# Patient Record
Sex: Female | Born: 1955 | Race: White | Hispanic: No | Marital: Married | Smoking: Former smoker
Health system: Southern US, Community
[De-identification: ages and names within clinical notes are randomized; demographics above are authoritative.]

## PROBLEM LIST (undated history)

## (undated) DIAGNOSIS — I341 Nonrheumatic mitral (valve) prolapse: Secondary | ICD-10-CM

## (undated) DIAGNOSIS — E039 Hypothyroidism, unspecified: Secondary | ICD-10-CM

## (undated) DIAGNOSIS — Z992 Dependence on renal dialysis: Secondary | ICD-10-CM

## (undated) DIAGNOSIS — M797 Fibromyalgia: Secondary | ICD-10-CM

## (undated) DIAGNOSIS — E11319 Type 2 diabetes mellitus with unspecified diabetic retinopathy without macular edema: Secondary | ICD-10-CM

## (undated) DIAGNOSIS — R609 Edema, unspecified: Secondary | ICD-10-CM

## (undated) DIAGNOSIS — M810 Age-related osteoporosis without current pathological fracture: Secondary | ICD-10-CM

## (undated) DIAGNOSIS — D649 Anemia, unspecified: Secondary | ICD-10-CM

## (undated) DIAGNOSIS — N189 Chronic kidney disease, unspecified: Secondary | ICD-10-CM

## (undated) DIAGNOSIS — R519 Headache, unspecified: Secondary | ICD-10-CM

## (undated) DIAGNOSIS — T8859XA Other complications of anesthesia, initial encounter: Secondary | ICD-10-CM

## (undated) DIAGNOSIS — I509 Heart failure, unspecified: Secondary | ICD-10-CM

## (undated) DIAGNOSIS — B6000 Babesiosis, unspecified: Secondary | ICD-10-CM

## (undated) DIAGNOSIS — I219 Acute myocardial infarction, unspecified: Secondary | ICD-10-CM

## (undated) DIAGNOSIS — R51 Headache: Secondary | ICD-10-CM

## (undated) DIAGNOSIS — K219 Gastro-esophageal reflux disease without esophagitis: Secondary | ICD-10-CM

## (undated) DIAGNOSIS — E119 Type 2 diabetes mellitus without complications: Secondary | ICD-10-CM

## (undated) DIAGNOSIS — F32A Depression, unspecified: Secondary | ICD-10-CM

## (undated) DIAGNOSIS — M1990 Unspecified osteoarthritis, unspecified site: Secondary | ICD-10-CM

## (undated) DIAGNOSIS — D631 Anemia in chronic kidney disease: Secondary | ICD-10-CM

## (undated) DIAGNOSIS — N186 End stage renal disease: Secondary | ICD-10-CM

## (undated) DIAGNOSIS — G629 Polyneuropathy, unspecified: Secondary | ICD-10-CM

## (undated) DIAGNOSIS — G4730 Sleep apnea, unspecified: Secondary | ICD-10-CM

## (undated) DIAGNOSIS — Z8489 Family history of other specified conditions: Secondary | ICD-10-CM

## (undated) DIAGNOSIS — A6920 Lyme disease, unspecified: Secondary | ICD-10-CM

## (undated) DIAGNOSIS — I639 Cerebral infarction, unspecified: Secondary | ICD-10-CM

## (undated) DIAGNOSIS — K589 Irritable bowel syndrome without diarrhea: Secondary | ICD-10-CM

## (undated) DIAGNOSIS — R112 Nausea with vomiting, unspecified: Secondary | ICD-10-CM

## (undated) DIAGNOSIS — I2510 Atherosclerotic heart disease of native coronary artery without angina pectoris: Secondary | ICD-10-CM

## (undated) DIAGNOSIS — I739 Peripheral vascular disease, unspecified: Secondary | ICD-10-CM

## (undated) DIAGNOSIS — J349 Unspecified disorder of nose and nasal sinuses: Secondary | ICD-10-CM

## (undated) DIAGNOSIS — F329 Major depressive disorder, single episode, unspecified: Secondary | ICD-10-CM

## (undated) DIAGNOSIS — K3184 Gastroparesis: Secondary | ICD-10-CM

## (undated) DIAGNOSIS — Z9889 Other specified postprocedural states: Secondary | ICD-10-CM

## (undated) HISTORY — PX: CORONARY ARTERY BYPASS GRAFT: SHX141

## (undated) HISTORY — PX: CARDIAC CATHETERIZATION: SHX172

## (undated) HISTORY — PX: NASAL SEPTUM SURGERY: SHX37

## (undated) HISTORY — PX: CATARACT EXTRACTION W/ INTRAOCULAR LENS IMPLANT: SHX1309

## (undated) HISTORY — PX: TRIGGER FINGER RELEASE: SHX641

## (undated) HISTORY — PX: COLONOSCOPY W/ BIOPSIES AND POLYPECTOMY: SHX1376

## (undated) HISTORY — PX: ABDOMINAL HYSTERECTOMY: SHX81

## (undated) HISTORY — PX: ECTOPIC PREGNANCY SURGERY: SHX613

## (undated) HISTORY — PX: OTHER SURGICAL HISTORY: SHX169

## (undated) HISTORY — PX: APPENDECTOMY: SHX54

## (undated) HISTORY — PX: CARPAL TUNNEL RELEASE: SHX101

## (undated) HISTORY — PX: BREAST SURGERY: SHX581

---
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
## 2013-04-08 DIAGNOSIS — N183 Chronic kidney disease, stage 3 unspecified: Secondary | ICD-10-CM | POA: Insufficient documentation

## 2013-04-08 DIAGNOSIS — E1029 Type 1 diabetes mellitus with other diabetic kidney complication: Secondary | ICD-10-CM | POA: Insufficient documentation

## 2013-04-08 DIAGNOSIS — E1010 Type 1 diabetes mellitus with ketoacidosis without coma: Secondary | ICD-10-CM | POA: Insufficient documentation

## 2013-04-08 DIAGNOSIS — D638 Anemia in other chronic diseases classified elsewhere: Secondary | ICD-10-CM | POA: Insufficient documentation

## 2013-04-08 DIAGNOSIS — E1022 Type 1 diabetes mellitus with diabetic chronic kidney disease: Secondary | ICD-10-CM | POA: Insufficient documentation

## 2013-04-08 DIAGNOSIS — D649 Anemia, unspecified: Secondary | ICD-10-CM | POA: Insufficient documentation

## 2013-04-09 DIAGNOSIS — E1143 Type 2 diabetes mellitus with diabetic autonomic (poly)neuropathy: Secondary | ICD-10-CM | POA: Insufficient documentation

## 2013-04-09 DIAGNOSIS — IMO0002 Reserved for concepts with insufficient information to code with codable children: Secondary | ICD-10-CM | POA: Insufficient documentation

## 2013-04-09 DIAGNOSIS — Z8639 Personal history of other endocrine, nutritional and metabolic disease: Secondary | ICD-10-CM | POA: Insufficient documentation

## 2013-04-09 DIAGNOSIS — R197 Diarrhea, unspecified: Secondary | ICD-10-CM | POA: Insufficient documentation

## 2013-04-09 DIAGNOSIS — K580 Irritable bowel syndrome with diarrhea: Secondary | ICD-10-CM | POA: Insufficient documentation

## 2013-07-14 DIAGNOSIS — I6350 Cerebral infarction due to unspecified occlusion or stenosis of unspecified cerebral artery: Secondary | ICD-10-CM | POA: Insufficient documentation

## 2013-08-02 DIAGNOSIS — E034 Atrophy of thyroid (acquired): Secondary | ICD-10-CM | POA: Insufficient documentation

## 2013-08-02 DIAGNOSIS — E039 Hypothyroidism, unspecified: Secondary | ICD-10-CM | POA: Insufficient documentation

## 2013-08-26 DIAGNOSIS — E1069 Type 1 diabetes mellitus with other specified complication: Secondary | ICD-10-CM | POA: Insufficient documentation

## 2013-08-26 DIAGNOSIS — E10649 Type 1 diabetes mellitus with hypoglycemia without coma: Secondary | ICD-10-CM | POA: Insufficient documentation

## 2013-08-26 DIAGNOSIS — E113593 Type 2 diabetes mellitus with proliferative diabetic retinopathy without macular edema, bilateral: Secondary | ICD-10-CM | POA: Insufficient documentation

## 2013-08-26 DIAGNOSIS — E1065 Type 1 diabetes mellitus with hyperglycemia: Secondary | ICD-10-CM | POA: Insufficient documentation

## 2013-08-26 DIAGNOSIS — E1336 Other specified diabetes mellitus with diabetic cataract: Secondary | ICD-10-CM | POA: Insufficient documentation

## 2013-08-26 DIAGNOSIS — E870 Hyperosmolality and hypernatremia: Secondary | ICD-10-CM | POA: Insufficient documentation

## 2013-08-26 DIAGNOSIS — E1339 Other specified diabetes mellitus with other diabetic ophthalmic complication: Secondary | ICD-10-CM | POA: Insufficient documentation

## 2013-09-18 DIAGNOSIS — I5032 Chronic diastolic (congestive) heart failure: Secondary | ICD-10-CM | POA: Insufficient documentation

## 2013-11-14 DIAGNOSIS — M810 Age-related osteoporosis without current pathological fracture: Secondary | ICD-10-CM | POA: Insufficient documentation

## 2013-12-16 DIAGNOSIS — N183 Chronic kidney disease, stage 3 unspecified: Secondary | ICD-10-CM | POA: Insufficient documentation

## 2013-12-16 DIAGNOSIS — D631 Anemia in chronic kidney disease: Secondary | ICD-10-CM | POA: Insufficient documentation

## 2014-01-07 DIAGNOSIS — M5134 Other intervertebral disc degeneration, thoracic region: Secondary | ICD-10-CM | POA: Insufficient documentation

## 2014-03-03 DIAGNOSIS — H4312 Vitreous hemorrhage, left eye: Secondary | ICD-10-CM | POA: Insufficient documentation

## 2014-03-11 DIAGNOSIS — M4722 Other spondylosis with radiculopathy, cervical region: Secondary | ICD-10-CM | POA: Insufficient documentation

## 2014-03-11 DIAGNOSIS — M5030 Other cervical disc degeneration, unspecified cervical region: Secondary | ICD-10-CM | POA: Insufficient documentation

## 2014-03-11 DIAGNOSIS — M4312 Spondylolisthesis, cervical region: Secondary | ICD-10-CM | POA: Insufficient documentation

## 2014-11-04 ENCOUNTER — Other Ambulatory Visit: Payer: Self-pay | Admitting: Orthopedic Surgery

## 2014-11-04 ENCOUNTER — Encounter (HOSPITAL_COMMUNITY): Payer: Self-pay | Admitting: *Deleted

## 2014-11-04 NOTE — Progress Notes (Signed)
Pt currently denies SOB but stated that she experiences chest pain " periodically."  Pt stated that she last had chest pain " a week ago yesterday." Pt is under the care of Dr. Erline Levine, cardiology, of [place] with Dr. Jillyn Hidden, anesthesia,  regarding pt significant cardiac history and he advised that the pt needs cardiac clearance. Dr. Linna Caprice, anesthesia, advised to have pt report in the morning, get pt records and have [place], [place], anesthesia, to review records. Request made for cardiac records from [facility]. Pt advised to stop otc vitamins, herbal medications and NSAID's. Pt verbalized understanding of all pre-op instructions. Pt stated that she would not be able to arrive before noon because her husband will not get off until 3:00A.M.

## 2014-11-05 ENCOUNTER — Ambulatory Visit (HOSPITAL_COMMUNITY): Payer: Medicare Other | Admitting: Vascular Surgery

## 2014-11-05 ENCOUNTER — Encounter (HOSPITAL_COMMUNITY): Payer: Self-pay | Admitting: *Deleted

## 2014-11-05 ENCOUNTER — Ambulatory Visit (HOSPITAL_COMMUNITY)
Admission: RE | Admit: 2014-11-05 | Discharge: 2014-11-06 | Disposition: A | Discharge status: Home / Self Care | Payer: Medicare Other | Source: Ambulatory Visit | Attending: Orthopedic Surgery | Admitting: Orthopedic Surgery

## 2014-11-05 ENCOUNTER — Encounter (HOSPITAL_COMMUNITY)
Admission: RE | Disposition: A | Discharge status: Home / Self Care | Payer: Self-pay | Source: Ambulatory Visit | Attending: Orthopedic Surgery

## 2014-11-05 DIAGNOSIS — X58XXXA Exposure to other specified factors, initial encounter: Secondary | ICD-10-CM | POA: Insufficient documentation

## 2014-11-05 DIAGNOSIS — M1990 Unspecified osteoarthritis, unspecified site: Secondary | ICD-10-CM | POA: Diagnosis not present

## 2014-11-05 DIAGNOSIS — S52692A Other fracture of lower end of left ulna, initial encounter for closed fracture: Secondary | ICD-10-CM | POA: Insufficient documentation

## 2014-11-05 DIAGNOSIS — M797 Fibromyalgia: Secondary | ICD-10-CM | POA: Insufficient documentation

## 2014-11-05 DIAGNOSIS — Y998 Other external cause status: Secondary | ICD-10-CM | POA: Diagnosis not present

## 2014-11-05 DIAGNOSIS — I341 Nonrheumatic mitral (valve) prolapse: Secondary | ICD-10-CM | POA: Diagnosis not present

## 2014-11-05 DIAGNOSIS — N183 Chronic kidney disease, stage 3 (moderate): Secondary | ICD-10-CM | POA: Diagnosis not present

## 2014-11-05 DIAGNOSIS — K589 Irritable bowel syndrome without diarrhea: Secondary | ICD-10-CM | POA: Insufficient documentation

## 2014-11-05 DIAGNOSIS — E1122 Type 2 diabetes mellitus with diabetic chronic kidney disease: Secondary | ICD-10-CM | POA: Insufficient documentation

## 2014-11-05 DIAGNOSIS — Z885 Allergy status to narcotic agent status: Secondary | ICD-10-CM | POA: Diagnosis not present

## 2014-11-05 DIAGNOSIS — Z87891 Personal history of nicotine dependence: Secondary | ICD-10-CM | POA: Diagnosis not present

## 2014-11-05 DIAGNOSIS — Z8673 Personal history of transient ischemic attack (TIA), and cerebral infarction without residual deficits: Secondary | ICD-10-CM | POA: Insufficient documentation

## 2014-11-05 DIAGNOSIS — Z794 Long term (current) use of insulin: Secondary | ICD-10-CM | POA: Diagnosis not present

## 2014-11-05 DIAGNOSIS — M810 Age-related osteoporosis without current pathological fracture: Secondary | ICD-10-CM | POA: Diagnosis not present

## 2014-11-05 DIAGNOSIS — Z9641 Presence of insulin pump (external) (internal): Secondary | ICD-10-CM | POA: Diagnosis not present

## 2014-11-05 DIAGNOSIS — F329 Major depressive disorder, single episode, unspecified: Secondary | ICD-10-CM | POA: Diagnosis not present

## 2014-11-05 DIAGNOSIS — S52592A Other fractures of lower end of left radius, initial encounter for closed fracture: Secondary | ICD-10-CM | POA: Diagnosis not present

## 2014-11-05 DIAGNOSIS — I252 Old myocardial infarction: Secondary | ICD-10-CM | POA: Insufficient documentation

## 2014-11-05 DIAGNOSIS — Z880 Allergy status to penicillin: Secondary | ICD-10-CM | POA: Diagnosis not present

## 2014-11-05 DIAGNOSIS — S52502A Unspecified fracture of the lower end of left radius, initial encounter for closed fracture: Secondary | ICD-10-CM | POA: Diagnosis present

## 2014-11-05 DIAGNOSIS — I25118 Atherosclerotic heart disease of native coronary artery with other forms of angina pectoris: Secondary | ICD-10-CM

## 2014-11-05 DIAGNOSIS — R079 Chest pain, unspecified: Secondary | ICD-10-CM | POA: Insufficient documentation

## 2014-11-05 DIAGNOSIS — Y9389 Activity, other specified: Secondary | ICD-10-CM | POA: Diagnosis not present

## 2014-11-05 DIAGNOSIS — Z01810 Encounter for preprocedural cardiovascular examination: Secondary | ICD-10-CM | POA: Diagnosis not present

## 2014-11-05 DIAGNOSIS — Z882 Allergy status to sulfonamides status: Secondary | ICD-10-CM | POA: Insufficient documentation

## 2014-11-05 DIAGNOSIS — E039 Hypothyroidism, unspecified: Secondary | ICD-10-CM | POA: Insufficient documentation

## 2014-11-05 DIAGNOSIS — E1143 Type 2 diabetes mellitus with diabetic autonomic (poly)neuropathy: Secondary | ICD-10-CM | POA: Diagnosis not present

## 2014-11-05 DIAGNOSIS — I2510 Atherosclerotic heart disease of native coronary artery without angina pectoris: Secondary | ICD-10-CM | POA: Diagnosis not present

## 2014-11-05 DIAGNOSIS — E109 Type 1 diabetes mellitus without complications: Secondary | ICD-10-CM

## 2014-11-05 DIAGNOSIS — E1142 Type 2 diabetes mellitus with diabetic polyneuropathy: Secondary | ICD-10-CM | POA: Insufficient documentation

## 2014-11-05 DIAGNOSIS — I739 Peripheral vascular disease, unspecified: Secondary | ICD-10-CM | POA: Insufficient documentation

## 2014-11-05 DIAGNOSIS — K3184 Gastroparesis: Secondary | ICD-10-CM | POA: Diagnosis not present

## 2014-11-05 DIAGNOSIS — K219 Gastro-esophageal reflux disease without esophagitis: Secondary | ICD-10-CM | POA: Insufficient documentation

## 2014-11-05 DIAGNOSIS — Y9289 Other specified places as the place of occurrence of the external cause: Secondary | ICD-10-CM | POA: Insufficient documentation

## 2014-11-05 DIAGNOSIS — E11319 Type 2 diabetes mellitus with unspecified diabetic retinopathy without macular edema: Secondary | ICD-10-CM | POA: Diagnosis not present

## 2014-11-05 DIAGNOSIS — N189 Chronic kidney disease, unspecified: Secondary | ICD-10-CM | POA: Diagnosis not present

## 2014-11-05 DIAGNOSIS — Z951 Presence of aortocoronary bypass graft: Secondary | ICD-10-CM | POA: Diagnosis not present

## 2014-11-05 DIAGNOSIS — Z955 Presence of coronary angioplasty implant and graft: Secondary | ICD-10-CM

## 2014-11-05 DIAGNOSIS — Z888 Allergy status to other drugs, medicaments and biological substances status: Secondary | ICD-10-CM | POA: Insufficient documentation

## 2014-11-05 HISTORY — DX: Nonrheumatic mitral (valve) prolapse: I34.1

## 2014-11-05 HISTORY — DX: Headache: R51

## 2014-11-05 HISTORY — DX: Age-related osteoporosis without current pathological fracture: M81.0

## 2014-11-05 HISTORY — DX: Babesiosis, unspecified: B60.00

## 2014-11-05 HISTORY — DX: Type 2 diabetes mellitus with unspecified diabetic retinopathy without macular edema: E11.319

## 2014-11-05 HISTORY — DX: Fibromyalgia: M79.7

## 2014-11-05 HISTORY — DX: Family history of other specified conditions: Z84.89

## 2014-11-05 HISTORY — DX: Acute myocardial infarction, unspecified: I21.9

## 2014-11-05 HISTORY — DX: Gastro-esophageal reflux disease without esophagitis: K21.9

## 2014-11-05 HISTORY — DX: Unspecified disorder of nose and nasal sinuses: J34.9

## 2014-11-05 HISTORY — DX: Chronic kidney disease, unspecified: N18.9

## 2014-11-05 HISTORY — DX: Atherosclerotic heart disease of native coronary artery without angina pectoris: I25.10

## 2014-11-05 HISTORY — DX: Irritable bowel syndrome, unspecified: K58.9

## 2014-11-05 HISTORY — DX: Polyneuropathy, unspecified: G62.9

## 2014-11-05 HISTORY — DX: Gastroparesis: K31.84

## 2014-11-05 HISTORY — DX: Depression, unspecified: F32.A

## 2014-11-05 HISTORY — DX: Type 2 diabetes mellitus without complications: E11.9

## 2014-11-05 HISTORY — DX: Anemia, unspecified: D64.9

## 2014-11-05 HISTORY — DX: Hypothyroidism, unspecified: E03.9

## 2014-11-05 HISTORY — DX: Peripheral vascular disease, unspecified: I73.9

## 2014-11-05 HISTORY — DX: Edema, unspecified: R60.9

## 2014-11-05 HISTORY — DX: Lyme disease, unspecified: A69.20

## 2014-11-05 HISTORY — DX: Headache, unspecified: R51.9

## 2014-11-05 HISTORY — PX: ORIF WRIST FRACTURE: SHX2133

## 2014-11-05 HISTORY — DX: Unspecified osteoarthritis, unspecified site: M19.90

## 2014-11-05 HISTORY — DX: Major depressive disorder, single episode, unspecified: F32.9

## 2014-11-05 HISTORY — DX: Cerebral infarction, unspecified: I63.9

## 2014-11-05 HISTORY — DX: Babesiosis: B60.0

## 2014-11-05 LAB — BASIC METABOLIC PANEL
Anion gap: 8 (ref 5–15)
BUN: 18 mg/dL (ref 6–20)
CO2: 28 mmol/L (ref 22–32)
Calcium: 9.2 mg/dL (ref 8.9–10.3)
Chloride: 98 mmol/L — ABNORMAL LOW (ref 101–111)
Creatinine, Ser: 1.17 mg/dL — ABNORMAL HIGH (ref 0.44–1.00)
GFR calc Af Amer: 57 mL/min — ABNORMAL LOW (ref >60)
GFR calc non Af Amer: 50 mL/min — ABNORMAL LOW (ref >60)
Glucose, Bld: 190 mg/dL — ABNORMAL HIGH (ref 65–99)
Potassium: 3.5 mmol/L (ref 3.5–5.1)
Sodium: 134 mmol/L — ABNORMAL LOW (ref 135–145)

## 2014-11-05 LAB — CBC
HCT: 30.2 % — ABNORMAL LOW (ref 36.0–46.0)
Hemoglobin: 9.9 g/dL — ABNORMAL LOW (ref 12.0–15.0)
MCH: 30.1 pg (ref 26.0–34.0)
MCHC: 32.8 g/dL (ref 30.0–36.0)
MCV: 91.8 fL (ref 78.0–100.0)
Platelets: 399 10*3/uL (ref 150–400)
RBC: 3.29 MIL/uL — ABNORMAL LOW (ref 3.87–5.11)
RDW: 12.9 % (ref 11.5–15.5)
WBC: 6.7 10*3/uL (ref 4.0–10.5)

## 2014-11-05 LAB — TYPE AND SCREEN
ABO/RH(D): O NEG
Antibody Screen: NEGATIVE

## 2014-11-05 LAB — ABO/RH: ABO/RH(D): O NEG

## 2014-11-05 LAB — GLUCOSE, CAPILLARY
Glucose-Capillary: 186 mg/dL — ABNORMAL HIGH (ref 65–99)
Glucose-Capillary: 101 mg/dL — ABNORMAL HIGH (ref 65–99)
Glucose-Capillary: 103 mg/dL — ABNORMAL HIGH (ref 65–99)
Glucose-Capillary: 55 mg/dL — ABNORMAL LOW (ref 65–99)
Glucose-Capillary: 118 mg/dL — ABNORMAL HIGH (ref 65–99)

## 2014-11-05 SURGERY — OPEN REDUCTION INTERNAL FIXATION (ORIF) WRIST FRACTURE
Anesthesia: Monitor Anesthesia Care | Site: Wrist | Laterality: Left

## 2014-11-05 MED ORDER — LACTATED RINGERS IV SOLN
INTRAVENOUS | Status: DC
  Administered 2014-11-05: 23:00:00 via INTRAVENOUS

## 2014-11-05 MED ORDER — VANCOMYCIN HCL IN DEXTROSE 1-5 GM/200ML-% IV SOLN: 1000.0000 mg | INTRAVENOUS | Status: DC

## 2014-11-05 MED ORDER — MORPHINE SULFATE (PF) 2 MG/ML IV SOLN
1.0000 mg | INTRAVENOUS | Status: DC | PRN
  Administered 2014-11-06: 1 mg via INTRAVENOUS
  Filled 2014-11-05: qty 1

## 2014-11-05 MED ORDER — VITAMIN A 25000 UNITS PO CAPS: 50000.0000 [IU] | ORAL_CAPSULE | Freq: Every day | ORAL | Status: DC

## 2014-11-05 MED ORDER — POTASSIUM CHLORIDE CRYS ER 10 MEQ PO TBCR
10.0000 meq | EXTENDED_RELEASE_TABLET | Freq: Two times a day (BID) | ORAL | Status: DC
  Filled 2014-11-05 (×3): qty 1

## 2014-11-05 MED ORDER — 0.9 % SODIUM CHLORIDE (POUR BTL) OPTIME
TOPICAL | Status: DC | PRN
  Administered 2014-11-05: 1000 mL

## 2014-11-05 MED ORDER — ACYCLOVIR 400 MG PO TABS
400.0000 mg | ORAL_TABLET | Freq: Two times a day (BID) | ORAL | Status: DC
  Administered 2014-11-05 – 2014-11-06 (×2): 400 mg via ORAL
  Filled 2014-11-05 (×3): qty 1

## 2014-11-05 MED ORDER — METHOCARBAMOL 500 MG PO TABS: 500.0000 mg | ORAL_TABLET | Freq: Four times a day (QID) | ORAL | Status: DC

## 2014-11-05 MED ORDER — FENTANYL CITRATE (PF) 250 MCG/5ML IJ SOLN
INTRAMUSCULAR | Status: AC
  Filled 2014-11-05: qty 5

## 2014-11-05 MED ORDER — VITAMIN C 500 MG PO TABS
1000.0000 mg | ORAL_TABLET | Freq: Every day | ORAL | Status: DC
  Administered 2014-11-06: 1000 mg via ORAL
  Filled 2014-11-05: qty 2

## 2014-11-05 MED ORDER — PROPOFOL 500 MG/50ML IV EMUL
INTRAVENOUS | Status: DC | PRN
  Administered 2014-11-05: 150 ug/kg/min via INTRAVENOUS

## 2014-11-05 MED ORDER — ONDANSETRON HCL 4 MG/2ML IJ SOLN
4.0000 mg | Freq: Four times a day (QID) | INTRAMUSCULAR | Status: DC | PRN
  Administered 2014-11-06 (×2): 4 mg via INTRAVENOUS
  Filled 2014-11-05 (×2): qty 2

## 2014-11-05 MED ORDER — ITRACONAZOLE 100 MG PO CAPS
100.0000 mg | ORAL_CAPSULE | Freq: Every day | ORAL | Status: DC
  Filled 2014-11-05 (×2): qty 1

## 2014-11-05 MED ORDER — CHLORHEXIDINE GLUCONATE 4 % EX LIQD: 60.0000 mL | Freq: Once | CUTANEOUS | Status: DC

## 2014-11-05 MED ORDER — SPIRULINA 500 MG PO TABS: 500.0000 mg | ORAL_TABLET | Freq: Every day | ORAL | Status: DC

## 2014-11-05 MED ORDER — LACTATED RINGERS IV SOLN
INTRAVENOUS | Status: DC | PRN
  Administered 2014-11-05: 19:00:00 via INTRAVENOUS

## 2014-11-05 MED ORDER — CHLORELLA 500 MG PO CAPS: 75.0000 mg | ORAL_CAPSULE | Freq: Every day | ORAL | Status: DC

## 2014-11-05 MED ORDER — LEVOTHYROXINE SODIUM 25 MCG PO TABS
25.0000 ug | ORAL_TABLET | Freq: Every day | ORAL | Status: DC
  Administered 2014-11-06: 25 ug via ORAL
  Filled 2014-11-05 (×2): qty 1

## 2014-11-05 MED ORDER — PANTOPRAZOLE SODIUM 40 MG PO TBEC
40.0000 mg | DELAYED_RELEASE_TABLET | Freq: Every day | ORAL | Status: DC
  Administered 2014-11-06: 40 mg via ORAL
  Filled 2014-11-05 (×2): qty 1

## 2014-11-05 MED ORDER — MEPIVACAINE HCL 1.5 % IJ SOLN
INTRAMUSCULAR | Status: DC | PRN
  Administered 2014-11-05: 10 mL via PERINEURAL

## 2014-11-05 MED ORDER — ADULT MULTIVITAMIN W/MINERALS CH
1.0000 | ORAL_TABLET | Freq: Every day | ORAL | Status: DC
  Administered 2014-11-06: 1 via ORAL
  Filled 2014-11-05: qty 1

## 2014-11-05 MED ORDER — MIDAZOLAM HCL 2 MG/2ML IJ SOLN
INTRAMUSCULAR | Status: AC
  Filled 2014-11-05: qty 4

## 2014-11-05 MED ORDER — SODIUM CHLORIDE 0.9 % IV SOLN
INTRAVENOUS | Status: DC
  Administered 2014-11-05: 15:00:00 via INTRAVENOUS

## 2014-11-05 MED ORDER — VITAMIN C 500 MG PO TABS: 500.0000 mg | ORAL_TABLET | Freq: Every day | ORAL | Status: DC

## 2014-11-05 MED ORDER — MIDAZOLAM HCL 5 MG/5ML IJ SOLN
INTRAMUSCULAR | Status: DC | PRN
  Administered 2014-11-05 (×2): 1 mg via INTRAVENOUS

## 2014-11-05 MED ORDER — ONDANSETRON HCL 4 MG PO TABS: 4.0000 mg | ORAL_TABLET | Freq: Three times a day (TID) | ORAL | Status: DC | PRN

## 2014-11-05 MED ORDER — TORSEMIDE 20 MG PO TABS
20.0000 mg | ORAL_TABLET | Freq: Every day | ORAL | Status: DC
  Administered 2014-11-06: 20 mg via ORAL
  Filled 2014-11-05: qty 1

## 2014-11-05 MED ORDER — OXYCODONE-ACETAMINOPHEN 5-325 MG PO TABS: 1.0000 | ORAL_TABLET | ORAL | Status: DC | PRN

## 2014-11-05 MED ORDER — NITROGLYCERIN 0.4 MG/SPRAY TL SOLN: 1.0000 | Status: DC | PRN

## 2014-11-05 MED ORDER — VANCOMYCIN HCL IN DEXTROSE 750-5 MG/150ML-% IV SOLN
750.0000 mg | INTRAVENOUS | Status: DC
  Administered 2014-11-06: 750 mg via INTRAVENOUS
  Filled 2014-11-05: qty 150

## 2014-11-05 MED ORDER — METHOCARBAMOL 1000 MG/10ML IJ SOLN
500.0000 mg | Freq: Four times a day (QID) | INTRAVENOUS | Status: DC | PRN
  Filled 2014-11-05: qty 5

## 2014-11-05 MED ORDER — ZOLPIDEM TARTRATE 5 MG PO TABS: 5.0000 mg | ORAL_TABLET | Freq: Every evening | ORAL | Status: DC | PRN

## 2014-11-05 MED ORDER — FENTANYL CITRATE (PF) 100 MCG/2ML IJ SOLN: 25.0000 ug | INTRAMUSCULAR | Status: DC | PRN

## 2014-11-05 MED ORDER — MIDAZOLAM HCL 2 MG/2ML IJ SOLN
INTRAMUSCULAR | Status: AC
  Administered 2014-11-05: 1 mg
  Filled 2014-11-05: qty 2

## 2014-11-05 MED ORDER — FENTANYL CITRATE (PF) 100 MCG/2ML IJ SOLN
INTRAMUSCULAR | Status: AC
  Administered 2014-11-05: 100 ug
  Filled 2014-11-05: qty 2

## 2014-11-05 MED ORDER — RESVERATROL 50 MG PO CAPS: 50.0000 mg | ORAL_CAPSULE | Freq: Every day | ORAL | Status: DC

## 2014-11-05 MED ORDER — NITROGLYCERIN 0.4 MG SL SUBL: 0.4000 mg | SUBLINGUAL_TABLET | SUBLINGUAL | Status: DC | PRN

## 2014-11-05 MED ORDER — LORATADINE 10 MG PO TABS
10.0000 mg | ORAL_TABLET | Freq: Every day | ORAL | Status: DC
Start: 2014-11-05 — End: 2014-11-06
  Administered 2014-11-06: 10 mg via ORAL
  Filled 2014-11-05 (×2): qty 1

## 2014-11-05 MED ORDER — DOCUSATE SODIUM 100 MG PO CAPS
100.0000 mg | ORAL_CAPSULE | Freq: Two times a day (BID) | ORAL | Status: DC
  Administered 2014-11-06: 100 mg via ORAL
  Filled 2014-11-05: qty 1

## 2014-11-05 MED ORDER — HYDROCODONE-ACETAMINOPHEN 5-325 MG PO TABS: 1.0000 | ORAL_TABLET | ORAL | Status: DC | PRN

## 2014-11-05 MED ORDER — METHOCARBAMOL 500 MG PO TABS
500.0000 mg | ORAL_TABLET | Freq: Four times a day (QID) | ORAL | Status: DC | PRN
  Administered 2014-11-06 (×2): 500 mg via ORAL
  Filled 2014-11-05 (×2): qty 1

## 2014-11-05 MED ORDER — DEXTROSE 50 % IV SOLN
INTRAVENOUS | Status: AC
  Administered 2014-11-05: 25 mL
  Filled 2014-11-05: qty 50

## 2014-11-05 MED ORDER — INSULIN PUMP
SUBCUTANEOUS | Status: DC
  Administered 2014-11-05: 23:00:00 via SUBCUTANEOUS
  Filled 2014-11-05: qty 1

## 2014-11-05 MED ORDER — CARVEDILOL 3.125 MG PO TABS
3.1250 mg | ORAL_TABLET | Freq: Two times a day (BID) | ORAL | Status: DC
  Administered 2014-11-05 – 2014-11-06 (×2): 3.125 mg via ORAL
  Filled 2014-11-05 (×3): qty 1

## 2014-11-05 MED ORDER — OXYCODONE-ACETAMINOPHEN 5-325 MG PO TABS
1.0000 | ORAL_TABLET | ORAL | Status: DC | PRN
  Administered 2014-11-05: 2 via ORAL
  Administered 2014-11-06: 1 via ORAL
  Administered 2014-11-06: 2 via ORAL
  Filled 2014-11-05 (×3): qty 2

## 2014-11-05 MED ORDER — ISOSORBIDE MONONITRATE ER 60 MG PO TB24
120.0000 mg | ORAL_TABLET | Freq: Every day | ORAL | Status: DC
  Administered 2014-11-05: 120 mg via ORAL
  Filled 2014-11-05 (×2): qty 2

## 2014-11-05 MED ORDER — DIPHENHYDRAMINE HCL 25 MG PO CAPS: 25.0000 mg | ORAL_CAPSULE | Freq: Four times a day (QID) | ORAL | Status: DC | PRN

## 2014-11-05 MED ORDER — MELATONIN 500 MCG PO TBDP: 500.0000 ug | ORAL_TABLET | Freq: Every evening | ORAL | Status: DC | PRN

## 2014-11-05 MED ORDER — ONDANSETRON HCL 4 MG PO TABS: 4.0000 mg | ORAL_TABLET | Freq: Four times a day (QID) | ORAL | Status: DC | PRN

## 2014-11-05 MED ORDER — DOCUSATE SODIUM 100 MG PO CAPS: 100.0000 mg | ORAL_CAPSULE | Freq: Two times a day (BID) | ORAL | Status: DC

## 2014-11-05 MED ORDER — EPHEDRINE SULFATE 50 MG/ML IJ SOLN
INTRAMUSCULAR | Status: DC | PRN
  Administered 2014-11-05: 10 mg via INTRAVENOUS

## 2014-11-05 MED ORDER — VANCOMYCIN HCL IN DEXTROSE 1-5 GM/200ML-% IV SOLN
INTRAVENOUS | Status: AC
  Administered 2014-11-05: 1000 mg via INTRAVENOUS
  Filled 2014-11-05: qty 200

## 2014-11-05 MED ORDER — ROPIVACAINE HCL 5 MG/ML IJ SOLN
INTRAMUSCULAR | Status: DC | PRN
  Administered 2014-11-05: 30 mL via PERINEURAL

## 2014-11-05 MED ORDER — LINOLEIC ACID CONJUGATED 1000 MG PO CAPS: 1000.0000 mg | ORAL_CAPSULE | Freq: Every day | ORAL | Status: DC

## 2014-11-05 SURGICAL SUPPLY — 59 items
BANDAGE ELASTIC 3 VELCRO ST LF (GAUZE/BANDAGES/DRESSINGS) ×2 IMPLANT
BANDAGE ELASTIC 4 VELCRO ST LF (GAUZE/BANDAGES/DRESSINGS) ×2 IMPLANT
BIT DRILL 2.2 SS TIBIAL (BIT) ×2 IMPLANT
BLADE SURG ROTATE 9660 (MISCELLANEOUS) IMPLANT
BNDG ESMARK 4X9 LF (GAUZE/BANDAGES/DRESSINGS) ×2 IMPLANT
BNDG GAUZE ELAST 4 BULKY (GAUZE/BANDAGES/DRESSINGS) ×2 IMPLANT
CORDS BIPOLAR (ELECTRODE) ×2 IMPLANT
COVER SURGICAL LIGHT HANDLE (MISCELLANEOUS) ×2 IMPLANT
CUFF TOURNIQUET SINGLE 18IN (TOURNIQUET CUFF) ×2 IMPLANT
CUFF TOURNIQUET SINGLE 24IN (TOURNIQUET CUFF) IMPLANT
DRAIN TLS ROUND 10FR (DRAIN) IMPLANT
DRAPE OEC MINIVIEW 54X84 (DRAPES) ×2 IMPLANT
DRAPE SURG 17X11 SM STRL (DRAPES) ×2 IMPLANT
DRSG ADAPTIC 3X8 NADH LF (GAUZE/BANDAGES/DRESSINGS) ×2 IMPLANT
ELECT REM PT RETURN 9FT ADLT (ELECTROSURGICAL)
ELECTRODE REM PT RTRN 9FT ADLT (ELECTROSURGICAL) IMPLANT
GAUZE SPONGE 4X4 12PLY STRL (GAUZE/BANDAGES/DRESSINGS) ×2 IMPLANT
GLOVE BIOGEL PI IND STRL 8.5 (GLOVE) ×1 IMPLANT
GLOVE BIOGEL PI INDICATOR 8.5 (GLOVE) ×1
GLOVE SURG ORTHO 8.0 STRL STRW (GLOVE) ×2 IMPLANT
GOWN STRL REUS W/ TWL LRG LVL3 (GOWN DISPOSABLE) ×3 IMPLANT
GOWN STRL REUS W/ TWL XL LVL3 (GOWN DISPOSABLE) ×1 IMPLANT
GOWN STRL REUS W/TWL LRG LVL3 (GOWN DISPOSABLE) ×3
GOWN STRL REUS W/TWL XL LVL3 (GOWN DISPOSABLE) ×1
KIT BASIN OR (CUSTOM PROCEDURE TRAY) ×2 IMPLANT
KIT ROOM TURNOVER OR (KITS) ×2 IMPLANT
MANIFOLD NEPTUNE II (INSTRUMENTS) ×2 IMPLANT
NEEDLE HYPO 25X1 1.5 SAFETY (NEEDLE) ×2 IMPLANT
NS IRRIG 1000ML POUR BTL (IV SOLUTION) ×2 IMPLANT
PACK ORTHO EXTREMITY (CUSTOM PROCEDURE TRAY) ×2 IMPLANT
PAD ARMBOARD 7.5X6 YLW CONV (MISCELLANEOUS) ×4 IMPLANT
PAD CAST 4YDX4 CTTN HI CHSV (CAST SUPPLIES) ×1 IMPLANT
PADDING CAST ABS 3INX4YD NS (CAST SUPPLIES) ×1
PADDING CAST ABS 4INX4YD NS (CAST SUPPLIES) ×1
PADDING CAST ABS COTTON 3X4 (CAST SUPPLIES) ×1 IMPLANT
PADDING CAST ABS COTTON 4X4 ST (CAST SUPPLIES) ×1 IMPLANT
PADDING CAST COTTON 4X4 STRL (CAST SUPPLIES) ×1
PEG LOCKING SMOOTH 2.2X15 (Peg) ×2 IMPLANT
PEG LOCKING SMOOTH 2.2X16 (Screw) ×6 IMPLANT
PEG LOCKING SMOOTH 2.2X18 (Peg) ×6 IMPLANT
PEG LOCKING SMOOTH 2.2X20 (Screw) ×4 IMPLANT
PLATE NARROW DVR LEFT (Plate) ×2 IMPLANT
SCREW LOCK 12X2.7X 3 LD (Screw) ×2 IMPLANT
SCREW LOCK 14X2.7X 3 LD TPR (Screw) ×1 IMPLANT
SCREW LOCKING 2.7X12MM (Screw) ×2 IMPLANT
SCREW LOCKING 2.7X13MM (Screw) ×4 IMPLANT
SCREW LOCKING 2.7X14 (Screw) ×1 IMPLANT
SOAP 2 % CHG 4 OZ (WOUND CARE) ×2 IMPLANT
SPLINT FIBERGLASS 3X12 (CAST SUPPLIES) ×2 IMPLANT
SPONGE GAUZE 4X4 12PLY STER LF (GAUZE/BANDAGES/DRESSINGS) ×2 IMPLANT
SUT PROLENE 4 0 PS 2 18 (SUTURE) IMPLANT
SUT VIC AB 2-0 FS1 27 (SUTURE) IMPLANT
SUT VICRYL 4-0 PS2 18IN ABS (SUTURE) IMPLANT
SYR CONTROL 10ML LL (SYRINGE) IMPLANT
SYSTEM CHEST DRAIN TLS 7FR (DRAIN) IMPLANT
TOWEL OR 17X24 6PK STRL BLUE (TOWEL DISPOSABLE) ×2 IMPLANT
TOWEL OR 17X26 10 PK STRL BLUE (TOWEL DISPOSABLE) ×2 IMPLANT
TUBE CONNECTING 12X1/4 (SUCTIONS) ×2 IMPLANT
WATER STERILE IRR 1000ML POUR (IV SOLUTION) ×2 IMPLANT

## 2014-11-05 NOTE — Consult Note (Signed)
Cardiologist: Dr. Steward Drone) Reason for Consult: Preop clearance Referring Physician: [name] is an 59 y.o. female.  HPI:   Patient 59 year old female with history of coronary artery disease with MI in 1999, mitral valve prolapse, coronary artery bypass grafting, DVT 1980, diabetes mellitus, chronic kidney disease stage III, Lyme disease, hypothyroidism, GERD, fibromyalgia, stroke  Progressive patient for preoperative clearance. She broke her left arm after getting tangled up with her dog. Patient reports she had an MI in 1999 she underwent coronary artery bypass surgery with 2 grafts at [facility] (LIMA graft to the LAD and SVG to the RCA. According to the patient, she ended up having 2 stents to the LIMA graft and the LAD in 2000. She's had EECP treatments and 2006 and 2007 in [place] Her last heart catheterization was August 2015 at [facility] which revealed two-vessel coronary disease with a patent LIMA to the LAD and SVG to the RCA. Her Imdur was decreased to 120 mg twice daily from twice a day and Coreg was increased to 3.125 twice a day from daily. Her last echocardiogram was August 2015 and she had normal ejection fraction normal LV size and wall thickness. There are no LV wall motion abnormalities, no AI or MR and trace TR. She reports last time she had angina was a week ago. She was not doing anything at the time. She does go up and down stairs in her home and denies any angina with this exertion. His chronically low hemoglobin.  Today it is 9.9 hemoglobin.  EKG shows sinus rhythm with possible left atrial enlargement.  Medications: Medication Sig  acyclovir (ZOVIRAX) 400 MG tablet Take 400 mg by mouth 2 (two) times daily.  Alpha-Lipoic Acid 600 MG CAPS Take 600 mg by mouth daily.   arginine 500 MG tablet Take 500 mg by mouth daily.  aspirin EC 325 MG tablet Take 325 mg by mouth at bedtime.  carvedilol (COREG) 3.125 MG tablet Take 3.125 mg by  mouth 2 (two) times daily with a meal.  cetirizine (ZYRTEC) 10 MG tablet Take 10 mg by mouth at bedtime.  Cholecalciferol (VITAMIN D3) 5000 UNITS TABS Take 5,000 Units by mouth daily.   Cyanocobalamin (VITAMIN B-12) 2500 MCG SUBL Place 2,500 mcg under the tongue daily.  Esomeprazole Magnesium (NEXIUM PO) Take 22.3 mg by mouth 2 (two) times daily.   HYDROmorphone (DILAUDID) 2 MG tablet Take 2 mg by mouth every 6 (six) hours as needed (pain).   Insulin Human (INSULIN PUMP) SOLN Inject into the skin continuous. Novolog insulin  isosorbide mononitrate (IMDUR) 120 MG 24 hr tablet Take 120 mg by mouth at bedtime.  levothyroxine (SYNTHROID, LEVOTHROID) 25 MCG tablet Take 25 mcg by mouth daily before breakfast.  Linoleic Acid Conjugated 1000 MG CAPS Take 1,000 mg by mouth daily.   MAGNESIUM CITRATE PO Take 150 mg by mouth at bedtime.  Melatonin 500 MCG TBDP Take 500 mcg by mouth at bedtime as needed (sleep).   Multiple Vitamins-Iron (CHLORELLA PO) Take 75 mg by mouth daily.  NALTREXONE HCL PO Take 3 mg by mouth at bedtime. Compounded at [place], [place]  nitroGLYCERIN (NITROLINGUAL) 0.4 MG/SPRAY spray Place 1 spray under the tongue every 5 (five) minutes x 3 doses as needed for chest pain.  OIL OF OREGANO PO Take 1 capsule by mouth at bedtime.  OVER THE COUNTER MEDICATION Take 2.9 g by mouth at bedtime. SeroVital HGH  OVER THE COUNTER MEDICATION Take 2 g by  mouth daily. L-glutamine powder  potassium chloride (K-DUR,KLOR-CON) 10 MEQ tablet Take 10 mEq by mouth 2 (two) times daily.  PRESCRIPTION MEDICATION Take 15 mcg by mouth daily. Liothyronine compounded at [employer]  Resveratrol 50 MG CAPS Take 50 mg by mouth daily.   Spirulina 500 MG TABS Take 500 mg by mouth daily.  torsemide ([name]) 20 MG tablet Take 20 mg by mouth See admin instructions. Take 1 tablet (20 mg) by mouth every morning, may take an extra 1/2 tablet (10 mg) as needed for swelling  (usually in hot weather)  vitamin A 25000 UNIT capsule Take 50,000 Units by mouth daily.  VITAMIN B1-B12 IM Inject 5 mg into the muscle every 3 (three) days. Last injection approximately 10/3 or 10/4 (25 mg/ml 0.2 ml syringe)  itraconazole (SPORANOX) 100 MG capsule Take 100 mg by mouth daily.     Past Medical History  Diagnosis Date  . Diabetes mellitus without complication (HCC)     Type 1  . Idiopathic edema   . IBS (irritable bowel syndrome)   . Diabetic retinopathy ([place])   . Anemia   . Chronic kidney disease     stage 3  . Lyme disease   . Babesiasis     secondary due to lyme disease  . Hypothyroidism   . Peripheral neuropathy ([place])   . Mitral valve prolapse   . Osteoporosis   . GERD (gastroesophageal reflux disease)   . Gastroparesis   . Fibromyalgia   . 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 Coronary artery disease   . Peripheral vascular disease ([place])   . Depression   . Headache     migraines  . Arthritis   . Myocardial infarction ([place])     1 major in 1999 and 2 minor " small vessel disease."  . 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 Family history of adverse reaction to anesthesia     mother: " while she was under she stopped breathing."    Past Surgical History  Procedure Laterality Date  . Coronary artery bypass graft    . Cardiac catheterization    . Abdominal hysterectomy    . Coronary artery stents      at LAD and LIMA  . Cataract extraction w/ intraocular lens implant      right eye  . Appendectomy    . Colonoscopy w/ biopsies and polypectomy    . Breast surgery      B/L biopsy and lumpectomy   . Ectopic pregnancy surgery      Family History  Problem Relation Age of Onset  . Breast cancer Mother   . Heart disease Father   . Hypertension Father   . Breast cancer Sister     Social History:  reports that she has quit smoking. Her smoking use included Cigarettes. She  has never used smokeless tobacco. She reports that she drinks alcohol. She reports that she does not use illicit drugs.  Allergies:  Allergies  Allergen Reactions  . Morphine And Related Shortness Of Breath  . Penicillins Hives    Has patient had a PCN reaction causing immediate rash, facial/tongue/throat swelling, SOB or lightheadedness with hypotension: Yes Has patient had a PCN reaction causing severe rash involving mucus membranes or skin necrosis: No Has patient had a PCN reaction that required hospitalization No Has patient had a PCN  reaction occurring within the last 10 years: No If all of the above answers are "NO", then may proceed with Cephalosporin use.  . Amitriptyline Other (See Comments)    Severe headache/ out of body feeling  . Codeine Other (See Comments)    Severe headaches/ out of body feeling  . Darvon [Propoxyphene] Other (See Comments)    Severe headaches  . Mold Extract [Trichophyton] Other (See Comments)    sinusitis  . Other Swelling    Cat hair and scratches cause swelling  . Statins Other (See Comments)    Muscle pains  . Sulfa Antibiotics Other (See Comments)    Mouth ulcers  . Sulfites Other (See Comments)    Mouth ulcers  . Ultracet [Tramadol-Acetaminophen] Other (See Comments)    Small vessel heart attack     Results for orders placed or performed during the hospital encounter of 11/05/14 (from the past 48 hour(s))  Type and screen [place]     Status: None   Collection Time: 11/05/14  2:53 PM  Result Value Ref Range   ABO/RH(D) O NEG    Antibody Screen NEG    Sample Expiration 11/08/2014   CBC     Status: Abnormal   Collection Time: 11/05/14  2:58 PM  Result Value Ref Range   WBC 6.7 4.0 - 10.5 K/uL   RBC 3.29 (L) 3.87 - 5.11 MIL/uL   Hemoglobin 9.9 (L) 12.0 - 15.0 g/dL   HCT 30.2 (L) 36.0 - 46.0 %   MCV 91.8 78.0 - 100.0 fL   MCH 30.1 26.0 - 34.0 pg   MCHC 32.8 30.0 - 36.0 g/dL   RDW 12.9 11.5 - 15.5 %   Platelets  399 150 - 400 K/uL  Basic metabolic panel     Status: Abnormal   Collection Time: 11/05/14  2:58 PM  Result Value Ref Range   Sodium 134 (L) 135 - 145 mmol/L   Potassium 3.5 3.5 - 5.1 mmol/L   Chloride 98 (L) 101 - 111 mmol/L   CO2 28 22 - 32 mmol/L   Glucose, Bld 190 (H) 65 - 99 mg/dL   BUN 18 6 - 20 mg/dL   Creatinine, Ser 1.17 (H) 0.44 - 1.00 mg/dL   Calcium 9.2 8.9 - 10.3 mg/dL   GFR calc non Af Amer 50 (L) >60 mL/min   GFR calc Af Amer 57 (L) >60 mL/min    Comment: (NOTE) The eGFR has been calculated using the CKD EPI equation. This calculation has not been validated in all clinical situations. eGFR's persistently <60 mL/min signify possible Chronic Kidney Disease.    Anion gap 8 5 - 15  Glucose, capillary     Status: Abnormal   Collection Time: 11/05/14  3:04 PM  Result Value Ref Range   Glucose-Capillary 186 (H) 65 - 99 mg/dL    No results found.  Review of Systems  Constitutional: Positive for chills (chronic). Negative for diaphoresis.  HENT: Negative for congestion.   Respiratory: Negative for cough, shortness of breath and wheezing.   Cardiovascular: Positive for chest pain (last episode a week ago resolved spontaneously.). Negative for palpitations, orthopnea, claudication, leg swelling and PND.  Gastrointestinal: Negative for nausea, vomiting, abdominal pain, blood in stool and melena.  Genitourinary: Negative for hematuria.  Musculoskeletal: Positive for myalgias.  Neurological: Negative for dizziness.  All other systems reviewed and are negative.  Blood pressure 165/63, pulse 52, temperature 98.5 F (36.9 C), temperature source Oral, resp. rate 16, height  4' 11.75" (1.518 m), weight 105 lb (47.628 kg), SpO2 100 %. Physical Exam  Nursing note and vitals reviewed. Constitutional: She is oriented to person, place, and time. She appears well-developed and well-nourished. No distress.  HENT:  Head: Normocephalic and atraumatic.  Eyes: EOM are normal. Pupils  are equal, round, and reactive to light. No scleral icterus.  Neck: Normal range of motion. Neck supple. JVD present.  Cardiovascular: Normal rate, regular rhythm, S1 normal and S2 normal.   No murmur heard. Pulses:      Radial pulses are 2+ on the right side. Left radial pulse not accessible.       Dorsalis pedis pulses are 2+ on the right side, and 2+ on the left side.  Respiratory: Effort normal and breath sounds normal. No respiratory distress. She has no wheezes. She has no rales.  GI: Soft. Bowel sounds are normal. She exhibits no distension. There is no tenderness.  Musculoskeletal: She exhibits no edema (no lower extremity edema).  Lymphadenopathy:    She has no cervical adenopathy.  Neurological: She is alert and oriented to person, place, and time. She exhibits normal muscle tone.  Skin: Skin is warm and dry.  Psychiatric: She has a normal mood and affect.    Assessment/Plan:  PreOp Clearance  59 year old female with history of coronary artery disease and MI in 1999, mitral valve prolapse, coronary artery bypass grafting, DVT 1980, diabetes mellitus, chronic kidney disease stage III, Lyme disease, hypothyroidism, GERD, fibromyalgia, stroke.  She underwent coronary artery bypass surgery with 2 grafts at [facility] (LIMA graft to the LAD and SVG to the RCA). According to the patient, in 2000, she had two stents to the LIMA graft and the LAD. She's had EECP treatments in 2006 and 2007 in [place].   Her last heart catheterization was August 2015 at [facility] and it revealed two-vessel coronary disease with a patent LIMA to the LAD and SVG to the RCA. Her Imdur was decreased to 120 mg twice daily from twice a day and Coreg was increased to 3.125 twice a day from daily. Her last echocardiogram was August 2015 and she had normal ejection fraction normal LV size and wall thickness. There are no LV wall motion abnormalities, no AI or MR and trace TR.   Episodes of angina  were thought to be related to small vessel disease.    It appears her CAD has been stable over the last several years.  No progression on heart cath one year ago.   Cleared for surgery with a moderate risk.  MD opinion to follow.      Brittney Pace, [place] 11/05/2014, 4:24 PM

## 2014-11-05 NOTE — Progress Notes (Signed)
This nurse called Dr. Angus Palms office per Brittney Pace, [place] request, spoke with [facility] whom reported that she would let him know regarding consult to Cardiology. Cardiology MD in to see pt at 1625.

## 2014-11-05 NOTE — Progress Notes (Signed)
Orthopedic Tech Progress Note Patient Details:  Brittney Pace 1955-04-27 [id number]  Casting Type of Cast: Long arm cast Cast Location: LUE Cast Material: Fiberglass Cast Intervention: Removal     Braulio Bosch 11/05/2014, 4:56 PM

## 2014-11-05 NOTE — Progress Notes (Signed)
Notified diabetic coordinator Barnett Applebaum about frequency of blood glucose checks. Barnett Applebaum states that it is okay to check patient blood glucose per unit policy, [id number] while patient is awake.

## 2014-11-05 NOTE — Anesthesia Procedure Notes (Addendum)
Anesthesia Regional Block:  Supraclavicular block  Pre-Anesthetic Checklist: ,, timeout performed, Correct Patient, Correct Site, Correct Laterality, Correct Procedure, Correct Position, site marked, Risks and benefits discussed, Surgical consent,  Pre-op evaluation,  At surgeon's request  Laterality: Upper and Left  Prep: chloraprep       Needles:  Injection technique: Single-shot  Needle Type: Echogenic Needle          Additional Needles:  Procedures: ultrasound guided (picture in chart) Supraclavicular block Narrative:  Injection made incrementally with aspirations every 5 mL.  Performed by: Personally   Additional Notes: H+P and labs reviewed, risks and benefits discussed with patient, procedure tolerated well without complications   Procedure Name: MAC Date/Time: 11/05/2014 7:02 PM Performed by: Manuela Schwartz B Pre-anesthesia Checklist: Patient identified, Emergency Drugs available, Suction available, Patient being monitored and Timeout performed Patient Re-evaluated:Patient Re-evaluated prior to inductionOxygen Delivery Method: Simple face mask

## 2014-11-05 NOTE — Anesthesia Preprocedure Evaluation (Addendum)
Anesthesia Evaluation  Patient identified by MRN, date of birth, ID band Patient awake    Reviewed: Allergy & Precautions, H&P , NPO status , Patient's Chart, lab work & pertinent test results, reviewed documented beta blocker date and time   Airway Mallampati: II  TM Distance: >3 FB Neck ROM: Full    Dental no notable dental hx. (+) Teeth Intact, Dental Advisory Given   Pulmonary neg pulmonary ROS, former smoker,    Pulmonary exam normal breath sounds clear to auscultation       Cardiovascular hypertension, On Medications and On Home Beta Blockers + CAD, + Past MI, + Cardiac Stents, + CABG and + Peripheral Vascular Disease   Rhythm:Regular Rate:Normal     Neuro/Psych  Headaches, Depression CVA, No Residual Symptoms    GI/Hepatic Neg liver ROS, GERD  Medicated and Controlled,  Endo/Other  diabetes, Type 1, Insulin DependentHypothyroidism   Renal/GU Renal InsufficiencyRenal disease  negative genitourinary   Musculoskeletal  (+) Arthritis , Osteoarthritis,  Fibromyalgia -, narcotic dependent  Abdominal   Peds  Hematology negative hematology ROS (+) anemia ,   Anesthesia Other Findings   Reproductive/Obstetrics negative OB ROS                           Anesthesia Physical Anesthesia Plan  ASA: III  Anesthesia Plan: MAC and Regional   Post-op Pain Management:    Induction: Intravenous  Airway Management Planned: Simple Face Mask  Additional Equipment:   Intra-op Plan:   Post-operative Plan:   Informed Consent: I have reviewed the patients History and Physical, chart, labs and discussed the procedure including the risks, benefits and alternatives for the proposed anesthesia with the patient or authorized representative who has indicated his/her understanding and acceptance.   Dental advisory given  Plan Discussed with: CRNA  Anesthesia Plan Comments:         Anesthesia  Quick Evaluation

## 2014-11-05 NOTE — Transfer of Care (Signed)
Immediate Anesthesia Transfer of Care Note  Patient: Brittney Pace  Procedure(s) Performed: Procedure(s): OPEN REDUCTION INTERNAL FIXATION (ORIF) LEFT WRIST FRACTURE AND REPAIR AS INDICATED (Left)  Patient Location: PACU  Anesthesia Type:MAC and Regional  Level of Consciousness: awake, alert  and oriented  Airway & Oxygen Therapy: Patient Spontanous Breathing  Post-op Assessment: Report given to RN and Post -op Vital signs reviewed and stable  Post vital signs: Reviewed and stable  Last Vitals:  Filed Vitals:   11/05/14 2000  BP:   Pulse:   Temp: 36.6 C  Resp:     Complications: No apparent anesthesia complications

## 2014-11-05 NOTE — Progress Notes (Addendum)
Anesthesia Note: SAME DAY WORK-UP FOR TODAY.  Patient is a 59 year old female scheduled for ORIF left wrist fracture and repair as indicated today (late add-on). Anesthesia is posted as MAC.  History includes former smoker, MVP, MI/CAD s/p CABG X 2 (LIMA-LAD, SVG to distal RCA; in [place]), DM1 with retinopathy, peripheral neuropathy and gastroparesis, CKD stage 3, PVD, CVA X 2, IBS, anemia, Lyme disease, hypothyroidism, migraines, depression, hysterectomy, appendectomy. 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 She completed 26 treatments of Enhanced External Counterpulsation (EECP) for chronic chest secondary to small vessel disease on 11/19/13 [facility]). Still reports periodic chest pain, last one week ago.  PCP is Dr. Jennette Dubin with [facility], last visit 01/28/14. Cardiologist is Dr. Neva Seat with [facility], last visit 11/07/13. Endocrinologist is Dr. Smith Mince with [facility], last visit 06/27/14. Nephrologist is Dr. Rogers Blocker with [facility], last visit 08/27/14. Neurologist is Dr. Hollice Gong El Husseini with [facility], last visit 05/12/14. ** MANY RECORDS FROM [facility] AND [facility] CAN BE VIEWED IN CARE EVERYWHERE**  Current meds listed include acyclovir, ASA, Dilaudid, Imdur, levothyroxine, insulin pump, naltrexone, Nitro, KCL, Nexium, torsemide, Sporanox (hasn't started yet). She is on multiple vitamins and supplements which are marked as on hold. Based on morning medications that patient said she took, her phone PAT RN instructed to take acyclovir, Coreg, Synthroid, and Nexium.   09/18/13 Cardiac cath [facility]): LEFT VENTRICULOGRAPHY: LV ejection fraction = 65% CORONARY ANGIOGRAPHY: Proximal LAD Native 100% Ramus Native 50%  Proximal RCA Native 50%  Mid RCA Native 70%  Distal LAD Native 40% In-stent distal to anastomosis of LIMA.  Ramus Present: Yes Coronary Dominance: Right BYPASS GRAFT ANGIOGRAPHY: Left Internal Mammary 0 Proximal LAD Artery  Saphenous Vein 0 Distal RCA   CONCLUSIONS: CORONARY STATUS: Obstructive 2 vessel  LV FUNCTION: Ejection Fraction: 65% OTHER: LCGP for CP. Occluded ostial LAD. LIMA-LAD widely patent with stent in LAD. LCX/OM without obstructive disease. RCA with obstructive disease but patent, patent SVG -DRCA. LVEF normal. No gradient. EBL <10. No specimens. Nothing to warrant PCI.   05/01/13 Echo [facility]): SUMMARY The left ventricular size is normal. There is normal left ventricular wall thickness. Left ventricular systolic function is normal. LV ejection fraction = 60-65%. Left ventricular filling pattern is impaired. The left ventricular wall motion is normal. The right ventricle is normal in size and function. There is no pericardial effusion. There is no comparison study available.  11/04/13 EKG ([facility]; Care Everywhere): INTERPRETATION ONLY: SR, non-specific ST/T changes. She will need a new EKG on arrival.  11/20/13 Carotid U/S [facility]): This is an abnormal carotid ultrasound exam due to the disease described below (see full report in Care Everywhere), however there is no significant stenotic flow demonstrated bilaterally. No critical diameter stenosis or unusually complex plaque are seen on this exam. Insonation at multiple depths was attempted of the [facility]. No important focal intracranial hemodynamic abnormalities were seen. PI was at upper normal in many segments. There was no previous study available for direct comparison.  Labs on 10/27/14 (Care Everywhere) showed H/H 8.7/26.1. PLT 294. Chem profile on 11/04/13 showed a Cr 1.1. She will need new labs and would consider going a head and doing a T&S since she had a recent HGB in the 8 range.  PAT RN Sherlynn Stalls, reviewed patient's history with anesthesiologist Dr. Veatrice Kells and Dr. Linna Caprice last evening. Records were not available at that time. At that time there was consideration of need for preoperative  cardiology evaluation on arrival today, but  would determine after review of records and evaluation of patient. Since then I was able to request records thru [place], and records from [facility] and [facility] can be viewed in [place]. At this time, it is unknown which anesthesiologist will be assigned to the room.  I've asked nursing staff to let me know when patient arrives so I can make sure the assigned anesthesiologist is up to date and determine further recommendations.   George Hugh [facility] Short Stay Center/Anesthesiology Phone [phone] 11/05/2014 10:38 AM   Addendum: Patient now in [place], Room 31. She reports that in general she feels her intermittent chest pain symptoms are stable. She is more likely to get them when she is anemic. Chest pain is left sided, axillary region and into her back (which is how it's always been, even at the time of her MI). There is no associated diaphoresis, nausea, or SOB. She does not get exertional symptoms, but does not exert herself that much due to her anemia, fibromyalgia, etc. When her chest pain occurs it lasts a couple of minutes and is typically relieved with one Nitro spray.  The last time she had to take Nitro X 3 was when she was admitted for cardiac cath in 08/2013 and grafts were patent. She says she is overdue to see her cardiologist because she has been visiting her mother in [place] who is dying of cancer.   11/05/14 EKG: SR with artifact in limb leads. Possible LAE. Low QRS voltage. Non-specific ST/T wave abnormality.  Preoperative labs noted. H/H up to 10/02/28.2 (patient received Aranesp last week). Cr 1.17. Glucose 98. T&S available if needed.   Discussed with anesthesiologist Dr. Oletta Lamas who also spoke with patient. Recommend pre-operative cardiology evaluation. I notified [facility] card master Trish. RN Shelton Silvas in [place] will notify Dr. Caralyn Guile.  If patient is cleared, case will likely not be able to start until ~ 6 PM at earliest.  George Hugh [facility] Short  Stay Center/Anesthesiology Phone [phone] 11/05/2014 4:12 PM

## 2014-11-05 NOTE — Progress Notes (Signed)
Spoke with Barnett Applebaum, diabetic coordinator, regarding pt insulin pump and pt request to " control insulin pump during surgery if possible." Ebony Hail, [place], anesthesia made aware. Barnett Applebaum advised that pt blood sugar be checked every half hour; noted on pt chart.

## 2014-11-05 NOTE — Discharge Instructions (Signed)
KEEP BANDAGE CLEAN AND DRY °CALL OFFICE FOR F/U APPT [phone] in 14 days °DR Tiffinie Caillier CELL [phone] °KEEP HAND ELEVATED ABOVE HEART °OK TO APPLY ICE TO OPERATIVE AREA °CONTACT OFFICE IF ANY WORSENING PAIN OR CONCERNS. °

## 2014-11-05 NOTE — Progress Notes (Signed)
ANTIBIOTIC CONSULT NOTE - INITIAL  Pharmacy Consult for Vancomycin Indication: surgical prophylaxis  Allergies  Allergen Reactions  . Morphine And Related Shortness Of Breath  . Penicillins Hives    Has patient had a PCN reaction causing immediate rash, facial/tongue/throat swelling, SOB or lightheadedness with hypotension: Yes Has patient had a PCN reaction causing severe rash involving mucus membranes or skin necrosis: No Has patient had a PCN reaction that required hospitalization No Has patient had a PCN reaction occurring within the last 10 years: No If all of the above answers are "NO", then may proceed with Cephalosporin use.  . Amitriptyline Other (See Comments)    Severe headache/ out of body feeling  . Codeine Other (See Comments)    Severe headaches/ out of body feeling  . Darvon [Propoxyphene] Other (See Comments)    Severe headaches  . Mold Extract [Trichophyton] Other (See Comments)    sinusitis  . Other Swelling    Cat hair and scratches cause swelling  . Statins Other (See Comments)    Muscle pains  . Sulfa Antibiotics Other (See Comments)    Mouth ulcers  . Sulfites Other (See Comments)    Mouth ulcers  . Ultracet [Tramadol-Acetaminophen] Other (See Comments)    Small vessel heart attack    Patient Measurements: Height: 4' 11.75" (151.8 cm) Weight: 105 lb (47.628 kg) IBW/kg (Calculated) : 44.93 Adjusted Body Weight:   Vital Signs: Temp: 97.6 F (36.4 C) (10/12 2058) Temp Source: Oral (10/12 1502) BP: 174/65 mmHg (10/12 2058) Pulse Rate: 68 (10/12 2058) Intake/Output from previous day:   Intake/Output from this shift: Total I/O In: 700 [I.V.:700] Out: 2 [Blood:2]  Labs:  Recent Labs  11/05/14 1458  WBC 6.7  HGB 9.9*  PLT 399  CREATININE 1.17*   Estimated Creatinine Clearance: 36.2 mL/min (by C-G formula based on Cr of 1.17). No results for input(s): VANCOTROUGH, VANCOPEAK, VANCORANDOM, GENTTROUGH, GENTPEAK, GENTRANDOM, TOBRATROUGH,  TOBRAPEAK, TOBRARND, AMIKACINPEAK, AMIKACINTROU, AMIKACIN in the last 72 hours.   Microbiology: No results found for this or any previous visit (from the past 720 hour(s)).  Medical History: Past Medical History  Diagnosis Date  . Diabetes mellitus without complication (HCC)     Type 1  . Idiopathic edema   . IBS (irritable bowel syndrome)   . Diabetic retinopathy ([place])   . Anemia   . Chronic kidney disease     stage 3  . Lyme disease   . Babesiasis     secondary due to lyme disease  . Hypothyroidism   . Peripheral neuropathy ([place])   . Mitral valve prolapse   . Osteoporosis   . GERD (gastroesophageal reflux disease)   . Gastroparesis   . Fibromyalgia   . 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 Coronary artery disease   . Peripheral vascular disease ([place])   . Depression   . Headache     migraines  . Arthritis   . Myocardial infarction ([place])     1 major in 1999 and 2 minor " small vessel disease."  . 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 Family history of adverse reaction to anesthesia     mother: " while she was under she stopped breathing."    Medications:  Prescriptions prior to admission  Medication Sig Dispense Refill Last Dose  . acyclovir (ZOVIRAX) 400 MG tablet Take 400 mg by  mouth 2 (two) times daily.   11/04/2014 at Unknown time  . Alpha-Lipoic Acid 600 MG CAPS Take 600 mg by mouth daily.    Past Week at Unknown time  . arginine 500 MG tablet Take 500 mg by mouth daily.   Past Week at Unknown time  . aspirin EC 325 MG tablet Take 325 mg by mouth at bedtime.   Past Week at Unknown time  . carvedilol (COREG) 3.125 MG tablet Take 3.125 mg by mouth 2 (two) times daily with a meal.   11/05/2014 at 0900  . cetirizine (ZYRTEC) 10 MG tablet Take 10 mg by mouth at bedtime.   Past Week at Unknown time  . Cholecalciferol (VITAMIN D3) 5000 UNITS TABS Take 5,000 Units by mouth daily.    Past Week at  Unknown time  . Cyanocobalamin (VITAMIN B-12) 2500 MCG SUBL Place 2,500 mcg under the tongue daily.   Past Month at Unknown time  . Esomeprazole Magnesium (NEXIUM PO) Take 22.3 mg by mouth 2 (two) times daily.    11/05/2014 at 0900  . HYDROmorphone (DILAUDID) 2 MG tablet Take 2 mg by mouth every 6 (six) hours as needed (pain).   0 11/04/2014 at Unknown time  . Insulin Human (INSULIN PUMP) SOLN Inject into the skin continuous. Novolog insulin   11/05/2014 at 0900  . isosorbide mononitrate (IMDUR) 120 MG 24 hr tablet Take 120 mg by mouth at bedtime.   11/04/2014 at Unknown time  . levothyroxine (SYNTHROID, LEVOTHROID) 25 MCG tablet Take 25 mcg by mouth daily before breakfast.   11/05/2014 at 0900  . Linoleic Acid Conjugated 1000 MG CAPS Take 1,000 mg by mouth daily.    11/04/2014 at Unknown time  . MAGNESIUM CITRATE PO Take 150 mg by mouth at bedtime.   Past Week at Unknown time  . Melatonin 500 MCG TBDP Take 500 mcg by mouth at bedtime as needed (sleep).    Past Week at Unknown time  . Multiple Vitamins-Iron (CHLORELLA PO) Take 75 mg by mouth daily.   Past Week at Unknown time  . NALTREXONE HCL PO Take 3 mg by mouth at bedtime. Compounded at [employer], [place], [place]   Past Week at Unknown time  . nitroGLYCERIN (NITROLINGUAL) 0.4 MG/SPRAY spray Place 1 spray under the tongue every 5 (five) minutes x 3 doses as needed for chest pain.     . OIL OF OREGANO PO Take 1 capsule by mouth at bedtime.   Past Week at Unknown time  . OVER THE COUNTER MEDICATION Take 2.9 g by mouth at bedtime. SeroVital HGH   Past Week at Unknown time  . OVER THE COUNTER MEDICATION Take 2 g by mouth daily. L-glutamine powder   11/04/2014 at Unknown time  . potassium chloride (K-DUR,KLOR-CON) 10 MEQ tablet Take 10 mEq by mouth 2 (two) times daily.   11/04/2014 at Unknown time  . PRESCRIPTION MEDICATION Take 15 mcg by mouth daily. Liothyronine compounded at [employer]   11/05/2014 at 0900  .  Resveratrol 50 MG CAPS Take 50 mg by mouth daily.    Past Week at Unknown time  . Spirulina 500 MG TABS Take 500 mg by mouth daily.   Past Week at Unknown time  . torsemide ([name]) 20 MG tablet Take 20 mg by mouth See admin instructions. Take 1 tablet (20 mg) by mouth every morning, may take an extra 1/2 tablet (10 mg) as needed for swelling (usually in hot weather)   11/04/2014 at Unknown  time  . vitamin A 25000 UNIT capsule Take 50,000 Units by mouth daily.   11/04/2014 at Unknown time  . VITAMIN B1-B12 IM Inject 5 mg into the muscle every 3 (three) days. Last injection approximately 10/3 or 10/4 (25 mg/ml 0.2 ml syringe)   Past Week at Unknown time  . itraconazole (SPORANOX) 100 MG capsule Take 100 mg by mouth daily.      Scheduled:  . acyclovir  400 mg Oral BID  . carvedilol  3.125 mg Oral BID WC  . docusate sodium  100 mg Oral BID  . isosorbide mononitrate  120 mg Oral QHS  . itraconazole  100 mg Oral Q lunch  . [START ON 11/06/2014] levothyroxine  25 mcg Oral QAC breakfast  . loratadine  10 mg Oral Daily  . multivitamin with minerals  1 tablet Oral Daily  . [START ON 11/06/2014] pantoprazole  40 mg Oral Daily  . potassium chloride  10 mEq Oral BID  . torsemide  20 mg Oral Daily  . vitamin C  1,000 mg Oral Daily   Infusions:  . sodium chloride Stopped (11/05/14 1913)  . insulin pump    . lactated ringers     Assessment: 59yo female here with L distal radius fracture underwent surgery. Pharmacy is consulted to dose vancomycin for surgical prophylaxis following repair. Pt is afebrile, WBC wnl, sCr 1.2.  Goal of Therapy:  Vancomycin trough level 15-20 mcg/ml  Plan:  Vancomycin 750mg  IV q24h Measure antibiotic drug levels at steady state Follow up culture results, renal function, LOT and clinical course  Brittney Pace 11/05/2014,9:02 PM

## 2014-11-05 NOTE — Progress Notes (Signed)
Spoke with Dr. Jillyn Hidden, anesthesiologist, regarding pt NPO status,Type 1 diabetic and pt being scheduled for surgery around 4:30 P.M.-5:00 P.M. ( per OR); MD advised that pt should be NPO at least 8 hours prior to procedure.

## 2014-11-05 NOTE — Brief Op Note (Signed)
11/05/2014  7:28 AM  PATIENT:  Brittney Pace  59 y.o. female  PRE-OPERATIVE DIAGNOSIS:  left wrist intra articular distal radius fracture  POST-OPERATIVE DIAGNOSIS:  * No post-op diagnosis entered *  PROCEDURE:  Procedure(s): OPEN REDUCTION INTERNAL FIXATION (ORIF) LEFT WRIST FRACTURE AND REPAIR AS INDICATED (Left)  SURGEON:  Surgeon(s) and Role:    * Iran Planas, MD - Primary  PHYSICIAN ASSISTANT:   ASSISTANTS: none   ANESTHESIA:   regional  EBL:     BLOOD ADMINISTERED:none  DRAINS: none   LOCAL MEDICATIONS USED:  NONE  SPECIMEN:  No Specimen  DISPOSITION OF SPECIMEN:  N/A  COUNTS:  YES  TOURNIQUET:  * No tourniquets in log *  DICTATION: [id number]  PLAN OF CARE: Admit for overnight observation  PATIENT DISPOSITION:  PACU - hemodynamically stable.   Delay start of Pharmacological VTE agent (>24hrs) due to surgical blood loss or risk of bleeding: not applicable

## 2014-11-05 NOTE — H&P (Signed)
Brittney Pace is an 59 y.o. female.   Chief Complaint: LEFT DISTAL RADIUS FRACTURE HPI: PT FELL AND INJURED LEFT DISTAL RADIUS PT SEEN IN OFFICE PT HERE FOR SURGERY FOR DISPLACED LEFT DISTAL RADIUS FRACTURE NO PRIOR SURGERY TO LEFT WRIST  Past Medical History  Diagnosis Date  . Family history of adverse reaction to anesthesia     " while she was under she stopped breathing."  . Diabetes mellitus without complication (HCC)     Type 1  . Idiopathic edema   . IBS (irritable bowel syndrome)   . Diabetic retinopathy ([place])   . Anemia   . Chronic kidney disease     stage 3  . 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 Lyme disease   . Babesiasis     secondary due to lyme disease  . Hypothyroidism   . Peripheral neuropathy ([place])   . Mitral valve prolapse   . Osteoporosis   . GERD (gastroesophageal reflux disease)   . Gastroparesis   . Fibromyalgia   . 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 Coronary artery disease   . Peripheral vascular disease ([place])   . Depression   . Headache     migraines  . Arthritis   . Myocardial infarction ([place])     1 major in 1999 and 2 minor " small vessel disease."    Past Surgical History  Procedure Laterality Date  . Coronary artery bypass graft    . Cardiac catheterization    . Abdominal hysterectomy    . Coronary artery stents      at [facility] and [facility]  . Cataract extraction w/ intraocular lens implant      right eye  . Appendectomy    . Colonoscopy w/ biopsies and polypectomy    . Breast surgery      B/L biopsy and lumpectomy   . Ectopic pregnancy surgery      Family History  Problem Relation Age of Onset  . Breast cancer Mother   . Heart disease Father   . Hypertension Father   . Breast cancer Sister    Social History:  reports that she has quit smoking. Her smoking use included Cigarettes. She has never used smokeless tobacco. She reports that she drinks alcohol. She reports that she does  not use illicit drugs.  Allergies:  Allergies  Allergen Reactions  . Morphine And Related Shortness Of Breath  . Penicillins Hives    Has patient had a PCN reaction causing immediate rash, facial/tongue/throat swelling, SOB or lightheadedness with hypotension: Yes Has patient had a PCN reaction causing severe rash involving mucus membranes or skin necrosis: No Has patient had a PCN reaction that required hospitalization No Has patient had a PCN reaction occurring within the last 10 years: No If all of the above answers are "NO", then may proceed with Cephalosporin use.  . Amitriptyline Other (See Comments)    Severe headache/ out of body feeling  . Codeine Other (See Comments)    Severe headaches/ out of body feeling  . Darvon [Propoxyphene] Other (See Comments)    Severe headaches  . Mold Extract [Trichophyton] Other (See Comments)    sinusitis  . Other Swelling    Cat hair and scratches cause swelling  . Statins Other (See Comments)    Muscle pains  . Sulfa Antibiotics Other (See Comments)    Mouth ulcers  .  Sulfites Other (See Comments)    Mouth ulcers  . Ultracet [Tramadol-Acetaminophen] Other (See Comments)    Small vessel heart attack    No prescriptions prior to admission    No results found for this or any previous visit (from the past 48 hour(s)). No results found.  ROS NO RECENT ILLNESSES OR HOSPITALIZATIONS  There were no vitals taken for this visit. Physical Exam  General Appearance:  Alert, cooperative, no distress, appears stated age  Head:  Normocephalic, without obvious abnormality, atraumatic  Eyes:  Pupils equal, conjunctiva/corneas clear,         Throat: Lips, mucosa, and tongue normal; teeth and gums normal  Neck: No visible masses     Lungs:   respirations unlabored  Chest Wall:  No tenderness or deformity  Heart:  Regular rate and rhythm,  Abdomen:   Soft, non-tender,         Extremities: LONG ARM CAST IN PLACE FINGERS WARM WELL  PERFUSED ABLE TO EXTEND THUMB AND FINGERS GOOD DIGITAL MOTION  Pulses: 2+ and symmetric  Skin: Skin color, texture, turgor normal, no rashes or lesions     Neurologic: Normal    Assessment/Plan:  LEFT WRIST DISPLACED DISTAL RADIUS AND ULNA FRACTURE  LEFT WRIST OPEN REDUCTION AND INTERNAL FIXATION RADIUS AND ULNA, REPAIR AS INDICATED  R/B/A DISCUSSED WITH PT IN OFFICE.  PT VOICED UNDERSTANDING OF PLAN CONSENT SIGNED DAY OF SURGERY PT SEEN AND EXAMINED PRIOR TO OPERATIVE PROCEDURE/DAY OF SURGERY SITE MARKED. QUESTIONS ANSWERED WILL BE ADMITTED OBSERVATION FOLLOWING SURGERY  WE ARE PLANNING SURGERY FOR YOUR UPPER EXTREMITY. THE RISKS AND BENEFITS OF SURGERY INCLUDE BUT NOT LIMITED TO BLEEDING INFECTION, DAMAGE TO NEARBY NERVES ARTERIES TENDONS, FAILURE OF SURGERY TO ACCOMPLISH ITS INTENDED GOALS, PERSISTENT SYMPTOMS AND NEED FOR FURTHER SURGICAL INTERVENTION. WITH THIS IN MIND WE WILL PROCEED. I HAVE DISCUSSED WITH THE PATIENT THE PRE AND POSTOPERATIVE REGIMEN AND THE DOS AND DON'TS. PT VOICED UNDERSTANDING AND INFORMED CONSENT SIGNED.  Linna Hoff 11/05/2014, 1840

## 2014-11-05 NOTE — Progress Notes (Signed)
Dr. Caralyn Guile called regarding cardiology consult and ordered to have cast removed now. This nurse called Gayland Curry, at bedside at present to remove cast.

## 2014-11-05 NOTE — Progress Notes (Signed)
PHARMACIST - PHYSICIAN ORDER COMMUNICATION  CONCERNING: P&T Medication Policy on Herbal Medications  DESCRIPTION:  This patient's order for:  Spiruluna, vitamin A 50,000 units, chlorella, melatonin, conjugated linoleic acid, resveratrol  has been noted.  This product(s) is classified as an "herbal" or natural product. Due to a lack of definitive safety studies or FDA approval, nonstandard manufacturing practices, plus the potential risk of unknown drug-drug interactions while on inpatient medications, the Pharmacy and Therapeutics Committee does not permit the use of "herbal" or natural products of this type within [facility].   ACTION TAKEN: The pharmacy department is unable to verify this order at this time and your patient has been informed of this safety policy. Please reevaluate patient's clinical condition at discharge and address if the herbal or natural product(s) should be resumed at that time.  Eudelia Bunch, Pharm.D. [id number] 11/05/2014 8:57 PM

## 2014-11-05 NOTE — Progress Notes (Deleted)
Patient blood glucose pump monitor blood glucose continuously, will continue to monitor patient blood glucose Q30 minutes per diabetic coordinator, through patient insulin pump.

## 2014-11-06 ENCOUNTER — Encounter (HOSPITAL_COMMUNITY): Payer: Self-pay | Admitting: General Practice

## 2014-11-06 DIAGNOSIS — S52592A Other fractures of lower end of left radius, initial encounter for closed fracture: Secondary | ICD-10-CM | POA: Diagnosis not present

## 2014-11-06 LAB — GLUCOSE, CAPILLARY
Glucose-Capillary: 141 mg/dL — ABNORMAL HIGH (ref 65–99)
Glucose-Capillary: 185 mg/dL — ABNORMAL HIGH (ref 65–99)
Glucose-Capillary: 214 mg/dL — ABNORMAL HIGH (ref 65–99)

## 2014-11-06 MED ORDER — HYDROMORPHONE HCL 2 MG PO TABS
2.0000 mg | ORAL_TABLET | ORAL | Status: DC | PRN
  Administered 2014-11-06 (×2): 2 mg via ORAL
  Filled 2014-11-06 (×2): qty 1

## 2014-11-06 MED ORDER — INSULIN PUMP
Freq: Three times a day (TID) | SUBCUTANEOUS | Status: DC
  Administered 2014-11-06: 0.95 via SUBCUTANEOUS
  Administered 2014-11-06: 0.43 via SUBCUTANEOUS
  Filled 2014-11-06: qty 1

## 2014-11-06 MED ORDER — HYDROMORPHONE HCL 1 MG/ML IJ SOLN
1.0000 mg | INTRAMUSCULAR | Status: DC | PRN
  Administered 2014-11-06 (×3): 1 mg via INTRAVENOUS
  Filled 2014-11-06 (×3): qty 1

## 2014-11-06 MED ORDER — PROMETHAZINE HCL 25 MG/ML IJ SOLN
12.5000 mg | Freq: Four times a day (QID) | INTRAMUSCULAR | Status: DC | PRN
  Administered 2014-11-06: 12.5 mg via INTRAVENOUS
  Filled 2014-11-06: qty 1

## 2014-11-06 NOTE — Progress Notes (Signed)
Brittney Pace discharged home per MD order. Discharge instructions reviewed and discussed with patient. All questions and concerns answered. Copy of instructions and scripts given to patient. IV removed.  Patient escorted to car by staff in a wheelchair. No distress noted upon discharge.   [name] 11/06/2014 3:53 PM

## 2014-11-06 NOTE — Op Note (Signed)
[name], [name]            ACCOUNT NO.:  [id number]  MEDICAL RECORD NO.:  [id number]  LOCATION:  5N02C                        FACILITY:  [facility]  PHYSICIAN:  Linna Hoff IV, M.D.DATE OF BIRTH:  [birth year]  DATE OF PROCEDURE:  11/05/2014 DATE OF DISCHARGE:                              OPERATIVE REPORT   PREOPERATIVE DIAGNOSES: 1. Left wrist intra-articular distal radius fracture of 2 or more     fragments. 2. Left distal ulna fracture.  POSTOPERATIVE DIAGNOSES: 1. Left wrist intra-articular distal radius fracture of 2 or more     fragments. 2. Left distal ulna fracture.  ATTENDING PHYSICIAN:  Linna Hoff, M.D., who scrubbed and present for the entire procedure.  ASSISTANT SURGEON:  None.  ANESTHESIA:  Supraclavicular block with IV sedation.  SURGICAL PROCEDURE: 1. Open treatment of left wrist intra-articular distal radius     fracture, 2 or more fragments. 2. Left wrist closed treatment of distal ulna fracture without     internal fixation. 3. Radiographs 3 views, left wrist. 4. Left wrist brachioradialis tendon release and tendon tenotomy.  SURGICAL IMPLANTS:  DVR Crosslock and neural plate with 5 screws proximally and 6 distal locking pegs.  SURGICAL INDICATIONS:  Brittney Pace is a right-hand dominant female who sustained a closed injury to her left distal radius.  The patient with known osteoporosis.  It is recommended that she undergo the above procedure.  Risks, benefits, and alternatives were discussed in detail with the patient and signed informed consent was obtained.  Risks include, but not limited to bleeding, infection, damage to nearby nerves, arteries, tendons, loss of motion of wrist and digits, nonunion, malunion, hardware failure, need for further surgical intervention.  DESCRIPTION OF PROCEDURE:  The patient was properly identified in the preoperative holding area and mark with a permanent marker made on the left wrist to indicate the  correct operative site.  The patient was then brought back to the operating room, placed supine on the anesthesia room table.  General anesthesia was administered.  The patient tolerated this well.  A well-padded tourniquet was placed on the left brachium, sealed with 1000 drape.  The supraclavicular block had been performed. Sedation was administered.  Left upper extremity was then prepped and draped in normal sterile fashion.  Time-out was called, correct side was identified, and procedure then begun.  Attention then turned to the left wrist.  The limb was then elevated and tourniquet insufflated.  A longitudinal incision was made directly over the FCR sheath.  Dissection was carried down through skin and subcutaneous tissue.  The FCR sheath was then opened proximally and distally.  The FPL was then swept out of the way and an L-shaped pronator quadratus flap was then carried out and the fracture site was then exposed.  This had intra-articular fracture line extending into the joint 2 or more fragments fracture pattern.  The open reduction was then performed.  Following this, the volar plate was then applied.  It was held distally with a K-wire and its position confirmed using mini C-arm.  After plate position was then confirmed, oblong screw hole was used proximally.  This was then fixed proximally and then the distal fixation was then  carried out with distal locking pegs moving from an ulnar to radial direction.  Following this, proximal fixation was then carried out with a combination of locking and nonlocking screws in order to reduce the radial column and make sure the length of brachioradialis had been tenotomized and released off the radial styloid.  Final radiographs were then obtained.  Once the radius was fixed, stress examination did not show any instability of the distal radioulnar joint.  The distal nail bed was fractured, which appeared to align nicely in both planes, did  not need further internal fixation. The wound was thoroughly irrigated.  Pronator quadratus was then closed with 2-0 Vicryl suture.  The subcutaneous tissues were then closed with 4-0 Vicryl.  Skin was then closed with Vicryl Rapide.  Adaptic dressing and sterile compressive bandage was then applied.  The patient then placed in a well-padded sugar-tong splint, extubated, and taken to the recovery room in good condition.  INTRAOPERATIVE RADIOGRAPHS:  AP and lateral views of the wrist did show the volar plate fixation in place.  There was good alignment of both planes.  POSTPROCEDURAL PLAN:  The patient will be admitted overnight for IV antibiotics and pain control, discharged in the morning, seen back in the office in approximately 2 weeks for wound check, suture removal, application of short-arm cast until the 4-week mark, and then begin a therapy regimen at the 4-week mark.  X-rays at each visit.     Brittney Pace, M.D.     FWO/MEDQ  D:  11/05/2014  T:  11/06/2014  Job:  [id number]

## 2014-11-06 NOTE — Progress Notes (Signed)
PT Cancellation Note  Patient Details Name: Brittney Pace MRN: [id number] DOB: 1955/09/09   Cancelled Treatment:    Reason Eval/Treat Not Completed: Medical issues which prohibited therapy (pt reporting nausea and dizziness at rest in bed. Will attempt tomorrow. )   Philomena Doheny 11/06/2014, 12:42 PM [phone]

## 2014-11-06 NOTE — Progress Notes (Signed)
Inpatient Diabetes Program Recommendations  AACE/ADA: New Consensus Statement on Inpatient Glycemic Control (2015)  Target Ranges:  Prepandial:   less than 140 mg/dL      Peak postprandial:   less than 180 mg/dL (1-2 hours)      Critically ill patients:  140 - 180 mg/dL    Results for SELETHA, ZIMMERMANN (MRN [id number]) as of 11/06/2014 11:48  Ref. Range 11/06/2014 03:50 11/06/2014 06:52 11/06/2014 11:34  Glucose-Capillary Latest Ref Range: 65-99 mg/dL 141 (H) 185 (H) 214 (H)    Admit: Radial Fracture  History: DM Type 1 (for 50 years per patient)  Home DM Meds: Insulin Pump  Current Insulin Orders: Insulin Pump    -Met with patient this AM.  Patient A&O X4 and able to independently manage her insulin pump.  Patient stated she has enough insulin in her pump to last her until she is discharged.  -Reviewed insulin pump settings with patient on her pump.  They are as follows:  Basal Rates 12AM- 0.35 units/hr 3AM- 0.325 units/hr 9AM- 0.5 units/hr 4PM- 0.5 units/hr  Total Basal Insulin per 24 hour period= 10.5 units  Carbohydrate Coverage 1 unit for every 20 grams carbohydrates  Correction Factor 1 unit for every 100 mg/dl above Target CBG  Target CBG= 120 mg/dl    --Will follow patient during hospitalization--  Wyn Quaker RN, MSN, CDE Diabetes Coordinator Inpatient Glycemic Control Team Team Pager: [phone] (8a-5p)

## 2014-11-06 NOTE — Progress Notes (Signed)
Called Dr Lequita Asal office pt wanted increase in pain med and something else for nausea

## 2014-11-06 NOTE — Progress Notes (Signed)
Orthopedic Tech Progress Note Patient Details:  Brittney Pace 12-04-1955 [id number] [place] sling applied to LUE Ortho Devices Type of Ortho Device: Arm sling Ortho Device/Splint Location: LUE Ortho Device/Splint Interventions: Application   Brittney Pace 11/06/2014, 7:21 AM

## 2014-11-06 NOTE — Evaluation (Signed)
Occupational Therapy Evaluation Patient Details Name: Brittney Pace MRN: [id number] DOB: 1955-12-31 Today's Date: 11/06/2014    History of Present Illness OPEN REDUCTION INTERNAL FIXATION (ORIF) LEFT WRIST FRACTURE AND REPAIR AS INDICATED   Clinical Impression   Pt reports that she was independent with ADLs prior to accident ~1 week ago. Since her accident pt reports that she required assist from her husband with getting in and out of the shower and bathing, otherwise she reports she was independent with ADLs. Pt with significant nausea with emesis and pain this morning (RN notified). Pt plan to d/c home with intermittent supervision from her husband. Pt would benefit from skilled OT in order to maximize independence with ADLs and functional mobility prior to d/c home.     Follow Up Recommendations  Other (comment) (TBD )    Equipment Recommendations  Other (comment) (TBD )    Recommendations for Other Services PT consult     Precautions / Restrictions Precautions Precautions: Fall Required Braces or Orthoses: Sling Restrictions Weight Bearing Restrictions: Yes LUE Weight Bearing: Non weight bearing      Mobility Bed Mobility Overal bed mobility: Needs Assistance Bed Mobility: Supine to Sit;Sit to Supine     Supine to sit: Supervision;HOB elevated Sit to supine: Supervision;HOB elevated      Transfers                 General transfer comment: Transfer level not determined at this time due to nausea and emesis     Balance                                            ADL Overall ADL's : Needs assistance/impaired                                       General ADL Comments: Due to nausea with emesis pt unable to participate in ADLs at this time. Pt reports that she has been dealing with this injury for a week now and has needed help getting in/out of the tub and bathing. Pt reports she was able to dress herself wearing  loose fitting pants and slide on shoes. Pt reports her husband has been assisting as needed. Educated pt on compensatory strategies for UB ADLs, edema managemet techniques, finger and shoulder ROM exercises; pt verbalized understanding.      Vision     Perception     Praxis      Pertinent Vitals/Pain Pain Assessment: 0-10 Pain Score: 10-Worst pain ever Pain Location: L UE  Pain Descriptors / Indicators: Aching;Grimacing;Sharp Pain Intervention(s): Limited activity within patient's tolerance;Monitored during session;Repositioned;Patient requesting pain meds-RN notified;Ice applied     Hand Dominance Right   Extremity/Trunk Assessment Upper Extremity Assessment Upper Extremity Assessment: LUE deficits/detail LUE Deficits / Details: shoulder ROM/MMT WFL LUE: Unable to fully assess due to pain;Unable to fully assess due to immobilization   Lower Extremity Assessment Lower Extremity Assessment: Defer to PT evaluation       Communication Communication Communication: No difficulties   Cognition Arousal/Alertness: Awake/alert Behavior During Therapy: WFL for tasks assessed/performed Overall Cognitive Status: Within Functional Limits for tasks assessed                     General Comments  Exercises       Shoulder Instructions      Home Living Family/patient expects to be discharged to:: Private residence Living Arrangements: Spouse/significant other Available Help at Discharge: Family;Available PRN/intermittently Type of Home: House       Home Layout: Multi-level;Bed/bath upstairs (bedroom/bathroom on 3rd level)     Bathroom Shower/Tub: Teacher, early years/pre: Standard     Home Equipment: None          Prior Functioning/Environment Level of Independence: Independent             OT Diagnosis: Acute pain   OT Problem List: Decreased activity tolerance;Decreased knowledge of use of DME or AE;Decreased knowledge of  precautions;Pain   OT Treatment/Interventions: Self-care/ADL training;DME and/or AE instruction;Patient/family education    OT Goals(Current goals can be found in the care plan section) Acute Rehab OT Goals Patient Stated Goal: decrease pain and nausea OT Goal Formulation: With patient Time For Goal Achievement: 11/20/14 Potential to Achieve Goals: Good ADL Goals Pt Will Transfer to Toilet: with modified independence;ambulating Pt Will Perform Toileting - Clothing Manipulation and hygiene: with modified independence;sit to/from stand  OT Frequency: Min 2X/week   Barriers to D/C:            Co-evaluation              End of Session Nurse Communication: Other (comment) (Pt with significant nausea and emesis)  Activity Tolerance: Patient limited by pain;Other (comment) (Limited by nausea and emesis ) Patient left: in bed;with call bell/phone within reach   Time: 0836-0859 OT Time Calculation (min): 23 min Charges:  OT General Charges $OT Visit: 1 Procedure OT Evaluation $Initial OT Evaluation Tier I: 1 Procedure OT Treatments $Self Care/Home Management : 8-22 mins G-Codes: OT G-codes **NOT FOR INPATIENT CLASS** Functional Assessment Tool Used: Clinical judgement Functional Limitation: Self care Self Care Current Status [id number]): At least 20 percent but less than 40 percent impaired, limited or restricted Self Care Goal Status [id number]): At least 1 percent but less than 20 percent impaired, limited or restricted   Binnie Kand M.S., OTR/L Pager: [phone]  11/06/2014, 9:17 AM

## 2014-11-06 NOTE — Discharge Summary (Signed)
Physician Discharge Summary  Patient ID: Brittney Pace MRN: [id number] DOB/AGE: 60-Aug-1957 60 y.o.  Admit date: 11/05/2014 Discharge date: 11/06/2014  Admission Diagnoses: left wrist intra articular distal radius fracture Past Medical History  Diagnosis Date  . Diabetes mellitus without complication (HCC)     Type 1  . Idiopathic edema   . IBS (irritable bowel syndrome)   . Diabetic retinopathy ([place])   . Anemia   . Chronic kidney disease     stage 3  . Lyme disease   . Babesiasis     secondary due to lyme disease  . Hypothyroidism   . Peripheral neuropathy ([place])   . Mitral valve prolapse   . Osteoporosis   . GERD (gastroesophageal reflux disease)   . Gastroparesis   . Fibromyalgia   . 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 Coronary artery disease   . Peripheral vascular disease ([place])   . Depression   . Headache     migraines  . Arthritis   . Myocardial infarction ([place])     1 major in 1999 and 2 minor " small vessel disease."  . 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 Family history of adverse reaction to anesthesia     mother: " while she was under she stopped breathing."    Discharge Diagnoses:  Active Problems:   Distal radius fracture, left   Surgeries: Procedure(s): OPEN REDUCTION INTERNAL FIXATION (ORIF) LEFT WRIST FRACTURE AND REPAIR AS INDICATED on 11/05/2014    Consultants: Treatment Team:  Rounding [facility], MD  Discharged Condition: Improved  Hospital Course: Aravis Sergeant is an 59 y.o. female who was admitted 11/05/2014 with a chief complaint of No chief complaint on file. , and found to have a diagnosis of left wrist intra articular distal radius fracture.  They were brought to the operating room on 11/05/2014 and underwent Procedure(s): OPEN REDUCTION INTERNAL FIXATION (ORIF) LEFT WRIST FRACTURE AND REPAIR AS INDICATED.    They were given perioperative  antibiotics: Anti-infectives    Start     Dose/Rate Route Frequency Ordered Stop   11/06/14 1000  vancomycin (VANCOCIN) IVPB 750 mg/150 ml premix     750 mg 150 mL/hr over 60 Minutes Intravenous Every 24 hours 11/05/14 2107     11/06/14 0600  vancomycin (VANCOCIN) IVPB 1000 mg/200 mL premix  Status:  Discontinued     1,000 mg 200 mL/hr over 60 Minutes Intravenous On call to O.R. 11/05/14 1431 11/05/14 2042   11/05/14 2030  itraconazole (SPORANOX) capsule 100 mg     100 mg Oral Daily with lunch 11/05/14 1846     11/05/14 2000  acyclovir (ZOVIRAX) tablet 400 mg     400 mg Oral 2 times daily 11/05/14 1846     11/05/14 1441  vancomycin (VANCOCIN) 1 GM/200ML IVPB    Comments:  Ronnald Ramp, [place]   : cabinet override      11/05/14 1441 11/05/14 1945    .  They were given sequential compression devices, early ambulation, and Other (comment) for DVT prophylaxis.  Recent vital signs: Patient Vitals for the past 24 hrs:  BP Temp Temp src Pulse Resp SpO2 Height Weight  11/06/14 0822 (!) 108/45 mmHg - - - - - - -  11/06/14 0526 (!) 108/45 mmHg 98.2 F (36.8 C) - (!) 123 16 100 % - -  11/06/14 0320 (!) 133/53 mmHg 97.5  F (36.4 C) - 67 16 100 % - -  11/05/14 2121 (!) 143/83 mmHg 98.7 F (37.1 C) - (!) 122 16 100 % - -  11/05/14 2058 (!) 174/65 mmHg 97.6 F (36.4 C) - 68 16 100 % - -  11/05/14 2030 (!) 166/65 mmHg 97.2 F (36.2 C) - 63 12 98 % - -  11/05/14 2015 (!) 165/66 mmHg - - 65 11 99 % - -  11/05/14 2010 - - - 66 11 97 % - -  11/05/14 2000 (!) 151/64 mmHg 97.9 F (36.6 C) - 67 13 100 % - -  11/05/14 1845 (!) 139/57 mmHg - - (!) 56 14 100 % - -  11/05/14 1840 (!) 138/54 mmHg - - (!) 57 (!) 24 98 % - -  11/05/14 1835 (!) 130/53 mmHg - - (!) 58 13 98 % - -  11/05/14 1830 (!) 144/54 mmHg - - 60 15 100 % - -  11/05/14 1502 (!) 165/63 mmHg 98.5 F (36.9 C) Oral (!) 52 16 100 % 4' 11.75" (1.518 m) 47.628 kg (105 lb)  .  Recent laboratory studies: No results found.  Discharge Medications:      Medication List    TAKE these medications        acyclovir 400 MG tablet  Commonly known as:  ZOVIRAX  Take 400 mg by mouth 2 (two) times daily.     Alpha-Lipoic Acid 600 MG Caps  Take 600 mg by mouth daily.     arginine 500 MG tablet  Take 500 mg by mouth daily.     aspirin EC 325 MG tablet  Take 325 mg by mouth at bedtime.     carvedilol 3.125 MG tablet  Commonly known as:  COREG  Take 3.125 mg by mouth 2 (two) times daily with a meal.     cetirizine 10 MG tablet  Commonly known as:  ZYRTEC  Take 10 mg by mouth at bedtime.     CHLORELLA PO  Take 75 mg by mouth daily.     docusate sodium 100 MG capsule  Commonly known as:  COLACE  Take 1 capsule (100 mg total) by mouth 2 (two) times daily.     HYDROmorphone 2 MG tablet  Commonly known as:  DILAUDID  Take 2 mg by mouth every 6 (six) hours as needed (pain).     insulin pump Soln  Inject into the skin continuous. Novolog insulin     isosorbide mononitrate 120 MG 24 hr tablet  Commonly known as:  IMDUR  Take 120 mg by mouth at bedtime.     itraconazole 100 MG capsule  Commonly known as:  SPORANOX  Take 100 mg by mouth daily.     levothyroxine 25 MCG tablet  Commonly known as:  SYNTHROID, LEVOTHROID  Take 25 mcg by mouth daily before breakfast.     Linoleic Acid Conjugated 1000 MG Caps  Take 1,000 mg by mouth daily.     MAGNESIUM CITRATE PO  Take 150 mg by mouth at bedtime.     Melatonin 500 MCG Tbdp  Take 500 mcg by mouth at bedtime as needed (sleep).     methocarbamol 500 MG tablet  Commonly known as:  ROBAXIN  Take 1 tablet (500 mg total) by mouth 4 (four) times daily.     NALTREXONE HCL PO  Take 3 mg by mouth at bedtime. Compounded at [employer], [place], [place]     NEXIUM PO  Take 22.3  mg by mouth 2 (two) times daily.     nitroGLYCERIN 0.4 MG/SPRAY spray  Commonly known as:  NITROLINGUAL  Place 1 spray under the tongue every 5 (five) minutes x 3 doses as needed for chest  pain.     OIL OF OREGANO PO  Take 1 capsule by mouth at bedtime.     ondansetron 4 MG tablet  Commonly known as:  ZOFRAN  Take 1 tablet (4 mg total) by mouth every 8 (eight) hours as needed for nausea or vomiting.     OVER THE COUNTER MEDICATION  Take 2.9 g by mouth at bedtime. SeroVital HGH     OVER THE COUNTER MEDICATION  Take 2 g by mouth daily. L-glutamine powder     oxyCODONE-acetaminophen 5-325 MG tablet  Commonly known as:  ROXICET  Take 1 tablet by mouth every 4 (four) hours as needed for severe pain.     potassium chloride 10 MEQ tablet  Commonly known as:  K-DUR,KLOR-CON  Take 10 mEq by mouth 2 (two) times daily.     PRESCRIPTION MEDICATION  Take 15 mcg by mouth daily. Liothyronine compounded at [facility]     Resveratrol 50 MG Caps  Take 50 mg by mouth daily.     Spirulina 500 MG Tabs  Take 500 mg by mouth daily.     torsemide 20 MG tablet  Commonly known as:  [name]  Take 20 mg by mouth See admin instructions. Take 1 tablet (20 mg) by mouth every morning, may take an extra 1/2 tablet (10 mg) as needed for swelling (usually in hot weather)     vitamin A 25000 UNIT capsule  Take 50,000 Units by mouth daily.     Vitamin B-12 2500 MCG Subl  Place 2,500 mcg under the tongue daily.     VITAMIN B1-B12 IM  Inject 5 mg into the muscle every 3 (three) days. Last injection approximately 10/3 or 10/4 (25 mg/ml 0.2 ml syringe)     vitamin C 500 MG tablet  Commonly known as:  ASCORBIC ACID  Take 1 tablet (500 mg total) by mouth daily.     Vitamin D3 5000 UNITS Tabs  Take 5,000 Units by mouth daily.        Diagnostic Studies: No results found.  They benefited maximally from their hospital stay and there were no complications.     Disposition: Final discharge disposition not confirmed      Follow-up Information    Follow up with Linna Hoff, MD. Schedule an appointment as soon as possible for a visit in 2 weeks.   Specialty:   Orthopedic Surgery   Contact information:   [address] [place] [address] [phone]        Signed: Linna Hoff 11/06/2014, 2:24 PM

## 2014-11-06 NOTE — Anesthesia Postprocedure Evaluation (Signed)
  Anesthesia Post-op Note  Patient: Brittney Pace  Procedure(s) Performed: Procedure(s): OPEN REDUCTION INTERNAL FIXATION (ORIF) LEFT WRIST FRACTURE AND REPAIR AS INDICATED (Left)  Patient Location: PACU  Anesthesia Type:MAC and Regional  Level of Consciousness: awake  Airway and Oxygen Therapy: Patient Spontanous Breathing  Post-op Pain: none  Post-op Assessment: Post-op Vital signs reviewed, Patient's Cardiovascular Status Stable, Respiratory Function Stable, Patent Airway, No signs of Nausea or vomiting and Pain level controlled              Post-op Vital Signs: Reviewed and stable  Last Vitals:  Filed Vitals:   11/06/14 1456  BP: 126/68  Pulse: 64  Temp: 36.7 C  Resp: 16    Complications: No apparent anesthesia complications

## 2014-11-12 ENCOUNTER — Encounter (HOSPITAL_COMMUNITY): Payer: Self-pay | Admitting: Orthopedic Surgery

## 2014-11-18 ENCOUNTER — Telehealth: Payer: Self-pay | Admitting: Cardiology

## 2014-11-18 NOTE — Telephone Encounter (Signed)
Received records from [facility] - Dr Marzetta Board- for appointment on 11/19/14 with Dr Stanford Breed. Jule Ser).  Records given to Evette Cristal RN for Dr Jacalyn Lefevre schedule on 11/19/14. lp

## 2014-11-19 ENCOUNTER — Encounter: Payer: Self-pay | Admitting: Cardiology

## 2014-11-19 ENCOUNTER — Ambulatory Visit (INDEPENDENT_AMBULATORY_CARE_PROVIDER_SITE_OTHER): Payer: Medicare Other | Admitting: Cardiology

## 2014-11-19 VITALS — BP 122/78 | HR 62 | Ht 59.5 in | Wt 104.0 lb

## 2014-11-19 DIAGNOSIS — E109 Type 1 diabetes mellitus without complications: Secondary | ICD-10-CM | POA: Insufficient documentation

## 2014-11-19 DIAGNOSIS — I2583 Coronary atherosclerosis due to lipid rich plaque: Secondary | ICD-10-CM

## 2014-11-19 DIAGNOSIS — I2510 Atherosclerotic heart disease of native coronary artery without angina pectoris: Secondary | ICD-10-CM

## 2014-11-19 NOTE — Assessment & Plan Note (Signed)
Management per orthopedics.

## 2014-11-19 NOTE — Assessment & Plan Note (Signed)
Management per primary care. 

## 2014-11-19 NOTE — Patient Instructions (Signed)
Medication Instructions:   NO CHANGE  Labwork:  Your physician recommends that you return for lab work in: PRIOR TO EATING  Follow-Up:  Your physician wants you to follow-up in: [place] will receive a reminder letter in the mail two months in advance. If you don't receive a letter, please call our office to schedule the follow-up appointment.   If you need a refill on your cardiac medications before your next appointment, please call your pharmacy.

## 2014-11-19 NOTE — Progress Notes (Signed)
HPI: 59 yo female for fu of CAD. 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 She's had EECP treatments in [place]. Her last heart catheterization was August 2015 at [facility] which revealed two-vessel coronary disease with a patent LIMA to the LAD and SVG to the RCA. No obstructive disease in left circumflex; EF 65. Last echocardiogram was August 2015 and she had normal LV function. Recently had repair of distal radius fracture and seen preop by Dr Ellyn Hack. Since last seen, Patient denies dyspnea, chest pain, palpitations or syncope.  Current Outpatient Prescriptions  Medication Sig Dispense Refill  . acyclovir (ZOVIRAX) 400 MG tablet Take 400 mg by mouth 2 (two) times daily.    . Alpha-Lipoic Acid 600 MG CAPS Take 600 mg by mouth daily.     Marland Kitchen arginine 500 MG tablet Take 500 mg by mouth daily.    Marland Kitchen aspirin EC 325 MG tablet Take 325 mg by mouth at bedtime.    . carvedilol (COREG) 3.125 MG tablet Take 3.125 mg by mouth 2 (two) times daily with a meal.    . cetirizine (ZYRTEC) 10 MG tablet Take 10 mg by mouth at bedtime.    . Cholecalciferol (VITAMIN D3) 5000 UNITS TABS Take 5,000 Units by mouth daily.     . Cyanocobalamin (VITAMIN B-12) 2500 MCG SUBL Place 2,500 mcg under the tongue daily.    Marland Kitchen docusate sodium (COLACE) 100 MG capsule Take 1 capsule (100 mg total) by mouth 2 (two) times daily. 30 capsule 0  . Esomeprazole Magnesium (NEXIUM PO) Take 22.3 mg by mouth 2 (two) times daily.     Marland Kitchen HYDROmorphone (DILAUDID) 2 MG tablet Take 2 mg by mouth every 6 (six) hours as needed (pain).   0  . Insulin Human (INSULIN PUMP) SOLN Inject into the skin continuous. Novolog insulin    . isosorbide mononitrate (IMDUR) 120 MG 24 hr tablet Take 120 mg by mouth at bedtime.    Marland Kitchen levothyroxine (SYNTHROID, LEVOTHROID) 25 MCG tablet Take 25 mcg by mouth daily before  breakfast.    . Linoleic Acid Conjugated 1000 MG CAPS Take 1,000 mg by mouth daily.     . Melatonin 500 MCG TBDP Take 500 mcg by mouth at bedtime as needed (sleep).     . methocarbamol (ROBAXIN) 500 MG tablet Take 1 tablet (500 mg total) by mouth 4 (four) times daily. 30 tablet 0  . NALTREXONE HCL PO Take 3 mg by mouth at bedtime. Compounded at [facility], [place], [place]    . nitroGLYCERIN (NITROLINGUAL) 0.4 MG/SPRAY spray Place 1 spray under the tongue every 5 (five) minutes x 3 doses as needed for chest pain.    . OIL OF OREGANO PO Take 1 capsule by mouth at bedtime.    . ondansetron (ZOFRAN) 4 MG tablet Take 1 tablet (4 mg total) by mouth every 8 (eight) hours as needed for nausea or vomiting. 20 tablet 0  . OVER THE COUNTER MEDICATION Take 2.9 g by mouth at bedtime. SeroVital HGH    . OVER THE COUNTER MEDICATION Take 2 g by mouth daily. L-glutamine powder    . potassium chloride (K-DUR,KLOR-CON) 10 MEQ tablet Take 10 mEq by mouth 2 (two) times daily.    Marland Kitchen PRESCRIPTION MEDICATION Take 15 mcg by mouth daily. Liothyronine compounded at [facility]    .  promethazine (PHENERGAN) 25 MG tablet Take 25 mg by mouth every 6 (six) hours as needed for nausea or vomiting.    . torsemide ([name]) 20 MG tablet Take 20 mg by mouth See admin instructions. Take 1 tablet (20 mg) by mouth every morning, may take an extra 1/2 tablet (10 mg) as needed for swelling (usually in hot weather)    . itraconazole (SPORANOX) 100 MG capsule Take 100 mg by mouth daily.     No current facility-administered medications for this visit.     Past Medical History  Diagnosis Date  . Diabetes mellitus without complication (HCC)     Type 1  . Idiopathic edema   . IBS (irritable bowel syndrome)   . Diabetic retinopathy ([place])   . Anemia   . Chronic kidney disease     stage 3  . Lyme disease   . Babesiasis     secondary due to lyme disease  . Hypothyroidism   . Peripheral neuropathy  ([place])   . Mitral valve prolapse   . Osteoporosis   . GERD (gastroesophageal reflux disease)   . Gastroparesis   . Fibromyalgia   . 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 Coronary artery disease   . Peripheral vascular disease ([place])   . Depression   . Headache     migraines  . Arthritis   . Myocardial infarction ([place])     1 major in 1999 and 2 minor " small vessel disease."  . 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 Family history of adverse reaction to anesthesia     mother: " while she was under she stopped breathing."    Past Surgical History  Procedure Laterality Date  . Coronary artery bypass graft    . Cardiac catheterization    . Abdominal hysterectomy    . Coronary artery stents      at LAD and LIMA  . Cataract extraction w/ intraocular lens implant      right eye  . Appendectomy    . Colonoscopy w/ biopsies and polypectomy    . Breast surgery      B/L biopsy and lumpectomy   . Ectopic pregnancy surgery    . Orif wrist fracture Left 11/05/2014  . Orif wrist fracture Left 11/05/2014    Procedure: OPEN REDUCTION INTERNAL FIXATION (ORIF) LEFT WRIST FRACTURE AND REPAIR AS INDICATED;  Surgeon: Iran Planas, MD;  Location: [facility];  Service: Orthopedics;  Laterality: Left;    Social History   Social History  . Marital Status: Married    Spouse Name: N/A  . Number of Children: N/A  . Years of Education: N/A   Occupational History  . Not on file.   Social History Main Topics  . Smoking status: Former Smoker    Types: Cigarettes  . Smokeless tobacco: Never Used     Comment:  " Quit smoking cigarettes in 20's "  . Alcohol Use: Yes     Comment: occasional beer or wine  . Drug Use: No  . Sexual Activity: Not on file   Other Topics Concern  . Not on file   Social History Narrative    ROS: Some residual pain in wrist but no fevers or chills, productive cough, hemoptysis, dysphasia,  odynophagia, melena, hematochezia, dysuria, hematuria, rash, seizure activity, orthopnea, PND, pedal edema, claudication. Remaining systems are negative.  Physical  Exam: Well-developed well-nourished in no acute distress.  Skin is warm and dry.  HEENT is normal.  Neck is supple.  Chest is clear to auscultation with normal expansion.  Cardiovascular exam is regular rate and rhythm.  Abdominal exam nontender or distended. No masses palpated. Extremities show no edema. Upper extremity in cast neuro grossly intact  ECG 11/05/14-Sinus rhythm, nonspecific ST changes.    ECG today shows sinus with nonspecific ST changes

## 2014-11-19 NOTE — Assessment & Plan Note (Addendum)
Patient is doing well with no chest pain. Last catheterization showed patent grafts. She apparently has had problems with recurrent chest pain since her bypass surgery. She was treated with EECP previously. Plan to continue aspirin. She apparently has been intolerant to all statins and Zetia. I will check lipids. If LDL significantly elevated will consider repatha. Continue low-dose beta blocker and nitrates. Note approximately 25 minutes spent reviewing outside records prior to patient's visit.

## 2015-01-02 ENCOUNTER — Other Ambulatory Visit: Payer: Self-pay | Admitting: Cardiology

## 2015-01-02 MED ORDER — NITROGLYCERIN 0.4 MG/SPRAY TL SOLN: 1.0000 | Status: DC | PRN

## 2015-01-02 NOTE — Telephone Encounter (Signed)
Rx send into pt pharmacy

## 2015-01-02 NOTE — Telephone Encounter (Signed)
°*  STAT* If patient is at the pharmacy, call can be transferred to refill team.   1. Which medications need to be refilled? (please list name of each medication and dose if known) Nitro Spray   2. Which pharmacy/location (including street and city if local pharmacy) is medication to be sent to?[facility] on [address]  [phone]  3. Do they need a 30 day or 90 day supply? PRN

## 2015-02-05 ENCOUNTER — Other Ambulatory Visit: Payer: Self-pay | Admitting: Cardiology

## 2015-02-05 MED ORDER — CARVEDILOL 3.125 MG PO TABS: 3.1250 mg | ORAL_TABLET | Freq: Two times a day (BID) | ORAL | Status: DC

## 2015-03-25 ENCOUNTER — Other Ambulatory Visit: Payer: Self-pay | Admitting: *Deleted

## 2015-03-25 MED ORDER — TORSEMIDE 20 MG PO TABS: 20.0000 mg | ORAL_TABLET | ORAL | Status: DC

## 2015-03-25 MED ORDER — POTASSIUM CHLORIDE CRYS ER 10 MEQ PO TBCR: 10.0000 meq | EXTENDED_RELEASE_TABLET | Freq: Two times a day (BID) | ORAL | Status: DC

## 2015-03-26 ENCOUNTER — Other Ambulatory Visit: Payer: Self-pay | Admitting: *Deleted

## 2015-03-26 MED ORDER — TORSEMIDE 20 MG PO TABS: 20.0000 mg | ORAL_TABLET | ORAL | Status: DC

## 2015-03-27 ENCOUNTER — Other Ambulatory Visit: Payer: Self-pay | Admitting: *Deleted

## 2015-03-27 MED ORDER — TORSEMIDE 20 MG PO TABS: 20.0000 mg | ORAL_TABLET | ORAL | Status: DC

## 2015-03-27 MED ORDER — TORSEMIDE 20 MG PO TABS: ORAL_TABLET | ORAL | Status: DC

## 2015-05-05 ENCOUNTER — Telehealth: Payer: Self-pay | Admitting: Cardiology

## 2015-05-05 NOTE — Telephone Encounter (Signed)
1. Type of surgery: right radius: right distal radius ORIF/repair 2. Date of surgery: 05/07/2015 3. Surgeon: Dr. Iran Planas 4. Medications that need to be held & how long: none specified - patient takes aspirin 5. Fax and/or Phone: (p) [phone]  (f) [phone]  Attn: Santiago Bur (surgical scheduling)  Message routed to Dr. Stanford Breed

## 2015-05-05 NOTE — Telephone Encounter (Signed)
Ok for surgery Brittney Pace  

## 2015-05-06 ENCOUNTER — Ambulatory Visit (HOSPITAL_COMMUNITY): Payer: Medicare Other | Admitting: Anesthesiology

## 2015-05-06 ENCOUNTER — Observation Stay (HOSPITAL_COMMUNITY)
Admission: RE | Admit: 2015-05-06 | Discharge: 2015-05-07 | Disposition: A | Discharge status: Home / Self Care | Payer: Medicare Other | Source: Ambulatory Visit | Attending: Orthopedic Surgery | Admitting: Orthopedic Surgery

## 2015-05-06 ENCOUNTER — Encounter (HOSPITAL_COMMUNITY)
Admission: RE | Disposition: A | Discharge status: Home / Self Care | Payer: Self-pay | Source: Ambulatory Visit | Attending: Orthopedic Surgery

## 2015-05-06 ENCOUNTER — Encounter (HOSPITAL_COMMUNITY): Payer: Self-pay | Admitting: *Deleted

## 2015-05-06 DIAGNOSIS — Z8673 Personal history of transient ischemic attack (TIA), and cerebral infarction without residual deficits: Secondary | ICD-10-CM | POA: Diagnosis not present

## 2015-05-06 DIAGNOSIS — Z955 Presence of coronary angioplasty implant and graft: Secondary | ICD-10-CM | POA: Insufficient documentation

## 2015-05-06 DIAGNOSIS — Z880 Allergy status to penicillin: Secondary | ICD-10-CM | POA: Insufficient documentation

## 2015-05-06 DIAGNOSIS — E11319 Type 2 diabetes mellitus with unspecified diabetic retinopathy without macular edema: Secondary | ICD-10-CM | POA: Insufficient documentation

## 2015-05-06 DIAGNOSIS — N183 Chronic kidney disease, stage 3 (moderate): Secondary | ICD-10-CM | POA: Insufficient documentation

## 2015-05-06 DIAGNOSIS — S52501A Unspecified fracture of the lower end of right radius, initial encounter for closed fracture: Principal | ICD-10-CM | POA: Insufficient documentation

## 2015-05-06 DIAGNOSIS — X58XXXA Exposure to other specified factors, initial encounter: Secondary | ICD-10-CM | POA: Insufficient documentation

## 2015-05-06 DIAGNOSIS — Z419 Encounter for procedure for purposes other than remedying health state, unspecified: Secondary | ICD-10-CM

## 2015-05-06 DIAGNOSIS — Z87891 Personal history of nicotine dependence: Secondary | ICD-10-CM | POA: Insufficient documentation

## 2015-05-06 DIAGNOSIS — I252 Old myocardial infarction: Secondary | ICD-10-CM | POA: Insufficient documentation

## 2015-05-06 DIAGNOSIS — E1122 Type 2 diabetes mellitus with diabetic chronic kidney disease: Secondary | ICD-10-CM | POA: Diagnosis not present

## 2015-05-06 DIAGNOSIS — I2510 Atherosclerotic heart disease of native coronary artery without angina pectoris: Secondary | ICD-10-CM | POA: Insufficient documentation

## 2015-05-06 DIAGNOSIS — E039 Hypothyroidism, unspecified: Secondary | ICD-10-CM | POA: Diagnosis not present

## 2015-05-06 DIAGNOSIS — Z7982 Long term (current) use of aspirin: Secondary | ICD-10-CM | POA: Diagnosis not present

## 2015-05-06 HISTORY — PX: OPEN REDUCTION INTERNAL FIXATION (ORIF) DISTAL RADIAL FRACTURE: SHX5989

## 2015-05-06 LAB — COMPREHENSIVE METABOLIC PANEL
ALT: 17 U/L (ref 14–54)
AST: 28 U/L (ref 15–41)
Albumin: 3.8 g/dL (ref 3.5–5.0)
Alkaline Phosphatase: 73 U/L (ref 38–126)
Anion gap: 14 (ref 5–15)
BUN: 23 mg/dL — ABNORMAL HIGH (ref 6–20)
CO2: 23 mmol/L (ref 22–32)
Calcium: 9.7 mg/dL (ref 8.9–10.3)
Chloride: 103 mmol/L (ref 101–111)
Creatinine, Ser: 1.22 mg/dL — ABNORMAL HIGH (ref 0.44–1.00)
GFR calc Af Amer: 55 mL/min — ABNORMAL LOW (ref >60)
GFR calc non Af Amer: 47 mL/min — ABNORMAL LOW (ref >60)
Glucose, Bld: 84 mg/dL (ref 65–99)
Potassium: 3.8 mmol/L (ref 3.5–5.1)
Sodium: 140 mmol/L (ref 135–145)
Total Bilirubin: 0.7 mg/dL (ref 0.3–1.2)
Total Protein: 7 g/dL (ref 6.5–8.1)

## 2015-05-06 LAB — CBC
HCT: 33.3 % — ABNORMAL LOW (ref 36.0–46.0)
Hemoglobin: 11 g/dL — ABNORMAL LOW (ref 12.0–15.0)
MCH: 28.9 pg (ref 26.0–34.0)
MCHC: 33 g/dL (ref 30.0–36.0)
MCV: 87.4 fL (ref 78.0–100.0)
Platelets: 392 10*3/uL (ref 150–400)
RBC: 3.81 MIL/uL — ABNORMAL LOW (ref 3.87–5.11)
RDW: 12.8 % (ref 11.5–15.5)
WBC: 7.3 10*3/uL (ref 4.0–10.5)

## 2015-05-06 LAB — GLUCOSE, CAPILLARY
Glucose-Capillary: 131 mg/dL — ABNORMAL HIGH (ref 65–99)
Glucose-Capillary: 68 mg/dL (ref 65–99)
Glucose-Capillary: 126 mg/dL — ABNORMAL HIGH (ref 65–99)
Glucose-Capillary: 194 mg/dL — ABNORMAL HIGH (ref 65–99)

## 2015-05-06 SURGERY — OPEN REDUCTION INTERNAL FIXATION (ORIF) DISTAL RADIUS FRACTURE
Anesthesia: Choice | Site: Arm Lower | Laterality: Right

## 2015-05-06 MED ORDER — OXYCODONE HCL 5 MG PO TABS
5.0000 mg | ORAL_TABLET | ORAL | Status: DC | PRN
  Administered 2015-05-06 – 2015-05-07 (×4): 5 mg via ORAL
  Filled 2015-05-06 (×4): qty 1

## 2015-05-06 MED ORDER — PROPOFOL 10 MG/ML IV BOLUS
INTRAVENOUS | Status: AC
  Filled 2015-05-06: qty 20

## 2015-05-06 MED ORDER — MIDAZOLAM HCL 2 MG/2ML IJ SOLN
INTRAMUSCULAR | Status: AC
  Administered 2015-05-06: 1 mg
  Filled 2015-05-06: qty 2

## 2015-05-06 MED ORDER — DIPHENHYDRAMINE HCL 25 MG PO CAPS
25.0000 mg | ORAL_CAPSULE | Freq: Four times a day (QID) | ORAL | Status: DC | PRN
  Administered 2015-05-07: 50 mg via ORAL
  Filled 2015-05-06: qty 2

## 2015-05-06 MED ORDER — KCL IN DEXTROSE-NACL 20-5-0.45 MEQ/L-%-% IV SOLN
INTRAVENOUS | Status: DC
  Administered 2015-05-06: 22:00:00 via INTRAVENOUS
  Filled 2015-05-06: qty 1000

## 2015-05-06 MED ORDER — ADULT MULTIVITAMIN W/MINERALS CH
1.0000 | ORAL_TABLET | Freq: Every day | ORAL | Status: DC
  Administered 2015-05-06 – 2015-05-07 (×2): 1 via ORAL
  Filled 2015-05-06 (×2): qty 1

## 2015-05-06 MED ORDER — DOCUSATE SODIUM 100 MG PO CAPS
100.0000 mg | ORAL_CAPSULE | Freq: Two times a day (BID) | ORAL | Status: DC
  Administered 2015-05-06 – 2015-05-07 (×2): 100 mg via ORAL
  Filled 2015-05-06 (×2): qty 1

## 2015-05-06 MED ORDER — ONDANSETRON HCL 4 MG/2ML IJ SOLN
INTRAMUSCULAR | Status: AC
  Filled 2015-05-06: qty 2

## 2015-05-06 MED ORDER — THYROID 30 MG PO TABS
15.0000 mg | ORAL_TABLET | Freq: Every day | ORAL | Status: DC
  Administered 2015-05-07: 15 mg via ORAL
  Filled 2015-05-06: qty 1

## 2015-05-06 MED ORDER — MIDAZOLAM HCL 2 MG/2ML IJ SOLN
INTRAMUSCULAR | Status: AC
  Filled 2015-05-06: qty 2

## 2015-05-06 MED ORDER — PANTOPRAZOLE SODIUM 40 MG PO TBEC
40.0000 mg | DELAYED_RELEASE_TABLET | Freq: Every day | ORAL | Status: DC
  Administered 2015-05-07 (×2): 40 mg via ORAL
  Filled 2015-05-06 (×2): qty 1

## 2015-05-06 MED ORDER — VANCOMYCIN HCL IN DEXTROSE 1-5 GM/200ML-% IV SOLN
INTRAVENOUS | Status: AC
  Administered 2015-05-06: 1000 mg via INTRAVENOUS
  Filled 2015-05-06: qty 200

## 2015-05-06 MED ORDER — ONDANSETRON HCL 4 MG PO TABS
4.0000 mg | ORAL_TABLET | Freq: Four times a day (QID) | ORAL | Status: DC | PRN
  Administered 2015-05-07: 4 mg via ORAL
  Filled 2015-05-06: qty 1

## 2015-05-06 MED ORDER — LIDOCAINE HCL (CARDIAC) 20 MG/ML IV SOLN
INTRAVENOUS | Status: AC
  Filled 2015-05-06: qty 5

## 2015-05-06 MED ORDER — MIDAZOLAM HCL 5 MG/5ML IJ SOLN
INTRAMUSCULAR | Status: DC | PRN
  Administered 2015-05-06 (×2): 1 mg via INTRAVENOUS

## 2015-05-06 MED ORDER — ALPRAZOLAM 0.5 MG PO TABS: 0.5000 mg | ORAL_TABLET | Freq: Four times a day (QID) | ORAL | Status: DC | PRN

## 2015-05-06 MED ORDER — LORATADINE 10 MG PO TABS
10.0000 mg | ORAL_TABLET | Freq: Every day | ORAL | Status: DC
  Administered 2015-05-07: 10 mg via ORAL
  Filled 2015-05-06: qty 1

## 2015-05-06 MED ORDER — METHOCARBAMOL 500 MG PO TABS
500.0000 mg | ORAL_TABLET | Freq: Four times a day (QID) | ORAL | Status: DC | PRN
  Administered 2015-05-07: 500 mg via ORAL
  Filled 2015-05-06 (×2): qty 1

## 2015-05-06 MED ORDER — ACYCLOVIR 400 MG PO TABS
400.0000 mg | ORAL_TABLET | Freq: Two times a day (BID) | ORAL | Status: DC
  Administered 2015-05-06 – 2015-05-07 (×2): 400 mg via ORAL
  Filled 2015-05-06 (×2): qty 1

## 2015-05-06 MED ORDER — DEXTROSE 50 % IV SOLN
INTRAVENOUS | Status: AC
  Administered 2015-05-06: 25 mL via INTRAVENOUS
  Filled 2015-05-06: qty 50

## 2015-05-06 MED ORDER — ALPHA-LIPOIC ACID 600 MG PO CAPS: 600.0000 mg | ORAL_CAPSULE | Freq: Two times a day (BID) | ORAL | Status: DC

## 2015-05-06 MED ORDER — HYDROMORPHONE HCL 1 MG/ML IJ SOLN
0.5000 mg | INTRAMUSCULAR | Status: DC | PRN
  Administered 2015-05-07 (×2): 1 mg via INTRAVENOUS
  Filled 2015-05-06 (×3): qty 1

## 2015-05-06 MED ORDER — VITAMIN D3 125 MCG (5000 UT) PO TABS: 5000.0000 [IU] | ORAL_TABLET | Freq: Every day | ORAL | Status: DC

## 2015-05-06 MED ORDER — FENTANYL CITRATE (PF) 100 MCG/2ML IJ SOLN
INTRAMUSCULAR | Status: AC
  Administered 2015-05-06: 50 ug
  Filled 2015-05-06: qty 2

## 2015-05-06 MED ORDER — VANCOMYCIN HCL IN DEXTROSE 1-5 GM/200ML-% IV SOLN: 1000.0000 mg | INTRAVENOUS | Status: DC

## 2015-05-06 MED ORDER — LEVOTHYROXINE SODIUM 25 MCG PO TABS
25.0000 ug | ORAL_TABLET | Freq: Every day | ORAL | Status: DC
  Administered 2015-05-07: 25 ug via ORAL
  Filled 2015-05-06: qty 1

## 2015-05-06 MED ORDER — ARGININE 500 MG PO TABS: 500.0000 mg | ORAL_TABLET | Freq: Every day | ORAL | Status: DC

## 2015-05-06 MED ORDER — NITROGLYCERIN 0.4 MG SL SUBL: 0.4000 mg | SUBLINGUAL_TABLET | SUBLINGUAL | Status: DC | PRN

## 2015-05-06 MED ORDER — POTASSIUM CHLORIDE CRYS ER 10 MEQ PO TBCR
10.0000 meq | EXTENDED_RELEASE_TABLET | Freq: Two times a day (BID) | ORAL | Status: DC
  Administered 2015-05-06 – 2015-05-07 (×2): 10 meq via ORAL
  Filled 2015-05-06 (×2): qty 1

## 2015-05-06 MED ORDER — METHOCARBAMOL 1000 MG/10ML IJ SOLN
500.0000 mg | Freq: Four times a day (QID) | INTRAVENOUS | Status: DC | PRN
  Filled 2015-05-06: qty 5

## 2015-05-06 MED ORDER — THYROID 16.25 MG PO TABS
1.0000 | ORAL_TABLET | Freq: Every day | ORAL | Status: DC
  Filled 2015-05-06: qty 1

## 2015-05-06 MED ORDER — LACTATED RINGERS IV SOLN
INTRAVENOUS | Status: DC
  Administered 2015-05-06 (×2): via INTRAVENOUS

## 2015-05-06 MED ORDER — PROPOFOL 500 MG/50ML IV EMUL
INTRAVENOUS | Status: DC | PRN
  Administered 2015-05-06: 75 ug/kg/min via INTRAVENOUS

## 2015-05-06 MED ORDER — DOCUSATE SODIUM 100 MG PO CAPS: 100.0000 mg | ORAL_CAPSULE | Freq: Two times a day (BID) | ORAL | Status: DC

## 2015-05-06 MED ORDER — INSULIN ASPART 100 UNIT/ML ~~LOC~~ SOLN: 0.0000 [IU] | Freq: Every day | SUBCUTANEOUS | Status: DC

## 2015-05-06 MED ORDER — NITROGLYCERIN 0.4 MG/SPRAY TL SOLN: 1.0000 | Status: DC | PRN

## 2015-05-06 MED ORDER — CYANOCOBALAMIN 1000 MCG/ML IJ KIT: 1000.0000 ug | PACK | INTRAMUSCULAR | Status: DC

## 2015-05-06 MED ORDER — VITAMIN C 500 MG PO TABS
1000.0000 mg | ORAL_TABLET | Freq: Every day | ORAL | Status: DC
  Administered 2015-05-06 – 2015-05-07 (×2): 1000 mg via ORAL
  Filled 2015-05-06 (×2): qty 2

## 2015-05-06 MED ORDER — CARVEDILOL 3.125 MG PO TABS
3.1250 mg | ORAL_TABLET | Freq: Two times a day (BID) | ORAL | Status: DC
  Administered 2015-05-07: 3.125 mg via ORAL
  Filled 2015-05-06: qty 1

## 2015-05-06 MED ORDER — FENTANYL CITRATE (PF) 100 MCG/2ML IJ SOLN
INTRAMUSCULAR | Status: DC | PRN
  Administered 2015-05-06: 50 ug via INTRAVENOUS

## 2015-05-06 MED ORDER — MELATONIN 500 MCG PO TBDP: 500.0000 ug | ORAL_TABLET | Freq: Every evening | ORAL | Status: DC | PRN

## 2015-05-06 MED ORDER — HYDROMORPHONE HCL 2 MG PO TABS: 2.0000 mg | ORAL_TABLET | ORAL | Status: DC | PRN

## 2015-05-06 MED ORDER — LINOLEIC ACID CONJUGATED 1000 MG PO CAPS: 1000.0000 mg | ORAL_CAPSULE | Freq: Every day | ORAL | Status: DC

## 2015-05-06 MED ORDER — CHLORHEXIDINE GLUCONATE 4 % EX LIQD: 60.0000 mL | Freq: Once | CUTANEOUS | Status: DC

## 2015-05-06 MED ORDER — ONDANSETRON HCL 4 MG/2ML IJ SOLN
4.0000 mg | Freq: Four times a day (QID) | INTRAMUSCULAR | Status: DC | PRN
  Administered 2015-05-07: 4 mg via INTRAVENOUS
  Filled 2015-05-06: qty 2

## 2015-05-06 MED ORDER — METHOCARBAMOL 500 MG PO TABS
500.0000 mg | ORAL_TABLET | Freq: Four times a day (QID) | ORAL | Status: DC
  Administered 2015-05-06 – 2015-05-07 (×2): 500 mg via ORAL
  Filled 2015-05-06 (×2): qty 1

## 2015-05-06 MED ORDER — ONDANSETRON HCL 4 MG PO TABS: 4.0000 mg | ORAL_TABLET | Freq: Three times a day (TID) | ORAL | Status: DC | PRN

## 2015-05-06 MED ORDER — INSULIN PUMP
SUBCUTANEOUS | Status: DC
Start: 2015-05-06 — End: 2015-05-07
  Filled 2015-05-06: qty 1

## 2015-05-06 MED ORDER — FENTANYL CITRATE (PF) 250 MCG/5ML IJ SOLN
INTRAMUSCULAR | Status: AC
  Filled 2015-05-06: qty 5

## 2015-05-06 MED ORDER — ISOSORBIDE MONONITRATE ER 60 MG PO TB24
120.0000 mg | ORAL_TABLET | Freq: Every day | ORAL | Status: DC
  Administered 2015-05-06: 120 mg via ORAL
  Filled 2015-05-06: qty 2

## 2015-05-06 MED ORDER — 0.9 % SODIUM CHLORIDE (POUR BTL) OPTIME
TOPICAL | Status: DC | PRN
  Administered 2015-05-06: 1000 mL

## 2015-05-06 MED ORDER — DEXTROSE 50 % IV SOLN
25.0000 mL | Freq: Once | INTRAVENOUS | Status: AC
Start: 2015-05-06 — End: 2015-05-06
  Administered 2015-05-06: 25 mL via INTRAVENOUS
  Filled 2015-05-06: qty 50

## 2015-05-06 MED ORDER — BUPIVACAINE HCL (PF) 0.25 % IJ SOLN
INTRAMUSCULAR | Status: AC
  Filled 2015-05-06: qty 30

## 2015-05-06 MED ORDER — ASPIRIN EC 325 MG PO TBEC
325.0000 mg | DELAYED_RELEASE_TABLET | Freq: Every day | ORAL | Status: DC
  Administered 2015-05-06: 325 mg via ORAL
  Filled 2015-05-06: qty 1

## 2015-05-06 MED ORDER — VITAMIN D 1000 UNITS PO TABS
5000.0000 [IU] | ORAL_TABLET | Freq: Every day | ORAL | Status: DC
  Administered 2015-05-07: 5000 [IU] via ORAL
  Filled 2015-05-06: qty 5

## 2015-05-06 MED ORDER — CYANOCOBALAMIN 1000 MCG/ML IJ SOLN: 1000.0000 ug | INTRAMUSCULAR | Status: DC

## 2015-05-06 MED ORDER — VITAMIN C 500 MG PO TABS: 500.0000 mg | ORAL_TABLET | Freq: Every day | ORAL | Status: DC

## 2015-05-06 MED ORDER — CLINDAMYCIN PHOSPHATE 600 MG/50ML IV SOLN
600.0000 mg | Freq: Three times a day (TID) | INTRAVENOUS | Status: DC
Start: 2015-05-06 — End: 2015-05-07
  Administered 2015-05-06 – 2015-05-07 (×2): 600 mg via INTRAVENOUS
  Filled 2015-05-06 (×4): qty 50

## 2015-05-06 SURGICAL SUPPLY — 63 items
BANDAGE ACE 4X5 VEL STRL LF (GAUZE/BANDAGES/DRESSINGS) ×2 IMPLANT
BANDAGE ELASTIC 3 VELCRO ST LF (GAUZE/BANDAGES/DRESSINGS) ×2 IMPLANT
BANDAGE ELASTIC 4 VELCRO ST LF (GAUZE/BANDAGES/DRESSINGS) ×2 IMPLANT
BIT DRILL 2.2 SS TIBIAL (BIT) ×2 IMPLANT
BLADE SURG ROTATE 9660 (MISCELLANEOUS) IMPLANT
BNDG ELASTIC 2X5.8 VLCR STR LF (GAUZE/BANDAGES/DRESSINGS) ×2 IMPLANT
BNDG ESMARK 4X9 LF (GAUZE/BANDAGES/DRESSINGS) ×2 IMPLANT
BNDG GAUZE ELAST 4 BULKY (GAUZE/BANDAGES/DRESSINGS) ×2 IMPLANT
CANISTER SUCTION 2500CC (MISCELLANEOUS) ×2 IMPLANT
CORDS BIPOLAR (ELECTRODE) ×2 IMPLANT
COVER SURGICAL LIGHT HANDLE (MISCELLANEOUS) ×2 IMPLANT
CUFF TOURNIQUET SINGLE 18IN (TOURNIQUET CUFF) ×2 IMPLANT
CUFF TOURNIQUET SINGLE 24IN (TOURNIQUET CUFF) IMPLANT
DECANTER SPIKE VIAL GLASS SM (MISCELLANEOUS) ×2 IMPLANT
DRAPE OEC MINIVIEW 54X84 (DRAPES) ×2 IMPLANT
DRAPE SURG 17X11 SM STRL (DRAPES) ×2 IMPLANT
DRSG ADAPTIC 3X8 NADH LF (GAUZE/BANDAGES/DRESSINGS) ×2 IMPLANT
GAUZE SPONGE 4X4 12PLY STRL (GAUZE/BANDAGES/DRESSINGS) ×2 IMPLANT
GAUZE SPONGE 4X4 16PLY XRAY LF (GAUZE/BANDAGES/DRESSINGS) ×2 IMPLANT
GLOVE BIOGEL PI IND STRL 8.5 (GLOVE) ×1 IMPLANT
GLOVE BIOGEL PI INDICATOR 8.5 (GLOVE) ×1
GLOVE SURG ORTHO 8.0 STRL STRW (GLOVE) ×2 IMPLANT
GOWN STRL REUS W/ TWL LRG LVL3 (GOWN DISPOSABLE) ×2 IMPLANT
GOWN STRL REUS W/ TWL XL LVL3 (GOWN DISPOSABLE) ×1 IMPLANT
GOWN STRL REUS W/TWL LRG LVL3 (GOWN DISPOSABLE) ×2
GOWN STRL REUS W/TWL XL LVL3 (GOWN DISPOSABLE) ×1
K-WIRE 1.6 (WIRE) ×1
K-WIRE FX5X1.6XNS BN SS (WIRE) ×1
KIT BASIN OR (CUSTOM PROCEDURE TRAY) ×2 IMPLANT
KIT ROOM TURNOVER OR (KITS) ×2 IMPLANT
KWIRE FX5X1.6XNS BN SS (WIRE) ×1 IMPLANT
NEEDLE HYPO 25X1 1.5 SAFETY (NEEDLE) ×2 IMPLANT
NS IRRIG 1000ML POUR BTL (IV SOLUTION) ×2 IMPLANT
PACK ORTHO EXTREMITY (CUSTOM PROCEDURE TRAY) ×2 IMPLANT
PAD ARMBOARD 7.5X6 YLW CONV (MISCELLANEOUS) ×4 IMPLANT
PAD CAST 3X4 CTTN HI CHSV (CAST SUPPLIES) ×1 IMPLANT
PAD CAST 4YDX4 CTTN HI CHSV (CAST SUPPLIES) ×1 IMPLANT
PADDING CAST COTTON 3X4 STRL (CAST SUPPLIES) ×1
PADDING CAST COTTON 4X4 STRL (CAST SUPPLIES) ×1
PEG LOCKING SMOOTH 2.2X16 (Screw) ×2 IMPLANT
PEG LOCKING SMOOTH 2.2X18 (Peg) ×6 IMPLANT
PEG LOCKING SMOOTH 2.2X20 (Screw) ×2 IMPLANT
PLATE NARROW DVR RIGHT (Plate) ×2 IMPLANT
SCREW LOCK 12X2.7X 3 LD (Screw) ×3 IMPLANT
SCREW LOCK 14X2.7X 3 LD TPR (Screw) ×2 IMPLANT
SCREW LOCKING 2.7X12MM (Screw) ×3 IMPLANT
SCREW LOCKING 2.7X14 (Screw) ×2 IMPLANT
SLING ARM FOAM STRAP MED (SOFTGOODS) ×2 IMPLANT
SOAP 2 % CHG 4 OZ (WOUND CARE) ×2 IMPLANT
SPLINT FIBERGLASS 3X35 (CAST SUPPLIES) ×2 IMPLANT
SPONGE GAUZE 4X4 12PLY STER LF (GAUZE/BANDAGES/DRESSINGS) ×2 IMPLANT
SPONGE LAP 4X18 X RAY DECT (DISPOSABLE) ×2 IMPLANT
SUT VIC AB 2-0 CT1 27 (SUTURE) ×1
SUT VIC AB 2-0 CT1 TAPERPNT 27 (SUTURE) ×1 IMPLANT
SUT VIC AB 2-0 FS1 27 (SUTURE) IMPLANT
SUT VICRYL 4-0 PS2 18IN ABS (SUTURE) ×2 IMPLANT
SUT VICRYL RAPIDE 4/0 PS 2 (SUTURE) ×2 IMPLANT
SYR CONTROL 10ML LL (SYRINGE) IMPLANT
TOWEL OR 17X24 6PK STRL BLUE (TOWEL DISPOSABLE) ×2 IMPLANT
TOWEL OR 17X26 10 PK STRL BLUE (TOWEL DISPOSABLE) ×2 IMPLANT
TUBE CONNECTING 12X1/4 (SUCTIONS) ×2 IMPLANT
WATER STERILE IRR 1000ML POUR (IV SOLUTION) ×2 IMPLANT
YANKAUER SUCT BULB TIP NO VENT (SUCTIONS) IMPLANT

## 2015-05-06 NOTE — H&P (Signed)
Brittney Pace is an 60 y.o. female.   Chief Complaint: RIGHT DISTAL RADIUS INJURY AFTER FALL HPI: PT SEEN/EVALUATED IN OFFICE PT SUSTAINED CLOSED RIGHT RADIUS FRACTURE PT WITH LEFT DISTAL RADIUS ORIF PREVIOUSLY   Past Medical History  Diagnosis Date  . Diabetes mellitus without complication (HCC)     Type 1  . Idiopathic edema   . IBS (irritable bowel syndrome)   . Diabetic retinopathy (HCC)   . Anemia   . Chronic kidney disease     stage 3  . Lyme disease   . Babesiasis     secondary due to lyme disease  . Hypothyroidism   . Peripheral neuropathy (HCC)   . Mitral valve prolapse   . Osteoporosis   . GERD (gastroesophageal reflux disease)   . Gastroparesis   . Fibromyalgia   . 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 Coronary artery disease   . Peripheral vascular disease (HCC)   . Depression   . Headache     migraines  . Arthritis   . Myocardial infarction (HCC)     1 major in 1999 and 2 minor " small vessel disease."  . 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 Family history of adverse reaction to anesthesia     mother: " while she was under she stopped breathing."    Past Surgical History  Procedure Laterality Date  . Coronary artery bypass graft    . Cardiac catheterization    . Abdominal hysterectomy    . Coronary artery stents      at [facility] and [facility]  . Cataract extraction w/ intraocular lens implant      right eye  . Appendectomy    . Colonoscopy w/ biopsies and polypectomy    . Breast surgery      B/L biopsy and lumpectomy   . Ectopic pregnancy surgery    . Orif wrist fracture Left 11/05/2014  . Orif wrist fracture Left 11/05/2014    Procedure: OPEN REDUCTION INTERNAL FIXATION (ORIF) LEFT WRIST FRACTURE AND REPAIR AS INDICATED;  Surgeon: Bradly Bienenstock, MD;  Location: [facility] OR;  Service: Orthopedics;  Laterality: Left;    Family History  Problem Relation Age of Onset  . Breast cancer  Mother   . Heart disease Father   . Hypertension Father   . Breast cancer Sister    Social History:  reports that she has quit smoking. Her smoking use included Cigarettes. She has never used smokeless tobacco. She reports that she drinks alcohol. She reports that she does not use illicit drugs.  Allergies:  Allergies  Allergen Reactions  . Dilaudid [Hydromorphone] Other (See Comments)    Because of continuous glucose monitor  . Morphine Anaphylaxis and Shortness Of Breath  . Morphine And Related Shortness Of Breath  . Penicillins Hives    Has patient had a PCN reaction causing immediate rash, facial/tongue/throat swelling, SOB or lightheadedness with hypotension: Yes Has patient had a PCN reaction causing severe rash involving mucus membranes or skin necrosis: No Has patient had a PCN reaction that required hospitalization No Has patient had a PCN reaction occurring within the last 10 years: No If all of the above answers are "NO", then may proceed with Cephalosporin use.  . Tramadol Anaphylaxis  . Amitriptyline Other (See Comments)    Severe headache/ out of body feeling  . Codeine Other (See Comments)  Severe headaches/ out of body feeling  . Darvon [Propoxyphene] Other (See Comments)    Severe headaches / out of body feeling  . Other Swelling and Other (See Comments)    Cats itching Mold sinuses Cats itching Mold sinuses Cat hair and scratches cause swelling  . Statins Other (See Comments)    Muscle pains  . Sulfamethoxazole Other (See Comments)  . Acetaminophen Other (See Comments)    Alters insulin pump readings  . Advil [Ibuprofen] Other (See Comments)    Messes up CGM reading on glucose monitor  . Gluten Meal Diarrhea and Nausea And Vomiting    Cramping  . Mold Extract [Trichophyton] Other (See Comments)    sinusitis  . Sulfa Antibiotics Other (See Comments)    Mouth ulcers  . Sulfites Other (See Comments)    Mouth ulcers  . Ultracet [Tramadol-Acetaminophen]  Other (See Comments)    Small vessel heart attack  . Wheat Bran Diarrhea and Nausea And Vomiting    Cramping    Medications Prior to Admission  Medication Sig Dispense Refill  . acyclovir (ZOVIRAX) 400 MG tablet Take 400 mg by mouth 2 (two) times daily.    . Alpha-Lipoic Acid 600 MG CAPS Take 600 mg by mouth 2 (two) times daily.     Marland Kitchen arginine 500 MG tablet Take 500 mg by mouth daily.    Marland Kitchen aspirin EC 325 MG tablet Take 325 mg by mouth at bedtime.    . carvedilol (COREG) 3.125 MG tablet Take 1 tablet (3.125 mg total) by mouth 2 (two) times daily with a meal. 180 tablet 1  . cetirizine (ZYRTEC) 10 MG tablet Take 10 mg by mouth at bedtime.    . Cholecalciferol (VITAMIN D3) 5000 UNITS TABS Take 5,000 Units by mouth daily.     . Cyanocobalamin (VITAMIN DEFICIENCY SYSTEM-B12) 1000 MCG/ML KIT Inject 1,000 mcg as directed every 3 (three) days.    Marland Kitchen docusate sodium (COLACE) 100 MG capsule Take 1 capsule (100 mg total) by mouth 2 (two) times daily. 30 capsule 0  . Esomeprazole Magnesium (NEXIUM PO) Take 20 mg by mouth every morning.     . Insulin Human (INSULIN PUMP) SOLN Inject into the skin continuous. Novolog insulin    . isosorbide mononitrate (IMDUR) 120 MG 24 hr tablet Take 120 mg by mouth at bedtime.    Marland Kitchen levothyroxine (SYNTHROID, LEVOTHROID) 25 MCG tablet Take 25 mcg by mouth daily before breakfast.    . Linoleic Acid Conjugated 1000 MG CAPS Take 1,000 mg by mouth daily.     . Melatonin 500 MCG TBDP Take 500 mcg by mouth at bedtime as needed (sleep).     . methocarbamol (ROBAXIN) 500 MG tablet Take 1 tablet (500 mg total) by mouth 4 (four) times daily. 30 tablet 0  . nitroGLYCERIN (NITROLINGUAL) 0.4 MG/SPRAY spray Place 1 spray under the tongue every 5 (five) minutes x 3 doses as needed for chest pain. 12 g 1  . NOVOLOG 100 UNIT/ML injection Inject 0-25 Units into the skin daily. In insulin pump  6  . OIL OF OREGANO PO Take 1 capsule by mouth 2 (two) times daily.     . ondansetron (ZOFRAN) 4  MG tablet Take 1 tablet (4 mg total) by mouth every 8 (eight) hours as needed for nausea or vomiting. 20 tablet 0  . OVER THE COUNTER MEDICATION Take 2.9 g by mouth at bedtime. SeroVital HGH    . OVER THE COUNTER MEDICATION Take 750 mg by mouth at  bedtime. GABA    . oxyCODONE (OXY IR/ROXICODONE) 5 MG immediate release tablet Take 5 mg by mouth every 4 (four) hours as needed for severe pain.     . potassium chloride (K-DUR,KLOR-CON) 10 MEQ tablet Take 1 tablet (10 mEq total) by mouth 2 (two) times daily. 60 tablet 1  . Thyroid 16.25 MG TABS Take 1 tablet by mouth daily before breakfast.    . torsemide ([name]) 20 MG tablet Take 1 tablet by mouth every morning, may take an extra 1/2 tablet  as needed 135 tablet 1  . OVER THE COUNTER MEDICATION Take 2 g by mouth daily. L-glutamine powder      Results for orders placed or performed during the hospital encounter of 05/06/15 (from the past 48 hour(s))  CBC     Status: Abnormal   Collection Time: 05/06/15  4:10 PM  Result Value Ref Range   WBC 7.3 4.0 - 10.5 K/uL   RBC 3.81 (L) 3.87 - 5.11 MIL/uL   Hemoglobin 11.0 (L) 12.0 - 15.0 g/dL   HCT 66.7 (L) 04.4 - 24.8 %   MCV 87.4 78.0 - 100.0 fL   MCH 28.9 26.0 - 34.0 pg   MCHC 33.0 30.0 - 36.0 g/dL   RDW 31.2 66.8 - 58.2 %   Platelets 392 150 - 400 K/uL  Comprehensive metabolic panel     Status: Abnormal   Collection Time: 05/06/15  4:10 PM  Result Value Ref Range   Sodium 140 135 - 145 mmol/L   Potassium 3.8 3.5 - 5.1 mmol/L   Chloride 103 101 - 111 mmol/L   CO2 23 22 - 32 mmol/L   Glucose, Bld 84 65 - 99 mg/dL   BUN 23 (H) 6 - 20 mg/dL   Creatinine, Ser 6.85 (H) 0.44 - 1.00 mg/dL   Calcium 9.7 8.9 - 88.7 mg/dL   Total Protein 7.0 6.5 - 8.1 g/dL   Albumin 3.8 3.5 - 5.0 g/dL   AST 28 15 - 41 U/L   ALT 17 14 - 54 U/L   Alkaline Phosphatase 73 38 - 126 U/L   Total Bilirubin 0.7 0.3 - 1.2 mg/dL   GFR calc non Af Amer 47 (L) >60 mL/min   GFR calc Af Amer 55 (L) >60 mL/min    Comment:  (NOTE) The eGFR has been calculated using the CKD EPI equation. This calculation has not been validated in all clinical situations. eGFR's persistently <60 mL/min signify possible Chronic Kidney Disease.    Anion gap 14 5 - 15  Glucose, capillary     Status: None   Collection Time: 05/06/15  4:36 PM  Result Value Ref Range   Glucose-Capillary 68 65 - 99 mg/dL   No results found.  ROS NO RECENT ILLNESSES OR HOSPITALIZATIONS  Blood pressure 145/64, pulse 68, temperature 97.7 F (36.5 C), temperature source Oral, resp. rate 18, height 4' 11.5" (1.511 m), weight 47.628 kg (105 lb), SpO2 100 %. Physical Exam  General Appearance:  Alert, cooperative, no distress, appears stated age  Head:  Normocephalic, without obvious abnormality, atraumatic  Eyes:  Pupils equal, conjunctiva/corneas clear,         Throat: Lips, mucosa, and tongue normal; teeth and gums normal  Neck: No visible masses     Lungs:   respirations unlabored  Chest Wall:  No tenderness or deformity  Heart:  Regular rate and rhythm,  Abdomen:   Soft, non-tender,         Extremities: RIGHT UE: SUGARTONG  SPLINT FINGERS WARM WELL PERFUSED ABLE TO WIGGLE FINGERS  Pulses: 2+ and symmetric  Skin: Skin color, texture, turgor normal, no rashes or lesions     Neurologic: Normal    Assessment/Plan RIGHT DISTAL RADIUS FRACTURE, DISPLACED INTRA-ARTICULAR  RIGHT DISTAL RADIUS OPEN REDUCTION AND INTERNAL FIXATION  R/B/A DISCUSSED WITH PT IN OFFICE.  PT VOICED UNDERSTANDING OF PLAN CONSENT SIGNED DAY OF SURGERY PT SEEN AND EXAMINED PRIOR TO OPERATIVE PROCEDURE/DAY OF SURGERY SITE MARKED. QUESTIONS ANSWERED WILL REMAIN OVERNIGHT FOLLOWING SURGERY   WE ARE PLANNING SURGERY FOR YOUR UPPER EXTREMITY. THE RISKS AND BENEFITS OF SURGERY INCLUDE BUT NOT LIMITED TO BLEEDING INFECTION, DAMAGE TO NEARBY NERVES ARTERIES TENDONS, FAILURE OF SURGERY TO ACCOMPLISH ITS INTENDED GOALS, PERSISTENT SYMPTOMS AND NEED FOR FURTHER SURGICAL  INTERVENTION. WITH THIS IN MIND WE WILL PROCEED. I HAVE DISCUSSED WITH THE PATIENT THE PRE AND POSTOPERATIVE REGIMEN AND THE DOS AND DON'TS. PT VOICED UNDERSTANDING AND INFORMED CONSENT SIGNED.  Linna Hoff 05/06/2015, 5:56 PM

## 2015-05-06 NOTE — Progress Notes (Signed)
Rhonda, Diabetes Coordinator called and informed that pt reports possibility of staying overnight and wears an insulin pump. [phone]

## 2015-05-06 NOTE — Progress Notes (Signed)
Pharmacy Antibiotic Note  Brittney Pace is a 60 y.o. female admitted on 05/06/2015 with post-op ortho hand surgery.  Pharmacy has been consulted for clindamycin dosing. Pt here for ORIF of R distal radius fracture.   Plan: Clindamycin 600mg  IV q8h  Monitor clinical course and LOT  Height: 4' 11.5" (151.1 cm) Weight: 105 lb (47.628 kg) IBW/kg (Calculated) : 44.35  Temp (24hrs), Avg:97.9 F (36.6 C), Min:97.7 F (36.5 C), Max:98 F (36.7 C)   Recent Labs Lab 05/06/15 1610  WBC 7.3  CREATININE 1.22*    Estimated Creatinine Clearance: 34.4 mL/min (by C-G formula based on Cr of 1.22).    Allergies  Allergen Reactions  . Dilaudid [Hydromorphone] Other (See Comments)    Because of continuous glucose monitor  . Morphine Anaphylaxis and Shortness Of Breath  . Morphine And Related Shortness Of Breath  . Penicillins Hives    Has patient had a PCN reaction causing immediate rash, facial/tongue/throat swelling, SOB or lightheadedness with hypotension: Yes Has patient had a PCN reaction causing severe rash involving mucus membranes or skin necrosis: No Has patient had a PCN reaction that required hospitalization No Has patient had a PCN reaction occurring within the last 10 years: No If all of the above answers are "NO", then may proceed with Cephalosporin use.  . Tramadol Anaphylaxis  . Amitriptyline Other (See Comments)    Severe headache/ out of body feeling  . Codeine Other (See Comments)    Severe headaches/ out of body feeling  . Darvon [Propoxyphene] Other (See Comments)    Severe headaches / out of body feeling  . Other Swelling and Other (See Comments)    Cats itching Mold sinuses Cats itching Mold sinuses Cat hair and scratches cause swelling  . Statins Other (See Comments)    Muscle pains  . Sulfamethoxazole Other (See Comments)  . Acetaminophen Other (See Comments)    Alters insulin pump readings  . Advil [Ibuprofen] Other (See Comments)    Messes up CGM  reading on glucose monitor  . Gluten Meal Diarrhea and Nausea And Vomiting    Cramping  . Mold Extract [Trichophyton] Other (See Comments)    sinusitis  . Sulfa Antibiotics Other (See Comments)    Mouth ulcers  . Sulfites Other (See Comments)    Mouth ulcers  . Ultracet [Tramadol-Acetaminophen] Other (See Comments)    Small vessel heart attack  . Wheat Bran Diarrhea and Nausea And Vomiting    Cramping    Antimicrobials this admission: Clindamycin 4/12 >>    >>   Dose adjustments this admission: n/a  Microbiology results:  BCx:   UCx:    Sputum:    MRSA PCR:    Andrey Cota. Diona Foley, PharmD, [facility] [facility] Clinical Pharmacist Pager [phone] 05/06/2015 8:46 PM

## 2015-05-06 NOTE — Brief Op Note (Signed)
05/06/2015  6:04 PM  PATIENT:  Brittney Pace  60 y.o. female  PRE-OPERATIVE DIAGNOSIS:  right distal radius fracture  POST-OPERATIVE DIAGNOSIS:  * No post-op diagnosis entered *  PROCEDURE:  Procedure(s): OPEN REDUCTION INTERNAL FIXATION (ORIF) RIGHT DISTAL RADIAL FRACTURE AND REPAIRS AS NEEDED (Right)  SURGEON:  Surgeon(s) and Role:    * Iran Planas, MD - Primary  PHYSICIAN ASSISTANT:   ASSISTANTS: none   ANESTHESIA:   general  EBL:     BLOOD ADMINISTERED:none  DRAINS: none   LOCAL MEDICATIONS USED:  NONE  SPECIMEN:  No Specimen  DISPOSITION OF SPECIMEN:  N/A  COUNTS:  YES  TOURNIQUET:    DICTATION: .Other Dictation: Dictation Number [id number]  PLAN OF CARE: Admit for overnight observation  PATIENT DISPOSITION:  PACU - hemodynamically stable.   Delay start of Pharmacological VTE agent (>24hrs) due to surgical blood loss or risk of bleeding: not applicable

## 2015-05-06 NOTE — Progress Notes (Signed)
Insulin pump disconnected by pt at 1600, 1605 CBG-84, 1635 CBG-68. Dr. Marcie Bal called with 1/2 amp IV D50 given at this time, will monitor.

## 2015-05-06 NOTE — Telephone Encounter (Signed)
Surgical clearance faxed and also routed to Dr. Caralyn Guile via [facility]

## 2015-05-06 NOTE — Progress Notes (Signed)
PHARMACIST - PHYSICIAN ORDER COMMUNICATION  CONCERNING: P&T Medication Policy on Herbal Medications  DESCRIPTION:  This patient's order for:  Alpha-Lipoic Acid, Arginine, Linoleic Acid  has been noted.  This product(s) is classified as an "herbal" or natural product. Due to a lack of definitive safety studies or FDA approval, nonstandard manufacturing practices, plus the potential risk of unknown drug-drug interactions while on inpatient medications, the Pharmacy and Therapeutics Committee does not permit the use of "herbal" or natural products of this type within [facility].   ACTION TAKEN: The pharmacy department is unable to verify this order at this time and your patient has been informed of this safety policy. Please reevaluate patient's clinical condition at discharge and address if the herbal or natural product(s) should be resumed at that time.   Boleslaus Holloway S. Alford Highland, PharmD, [place] Clinical Staff Pharmacist Pager [phone]

## 2015-05-06 NOTE — Anesthesia Preprocedure Evaluation (Addendum)
Anesthesia Evaluation  Patient identified by MRN, date of birth, ID band Patient awake    Reviewed: Allergy & Precautions, NPO status , Patient's Chart, lab work & pertinent test results  Airway Mallampati: II  TM Distance: >3 FB Neck ROM: Full    Dental  (+) Teeth Intact, Dental Advisory Given   Pulmonary former smoker,    breath sounds clear to auscultation       Cardiovascular  Rhythm:Regular Rate:Normal     Neuro/Psych    GI/Hepatic   Endo/Other  diabetes  Renal/GU      Musculoskeletal   Abdominal   Peds  Hematology   Anesthesia Other Findings   Reproductive/Obstetrics                            Anesthesia Physical Anesthesia Plan  ASA: III  Anesthesia Plan: MAC and Regional   Post-op Pain Management:  Regional for Post-op pain   Induction:   Airway Management Planned: Nasal Cannula  Additional Equipment:   Intra-op Plan:   Post-operative Plan:   Informed Consent: I have reviewed the patients History and Physical, chart, labs and discussed the procedure including the risks, benefits and alternatives for the proposed anesthesia with the patient or authorized representative who has indicated his/her understanding and acceptance.   Dental advisory given  Plan Discussed with: Anesthesiologist and CRNA  Anesthesia Plan Comments:         Anesthesia Quick Evaluation

## 2015-05-06 NOTE — Progress Notes (Signed)
Sarah, CRNA at bedside preparing to take pt back to OR.

## 2015-05-06 NOTE — Discharge Instructions (Signed)
KEEP BANDAGE CLEAN AND DRY CALL OFFICE FOR F/U APPT [phone] IN 14 DAYS KEEP HAND ELEVATED ABOVE HEART OK TO APPLY ICE TO OPERATIVE AREA CONTACT OFFICE IF ANY WORSENING PAIN OR CONCERNS. 

## 2015-05-06 NOTE — Transfer of Care (Signed)
Immediate Anesthesia Transfer of Care Note  Patient: Brittney Pace  Procedure(s) Performed: Procedure(s): OPEN REDUCTION INTERNAL FIXATION (ORIF) RIGHT DISTAL RADIAL FRACTURE AND REPAIRS AS NEEDED (Right)  Patient Location: PACU  Anesthesia Type:MAC combined with regional for post-op pain  Level of Consciousness: awake, alert  and oriented  Airway & Oxygen Therapy: Patient connected to nasal cannula oxygen  Post-op Assessment: Report given to RN and Post -op Vital signs reviewed and stable  Post vital signs: Reviewed and stable  Last Vitals:  Filed Vitals:   05/06/15 1755 05/06/15 1927  BP: 147/54 125/72  Pulse: 65 73  Temp:  36.7 C  Resp: 16 12    Complications: No apparent anesthesia complications

## 2015-05-07 ENCOUNTER — Encounter (HOSPITAL_COMMUNITY): Payer: Self-pay | Admitting: Orthopedic Surgery

## 2015-05-07 DIAGNOSIS — S52501A Unspecified fracture of the lower end of right radius, initial encounter for closed fracture: Secondary | ICD-10-CM | POA: Diagnosis not present

## 2015-05-07 LAB — GLUCOSE, CAPILLARY
Glucose-Capillary: 84 mg/dL (ref 65–99)
Glucose-Capillary: 341 mg/dL — ABNORMAL HIGH (ref 65–99)

## 2015-05-07 NOTE — Progress Notes (Signed)
Pharmacy Antibiotic Note  Brittney Pace is a 60 y.o. female admitted on 05/06/2015 with post-op ortho hand surgery.  Pharmacy has been consulted for clindamycin dosing.  Pt here for ORIF of R distal radius fracture.   Plan: Clindamycin 600mg  IV q8h  Pharmacy will sign off as dosage adjustment is unnecessary.  Thank you for the consult!  Height: 4' 11.5" (151.1 cm) Weight: 105 lb (47.628 kg) IBW/kg (Calculated) : 44.35  Temp (24hrs), Avg:98.3 F (36.8 C), Min:97.7 F (36.5 C), Max:99.8 F (37.7 C)   Recent Labs Lab 05/06/15 1610  WBC 7.3  CREATININE 1.22*    Estimated Creatinine Clearance: 34.4 mL/min (by C-G formula based on Cr of 1.22).    Allergies  Allergen Reactions  . Dilaudid [Hydromorphone] Other (See Comments)    Because of continuous glucose monitor  . Morphine Anaphylaxis and Shortness Of Breath  . Morphine And Related Shortness Of Breath  . Penicillins Hives    Has patient had a PCN reaction causing immediate rash, facial/tongue/throat swelling, SOB or lightheadedness with hypotension: Yes Has patient had a PCN reaction causing severe rash involving mucus membranes or skin necrosis: No Has patient had a PCN reaction that required hospitalization No Has patient had a PCN reaction occurring within the last 10 years: No If all of the above answers are "NO", then may proceed with Cephalosporin use.  . Tramadol Anaphylaxis  . Amitriptyline Other (See Comments)    Severe headache/ out of body feeling  . Codeine Other (See Comments)    Severe headaches/ out of body feeling  . Darvon [Propoxyphene] Other (See Comments)    Severe headaches / out of body feeling  . Other Swelling and Other (See Comments)    Cats itching Mold sinuses Cats itching Mold sinuses Cat hair and scratches cause swelling  . Statins Other (See Comments)    Muscle pains  . Sulfamethoxazole Other (See Comments)  . Acetaminophen Other (See Comments)    Alters insulin pump readings  .  Advil [Ibuprofen] Other (See Comments)    Messes up CGM reading on glucose monitor  . Gluten Meal Diarrhea and Nausea And Vomiting    Cramping  . Mold Extract [Trichophyton] Other (See Comments)    sinusitis  . Sulfa Antibiotics Other (See Comments)    Mouth ulcers  . Sulfites Other (See Comments)    Mouth ulcers  . Ultracet [Tramadol-Acetaminophen] Other (See Comments)    Small vessel heart attack  . Wheat Bran Diarrhea and Nausea And Vomiting    Cramping     Olan Kurek D. Mina Marble, PharmD, BCPS Pager:  [phone] 05/07/2015, 11:10 AM

## 2015-05-07 NOTE — Discharge Summary (Signed)
Physician Discharge Summary  Patient ID: Brittney Pace MRN: [id number] DOB/AGE: 1955-03-11 60 y.o.  Admit date: 05/06/2015 Discharge date: 05/07/2015  Admission Diagnoses: right distal radius fracture Past Medical History  Diagnosis Date  . Diabetes mellitus without complication (HCC)     Type 1  . Idiopathic edema   . IBS (irritable bowel syndrome)   . Diabetic retinopathy ([place])   . Anemia   . Chronic kidney disease     stage 3  . Lyme disease   . Babesiasis     secondary due to lyme disease  . Hypothyroidism   . Peripheral neuropathy ([place])   . Mitral valve prolapse   . Osteoporosis   . GERD (gastroesophageal reflux disease)   . Gastroparesis   . Fibromyalgia   . 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 Coronary artery disease   . Peripheral vascular disease ([place])   . Depression   . Headache     migraines  . Arthritis   . Myocardial infarction ([place])     1 major in 1999 and 2 minor " small vessel disease."  . 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 Family history of adverse reaction to anesthesia     mother: " while she was under she stopped breathing."    Discharge Diagnoses:  Active Problems:   Surgery, elective   Surgeries: Procedure(s): OPEN REDUCTION INTERNAL FIXATION (ORIF) RIGHT DISTAL RADIAL FRACTURE AND REPAIRS AS NEEDED on 05/06/2015    Consultants:    Discharged Condition: Improved  Hospital Course: Brittney Pace is an 60 y.o. female who was admitted 05/06/2015 with a chief complaint of No chief complaint on file. , and found to have a diagnosis of right distal radius fracture.  They were brought to the operating room on 05/06/2015 and underwent Procedure(s): OPEN REDUCTION INTERNAL FIXATION (ORIF) RIGHT DISTAL RADIAL FRACTURE AND REPAIRS AS NEEDED.    They were given perioperative antibiotics: Anti-infectives    Start     Dose/Rate Route Frequency Ordered Stop   05/06/15  2200  acyclovir (ZOVIRAX) tablet 400 mg     400 mg Oral 2 times daily 05/06/15 1818     05/06/15 2130  clindamycin (CLEOCIN) IVPB 600 mg     600 mg 100 mL/hr over 30 Minutes Intravenous Every 8 hours 05/06/15 2052     05/06/15 1615  vancomycin (VANCOCIN) IVPB 1000 mg/200 mL premix  Status:  Discontinued     1,000 mg 200 mL/hr over 60 Minutes Intravenous To ShortStay Surgical 05/06/15 1552 05/06/15 2010   05/06/15 1555  vancomycin (VANCOCIN) 1 GM/200ML IVPB    Comments:  Brittney Pace   : cabinet override      05/06/15 1555 05/06/15 1904    .  They were given sequential compression devices, early ambulation, and Other (comment) for DVT prophylaxis.  Recent vital signs: Patient Vitals for the past 24 hrs:  BP Temp Temp src Pulse Resp SpO2 Height Weight  05/07/15 1237 (!) 142/69 mmHg 97.6 F (36.4 C) Oral - - 98 % - -  05/07/15 0300 (!) 135/52 mmHg 99.8 F (37.7 C) Oral 83 18 99 % - -  05/07/15 0100 132/60 mmHg 98 F (36.7 C) Oral 67 - 98 % - -  05/06/15 2100 132/61 mmHg 98.1 F (36.7 C) Oral 66 16 97 % - -  05/06/15 2003 132/65 mmHg 98 F (36.7 C)  Oral 66 13 100 % - -  05/06/15 2000 (!) 112/59 mmHg - - 73 15 100 % - -  05/06/15 1945 121/60 mmHg - - 66 11 100 % - -  05/06/15 1927 125/72 mmHg 98 F (36.7 C) - 73 12 100 % - -  05/06/15 1755 (!) 147/54 mmHg - - 65 16 100 % - -  05/06/15 1616 (!) 145/64 mmHg 97.7 F (36.5 C) Oral 68 18 100 % 4' 11.5" (1.511 m) 47.628 kg (105 lb)  .  Recent laboratory studies: No results found.  Discharge Medications:     Medication List    TAKE these medications        acyclovir 400 MG tablet  Commonly known as:  ZOVIRAX  Take 400 mg by mouth 2 (two) times daily.     Alpha-Lipoic Acid 600 MG Caps  Take 600 mg by mouth 2 (two) times daily.     arginine 500 MG tablet  Take 500 mg by mouth daily.     aspirin EC 325 MG tablet  Take 325 mg by mouth at bedtime.     carvedilol 3.125 MG tablet  Commonly known as:  COREG  Take 1 tablet  (3.125 mg total) by mouth 2 (two) times daily with a meal.     cetirizine 10 MG tablet  Commonly known as:  ZYRTEC  Take 10 mg by mouth at bedtime.     docusate sodium 100 MG capsule  Commonly known as:  COLACE  Take 1 capsule (100 mg total) by mouth 2 (two) times daily.     docusate sodium 100 MG capsule  Commonly known as:  COLACE  Take 1 capsule (100 mg total) by mouth 2 (two) times daily.     HYDROmorphone 2 MG tablet  Commonly known as:  DILAUDID  Take 1 tablet (2 mg total) by mouth every 4 (four) hours as needed for moderate pain or severe pain.     insulin pump Soln  Inject into the skin continuous. Novolog insulin     isosorbide mononitrate 120 MG 24 hr tablet  Commonly known as:  IMDUR  Take 120 mg by mouth at bedtime.     levothyroxine 25 MCG tablet  Commonly known as:  SYNTHROID, LEVOTHROID  Take 25 mcg by mouth daily before breakfast.     Linoleic Acid Conjugated 1000 MG Caps  Take 1,000 mg by mouth daily.     Melatonin 500 MCG Tbdp  Take 500 mcg by mouth at bedtime as needed (sleep).     methocarbamol 500 MG tablet  Commonly known as:  ROBAXIN  Take 1 tablet (500 mg total) by mouth 4 (four) times daily.     NEXIUM PO  Take 20 mg by mouth every morning.     nitroGLYCERIN 0.4 MG/SPRAY spray  Commonly known as:  NITROLINGUAL  Place 1 spray under the tongue every 5 (five) minutes x 3 doses as needed for chest pain.     NOVOLOG 100 UNIT/ML injection  Generic drug:  insulin aspart  Inject 0-25 Units into the skin daily. In insulin pump     OIL OF OREGANO PO  Take 1 capsule by mouth 2 (two) times daily.     ondansetron 4 MG tablet  Commonly known as:  ZOFRAN  Take 1 tablet (4 mg total) by mouth every 8 (eight) hours as needed for nausea or vomiting.     OVER THE COUNTER MEDICATION  Take 2.9 g by mouth at bedtime. SeroVital  HGH     OVER THE COUNTER MEDICATION  Take 2 g by mouth daily. L-glutamine powder     OVER THE COUNTER MEDICATION  Take 750 mg  by mouth at bedtime. GABA     oxyCODONE 5 MG immediate release tablet  Commonly known as:  Oxy IR/ROXICODONE  Take 5 mg by mouth every 4 (four) hours as needed for severe pain.     potassium chloride 10 MEQ tablet  Commonly known as:  K-DUR,KLOR-CON  Take 1 tablet (10 mEq total) by mouth 2 (two) times daily.     Thyroid 16.25 MG Tabs  Take 1 tablet by mouth daily before breakfast.     vitamin C 500 MG tablet  Commonly known as:  ASCORBIC ACID  Take 1 tablet (500 mg total) by mouth daily.     Vitamin D3 5000 units Tabs  Take 5,000 Units by mouth daily.     VITAMIN DEFICIENCY SYSTEM-B12 1000 MCG/ML Kit  Generic drug:  Cyanocobalamin  Inject 1,000 mcg as directed every 3 (three) days.        Diagnostic Studies: No results found.  They benefited maximally from their hospital stay and there were no complications.     Disposition: 01-Home or Self Care      Follow-up Information    Follow up with Linna Hoff, MD In 2 weeks.   Specialty:  Orthopedic Surgery   Contact information:   [address] [place] [address] [phone]        Signed: Linna Hoff 05/07/2015, 1:08 PM

## 2015-05-07 NOTE — Progress Notes (Signed)
Pt is very anxious to be discharged this morning. There was no orders for PT/OT to see patient. Patient and husband stated that they did not need any PT/OT. This RN called MD office X2 to make sure that he did not want pt to have PT/OT ordered and that the med reconciliation was not completed. I informed the pt and family that I was unable to print off discharge packet due to med reconciliation not complet. Pt and family was insistent that they wanted to leave now. There is a discharge order dated for 05/07/15 and prescriptions were given to patient. Pt to be discharged home with husband today.

## 2015-05-07 NOTE — Op Note (Signed)
NAMEYAQUELIN, REICKS            ACCOUNT NO.:  [id number]  MEDICAL RECORD NO.:  [id number]  LOCATION:  5N30C                        FACILITY:  [facility]  PHYSICIAN:  Linna Hoff IV, M.D.DATE OF BIRTH:  Aug 29, 1955  DATE OF PROCEDURE:  05/06/2015 DATE OF DISCHARGE:                              OPERATIVE REPORT   PREOPERATIVE DIAGNOSIS:  Right wrist intra-articular distal radius fracture, two more fragments.  POSTOPERATIVE DIAGNOSIS:  Right wrist intra-articular distal radius fracture, two more fragments.  ATTENDING SURGEON:  Linna Hoff, M.D., who scrubbed and present for the entire procedure.  ASSISTANT SURGEON:  None.  ANESTHESIA:  Supraclavicular block with IV sedation.  PROCEDURE: 1. Open treatment of right wrist intra-articular distal radius     fracture, 2 more fragments. 2. Radiographs 3 views, right wrist. 3. Brachioradialis tendon release and tendon tenotomy.  SURGICAL IMPLANTS:  DVR Crosslock and narrow plate with 5 screws proximally and 6 distal locking pegs.  SURGICAL INDICATIONS:  Ms. Neighbours is a right-hand-dominant female, who sustained a closed injury to her right distal radius.  The patient has known osteoporosis.  It was recommended that she undergo the above procedure.  Risks, benefits, and alternatives were discussed in detail with the patient.  Signed informed consent was obtained.  Risks include, but not limited to bleeding; infection; damage to nearby nerves, arteries, or tendons; loss motion of wrist and digits; incomplete relief of symptoms; nonunion; malunion; hardware failure; and the need for further surgical intervention.  DESCRIPTION OF PROCEDURE:  The patient was properly identified in the preoperative holding area, marked with a permanent marker made on the right wrist to indicate correct operative site.  The patient was brought back to the operating room, placed supine on the anesthesia room table, where the IV sedation was  administered.  The patient had undergone the block well.  A well-padded tourniquet was placed on the right brachium and sealed with 1000 drape.  The right upper extremity was then prepped and draped in normal sterile fashion.  Time-out was called, correct site was identified, and procedure then begun.  Attention then turned to the right wrist.  The limb was then elevated and tourniquet insufflated.  A longitudinal incision was made directly over the FCR sheath.  Dissection was carried down through the skin and subcutaneous tissue.  The FCR sheath was then opened proximally and distally.  The FPL was then swept out of the way, and the sweep of the pronator quadratus flap was then carried out.  The fracture site was then exposed.  The patient had an intra-articular fracture extending into the joint with 2 more fragments.  An open reduction was then performed.  The brachioradialis then carefully released off the radial styloid in order to reduce the radial column.  Tendon tenotomy was then carried out. Following this, the volar plate was then applied.  It was held distally with a K-wire and position confirmed using mini C-arm.  After plate position was confirmed, the oblong screw hole was then used proximally. It was then fixed distally moving from an ulnar to radial direction with the distal locking pegs.  Final proximal fixation was then carried out.  Final radiographs were then obtained.  The  wound was then thoroughly irrigated.  Stress examination did not show any segmented distal radioulnar joint or widening of the SL interval.  The pronator quadratus was then closed with 2-0 Vicryl.  Subcutaneous tissues closed with 4-0 Vicryl.  Skin closed with 4-0 Vicryl Rapide.  Betadose dressing, sterile compressive bandage then applied.  The patient was placed in a well- padded sugar-tong splint and taken to recovery room in good condition.  Intraoperative radiographs, AP and lateral views of  the wrist and oblique views did show volar plate fixation plate with good alignment on both planes.  POSTPROCEDURE PLAN:  The patient will be admitted overnight for IV antibiotics, pain control, discharged in the morning, seen back in the office in approximately 2 weeks for wound check, suture removal, application of short-arm cast at a 4-week mark and begin therapy regimen at 4-week mark.  Radiographs at each visit.     Melrose Nakayama, M.D.     FWO/MEDQ  D:  05/06/2015  T:  05/06/2015  Job:  [id number]

## 2015-05-07 NOTE — Progress Notes (Signed)
Inpatient Diabetes Program Recommendations  AACE/ADA: New Consensus Statement on Inpatient Glycemic Control (2015)  Target Ranges:  Prepandial:   less than 140 mg/dL      Peak postprandial:   less than 180 mg/dL (1-2 hours)      Critically ill patients:  140 - 180 mg/dL   Review of Glycemic Control  Diabetes history: DM 1 Outpatient Diabetes medications: Novolog Insulin Pump Current orders for Inpatient glycemic control: Insulin Pump  Inpatient Diabetes Program Recommendations:   RN: Please chart on insulin pump flow sheet Qshift. Make sure insulin pump contract is signed and placed at bedside. Chart insulin boluses with each meal in the MAR. Coincide glucose checks with our meter and their meter.  Thanks,  Tama Headings RN, MSN, [facility] Inpatient Diabetes Coordinator Team Pager [phone] (8a-5p)

## 2015-05-07 NOTE — Care Management Obs Status (Signed)
[place] NOTIFICATION   Patient Details  Name: Brittney Pace MRN: [id number] Date of Birth: Jul 10, 1955   Medicare Observation Status Notification Given:  Yes    Nila Nephew, RN 05/07/2015, 9:54 AM

## 2015-05-09 NOTE — Anesthesia Procedure Notes (Signed)
Anesthesia Regional Block:  Supraclavicular block  Pre-Anesthetic Checklist: ,, timeout performed, Correct Patient, Correct Site, Correct Laterality, Correct Procedure, Correct Position, site marked, Risks and benefits discussed,  Surgical consent,  Pre-op evaluation,  At surgeon's request and post-op pain management  Laterality: Right  Prep: chloraprep       Needles:  Injection technique: Single-shot  Needle Type: Echogenic Stimulator Needle     Needle Length: 9cm 9 cm Needle Gauge: 21 and 21 G    Additional Needles:  Procedures: ultrasound guided (picture in chart) Supraclavicular block Narrative:  Start time: 05/06/2015 5:45 PM End time: 05/06/2015 5:50 PM Injection made incrementally with aspirations every 5 mL.  Performed by: Personally   Additional Notes: 20 cc 0.5% Bupivacaine with 1:200 epi injected easily

## 2015-05-09 NOTE — Anesthesia Postprocedure Evaluation (Signed)
Anesthesia Post Note  Patient: Chastity Kadar  Procedure(s) Performed: Procedure(s) (LRB): OPEN REDUCTION INTERNAL FIXATION (ORIF) RIGHT DISTAL RADIAL FRACTURE AND REPAIRS AS NEEDED (Right)  Patient location during evaluation: PACU Anesthesia Type: MAC and Regional Level of consciousness: awake, awake and alert and oriented Pain management: pain level controlled Vital Signs Assessment: post-procedure vital signs reviewed and stable Respiratory status: spontaneous breathing, nonlabored ventilation, respiratory function stable and patient connected to nasal cannula oxygen Cardiovascular status: blood pressure returned to baseline Anesthetic complications: no    Last Vitals:  Filed Vitals:   05/07/15 0300 05/07/15 1237  BP: 135/52 142/69  Pulse: 83   Temp: 37.7 C 36.4 C  Resp: 18     Last Pain:  Filed Vitals:   05/07/15 1238  PainSc: 9                  Nikolai Wilczak COKER

## 2015-05-21 DIAGNOSIS — H43393 Other vitreous opacities, bilateral: Secondary | ICD-10-CM | POA: Insufficient documentation

## 2015-05-21 DIAGNOSIS — H524 Presbyopia: Secondary | ICD-10-CM | POA: Insufficient documentation

## 2015-05-21 DIAGNOSIS — H52203 Unspecified astigmatism, bilateral: Secondary | ICD-10-CM | POA: Insufficient documentation

## 2015-05-21 DIAGNOSIS — H5213 Myopia, bilateral: Secondary | ICD-10-CM | POA: Insufficient documentation

## 2015-05-21 DIAGNOSIS — H25812 Combined forms of age-related cataract, left eye: Secondary | ICD-10-CM | POA: Insufficient documentation

## 2015-05-21 DIAGNOSIS — H18529 Epithelial (juvenile) corneal dystrophy, unspecified eye: Secondary | ICD-10-CM | POA: Insufficient documentation

## 2015-05-21 DIAGNOSIS — Z961 Presence of intraocular lens: Secondary | ICD-10-CM | POA: Insufficient documentation

## 2015-06-16 DIAGNOSIS — S92061A Displaced intraarticular fracture of right calcaneus, initial encounter for closed fracture: Secondary | ICD-10-CM | POA: Insufficient documentation

## 2015-06-29 ENCOUNTER — Telehealth: Payer: Self-pay | Admitting: Cardiology

## 2015-06-29 DIAGNOSIS — E785 Hyperlipidemia, unspecified: Secondary | ICD-10-CM

## 2015-06-29 DIAGNOSIS — Z13220 Encounter for screening for lipoid disorders: Secondary | ICD-10-CM

## 2015-06-29 NOTE — Telephone Encounter (Signed)
New message    The pt needs the lipid panel order put in for the pt the [facility] location, the pt was told to go there tomorrow but no order has been sent in.

## 2015-06-29 NOTE — Telephone Encounter (Signed)
Returned call and reordered expired labs to [facility] at patient request. Pt voiced acknowledgment and thanks. Aware to fast for test.

## 2015-07-01 LAB — LIPID PANEL
Cholesterol: 172 mg/dL (ref 125–200)
HDL: 73 mg/dL (ref >=46)
LDL Cholesterol: 82 mg/dL (ref <130)
Total CHOL/HDL Ratio: 2.4 Ratio (ref <=5.0)
Triglycerides: 84 mg/dL (ref <150)
VLDL: 17 mg/dL (ref <30)

## 2015-07-13 ENCOUNTER — Encounter: Payer: Self-pay | Admitting: Cardiology

## 2015-07-13 NOTE — Telephone Encounter (Signed)
New Message  Pt called for the results of her recent labs. Request a call back to discuss.

## 2015-07-13 NOTE — Telephone Encounter (Signed)
This encounter was created in error - please disregard.

## 2015-08-06 NOTE — Progress Notes (Signed)
HPI: FU CAD. Patient previously followed at [place]; H/O CABG (LIMA to LAD; SVG to RCA); According to the patient, she had 2 stents to the LIMA graft and the LAD in 2000. She's had EECP treatments in [place]. Her last heart catheterization was August 2015 at [facility] which revealed two-vessel coronary disease with a patent LIMA to the LAD and SVG to the RCA. No obstructive disease in left circumflex; EF 65. Last echocardiogram was August 2015 and she had normal LV function. Since last seen, Patient denies dyspnea, chest pain, palpitations or syncope.  Current Outpatient Prescriptions  Medication Sig Dispense Refill  . acyclovir (ZOVIRAX) 400 MG tablet Take 400 mg by mouth 2 (two) times daily.    . Alpha-Lipoic Acid 600 MG CAPS Take 600 mg by mouth 2 (two) times daily.     Marland Kitchen arginine 500 MG tablet Take 500 mg by mouth daily.    Marland Kitchen aspirin EC 325 MG tablet Take 325 mg by mouth at bedtime.    . carvedilol (COREG) 3.125 MG tablet Take 1 tablet (3.125 mg total) by mouth 2 (two) times daily with a meal. 180 tablet 1  . cetirizine (ZYRTEC) 10 MG tablet Take 10 mg by mouth at bedtime.    . Cholecalciferol (VITAMIN D3) 5000 UNITS TABS Take 5,000 Units by mouth daily.     . Cyanocobalamin (VITAMIN DEFICIENCY SYSTEM-B12) 1000 MCG/ML KIT Inject 1,000 mcg as directed every 3 (three) days.    . Esomeprazole Magnesium (NEXIUM PO) Take 20 mg by mouth every morning.     . Insulin Human (INSULIN PUMP) SOLN Inject into the skin continuous. Novolog insulin    . isosorbide mononitrate (IMDUR) 120 MG 24 hr tablet Take 120 mg by mouth at bedtime.    Marland Kitchen levothyroxine (SYNTHROID, LEVOTHROID) 25 MCG tablet Take 25 mcg by mouth daily before breakfast.    . Linoleic Acid Conjugated 1000 MG CAPS Take 1,000 mg by mouth daily.     . Melatonin 500 MCG TBDP Take 500 mcg by mouth at bedtime as needed (sleep).     . nitroGLYCERIN (NITROLINGUAL) 0.4 MG/SPRAY spray Place 1 spray under the tongue  every 5 (five) minutes x 3 doses as needed for chest pain. 12 g 1  . NON FORMULARY Chloroella    . NOVOLOG 100 UNIT/ML injection Inject 0-25 Units into the skin daily. In insulin pump  6  . ondansetron (ZOFRAN) 4 MG tablet Take 1 tablet (4 mg total) by mouth every 8 (eight) hours as needed for nausea or vomiting. 20 tablet 0  . OVER THE COUNTER MEDICATION Take 2.9 g by mouth at bedtime. SeroVital HGH    . OVER THE COUNTER MEDICATION Take 750 mg by mouth at bedtime. GABA    . potassium chloride (K-DUR,KLOR-CON) 10 MEQ tablet Take 1 tablet (10 mEq total) by mouth 2 (two) times daily. 60 tablet 1  . vitamin C (ASCORBIC ACID) 500 MG tablet Take 1 tablet (500 mg total) by mouth daily. 50 tablet 0   No current facility-administered medications for this visit.     Past Medical History  Diagnosis Date  . Diabetes mellitus without complication (HCC)     Type 1  . Idiopathic edema   . IBS (irritable bowel syndrome)   . Diabetic retinopathy ([place])   . Anemia   . Chronic kidney disease     stage 3  . Lyme disease   . Babesiasis     secondary  due to lyme disease  . Hypothyroidism   . Peripheral neuropathy ([place])   . Mitral valve prolapse   . Osteoporosis   . GERD (gastroesophageal reflux disease)   . Gastroparesis   . Fibromyalgia   . 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 Coronary artery disease   . Peripheral vascular disease ([place])   . Depression   . Headache     migraines  . Arthritis   . Myocardial infarction ([place])     1 major in 1999 and 2 minor " small vessel disease."  . 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 Family history of adverse reaction to anesthesia     mother: " while she was under she stopped breathing."    Past Surgical History  Procedure Laterality Date  . Coronary artery bypass graft    . Cardiac catheterization    . Abdominal hysterectomy    . Coronary artery stents      at LAD and  LIMA  . Cataract extraction w/ intraocular lens implant      right eye  . Appendectomy    . Colonoscopy w/ biopsies and polypectomy    . Breast surgery      B/L biopsy and lumpectomy   . Ectopic pregnancy surgery    . Orif wrist fracture Left 11/05/2014  . Orif wrist fracture Left 11/05/2014    Procedure: OPEN REDUCTION INTERNAL FIXATION (ORIF) LEFT WRIST FRACTURE AND REPAIR AS INDICATED;  Surgeon: Iran Planas, MD;  Location: [place];  Service: Orthopedics;  Laterality: Left;  . Open reduction internal fixation (orif) distal radial fracture Right 05/06/2015    Procedure: OPEN REDUCTION INTERNAL FIXATION (ORIF) RIGHT DISTAL RADIAL FRACTURE AND REPAIRS AS NEEDED;  Surgeon: Iran Planas, MD;  Location: [facility];  Service: Orthopedics;  Laterality: Right;    Social History   Social History  . Marital Status: Married    Spouse Name: N/A  . Number of Children: N/A  . Years of Education: N/A   Occupational History  . Not on file.   Social History Main Topics  . Smoking status: Former Smoker    Types: Cigarettes  . Smokeless tobacco: Never Used     Comment:  " Quit smoking cigarettes in 20's "  . Alcohol Use: Yes     Comment: occasional beer or wine  . Drug Use: No  . Sexual Activity: Not on file   Other Topics Concern  . Not on file   Social History Narrative    Family History  Problem Relation Age of Onset  . Breast cancer Mother   . Heart disease Father   . Hypertension Father   . Breast cancer Sister     ROS: Residual ankle pain from recent fracture but no fevers or chills, productive cough, hemoptysis, dysphasia, odynophagia, melena, hematochezia, dysuria, hematuria, rash, seizure activity, orthopnea, PND, pedal edema, claudication. Remaining systems are negative.  Physical Exam: Well-developed well-nourished in no acute distress.  Skin is warm and dry.  HEENT is normal.  Neck is supple.  Chest is clear to auscultation with normal expansion.  Cardiovascular exam is  regular rate and rhythm.  Abdominal exam nontender or distended. No masses palpated. Extremities show no edema. neuro grossly intact  ECG Normal sinus, low voltage.   A/P  1 Coronary artery disease-continue aspirin. Intolerant to statins.  2 hyperlipidemia-intolerant to statins and  Zetia. Recent LDL 84. Continue diet.  3 diabetes mellitus-management per primary care.  Brian Crenshaw, MD     

## 2015-08-12 ENCOUNTER — Encounter: Payer: Self-pay | Admitting: Cardiology

## 2015-08-12 ENCOUNTER — Ambulatory Visit (INDEPENDENT_AMBULATORY_CARE_PROVIDER_SITE_OTHER): Payer: BLUE CROSS/BLUE SHIELD | Admitting: Cardiology

## 2015-08-12 VITALS — BP 122/69 | HR 65 | Ht 59.5 in | Wt 102.4 lb

## 2015-08-12 DIAGNOSIS — E785 Hyperlipidemia, unspecified: Secondary | ICD-10-CM | POA: Diagnosis not present

## 2015-08-12 DIAGNOSIS — I2510 Atherosclerotic heart disease of native coronary artery without angina pectoris: Secondary | ICD-10-CM

## 2015-08-12 NOTE — Patient Instructions (Signed)
Your physician wants you to follow-up in: [place] will receive a reminder letter in the mail two months in advance. If you don't receive a letter, please call our office to schedule the follow-up appointment.   If you need a refill on your cardiac medications before your next appointment, please call your pharmacy.   DR Clovis Fred[facility]on [facility]

## 2015-08-18 NOTE — Addendum Note (Signed)
Addended by: Cristopher Estimable on: 08/18/2015 04:19 PM   Modules accepted: Orders

## 2015-08-19 ENCOUNTER — Telehealth: Payer: Self-pay | Admitting: Cardiology

## 2015-08-19 NOTE — Telephone Encounter (Signed)
Would avoid if possible Brittney Pace

## 2015-08-19 NOTE — Telephone Encounter (Signed)
New message     Pt would like to know if she can take a new PBA med (nudexta). Please call.

## 2015-08-19 NOTE — Telephone Encounter (Signed)
Message routed to MD to advise

## 2015-08-20 NOTE — Telephone Encounter (Signed)
Spoke with pt, Aware of dr crenshaw's recommendations.  °

## 2015-09-04 DIAGNOSIS — R140 Abdominal distension (gaseous): Secondary | ICD-10-CM | POA: Insufficient documentation

## 2015-09-25 DIAGNOSIS — E785 Hyperlipidemia, unspecified: Secondary | ICD-10-CM | POA: Insufficient documentation

## 2015-10-24 ENCOUNTER — Other Ambulatory Visit: Payer: Self-pay | Admitting: Cardiology

## 2015-10-26 NOTE — Telephone Encounter (Signed)
Rx request sent to pharmacy.  

## 2015-11-19 DIAGNOSIS — K6389 Other specified diseases of intestine: Secondary | ICD-10-CM | POA: Insufficient documentation

## 2015-11-24 ENCOUNTER — Encounter: Payer: Self-pay | Admitting: Emergency Medicine

## 2015-11-24 ENCOUNTER — Emergency Department (INDEPENDENT_AMBULATORY_CARE_PROVIDER_SITE_OTHER)
Admission: EM | Admit: 2015-11-24 | Discharge: 2015-11-24 | Disposition: A | Discharge status: Home / Self Care | Payer: BLUE CROSS/BLUE SHIELD | Source: Home / Self Care | Attending: Family Medicine | Admitting: Family Medicine

## 2015-11-24 DIAGNOSIS — S0502XA Injury of conjunctiva and corneal abrasion without foreign body, left eye, initial encounter: Secondary | ICD-10-CM | POA: Diagnosis not present

## 2015-11-24 MED ORDER — POLYMYXIN B-TRIMETHOPRIM 10000-0.1 UNIT/ML-% OP SOLN: 1.0000 [drp] | OPHTHALMIC | 0 refills | Status: DC

## 2015-11-24 NOTE — ED Provider Notes (Signed)
CSN: [id number]     Arrival date & time 11/24/15  1320 History   First MD Initiated Contact with Patient 11/24/15 1341     Chief Complaint  Patient presents with  . Eye Pain   (Consider location/radiation/quality/duration/timing/severity/associated sxs/prior Treatment) HPI Brittney Pace is a 60 y.o. female presenting to UC with c/o sudden onset burning and itching sensation in her Left eye that started earlier this morning, gradually worsening. Her eye is also watery.  She believes she may have either gotten a cat hair or one of her own hairs in her eyes but she is not certain. She has had mild nasal congestion but no cough or sore throat. Denies change in her vision. No known injury. No contact with others with eye symptoms. Hx of bacterial conjunctivitis in the past but notes she had a lot of thick drainage and eye redness at that time.  She has not tried anything for her current symptoms.    Past Medical History:  Diagnosis Date  . Anemia   . Arthritis   . 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 Chronic kidney disease    stage 3  . Coronary artery disease   . Depression   . Diabetes mellitus without complication (HCC)    Type 1  . Diabetic retinopathy ([place])   . Family history of adverse reaction to anesthesia    mother: " while she was under she stopped breathing."  . Fibromyalgia   . Gastroparesis   . GERD (gastroesophageal reflux disease)   . Headache    migraines  . Hypothyroidism   . IBS (irritable bowel syndrome)   . Idiopathic edema   . Lyme disease   . Mitral valve prolapse   . Myocardial infarction    1 major in 1999 and 2 minor " small vessel disease."  . Osteoporosis   . Peripheral neuropathy ([place])   . Peripheral vascular disease ([place])   . 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  ABDOMINAL HYSTERECTOMY    . APPENDECTOMY    . BREAST SURGERY     B/L biopsy and lumpectomy   . CARDIAC CATHETERIZATION    . CATARACT EXTRACTION W/ INTRAOCULAR LENS IMPLANT     right eye  . COLONOSCOPY W/ BIOPSIES AND POLYPECTOMY    . CORONARY ARTERY BYPASS GRAFT    . coronary artery stents     at [facility] and [facility]  . ECTOPIC PREGNANCY SURGERY    . OPEN REDUCTION INTERNAL FIXATION (ORIF) DISTAL RADIAL FRACTURE Right 05/06/2015   Procedure: OPEN REDUCTION INTERNAL FIXATION (ORIF) RIGHT DISTAL RADIAL FRACTURE AND REPAIRS AS NEEDED;  Surgeon: Iran Planas, MD;  Location: [facility];  Service: Orthopedics;  Laterality: Right;  . ORIF WRIST FRACTURE Left 11/05/2014  . ORIF WRIST FRACTURE Left 11/05/2014   Procedure: OPEN REDUCTION INTERNAL FIXATION (ORIF) LEFT WRIST FRACTURE AND REPAIR AS INDICATED;  Surgeon: Iran Planas, MD;  Location: [facility];  Service: Orthopedics;  Laterality: Left;   Family History  Problem Relation Age of Onset  . Breast cancer Mother   . Heart disease Father   . Hypertension Father   . Breast cancer Sister    Social History  Substance Use Topics  . Smoking status: Former Smoker    Types: Cigarettes  .  Smokeless tobacco: Never Used     Comment:  " Quit smoking cigarettes in 20's "  . Alcohol use Yes     Comment: occasional beer or wine   OB History    No data available     Review of Systems  Constitutional: Negative for chills and fever.  HENT: Positive for congestion (minimal). Negative for rhinorrhea and sinus pressure.   Eyes: Positive for pain and discharge (watery). Negative for photophobia, redness, itching and visual disturbance.  Neurological: Negative for dizziness, light-headedness and headaches.    Allergies  Dilaudid [hydromorphone]; Morphine; Morphine and related; Penicillins; Tramadol; Amitriptyline; Codeine; Darvon [propoxyphene]; Other; Statins; Sulfamethoxazole; Acetaminophen; Advil [ibuprofen]; Gluten meal; Mold extract [trichophyton]; Sulfa  antibiotics; Sulfites; Ultracet [tramadol-acetaminophen]; and Wheat bran  Home Medications   Prior to Admission medications   Medication Sig Start Date End Date Taking? Authorizing Provider  carvedilol (COREG) 3.125 MG tablet TAKE ONE TABLET BY MOUTH TWICE DAILY WITH MEALS 10/26/15  Yes Lelon Perla, MD  cetirizine (ZYRTEC) 10 MG tablet Take 10 mg by mouth at bedtime.   Yes Historical Provider, MD  Cholecalciferol (VITAMIN D3) 5000 UNITS TABS Take 5,000 Units by mouth daily.    Yes Historical Provider, MD  clopidogrel (PLAVIX) 75 MG tablet Take 75 mg by mouth daily.   Yes Historical Provider, MD  Cyanocobalamin (VITAMIN DEFICIENCY SYSTEM-B12) 1000 MCG/ML KIT Inject 1,000 mcg as directed every 3 (three) days.   Yes Historical Provider, MD  Esomeprazole Magnesium (NEXIUM PO) Take 20 mg by mouth every morning.    Yes Historical Provider, MD  Insulin Human (INSULIN PUMP) SOLN Inject into the skin continuous. Novolog insulin   Yes Historical Provider, MD  isosorbide mononitrate (IMDUR) 120 MG 24 hr tablet Take 120 mg by mouth at bedtime.   Yes Historical Provider, MD  levothyroxine (SYNTHROID, LEVOTHROID) 25 MCG tablet Take 25 mcg by mouth daily before breakfast.   Yes Historical Provider, MD  [place] 500 MG CAPS Take by mouth.   Yes Historical Provider, MD  Multiple Vitamins-Iron (CHLORELLA PO) Take by mouth.   Yes Historical Provider, MD  NOVOLOG 100 UNIT/ML injection Inject 0-25 Units into the skin daily. In insulin pump 03/30/15  Yes Historical Provider, MD  [place] as directed. TMJ   Yes Historical Provider, MD  potassium chloride (K-DUR,KLOR-CON) 10 MEQ tablet Take 1 tablet (10 mEq total) by mouth 2 (two) times daily. 03/25/15  Yes Lelon Perla, MD  Resveratrol 50 MG CAPS Take by mouth.   Yes Historical Provider, MD  Spirulina 500 MG TABS Take by mouth.   Yes Historical Provider, MD  acyclovir (ZOVIRAX) 400 MG tablet Take 400 mg by mouth 2 (two) times daily.    Historical  Provider, MD  Alpha-Lipoic Acid 600 MG CAPS Take 600 mg by mouth 2 (two) times daily.     Historical Provider, MD  arginine 500 MG tablet Take 500 mg by mouth daily.    Historical Provider, MD  aspirin EC 325 MG tablet Take 325 mg by mouth at bedtime.    Historical Provider, MD  hydroxychloroquine (PLAQUENIL) 200 MG tablet Take by mouth daily.    Historical Provider, MD  Linoleic Acid Conjugated 1000 MG CAPS Take 1,000 mg by mouth daily.     Historical Provider, MD  Melatonin 500 MCG TBDP Take 500 mcg by mouth at bedtime as needed (sleep).     Historical Provider, MD  nitroGLYCERIN (NITROLINGUAL) 0.4 MG/SPRAY spray Place 1 spray under the tongue every  5 (five) minutes x 3 doses as needed for chest pain. 01/02/15   Lelon Perla, MD  NON FORMULARY Chloroella    Historical Provider, MD  ondansetron (ZOFRAN) 4 MG tablet Take 1 tablet (4 mg total) by mouth every 8 (eight) hours as needed for nausea or vomiting. 11/05/14   Iran Planas, MD  OVER THE COUNTER MEDICATION Take 2.9 g by mouth at bedtime. SeroVital [place]    Historical Provider, MD  OVER THE COUNTER MEDICATION Take 750 mg by mouth at bedtime. GABA    Historical Provider, MD  OVER THE COUNTER MEDICATION as directed. ZeoBind    Historical Provider, MD  OVER THE COUNTER MEDICATION as directed. trifortify gel    Historical Provider, MD  OVER THE COUNTER MEDICATION as directed. Magnesium gluconate    Historical Provider, MD  OVER THE COUNTER MEDICATION as directed. Omega EPA/DHA    Historical Provider, MD  OVER THE COUNTER MEDICATION as directed. Grape fruit seed extract    Historical Provider, MD  [place] as directed. Bone builder    Historical Provider, MD  OVER THE COUNTER MEDICATION as directed. Pathogen Nosode    Historical Provider, MD  torsemide ([name]) 20 MG tablet TAKE ONE TABLET BY MOUTH ONCE DAILY IN THE MORNING.  MAY  TAKE  AN  EXTRA  ONE-HALF  TABLET  AS  NEEDED 10/26/15   Lelon Perla, MD   trimethoprim-polymyxin b (POLYTRIM) ophthalmic solution Place 1 drop into the right eye every 4 (four) hours. For 5 days 11/24/15   Noland Fordyce, PA-C  vitamin C (ASCORBIC ACID) 500 MG tablet Take 1 tablet (500 mg total) by mouth daily. 05/06/15   Iran Planas, MD  VITAMIN K PO Take by mouth as directed.    Historical Provider, MD   Meds Ordered and Administered this Visit  Medications - No data to display  BP 138/78 (BP Location: Left Arm)   Pulse 62   Temp 97.8 F (36.6 C) (Oral)   Ht '4\' 11"'$  (1.499 m)   Wt 102 lb (46.3 kg)   SpO2 99%   BMI 20.60 kg/m  No data found.   Physical Exam  Constitutional: She is oriented to person, place, and time. She appears well-developed and well-nourished. No distress.  HENT:  Head: Normocephalic and atraumatic.  Right Ear: Tympanic membrane normal.  Left Ear: Tympanic membrane normal.  Nose: Nose normal.  Mouth/Throat: Uvula is midline, oropharynx is clear and moist and mucous membranes are normal.  Eyes: Conjunctivae, EOM and lids are normal. Pupils are equal, round, and reactive to light. Lids are everted and swept, no foreign bodies found. Right eye exhibits no chemosis, no discharge and no hordeolum. No foreign body present in the right eye. Left eye exhibits no chemosis, no discharge and no hordeolum. No foreign body present in the left eye. Left conjunctiva is not injected. Left conjunctiva has no hemorrhage.  Left eye: no erythema or discharge. Non-tender. Scant amount of fluorescein uptake to lateral aspect of corneal c/w corneal abrasion. No foreign bodies noted on exam.  Neck: Normal range of motion.  Cardiovascular: Normal rate.   Pulmonary/Chest: Effort normal.  Musculoskeletal: Normal range of motion.  Neurological: She is alert and oriented to person, place, and time.  Skin: Skin is warm and dry. She is not diaphoretic.  Psychiatric: She has a normal mood and affect. Her behavior is normal.  Nursing note and vitals  reviewed.   Urgent Care Course   Clinical Course  Procedures (including critical care time)  Labs Review Labs Reviewed - No data to display  Imaging Review No results found.   MDM   1. Abrasion of left cornea, initial encounter    Exam c/w corneal abrasion w/o evidence of ulceration or periorbital cellulitis.  Rx: polytrim Home care instructions provided. Encouraged f/u with eye doctor if not improving in 2-3 days, sooner if worsening.     Noland Fordyce, PA-C 11/24/15 [phone]

## 2015-11-24 NOTE — ED Triage Notes (Signed)
Left eye pain woke up this morning with burning and watery eye

## 2015-12-05 ENCOUNTER — Emergency Department (INDEPENDENT_AMBULATORY_CARE_PROVIDER_SITE_OTHER): Payer: BLUE CROSS/BLUE SHIELD

## 2015-12-05 ENCOUNTER — Encounter: Payer: Self-pay | Admitting: Emergency Medicine

## 2015-12-05 ENCOUNTER — Emergency Department (INDEPENDENT_AMBULATORY_CARE_PROVIDER_SITE_OTHER)
Admission: EM | Admit: 2015-12-05 | Discharge: 2015-12-05 | Disposition: A | Discharge status: Home / Self Care | Payer: BLUE CROSS/BLUE SHIELD | Source: Home / Self Care | Attending: Family Medicine | Admitting: Family Medicine

## 2015-12-05 DIAGNOSIS — S53401A Unspecified sprain of right elbow, initial encounter: Secondary | ICD-10-CM | POA: Diagnosis not present

## 2015-12-05 DIAGNOSIS — M25821 Other specified joint disorders, right elbow: Secondary | ICD-10-CM | POA: Diagnosis not present

## 2015-12-05 DIAGNOSIS — M7711 Lateral epicondylitis, right elbow: Secondary | ICD-10-CM

## 2015-12-05 NOTE — ED Triage Notes (Signed)
Pt c/o right arm pain, no injury, putting on her bra today and felt a pop.

## 2015-12-05 NOTE — ED Provider Notes (Signed)
[facility]    CSN: [id number] Arrival date & time: 12/05/15  1437     History   Chief Complaint Chief Complaint  Patient presents with  . Arm Pain    HPI Brittney Pace is a 60 y.o. female.   Patient states that she has had a sore right elbow for about two weeks.  Today while putting on her bra she felt a sudden popping sensation in her right elbow, now with persistent pain.   The history is provided by the patient.  Arm Pain  This is a new problem. Episode onset: 2 weeks ago. The problem occurs constantly. The problem has been rapidly worsening. Associated symptoms comments: none. Exacerbated by: flexing right elbow. Nothing relieves the symptoms. She has tried nothing for the symptoms.    Past Medical History:  Diagnosis Date  . Anemia   . Arthritis   . 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 Chronic kidney disease    stage 3  . Coronary artery disease   . Depression   . Diabetes mellitus without complication (HCC)    Type 1  . Diabetic retinopathy ([place])   . Family history of adverse reaction to anesthesia    mother: " while she was under she stopped breathing."  . Fibromyalgia   . Gastroparesis   . GERD (gastroesophageal reflux disease)   . Headache    migraines  . Hypothyroidism   . IBS (irritable bowel syndrome)   . Idiopathic edema   . Lyme disease   . Mitral valve prolapse   . Myocardial infarction    1 major in 1999 and 2 minor " small vessel disease."  . Osteoporosis   . Peripheral neuropathy ([place])   . Peripheral vascular disease ([place])   . 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 Surgery, elective 05/06/2015  . CAD (coronary artery disease) 11/19/2014  . Diabetes mellitus ([place]) 11/19/2014  . Distal radius fracture, left 11/05/2014    Past Surgical History:   Procedure Laterality Date  . ABDOMINAL HYSTERECTOMY    . APPENDECTOMY    . BREAST SURGERY     B/L biopsy and lumpectomy   . CARDIAC CATHETERIZATION    . CATARACT EXTRACTION W/ INTRAOCULAR LENS IMPLANT     right eye  . COLONOSCOPY W/ BIOPSIES AND POLYPECTOMY    . CORONARY ARTERY BYPASS GRAFT    . coronary artery stents     at [facility] and [facility]  . ECTOPIC PREGNANCY SURGERY    . OPEN REDUCTION INTERNAL FIXATION (ORIF) DISTAL RADIAL FRACTURE Right 05/06/2015   Procedure: OPEN REDUCTION INTERNAL FIXATION (ORIF) RIGHT DISTAL RADIAL FRACTURE AND REPAIRS AS NEEDED;  Surgeon: Iran Planas, MD;  Location: [facility];  Service: Orthopedics;  Laterality: Right;  . ORIF WRIST FRACTURE Left 11/05/2014  . ORIF WRIST FRACTURE Left 11/05/2014   Procedure: OPEN REDUCTION INTERNAL FIXATION (ORIF) LEFT WRIST FRACTURE AND REPAIR AS INDICATED;  Surgeon: Iran Planas, MD;  Location: [facility];  Service: Orthopedics;  Laterality: Left;    OB History    No data available       Home Medications    Prior to Admission medications   Medication Sig Start Date End Date Taking? Authorizing Provider  acyclovir (ZOVIRAX) 400 MG tablet  Take 400 mg by mouth 2 (two) times daily.    Historical Provider, MD  Alpha-Lipoic Acid 600 MG CAPS Take 600 mg by mouth 2 (two) times daily.     Historical Provider, MD  arginine 500 MG tablet Take 500 mg by mouth daily.    Historical Provider, MD  aspirin EC 325 MG tablet Take 325 mg by mouth at bedtime.    Historical Provider, MD  carvedilol (COREG) 3.125 MG tablet TAKE ONE TABLET BY MOUTH TWICE DAILY WITH MEALS 10/26/15   Lelon Perla, MD  cetirizine (ZYRTEC) 10 MG tablet Take 10 mg by mouth at bedtime.    Historical Provider, MD  Cholecalciferol (VITAMIN D3) 5000 UNITS TABS Take 5,000 Units by mouth daily.     Historical Provider, MD  clopidogrel (PLAVIX) 75 MG tablet Take 75 mg by mouth daily.    Historical Provider, MD  Cyanocobalamin (VITAMIN DEFICIENCY SYSTEM-B12) 1000 MCG/ML KIT  Inject 1,000 mcg as directed every 3 (three) days.    Historical Provider, MD  Esomeprazole Magnesium (NEXIUM PO) Take 20 mg by mouth every morning.     Historical Provider, MD  hydroxychloroquine (PLAQUENIL) 200 MG tablet Take by mouth daily.    Historical Provider, MD  Insulin Human (INSULIN PUMP) SOLN Inject into the skin continuous. Novolog insulin    Historical Provider, MD  isosorbide mononitrate (IMDUR) 120 MG 24 hr tablet Take 120 mg by mouth at bedtime.    Historical Provider, MD  levothyroxine (SYNTHROID, LEVOTHROID) 25 MCG tablet Take 25 mcg by mouth daily before breakfast.    Historical Provider, MD  Linoleic Acid Conjugated 1000 MG CAPS Take 1,000 mg by mouth daily.     Historical Provider, MD  Mac[place] 500 MG CAPS Take by mouth.    Historical Provider, MD  Melatonin 500 MCG TBDP Take 500 mcg by mouth at bedtime as needed (sleep).     Historical Provider, MD  Multiple Vitamins-Iron (CHLORELLA PO) Take by mouth.    Historical Provider, MD  nitroGLYCERIN (NITROLINGUAL) 0.4 MG/SPRAY spray Place 1 spray under the tongue every 5 (five) minutes x 3 doses as needed for chest pain. 01/02/15   Lelon Perla, MD  NON FORMULARY Chloroella    Historical Provider, MD  NOVOLOG 100 UNIT/ML injection Inject 0-25 Units into the skin daily. In insulin pump 03/30/15   Historical Provider, MD  ondansetron (ZOFRAN) 4 MG tablet Take 1 tablet (4 mg total) by mouth every 8 (eight) hours as needed for nausea or vomiting. 11/05/14   Iran Planas, MD  OVER THE COUNTER MEDICATION Take 2.9 g by mouth at bedtime. SeroVital [place]    Historical Provider, MD  OVER THE COUNTER MEDICATION Take 750 mg by mouth at bedtime. GABA    Historical Provider, MD  OVER THE COUNTER MEDICATION as directed. ZeoBind    Historical Provider, MD  OVER THE COUNTER MEDICATION as directed. trifortify gel    Historical Provider, MD  OVER THE COUNTER MEDICATION as directed. Magnesium gluconate    Historical Provider, MD  OVER THE COUNTER  MEDICATION as directed. Omega EPA/DHA    Historical Provider, MD  OVER THE COUNTER MEDICATION as directed. Grape fruit seed extract    Historical Provider, MD  [place] as directed. Bone builder    Historical Provider, MD  OVER THE COUNTER MEDICATION as directed. Pathogen Nosode    Historical Provider, MD  OVER THE COUNTER MEDICATION as directed. TMJ    Historical Provider, MD  potassium chloride (K-DUR,KLOR-CON) 10  MEQ tablet Take 1 tablet (10 mEq total) by mouth 2 (two) times daily. 03/25/15   Lelon Perla, MD  Resveratrol 50 MG CAPS Take by mouth.    Historical Provider, MD  Spirulina 500 MG TABS Take by mouth.    Historical Provider, MD  torsemide ([name]) 20 MG tablet TAKE ONE TABLET BY MOUTH ONCE DAILY IN THE MORNING.  MAY  TAKE  AN  EXTRA  ONE-HALF  TABLET  AS  NEEDED 10/26/15   Lelon Perla, MD  trimethoprim-polymyxin b (POLYTRIM) ophthalmic solution Place 1 drop into the right eye every 4 (four) hours. For 5 days 11/24/15   Noland Fordyce, PA-C  vitamin C (ASCORBIC ACID) 500 MG tablet Take 1 tablet (500 mg total) by mouth daily. 05/06/15   Iran Planas, MD  VITAMIN K PO Take by mouth as directed.    Historical Provider, MD    Family History Family History  Problem Relation Age of Onset  . Breast cancer Mother   . Heart disease Father   . Hypertension Father   . Breast cancer Sister     Social History Social History  Substance Use Topics  . Smoking status: Former Smoker    Types: Cigarettes  . Smokeless tobacco: Never Used     Comment:  " Quit smoking cigarettes in 20's "  . Alcohol use Yes     Comment: occasional beer or wine     Allergies   Dilaudid [hydromorphone]; Morphine; Morphine and related; Penicillins; Tramadol; Amitriptyline; Codeine; Darvon [propoxyphene]; Other; Statins; Sulfamethoxazole; Acetaminophen; Advil [ibuprofen]; Gluten meal; Mold extract [trichophyton]; Sulfa antibiotics; Sulfites; Ultracet [tramadol-acetaminophen]; and Wheat  bran   Review of Systems Review of Systems  All other systems reviewed and are negative.    Physical Exam Triage Vital Signs ED Triage Vitals  Enc Vitals Group     BP 12/05/15 1518 145/77     Pulse Rate 12/05/15 1518 64     Resp --      Temp 12/05/15 1518 97.9 F (36.6 C)     Temp Source 12/05/15 1518 Oral     SpO2 12/05/15 1518 99 %     Weight 12/05/15 1518 104 lb 8 oz (47.4 kg)     Height 12/05/15 1518 4' 11.75" (1.518 m)     Head Circumference --      Peak Flow --      Pain Score 12/05/15 1520 7     Pain Loc --      Pain Edu? --      Excl. in [place]? --    No data found.   Updated Vital Signs BP 145/77 (BP Location: Left Arm)   Pulse 64   Temp 97.9 F (36.6 C) (Oral)   Ht 4' 11.75" (1.518 m)   Wt 104 lb 8 oz (47.4 kg)   SpO2 99%   BMI 20.58 kg/m   Visual Acuity Right Eye Distance:   Left Eye Distance:   Bilateral Distance:    Right Eye Near:   Left Eye Near:    Bilateral Near:     Physical Exam  Constitutional: She appears well-developed and well-nourished. No distress.  HENT:  Head: Atraumatic.  Eyes: EOM are normal. Pupils are equal, round, and reactive to light.  Neck: Normal range of motion.  Cardiovascular: Normal heart sounds.   Pulmonary/Chest: Breath sounds normal.  Musculoskeletal: Normal range of motion.       Right elbow: She exhibits swelling. Tenderness found. Lateral epicondyle tenderness noted.  Arms: There is distinct tenderness over the right lateral epicondyle.  Palpation there during resisted dorsiflexion and supination of the wrist recreates her pain.   No swelling or warmth.  Skin: Skin is warm and dry. No rash noted. There is erythema.  Nursing note and vitals reviewed.    UC Treatments / Results  Labs (all labs ordered are listed, but only abnormal results are displayed) Labs Reviewed - No data to display  EKG  EKG Interpretation None       Radiology Dg Elbow Complete Right  Result Date:  12/05/2015 CLINICAL DATA:  Pt states she felt a pop in her right elbow while getting dressed today. C/o posterior pain. Pain increased when trying to pick something up or when writing. Hx of osteoporosis. EXAM: RIGHT ELBOW - COMPLETE 3+ VIEW COMPARISON:  None. FINDINGS: No evidence of fracture of the ulna or humerus. The radial head is normal. No joint effusion. Vascular calcifications noted. IMPRESSION: No acute osseous abnormality. Vascular calcifications. Electronically Signed   By: Suzy Bouchard M.D.   On: 12/05/2015 16:07    Procedures Procedures (including critical care time)  Medications Ordered in UC Medications - No data to display   Initial Impression / Assessment and Plan / UC Course  I have reviewed the triage vital signs and the nursing notes.  Pertinent labs & imaging results that were available during my care of the patient were reviewed by me and considered in my medical decision making (see chart for details).  Clinical Course   Ace wrap applied. Apply ice pack for 20 minutes, 3 to 4 times daily  Continue until pain decreases.  Wear ace wrap until pain/swelling decrease.  Begin range of motion and stretching exercises as tolerated. Followup with Dr. Aundria Mems or Dr. Lynne Leader ([facility]) if not improving about two weeks.      Final Clinical Impressions(s) / UC Diagnoses   Final diagnoses:  Sprain of right elbow, initial encounter  Right lateral epicondylitis    New Prescriptions New Prescriptions   No medications on file     Kandra Nicolas, MD 12/08/15 2029

## 2015-12-05 NOTE — Discharge Instructions (Signed)
Apply ice pack for 20 minutes, 3 to 4 times daily  Continue until pain decreases.  Wear ace wrap until pain/swelling decrease.  Begin range of motion and stretching exercises as tolerated.

## 2015-12-16 ENCOUNTER — Ambulatory Visit (INDEPENDENT_AMBULATORY_CARE_PROVIDER_SITE_OTHER): Payer: BLUE CROSS/BLUE SHIELD | Admitting: Osteopathic Medicine

## 2015-12-16 ENCOUNTER — Ambulatory Visit (INDEPENDENT_AMBULATORY_CARE_PROVIDER_SITE_OTHER): Payer: BLUE CROSS/BLUE SHIELD | Admitting: Family Medicine

## 2015-12-16 ENCOUNTER — Encounter: Payer: Self-pay | Admitting: Osteopathic Medicine

## 2015-12-16 VITALS — BP 129/68 | HR 76 | Ht 59.0 in | Wt 100.0 lb

## 2015-12-16 DIAGNOSIS — M7711 Lateral epicondylitis, right elbow: Secondary | ICD-10-CM | POA: Insufficient documentation

## 2015-12-16 DIAGNOSIS — I2510 Atherosclerotic heart disease of native coronary artery without angina pectoris: Secondary | ICD-10-CM | POA: Diagnosis not present

## 2015-12-16 DIAGNOSIS — I2583 Coronary atherosclerosis due to lipid rich plaque: Secondary | ICD-10-CM

## 2015-12-16 DIAGNOSIS — E1059 Type 1 diabetes mellitus with other circulatory complications: Secondary | ICD-10-CM

## 2015-12-16 DIAGNOSIS — B6000 Babesiosis, unspecified: Secondary | ICD-10-CM | POA: Insufficient documentation

## 2015-12-16 DIAGNOSIS — I6350 Cerebral infarction due to unspecified occlusion or stenosis of unspecified cerebral artery: Secondary | ICD-10-CM | POA: Diagnosis not present

## 2015-12-16 DIAGNOSIS — Z8673 Personal history of transient ischemic attack (TIA), and cerebral infarction without residual deficits: Secondary | ICD-10-CM | POA: Diagnosis not present

## 2015-12-16 DIAGNOSIS — A6920 Lyme disease, unspecified: Secondary | ICD-10-CM | POA: Insufficient documentation

## 2015-12-16 DIAGNOSIS — K580 Irritable bowel syndrome with diarrhea: Secondary | ICD-10-CM

## 2015-12-16 DIAGNOSIS — M810 Age-related osteoporosis without current pathological fracture: Secondary | ICD-10-CM

## 2015-12-16 DIAGNOSIS — I5032 Chronic diastolic (congestive) heart failure: Secondary | ICD-10-CM

## 2015-12-16 DIAGNOSIS — B009 Herpesviral infection, unspecified: Secondary | ICD-10-CM

## 2015-12-16 MED ORDER — ACYCLOVIR 400 MG PO TABS: 400.0000 mg | ORAL_TABLET | Freq: Two times a day (BID) | ORAL | 3 refills | Status: DC

## 2015-12-16 MED ORDER — DICLOFENAC SODIUM 1 % TD GEL: 2.0000 g | Freq: Four times a day (QID) | TRANSDERMAL | 11 refills | Status: DC

## 2015-12-16 MED ORDER — POTASSIUM CHLORIDE CRYS ER 10 MEQ PO TBCR: 10.0000 meq | EXTENDED_RELEASE_TABLET | Freq: Two times a day (BID) | ORAL | 3 refills | Status: DC

## 2015-12-16 MED ORDER — ISOSORBIDE MONONITRATE ER 120 MG PO TB24: 120.0000 mg | ORAL_TABLET | Freq: Every day | ORAL | 3 refills | Status: DC

## 2015-12-16 MED ORDER — TORSEMIDE 20 MG PO TABS: ORAL_TABLET | ORAL | 3 refills | Status: DC

## 2015-12-16 NOTE — Progress Notes (Signed)
   Subjective:    I'm seeing this patient as a consultation for:  Dr Assunta Found,  CC: Brittney Reeve, DO   CC: Right elbow pain  HPI: Patient has a several week history of right elbow soreness resulting in a pop when lifting an object about 2 weeks ago. She was seen in urgent care where x-ray of her right elbow was unremarkable. She notes continued lateral elbow pain worse with wrist extension activities. No radiating pain weakness or numbness. She has tried ice which has helped a bit.  Past medical history, Surgical history, Family history not pertinant except as noted below, Social history, Allergies, and medications have been entered into the medical record, reviewed, and no changes needed.   Review of Systems: No headache, visual changes, nausea, vomiting, diarrhea, constipation, dizziness, abdominal pain, skin rash, fevers, chills, night sweats, weight loss, swollen lymph nodes, body aches, joint swelling, muscle aches, chest pain, shortness of breath, mood changes, visual or auditory hallucinations.   Objective:    Vitals:   12/16/15 1010  BP: 129/68  Pulse: 70   General: Well Developed, well nourished, and in no acute distress.  Neuro/Psych: Alert and oriented x3, extra-ocular muscles intact, able to move all 4 extremities, sensation grossly intact. Skin: Warm and dry, no rashes noted.  Respiratory: Not using accessory muscles, speaking in full sentences, trachea midline.  Cardiovascular: Pulses palpable, no extremity edema. Abdomen: Does not appear distended. MSK: Right elbow slight bump at the lateral epicondyle. Tender to palpation at the lateral epicondyle. Normal elbow motion. Pain with resisted wrist extension. Pulses capillary refill and sensation intact distally.  Limited musculoskeletal ultrasound of the right elbow shows slight tiny avulsion at the lateral epicondyle. No large common extensor tendon tears. Consistent with lateral epicondylitis. Otherwise  normal-appearing  Right elbow x-ray dated 12/05/2015 reviewed: Study Result   CLINICAL DATA:  Pt states she felt a pop in her right elbow while getting dressed today. C/o posterior pain. Pain increased when trying to pick something up or when writing. Hx of osteoporosis.  EXAM: RIGHT ELBOW - COMPLETE 3+ VIEW  COMPARISON:  None.  FINDINGS: No evidence of fracture of the ulna or humerus. The radial head is normal. No joint effusion. Vascular calcifications noted.  IMPRESSION: No acute osseous abnormality.  Vascular calcifications.   Electronically Signed   By: Suzy Bouchard M.D.   On: 12/05/2015 16:07      No results found for this or any previous visit (from the past 24 hour(s)). No results found.  Impression and Recommendations:    Assessment and Plan: 60 y.o. female with Lateral epicondylitis. Treat with home exercises consisting of eccentric wrist extension activities. Additionally his diclofenac gel. Recheck in about one month. Return sooner if needed.   Discussed warning signs or symptoms. Please see discharge instructions. Patient expresses understanding.

## 2015-12-16 NOTE — Progress Notes (Signed)
HPI: Brittney Pace is a 60 y.o. female  who presents to [facility] today, 12/16/15,  for chief complaint of:  Chief Complaint  Patient presents with  . Establish Care    Very pleasant new patient here to establish care. Multiple medical problems and under care of multiple specialists, most of which are complications from long-standing type 1 diabetes. See below for review of medical history and current medications/treatment by system.  CARDIOVASCULAR: following with Dr Stanford Breed MI: 1999, stent placement  History of diastolic congestive heart failure. Status post CABG. Few episodes pericarditis  Medication management includes: ASA Carvedilol Plavix - not taking this anymore - removed from list Isosorbide Nitro prn Torsemide Potassium  RESPIRATORY Seasonal Allergies - Cetirizine Had asthma and pericarditis after bypass surgery   NEUROLOGICAL: following previously Dr. 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 On high-dose aspirin.  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 Patient has been on multiple different types of osteoporosis treatments and requests referral to a specialist Hx Tendonitis  - release pulleys on fingers, some multiple. Hx DeQuervain's both sides, Frozen shoulders  HEENT Seasonal allergies    HEME: following with Dr. 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 Right lateral epicondylitis 12/16/2015  . 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 Lyme disease 12/16/2015  . Hyperlipidemia 09/25/2015  . Myopia of both eyes with astigmatism and presbyopia 05/21/2015  . Vitreous syneresis of both eyes 05/21/2015  . Combined form of age-related cataract, left eye 05/21/2015  . Surgery, elective 05/06/2015  . CAD (coronary artery disease) 11/19/2014  . Diabetes mellitus type I ([place]) 11/19/2014  . DDD (degenerative disc disease), cervical 03/11/2014  . Vitreous hemorrhage of left eye ([place]) 03/03/2014  . DDD (degenerative disc disease), thoracic 01/07/2014  . Anemia due to stage 3 chronic kidney disease 12/16/2013  . Osteoporosis 11/14/2013  . Chronic diastolic heart failure ([place]) 09/18/2013  . Cataract due to secondary diabetes ([place]) 08/26/2013  . 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 associated with type 2 diabetes mellitus ([place]) 08/26/2013  . Hypothyroidism due to acquired atrophy of thyroid 08/02/2013  . Cerebral artery occlusion with cerebral infarction ([place]) 07/14/2013  . History of diabetic gastroparesis 04/09/2013  . Irritable bowel syndrome with diarrhea 04/09/2013  . Anemia of chronic disease 04/08/2013  . 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 ([place]) 67/89/3810   Past Surgical History:  Procedure Laterality Date  . ABDOMINAL HYSTERECTOMY    . APPENDECTOMY    . BREAST SURGERY     B/L biopsy and lumpectomy   . CARDIAC CATHETERIZATION    . CATARACT EXTRACTION W/ INTRAOCULAR LENS IMPLANT     right eye  .  COLONOSCOPY W/ BIOPSIES AND POLYPECTOMY    . CORONARY ARTERY BYPASS GRAFT    . coronary artery stents     at [place] and [place]  . ECTOPIC  PREGNANCY SURGERY    . OPEN REDUCTION INTERNAL FIXATION (ORIF) DISTAL RADIAL FRACTURE Right 05/06/2015   Procedure: OPEN REDUCTION INTERNAL FIXATION (ORIF) RIGHT DISTAL RADIAL FRACTURE AND REPAIRS AS NEEDED;  Surgeon: Iran Planas, MD;  Location: [facility];  Service: Orthopedics;  Laterality: Right;  . ORIF WRIST FRACTURE Left 11/05/2014  . ORIF WRIST FRACTURE Left 11/05/2014   Procedure: OPEN REDUCTION INTERNAL FIXATION (ORIF) LEFT WRIST FRACTURE AND REPAIR AS INDICATED;  Surgeon: Iran Planas, MD;  Location: [facility];  Service: Orthopedics;  Laterality: Left;   Social History  Substance Use Topics  . Smoking status: Former Smoker    Types: Cigarettes  . Smokeless tobacco: Never Used     Comment:  " Quit smoking cigarettes in 20's "  . Alcohol use Yes     Comment: occasional beer or wine   Family History  Problem Relation Age of Onset  . Breast cancer Mother   . Heart disease Father   . Hypertension Father   . Breast cancer Sister      Current medication list and allergy/intolerance information reviewed:   Current Outpatient Prescriptions  Medication Sig Dispense Refill  . acyclovir (ZOVIRAX) 400 MG tablet Take 1 tablet (400 mg total) by mouth 2 (two) times daily. 180 tablet 3  . Alpha-Lipoic Acid 600 MG CAPS Take 600 mg by mouth 2 (two) times daily.     Marland Kitchen arginine 500 MG tablet Take 500 mg by mouth daily.    Marland Kitchen aspirin EC 325 MG tablet Take 325 mg by mouth at bedtime.    . carvedilol (COREG) 3.125 MG tablet TAKE ONE TABLET BY MOUTH TWICE DAILY WITH MEALS 180 tablet 3  . cetirizine (ZYRTEC) 10 MG tablet Take 10 mg by mouth at bedtime.    . Cholecalciferol (VITAMIN D3) 5000 UNITS TABS Take 1,000 Units by mouth daily.     . Cyanocobalamin (VITAMIN DEFICIENCY SYSTEM-B12) 1000 MCG/ML KIT Inject 1,000 mcg as directed every 3 (three) days.    . diclofenac sodium (VOLTAREN) 1 % GEL Apply 2 g topically 4 (four) times daily. To affected joint. 100 g 11  . Esomeprazole Magnesium (NEXIUM PO) Take 20  mg by mouth every morning.     . hydroxychloroquine (PLAQUENIL) 200 MG tablet Take by mouth daily.    . Insulin Human (INSULIN PUMP) SOLN Inject into the skin continuous. Novolog insulin    . isosorbide mononitrate (IMDUR) 120 MG 24 hr tablet Take 1 tablet (120 mg total) by mouth at bedtime. 90 tablet 3  . levothyroxine (SYNTHROID, LEVOTHROID) 25 MCG tablet Take 25 mcg by mouth daily before breakfast.    . Linoleic Acid Conjugated 1000 MG CAPS Take 1,000 mg by mouth daily.     . Maca 500 MG CAPS Take by mouth.    . Melatonin 500 MCG TBDP Take 500 mcg by mouth at bedtime as needed (sleep).     . Methylsulfonylmethane (MSM) 1000 MG TABS Take by mouth. 1-4 times a day    . Multiple Vitamins-Iron (CHLORELLA PO) Take by mouth.    . nitroGLYCERIN (NITROLINGUAL) 0.4 MG/SPRAY spray Place 1 spray under the tongue every 5 (five) minutes x 3 doses as needed for chest pain. 12 g 1  . NON FORMULARY Chloroella    . NOVOLOG 100 UNIT/ML injection Inject 0-25 Units into  the skin daily. In insulin pump  6  . OVER THE COUNTER MEDICATION Take 2.9 g by mouth at bedtime. SeroVital HGH    . OVER THE COUNTER MEDICATION Take 750 mg by mouth at bedtime. GABA    . OVER THE COUNTER MEDICATION as directed. ZeoBind    . OVER THE COUNTER MEDICATION as directed. trifortify gel    . OVER THE COUNTER MEDICATION as directed. Magnesium gluconate    . OVER THE COUNTER MEDICATION as directed. Omega EPA/DHA    . OVER THE COUNTER MEDICATION as directed. Grape fruit seed extract    . OVER THE COUNTER MEDICATION as directed. Bone builder    . OVER THE COUNTER MEDICATION as directed. Pathogen Nosode    . OVER THE COUNTER MEDICATION as directed. TMJ    . potassium chloride (K-DUR,KLOR-CON) 10 MEQ tablet Take 1 tablet (10 mEq total) by mouth 2 (two) times daily. 180 tablet 3  . Resveratrol 50 MG CAPS Take by mouth.    . Spirulina 500 MG TABS Take by mouth.    . torsemide ([name]) 20 MG tablet TAKE ONE TABLET BY MOUTH ONCE DAILY IN  THE MORNING.  MAY  TAKE  AN  EXTRA  ONE-HALF  TABLET  AS  NEEDED 135 tablet 3  . vitamin C (ASCORBIC ACID) 500 MG tablet Take 1 tablet (500 mg total) by mouth daily. 50 tablet 0  . VITAMIN K PO Take by mouth as directed.    . clopidogrel (PLAVIX) 75 MG tablet Take 75 mg by mouth daily.    . ondansetron (ZOFRAN) 4 MG tablet Take 1 tablet (4 mg total) by mouth every 8 (eight) hours as needed for nausea or vomiting. (Patient not taking: Reported on 12/16/2015) 20 tablet 0   No current facility-administered medications for this visit.    Allergies  Allergen Reactions  . Dilaudid [Hydromorphone] Other (See Comments)    Because of continuous glucose monitor  . Morphine Anaphylaxis and Shortness Of Breath  . Morphine And Related Shortness Of Breath  . Penicillins Hives    Has patient had a PCN reaction causing immediate rash, facial/tongue/throat swelling, SOB or lightheadedness with hypotension: Yes Has patient had a PCN reaction causing severe rash involving mucus membranes or skin necrosis: No Has patient had a PCN reaction that required hospitalization No Has patient had a PCN reaction occurring within the last 10 years: No If all of the above answers are "NO", then may proceed with Cephalosporin use.  . Tramadol Anaphylaxis  . Amitriptyline Other (See Comments)    Severe headache/ out of body feeling  . Codeine Other (See Comments)    Severe headaches/ out of body feeling  . Darvon [Propoxyphene] Other (See Comments)    Severe headache Severe headache Severe headaches / out of body feeling  . Other Swelling and Other (See Comments)    Cats itching Mold sinuses Cats itching Mold sinuses Cat hair and scratches cause swelling  . Statins Other (See Comments)    Muscle pains  . Sulfamethoxazole Other (See Comments)  . Acetaminophen Other (See Comments)    Alters insulin pump readings Alters insulin pump readings  . Advil [Ibuprofen] Other (See Comments)    Messes up CGM reading on  glucose monitor  . Gluten Meal Diarrhea and Nausea And Vomiting    Cramping Cramping  . Mold Extract [Trichophyton] Other (See Comments)    sinusitis  . Sulfa Antibiotics Other (See Comments)    Mouth ulcers  . Sulfites Other (See  Comments)    Mouth ulcers  . Ultracet [Tramadol-Acetaminophen] Other (See Comments)    Small vessel heart attack  . Wheat Bran Diarrhea and Nausea And Vomiting    Cramping Cramping      Review of Systems:  Constitutional:  No  fever, no chills, No recent illness, No unintentional weight changes. No significant fatigue.   HEENT: No  headache, no vision change, no hearing change, No sore throat, No  sinus pressure  Cardiac: No  chest pain, No  pressure, No palpitations, No  Orthopnea  Respiratory:  No  shortness of breath. No  Cough  Gastrointestinal: No  abdominal pain, No  nausea, No  vomiting,  No  blood in stool, No  diarrhea, No  constipation   Musculoskeletal: No new myalgia/arthralgia  Genitourinary: No  incontinence, No  abnormal genital bleeding, No abnormal genital discharge  Skin: No  Rash, No other wounds/concerning lesions  Hem/Onc: No  easy bruising/bleeding, No  abnormal lymph node  Endocrine: No cold intolerance,  No heat intolerance. No polyuria/polydipsia/polyphagia   Neurologic: No  weakness, No  dizziness, No  slurred speech/focal weakness/facial droop  Psychiatric: No  concerns with depression, No  concerns with anxiety, No sleep problems, No mood problems  Exam:  BP 129/68   Pulse 76   Ht 4' 11" (1.499 m)   Wt 100 lb (45.4 kg)   BMI 20.20 kg/m   Constitutional: VS see above. General Appearance: alert, well-developed, well-nourished, NAD  Eyes: Normal lids and conjunctive, non-icteric sclera  Ears, Nose, Mouth, Throat: MMM, Normal external inspection ears/nares/mouth/lips/gums. TM normal bilaterally. Pharynx/tonsils no erythema, no exudate. Nasal mucosa normal.   Neck: No masses, trachea midline. No thyroid  enlargement. No tenderness/mass appreciated. No lymphadenopathy  Respiratory: Normal respiratory effort. no wheeze, no rhonchi, no rales  Cardiovascular: S1/S2 normal, no murmur, no rub/gallop auscultated. RRR. No lower extremity edema. Pedal pulse II/IV bilaterally DP and PT. No carotid bruit or JVD. No abdominal aortic bruit.  Gastrointestinal: Nontender, no masses. No hepatomegaly, no splenomegaly. No hernia appreciated. Bowel sounds normal. Rectal exam deferred.   Musculoskeletal: Gait normal. No clubbing/cyanosis of digits.   Neurological: Normal balance/coordination. No tremor. No cranial nerve deficit on limited exam. Motor and sensation intact and symmetric. Cerebellar reflexes intact.   Skin: warm, dry, intact. No rash/ulcer. No concerning nevi or subq nodules on limited exam.    Psychiatric: Normal judgment/insight. Normal mood and affect. Oriented x3.      ASSESSMENT/PLAN: Multiple medications, multiple medical problems. Correlating care among specialists is going to be a challenge for this patient, but she is very motivated to care for herself, compliant with medication regimen, compliant with appointments. Refills and referrals ordered as below. Medication list updated, patient advised to be sure to review medication list at every visit and make sure to alert [place] of any changes, have specialists contact us if they change medication list.  History of lacunar cerebrovascular accident (CVA) - Plan: Ambulatory referral to Neurology  Coronary artery disease due to lipid rich plaque - Plan: isosorbide mononitrate (IMDUR) 120 MG 24 hr tablet, potassium chloride (K-DUR,KLOR-CON) 10 MEQ tablet  Cerebral artery occlusion with cerebral infarction ([place])  Chronic diastolic heart failure ([place]) - Plan: potassium chloride (K-DUR,KLOR-CON) 10 MEQ tablet, torsemide ([name]) 20 MG tablet  Irritable bowel syndrome with diarrhea  Type 1 diabetes mellitus with other circulatory complication  ([place]) - Plan: Ambulatory referral to Ophthalmology  HSV infection - Plan: acyclovir (ZOVIRAX) 400 MG tablet  Osteoporosis without current pathological fracture,  unspecified osteoporosis type - Plan: Ambulatory referral to Rheumatology      Visit summary with medication list and pertinent instructions was printed for patient to review. All questions at time of visit were answered - patient instructed to contact office with any additional concerns. ER/RTC precautions were reviewed with the patient. Follow-up plan: No Follow-up on file.  Note: Total time spent 45 minutes, greater than 50% of the visit was spent face-to-face counseling and coordinating care for the following: The primary encounter diagnosis was History of lacunar cerebrovascular accident (CVA). Diagnoses of Coronary artery disease due to lipid rich plaque, Cerebral artery occlusion with cerebral infarction [facility]), Chronic diastolic heart failure ([place]), Irritable bowel syndrome with diarrhea, Type 1 diabetes mellitus with other circulatory complication ([place]), HSV infection, and Osteoporosis without current pathological fracture, unspecified osteoporosis type were also pertinent to this visit.Marland Kitchen

## 2015-12-16 NOTE — Patient Instructions (Signed)
Thank you for coming in today. Continue the exercises we discussed.  Return for recheck in 1 month.  Use the voltaren gel as needed.    Tennis Elbow Tennis elbow (lateral epicondylitis) is inflammation of the outer tendons of your forearm close to your elbow. Your tendons attach your muscles to your bones. The outer tendons of your forearm are used to extend your wrist, and they attach on the outside part of your elbow. Tennis elbow is often found in people who play tennis, but anyone may get the condition from repeatedly extending the wrist or turning the forearm. What are the causes? This condition is caused by repeatedly extending your wrist and using your hands. It can result from sports or work that requires repetitive forearm movements. Tennis elbow may also be caused by an injury. What increases the risk? You have a higher risk of developing tennis elbow if you play tennis or another racquet sport. You also have a higher risk if you frequently use your hands for work. This condition is also more likely to develop in:  Musicians.  Carpenters, painters, and plumbers.  Cooks.  Cashiers.  People who work in [employer].  Architect workers.  Butchers.  People who use computers. What are the signs or symptoms? Symptoms of this condition include:  Pain and tenderness in your forearm and the outer part of your elbow. You may only feel the pain when you use your arm, or you may feel it even when you are not using your arm.  A burning feeling that runs from your elbow through your arm.  Weak grip in your hands. How is this diagnosed? This condition may be diagnosed by medical history and physical exam. You may also have other tests, including:  X-rays.  MRI. How is this treated? Your health care provider will recommend lifestyle adjustments, such as resting and icing your arm. Treatment may also include:  Medicines for inflammation. This may include shots of cortisone if  your pain continues.  Physical therapy. This may include massage or exercises.  An elbow brace. Surgery may eventually be recommended if your pain does not go away with treatment. Follow these instructions at home: Activity  Rest your elbow and wrist as directed by your health care provider. Try to avoid any activities that caused the problem until your health care provider says that you can do them again.  If a physical therapist teaches you exercises, do all of them as directed.  If you lift an object, lift it with your palm facing upward. This lowers the stress on your elbow. Lifestyle  If your tennis elbow is caused by sports, check your equipment and make sure that:  You are using it correctly.  It is the best fit for you.  If your tennis elbow is caused by work, take breaks frequently, if you are able. Talk with your manager about how to best perform tasks in a way that is safe.  If your tennis elbow is caused by computer use, talk with your manager about any changes that can be made to your work environment. General instructions  If directed, apply ice to the painful area:  Put ice in a plastic bag.  Place a towel between your skin and the bag.  Leave the ice on for 20 minutes, 2-3 times per day.  Take medicines only as directed by your health care provider.  If you were given a brace, wear it as directed by your health care provider.  Keep  all follow-up visits as directed by your health care provider. This is important. Contact a health care provider if:  Your pain does not get better with treatment.  Your pain gets worse.  You have numbness or weakness in your forearm, hand, or fingers. This information is not intended to replace advice given to you by your health care provider. Make sure you discuss any questions you have with your health care provider. Document Released: 01/10/2005 Document Revised: 09/10/2015 Document Reviewed: 01/06/2014 Elsevier  Interactive Patient Education  2017 [employer].

## 2016-01-20 ENCOUNTER — Encounter: Payer: Self-pay | Admitting: Neurology

## 2016-01-20 ENCOUNTER — Ambulatory Visit (INDEPENDENT_AMBULATORY_CARE_PROVIDER_SITE_OTHER): Payer: BLUE CROSS/BLUE SHIELD | Admitting: Neurology

## 2016-01-20 VITALS — BP 115/69 | HR 63 | Resp 14 | Ht 59.0 in | Wt 103.0 lb

## 2016-01-20 DIAGNOSIS — F329 Major depressive disorder, single episode, unspecified: Secondary | ICD-10-CM

## 2016-01-20 DIAGNOSIS — D631 Anemia in chronic kidney disease: Secondary | ICD-10-CM

## 2016-01-20 DIAGNOSIS — N183 Chronic kidney disease, stage 3 unspecified: Secondary | ICD-10-CM

## 2016-01-20 DIAGNOSIS — I6381 Other cerebral infarction due to occlusion or stenosis of small artery: Secondary | ICD-10-CM | POA: Insufficient documentation

## 2016-01-20 DIAGNOSIS — R413 Other amnesia: Secondary | ICD-10-CM | POA: Insufficient documentation

## 2016-01-20 DIAGNOSIS — I2510 Atherosclerotic heart disease of native coronary artery without angina pectoris: Secondary | ICD-10-CM | POA: Diagnosis not present

## 2016-01-20 DIAGNOSIS — I2583 Coronary atherosclerosis due to lipid rich plaque: Secondary | ICD-10-CM

## 2016-01-20 DIAGNOSIS — I25810 Atherosclerosis of coronary artery bypass graft(s) without angina pectoris: Secondary | ICD-10-CM | POA: Insufficient documentation

## 2016-01-20 DIAGNOSIS — F32A Depression, unspecified: Secondary | ICD-10-CM | POA: Insufficient documentation

## 2016-01-20 DIAGNOSIS — R5383 Other fatigue: Secondary | ICD-10-CM | POA: Diagnosis not present

## 2016-01-20 DIAGNOSIS — I639 Cerebral infarction, unspecified: Secondary | ICD-10-CM | POA: Diagnosis not present

## 2016-01-20 NOTE — Progress Notes (Signed)
[facility]  PATIENT: Brittney Pace DOB: May 07, 1955  REFERRING DOCTOR OR PCP:  Emeterio Reeve, DO SOURCE: patient, notes from PCP office  _________________________________   HISTORICAL  CHIEF COMPLAINT:  Chief Complaint  Patient presents with  . New Patient (Initial Visit)    Rm 12. Patient has a history of 2 strokes.     HISTORY OF PRESENT ILLNESS:  I had the pleasure seeing you patient, Brittney Pace, at [facility] for neurologic consultation regarding her history of stroke.  She has a history of stroke x 2.   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 She had neck pain and she states she was told she ruptured 2 discs in her neck   She reports that she had severe headaches an balance was off.   These symptoms lasted several months and she went out of disability (actually ws ofr DM polyneuropathy).   In 2010, she was carrying groceries and she lost vision followed by going down to the floor and losing consciousness x several seconds.   She was in [place] at the time.   She had an MRI in 2010 and she states it showed a lacunar stroke in the right thalamus and a brainstem stroke.    Interestingly, a more recent MRI from 2015 at [facility] did not current on either lesion.   Currently, she reports symptoms including lightheadedness, dizziness, migraines, reduced memory.  She sleeps well at night and always feels sleepy. She snores some but does not wake up gasping for air.  She  had a PSG in 2000 which showed no OSA.  She feels depressed but did not like antidepressants and just takes SAM-E now.     She has IDDM, CAD (h/o MI and CABG), anemia, chronic renal insufficiency, Lyme disease by her report.   She states the Lyme disease was untreated and undiagnosed x 5 years and the specialist she sees in [place] says she will have it for years.     He has placed her on Plaquenil and  B12  I reviewed the MRI reports from 11/12/2013 (images not available). The MRI of the brain shows "No evidence of acute abnormality.No significant white matter disease. No evidence of acute ischemia.No mass effect, hemorrhage, or hydrocephalus.No abnormal enhancement to suggest neoplasm, abscess, or mass lesion.Grossly normal flow voids in the major intracranial arteries and dural sinuses."    MR angiogram shows narrowing of the left MCA (anterior superior division at its origin) and moderate narrowing of the left P2 P3 junction and subtle narrowing distally.   MR angiogram of the neck did not show significant stenosis.    Carotid arteries 11/12/2013 did not show significant stenosis by Doppler/US.    Cervical spine x-ray 03/11/2014 showed chronic bony fragment adjacent to the endplates of C5, trace retrolisthesis of C3 upon C4, grade 1 anterolisthesis of C4 upon C5 and moderate multilevel degenerative changes worse at C4-C5 and C5-C6.   MRI of the thoracic spine 11/24/2013 showed anterior height loss (less than 20%) of T6, T7-T8 and T9 with edema in the above these levels which was felt to favor Modic degenerative change more than change from fracture (still in differential)   partially, actual images were not available.     REVIEW OF SYSTEMS: Constitutional: No fevers, chills, sweats, or change in appetite.  Notes fatigue  Eyes: No visual changes, double vision, eye pain Ear, nose and throat: No hearing  loss, ear pain, nasal congestion, sore throat Cardiovascular: h/o MI, has CAD s/p CABG Respiratory: No shortness of breath at rest or with exertion.   No wheezes GastrointestinaI: No nausea, vomiting, diarrhea, abdominal pain, fecal incontinence Genitourinary: No dysuria, urinary retention or frequency.  No nocturia. Musculoskeletal: Notes neck pain and thoracic and lower back pain Integumentary: No rash, pruritus, skin lesions Neurological: as above Psychiatric: Notes mild depression at  this time.  No anxiety Endocrine: No palpitations, diaphoresis, change in appetite, change in weigh or increased thirst Hematologic/Lymphatic: No anemia, purpura, petechiae. Allergic/Immunologic: No itchy/runny eyes, nasal congestion, recent allergic reactions, rashes  ALLERGIES: Allergies  Allergen Reactions  . Dilaudid [Hydromorphone] Other (See Comments)    Because of continuous glucose monitor  . Morphine Anaphylaxis and Shortness Of Breath  . Morphine And Related Shortness Of Breath  . Penicillins Hives    Has patient had a PCN reaction causing immediate rash, facial/tongue/throat swelling, SOB or lightheadedness with hypotension: Yes Has patient had a PCN reaction causing severe rash involving mucus membranes or skin necrosis: No Has patient had a PCN reaction that required hospitalization No Has patient had a PCN reaction occurring within the last 10 years: No If all of the above answers are "NO", then may proceed with Cephalosporin use.  . Tramadol Anaphylaxis  . Amitriptyline Other (See Comments)    Severe headache/ out of body feeling  . Codeine Other (See Comments)    Severe headaches/ out of body feeling  . Darvon [Propoxyphene] Other (See Comments)    Severe headache Severe headache Severe headaches / out of body feeling  . Other Swelling and Other (See Comments)    Cats itching Mold sinuses Cats itching Mold sinuses Cat hair and scratches cause swelling  . Statins Other (See Comments)    Muscle pains  . Sulfamethoxazole Other (See Comments)  . Acetaminophen Other (See Comments)    Alters insulin pump readings Alters insulin pump readings  . Advil [Ibuprofen] Other (See Comments)    Messes up CGM reading on glucose monitor  . Gluten Meal Diarrhea and Nausea And Vomiting    Cramping Cramping  . Mold Extract [Trichophyton] Other (See Comments)    sinusitis  . Sulfa Antibiotics Other (See Comments)    Mouth ulcers  . Sulfites Other (See Comments)    Mouth  ulcers  . Ultracet [Tramadol-Acetaminophen] Other (See Comments)    Small vessel heart attack  . Wheat Bran Diarrhea and Nausea And Vomiting    Cramping Cramping    HOME MEDICATIONS:  Current Outpatient Prescriptions:  .  acyclovir (ZOVIRAX) 400 MG tablet, Take 1 tablet (400 mg total) by mouth 2 (two) times daily., Disp: 180 tablet, Rfl: 3 .  Alpha-Lipoic Acid 600 MG CAPS, Take 600 mg by mouth 2 (two) times daily. , Disp: , Rfl:  .  arginine 500 MG tablet, Take 500 mg by mouth daily., Disp: , Rfl:  .  aspirin EC 325 MG tablet, Take 325 mg by mouth at bedtime., Disp: , Rfl:  .  carvedilol (COREG) 3.125 MG tablet, TAKE ONE TABLET BY MOUTH TWICE DAILY WITH MEALS, Disp: 180 tablet, Rfl: 3 .  cetirizine (ZYRTEC) 10 MG tablet, Take 10 mg by mouth at bedtime., Disp: , Rfl:  .  Cholecalciferol (VITAMIN D3) 5000 UNITS TABS, Take 1,000 Units by mouth daily. , Disp: , Rfl:  .  Cyanocobalamin (VITAMIN DEFICIENCY SYSTEM-B12) 1000 MCG/ML KIT, Inject 1,000 mcg as directed every 3 (three) days., Disp: , Rfl:  .  diclofenac  sodium (VOLTAREN) 1 % GEL, Apply 2 g topically 4 (four) times daily. To affected joint., Disp: 100 g, Rfl: 11 .  Esomeprazole Magnesium (NEXIUM PO), Take 20 mg by mouth every morning. , Disp: , Rfl:  .  hydroxychloroquine (PLAQUENIL) 200 MG tablet, Take by mouth daily., Disp: , Rfl:  .  Insulin Human (INSULIN PUMP) SOLN, Inject into the skin continuous. Novolog insulin, Disp: , Rfl:  .  isosorbide mononitrate (IMDUR) 120 MG 24 hr tablet, Take 1 tablet (120 mg total) by mouth at bedtime., Disp: 90 tablet, Rfl: 3 .  levothyroxine (SYNTHROID, LEVOTHROID) 25 MCG tablet, Take 25 mcg by mouth daily before breakfast., Disp: , Rfl:  .  Linoleic Acid Conjugated 1000 MG CAPS, Take 1,000 mg by mouth daily. , Disp: , Rfl:  .  Melatonin 500 MCG TBDP, Take 500 mcg by mouth at bedtime as needed (sleep). , Disp: , Rfl:  .  Methylsulfonylmethane (MSM) 1000 MG TABS, Take by mouth. 1-4 times a day, Disp:  , Rfl:  .  nitroGLYCERIN (NITROLINGUAL) 0.4 MG/SPRAY spray, Place 1 spray under the tongue every 5 (five) minutes x 3 doses as needed for chest pain., Disp: 12 g, Rfl: 1 .  NON FORMULARY, Chloroella, Disp: , Rfl:  .  NOVOLOG 100 UNIT/ML injection, Inject 0-25 Units into the skin daily. In insulin pump, Disp: , Rfl: 6 .  OVER THE COUNTER MEDICATION, Take 750 mg by mouth at bedtime. GABA, Disp: , Rfl:  .  OVER THE COUNTER MEDICATION, as directed. Magnesium gluconate, Disp: , Rfl:  .  OVER THE COUNTER MEDICATION, as directed. Omega EPA/DHA, Disp: , Rfl:  .  OVER THE COUNTER MEDICATION, as directed. Grape fruit seed extract, Disp: , Rfl:  .  potassium chloride (K-DUR,KLOR-CON) 10 MEQ tablet, Take 1 tablet (10 mEq total) by mouth 2 (two) times daily., Disp: 180 tablet, Rfl: 3 .  torsemide ([name]) 20 MG tablet, TAKE ONE TABLET BY MOUTH ONCE DAILY IN THE MORNING.  MAY  TAKE  AN  EXTRA  ONE-HALF  TABLET  AS  NEEDED, Disp: 135 tablet, Rfl: 3 .  vitamin C (ASCORBIC ACID) 500 MG tablet, Take 1 tablet (500 mg total) by mouth daily., Disp: 50 tablet, Rfl: 0 .  VITAMIN K PO, Take by mouth as directed., Disp: , Rfl:   PAST MEDICAL HISTORY: Past Medical History:  Diagnosis Date  . Anemia   . Arthritis   . 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 Chronic kidney disease    stage 3  . Coronary artery disease   . Depression   . Diabetes mellitus without complication (HCC)    Type 1  . Diabetic retinopathy ([place])   . Family history of adverse reaction to anesthesia    mother: " while she was under she stopped breathing."  . Fibromyalgia   . Gastroparesis   . GERD (gastroesophageal reflux disease)   . Headache    migraines  . Hypothyroidism   . IBS (irritable bowel syndrome)   . Idiopathic edema   . Lyme disease   . Mitral valve prolapse   . Myocardial infarction    1 major in 1999 and 2 minor " small vessel disease."  . Osteoporosis   . Peripheral neuropathy ([place])   . Peripheral vascular  disease ([place])   . 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 Stroke [facility])    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" " the second  stroke was a lacunar     PAST SURGICAL HISTORY: Past Surgical History:  Procedure Laterality Date  . ABDOMINAL HYSTERECTOMY    . APPENDECTOMY    . BREAST SURGERY     B/L biopsy and lumpectomy   . CARDIAC CATHETERIZATION    . CARPAL TUNNEL RELEASE    . CATARACT EXTRACTION W/ INTRAOCULAR LENS IMPLANT     right eye  . COLONOSCOPY W/ BIOPSIES AND POLYPECTOMY    . CORONARY ARTERY BYPASS GRAFT    . coronary artery stents     at [place] and [place]  . ECTOPIC PREGNANCY SURGERY    . NASAL SEPTUM SURGERY    . OPEN REDUCTION INTERNAL FIXATION (ORIF) DISTAL RADIAL FRACTURE Right 05/06/2015   Procedure: OPEN REDUCTION INTERNAL FIXATION (ORIF) RIGHT DISTAL RADIAL FRACTURE AND REPAIRS AS NEEDED;  Surgeon: Iran Planas, MD;  Location: [place];  Service: Orthopedics;  Laterality: Right;  . ORIF WRIST FRACTURE Left 11/05/2014  . ORIF WRIST FRACTURE Left 11/05/2014   Procedure: OPEN REDUCTION INTERNAL FIXATION (ORIF) LEFT WRIST FRACTURE AND REPAIR AS INDICATED;  Surgeon: Iran Planas, MD;  Location: [place];  Service: Orthopedics;  Laterality: Left;  . TRIGGER FINGER RELEASE      FAMILY HISTORY: Family History  Problem Relation Age of Onset  . Breast cancer Mother   . Heart disease Father   . Hypertension Father   . Breast cancer Sister     SOCIAL HISTORY:  Social History   Social History  . Marital status: Married    Spouse name: N/A  . Number of children: 0  . Years of education: college   Occupational History  . Retired    Social History Main Topics  . Smoking status: Former Smoker    Types: Cigarettes  . Smokeless tobacco: Never Used     Comment:  " Quit smoking cigarettes in 20's "  . Alcohol use Yes     Comment: occasional beer or wine  . Drug use: No  . Sexual activity: Not on file   Other Topics Concern  . Not on file   Social  History Narrative   Drinks 2-4 caffeien drinks a day      PHYSICAL EXAM  Vitals:   01/20/16 1046  BP: 115/69  Pulse: 63  Resp: 14  Weight: 103 lb (46.7 kg)  Height: _0  (1.499 m)    Body mass index is 20.8 kg/m.   General: The patient is well-developed and well-nourished and in no acute distress  Eyes:  Funduscopic exam shows normal optic discs and retinal vessels.  Neck: The neck is supple, no carotid bruits are noted.  The neck is nontender.  Cardiovascular: The heart has a regular rate and rhythm with a normal S1 and S2. There were no murmurs, gallops or rubs. Lungs are clear to auscultation.  Skin: Extremities are without significant edema.  Musculoskeletal:  Back is nontender  Neurologic Exam  Mental status: The patient is alert and oriented x 3 at the time of the examination. The patient has apparent normal recent and remote memory (3/3), with a mildly reduced attention span and concentration ability ([id number]; WORLD-DLROW).   Speech is normal.  Cranial nerves: Extraocular movements are full. Pupils are equal, round, and reactive to light and accomodation.  Visual fields are full.  Facial symmetry is present. There is good facial sensation to soft touch bilaterally.Facial strength is normal.  Trapezius and sternocleidomastoid strength is normal. No dysarthria is noted.  The tongue is midline, and  the patient has symmetric elevation of the soft palate. No obvious hearing deficits are noted.  Motor:  Muscle bulk is normal.   Tone is normal. Strength is  5 / 5 in all 4 extremities except 4+/5 toe extension..   Sensory: Sensory testing is intact to pinprick, soft touch and vibration sensation in arms but reduced sensation in her toes  Coordination: Cerebellar testing reveals good finger-nose-finger and heel-to-shin bilaterally.  Gait and station: Station is normal.   Gait is normal. Tandem gait is normal. Romberg is negative.   Reflexes: Deep tendon reflexes  are symmetric and normal bilaterally.   Plantar responses are flexor.    DIAGNOSTIC DATA (LABS, IMAGING, TESTING) - I reviewed patient records, labs, notes, testing and imaging myself where available.  Lab Results  Component Value Date   WBC 7.3 05/06/2015   HGB 11.0 (L) 05/06/2015   HCT 33.3 (L) 05/06/2015   MCV 87.4 05/06/2015   PLT 392 05/06/2015      Component Value Date/Time   NA 140 05/06/2015 1610   K 3.8 05/06/2015 1610   CL 103 05/06/2015 1610   CO2 23 05/06/2015 1610   GLUCOSE 84 05/06/2015 1610   BUN 23 (H) 05/06/2015 1610   CREATININE 1.22 (H) 05/06/2015 1610   CALCIUM 9.7 05/06/2015 1610   PROT 7.0 05/06/2015 1610   ALBUMIN 3.8 05/06/2015 1610   AST 28 05/06/2015 1610   ALT 17 05/06/2015 1610   ALKPHOS 73 05/06/2015 1610   BILITOT 0.7 05/06/2015 1610   GFRNONAA 47 (L) 05/06/2015 1610   GFRAA 55 (L) 05/06/2015 1610   Lab Results  Component Value Date   CHOL 172 06/29/2015   HDL 73 06/29/2015   LDLCALC 82 06/29/2015   TRIG 84 06/29/2015   CHOLHDL 2.4 06/29/2015       ASSESSMENT AND PLAN  Lacunar stroke ([place]) - Plan: MR BRAIN WO CONTRAST  Memory loss - Plan: MR BRAIN WO CONTRAST  Other fatigue  Depression, unspecified depression type  Coronary artery disease due to lipid rich plaque  Anemia due to stage 3 chronic kidney disease   In summary, Mrs. Profit is a 59 year old woman who was told that she had evidence of 2 strokes on MRI around 2010. However, an MRI of the brain performed at [facility] 2015 did not note any strokes though she does have some heel atherosclerotic stenosis in the left MCA and left PCA.    Since then, she is noting more neurologic symptoms including difficulty with memory.   I will check an MRI of the brain without contrast and try to get the MRI performed at [place] for comparison.    If she is having more vascular changes, consider changing from aspirin to Plavix or adding Plavix to the aspirin (I wwould need to  discuss with her cardiologist)  She also has a lot of fatigue and believes that this is due to chronic Lyme disease. She reports that Western blot testing was repeatedly negative but a doctor she sees in [place] who 'specializes in chronic-Lyme' did other tests that reportedly showed that she had chronic Lyme disease. She also states he told her that her immune system is "almost gone" but I note that she had a normal SPEP at [facility] or [place] and that she had a normal absolute differential.  He prescribed multiple treatments including chloroquine, frequent IVIG infusions and multiple homeopathic medications. I discussed with her that I do not think that those therapies are appropriate for her.  Even though she does not think that she has received any benefit these treatments (some not covered by her insurance), for the time being, she wants to continue on those therapies.  She will return to see me in several months or call sooner if she has new or worsening neurologic symptoms.  Mukesh Kornegay A. Felecia Shelling, MD, PhD 34/48/3015, 99:68 AM Certified in Neurology, Clinical Neurophysiology, Sleep Medicine, Pain Medicine and Neuroimaging  [facility] [address] [place], [place] [address] [phone]

## 2016-02-08 ENCOUNTER — Ambulatory Visit
Admission: RE | Admit: 2016-02-08 | Discharge: 2016-02-08 | Disposition: A | Discharge status: Home / Self Care | Payer: BLUE CROSS/BLUE SHIELD | Source: Ambulatory Visit | Attending: Neurology | Admitting: Neurology

## 2016-02-08 DIAGNOSIS — R413 Other amnesia: Secondary | ICD-10-CM | POA: Diagnosis not present

## 2016-02-08 DIAGNOSIS — I639 Cerebral infarction, unspecified: Secondary | ICD-10-CM

## 2016-02-08 DIAGNOSIS — I6381 Other cerebral infarction due to occlusion or stenosis of small artery: Secondary | ICD-10-CM

## 2016-02-15 ENCOUNTER — Telehealth: Payer: Self-pay | Admitting: Neurology

## 2016-02-15 ENCOUNTER — Telehealth: Payer: Self-pay | Admitting: *Deleted

## 2016-02-15 NOTE — Telephone Encounter (Signed)
-----   Message from Britt Bottom, MD sent at 02/08/2016  4:33 PM EST ----- Please let her know that the MRI of the brain showed a couple of small strokes from the past usually she has a lot of of atrophy, more than expected for age.   She had an MRI 2 years ago at [facility] and I would like to see if I can get that for comparison. We will discuss further at her next visit.mi

## 2016-02-15 NOTE — Telephone Encounter (Signed)
Patient is returning your call regarding MRI results. She said it is alright to leave a VM on her phone.

## 2016-02-15 NOTE — Telephone Encounter (Signed)
See result note/fim 

## 2016-02-15 NOTE — Telephone Encounter (Signed)
I have spoken with Brittney Pace this afternoon and per RAS, reviewed MRI results as below.  Release for CD of MRI 2 yrs. ago at [facility] has been faxed--waiting on MRI to arrive, then will call pt. back again once RAS has compared the 2 MRI's.  She verbalized understanding of same/fim

## 2016-02-23 ENCOUNTER — Ambulatory Visit (INDEPENDENT_AMBULATORY_CARE_PROVIDER_SITE_OTHER): Payer: BLUE CROSS/BLUE SHIELD | Admitting: Osteopathic Medicine

## 2016-02-23 VITALS — BP 146/69 | HR 65 | Temp 97.9°F | Wt 103.0 lb

## 2016-02-23 DIAGNOSIS — I639 Cerebral infarction, unspecified: Secondary | ICD-10-CM

## 2016-02-23 DIAGNOSIS — F339 Major depressive disorder, recurrent, unspecified: Secondary | ICD-10-CM

## 2016-02-23 DIAGNOSIS — R5382 Chronic fatigue, unspecified: Secondary | ICD-10-CM | POA: Diagnosis not present

## 2016-02-23 DIAGNOSIS — F338 Other recurrent depressive disorders: Secondary | ICD-10-CM

## 2016-02-23 MED ORDER — FLUOXETINE HCL 10 MG PO CAPS: 10.0000 mg | ORAL_CAPSULE | Freq: Every day | ORAL | 1 refills | Status: DC

## 2016-02-23 NOTE — Progress Notes (Signed)
HPI: Brittney Pace is a 61 y.o. female who presents to [facility] 02/23/16 for chief complaint of:  Chief Complaint  Patient presents with  . Fatigue    Fatigue . Context/Quality: Concerned about the flu - Felt sick back in November, over 2 months ago, and she still like she hasn't fully recovered. Still feels tired is her greatest complaint, no: fever cough abdominal pain nausea vomiting diarrhea shortness of breath chest pain . Location: generalized  . Assoc signs/symptoms: see ROS. States seasonal affective disorder may be a factor. No hypoglycemia . Duration: has been going on since before Thanksgiving.  . Modifying factors: has tried the following OTC/Rx medications: none  For SAD other than SAM-E. Has been on antidepressant in the past: Wellbutrin which didn't help, Zoloft.    Past medical, social and family history reviewed. Current medications and allergies reviewed.     Review of Systems:  Constitutional: No  fever/chills  HEENT: No  headache, No  sore throat, No  swollen glands  Cardiovascular: No chest pain  Respiratory:No  cough, No  shortness of breath  Gastrointestinal: No  nausea, No  vomiting,  No  diarrhea  Musculoskeletal:   No  myalgia/arthralgia  Skin/Integument:  No  rash   Exam:  BP (!) 146/69   Pulse 65   Temp 97.9 F (36.6 C) (Oral)   Wt 103 lb (46.7 kg)   SpO2 100%   BMI 20.80 kg/m   Constitutional: VSS, see above. General Appearance: alert, well-developed, well-nourished, NAD  Eyes: Normal lids and conjunctive, non-icteric sclera, EOMI  Ears, Nose, Mouth, Throat: Normal external inspection ears/nares/mouth/lips/gums, MMM  Neck: No masses, trachea midline. normal lymph nodes  Respiratory: Normal respiratory effort. No  wheeze/rhonchi/rales  Cardiovascular: S1/S2 normal, no murmur/rub/gallop auscultated. RRR.    ASSESSMENT/PLAN: Trial optimization of diet/exercise. SSRI for seasonal affective  disorder since fatigue is so significant. Labs reviewed, recent labs from hematology show mild anemia, normal iron levels. We'll recheck electrolytes and thyroid at this point. No signs or symptoms of infection. Trial low-dose SSRI with option to increase dose in 2 weeks. Return to clinic if anything worsens or changes  Chronic fatigue - Plan: CBC with Differential/Platelet, COMPLETE METABOLIC PANEL WITH GFR, TSH  Seasonal affective disorder ([place]) - Plan: FLUoxetine (PROZAC) 10 MG capsule    Visit summary was printed for the patient with medications and pertinent instructions for patient to review. ER/RTC precautions reviewed. All questions answered. Return if symptoms worsen or fail to improve.

## 2016-02-24 LAB — CBC WITH DIFFERENTIAL/PLATELET
Basophils Absolute: 63 cells/uL (ref 0–200)
Basophils Relative: 1 %
Eosinophils Absolute: 315 cells/uL (ref 15–500)
Eosinophils Relative: 5 %
HCT: 34 % — ABNORMAL LOW (ref 35.0–45.0)
Hemoglobin: 10.9 g/dL — ABNORMAL LOW (ref 11.7–15.5)
Lymphocytes Relative: 32 %
Lymphs Abs: 2016 cells/uL (ref 850–3900)
MCH: 29.6 pg (ref 27.0–33.0)
MCHC: 32.1 g/dL (ref 32.0–36.0)
MCV: 92.4 fL (ref 80.0–100.0)
MPV: 9.5 fL (ref 7.5–12.5)
Monocytes Absolute: 504 cells/uL (ref 200–950)
Monocytes Relative: 8 %
Neutro Abs: 3402 cells/uL (ref 1500–7800)
Neutrophils Relative %: 54 %
Platelets: 336 10*3/uL (ref 140–400)
RBC: 3.68 MIL/uL — ABNORMAL LOW (ref 3.80–5.10)
RDW: 13.5 % (ref 11.0–15.0)
WBC: 6.3 10*3/uL (ref 3.8–10.8)

## 2016-02-24 LAB — COMPLETE METABOLIC PANEL WITH GFR
ALT: 13 U/L (ref 6–29)
AST: 22 U/L (ref 10–35)
Albumin: 3.9 g/dL (ref 3.6–5.1)
Alkaline Phosphatase: 56 U/L (ref 33–130)
BUN: 43 mg/dL — ABNORMAL HIGH (ref 7–25)
CO2: 29 mmol/L (ref 20–31)
Calcium: 9.7 mg/dL (ref 8.6–10.4)
Chloride: 98 mmol/L (ref 98–110)
Creat: 1.44 mg/dL — ABNORMAL HIGH (ref 0.50–0.99)
GFR, Est African American: 45 mL/min — ABNORMAL LOW (ref >=60)
GFR, Est Non African American: 39 mL/min — ABNORMAL LOW (ref >=60)
Glucose, Bld: 161 mg/dL — ABNORMAL HIGH (ref 65–99)
Potassium: 4.6 mmol/L (ref 3.5–5.3)
Sodium: 136 mmol/L (ref 135–146)
Total Bilirubin: 0.5 mg/dL (ref 0.2–1.2)
Total Protein: 6.8 g/dL (ref 6.1–8.1)

## 2016-02-24 LAB — TSH: TSH: 1.13 mIU/L

## 2016-03-14 NOTE — Telephone Encounter (Signed)
Patient is calling back to discuss MRI.

## 2016-03-15 NOTE — Telephone Encounter (Signed)
I have spoken with Brittney Pace this morning.  Dr. Felecia Shelling requested CD of MRI brain done at [facility] in 2015, for comparison, but I do not see that we have received it.  She will call [facility] to request it be mailed to our office/fim

## 2016-03-15 NOTE — Telephone Encounter (Signed)
I havent received it yet

## 2016-03-17 NOTE — Telephone Encounter (Signed)
[facility] that RAS has not received cd of MRI that was requested in [facility] mail to RAS at [facility], address given/fim

## 2016-03-22 NOTE — Telephone Encounter (Signed)
MRI on CD received from [facility] this morning.  I have spoken with Vyla to let her know.  MRI in RAS box for review/fim

## 2016-04-04 ENCOUNTER — Other Ambulatory Visit: Payer: Self-pay | Admitting: Osteopathic Medicine

## 2016-04-04 DIAGNOSIS — F338 Other recurrent depressive disorders: Secondary | ICD-10-CM

## 2016-04-20 DIAGNOSIS — G629 Polyneuropathy, unspecified: Secondary | ICD-10-CM | POA: Insufficient documentation

## 2016-05-03 DIAGNOSIS — M810 Age-related osteoporosis without current pathological fracture: Secondary | ICD-10-CM | POA: Insufficient documentation

## 2016-05-19 ENCOUNTER — Ambulatory Visit (INDEPENDENT_AMBULATORY_CARE_PROVIDER_SITE_OTHER): Payer: BLUE CROSS/BLUE SHIELD | Admitting: Osteopathic Medicine

## 2016-05-19 VITALS — BP 131/68 | HR 62 | Ht 59.0 in | Wt 100.0 lb

## 2016-05-19 DIAGNOSIS — E1059 Type 1 diabetes mellitus with other circulatory complications: Secondary | ICD-10-CM

## 2016-05-19 DIAGNOSIS — Z8673 Personal history of transient ischemic attack (TIA), and cerebral infarction without residual deficits: Secondary | ICD-10-CM | POA: Diagnosis not present

## 2016-05-19 DIAGNOSIS — I639 Cerebral infarction, unspecified: Secondary | ICD-10-CM

## 2016-05-19 DIAGNOSIS — I5032 Chronic diastolic (congestive) heart failure: Secondary | ICD-10-CM | POA: Diagnosis not present

## 2016-05-19 DIAGNOSIS — N189 Chronic kidney disease, unspecified: Secondary | ICD-10-CM | POA: Diagnosis not present

## 2016-05-19 DIAGNOSIS — D631 Anemia in chronic kidney disease: Secondary | ICD-10-CM | POA: Diagnosis not present

## 2016-05-19 DIAGNOSIS — R5382 Chronic fatigue, unspecified: Secondary | ICD-10-CM

## 2016-05-19 NOTE — Progress Notes (Signed)
HPI: Brittney Pace is a 61 y.o. female  who presents to [facility] today, 05/19/16,  for chief complaint of:  Chief Complaint  Patient presents with  . Follow-up    FATIGUE    Fatigue no better. Tried fluoxetine for SAD but noticed no difference so stopped this medicine. Is following with several specialists: neurology (Hx CVA, recent MRI reviewed +cortical atrophy, memory concerns), endocrine (Hx DM1), "lyme specialist" in [place] (has some concerns for chronic Lyme, planning for testing cytokine levels and consideration of immunotherapy, planning for hyperbaric O2 treatment), HemOnc (significant anemia d/t CKD and undergoing aranesp treatment most recently yesterday, Hgb dropped substantially form last month), Rheum.   Depression is a concern but she is reluctant to take medications for this. Previous Prozac, Wellbutrin, Cymbalta were not helpful or caused side effects.   She would like to renew DNR order. No known AD/HCPOA otherwise. Pt states she is realistic about her chronic illnesses and she does not want heroics if she should experience a cardiac arrest.    Past medical history, surgical history, social history and family history reviewed.  Patient Active Problem List   Diagnosis Date Noted  . Lacunar stroke ([place]) 01/20/2016  . Memory loss 01/20/2016  . Other fatigue 01/20/2016  . CAD (coronary artery disease) of artery bypass graft 01/20/2016  . Depression 01/20/2016  . Right lateral epicondylitis 12/16/2015  . 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 Lyme disease 12/16/2015  . Hyperlipidemia 09/25/2015  . Myopia of both eyes with astigmatism and presbyopia 05/21/2015  . Vitreous syneresis of both eyes 05/21/2015  . Combined form of age-related cataract, left eye 05/21/2015  . Surgery, elective 05/06/2015  . CAD (coronary artery disease) 11/19/2014  . Diabetes mellitus type I ([place]) 11/19/2014  . DDD (degenerative disc disease), cervical 03/11/2014   . Vitreous hemorrhage of left eye ([place]) 03/03/2014  . DDD (degenerative disc disease), thoracic 01/07/2014  . Anemia due to stage 3 chronic kidney disease 12/16/2013  . Osteoporosis 11/14/2013  . Chronic diastolic heart failure ([place]) 09/18/2013  . Cataract due to secondary diabetes ([place]) 08/26/2013  . 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 Hypothyroidism due to acquired atrophy of thyroid 08/02/2013  . Cerebral artery occlusion with cerebral infarction ([place]) 07/14/2013  . History of diabetic gastroparesis 04/09/2013  . Irritable bowel syndrome with diarrhea 04/09/2013  . Anemia of chronic disease 04/08/2013  . 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   Current Outpatient Prescriptions on File Prior to Visit  Medication Sig Dispense Refill  . acyclovir (ZOVIRAX) 400 MG tablet Take 1 tablet (400 mg total) by mouth 2 (two) times daily. 180 tablet 3  . Alpha-Lipoic Acid 600 MG CAPS Take 600 mg by mouth 2 (two) times daily.     Marland Kitchen arginine 500 MG tablet Take 500 mg by mouth daily.    Marland Kitchen aspirin EC 325 MG tablet Take 325 mg by mouth at bedtime.    . carvedilol (COREG) 3.125 MG tablet TAKE ONE TABLET BY MOUTH TWICE DAILY WITH MEALS 180 tablet 3  . cetirizine (ZYRTEC) 10 MG tablet Take 10 mg by mouth at bedtime.    . Cholecalciferol (VITAMIN D3) 5000 UNITS TABS Take 1,000 Units by mouth daily.     . Cyanocobalamin (VITAMIN DEFICIENCY SYSTEM-B12) 1000 MCG/ML KIT Inject 1,000 mcg as directed every 3 (three) days.    Marland Kitchen  diclofenac sodium (VOLTAREN) 1 % GEL Apply 2 g topically 4 (four) times daily. To affected joint. 100 g 11  . Esomeprazole Magnesium (NEXIUM PO) Take 20 mg by mouth every morning.     Marland Kitchen FLUoxetine (PROZAC) 10 MG capsule TAKE ONE CAPSULE BY MOUTH DAILY FOR 2 WEEKS, OPTION TO INCREASE TO 2 CAPSULES  DAILY OR STAY ON 1 CAPSULE DAILY- CALL DR Mihran Lebarron 60 capsule 1  . hydroxychloroquine (PLAQUENIL) 200 MG tablet Take by mouth daily.    . Insulin Human (INSULIN PUMP) SOLN Inject into the skin continuous. Novolog insulin    . isosorbide mononitrate (IMDUR) 120 MG 24 hr tablet Take 1 tablet (120 mg total) by mouth at bedtime. 90 tablet 3  . levothyroxine (SYNTHROID, LEVOTHROID) 25 MCG tablet Take 25 mcg by mouth daily before breakfast.    . Linoleic Acid Conjugated 1000 MG CAPS Take 1,000 mg by mouth daily.     . Melatonin 500 MCG TBDP Take 500 mcg by mouth at bedtime as needed (sleep).     . Methylsulfonylmethane (MSM) 1000 MG TABS Take by mouth. 1-4 times a day    . nitroGLYCERIN (NITROLINGUAL) 0.4 MG/SPRAY spray Place 1 spray under the tongue every 5 (five) minutes x 3 doses as needed for chest pain. 12 g 1  . NON FORMULARY Chloroella    . NOVOLOG 100 UNIT/ML injection Inject 0-25 Units into the skin daily. In insulin pump  6  . OVER THE COUNTER MEDICATION Take 750 mg by mouth at bedtime. GABA    . OVER THE COUNTER MEDICATION as directed. Magnesium gluconate    . OVER THE COUNTER MEDICATION as directed. Omega EPA/DHA    . OVER THE COUNTER MEDICATION as directed. Grape fruit seed extract    . potassium chloride (K-DUR,KLOR-CON) 10 MEQ tablet Take 1 tablet (10 mEq total) by mouth 2 (two) times daily. 180 tablet 3  . torsemide ([name]) 20 MG tablet TAKE ONE TABLET BY MOUTH ONCE DAILY IN THE MORNING.  MAY  TAKE  AN  EXTRA  ONE-HALF  TABLET  AS  NEEDED 135 tablet 3  . vitamin C (ASCORBIC ACID) 500 MG tablet Take 1 tablet (500 mg total) by mouth daily. 50 tablet 0  . VITAMIN K PO Take by mouth as directed.     No current facility-administered medications on file prior to visit.        Review of Systems:  Constitutional: No recent illness, +chronic stable fatigue  HEENT: No  headache  Cardiac: No  chest pain, No  pressure, No palpitations  Respiratory:  No  shortness of breath. No   Cough  Gastrointestinal: No  abdominal pain  Musculoskeletal: No new myalgia/arthralgia  Skin: No  Rash  Neurologic: +generalized weakness, No  Dizziness  Psychiatric: +concerns with depression, No  concerns with anxiety  Exam:  BP 131/68   Pulse 62   Ht '4\' 11"'$  (1.499 m)   Wt 100 lb (45.4 kg)   BMI 20.20 kg/m   Constitutional: VS see above. General Appearance: alert, well-developed, well-nourished, NAD  Respiratory: Normal respiratory effort. no wheeze, no rhonchi, no rales  Cardiovascular: S1/S2 normal, no murmur, no rub/gallop auscultated. RRR.   Musculoskeletal: Gait normal. Symmetric and independent movement of all extremities  Neurological: Normal balance/coordination. No tremor.  Skin: warm, dry, intact.   Psychiatric: Normal judgment/insight. Normal mood and affect. Oriented x3. No SI/HI. No thought disorder     ASSESSMENT/PLAN: The primary encounter diagnosis was Chronic fatigue. Diagnoses of Type 1 diabetes  mellitus with other circulatory complication ([place]), History of lacunar cerebrovascular accident (CVA), Chronic diastolic heart failure ([place]), and Anemia in chronic kidney disease, unspecified CKD stage were also pertinent to this visit.   Unfortunately not much to offer at this point with regard to fatigue options. Likely multifactorial.   Advised we can consider alternative antidepressant or we can wait and see how Lyme specialist's plan might help. Pt would like to wait  Has disability forms for me to complete. Will work on these on admin day.   DNR order completed. MOST form provided for her to review if desired. Recommended completion of advanced directive.   Chronic conditions stable at this point, would recommend follow-up here q6 mos routine or sooner as needed     Follow-up plan: Return for [place] D[facility], sooner if needed / desire Rx for depression.  Visit summary with medication list and pertinent instructions was printed for  patient to review, alert [place] if any changes needed. All questions at time of visit were answered - patient instructed to contact office with any additional concerns. ER/RTC precautions were reviewed with the patient and understanding verbalized.   Note: Total time spent 40 minutes, greater than 50% of the visit was spent face-to-face counseling and coordinating care for the following: The primary encounter diagnosis was Chronic fatigue. Diagnoses of Type 1 diabetes mellitus with other circulatory complication ([place]), History of lacunar cerebrovascular accident (CVA), Chronic diastolic heart failure ([place]), and Anemia in chronic kidney disease, unspecified CKD stage were also pertinent to this visit.Marland Kitchen

## 2016-05-20 ENCOUNTER — Telehealth: Payer: Self-pay | Admitting: Osteopathic Medicine

## 2016-05-20 NOTE — Telephone Encounter (Signed)
Please call patient: I have completed my portion of the paperwork she brought to her visit the other day, it is up front for her to pick up at her convenience.

## 2016-05-24 ENCOUNTER — Ambulatory Visit (INDEPENDENT_AMBULATORY_CARE_PROVIDER_SITE_OTHER): Payer: BLUE CROSS/BLUE SHIELD | Admitting: Neurology

## 2016-05-24 ENCOUNTER — Other Ambulatory Visit: Payer: Self-pay | Admitting: Internal Medicine

## 2016-05-24 ENCOUNTER — Encounter: Payer: Self-pay | Admitting: Neurology

## 2016-05-24 VITALS — BP 120/67 | HR 60 | Resp 16 | Ht 59.0 in | Wt 99.5 lb

## 2016-05-24 DIAGNOSIS — G319 Degenerative disease of nervous system, unspecified: Secondary | ICD-10-CM | POA: Diagnosis not present

## 2016-05-24 DIAGNOSIS — I6381 Other cerebral infarction due to occlusion or stenosis of small artery: Secondary | ICD-10-CM

## 2016-05-24 DIAGNOSIS — R5383 Other fatigue: Secondary | ICD-10-CM

## 2016-05-24 DIAGNOSIS — I639 Cerebral infarction, unspecified: Secondary | ICD-10-CM | POA: Diagnosis not present

## 2016-05-24 DIAGNOSIS — R519 Other chronic pain: Secondary | ICD-10-CM | POA: Insufficient documentation

## 2016-05-24 DIAGNOSIS — R51 Headache: Secondary | ICD-10-CM | POA: Diagnosis not present

## 2016-05-24 DIAGNOSIS — G8929 Other chronic pain: Secondary | ICD-10-CM | POA: Insufficient documentation

## 2016-05-24 DIAGNOSIS — E1029 Type 1 diabetes mellitus with other diabetic kidney complication: Secondary | ICD-10-CM

## 2016-05-24 MED ORDER — ARMODAFINIL 250 MG PO TABS: ORAL_TABLET | ORAL | 5 refills | Status: DC

## 2016-05-24 NOTE — Telephone Encounter (Signed)
Left VM with information.  

## 2016-05-24 NOTE — Progress Notes (Signed)
[facility]  PATIENT: Brittney Pace DOB: February 06, 1955  REFERRING DOCTOR OR PCP:  Emeterio Reeve, DO SOURCE: patient, notes from PCP office  _________________________________   HISTORICAL  CHIEF COMPLAINT:  Chief Complaint  Patient presents with  . History of CVA    Denies new stroke sx.  Sts. fatigue, depression, are all worse, treated by her pcp.  Recently stopped Prozac as she did not feel well on it/fim    HISTORY OF PRESENT ILLNESS:  Brittney Pace is a 61 yo woman with a  history of stroke.    She notes chronic fatigue that is worsening.  She feels that the fatigue has been disabling and she is unable to do sustained tasks.  I personally reviewed the MRI of the brain performed recently and compared it with the MRI of the brain that was performed at [place] in 2015.   The MRI of the brain from 2015 was reportedly normal for age. However, by my review there is moderately severe generalized cortical atrophy, chronic microvascular ischemic changes and a lacunar infarction in the left pons. I compare those images side by side to a more recent MRI performed in January 2018. The extent of atrophy and chronic microvascular ischemic change is similar. However, there does appear to be a second lacunar infarction in the pons on the more recent study that was not clearly evident on the 2015 study.  She also reports a headache on the left that has been daily the past month.   The pain is present when she wakes up and sometimes intensifies further as the day goes on. The pain is located in the neck as well as the head.  Currently, she reports symptoms including lightheadedness, dizziness, migraines, reduced memory.  She sleeps well at night and always feels sleepy. She snores some but does not wake up gasping for air.  She  had a PSG in 2000 which showed no OSA.  She feels depressed but did not like antidepressants and just takes SAM-E now.     She has IDDM, CAD  (h/o MI and CABG 1999), anemia, chronic renal insufficiency, Lyme disease by her report.   She states the Lyme disease was untreated and undiagnosed x 5 years and the specialist she sees in [place] says she will have it for years.     He has placed her on Plaquenil and B12  History from initial visit:   She has a history of stroke x 2.   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 She had neck pain and she states she was told she ruptured 2 discs in her neck   She reports that she had severe headaches an balance was off.   These symptoms lasted several months and she went out of disability (actually ws ofr DM polyneuropathy).   In 2010, she was carrying groceries and she lost vision followed by going down to the floor and losing consciousness x several seconds.   She was in [place] at the time.  The MRI in 2010 reportedly showed a lacunar stroke in the right thalamus and a brainstem stroke.   [facility] however, was reportedly normal for age, though the MRA did show intracranial stenosis.  MR angiogram report from 2015 shows narrowing of the left MCA (anterior superior division at its origin) and moderate narrowing of the left P2 P3 junction and subtle  narrowing distally.   MR angiogram of the neck did not show significant stenosis.    Carotid arteries 11/12/2013 did not show significant stenosis by Doppler/US.    Cervical spine x-ray 03/11/2014 showed chronic bony fragment adjacent to the endplates of C5, trace retrolisthesis of C3 upon C4, grade 1 anterolisthesis of C4 upon C5 and moderate multilevel degenerative changes worse at C4-C5 and C5-C6.   MRI of the thoracic spine 11/24/2013 showed anterior height loss (less than 20%) of T6, T7-T8 and T9 with edema in the above these levels which was felt to favor Modic degenerative change more than change from fracture (still in differential)   partially, actual  images were not available.       REVIEW OF SYSTEMS: Constitutional: No fevers, chills, sweats, or change in appetite.  Notes fatigue  Eyes: No visual changes, double vision, eye pain Ear, nose and throat: No hearing loss, ear pain, nasal congestion, sore throat Cardiovascular: h/o MI, has CAD s/p CABG Respiratory: No shortness of breath at rest or with exertion.   No wheezes GastrointestinaI: No nausea, vomiting, diarrhea, abdominal pain, fecal incontinence Genitourinary: No dysuria, urinary retention or frequency.  No nocturia. Musculoskeletal: Notes neck pain and thoracic and lower back pain Integumentary: No rash, pruritus, skin lesions Neurological: as above Psychiatric: Notes mild depression at this time.  No anxiety Endocrine: No palpitations, diaphoresis, change in appetite, change in weigh or increased thirst Hematologic/Lymphatic: No anemia, purpura, petechiae. Allergic/Immunologic: No itchy/runny eyes, nasal congestion, recent allergic reactions, rashes  ALLERGIES: Allergies  Allergen Reactions  . Dilaudid [Hydromorphone] Other (See Comments)    Because of continuous glucose monitor  . Morphine Anaphylaxis and Shortness Of Breath  . Morphine And Related Shortness Of Breath  . Penicillins Hives    Has patient had a PCN reaction causing immediate rash, facial/tongue/throat swelling, SOB or lightheadedness with hypotension: Yes Has patient had a PCN reaction causing severe rash involving mucus membranes or skin necrosis: No Has patient had a PCN reaction that required hospitalization No Has patient had a PCN reaction occurring within the last 10 years: No If all of the above answers are "NO", then may proceed with Cephalosporin use.  . Tramadol Anaphylaxis  . Amitriptyline Other (See Comments)    Severe headache/ out of body feeling  . Codeine Other (See Comments)    Severe headaches/ out of body feeling  . Darvon [Propoxyphene] Other (See Comments)    Severe  headache Severe headache Severe headaches / out of body feeling  . Other Swelling and Other (See Comments)    Cats itching Mold sinuses Cats itching Mold sinuses Cat hair and scratches cause swelling  . Statins Other (See Comments)    Muscle pains  . Sulfamethoxazole Other (See Comments)  . Acetaminophen Other (See Comments)    Alters insulin pump readings Alters insulin pump readings  . Advil [Ibuprofen] Other (See Comments)    Messes up CGM reading on glucose monitor  . Gluten Meal Diarrhea and Nausea And Vomiting    Cramping Cramping  . Mold Extract [Trichophyton] Other (See Comments)    sinusitis  . Sulfa Antibiotics Other (See Comments)    Mouth ulcers  . Sulfites Other (See Comments)    Mouth ulcers  . Ultracet [Tramadol-Acetaminophen] Other (See Comments)    Small vessel heart attack  . Wheat Bran Diarrhea and Nausea And Vomiting    Cramping Cramping    HOME MEDICATIONS:  Current Outpatient Prescriptions:  .  acyclovir (ZOVIRAX) 400 MG tablet, Take  1 tablet (400 mg total) by mouth 2 (two) times daily., Disp: 180 tablet, Rfl: 3 .  Alpha-Lipoic Acid 600 MG CAPS, Take 600 mg by mouth 2 (two) times daily. , Disp: , Rfl:  .  arginine 500 MG tablet, Take 500 mg by mouth daily., Disp: , Rfl:  .  aspirin EC 325 MG tablet, Take 325 mg by mouth at bedtime., Disp: , Rfl:  .  carvedilol (COREG) 3.125 MG tablet, TAKE ONE TABLET BY MOUTH TWICE DAILY WITH MEALS, Disp: 180 tablet, Rfl: 3 .  cetirizine (ZYRTEC) 10 MG tablet, Take 10 mg by mouth at bedtime., Disp: , Rfl:  .  Cholecalciferol (VITAMIN D3) 5000 UNITS TABS, Take 1,000 Units by mouth daily. , Disp: , Rfl:  .  Cyanocobalamin (VITAMIN DEFICIENCY SYSTEM-B12) 1000 MCG/ML KIT, Inject 1,000 mcg as directed every 3 (three) days., Disp: , Rfl:  .  diclofenac sodium (VOLTAREN) 1 % GEL, Apply 2 g topically 4 (four) times daily. To affected joint., Disp: 100 g, Rfl: 11 .  Esomeprazole Magnesium (NEXIUM PO), Take 20 mg by mouth  every morning. , Disp: , Rfl:  .  hydroxychloroquine (PLAQUENIL) 200 MG tablet, Take by mouth daily., Disp: , Rfl:  .  Insulin Human (INSULIN PUMP) SOLN, Inject into the skin continuous. Novolog insulin, Disp: , Rfl:  .  isosorbide mononitrate (IMDUR) 120 MG 24 hr tablet, Take 1 tablet (120 mg total) by mouth at bedtime., Disp: 90 tablet, Rfl: 3 .  levothyroxine (SYNTHROID, LEVOTHROID) 25 MCG tablet, Take 25 mcg by mouth daily before breakfast., Disp: , Rfl:  .  Linoleic Acid Conjugated 1000 MG CAPS, Take 1,000 mg by mouth daily. , Disp: , Rfl:  .  Methylsulfonylmethane (MSM) 1000 MG TABS, Take by mouth. 1-4 times a day, Disp: , Rfl:  .  nitroGLYCERIN (NITROLINGUAL) 0.4 MG/SPRAY spray, Place 1 spray under the tongue every 5 (five) minutes x 3 doses as needed for chest pain., Disp: 12 g, Rfl: 1 .  NON FORMULARY, Chloroella, Disp: , Rfl:  .  OVER THE COUNTER MEDICATION, Take 750 mg by mouth at bedtime. GABA, Disp: , Rfl:  .  OVER THE COUNTER MEDICATION, as directed. Magnesium gluconate, Disp: , Rfl:  .  OVER THE COUNTER MEDICATION, as directed. Omega EPA/DHA, Disp: , Rfl:  .  OVER THE COUNTER MEDICATION, as directed. Grape fruit seed extract, Disp: , Rfl:  .  potassium chloride (K-DUR,KLOR-CON) 10 MEQ tablet, Take 1 tablet (10 mEq total) by mouth 2 (two) times daily., Disp: 180 tablet, Rfl: 3 .  torsemide ([name]) 20 MG tablet, TAKE ONE TABLET BY MOUTH ONCE DAILY IN THE MORNING.  MAY  TAKE  AN  EXTRA  ONE-HALF  TABLET  AS  NEEDED, Disp: 135 tablet, Rfl: 3 .  vitamin C (ASCORBIC ACID) 500 MG tablet, Take 1 tablet (500 mg total) by mouth daily., Disp: 50 tablet, Rfl: 0 .  VITAMIN K PO, Take by mouth as directed., Disp: , Rfl:  .  Armodafinil 250 MG tablet, Take 1/2 to 1 pill in the mornings, Disp: 30 tablet, Rfl: 5 .  FLUoxetine (PROZAC) 10 MG capsule, TAKE ONE CAPSULE BY MOUTH DAILY FOR 2 WEEKS, OPTION TO INCREASE TO 2 CAPSULES DAILY OR STAY ON 1 CAPSULE DAILY- CALL DR Sheppard Coil (Patient not taking:  Reported on 05/24/2016), Disp: 60 capsule, Rfl: 1 .  Melatonin 500 MCG TBDP, Take 500 mcg by mouth at bedtime as needed (sleep). , Disp: , Rfl:   PAST MEDICAL HISTORY: Past Medical  History:  Diagnosis Date  . Anemia   . Arthritis   . 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 Chronic kidney disease    stage 3  . Coronary artery disease   . Depression   . Diabetes mellitus without complication (HCC)    Type 1  . Diabetic retinopathy ([place])   . Family history of adverse reaction to anesthesia    mother: " while she was under she stopped breathing."  . Fibromyalgia   . Gastroparesis   . GERD (gastroesophageal reflux disease)   . Headache    migraines  . Hypothyroidism   . IBS (irritable bowel syndrome)   . Idiopathic edema   . Lyme disease   . Mitral valve prolapse   . Myocardial infarction ([place])    1 major in 1999 and 2 minor " small vessel disease."  . Osteoporosis   . Peripheral neuropathy   . Peripheral vascular disease ([place])   . 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 Stroke [facility])    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" " the second stroke was a lacunar     PAST SURGICAL HISTORY: Past Surgical History:  Procedure Laterality Date  . ABDOMINAL HYSTERECTOMY    . APPENDECTOMY    . BREAST SURGERY     B/L biopsy and lumpectomy   . CARDIAC CATHETERIZATION    . CARPAL TUNNEL RELEASE    . CATARACT EXTRACTION W/ INTRAOCULAR LENS IMPLANT     right eye  . COLONOSCOPY W/ BIOPSIES AND POLYPECTOMY    . CORONARY ARTERY BYPASS GRAFT    . coronary artery stents     at [facility] and [place]  . ECTOPIC PREGNANCY SURGERY    . NASAL SEPTUM SURGERY    . OPEN REDUCTION INTERNAL FIXATION (ORIF) DISTAL RADIAL FRACTURE Right 05/06/2015   Procedure: OPEN REDUCTION INTERNAL FIXATION (ORIF) RIGHT DISTAL RADIAL FRACTURE AND REPAIRS AS NEEDED;  Surgeon: Iran Planas, MD;  Location: [facility];  Service: Orthopedics;  Laterality: Right;  . ORIF WRIST FRACTURE Left 11/05/2014  . ORIF  WRIST FRACTURE Left 11/05/2014   Procedure: OPEN REDUCTION INTERNAL FIXATION (ORIF) LEFT WRIST FRACTURE AND REPAIR AS INDICATED;  Surgeon: Iran Planas, MD;  Location: [facility];  Service: Orthopedics;  Laterality: Left;  . TRIGGER FINGER RELEASE      FAMILY HISTORY: Family History  Problem Relation Age of Onset  . Breast cancer Mother   . Heart disease Father   . Hypertension Father   . Breast cancer Sister     SOCIAL HISTORY:  Social History   Social History  . Marital status: Married    Spouse name: N/A  . Number of children: 0  . Years of education: college   Occupational History  . Retired    Social History Main Topics  . Smoking status: Former Smoker    Types: Cigarettes  . Smokeless tobacco: Never Used     Comment:  " Quit smoking cigarettes in 20's "  . Alcohol use Yes     Comment: occasional beer or wine  . Drug use: No  . Sexual activity: Not on file   Other Topics Concern  . Not on file   Social History Narrative   Drinks 2-4 caffeien drinks a day      PHYSICAL EXAM  Vitals:   05/24/16 1427  BP: 120/67  Pulse: 60  Resp: 16  Weight: 99 lb 8 oz (45.1 kg)  Height:  $'4\' 11"'e$  (1.499 m)    Body mass index is 20.1 kg/m.   General: The patient is well-developed and well-nourished and in no acute distress  Eyes:  Funduscopic exam shows normal optic discs and retinal vessels.  Neck: The neck is supple, no carotid bruits are noted.  The neck is  Tender, worse at the left occiput and cervical paraspinals  Cardiovascular: The heart has a regular rate and rhythm with a normal S1 and S2. There were no murmurs, gallops or rubs. Lungs are clear to auscultation.  Skin: Extremities are without significant edema.  Musculoskeletal:  Back is nontender  Neurologic Exam  Mental status: The patient is alert and oriented x 3 at the time of the examination. The patient has apparent normal recent and remote memory  Speech is normal.  Cranial nerves: Extraocular  movements are full. Pupils are equal, round, and reactive to light and accomodation.  Visual fields are full.  Facial symmetry is present. There is good facial sensation to soft touch bilaterally.Facial strength is normal.  Trapezius and sternocleidomastoid strength is normal. No dysarthria is noted.  The tongue is midline, and the patient has symmetric elevation of the soft palate. No obvious hearing deficits are noted.  Motor:  Muscle bulk is normal.   Tone is normal. Strength is  5 / 5 in all 4 extremities except 4+/5 toe extension..   Sensory: Sensory testing is intact to pinprick, soft touch and vibration sensation in arms but reduced sensation in her toes  Coordination: Cerebellar testing reveals good finger-nose-finger and heel-to-shin bilaterally.  Gait and station: Station is normal.   Gait is normal. Tandem gait is normal for age. Romberg is negative.   Reflexes: Deep tendon reflexes are symmetric and normal bilaterally.   Plantar responses are flexor.    DIAGNOSTIC DATA (LABS, IMAGING, TESTING) - I reviewed patient records, labs, notes, testing and imaging myself where available.  Lab Results  Component Value Date   WBC 6.3 02/23/2016   HGB 10.9 (L) 02/23/2016   HCT 34.0 (L) 02/23/2016   MCV 92.4 02/23/2016   PLT 336 02/23/2016      Component Value Date/Time   NA 136 02/23/2016 1339   K 4.6 02/23/2016 1339   CL 98 02/23/2016 1339   CO2 29 02/23/2016 1339   GLUCOSE 161 (H) 02/23/2016 1339   BUN 43 (H) 02/23/2016 1339   CREATININE 1.44 (H) 02/23/2016 1339   CALCIUM 9.7 02/23/2016 1339   PROT 6.8 02/23/2016 1339   ALBUMIN 3.9 02/23/2016 1339   AST 22 02/23/2016 1339   ALT 13 02/23/2016 1339   ALKPHOS 56 02/23/2016 1339   BILITOT 0.5 02/23/2016 1339   GFRNONAA 39 (L) 02/23/2016 1339   GFRAA 45 (L) 02/23/2016 1339   Lab Results  Component Value Date   CHOL 172 06/29/2015   HDL 73 06/29/2015   LDLCALC 82 06/29/2015   TRIG 84 06/29/2015   CHOLHDL 2.4 06/29/2015         ASSESSMENT AND PLAN  Lacunar stroke (HCC) - Plan: Sedimentation rate, C-reactive protein  Brain atrophy  Other fatigue  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   I compared 2015 MRI side by side 2018 MRI today    She has two chronic pontine lacunar infarctions on current scan but the right sided one not seen on 2015 MRI.   Despite the report form 2015, both show  moderately severe cortical atrophy and moderate corpus callosal atrophy.    2.   Armodafinil 125 to 250 mg po qAM for fatigue and sleepiness 3.   Check ESR and CRP.   If elevated, consider temporal artery biopsy. 4.    She will return to see me in 4 months or sooner if there are new or worsening neurologic symptoms.  40 minutes face-to-face evaluation with greater than one time counseling and coordinating care about her abnormal MRI, headaches and fatigue.  Brittney Pace A. Felecia Shelling, MD, PhD 07/31/1163, 79:03 PM Certified in Neurology, Clinical Neurophysiology, Sleep Medicine, Pain Medicine and Neuroimaging  [facility] [address] [place], [place] [address] [phone]

## 2016-05-25 ENCOUNTER — Telehealth: Payer: Self-pay | Admitting: Neurology

## 2016-05-25 LAB — SEDIMENTATION RATE: Sed Rate: 6 mm/hr (ref 0–40)

## 2016-05-25 LAB — C-REACTIVE PROTEIN: CRP: 0.4 mg/L (ref 0.0–4.9)

## 2016-05-25 NOTE — Telephone Encounter (Signed)
Pt said on her appointment on yesterday Dr Felecia Shelling gave [facility] a [facility] [id number] [place] [place] - [address] [phone] (Phone) [phone] (Fax)   )  says a prior authorization is needed , please call

## 2016-05-25 NOTE — Telephone Encounter (Signed)
I have spoken with [facility].  Armodafinil PA apprpoved thru 05/25/17.  PA# [id number]

## 2016-05-26 ENCOUNTER — Telehealth: Payer: Self-pay | Admitting: *Deleted

## 2016-05-26 NOTE — Telephone Encounter (Signed)
-----   Message from Britt Bottom, MD sent at 05/25/2016  6:27 PM EDT ----- Please et her know that the sedimentation rate and the CRP were normal.

## 2016-05-26 NOTE — Telephone Encounter (Signed)
I spoke with Brittney Pace yesterday and made her aware of lab results/fim

## 2016-06-06 ENCOUNTER — Telehealth: Payer: Self-pay | Admitting: Osteopathic Medicine

## 2016-06-06 NOTE — Telephone Encounter (Signed)
Confirming patient wishes for the MOST form. We briefly discussed this at her most recent office visit but she did want some time to go over the paperwork in detail. Patient is very clear on the following: Section A: Do not attempt resuscitation,  Section B: Comfort measures only, no additional interventions.  Section C: Antibiotics: Determine use or limitation when infection occurs.  Section D: IV fluids if indicated and patient would not want a feeding tube placed.  Copy was made to scan to chart, copy was made for pt to give to family member for records. Pt advised keep the original pink for on hand with her DNR form.

## 2016-06-15 ENCOUNTER — Ambulatory Visit: Payer: BLUE CROSS/BLUE SHIELD | Admitting: Osteopathic Medicine

## 2016-07-12 ENCOUNTER — Telehealth: Payer: Self-pay

## 2016-07-12 NOTE — Telephone Encounter (Signed)
Patient called requested a referral for ENT, she stated that she has a history of bad ear infections since she was a kid. This is a patient request please advise if patient would need an appointment.Suanne Marker Cunningham,CMA

## 2016-07-13 NOTE — Telephone Encounter (Signed)
ENT will likely require an office visit with PCP prior to accepting referral - if she has an ENT practice in mind that she would like to see, she can always call them and ask but I typically recommend a visit with me first. If she has an active ear infection now that she is concerned about, she should definitely come see me.

## 2016-07-14 ENCOUNTER — Telehealth: Payer: Self-pay | Admitting: Osteopathic Medicine

## 2016-07-14 ENCOUNTER — Encounter: Payer: Self-pay | Admitting: Osteopathic Medicine

## 2016-07-14 ENCOUNTER — Ambulatory Visit (INDEPENDENT_AMBULATORY_CARE_PROVIDER_SITE_OTHER): Payer: BLUE CROSS/BLUE SHIELD | Admitting: Osteopathic Medicine

## 2016-07-14 VITALS — BP 134/68 | HR 81 | Temp 98.0°F | Wt 94.0 lb

## 2016-07-14 DIAGNOSIS — H938X3 Other specified disorders of ear, bilateral: Secondary | ICD-10-CM | POA: Diagnosis not present

## 2016-07-14 DIAGNOSIS — R5381 Other malaise: Secondary | ICD-10-CM | POA: Diagnosis not present

## 2016-07-14 DIAGNOSIS — I639 Cerebral infarction, unspecified: Secondary | ICD-10-CM

## 2016-07-14 MED ORDER — FLUTICASONE PROPIONATE 50 MCG/ACT NA SUSP: 2.0000 | Freq: Every day | NASAL | 11 refills | Status: DC

## 2016-07-14 NOTE — Progress Notes (Signed)
HPI: Brittney Pace is a 61 y.o. female  who presents to [facility] today, 07/14/16,  for chief complaint of:  Chief Complaint  Patient presents with  . Ear Pain    BILATERAL    Ear pressure on and off throughout her life has pretty much taken a backseat other chronic medical issues. Noticed pressure getting a lot worse particularly on the right side after treatment with home hyperbaric chamber. Pressure seems to be persisting. No vertigo, nausea, fever, chills, jaw pain. She is using the hyperbaric chamber at the recommendation of a Lyme disease specialist in [place] who is also administering low-dose immunotherapy. Previously on Zyrtec but was told to stop this being on the immunotherapy. Not currently on any nasal sprays. Taking Sudafed but this doesn't seem to be helping.   Patient's main concern at this point is whether and ear nose and throat doctor may be able to help her continue the hyperbaric treatments with placement of tubes in the tympanic membranes. She would also like to talk to his specialist about her history of multiple infections as a child which she thinks may cause her chronic ear pressure/popping, mother was a Manufacturing engineer and never took her to doctors, she thinks she may have suffered some damage from this.   Past medical history, surgical history, social history and family history reviewed.  Patient Active Problem List   Diagnosis Date Noted  . Brain atrophy 05/24/2016  . Chronic headache 05/24/2016  . Lacunar stroke ([place]) 01/20/2016  . Memory loss 01/20/2016  . Other fatigue 01/20/2016  . CAD (coronary artery disease) of artery bypass graft 01/20/2016  . Depression 01/20/2016  . Right lateral epicondylitis 12/16/2015  . 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 Lyme disease 12/16/2015  . Hyperlipidemia 09/25/2015  . Myopia of both eyes with astigmatism and presbyopia 05/21/2015  . Vitreous syneresis of both eyes 05/21/2015  .  Combined form of age-related cataract, left eye 05/21/2015  . Surgery, elective 05/06/2015  . CAD (coronary artery disease) 11/19/2014  . Diabetes mellitus type I ([place]) 11/19/2014  . DDD (degenerative disc disease), cervical 03/11/2014  . Vitreous hemorrhage of left eye ([place]) 03/03/2014  . DDD (degenerative disc disease), thoracic 01/07/2014  . Anemia due to stage 3 chronic kidney disease 12/16/2013  . Osteoporosis 11/14/2013  . Chronic diastolic heart failure ([place]) 09/18/2013  . Cataract due to secondary diabetes ([place]) 08/26/2013  . 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 Hypothyroidism due to acquired atrophy of thyroid 08/02/2013  . Cerebral artery occlusion with cerebral infarction ([place]) 07/14/2013  . History of diabetic gastroparesis 04/09/2013  . Irritable bowel syndrome with diarrhea 04/09/2013  . Anemia of chronic disease 04/08/2013  . 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   Current Outpatient Prescriptions on File Prior to Visit  Medication Sig Dispense Refill  . acyclovir (ZOVIRAX) 400 MG tablet Take 1 tablet (400 mg total) by mouth 2 (two) times daily. 180 tablet 3  . Alpha-Lipoic Acid 600 MG CAPS Take 600 mg by mouth 2 (two) times daily.     Marland Kitchen arginine 500 MG tablet Take 500 mg by mouth daily.    . Armodafinil 250 MG tablet Take 1/2 to 1 pill in the mornings 30 tablet 5  . aspirin EC 325 MG tablet Take 325 mg by mouth at bedtime.    . carvedilol (  COREG) 3.125 MG tablet TAKE ONE TABLET BY MOUTH TWICE DAILY WITH MEALS 180 tablet 3  . cetirizine (ZYRTEC) 10 MG tablet Take 10 mg by mouth at bedtime.    . Cholecalciferol (VITAMIN D3) 5000 UNITS TABS Take 1,000 Units by mouth daily.     . Cyanocobalamin (VITAMIN DEFICIENCY SYSTEM-B12) 1000 MCG/ML KIT Inject 1,000 mcg as directed every 3  (three) days.    . diclofenac sodium (VOLTAREN) 1 % GEL Apply 2 g topically 4 (four) times daily. To affected joint. 100 g 11  . Esomeprazole Magnesium (NEXIUM PO) Take 20 mg by mouth every morning.     Marland Kitchen FLUoxetine (PROZAC) 10 MG capsule TAKE ONE CAPSULE BY MOUTH DAILY FOR 2 WEEKS, OPTION TO INCREASE TO 2 CAPSULES DAILY OR STAY ON 1 CAPSULE DAILY- CALL DR Angelgabriel Willmore 60 capsule 1  . hydroxychloroquine (PLAQUENIL) 200 MG tablet Take by mouth daily.    . Insulin Human (INSULIN PUMP) SOLN Inject into the skin continuous. Novolog insulin    . isosorbide mononitrate (IMDUR) 120 MG 24 hr tablet Take 1 tablet (120 mg total) by mouth at bedtime. 90 tablet 3  . levothyroxine (SYNTHROID, LEVOTHROID) 25 MCG tablet Take 25 mcg by mouth daily before breakfast.    . Linoleic Acid Conjugated 1000 MG CAPS Take 1,000 mg by mouth daily.     . Melatonin 500 MCG TBDP Take 500 mcg by mouth at bedtime as needed (sleep).     . Methylsulfonylmethane (MSM) 1000 MG TABS Take by mouth. 1-4 times a day    . nitroGLYCERIN (NITROLINGUAL) 0.4 MG/SPRAY spray Place 1 spray under the tongue every 5 (five) minutes x 3 doses as needed for chest pain. 12 g 1  . NON FORMULARY Chloroella    . OVER THE COUNTER MEDICATION Take 750 mg by mouth at bedtime. GABA    . OVER THE COUNTER MEDICATION as directed. Magnesium gluconate    . OVER THE COUNTER MEDICATION as directed. Omega EPA/DHA    . OVER THE COUNTER MEDICATION as directed. Grape fruit seed extract    . potassium chloride (K-DUR,KLOR-CON) 10 MEQ tablet Take 1 tablet (10 mEq total) by mouth 2 (two) times daily. 180 tablet 3  . torsemide ([name]) 20 MG tablet TAKE ONE TABLET BY MOUTH ONCE DAILY IN THE MORNING.  MAY  TAKE  AN  EXTRA  ONE-HALF  TABLET  AS  NEEDED 135 tablet 3  . vitamin C (ASCORBIC ACID) 500 MG tablet Take 1 tablet (500 mg total) by mouth daily. 50 tablet 0  . VITAMIN K PO Take by mouth as directed.     No current facility-administered medications on file prior to  visit.    Allergies  Allergen Reactions  . Dilaudid [Hydromorphone] Other (See Comments)    Because of continuous glucose monitor  . Morphine Anaphylaxis and Shortness Of Breath  . Morphine And Related Shortness Of Breath  . Penicillins Hives    Has patient had a PCN reaction causing immediate rash, facial/tongue/throat swelling, SOB or lightheadedness with hypotension: Yes Has patient had a PCN reaction causing severe rash involving mucus membranes or skin necrosis: No Has patient had a PCN reaction that required hospitalization No Has patient had a PCN reaction occurring within the last 10 years: No If all of the above answers are "NO", then may proceed with Cephalosporin use.  . Tramadol Anaphylaxis  . Amitriptyline Other (See Comments)    Severe headache/ out of body feeling  . Codeine Other (See Comments)  Severe headaches/ out of body feeling  . Darvon [Propoxyphene] Other (See Comments)    Severe headache Severe headache Severe headaches / out of body feeling  . Other Swelling and Other (See Comments)    Cats itching Mold sinuses Cats itching Mold sinuses Cat hair and scratches cause swelling  . Statins Other (See Comments)    Muscle pains  . Sulfamethoxazole Other (See Comments)  . Acetaminophen Other (See Comments)    Alters insulin pump readings Alters insulin pump readings  . Advil [Ibuprofen] Other (See Comments)    Messes up CGM reading on glucose monitor  . Gluten Meal Diarrhea and Nausea And Vomiting    Cramping Cramping  . Mold Extract [Trichophyton] Other (See Comments)    sinusitis  . Sulfa Antibiotics Other (See Comments)    Mouth ulcers  . Sulfites Other (See Comments)    Mouth ulcers  . Ultracet [Tramadol-Acetaminophen] Other (See Comments)    Small vessel heart attack  . Wheat Bran Diarrhea and Nausea And Vomiting    Cramping Cramping      Review of Systems:  HEENT: No new headache, no vision change. Ear pressure complaints as per  history of present illness  Cardiac: No  chest pain, No  pressure, No palpitations  Respiratory:  No  shortness of breath. No  Cough  Gastrointestinal: No  abdominal pain  Musculoskeletal: No new myalgia/arthralgia  Skin: No  Rash  Hem/Onc: No  easy bruising/bleeding, No  abnormal lumps/bumps  Neurologic: No  weakness, No  Dizziness  Psychiatric: No  concerns with depression, No  concerns with anxiety  Exam:  BP 134/68   Pulse 81   Temp 98 F (36.7 C) (Oral)   Wt 94 lb (42.6 kg)   BMI 18.99 kg/m   Constitutional: VS see above. General Appearance: alert, well-developed, well-nourished, NAD  Eyes: Normal lids and conjunctive, non-icteric sclera  Ears, Nose, Mouth, Throat: MMM, Normal external inspection ears/nares/mouth/lips/gums. Normal tissue oropharynx. Tympanic membranes bilaterally clear without erythema, some mild clear effusion behind left tympanic membrane. No hemotympanum.  Neck: No masses, trachea midline.   Respiratory: Normal respiratory effort.   Musculoskeletal: Gait normal. Symmetric and independent movement of all extremities  Neurological: Normal balance/coordination. +tremor.  Skin: warm, dry, intact.   Psychiatric: Normal judgment/insight. Normal mood and affect. Oriented x3.      ASSESSMENT/PLAN:   Pressure sensation in both ears - Plan: fluticasone (FLONASE) 50 MCG/ACT nasal spray, Ambulatory referral to ENT, Hearing screening  Physical debility - Patient requesting paperwork be filled out for ride assistance due to vision problems, weakness/tremor, coordination issues   Advised that I'm not sure placement of tympanostomy tubes is the answer here, I am doubtful that a surgeon would be willing to pursue this on an elective basis so that she could continue hyperbaric treatments, or whether such a procedure would even make a difference in her case. I think trying to treat likely eustachian tube dysfunction with Flonase is reasonable, can add  saline washes and continue Sudafed. I'm not sure the reason that her Lyme disease specialist would have discontinued Zyrtec but would check with him before restarting this, I think it's probably reasonable to do so.   Multiple medical comorbidities, patient is understandably very hopeful that the hyperbaric oxygen will make a difference to her chronic medical conditions, she is particularly concerned about brain atrophy, circulatory complications from type 1 diabetes and congestive heart failure with coronary artery disease, chronic fatigue.  Patient Instructions  Plan:  Try nasal  spray plus or minus saline washes, Flonase. Would check with your Lyme disease specialist with regard to whether getting back on Zyrtec is advised.  We'll go ahead and place referral to ENT, they may have something else to add    Follow-up plan: Return if symptoms worsen or fail to improve.  Visit summary with medication list and pertinent instructions was printed for patient to review, alert [place] if any changes needed. All questions at time of visit were answered - patient instructed to contact office with any additional concerns. ER/RTC precautions were reviewed with the patient and understanding verbalized.   Note: Total time spent 25 minutes, greater than 50% of the visit was spent face-to-face counseling and coordinating care for the following: The primary encounter diagnosis was Pressure sensation in both ears. A diagnosis of Physical debility was also pertinent to this visit.Marland Kitchen

## 2016-07-14 NOTE — Telephone Encounter (Signed)
Dr. Sheppard Coil   I have patient scheduled with [facility] ENT and they are requiring a hearing test and need [place] to fax an order for the test. Could you please place the order and I will get it faxed over to them.   Thank you  cindy

## 2016-07-14 NOTE — Telephone Encounter (Signed)
It's on your desk. I think it's the correct order? If not, let me know!

## 2016-07-14 NOTE — Telephone Encounter (Signed)
LEFT DETAILED MESSAGE ON PATIENT VM WITH [employer]VISE AS NOTED BELOW. Rhonda Cunningham,CMA

## 2016-07-14 NOTE — Patient Instructions (Signed)
Plan:  Try nasal spray plus or minus saline washes, Flonase. Would check with your Lyme disease specialist with regard to whether getting back on Zyrtec is advised.  We'll go ahead and place referral to ENT, they may have something else to add

## 2016-07-15 ENCOUNTER — Telehealth: Payer: Self-pay | Admitting: Osteopathic Medicine

## 2016-07-15 NOTE — Telephone Encounter (Signed)
LVM telling pt that her form was ready for pick up.

## 2016-07-15 NOTE — Telephone Encounter (Signed)
Please call patient: forms are up front for her to pick up - sorry for the delay but things got very busy yesterday afternoon and I was unable to complete them until now. Thanks for patience!

## 2016-08-09 ENCOUNTER — Ambulatory Visit (INDEPENDENT_AMBULATORY_CARE_PROVIDER_SITE_OTHER): Payer: BLUE CROSS/BLUE SHIELD | Admitting: Osteopathic Medicine

## 2016-08-09 ENCOUNTER — Telehealth: Payer: Self-pay | Admitting: *Deleted

## 2016-08-09 VITALS — BP 133/66 | HR 62 | Wt 93.0 lb

## 2016-08-09 DIAGNOSIS — E1059 Type 1 diabetes mellitus with other circulatory complications: Secondary | ICD-10-CM

## 2016-08-09 DIAGNOSIS — Z8639 Personal history of other endocrine, nutritional and metabolic disease: Secondary | ICD-10-CM

## 2016-08-09 DIAGNOSIS — Z8673 Personal history of transient ischemic attack (TIA), and cerebral infarction without residual deficits: Secondary | ICD-10-CM | POA: Diagnosis not present

## 2016-08-09 DIAGNOSIS — K580 Irritable bowel syndrome with diarrhea: Secondary | ICD-10-CM | POA: Diagnosis not present

## 2016-08-09 DIAGNOSIS — H938X3 Other specified disorders of ear, bilateral: Secondary | ICD-10-CM | POA: Diagnosis not present

## 2016-08-09 DIAGNOSIS — I639 Cerebral infarction, unspecified: Secondary | ICD-10-CM

## 2016-08-09 MED ORDER — PROMETHAZINE HCL 25 MG PO TABS: 25.0000 mg | ORAL_TABLET | Freq: Four times a day (QID) | ORAL | 2 refills | Status: DC | PRN

## 2016-08-09 NOTE — Progress Notes (Signed)
HPI: Brittney Pace is a 61 y.o. female  who presents to [facility] today, 08/09/16,  for chief complaint of:  Chief Complaint  Patient presents with  . Nausea    History of diabetic gastroparesis & Irritable bowel syndrome with diarrhea following with GI. Severe nausea w/ some vomiting lately. Saw PA at GI office who started peppermint oil. Previously on Reglan but could not tolerate this medication   Doesn't want to go back to her neurologist - was following for lacunar stroke and brain atrophy, among other issues. She requests me to refill her modafinil from me when she needs this to tie her over until she can see someone else. Doesn't need refill yet.   Type 1 diabetes mellitus with other circulatory complication following with Endocrine. Sugars okay lately per patient.   Pressure sensation in both ears - hyperbaric O2 use at home off-label for tx chronic lyme disease syndrome. Following with ENT who are planning for tympanostomy Still having R ear pain. Following with TMJ specialist next week so held off on seeing ENT for the tubes. Lyme specialist has placed her on plaquenil and B12.   Goals for today: requests "atronger nausea medicine" and something other than Sudafed to take for the ear fullness.     Past medical history, surgical history, social history and family history reviewed.    Current medication list and allergy/intolerance information reviewed.   Current Outpatient Prescriptions on File Prior to Visit  Medication Sig Dispense Refill  . acyclovir (ZOVIRAX) 400 MG tablet Take 1 tablet (400 mg total) by mouth 2 (two) times daily. 180 tablet 3  . Alpha-Lipoic Acid 600 MG CAPS Take 600 mg by mouth 2 (two) times daily.     Marland Kitchen arginine 500 MG tablet Take 500 mg by mouth daily.    . Armodafinil 250 MG tablet Take 1/2 to 1 pill in the mornings 30 tablet 5  . aspirin EC 325 MG tablet Take 325 mg by mouth at bedtime.    . carvedilol (COREG)  3.125 MG tablet TAKE ONE TABLET BY MOUTH TWICE DAILY WITH MEALS 180 tablet 3  . cetirizine (ZYRTEC) 10 MG tablet Take 10 mg by mouth at bedtime.    . Cholecalciferol (VITAMIN D3) 5000 UNITS TABS Take 1,000 Units by mouth daily.     . Cyanocobalamin (VITAMIN DEFICIENCY SYSTEM-B12) 1000 MCG/ML KIT Inject 1,000 mcg as directed every 3 (three) days.    . diclofenac sodium (VOLTAREN) 1 % GEL Apply 2 g topically 4 (four) times daily. To affected joint. 100 g 11  . Esomeprazole Magnesium (NEXIUM PO) Take 20 mg by mouth every morning.     Marland Kitchen FLUoxetine (PROZAC) 10 MG capsule TAKE ONE CAPSULE BY MOUTH DAILY FOR 2 WEEKS, OPTION TO INCREASE TO 2 CAPSULES DAILY OR STAY ON 1 CAPSULE DAILY- CALL DR Starr Urias 60 capsule 1  . fluticasone (FLONASE) 50 MCG/ACT nasal spray Place 2 sprays into both nostrils daily. 16 g 11  . hydroxychloroquine (PLAQUENIL) 200 MG tablet Take by mouth daily.    . Insulin Human (INSULIN PUMP) SOLN Inject into the skin continuous. Novolog insulin    . isosorbide mononitrate (IMDUR) 120 MG 24 hr tablet Take 1 tablet (120 mg total) by mouth at bedtime. 90 tablet 3  . levothyroxine (SYNTHROID, LEVOTHROID) 25 MCG tablet Take 25 mcg by mouth daily before breakfast.    . Linoleic Acid Conjugated 1000 MG CAPS Take 1,000 mg by mouth daily.     Marland Kitchen  Melatonin 500 MCG TBDP Take 500 mcg by mouth at bedtime as needed (sleep).     . Methylsulfonylmethane (MSM) 1000 MG TABS Take by mouth. 1-4 times a day    . nitroGLYCERIN (NITROLINGUAL) 0.4 MG/SPRAY spray Place 1 spray under the tongue every 5 (five) minutes x 3 doses as needed for chest pain. 12 g 1  . NON FORMULARY Chloroella    . OVER THE COUNTER MEDICATION Take 750 mg by mouth at bedtime. GABA    . OVER THE COUNTER MEDICATION as directed. Magnesium gluconate    . OVER THE COUNTER MEDICATION as directed. Omega EPA/DHA    . OVER THE COUNTER MEDICATION as directed. Grape fruit seed extract    . potassium chloride (K-DUR,KLOR-CON) 10 MEQ tablet Take 1  tablet (10 mEq total) by mouth 2 (two) times daily. 180 tablet 3  . torsemide ([name]) 20 MG tablet TAKE ONE TABLET BY MOUTH ONCE DAILY IN THE MORNING.  MAY  TAKE  AN  EXTRA  ONE-HALF  TABLET  AS  NEEDED 135 tablet 3  . vitamin C (ASCORBIC ACID) 500 MG tablet Take 1 tablet (500 mg total) by mouth daily. 50 tablet 0  . VITAMIN K PO Take by mouth as directed.     No current facility-administered medications on file prior to visit.        Review of Systems:  Constitutional: No new recent illness  HEENT: No  headache, no vision change, +ear pressure as per HPI   Cardiac: No  chest pain, No  pressure, No palpitations   Respiratory:  No  shortness of breath. No  Cough  Gastrointestinal: No  abdominal pain, no change on bowel habits, +nausea/vomiting as per HPI   Musculoskeletal: No new myalgia/arthralgia  Skin: No  Rash  Neurologic: +chronic generalized weakness, No  Dizziness   Exam:  BP 133/66   Pulse 62   Wt 93 lb (42.2 kg)   SpO2 100%   BMI 18.78 kg/m   Constitutional: VS see above. General Appearance: alert, well-developed, well-nourished, NAD  Eyes: Normal lids and conjunctive, non-icteric sclera  Ears, Nose, Mouth, Throat: MMM, Normal external inspection ears/nares/mouth/lips/gums.  Neck: No masses, trachea midline.   Respiratory: Normal respiratory effort. no wheeze, no rhonchi, no rales  Cardiovascular: S1/S2 normal, no murmur, no rub/gallop auscultated. RRR.   Musculoskeletal: Gait normal. Symmetric and independent movement of all extremities  Neurological: Normal balance/coordination. No tremor.  Skin: warm, dry, intact.   Psychiatric: Normal judgment/insight. Normal mood and affect. Oriented x3.      ASSESSMENT/PLAN:   History of diabetic gastroparesis - consider reevaluation with GI, sent phenergan, declined reglan   Irritable bowel syndrome with diarrhea - stable, nausea is biggest issue at this point   Type 1 diabetes mellitus with other  circulatory complication (HCC) - following with endocrinology  Pressure sensation in both ears - Advised ass FLonase + Sudafed, discuss w/ ENT but nothing may make this better until she gets tubes or stops the hyperbaric O2   History of lacunar cerebrovascular accident (CVA) - requests referral to different neurologist  - Plan: Ambulatory referral to Neurology    Patient Instructions  Plan:  Follow-up ENT to discuss tubes in the ears  Can take Sudafed in addition to Flonase but I think tubes in the ears will be the answer  You should hear back about a new neruologist with a week     Follow-up plan: Return if symptoms worsen or fail to improve.  Visit summary with  medication list and pertinent instructions was printed for patient to review, alert [place] if any changes needed. All questions at time of visit were answered - patient instructed to contact office with any additional concerns. ER/RTC precautions were reviewed with the patient and understanding verbalized.

## 2016-08-09 NOTE — Patient Instructions (Addendum)
Plan:  Follow-up ENT to discuss tubes in the ears  Can take Sudafed in addition to Flonase but I think tubes in the ears will be the answer  You should hear back about a new neruologist with a week

## 2016-08-09 NOTE — Telephone Encounter (Signed)
Forms completed, copied, scanned, faxed, conformation received.Brittney Pace [place]

## 2016-08-22 ENCOUNTER — Telehealth: Payer: Self-pay | Admitting: Osteopathic Medicine

## 2016-08-22 DIAGNOSIS — R21 Rash and other nonspecific skin eruption: Secondary | ICD-10-CM

## 2016-08-22 NOTE — Telephone Encounter (Signed)
Called pt to adv that referral will be placed and she adv doesn't not want to go to [facility].

## 2016-08-22 NOTE — Telephone Encounter (Signed)
Patient called requesting to get a referral to a dermatologist for a bad rash on her leg that is spreading. 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 Pt states her insurance doesn't not require a referral she has great coverage. Told pt will send a message. Please Adv. Thanks

## 2016-08-22 NOTE — Telephone Encounter (Signed)
Okay to send referral to dermatologist for diagnosis unspecified rash.  If patient needs acute visit, she can come see me or urgent care next door.

## 2016-08-26 ENCOUNTER — Emergency Department (INDEPENDENT_AMBULATORY_CARE_PROVIDER_SITE_OTHER)
Admission: EM | Admit: 2016-08-26 | Discharge: 2016-08-26 | Disposition: A | Discharge status: Home / Self Care | Payer: BLUE CROSS/BLUE SHIELD | Source: Home / Self Care | Attending: Family Medicine | Admitting: Family Medicine

## 2016-08-26 ENCOUNTER — Encounter: Payer: Self-pay | Admitting: Emergency Medicine

## 2016-08-26 ENCOUNTER — Emergency Department (INDEPENDENT_AMBULATORY_CARE_PROVIDER_SITE_OTHER): Payer: BLUE CROSS/BLUE SHIELD

## 2016-08-26 DIAGNOSIS — R21 Rash and other nonspecific skin eruption: Secondary | ICD-10-CM

## 2016-08-26 MED ORDER — MUPIROCIN CALCIUM 2 % EX CREA: 1.0000 "application " | TOPICAL_CREAM | Freq: Three times a day (TID) | CUTANEOUS | 0 refills | Status: DC

## 2016-08-26 NOTE — ED Provider Notes (Signed)
[facility]    CSN: [id number] Arrival date & time: 08/26/16  5621     History   Chief Complaint Chief Complaint  Patient presents with  . Rash    HPI Brittney Pace is a 61 y.o. female.   Patient complains of a persistent pruritic rash on her left lower anterior leg for about 5 weeks.  The rash started with several erythematous macules, with appearance of more lesions later.  During the past 3 weeks she has had low grade fever/chills.  During the past week, she states that her bone (in leg) hurts.  She recalls no preceding sore throat, insect bites, plant contact, or other symptoms. She reports that she has an appointment with dermatologist in 3 days.   The history is provided by the patient and the spouse.  Rash  Location: left lower leg. Quality: draining, itchiness, painful, redness and weeping   Quality: not blistering, not bruising, not burning, not peeling, not scaling and not swelling   Pain details:    Quality:  Aching   Severity:  Moderate   Onset quality:  Gradual   Duration:  5 weeks   Timing:  Constant   Progression:  Worsening Severity:  Moderate Onset quality:  Gradual Duration:  5 weeks Timing:  Constant Progression:  Worsening Chronicity:  New Context: not animal contact, not chemical exposure, not exposure to similar rash, not food, not hot tub use, not insect bite/sting, not medications, not new detergent/soap, not nuts, not plant contact, not pollen and not sick contacts   Relieved by:  Nothing Worsened by:  Nothing Ineffective treatments:  Anti-fungal cream Associated symptoms: fever   Associated symptoms: no abdominal pain, no diarrhea, no fatigue, no headaches, no hoarse voice, no induration, no joint pain, no myalgias, no nausea, no periorbital edema, no shortness of breath, no sore throat, no throat swelling, no tongue swelling, no URI, not vomiting and not wheezing     Past Medical History:  Diagnosis Date  . Anemia   .  Arthritis   . 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 Chronic kidney disease    stage 3  . Coronary artery disease   . Depression   . Diabetes mellitus without complication (HCC)    Type 1  . Diabetic retinopathy ([place])   . Family history of adverse reaction to anesthesia    mother: " while she was under she stopped breathing."  . Fibromyalgia   . Gastroparesis   . GERD (gastroesophageal reflux disease)   . Headache    migraines  . Hypothyroidism   . IBS (irritable bowel syndrome)   . Idiopathic edema   . Lyme disease   . Mitral valve prolapse   . Myocardial infarction ([place])    1 major in 1999 and 2 minor " small vessel disease."  . Osteoporosis   . Peripheral neuropathy   . Peripheral vascular disease ([place])   . 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 Stroke [facility])    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" " the second stroke was a lacunar     Patient Active Problem List   Diagnosis Date Noted  . Brain atrophy 05/24/2016  . Chronic headache 05/24/2016  . Lacunar stroke ([place]) 01/20/2016  . Memory loss 01/20/2016  . Other fatigue 01/20/2016  . CAD (coronary artery disease) of artery bypass graft 01/20/2016  . Depression 01/20/2016  . Right lateral epicondylitis 12/16/2015  . Babesiosis  12/16/2015  . Lyme disease 12/16/2015  . Hyperlipidemia 09/25/2015  . Myopia of both eyes with astigmatism and presbyopia 05/21/2015  . Vitreous syneresis of both eyes 05/21/2015  . Combined form of age-related cataract, left eye 05/21/2015  . Surgery, elective 05/06/2015  . CAD (coronary artery disease) 11/19/2014  . Diabetes mellitus type I ([place]) 11/19/2014  . DDD (degenerative disc disease), cervical 03/11/2014  . Vitreous hemorrhage of left eye ([place]) 03/03/2014  . DDD (degenerative disc disease), thoracic 01/07/2014  . Anemia due to stage 3 chronic kidney disease 12/16/2013  . Osteoporosis 11/14/2013  . Chronic diastolic heart failure ([place]) 09/18/2013  .  Cataract due to secondary diabetes ([place]) 08/26/2013  . 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 associated with type 2 diabetes mellitus ([place]) 08/26/2013  . Hypothyroidism due to acquired atrophy of thyroid 08/02/2013  . Cerebral artery occlusion with cerebral infarction ([place]) 07/14/2013  . History of diabetic gastroparesis 04/09/2013  . Irritable bowel syndrome with diarrhea 04/09/2013  . Anemia of chronic disease 04/08/2013  . 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 ABDOMINAL HYSTERECTOMY    . APPENDECTOMY    . BREAST SURGERY     B/L biopsy and lumpectomy   . CARDIAC CATHETERIZATION    . CARPAL TUNNEL RELEASE    . CATARACT EXTRACTION W/ INTRAOCULAR LENS IMPLANT     right eye  . COLONOSCOPY W/ BIOPSIES AND POLYPECTOMY    . CORONARY ARTERY BYPASS GRAFT    . coronary artery stents     at [place] and [place]  . ECTOPIC PREGNANCY SURGERY    . NASAL SEPTUM SURGERY    . OPEN REDUCTION INTERNAL FIXATION (ORIF) DISTAL RADIAL FRACTURE Right 05/06/2015   Procedure: OPEN REDUCTION INTERNAL FIXATION (ORIF) RIGHT DISTAL RADIAL FRACTURE AND REPAIRS AS NEEDED;  Surgeon: Iran Planas, MD;  Location: [facility];  Service: Orthopedics;  Laterality: Right;  . ORIF WRIST FRACTURE Left 11/05/2014  . ORIF WRIST FRACTURE Left 11/05/2014   Procedure: OPEN REDUCTION INTERNAL FIXATION (ORIF) LEFT WRIST FRACTURE AND REPAIR AS INDICATED;  Surgeon: Iran Planas, MD;  Location: [facility];  Service: Orthopedics;  Laterality: Left;  . TRIGGER FINGER RELEASE      OB History    No data available       Home Medications    Prior to Admission medications   Medication Sig Start Date End Date Taking? Authorizing Provider  acyclovir (ZOVIRAX) 400 MG tablet Take 1 tablet (400 mg total) by mouth 2 (two) times daily. 12/16/15   Emeterio Reeve, DO  Alpha-Lipoic Acid 600 MG CAPS Take 600 mg by mouth 2 (two) times  daily.     [provider]  arginine 500 MG tablet Take 500 mg by mouth daily.    [provider]  Armodafinil 250 MG tablet Take 1/2 to 1 pill in the mornings 05/24/16   Sater, Nanine Means, MD  aspirin EC 325 MG tablet Take 325 mg by mouth at bedtime.    [provider]  carvedilol (COREG) 3.125 MG tablet TAKE ONE TABLET BY MOUTH TWICE DAILY WITH MEALS 10/26/15   Lelon Perla, MD  cetirizine (ZYRTEC) 10 MG tablet Take 10 mg by mouth at bedtime.    [provider]  Cholecalciferol (VITAMIN D3) 5000 UNITS TABS Take 1,000 Units by mouth daily.     [provider]  Cyanocobalamin (VITAMIN DEFICIENCY SYSTEM-B12) 1000 MCG/ML KIT Inject 1,000 mcg  as directed every 3 (three) days.    [provider]  diclofenac sodium (VOLTAREN) 1 % GEL Apply 2 g topically 4 (four) times daily. To affected joint. 12/16/15   Gregor Hams, MD  Esomeprazole Magnesium (NEXIUM PO) Take 20 mg by mouth every morning.     [provider]  fluticasone (FLONASE) 50 MCG/ACT nasal spray Place 2 sprays into both nostrils daily. 07/14/16   Emeterio Reeve, DO  Insulin Human (INSULIN PUMP) SOLN Inject into the skin continuous. Novolog insulin    [provider]  isosorbide mononitrate (IMDUR) 120 MG 24 hr tablet Take 1 tablet (120 mg total) by mouth at bedtime. 12/16/15   Emeterio Reeve, DO  levothyroxine (SYNTHROID, LEVOTHROID) 25 MCG tablet Take 25 mcg by mouth daily before breakfast.    [provider]  Linoleic Acid Conjugated 1000 MG CAPS Take 1,000 mg by mouth daily.     [provider]  Methylsulfonylmethane (MSM) 1000 MG TABS Take by mouth. 1-4 times a day    [provider]  mupirocin cream (BACTROBAN) 2 % Apply 1 application topically 3 (three) times daily. 08/26/16   Kandra Nicolas, MD  NATURE-THROID 32.5 MG tablet 32.5 mg. 08/04/16   [provider]  nitroGLYCERIN (NITROLINGUAL) 0.4 MG/SPRAY spray Place 1 spray  under the tongue every 5 (five) minutes x 3 doses as needed for chest pain. 01/02/15   Lelon Perla, MD  NON FORMULARY Chloroella    [provider]  OVER THE COUNTER MEDICATION Take 750 mg by mouth at bedtime. GABA    [provider]  OVER THE COUNTER MEDICATION as directed. Magnesium gluconate    [provider]  OVER THE COUNTER MEDICATION as directed. Omega EPA/DHA    [provider]  OVER THE COUNTER MEDICATION as directed. Grape fruit seed extract    [provider]  potassium chloride (K-DUR,KLOR-CON) 10 MEQ tablet Take 1 tablet (10 mEq total) by mouth 2 (two) times daily. 12/16/15   Emeterio Reeve, DO  promethazine (PHENERGAN) 25 MG tablet Take 1 tablet (25 mg total) by mouth every 6 (six) hours as needed for nausea or vomiting. 08/09/16   Emeterio Reeve, DO  torsemide ([name]) 20 MG tablet TAKE ONE TABLET BY MOUTH ONCE DAILY IN THE MORNING.  MAY  TAKE  AN  EXTRA  ONE-HALF  TABLET  AS  NEEDED 12/16/15   Emeterio Reeve, DO  vitamin C (ASCORBIC ACID) 500 MG tablet Take 1 tablet (500 mg total) by mouth daily. 05/06/15   Iran Planas, MD  VITAMIN K PO Take by mouth as directed.    [provider]    Family History Family History  Problem Relation Age of Onset  . Breast cancer Mother   . Heart disease Father   . Hypertension Father   . Breast cancer Sister     Social History Social History  Substance Use Topics  . Smoking status: Former Smoker    Types: Cigarettes  . Smokeless tobacco: Never Used     Comment:  " Quit smoking cigarettes in 20's "  . Alcohol use Yes     Comment: occasional beer or wine     Allergies   Dilaudid [hydromorphone]; Morphine; Morphine and related; Penicillins; Tramadol; Amitriptyline; Codeine; Darvon [propoxyphene]; Other; Statins; Sulfamethoxazole; Acetaminophen; Advil [ibuprofen]; Gluten meal; Mold extract [trichophyton]; Sulfa antibiotics; Sulfites; Ultracet  [tramadol-acetaminophen]; and Wheat bran   Review of Systems Review of Systems  Constitutional: Positive for fever. Negative for fatigue.  HENT: Negative for hoarse voice and sore throat.   Respiratory: Negative for shortness of breath and wheezing.   Gastrointestinal: Negative for abdominal pain, diarrhea, nausea and vomiting.  Musculoskeletal: Negative for arthralgias and myalgias.  Skin: Positive for rash.  Neurological: Negative for headaches.  All other systems reviewed and are negative.    Physical Exam Triage Vital Signs ED Triage Vitals  Enc Vitals Group     BP 08/26/16 0845 (!) 142/77     Pulse Rate 08/26/16 0845 64     Resp --      Temp 08/26/16 0845 (!) 97.5 F (36.4 C)     Temp Source 08/26/16 0845 Oral     SpO2 08/26/16 0845 99 %     Weight 08/26/16 0846 94 lb (42.6 kg)     Height 08/26/16 0846 4' 11" (1.499 m)     Head Circumference --      Peak Flow --      Pain Score 08/26/16 0846 5     Pain Loc --      Pain Edu? --      Excl. in [place]? --    No data found.   Updated Vital Signs BP (!) 142/77 (BP Location: Left Arm)   Pulse 64   Temp (!) 97.5 F (36.4 C) (Oral)   Ht 4' 11" (1.499 m)   Wt 94 lb (42.6 kg)   SpO2 99%   BMI 18.99 kg/m   Visual Acuity Right Eye Distance:   Left Eye Distance:   Bilateral Distance:    Right Eye Near:   Left Eye Near:    Bilateral Near:     Physical Exam  Constitutional: She appears well-developed and well-nourished. No distress.  HENT:  Head: Normocephalic.  Right Ear: External ear normal.  Left Ear: External ear normal.  Nose: Nose normal.  Mouth/Throat: Oropharynx is clear and moist.  Eyes: Pupils are equal, round, and reactive to light.  Neck: Neck supple.  Cardiovascular: Normal heart sounds.   Pulmonary/Chest: Breath sounds normal.  Abdominal: There is no tenderness.  Musculoskeletal: She exhibits no edema.       Left lower leg: She exhibits tenderness. She exhibits no edema.       Legs: Left lower  leg pre-tibial area as scattered erythematous macules about 57m to 166mdiameter, non-blanching, some with central excoriations.  Pre-tibial area is mildly tender to palpation.  Lymphadenopathy:    She has no cervical adenopathy.  Neurological: She is alert.  Skin: Skin is warm and dry.  Nursing note and vitals reviewed.    UC Treatments / Results  Labs (all labs ordered are listed, but only abnormal results are displayed) Labs Reviewed  WOUND CULTURE  POCT CBC W AUTO DIFF ([place]  WBC 5.9; LY 29.2; MO 7.6; GR 63.2; Hgb 11.6; Platelets 423    Review of previous records reveal normal C-reactive protein and Sed rate done on 05/24/16.  CBC done 02/23/16 showed WBC 6.3 and Hgb 10.9 EKG  EKG Interpretation None       Radiology Dg Tibia/fibula Left  Result Date: 08/26/2016 CLINICAL DATA:  Rash on left lower leg EXAM: LEFT TIBIA AND FIBULA - 2 VIEW COMPARISON:  None. FINDINGS: Diffuse vascular calcifications. No acute bony abnormality. Specifically, no fracture, subluxation, or dislocation. Soft tissues are intact. IMPRESSION: No acute bony abnormality. Electronically Signed   By: KeRolm Baptise.D.   On: 08/26/2016 09:52    Procedures Procedures (including critical care time)  Medications  Ordered in UC Medications - No data to display   Initial Impression / Assessment and Plan / UC Course  I have reviewed the triage vital signs and the nursing notes.  Pertinent labs & imaging results that were available during my care of the patient were reviewed by me and considered in my medical decision making (see chart for details).    Suspect that original condition 5 weeks ago was erythema nodosum; now appears to have secondary bacterial infection.  Wound culture pending. Patient has multiple antibiotic allergies and intolerances.  Will begin empiric mupirocin cream TID while culture pending. She has an appointment with dermatologist in 3 days.  Advised her to followup with her  dermatologist; may need biopsy.    Final Clinical Impressions(s) / UC Diagnoses   Final diagnoses:  Rash and nonspecific skin eruption    New Prescriptions New Prescriptions   MUPIROCIN CREAM (BACTROBAN) 2 %    Apply 1 application topically 3 (three) times daily.     Kandra Nicolas, MD 08/26/16 1137

## 2016-08-26 NOTE — ED Triage Notes (Signed)
Red, itchy, painful rash on left leg x 1 month.

## 2016-08-29 ENCOUNTER — Telehealth: Payer: Self-pay

## 2016-08-29 LAB — WOUND CULTURE
Gram Stain: NONE SEEN
Gram Stain: NONE SEEN
Organism ID, Bacteria: NO GROWTH

## 2016-08-29 LAB — POCT CBC W AUTO DIFF (K'VILLE URGENT CARE)

## 2016-08-29 NOTE — Telephone Encounter (Signed)
Called patient with lab results.  Is following up with dermatologist.

## 2016-09-01 NOTE — Addendum Note (Signed)
Addended by: Maryla Morrow on: 09/01/2016 02:35 PM   Modules accepted: Orders

## 2016-09-01 NOTE — Telephone Encounter (Signed)
Please Place referral

## 2016-09-28 ENCOUNTER — Ambulatory Visit: Payer: Medicare Other | Admitting: Neurology

## 2016-10-12 ENCOUNTER — Encounter: Payer: Self-pay | Admitting: Osteopathic Medicine

## 2016-10-12 ENCOUNTER — Ambulatory Visit (INDEPENDENT_AMBULATORY_CARE_PROVIDER_SITE_OTHER): Payer: BLUE CROSS/BLUE SHIELD

## 2016-10-12 ENCOUNTER — Ambulatory Visit (INDEPENDENT_AMBULATORY_CARE_PROVIDER_SITE_OTHER): Payer: BLUE CROSS/BLUE SHIELD | Admitting: Osteopathic Medicine

## 2016-10-12 VITALS — BP 152/78 | HR 64 | Ht 59.0 in | Wt 95.0 lb

## 2016-10-12 DIAGNOSIS — M4312 Spondylolisthesis, cervical region: Secondary | ICD-10-CM

## 2016-10-12 DIAGNOSIS — M5031 Other cervical disc degeneration,  high cervical region: Secondary | ICD-10-CM

## 2016-10-12 DIAGNOSIS — I639 Cerebral infarction, unspecified: Secondary | ICD-10-CM

## 2016-10-12 DIAGNOSIS — M542 Cervicalgia: Secondary | ICD-10-CM | POA: Diagnosis not present

## 2016-10-12 MED ORDER — CYCLOBENZAPRINE HCL 10 MG PO TABS: 5.0000 mg | ORAL_TABLET | Freq: Three times a day (TID) | ORAL | 1 refills | Status: DC | PRN

## 2016-10-12 MED ORDER — DICLOFENAC SODIUM 1 % TD GEL: 2.0000 g | Freq: Four times a day (QID) | TRANSDERMAL | 3 refills | Status: DC

## 2016-10-12 NOTE — Patient Instructions (Signed)
Plan:  Medications as below: muscle relaxer cyclobenzaprine and anti-inflammatory voltaren gel   TENS unit use if you can find it at home  Physical therapy if no better - call us tomorrow or the day after if no better

## 2016-10-12 NOTE — Progress Notes (Signed)
HPI: Brittney Pace is a 61 y.o. female  who presents to [facility] today, 10/12/16,  for chief complaint of:  Chief Complaint  Patient presents with  . Neck Pain     . Context: no injury, hx arthritis and disc disease in C spine . Location: R neck/shoulder (trap/rhomboid area) . Quality: sore, stiff . Severity/Duration: almost a month and getting worse . Timing: constant but worse with turning head especially to R . Assoc signs/symptoms: shooting R arm pain on occasion    Past medical history, surgical history, social history and family history reviewed.  Patient Active Problem List   Diagnosis Date Noted  . Brain atrophy 05/24/2016  . Chronic headache 05/24/2016  . Lacunar stroke ([place]) 01/20/2016  . Memory loss 01/20/2016  . Other fatigue 01/20/2016  . CAD (coronary artery disease) of artery bypass graft 01/20/2016  . Depression 01/20/2016  . Right lateral epicondylitis 12/16/2015  . 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 Lyme disease 12/16/2015  . Hyperlipidemia 09/25/2015  . Myopia of both eyes with astigmatism and presbyopia 05/21/2015  . Vitreous syneresis of both eyes 05/21/2015  . Combined form of age-related cataract, left eye 05/21/2015  . Surgery, elective 05/06/2015  . CAD (coronary artery disease) 11/19/2014  . Diabetes mellitus type I ([place]) 11/19/2014  . DDD (degenerative disc disease), cervical 03/11/2014  . Vitreous hemorrhage of left eye ([place]) 03/03/2014  . DDD (degenerative disc disease), thoracic 01/07/2014  . Anemia due to stage 3 chronic kidney disease 12/16/2013  . Osteoporosis 11/14/2013  . Chronic diastolic heart failure ([place]) 09/18/2013  . Cataract due to secondary diabetes ([place]) 08/26/2013  . 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 associated with type 2 diabetes mellitus ([place]) 08/26/2013  . Hypothyroidism due to acquired atrophy of thyroid 08/02/2013  . Cerebral artery occlusion with  cerebral infarction ([place]) 07/14/2013  . History of diabetic gastroparesis 04/09/2013  . Irritable bowel syndrome with diarrhea 04/09/2013  . Anemia of chronic disease 04/08/2013  . 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   Current Outpatient Prescriptions on File Prior to Visit  Medication Sig Dispense Refill  . acyclovir (ZOVIRAX) 400 MG tablet Take 1 tablet (400 mg total) by mouth 2 (two) times daily. 180 tablet 3  . Alpha-Lipoic Acid 600 MG CAPS Take 600 mg by mouth 2 (two) times daily.     Marland Kitchen arginine 500 MG tablet Take 500 mg by mouth daily.    . Armodafinil 250 MG tablet Take 1/2 to 1 pill in the mornings 30 tablet 5  . aspirin EC 325 MG tablet Take 325 mg by mouth at bedtime.    . carvedilol (COREG) 3.125 MG tablet TAKE ONE TABLET BY MOUTH TWICE DAILY WITH MEALS 180 tablet 3  . cetirizine (ZYRTEC) 10 MG tablet Take 10 mg by mouth at bedtime.    . Cholecalciferol (VITAMIN D3) 5000 UNITS TABS Take 1,000 Units by mouth daily.     . Cyanocobalamin (VITAMIN DEFICIENCY SYSTEM-B12) 1000 MCG/ML KIT Inject 1,000 mcg as directed every 3 (three) days.    . diclofenac sodium (VOLTAREN) 1 % GEL Apply 2 g topically 4 (four) times daily. To affected joint. 100 g 11  . Esomeprazole Magnesium (NEXIUM PO) Take 20 mg by mouth every morning.     . fluticasone (FLONASE) 50 MCG/ACT nasal spray Place 2 sprays into both nostrils daily. 16 g 11  . Insulin Human (INSULIN PUMP)  SOLN Inject into the skin continuous. Novolog insulin    . isosorbide mononitrate (IMDUR) 120 MG 24 hr tablet Take 1 tablet (120 mg total) by mouth at bedtime. 90 tablet 3  . levothyroxine (SYNTHROID, LEVOTHROID) 25 MCG tablet Take 25 mcg by mouth daily before breakfast.    . Linoleic Acid Conjugated 1000 MG CAPS Take 1,000 mg by mouth daily.     . Methylsulfonylmethane (MSM) 1000 MG TABS Take by mouth. 1-4 times a day    . mupirocin  cream (BACTROBAN) 2 % Apply 1 application topically 3 (three) times daily. 30 g 0  . NATURE-THROID 32.5 MG tablet 32.5 mg.    . nitroGLYCERIN (NITROLINGUAL) 0.4 MG/SPRAY spray Place 1 spray under the tongue every 5 (five) minutes x 3 doses as needed for chest pain. 12 g 1  . NON FORMULARY Chloroella    . OVER THE COUNTER MEDICATION Take 750 mg by mouth at bedtime. GABA    . OVER THE COUNTER MEDICATION as directed. Magnesium gluconate    . OVER THE COUNTER MEDICATION as directed. Omega EPA/DHA    . OVER THE COUNTER MEDICATION as directed. Grape fruit seed extract    . potassium chloride (K-DUR,KLOR-CON) 10 MEQ tablet Take 1 tablet (10 mEq total) by mouth 2 (two) times daily. 180 tablet 3  . promethazine (PHENERGAN) 25 MG tablet Take 1 tablet (25 mg total) by mouth every 6 (six) hours as needed for nausea or vomiting. 30 tablet 2  . torsemide ([name]) 20 MG tablet TAKE ONE TABLET BY MOUTH ONCE DAILY IN THE MORNING.  MAY  TAKE  AN  EXTRA  ONE-HALF  TABLET  AS  NEEDED 135 tablet 3  . vitamin C (ASCORBIC ACID) 500 MG tablet Take 1 tablet (500 mg total) by mouth daily. 50 tablet 0  . VITAMIN K PO Take by mouth as directed.     No current facility-administered medications on file prior to visit.    Allergies  Allergen Reactions  . Dilaudid [Hydromorphone] Other (See Comments)    Because of continuous glucose monitor  . Morphine Anaphylaxis and Shortness Of Breath  . Morphine And Related Shortness Of Breath  . Penicillins Hives    Has patient had a PCN reaction causing immediate rash, facial/tongue/throat swelling, SOB or lightheadedness with hypotension: Yes Has patient had a PCN reaction causing severe rash involving mucus membranes or skin necrosis: No Has patient had a PCN reaction that required hospitalization No Has patient had a PCN reaction occurring within the last 10 years: No If all of the above answers are "NO", then may proceed with Cephalosporin use.  . Tramadol Anaphylaxis  .  Amitriptyline Other (See Comments)    Severe headache/ out of body feeling  . Codeine Other (See Comments)    Severe headaches/ out of body feeling  . Darvon [Propoxyphene] Other (See Comments)    Severe headache Severe headache Severe headaches / out of body feeling  . Other Swelling and Other (See Comments)    Cats itching Mold sinuses Cats itching Mold sinuses Cat hair and scratches cause swelling  . Statins Other (See Comments)    Muscle pains  . Sulfamethoxazole Other (See Comments)  . Acetaminophen Other (See Comments)    Alters insulin pump readings Alters insulin pump readings  . Advil [Ibuprofen] Other (See Comments)    Messes up CGM reading on glucose monitor  . Gluten Meal Diarrhea and Nausea And Vomiting    Cramping Cramping  . Mold Extract [Trichophyton] Other (  See Comments)    sinusitis  . Sulfa Antibiotics Other (See Comments)    Mouth ulcers  . Sulfites Other (See Comments)    Mouth ulcers  . Ultracet [Tramadol-Acetaminophen] Other (See Comments)    Small vessel heart attack  . Wheat Bran Diarrhea and Nausea And Vomiting    Cramping Cramping      Review of Systems:  Constitutional: No recent illness  HEENT: No  headache, no vision change  Cardiac: No  chest pain, No  pressure, No palpitations  Respiratory:  No  shortness of breath.   Musculoskeletal: +new myalgia/arthralgia  Skin: No  Rash  Neurologic: No  weakness, No  Dizziness   Exam:  BP (!) 152/78   Pulse 64   Ht _0  (1.499 m)   Wt 95 lb (43.1 kg)   BMI 19.19 kg/m   Constitutional: VS see above. General Appearance: alert, well-developed, well-nourished, NAD  Neck: No masses, trachea midline.   Respiratory: Normal respiratory effort. no wheeze, no rhonchi, no rales  Cardiovascular: S1/S2 normal RRR.   Musculoskeletal: Gait normal. Symmetric and independent movement of all extremities. (+)TTP R paraspinal cervical and upper thoracic muscle, tenderness R trapezius, normal  ROM R shoulder, negative spurling's   Neurological: Normal balance/coordination. No tremor.  Skin: warm, dry, intact.   Psychiatric: Normal judgment/insight. Normal mood and affect. Oriented x3.   C-spine XR: No results found.   ASSESSMENT/PLAN: Pt doesn't want "heavy duty medicines" or NSAID's d/t stomach upset. Has done well on flexeril in the past.   Neck pain without injury - Plan: cyclobenzaprine (FLEXERIL) 10 MG tablet, diclofenac sodium (VOLTAREN) 1 % GEL, DG Cervical Spine 2 or 3 views    Patient Instructions  Plan:  Medications as below: muscle relaxer cyclobenzaprine and anti-inflammatory voltaren gel   TENS unit use if you can find it at home  Physical therapy if no better - call us tomorrow or the day after if no better     Follow-up plan: Return if symptoms worsen or fail to improve.  Visit summary with medication list and pertinent instructions was printed for patient to review, alert [place] if any changes needed. All questions at time of visit were answered - patient instructed to contact office with any additional concerns. ER/RTC precautions were reviewed with the patient and understanding verbalized.   Note: Total time spent 25 minutes, greater than 50% of the visit was spent face-to-face counseling and coordinating care for the following: The encounter diagnosis was Neck pain without injury.Marland Kitchen

## 2016-10-28 ENCOUNTER — Telehealth: Payer: Self-pay | Admitting: Osteopathic Medicine

## 2016-10-28 NOTE — Telephone Encounter (Signed)
Pt called 10/28/16 adv that she needs a new tens unit the one she has does not work. Thanks

## 2016-10-31 ENCOUNTER — Other Ambulatory Visit: Payer: Self-pay

## 2016-10-31 DIAGNOSIS — I2583 Coronary atherosclerosis due to lipid rich plaque: Secondary | ICD-10-CM

## 2016-10-31 DIAGNOSIS — I5032 Chronic diastolic (congestive) heart failure: Secondary | ICD-10-CM

## 2016-10-31 DIAGNOSIS — I2510 Atherosclerotic heart disease of native coronary artery without angina pectoris: Secondary | ICD-10-CM

## 2016-10-31 MED ORDER — CARVEDILOL 3.125 MG PO TABS: 3.1250 mg | ORAL_TABLET | Freq: Two times a day (BID) | ORAL | 3 refills | Status: DC

## 2016-10-31 MED ORDER — LEVOTHYROXINE SODIUM 25 MCG PO TABS: 25.0000 ug | ORAL_TABLET | Freq: Every day | ORAL | 3 refills | Status: DC

## 2016-10-31 MED ORDER — ISOSORBIDE MONONITRATE ER 120 MG PO TB24: 120.0000 mg | ORAL_TABLET | Freq: Every day | ORAL | 3 refills | Status: DC

## 2016-10-31 MED ORDER — TORSEMIDE 20 MG PO TABS: ORAL_TABLET | ORAL | 3 refills | Status: DC

## 2016-10-31 MED ORDER — POTASSIUM CHLORIDE CRYS ER 10 MEQ PO TBCR: 10.0000 meq | EXTENDED_RELEASE_TABLET | Freq: Two times a day (BID) | ORAL | 3 refills | Status: DC

## 2016-10-31 MED ORDER — CYANOCOBALAMIN 1000 MCG/ML IJ KIT: 1000.0000 ug | PACK | INTRAMUSCULAR | 3 refills | Status: DC

## 2016-10-31 MED ORDER — NATURE-THROID 32.5 MG PO TABS: 32.5000 mg | ORAL_TABLET | Freq: Every day | ORAL | 0 refills | Status: DC

## 2016-10-31 NOTE — Telephone Encounter (Signed)
Called patient back did not get an answer, left a message on her vm advising that message will be forwarded to Dr. Sheppard Coil and that Rx will be ready possibly on Thursday. Rhonda Cunningham,CMA

## 2016-10-31 NOTE — Telephone Encounter (Signed)
Patient request refill for mediations. Routine medications were sent to [facility] by request of the patient. Rhonda Cunningham,CMA

## 2016-11-04 ENCOUNTER — Other Ambulatory Visit: Payer: Self-pay | Admitting: Osteopathic Medicine

## 2016-11-04 MED ORDER — B-12 1000 MCG PO CAPS: 1000.0000 ug | ORAL_CAPSULE | Freq: Every day | ORAL | 3 refills | Status: DC

## 2016-11-04 NOTE — Progress Notes (Signed)
Fixed b12 orders

## 2016-12-09 ENCOUNTER — Other Ambulatory Visit: Payer: Self-pay

## 2016-12-09 MED ORDER — NATURE-THROID 32.5 MG PO TABS: 32.5000 mg | ORAL_TABLET | Freq: Every day | ORAL | 1 refills | Status: DC

## 2016-12-09 NOTE — Telephone Encounter (Signed)
Refill request for Brittney Pace thyroid # 90 [phone] refills sent to [facility]. Brittney Pace,CMA

## 2016-12-23 DIAGNOSIS — G9340 Encephalopathy, unspecified: Secondary | ICD-10-CM | POA: Insufficient documentation

## 2016-12-23 MED ORDER — GENERIC EXTERNAL MEDICATION
10.00 | Status: DC
Start: 2016-12-23 — End: 2016-12-23

## 2016-12-23 MED ORDER — GENERIC EXTERNAL MEDICATION
Status: DC
Start: ? — End: 2016-12-23

## 2016-12-23 MED ORDER — GENERIC EXTERNAL MEDICATION
750.00 | Status: DC
Start: 2016-12-24 — End: 2016-12-23

## 2016-12-23 MED ORDER — SODIUM CHLORIDE 0.9 % IV SOLN
INTRAVENOUS | Status: DC
Start: ? — End: 2016-12-23

## 2016-12-27 ENCOUNTER — Emergency Department (INDEPENDENT_AMBULATORY_CARE_PROVIDER_SITE_OTHER)
Admission: EM | Admit: 2016-12-27 | Discharge: 2016-12-27 | Disposition: A | Discharge status: Home / Self Care | Payer: BLUE CROSS/BLUE SHIELD | Source: Home / Self Care | Attending: Family Medicine | Admitting: Family Medicine

## 2016-12-27 ENCOUNTER — Other Ambulatory Visit: Payer: Self-pay

## 2016-12-27 ENCOUNTER — Emergency Department (INDEPENDENT_AMBULATORY_CARE_PROVIDER_SITE_OTHER): Payer: BLUE CROSS/BLUE SHIELD

## 2016-12-27 DIAGNOSIS — M5010 Cervical disc disorder with radiculopathy, unspecified cervical region: Secondary | ICD-10-CM

## 2016-12-27 DIAGNOSIS — M5412 Radiculopathy, cervical region: Secondary | ICD-10-CM | POA: Diagnosis not present

## 2016-12-27 MED ORDER — ISOSORBIDE MONONITRATE ER 30 MG PO TB24
120.00 | ORAL_TABLET | ORAL | Status: DC
Start: 2016-12-26 — End: 2016-12-27

## 2016-12-27 MED ORDER — LEVOTHYROXINE SODIUM 25 MCG PO TABS
ORAL_TABLET | ORAL | Status: DC
Start: 2016-12-26 — End: 2016-12-27

## 2016-12-27 MED ORDER — GENERIC EXTERNAL MEDICATION
10.00 | Status: DC
Start: 2016-12-25 — End: 2016-12-27

## 2016-12-27 MED ORDER — MODAFINIL 100 MG PO TABS
100.00 | ORAL_TABLET | ORAL | Status: DC
Start: 2016-12-26 — End: 2016-12-27

## 2016-12-27 MED ORDER — KETOROLAC TROMETHAMINE 10 MG PO TABS: 10.0000 mg | ORAL_TABLET | Freq: Three times a day (TID) | ORAL | 0 refills | Status: DC | PRN

## 2016-12-27 MED ORDER — LEVOTHYROXINE-LIOTHYRONINE 30 MG PO TABS
30.00 | ORAL_TABLET | ORAL | Status: DC
Start: 2016-12-26 — End: 2016-12-27

## 2016-12-27 MED ORDER — GENERIC EXTERNAL MEDICATION
Status: DC
Start: ? — End: 2016-12-27

## 2016-12-27 MED ORDER — PANTOPRAZOLE SODIUM 40 MG PO TBEC
40.00 | DELAYED_RELEASE_TABLET | ORAL | Status: DC
Start: 2016-12-26 — End: 2016-12-27

## 2016-12-27 MED ORDER — ASPIRIN EC 81 MG PO TBEC
81.00 | DELAYED_RELEASE_TABLET | ORAL | Status: DC
Start: 2016-12-26 — End: 2016-12-27

## 2016-12-27 MED ORDER — HYDRALAZINE HCL 20 MG/ML IJ SOLN
10.00 | INTRAMUSCULAR | Status: DC
Start: ? — End: 2016-12-27

## 2016-12-27 MED ORDER — KETOROLAC TROMETHAMINE 30 MG/ML IJ SOLN
30.0000 mg | Freq: Once | INTRAMUSCULAR | Status: AC
  Administered 2016-12-27: 30 mg via INTRAMUSCULAR

## 2016-12-27 MED ORDER — CARVEDILOL 3.125 MG PO TABS
3.13 | ORAL_TABLET | ORAL | Status: DC
Start: 2016-12-25 — End: 2016-12-27

## 2016-12-27 MED ORDER — ACETAMINOPHEN 650 MG RE SUPP
650.00 | RECTAL | Status: DC
Start: ? — End: 2016-12-27

## 2016-12-27 MED ORDER — ALBUTEROL SULFATE (2.5 MG/3ML) 0.083% IN NEBU
2.50 | INHALATION_SOLUTION | RESPIRATORY_TRACT | Status: DC
Start: ? — End: 2016-12-27

## 2016-12-27 MED ORDER — ONDANSETRON 4 MG PO TBDP
4.0000 mg | ORAL_TABLET | Freq: Once | ORAL | Status: AC
  Administered 2016-12-27: 4 mg via ORAL

## 2016-12-27 MED ORDER — SODIUM CHLORIDE 0.9 % IV SOLN
INTRAVENOUS | Status: DC
Start: ? — End: 2016-12-27

## 2016-12-27 MED ORDER — NITROGLYCERIN 0.4 MG SL SUBL
0.40 | SUBLINGUAL_TABLET | SUBLINGUAL | Status: DC
Start: ? — End: 2016-12-27

## 2016-12-27 MED ORDER — GENERIC EXTERNAL MEDICATION
2.00 | Status: DC
Start: 2016-12-25 — End: 2016-12-27

## 2016-12-27 MED ORDER — GENERIC EXTERNAL MEDICATION
750.00 | Status: DC
Start: 2016-12-25 — End: 2016-12-27

## 2016-12-27 NOTE — ED Triage Notes (Signed)
Started with a headache last Wednesday.  Went to [facility] ER, and was sent to [facility].  Had 2 LP to R/O menengitis, came home Sunday.  Woke up with a headache Monday morning.  Has a hx of neck issues, and is questioning if the headaches are coming from that.

## 2016-12-27 NOTE — ED Provider Notes (Signed)
[name]    CSN: [id number] Arrival date & time: 12/27/16  9470     History   Chief Complaint Chief Complaint  Patient presents with  . Migraine    HPI Brittney Pace is a 61 y.o. female.   Patient developed a severe headache four days ago.  She was admitted to [facility] where meningitis was ruled out with LP.  MR of head was negative.  She continues to have an occipital headache radiating to her right neck and shoulder area.  She has a history of chronic neck pain, and wonders if this may be the cause of her symptoms.      The history is provided by the patient.    Past Medical History:  Diagnosis Date  . Anemia   . Arthritis   . 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 Chronic kidney disease    stage 3  . Coronary artery disease   . Depression   . Diabetes mellitus without complication (HCC)    Type 1  . Diabetic retinopathy ([place])   . Family history of adverse reaction to anesthesia    mother: " while she was under she stopped breathing."  . Fibromyalgia   . Gastroparesis   . GERD (gastroesophageal reflux disease)   . Headache    migraines  . Hypothyroidism   . IBS (irritable bowel syndrome)   . Idiopathic edema   . Lyme disease   . Mitral valve prolapse   . Myocardial infarction ([place])    1 major in 1999 and 2 minor " small vessel disease."  . Osteoporosis   . Peripheral neuropathy   . Peripheral vascular disease ([place])   . 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 Stroke [facility])    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" " the second stroke was a lacunar     Patient Active Problem List   Diagnosis Date Noted  . Brain atrophy 05/24/2016  . Chronic headache 05/24/2016  . Lacunar stroke 01/20/2016  . Memory loss 01/20/2016  . Other fatigue 01/20/2016  . CAD (coronary artery disease) of artery bypass graft 01/20/2016  . Depression 01/20/2016  . Right lateral epicondylitis 12/16/2015  . Babesiosis 12/16/2015    . Lyme disease 12/16/2015  . Hyperlipidemia 09/25/2015  . Myopia of both eyes with astigmatism and presbyopia 05/21/2015  . Vitreous syneresis of both eyes 05/21/2015  . Combined form of age-related cataract, left eye 05/21/2015  . Surgery, elective 05/06/2015  . CAD (coronary artery disease) 11/19/2014  . Diabetes mellitus type I ([place]) 11/19/2014  . DDD (degenerative disc disease), cervical 03/11/2014  . Vitreous hemorrhage of left eye ([place]) 03/03/2014  . DDD (degenerative disc disease), thoracic 01/07/2014  . Anemia due to stage 3 chronic kidney disease ([place]) 12/16/2013  . Osteoporosis 11/14/2013  . Chronic diastolic heart failure ([place]) 09/18/2013  . Cataract due to secondary diabetes ([place]) 08/26/2013  . 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 associated with type 2 diabetes mellitus ([place]) 08/26/2013  . Hypothyroidism due to acquired atrophy of thyroid 08/02/2013  . Cerebral artery occlusion with cerebral infarction ([place]) 07/14/2013  . History of diabetic gastroparesis 04/09/2013  . Irritable bowel syndrome with diarrhea 04/09/2013  . Anemia of chronic disease 04/08/2013  . 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 ABDOMINAL HYSTERECTOMY    . APPENDECTOMY    .  BREAST SURGERY     B/L biopsy and lumpectomy   . CARDIAC CATHETERIZATION    . CARPAL TUNNEL RELEASE    . CATARACT EXTRACTION W/ INTRAOCULAR LENS IMPLANT     right eye  . COLONOSCOPY W/ BIOPSIES AND POLYPECTOMY    . CORONARY ARTERY BYPASS GRAFT    . coronary artery stents     at [place] and [place]  . ECTOPIC PREGNANCY SURGERY    . NASAL SEPTUM SURGERY    . OPEN REDUCTION INTERNAL FIXATION (ORIF) DISTAL RADIAL FRACTURE Right 05/06/2015   Procedure: OPEN REDUCTION INTERNAL FIXATION (ORIF) RIGHT DISTAL RADIAL FRACTURE AND REPAIRS AS NEEDED;  Surgeon: Iran Planas, MD;  Location: [facility];  Service: Orthopedics;   Laterality: Right;  . ORIF WRIST FRACTURE Left 11/05/2014  . ORIF WRIST FRACTURE Left 11/05/2014   Procedure: OPEN REDUCTION INTERNAL FIXATION (ORIF) LEFT WRIST FRACTURE AND REPAIR AS INDICATED;  Surgeon: Iran Planas, MD;  Location: [facility];  Service: Orthopedics;  Laterality: Left;  . TRIGGER FINGER RELEASE      OB History    No data available       Home Medications    Prior to Admission medications   Medication Sig Start Date End Date Taking? Authorizing Provider  acyclovir (ZOVIRAX) 400 MG tablet Take 1 tablet (400 mg total) by mouth 2 (two) times daily. 12/16/15   Emeterio Reeve, DO  Alpha-Lipoic Acid 600 MG CAPS Take 600 mg by mouth 2 (two) times daily.     [provider]  arginine 500 MG tablet Take 500 mg by mouth daily.    [provider]  Armodafinil 250 MG tablet Take 1/2 to 1 pill in the mornings 05/24/16   Sater, Nanine Means, MD  aspirin EC 325 MG tablet Take 325 mg by mouth at bedtime.    [provider]  carvedilol (COREG) 3.125 MG tablet Take 1 tablet (3.125 mg total) by mouth 2 (two) times daily with a meal. 10/31/16   Emeterio Reeve, DO  cetirizine (ZYRTEC) 10 MG tablet Take 10 mg by mouth at bedtime.    [provider]  Cholecalciferol (VITAMIN D3) 5000 UNITS TABS Take 1,000 Units by mouth daily.     [provider]  Cyanocobalamin (B-12) 1000 MCG CAPS Take 1,000 mcg by mouth daily. 11/04/16   Emeterio Reeve, DO  cyclobenzaprine (FLEXERIL) 10 MG tablet Take 0.5-1 tablets (5-10 mg total) by mouth 3 (three) times daily as needed for muscle spasms. Caution: can cause drowsiness 10/12/16   Emeterio Reeve, DO  diclofenac sodium (VOLTAREN) 1 % GEL Apply 2 g topically 4 (four) times daily. To affected joint. 10/12/16   Emeterio Reeve, DO  Esomeprazole Magnesium (NEXIUM PO) Take 20 mg by mouth every morning.     [provider]  fluticasone (FLONASE) 50 MCG/ACT nasal spray Place 2 sprays into both nostrils  daily. 07/14/16   Emeterio Reeve, DO  Insulin Human (INSULIN PUMP) SOLN Inject into the skin continuous. Novolog insulin    [provider]  isosorbide mononitrate (IMDUR) 120 MG 24 hr tablet Take 1 tablet (120 mg total) by mouth at bedtime. 10/31/16   Emeterio Reeve, DO  ketorolac (TORADOL) 10 MG tablet Take 1 tablet (10 mg total) by mouth every 8 (eight) hours as needed for severe pain. 12/27/16   Kandra Nicolas, MD  levothyroxine (SYNTHROID, LEVOTHROID) 25 MCG tablet Take 1 tablet (25 mcg total) by mouth daily before breakfast. 10/31/16   Emeterio Reeve, DO  Linoleic Acid Conjugated 1000  MG CAPS Take 1,000 mg by mouth daily.     [provider]  Methylsulfonylmethane (MSM) 1000 MG TABS Take by mouth. 1-4 times a day    [provider]  mupirocin cream (BACTROBAN) 2 % Apply 1 application topically 3 (three) times daily. 08/26/16   Kandra Nicolas, MD  NATURE-THROID 32.5 MG tablet Take 1 tablet (32.5 mg total) daily by mouth. 12/09/16   Emeterio Reeve, DO  nitroGLYCERIN (NITROLINGUAL) 0.4 MG/SPRAY spray Place 1 spray under the tongue every 5 (five) minutes x 3 doses as needed for chest pain. 01/02/15   Lelon Perla, MD  NON FORMULARY Chloroella    [provider]  OVER THE COUNTER MEDICATION Take 750 mg by mouth at bedtime. GABA    [provider]  OVER THE COUNTER MEDICATION as directed. Magnesium gluconate    [provider]  OVER THE COUNTER MEDICATION as directed. Omega EPA/DHA    [provider]  OVER THE COUNTER MEDICATION as directed. Grape fruit seed extract    [provider]  potassium chloride (K-DUR,KLOR-CON) 10 MEQ tablet Take 1 tablet (10 mEq total) by mouth 2 (two) times daily. 10/31/16   Emeterio Reeve, DO  promethazine (PHENERGAN) 25 MG tablet Take 1 tablet (25 mg total) by mouth every 6 (six) hours as needed for nausea or vomiting. 08/09/16   Emeterio Reeve, DO  torsemide ([name]) 20 MG  tablet TAKE ONE TABLET BY MOUTH ONCE DAILY IN THE MORNING.  MAY  TAKE  AN  EXTRA  ONE-HALF  TABLET  AS  NEEDED 10/31/16   Emeterio Reeve, DO  vitamin C (ASCORBIC ACID) 500 MG tablet Take 1 tablet (500 mg total) by mouth daily. 05/06/15   Iran Planas, MD  VITAMIN K PO Take by mouth as directed.    [provider]    Family History Family History  Problem Relation Age of Onset  . Breast cancer Mother   . Heart disease Father   . Hypertension Father   . Breast cancer Sister     Social History Social History   Tobacco Use  . Smoking status: Former Smoker    Types: Cigarettes  . Smokeless tobacco: Never Used  . Tobacco comment:  " Quit smoking cigarettes in 20's "  Substance Use Topics  . Alcohol use: Yes    Comment: occasional beer or wine  . Drug use: No     Allergies   Dilaudid [hydromorphone]; Morphine; Morphine and related; Penicillins; Tramadol; Amitriptyline; Codeine; Darvon [propoxyphene]; Other; Statins; Sulfamethoxazole; Acetaminophen; Advil [ibuprofen]; Gluten meal; Mold extract [trichophyton]; Sulfa antibiotics; Sulfites; Ultracet [tramadol-acetaminophen]; and Wheat bran   Review of Systems Review of Systems  Constitutional: Positive for activity change. Negative for appetite change, chills, diaphoresis, fatigue and fever.  HENT: Negative.   Eyes: Negative.   Respiratory: Negative.   Cardiovascular: Negative.   Gastrointestinal: Negative.   Genitourinary: Negative.   Musculoskeletal: Positive for back pain, neck pain and neck stiffness.  Skin: Negative.   Neurological: Negative for headaches.     Physical Exam Triage Vital Signs ED Triage Vitals  Enc Vitals Group     BP 12/27/16 0925 (!) 158/85     Pulse Rate 12/27/16 0925 71     Resp --      Temp 12/27/16 0925 98.3 F (36.8 C)     Temp Source 12/27/16 0925 Oral     SpO2 12/27/16 0925 99 %     Weight 12/27/16 0926 94 lb (42.6 kg)  Height 12/27/16 0926 4' 11.75" (1.518 m)     Head  Circumference --      Peak Flow --      Pain Score 12/27/16 0926 8     Pain Loc --      Pain Edu? --      Excl. in [place]? --    No data found.  Updated Vital Signs BP (!) 158/85 (BP Location: Right Arm)   Pulse 71   Temp 98.3 F (36.8 C) (Oral)   Ht 4' 11.75" (1.518 m)   Wt 94 lb (42.6 kg)   SpO2 99%   BMI 18.51 kg/m   Visual Acuity Right Eye Distance:   Left Eye Distance:   Bilateral Distance:    Right Eye Near:   Left Eye Near:    Bilateral Near:     Physical Exam  Constitutional: She appears well-developed and well-nourished. No distress.  HENT:  Head: Normocephalic.  Right Ear: External ear normal.  Left Ear: External ear normal.  Nose: Nose normal.  Mouth/Throat: Oropharynx is clear and moist.  Eyes: Conjunctivae are normal. Pupils are equal, round, and reactive to light.  Neck: Neck supple. Muscular tenderness present. Decreased range of motion present.    Tenderness right neck and trapezius area as noted on diagram.   Cardiovascular: Normal heart sounds.  Pulmonary/Chest: Breath sounds normal.  Abdominal: There is no tenderness.  Lymphadenopathy:    She has no cervical adenopathy.  Neurological: She is alert.  Skin: Skin is warm and dry. No rash noted.  Nursing note and vitals reviewed.    UC Treatments / Results  Labs (all labs ordered are listed, but only abnormal results are displayed) Labs Reviewed - No data to display  EKG  EKG Interpretation None       Radiology Dg Cervical Spine Complete  Result Date: 12/27/2016 CLINICAL DATA:  Recurrent headache with radiation down right neck. No known injury. EXAM: CERVICAL SPINE - COMPLETE 4+ VIEW COMPARISON:  10/12/2016. FINDINGS: Diffuse multilevel degenerative change. No acute bony abnormality identified. No evidence of fracture or dislocation. Diffuse osteopenia. Pulmonary apices are clear. Fractured sternal wires noted. Similar finding noted on prior exam. The carotid vascular disease. IMPRESSION:  Diffuse multilevel degenerative change. No acute abnormality identified. Electronically Signed   By: Marcello Moores  Register   On: 12/27/2016 10:19    Procedures Procedures (including critical care time)  Medications Ordered in UC Medications  ketorolac (TORADOL) 30 MG/ML injection 30 mg (30 mg Intramuscular Given 12/27/16 0957)  ondansetron (ZOFRAN-ODT) disintegrating tablet 4 mg (4 mg Oral Given 12/27/16 0954)     Initial Impression / Assessment and Plan / UC Course  I have reviewed the triage vital signs and the nursing notes.  Pertinent labs & imaging results that were available during my care of the patient were reviewed by me and considered in my medical decision making (see chart for details).    Administered Toradol 30mg  IM.  Administered Zofran ODT 4mg  PO  Dispensed soft cervical collar. Wear cervical collar. Apply ice pack for 20 to 30 minutes, 3 to 4 times daily   Followup with Dr. Georgina Snell for further management.    Final Clinical Impressions(s) / UC Diagnoses   Final diagnoses:  Cervical disc disorder with radiculopathy of cervical region    ED Discharge Orders        Ordered    ketorolac (TORADOL) 10 MG tablet  Every 8 hours PRN     12/27/16 1056  Kandra Nicolas, MD 12/28/16 2041

## 2016-12-27 NOTE — Discharge Instructions (Addendum)
Wear cervical collar. Apply ice pack for 20 to 30 minutes, 3 to 4 times daily

## 2016-12-28 ENCOUNTER — Telehealth: Payer: Self-pay | Admitting: Emergency Medicine

## 2016-12-29 NOTE — Telephone Encounter (Signed)
Feeling about the same.  Can not get medication until tomorrow.  Will follow up as needed

## 2016-12-30 ENCOUNTER — Encounter: Payer: Self-pay | Admitting: Family Medicine

## 2016-12-30 ENCOUNTER — Ambulatory Visit (INDEPENDENT_AMBULATORY_CARE_PROVIDER_SITE_OTHER): Payer: BLUE CROSS/BLUE SHIELD | Admitting: Family Medicine

## 2016-12-30 DIAGNOSIS — M542 Cervicalgia: Secondary | ICD-10-CM | POA: Diagnosis not present

## 2016-12-30 NOTE — Patient Instructions (Signed)
Thank you for coming in today. Recheck after MRI.  Attend PT.  Continue heating pad Use TENS unit and traction.   TENS UNIT: This is helpful for muscle pain and spasm.   Search and Purchase a TENS 7000 2nd edition at  www.tenspros.com or www.[employer].com It should be less than $30.     TENS unit instructions: Do not shower or bathe with the unit on Turn the unit off before removing electrodes or batteries If the electrodes lose stickiness add a drop of water to the electrodes after they are disconnected from the unit and place on plastic sheet. If you continued to have difficulty, call the TENS unit company to purchase more electrodes. Do not apply lotion on the skin area prior to use. Make sure the skin is clean and dry as this will help prolong the life of the electrodes. After use, always check skin for unusual red areas, rash or other skin difficulties. If there are any skin problems, does not apply electrodes to the same area. Never remove the electrodes from the unit by pulling the wires. Do not use the TENS unit or electrodes other than as directed. Do not change electrode placement without consultating your therapist or physician. Keep 2 fingers with between each electrode. Wear time ratio is 2:1, on to off times.    For example on for 30 minutes off for 15 minutes and then on for 30 minutes off for 15 minutes

## 2016-12-30 NOTE — Progress Notes (Signed)
Subjective:    I'm seeing this patient as a consultation for:  Brittney Reeve, DO   CC: Neck Pain  HPI: [name] has chronic neck pain.  She has had pain worsening for the last several month.  She is tried acupuncture which does not work well.  She is also tried some over-the-counter medicines which did not help much.  She was seen in urgent care on December 4 were x-ray show diffuse degenerative changes throughout the cervical spine.  She was given a soft collar which she says helps a bit.  She notes chronic neck pain worse with activity better with rest.  She has allergies or intolerances to multiple occasions.  Her medical history is significant for type 1 diabetes and chronic symptoms from Lyme disease.  Past medical history, Surgical history, Family history not pertinant except as noted below, Social history, Allergies, and medications have been entered into the medical record, reviewed, and no changes needed.   Review of Systems: No headache, visual changes, nausea, vomiting, diarrhea, constipation, dizziness, abdominal pain, skin rash, fevers, chills, night sweats, weight loss, swollen lymph nodes, body aches, joint swelling, muscle aches, chest pain, shortness of breath, mood changes, visual or auditory hallucinations.   Objective:    Vitals:   12/30/16 1047  BP: 118/72  Pulse: 73   General: Well Developed, well nourished, and in no acute distress.  Neuro/Psych: Alert and oriented x3, extra-ocular muscles intact, able to move all 4 extremities, sensation grossly intact. Skin: Warm and dry, no rashes noted.  Respiratory: Not using accessory muscles, speaking in full sentences, trachea midline.  Cardiovascular: Pulses palpable, no extremity edema. Abdomen: Does not appear distended. MSK: C-spine: Nontender to spinal midline.  Tender to palpation bilateral cervical paraspinal muscles. Decreased range of motion.  Upper extremity strength equal and normal throughout.  Reflexes  equal and normal throughout.  No results found for this or any previous visit (from the past 24 hour(s)). No results found.  Impression and Recommendations:    Assessment and Plan: 61 y.o. female with chronic neck pain.  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  Patient is failing conservative management and I think proceeding with MRI will be helpful to further dictate care.  In the interim I think physical therapy is also helpful.  Plan to order physical therapy and noncontrast cervical MRI..  Follow-up after MRI.   Orders Placed This Encounter  Procedures  . MR Cervical Spine Wo Contrast    Standing Status:   Future    Standing Expiration Date:   03/02/2018    Order Specific Question:   What is the patient's sedation requirement?    Answer:   No Sedation    Order Specific Question:   Does the patient have a pacemaker or implanted devices?    Answer:   No    Order Specific Question:   Preferred imaging location?    Answer:   Product/process development scientist (table limit-350lbs)    Order Specific Question:   Radiology Contrast Protocol - do NOT remove file path    Answer:   [email or web address]  . Ambulatory referral to Physical Therapy    Referral Priority:   Routine    Referral Type:   Physical Medicine    Referral Reason:   Specialty Services Required    Requested Specialty:   Physical Therapy   No orders of the defined types were placed in this encounter.   Discussed warning signs or symptoms. Please see discharge instructions.  Patient expresses understanding.

## 2017-01-04 ENCOUNTER — Encounter: Payer: Self-pay | Admitting: Family Medicine

## 2017-01-07 ENCOUNTER — Encounter: Payer: Self-pay | Admitting: Osteopathic Medicine

## 2017-01-09 ENCOUNTER — Telehealth: Payer: Self-pay | Admitting: Family Medicine

## 2017-01-09 NOTE — Telephone Encounter (Signed)
MRI C-spine: Auth number: [id number]

## 2017-01-10 NOTE — Telephone Encounter (Signed)
Routing this to PCP, I was about to tell her to just update [place] on all the reminders so that we could check them off and take them out.

## 2017-01-13 ENCOUNTER — Ambulatory Visit (INDEPENDENT_AMBULATORY_CARE_PROVIDER_SITE_OTHER): Payer: BLUE CROSS/BLUE SHIELD | Admitting: Physical Therapy

## 2017-01-13 DIAGNOSIS — M542 Cervicalgia: Secondary | ICD-10-CM | POA: Diagnosis not present

## 2017-01-13 DIAGNOSIS — R293 Abnormal posture: Secondary | ICD-10-CM | POA: Diagnosis not present

## 2017-01-13 DIAGNOSIS — M6281 Muscle weakness (generalized): Secondary | ICD-10-CM | POA: Diagnosis not present

## 2017-01-13 NOTE — Therapy (Signed)
[place], [place], [address] Phone: [phone]   Fax:  [phone]  Physical Therapy Evaluation  Patient Details  Name: Brittney Pace MRN: [id number] Date of Birth: 02/06/1955 Referring Provider: Dr Steva Colder   Encounter Date: 01/13/2017  PT End of Session - 01/13/17 1344    Visit Number  1    Number of Visits  6    Date for PT Re-Evaluation  02/24/17    PT Start Time  1344    PT Stop Time  1505    PT Time Calculation (min)  81 min    Activity Tolerance  Patient limited by pain       Past Medical History:  Diagnosis Date  . Anemia   . Arthritis   . 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 Chronic kidney disease    stage 3  . Coronary artery disease   . Depression   . Diabetes mellitus without complication (HCC)    Type 1  . Diabetic retinopathy ([place])   . Family history of adverse reaction to anesthesia    mother: " while she was under she stopped breathing."  . Fibromyalgia   . Gastroparesis   . GERD (gastroesophageal reflux disease)   . Headache    migraines  . Hypothyroidism   . IBS (irritable bowel syndrome)   . Idiopathic edema   . Lyme disease   . Mitral valve prolapse   . Myocardial infarction ([place])    1 major in 1999 and 2 minor " small vessel disease."  . Osteoporosis   . Peripheral neuropathy   . Peripheral vascular disease ([place])   . 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 Stroke [facility])    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" " the second stroke was a lacunar     Past Surgical History:  Procedure Laterality Date  . ABDOMINAL HYSTERECTOMY    . APPENDECTOMY    . BREAST SURGERY     B/L biopsy and lumpectomy   . CARDIAC CATHETERIZATION    . CARPAL TUNNEL RELEASE    . CATARACT EXTRACTION W/ INTRAOCULAR LENS IMPLANT     right eye  . COLONOSCOPY W/ BIOPSIES AND POLYPECTOMY    . CORONARY ARTERY BYPASS GRAFT    . coronary artery stents      at [facility]  . ECTOPIC PREGNANCY SURGERY    . NASAL SEPTUM SURGERY    . OPEN REDUCTION INTERNAL FIXATION (ORIF) DISTAL RADIAL FRACTURE Right 05/06/2015   Procedure: OPEN REDUCTION INTERNAL FIXATION (ORIF) RIGHT DISTAL RADIAL FRACTURE AND REPAIRS AS NEEDED;  Surgeon: Iran Planas, MD;  Location: [place];  Service: Orthopedics;  Laterality: Right;  . ORIF WRIST FRACTURE Left 11/05/2014  . ORIF WRIST FRACTURE Left 11/05/2014   Procedure: OPEN REDUCTION INTERNAL FIXATION (ORIF) LEFT WRIST FRACTURE AND REPAIR AS INDICATED;  Surgeon: Iran Planas, MD;  Location: [place];  Service: Orthopedics;  Laterality: Left;  . TRIGGER FINGER RELEASE      There were no vitals filed for this visit.   Subjective Assessment - 01/13/17 1345    Subjective  Pt reports she developed neck pain this past summer, insideous onset. She has a h/o CVA after tweaking her neck while working 1998 and has been disability due to ruptured disc. MD's at that time recommended neck surgery however she declined and used acupunture and massage to help it get better.  For this episode of injury she has been using minimal muscle relaxers.     Pertinent History  osteoporosis, h/o CVA, chronic lymes dz,  - hasn't been to exercise for 10 yrs as it flares up her lymes dz. stage III kidney dz, anemia. pt reports she has a "crack " in her thoracic spine at the bra level since 2009    Diagnostic tests  MRI - in the past, a new one has been ordered, x-rays show degeneration     Patient Stated Goals  not sure, has never had PT for her neck and doesn't know what we can do.     Currently in Pain?  Yes    Pain Score  6     Pain Location  Neck    Pain Orientation  Left;Right    Pain Descriptors / Indicators  Aching;Sharp    Pain Type  Chronic pain    Pain Radiating Towards  into Rt shoulder upper trap    Pain Onset  More than a month ago    Pain Frequency  Constant    Aggravating Factors   worse at night, interrupts sleep     Pain Relieving  Factors  heat at night, muscle relaxers         [facility] PT Assessment - 01/13/17 0001      Assessment   Medical Diagnosis  cervicalgia     Referring Provider  Dr Steva Colder    Onset Date/Surgical Date  08/13/16    Hand Dominance  Right    Next MD Visit  PRN    Prior Therapy  not PT      Precautions   Precautions  Other (comment) not in terms of neck and arms    Precaution Comments  limited with exercise due to lymes dz      Balance Screen   Has the patient fallen in the past 6 months  No      Prior Function   Level of Independence  Independent    Vocation  On disability    Leisure  sedentary, getting ready to move into a new house      Observation/Other Assessments   Focus on Therapeutic Outcomes (FOTO)   60% limited      Posture/Postural Control   Posture/Postural Control  Postural limitations    Postural Limitations  Rounded Shoulders;Increased thoracic kyphosis very small frame      ROM / Strength   AROM / PROM / Strength  AROM;Strength      AROM   AROM Assessment Site  Shoulder;Elbow;Cervical    Right/Left Shoulder  -- WNL, pain Rt shoulder with all motion    Cervical Flexion  WNL    Cervical Extension  23, pulling into her Rt shoulder     Cervical - Right Side Bend  32    Cervical - Left Side Bend  32 with Rt side pain    Cervical - Right Rotation  48    Cervical - Left Rotation  56      Strength   Strength Assessment Site  Shoulder;Elbow    Right/Left Shoulder  -- WNL except ER 4/5    Right/Left Elbow  -- bilat WNL      Palpation   Palpation comment  very tight in bilat upper traps/levators and pericervical muscles              Objective measurements completed on examination: See above findings.      [place] Adult PT Treatment/Exercise -  01/13/17 0001      Exercises   Exercises  Neck      Neck Exercises: Supine   Other Supine Exercise  MEEKS decompression series 10 reps each , 2-3 sec holds      Modalities   Modalities  Electrical  Stimulation;Moist Heat      Moist Heat Therapy   Number Minutes Moist Heat  15 Minutes    Moist Heat Location  Cervical thoracic      Electrical Stimulation   Electrical Stimulation Location  cervical and bilat upper shoulders    Electrical Stimulation Action  TENS 7000 unit, tried normal and modulated    Electrical Stimulation Parameters  to tolerance    Electrical Stimulation Goals  Tone;Pain             PT Education - 01/13/17 1437    Education provided  Yes    Education Details  HEP & TENs    Person(s) Educated  Patient    Methods  Explanation;Demonstration;Handout    Comprehension  Verbalized understanding;Returned demonstration          PT Long Term Goals - 01/13/17 1439      PT LONG TERM GOAL #1   Title  I with HEP ( 02/24/17)     Time  6    Period  Weeks    Status  New      PT LONG TERM GOAL #2   Title  report =/> 50% reduction in neck pain with daily activity ( 02/24/17)     Time  6    Period  Weeks    Status  New      PT LONG TERM GOAL #3   Title  improve FOTO =/< 44% limitations ( 02/24/17)     Time  6    Period  Weeks    Status  New      PT LONG TERM GOAL #4   Title  improve cervical rotation =/> 75 degrees bilat ( 02/24/17)     Time  6    Period  Weeks    Status  New      PT LONG TERM GOAL #5   Title  report improvement in sleep =/> 25% ( 02/24/17)     Time  6    Period  Weeks    Status  New             Plan - 01/13/17 1411    Clinical Impression Statement  61 yo female with complicated medical hx presents with flare up of neck pain this past summer of insideous onset.  She has used a TENs machine in the past with good results however is hesitant to try again  She has limited cervical motion, some weakness in her UE's . Brittney Pace also has postural changes that are frequently associated with osteoporosis and muscular tightness in her neck and upper shoulders. Her progress will be slow due to multiple other medical dagnosis that will limit her  ability to tolerate exercise and manual therapy.   Brittney Pace tried the [employer] today with heat and did not feel like it would be a beneficial modality for her.    History and Personal Factors relevant to plan of care:  osteoporosis, fibromyalgia, stage III kidney dz, DM type I, chronic lymes dz, h/o CVA, cardiac bypass graft     Clinical Presentation  Evolving    Clinical Decision Making  Moderate    PT Frequency  1x / week pt not driving  and will have difficulty coming more frequently.     PT Duration  6 weeks    PT Treatment/Interventions  Manual techniques;Moist Heat;Traction;Ultrasound;Electrical Stimulation;Cryotherapy;Passive range of motion;Taping;Patient/family education;Therapeutic exercise    PT Next Visit Plan  assess tolerance to HEP, slowly add in posterior body strengthening and RTC strengthening, attempt manual work to neck and upper shoulders, modalties PRN    Consulted and Agree with Plan of Care  Patient       Patient will benefit from skilled therapeutic intervention in order to improve the following deficits and impairments:  Pain, Improper body mechanics, Postural dysfunction, Increased muscle spasms, Decreased range of motion, Decreased strength, Impaired UE functional use  Visit Diagnosis: Cervicalgia - Plan: PT plan of care cert/re-cert  Abnormal posture - Plan: PT plan of care cert/re-cert  Muscle weakness (generalized) - Plan: PT plan of care cert/re-cert     Problem List Patient Active Problem List   Diagnosis Date Noted  . Brain atrophy 05/24/2016  . Chronic headache 05/24/2016  . Lacunar stroke 01/20/2016  . Memory loss 01/20/2016  . Other fatigue 01/20/2016  . CAD (coronary artery disease) of artery bypass graft 01/20/2016  . Depression 01/20/2016  . Right lateral epicondylitis 12/16/2015  . 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 Lyme disease 12/16/2015  . Hyperlipidemia 09/25/2015  . Myopia of both eyes with astigmatism and presbyopia 05/21/2015  . Vitreous  syneresis of both eyes 05/21/2015  . Combined form of age-related cataract, left eye 05/21/2015  . Surgery, elective 05/06/2015  . CAD (coronary artery disease) 11/19/2014  . Diabetes mellitus type I ([place]) 11/19/2014  . DDD (degenerative disc disease), cervical 03/11/2014  . Vitreous hemorrhage of left eye ([place]) 03/03/2014  . DDD (degenerative disc disease), thoracic 01/07/2014  . Anemia due to stage 3 chronic kidney disease ([place]) 12/16/2013  . Osteoporosis 11/14/2013  . Chronic diastolic heart failure ([place]) 09/18/2013  . Cataract due to secondary diabetes ([place]) 08/26/2013  . 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 Hypothyroidism due to acquired atrophy of thyroid 08/02/2013  . Cerebral artery occlusion with cerebral infarction ([place]) 07/14/2013  . History of diabetic gastroparesis 04/09/2013  . Irritable bowel syndrome with diarrhea 04/09/2013  . Anemia of chronic disease 04/08/2013  . 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

## 2017-01-13 NOTE — Patient Instructions (Addendum)
  Decompression Exercise: Basic   Lie on back on firm surface, knees bent, feet flat, arms turned up, out to sides, backs of hands down. Time _3-5__ minutes. Surface: floor  Shoulder Press   Press both shoulders down. Hold _2-3__ seconds. Repeat _10__ times. Once a day.  Surface: floor   Head Press With Chin Tuck   Tuck chin SLIGHTLY toward chest, keep mouth closed. Feel weight on back of head. Increase weight by pressing head down. Hold __2-3_ seconds. Relax. Repeat _10__ times. Surface: floor   Leg Lengthener: Full   Straighten one leg. Pull toes AND forefoot toward knee, extend heel. Lengthen leg by pulling pelvis away from ribs. Hold _2-3__ seconds. Relax. Repeat 10 times. Re-bend knee. Do other leg. Once a day. Surface: floor   Leg Press: Single   Straighten one leg down to floor. Bring toes AND forefoot toward knee, extend heel. Press leg down. DO NOT BEND KNEE. Hold _2-3__ seconds. Relax leg. Repeat exercise 10 times. Relax leg. Once a day.   [facility] [phone] Log Roll    Lying on back, bend left knee and place left arm across chest. Roll all in one movement to the right. Reverse to roll to the left. Always move as one unit.   TENS UNIT: This is helpful for muscle pain and spasm.   Search and Purchase a TENS 7000 2nd edition at www.tenspros.com. It should be less than $30.     TENS unit instructions: Do not shower or bathe with the unit on Turn the unit off before removing electrodes or batteries If the electrodes lose stickiness add a drop of water to the electrodes after they are disconnected from the unit and place on plastic sheet. If you continued to have difficulty, call the TENS unit company to purchase more electrodes. Do not apply lotion on the skin area prior to use. Make sure the skin is clean and dry as this will help prolong the life of the electrodes. After use, always check skin for unusual red areas, rash or other skin difficulties.  If there are any skin problems, does not apply electrodes to the same area. Never remove the electrodes from the unit by pulling the wires. Do not use the TENS unit or electrodes other than as directed. Do not change electrode placement without consultating your therapist or physician. Keep 2 fingers with between each electrode. Wear time ratio is 2:1, on to off times.    For example on for 30 minutes off for 15 minutes and then on for 30 minutes off for 15 minutes

## 2017-01-16 ENCOUNTER — Ambulatory Visit (INDEPENDENT_AMBULATORY_CARE_PROVIDER_SITE_OTHER): Payer: BLUE CROSS/BLUE SHIELD

## 2017-01-16 DIAGNOSIS — M5412 Radiculopathy, cervical region: Secondary | ICD-10-CM

## 2017-01-16 DIAGNOSIS — M542 Cervicalgia: Secondary | ICD-10-CM

## 2017-01-16 DIAGNOSIS — M4312 Spondylolisthesis, cervical region: Secondary | ICD-10-CM

## 2017-01-25 ENCOUNTER — Encounter: Payer: BLUE CROSS/BLUE SHIELD | Admitting: Rehabilitative and Restorative Service Providers"

## 2017-01-27 ENCOUNTER — Encounter: Payer: BLUE CROSS/BLUE SHIELD | Admitting: Rehabilitative and Restorative Service Providers"

## 2017-02-01 ENCOUNTER — Encounter: Payer: Self-pay | Admitting: Family Medicine

## 2017-02-01 ENCOUNTER — Ambulatory Visit (INDEPENDENT_AMBULATORY_CARE_PROVIDER_SITE_OTHER): Payer: BLUE CROSS/BLUE SHIELD | Admitting: Family Medicine

## 2017-02-01 ENCOUNTER — Ambulatory Visit (INDEPENDENT_AMBULATORY_CARE_PROVIDER_SITE_OTHER): Payer: BLUE CROSS/BLUE SHIELD | Admitting: Physical Therapy

## 2017-02-01 VITALS — BP 138/76 | HR 73 | Wt 98.0 lb

## 2017-02-01 DIAGNOSIS — M6281 Muscle weakness (generalized): Secondary | ICD-10-CM | POA: Diagnosis not present

## 2017-02-01 DIAGNOSIS — G8929 Other chronic pain: Secondary | ICD-10-CM | POA: Diagnosis not present

## 2017-02-01 DIAGNOSIS — M542 Cervicalgia: Secondary | ICD-10-CM | POA: Diagnosis not present

## 2017-02-01 DIAGNOSIS — R293 Abnormal posture: Secondary | ICD-10-CM

## 2017-02-01 NOTE — Therapy (Signed)
[place], [place], [address] Phone: [phone]   Fax:  [phone]  Physical Therapy Treatment  Patient Details  Name: Brittney Pace MRN: [id number] Date of Birth: May 19, 1955 Referring Provider: Dr. Lynne Leader   Encounter Date: 02/01/2017  PT End of Session - 02/01/17 1109    Visit Number  2    Number of Visits  6    Date for PT Re-Evaluation  02/24/17    PT Start Time  1108 pt arrived late    PT Stop Time  1206    PT Time Calculation (min)  58 min    Activity Tolerance  Patient tolerated treatment well;No increased pain    Behavior During Therapy  WFL for tasks assessed/performed       Past Medical History:  Diagnosis Date  . Anemia   . Arthritis   . 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 Chronic kidney disease    stage 3  . Coronary artery disease   . Depression   . Diabetes mellitus without complication (HCC)    Type 1  . Diabetic retinopathy ([place])   . Family history of adverse reaction to anesthesia    mother: " while she was under she stopped breathing."  . Fibromyalgia   . Gastroparesis   . GERD (gastroesophageal reflux disease)   . Headache    migraines  . Hypothyroidism   . IBS (irritable bowel syndrome)   . Idiopathic edema   . Lyme disease   . Mitral valve prolapse   . Myocardial infarction ([place])    1 major in 1999 and 2 minor " small vessel disease."  . Osteoporosis   . Peripheral neuropathy   . Peripheral vascular disease ([place])   . 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 Stroke [facility])    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" " the second stroke was a lacunar     Past Surgical History:  Procedure Laterality Date  . ABDOMINAL HYSTERECTOMY    . APPENDECTOMY    . BREAST SURGERY     B/L biopsy and lumpectomy   . CARDIAC CATHETERIZATION    . CARPAL TUNNEL RELEASE    . CATARACT EXTRACTION W/ INTRAOCULAR LENS IMPLANT     right eye  .  COLONOSCOPY W/ BIOPSIES AND POLYPECTOMY    . CORONARY ARTERY BYPASS GRAFT    . coronary artery stents     at [facility]  . ECTOPIC PREGNANCY SURGERY    . NASAL SEPTUM SURGERY    . OPEN REDUCTION INTERNAL FIXATION (ORIF) DISTAL RADIAL FRACTURE Right 05/06/2015   Procedure: OPEN REDUCTION INTERNAL FIXATION (ORIF) RIGHT DISTAL RADIAL FRACTURE AND REPAIRS AS NEEDED;  Surgeon: Iran Planas, MD;  Location: [place];  Service: Orthopedics;  Laterality: Right;  . ORIF WRIST FRACTURE Left 11/05/2014  . ORIF WRIST FRACTURE Left 11/05/2014   Procedure: OPEN REDUCTION INTERNAL FIXATION (ORIF) LEFT WRIST FRACTURE AND REPAIR AS INDICATED;  Surgeon: Iran Planas, MD;  Location: [place];  Service: Orthopedics;  Laterality: Left;  . TRIGGER FINGER RELEASE      There were no vitals filed for this visit.  Subjective Assessment - 02/01/17 1109    Subjective  Pt reports she is feeling a little better than last visit.  "I think it is out of sheer will that it is better".  She takes the flexoril at night.  She has not completed any  of the exercises since last visit.  She has been busy preparing to move.     Pertinent History  osteoporosis, h/o CVA, chronic lymes dz,  - hasn't been to exercise for 10 yrs as it flares up her lymes dz. stage III kidney dz, anemia. pt reports she has a "crack " in her thoracic spine at the bra level since 2009    Patient Stated Goals  not sure, has never had PT for her neck and doesn't know what we can do.     Currently in Pain?  Yes    Pain Score  7     Pain Location  Neck    Pain Orientation  Left;Right    Pain Descriptors / Indicators  Aching    Aggravating Factors   moving too fast.     Pain Relieving Factors  heat, muscle relaxers          [facility] PT Assessment - 02/01/17 0001      Assessment   Medical Diagnosis  cervicalgia     Referring Provider  Dr. Lynne Leader    Onset Date/Surgical Date  08/13/16    Hand Dominance  Right    Next MD Visit  PRN    Prior Therapy  not PT       AROM   Cervical - Right Rotation  56    Cervical - Left Rotation  65        [facility] Adult PT Treatment/Exercise - 02/01/17 0001      Neck Exercises: Seated   Other Seated Exercise  sit to/from supine via log roll reviewed with heavy cues when transitioning to supine x 3 reps       Neck Exercises: Supine   Other Supine Exercise  MEEKS - shoulder press x 5 sec, x 10 reps; chin tuck with head press x 5 sec x 10 reps; leg lengther x 5 sec x 10 reps each leg; leg lengthener with same side arm overhead x 5 reps each side.  MHP under back during exercise    Other Supine Exercise  overhead pull with yellow band x 10 reps; bilat shoulder ER with yellow band x 8 reps; sash with yellow band x 8 reps each arm;  snow angel with prolonged pause with arms at 90 deg x 3 reps      Modalities   Modalities  Electrical Stimulation;Moist Heat      Moist Heat Therapy   Number Minutes Moist Heat  15 Minutes    Moist Heat Location  Cervical thoracic      Electrical Stimulation   Electrical Stimulation Location  bilat upper trap/ bilat thoracic paraspinals (under bra strap)    Electrical Stimulation Action  IFC    Electrical Stimulation Parameters  to tolerance     Electrical Stimulation Goals  Pain             PT Education - 02/01/17 1204    Education provided  Yes    Education Details  HEP    Person(s) Educated  Patient    Methods  Explanation;Demonstration;Handout;Verbal cues    Comprehension  Verbalized understanding;Returned demonstration          PT Long Term Goals - 02/01/17 1128      PT LONG TERM GOAL #1   Title  I with HEP ( 02/24/17)     Time  6    Period  Weeks    Status  On-going      PT LONG TERM GOAL #  2   Title  report =/> 50% reduction in neck pain with daily activity ( 02/24/17)     Period  Weeks    Status  On-going      PT LONG TERM GOAL #3   Title  improve FOTO =/< 44% limitations ( 02/24/17)     Time  6    Period  Weeks    Status  On-going      PT LONG TERM  GOAL #4   Title  improve cervical rotation =/> 75 degrees bilat ( 02/24/17)     Time  6    Period  Weeks    Status  On-going improving      PT LONG TERM GOAL #5   Title  report improvement in sleep =/> 25% ( 02/24/17)     Time  6    Period  Weeks    Status  On-going unchanged since last visit.             Plan - 02/01/17 1142    Clinical Impression Statement  Pt demonstrated improved neck rotation ROM. She tolerated all exercises well, without increased pain.  time spent educating pt on rationale of M[place]ks exercises and postural strengthening, along with starting slow with exercises (ie:5 reps per set).Pt reported pain reduction to 3/10 at end of session.  Progressing gradually towards goals.     PT Frequency  1x / week    PT Duration  6 weeks    PT Treatment/Interventions  Manual techniques;Moist Heat;Traction;Ultrasound;Electrical Stimulation;Cryotherapy;Passive range of motion;Taping;Patient/family education;Therapeutic exercise    PT Next Visit Plan  assess tolerance to HEP, slowly add in posterior body strengthening and RTC strengthening, attempt manual work to neck and upper shoulders, modalties PRN    Consulted and Agree with Plan of Care  Patient       Patient will benefit from skilled therapeutic intervention in order to improve the following deficits and impairments:  Pain, Improper body mechanics, Postural dysfunction, Increased muscle spasms, Decreased range of motion, Decreased strength, Impaired UE functional use  Visit Diagnosis: Cervicalgia  Abnormal posture  Muscle weakness (generalized)     Problem List Patient Active Problem List   Diagnosis Date Noted  . Brain atrophy 05/24/2016  . Chronic headache 05/24/2016  . Lacunar stroke 01/20/2016  . Memory loss 01/20/2016  . Other fatigue 01/20/2016  . CAD (coronary artery disease) of artery bypass graft 01/20/2016  . Depression 01/20/2016  . Right lateral epicondylitis 12/16/2015  . 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  Lyme disease 12/16/2015  . Hyperlipidemia 09/25/2015  . Myopia of both eyes with astigmatism and presbyopia 05/21/2015  . Vitreous syneresis of both eyes 05/21/2015  . Combined form of age-related cataract, left eye 05/21/2015  . Surgery, elective 05/06/2015  . CAD (coronary artery disease) 11/19/2014  . Diabetes mellitus type I ([place]) 11/19/2014  . DDD (degenerative disc disease), cervical 03/11/2014  . Vitreous hemorrhage of left eye ([place]) 03/03/2014  . DDD (degenerative disc disease), thoracic 01/07/2014  . Anemia due to stage 3 chronic kidney disease ([place]) 12/16/2013  . Osteoporosis 11/14/2013  . Chronic diastolic heart failure ([place]) 09/18/2013  . Cataract due to secondary diabetes ([place]) 08/26/2013  . 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 associated with type 2 diabetes mellitus ([place]) 08/26/2013  . Hypothyroidism due to acquired atrophy of thyroid 08/02/2013  . Cerebral artery occlusion with cerebral infarction ([place]) 07/14/2013  . History of diabetic gastroparesis 04/09/2013  . Irritable bowel syndrome with diarrhea 04/09/2013  .  Anemia of chronic disease 04/08/2013  . 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

## 2017-02-01 NOTE — Patient Instructions (Signed)
Thank you for coming in today. Continue PT as needed.  Continue exercises.  We can do injection as needed but I would like to coordinate with your Endocrinologist ahead of time to prevent diabetes flair.

## 2017-02-01 NOTE — Patient Instructions (Signed)
Over Head Pull: Narrow Grip     [place] [phone]   On back, knees bent, feet flat, band across thighs, elbows straight but relaxed. Pull hands apart (start). Keeping elbows straight, bring arms up and over head, hands toward floor. Keep pull steady on band. Hold momentarily. Return slowly, keeping pull steady, back to start. Repeat _5-10__ times. Band color __yellow____   Sash   On back, knees bent, feet flat, left hand on left hip, right hand above left. Pull right arm DIAGONALLY (hip to shoulder) across chest. Bring right arm along head toward floor. Hold momentarily. Slowly return to starting position. Repeat _5-10_ times. Do with left arm. Band color __yellow____   Shoulder Rotation: Double Arm   On back, knees bent, feet flat, elbows tucked at sides, bent 90, hands palms up. Pull hands apart and down toward floor, keeping elbows near sides. Hold momentarily. Slowly return to starting position. Repeat _5-10__ times. Band color ___yellow___    [facility] [place], [place] [address]  [phone] (office) [phone] (fax)

## 2017-02-01 NOTE — Progress Notes (Signed)
Brittney Pace is a 62 y.o. female who presents to [facility] today for follow-up chronic neck pain.  Brittney Pace was seen about a month ago for chronic neck pain with some radicular symptoms.  She had an MRI in the interim for potential injection planning and was referred to physical therapy.  Fortunately she had significant improvement in symptoms with physical therapy.  She continues to experience some neck pain but overall is dramatically improved.  She denies any significant radicular symptoms.   Past Medical History:  Diagnosis Date  . Anemia   . Arthritis   . 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 Chronic kidney disease    stage 3  . Coronary artery disease   . Depression   . Diabetes mellitus without complication (HCC)    Type 1  . Diabetic retinopathy ([place])   . Family history of adverse reaction to anesthesia    mother: " while she was under she stopped breathing."  . Fibromyalgia   . Gastroparesis   . GERD (gastroesophageal reflux disease)   . Headache    migraines  . Hypothyroidism   . IBS (irritable bowel syndrome)   . Idiopathic edema   . Lyme disease   . Mitral valve prolapse   . Myocardial infarction ([place])    1 major in 1999 and 2 minor " small vessel disease."  . Osteoporosis   . Peripheral neuropathy   . Peripheral vascular disease ([place])   . 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 ABDOMINAL HYSTERECTOMY    . APPENDECTOMY    . BREAST SURGERY     B/L biopsy and lumpectomy   . CARDIAC CATHETERIZATION    . CARPAL TUNNEL RELEASE    . CATARACT EXTRACTION W/ INTRAOCULAR LENS IMPLANT     right eye  . COLONOSCOPY W/ BIOPSIES AND POLYPECTOMY    . CORONARY ARTERY BYPASS GRAFT    . coronary artery stents     at [facility]  . ECTOPIC PREGNANCY  SURGERY    . NASAL SEPTUM SURGERY    . OPEN REDUCTION INTERNAL FIXATION (ORIF) DISTAL RADIAL FRACTURE Right 05/06/2015   Procedure: OPEN REDUCTION INTERNAL FIXATION (ORIF) RIGHT DISTAL RADIAL FRACTURE AND REPAIRS AS NEEDED;  Surgeon: Iran Planas, MD;  Location: [place];  Service: Orthopedics;  Laterality: Right;  . ORIF WRIST FRACTURE Left 11/05/2014  . ORIF WRIST FRACTURE Left 11/05/2014   Procedure: OPEN REDUCTION INTERNAL FIXATION (ORIF) LEFT WRIST FRACTURE AND REPAIR AS INDICATED;  Surgeon: Iran Planas, MD;  Location: [place];  Service: Orthopedics;  Laterality: Left;  . TRIGGER FINGER RELEASE     Social History   Tobacco Use  . Smoking status: Former Smoker    Types: Cigarettes  . Smokeless tobacco: Never Used  . Tobacco comment:  " Quit smoking cigarettes in 20's "  Substance Use Topics  . Alcohol use: Yes    Comment: occasional beer or wine     ROS:  As above   Medications: Current Outpatient Medications  Medication Sig Dispense Refill  . acyclovir (ZOVIRAX) 400 MG tablet Take 1 tablet (400 mg total) by mouth 2 (two) times daily. 180 tablet 3  . Alpha-Lipoic Acid 600 MG CAPS Take 600 mg by mouth 2 (  two) times daily.     Marland Kitchen arginine 500 MG tablet Take 500 mg by mouth daily.    . Armodafinil 250 MG tablet Take 1/2 to 1 pill in the mornings 30 tablet 5  . aspirin EC 325 MG tablet Take 325 mg by mouth at bedtime.    . carvedilol (COREG) 3.125 MG tablet Take 1 tablet (3.125 mg total) by mouth 2 (two) times daily with a meal. 180 tablet 3  . cetirizine (ZYRTEC) 10 MG tablet Take 10 mg by mouth at bedtime.    . Cholecalciferol (VITAMIN D3) 5000 UNITS TABS Take 1,000 Units by mouth daily.     . Cyanocobalamin (B-12) 1000 MCG CAPS Take 1,000 mcg by mouth daily. 90 capsule 3  . cyclobenzaprine (FLEXERIL) 10 MG tablet Take 0.5-1 tablets (5-10 mg total) by mouth 3 (three) times daily as needed for muscle spasms. Caution: can cause drowsiness 60 tablet 1  . diclofenac sodium (VOLTAREN) 1  % GEL Apply 2 g topically 4 (four) times daily. To affected joint. 100 g 3  . Esomeprazole Magnesium (NEXIUM PO) Take 20 mg by mouth every morning.     . fluticasone (FLONASE) 50 MCG/ACT nasal spray Place 2 sprays into both nostrils daily. 16 g 11  . Insulin Human (INSULIN PUMP) SOLN Inject into the skin continuous. Novolog insulin    . isosorbide mononitrate (IMDUR) 120 MG 24 hr tablet Take 1 tablet (120 mg total) by mouth at bedtime. 90 tablet 3  . ketorolac (TORADOL) 10 MG tablet Take 1 tablet (10 mg total) by mouth every 8 (eight) hours as needed for severe pain. 12 tablet 0  . levothyroxine (SYNTHROID, LEVOTHROID) 25 MCG tablet Take 1 tablet (25 mcg total) by mouth daily before breakfast. 90 tablet 3  . Linoleic Acid Conjugated 1000 MG CAPS Take 1,000 mg by mouth daily.     . Methylsulfonylmethane (MSM) 1000 MG TABS Take by mouth. 1-4 times a day    . mupirocin cream (BACTROBAN) 2 % Apply 1 application topically 3 (three) times daily. 30 g 0  . NATURE-THROID 32.5 MG tablet Take 1 tablet (32.5 mg total) daily by mouth. 90 tablet 1  . nitroGLYCERIN (NITROLINGUAL) 0.4 MG/SPRAY spray Place 1 spray under the tongue every 5 (five) minutes x 3 doses as needed for chest pain. 12 g 1  . NON FORMULARY Chloroella    . OVER THE COUNTER MEDICATION Take 750 mg by mouth at bedtime. GABA    . OVER THE COUNTER MEDICATION as directed. Magnesium gluconate    . OVER THE COUNTER MEDICATION as directed. Omega EPA/DHA    . OVER THE COUNTER MEDICATION as directed. Grape fruit seed extract    . potassium chloride (K-DUR,KLOR-CON) 10 MEQ tablet Take 1 tablet (10 mEq total) by mouth 2 (two) times daily. 180 tablet 3  . promethazine (PHENERGAN) 25 MG tablet Take 1 tablet (25 mg total) by mouth every 6 (six) hours as needed for nausea or vomiting. 30 tablet 2  . torsemide ([name]) 20 MG tablet TAKE ONE TABLET BY MOUTH ONCE DAILY IN THE MORNING.  MAY  TAKE  AN  EXTRA  ONE-HALF  TABLET  AS  NEEDED 135 tablet 3  . vitamin  C (ASCORBIC ACID) 500 MG tablet Take 1 tablet (500 mg total) by mouth daily. 50 tablet 0  . VITAMIN K PO Take by mouth as directed.     No current facility-administered medications for this visit.    Allergies  Allergen Reactions  .  Dilaudid [Hydromorphone] Other (See Comments)    Because of continuous glucose monitor  . Morphine Anaphylaxis and Shortness Of Breath  . Morphine And Related Shortness Of Breath  . Penicillins Hives    Has patient had a PCN reaction causing immediate rash, facial/tongue/throat swelling, SOB or lightheadedness with hypotension: Yes Has patient had a PCN reaction causing severe rash involving mucus membranes or skin necrosis: No Has patient had a PCN reaction that required hospitalization No Has patient had a PCN reaction occurring within the last 10 years: No If all of the above answers are "NO", then may proceed with Cephalosporin use.  . Tramadol Anaphylaxis  . Amitriptyline Other (See Comments)    Severe headache/ out of body feeling  . Codeine Other (See Comments)    Severe headaches/ out of body feeling  . Darvon [Propoxyphene] Other (See Comments)    Severe headache Severe headache Severe headaches / out of body feeling  . Other Swelling and Other (See Comments)    Cats itching Mold sinuses Cats itching Mold sinuses Cat hair and scratches cause swelling  . Statins Other (See Comments)    Muscle pains  . Sulfamethoxazole Other (See Comments)  . Acetaminophen Other (See Comments)    Alters insulin pump readings Alters insulin pump readings  . Advil [Ibuprofen] Other (See Comments)    Messes up CGM reading on glucose monitor  . Gluten Meal Diarrhea and Nausea And Vomiting    Cramping Cramping  . Mold Extract [Trichophyton] Other (See Comments)    sinusitis  . Sulfa Antibiotics Other (See Comments)    Mouth ulcers  . Sulfites Other (See Comments)    Mouth ulcers  . Ultracet [Tramadol-Acetaminophen] Other (See Comments)    Small vessel  heart attack  . Wheat Bran Diarrhea and Nausea And Vomiting    Cramping Cramping     Exam:  BP 138/76   Pulse 73   Wt 98 lb (44.5 kg)   BMI 19.30 kg/m  General: Well Developed, well nourished, and in no acute distress.  Neuro/Psych: Alert and oriented x3, extra-ocular muscles intact, able to move all 4 extremities, sensation grossly intact. Skin: Warm and dry, no rashes noted.  Respiratory: Not using accessory muscles, speaking in full sentences, trachea midline.  Cardiovascular: Pulses palpable, no extremity edema. Abdomen: Does not appear distended. MSK: C-spine decreased motion however negative Spurling's test.  CLINICAL DATA:  Chronic neck pain since July, no injury. Pain radiates to shoulders. Headaches.  EXAM: MRI CERVICAL SPINE WITHOUT CONTRAST  TECHNIQUE: Multiplanar, multisequence MR imaging of the cervical spine was performed. No intravenous contrast was administered.  COMPARISON:  Cervical spine radiographs December 27, 2016  FINDINGS: Multiple sequences are mildly or moderately motion degraded examination.  ALIGNMENT: Maintained cervical lordosis. Grade 1 C4-5 anterolisthesis.  VERTEBRAE/DISCS: Vertebral bodies are intact. Severe C3-4, C5-6 and C6-7 disc height loss, moderate at C4-5. Moderate C4-5 and C5-6 acute discogenic endplate changes and general C5 and C6 bone marrow edema. Mild more chronic discogenic endplate changes Z3-2. Bright STIR signal RIGHT greater than LEFT midcervical facets associated with arthropathy.  CORD:Cervical spinal cord is normal in signal though, motion limits sensitivity for subtle parenchymal signal abnormality. No syrinx.  POSTERIOR FOSSA, VERTEBRAL ARTERIES, PARASPINAL TISSUES: No MR findings of ligamentous injury. Vertebral artery flow voids present. Mild bright interstitial STIR signal RIGHT paraspinal muscles compatible with low-grade strain.  DISC LEVELS:  C2-3: No disc bulge, canal stenosis nor neural  foraminal narrowing. Mild bilateral facet arthropathy.  C3-4: Small broad-based  disc bulge, uncovertebral hypertrophy and moderate facet arthropathy, facets are likely fused. No canal stenosis or neural foraminal narrowing.  C4-5: Anterolisthesis. Small broad-based disc bulge, uncovertebral hypertrophy. Mild RIGHT, severe LEFT facet arthropathy and edema. Mild canal stenosis. Mild LEFT neural foraminal narrowing.  C5-6: Small broad-based disc bulge, uncovertebral hypertrophy. Severe RIGHT and at least moderate LEFT facet arthropathy. Mild canal stenosis. Severe RIGHT neural foraminal narrowing.  C6-7: Small broad-based disc bulge, uncovertebral hypertrophy. Mild canal stenosis. Minimal neural foraminal narrowing.  C7-T1: No disc bulge, canal stenosis nor neural foraminal narrowing.  IMPRESSION: 1. Motion degraded examination. 2. Grade 1 C4-5 anterolisthesis. Associated severe C4-5 and C5-6 facet arthropathy and acute inflammatory changes. 3. Mild canal stenosis C4-5 and C5-6. 4. Neural foraminal narrowing C4-5 through C6-7: Severe on the RIGHT at C5-6.   Electronically Signed   By: Elon Alas M.D.   On: 01/16/2017 21:06   No results found for this or any previous visit (from the past 48 hour(s)). No results found.    Assessment and Plan: 62 y.o. female with resolving neck pain.  Patient continues to have significant degenerative changes in her cervical spine.  Plan to continue home exercise program and physical therapy as needed.  Will consider epidural or facet injections as needed.    No orders of the defined types were placed in this encounter.  No orders of the defined types were placed in this encounter.   Discussed warning signs or symptoms. Please see discharge instructions. Patient expresses understanding.  I spent 15 minutes with this patient, greater than 50% was face-to-face time counseling regarding reviewing MRI findings discussing  treatment options and plan.

## 2017-02-10 ENCOUNTER — Telehealth: Payer: Self-pay | Admitting: Physical Therapy

## 2017-02-10 ENCOUNTER — Ambulatory Visit (INDEPENDENT_AMBULATORY_CARE_PROVIDER_SITE_OTHER): Payer: BLUE CROSS/BLUE SHIELD | Admitting: Physical Therapy

## 2017-02-10 DIAGNOSIS — R293 Abnormal posture: Secondary | ICD-10-CM

## 2017-02-10 DIAGNOSIS — M6281 Muscle weakness (generalized): Secondary | ICD-10-CM

## 2017-02-10 DIAGNOSIS — M542 Cervicalgia: Secondary | ICD-10-CM | POA: Diagnosis not present

## 2017-02-10 NOTE — Telephone Encounter (Signed)
Patient was scheduled for 10:15, message left as she missed appointment.     Pt showed up as this note was being written.  15 minutes late.

## 2017-02-10 NOTE — Therapy (Signed)
[facility] [place] [place], [place], [address] Phone: [phone]   Fax:  [phone]  Physical Therapy Treatment  Patient Details  Name: Brittney Pace MRN: [id number] Date of Birth: July 16, 1955 Referring Provider: Dr Georgina Snell   Encounter Date: 02/10/2017  PT End of Session - 02/10/17 1059    Visit Number  3    Number of Visits  6    Date for PT Re-Evaluation  02/24/17    PT Start Time  1031 pt in late    PT Stop Time  1124    PT Time Calculation (min)  53 min    Activity Tolerance  Patient tolerated treatment well       Past Medical History:  Diagnosis Date  . Anemia   . Arthritis   . 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 Chronic kidney disease    stage 3  . Coronary artery disease   . Depression   . Diabetes mellitus without complication (HCC)    Type 1  . Diabetic retinopathy ([place])   . Family history of adverse reaction to anesthesia    mother: " while she was under she stopped breathing."  . Fibromyalgia   . Gastroparesis   . GERD (gastroesophageal reflux disease)   . Headache    migraines  . Hypothyroidism   . IBS (irritable bowel syndrome)   . Idiopathic edema   . Lyme disease   . Mitral valve prolapse   . Myocardial infarction ([place])    1 major in 1999 and 2 minor " small vessel disease."  . Osteoporosis   . Peripheral neuropathy   . Peripheral vascular disease ([place])   . 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 Stroke [facility])    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" " the second stroke was a lacunar     Past Surgical History:  Procedure Laterality Date  . ABDOMINAL HYSTERECTOMY    . APPENDECTOMY    . BREAST SURGERY     B/L biopsy and lumpectomy   . CARDIAC CATHETERIZATION    . CARPAL TUNNEL RELEASE    . CATARACT EXTRACTION W/ INTRAOCULAR LENS IMPLANT     right eye  . COLONOSCOPY W/ BIOPSIES AND POLYPECTOMY    . CORONARY ARTERY BYPASS GRAFT    . coronary  artery stents     at [facility] and [facility]  . ECTOPIC PREGNANCY SURGERY    . NASAL SEPTUM SURGERY    . OPEN REDUCTION INTERNAL FIXATION (ORIF) DISTAL RADIAL FRACTURE Right 05/06/2015   Procedure: OPEN REDUCTION INTERNAL FIXATION (ORIF) RIGHT DISTAL RADIAL FRACTURE AND REPAIRS AS NEEDED;  Surgeon: Iran Planas, MD;  Location: [place];  Service: Orthopedics;  Laterality: Right;  . ORIF WRIST FRACTURE Left 11/05/2014  . ORIF WRIST FRACTURE Left 11/05/2014   Procedure: OPEN REDUCTION INTERNAL FIXATION (ORIF) LEFT WRIST FRACTURE AND REPAIR AS INDICATED;  Surgeon: Iran Planas, MD;  Location: [place];  Service: Orthopedics;  Laterality: Left;  . TRIGGER FINGER RELEASE      There were no vitals filed for this visit.  Subjective Assessment - 02/10/17 1032    Subjective  Pt reports she hasn't been able to do anything except sleep as she is still recovering from her hospital stay in Dec. She hasn't performed any of her HEP . She saw the doctor and he told her she doesn't have to come back anymore.     Pertinent History  osteoporosis, h/o CVA, chronic lymes dz,  - hasn't been to exercise for 10 yrs as it flares up her lymes dz. stage III kidney dz, anemia. pt reports she has a "crack " in her thoracic spine at the bra level since 2009    Patient Stated Goals  not sure, has never had PT for her neck and doesn't know what we can do.     Currently in Pain?  Yes    Pain Score  4     Pain Location  Neck    Pain Orientation  Right;Left    Pain Descriptors / Indicators  Aching    Pain Type  Chronic pain    Pain Onset  More than a month ago         [facility] PT Assessment - 02/10/17 0001      Assessment   Medical Diagnosis  cervicalgia     Referring Provider  Dr Georgina Snell    Onset Date/Surgical Date  08/13/16      Observation/Other Assessments   Focus on Therapeutic Outcomes (FOTO)   56% limited      AROM   AROM Assessment Site  Cervical    Cervical Flexion  WNL    Cervical Extension  34, with pulling in Rt  shoulder    Cervical - Right Side Bend  34    Cervical - Left Side Bend  38    Cervical - Right Rotation  55    Cervical - Left Rotation  54      Strength   Right/Left Shoulder  -- WNL, except ER Lt 4+/5, Rt 5-/5                  [facility] Adult PT Treatment/Exercise - 02/10/17 0001      Self-Care   Self-Care  Other Self-Care Comments    Other Self-Care Comments   trigger point release to cervical and thoracic paraspinals.       Neck Exercises: Seated   Other Seated Exercise  pulleys for shoulder flex and scaption      Neck Exercises: Supine   Other Supine Exercise  10 reps, yellow band, overhead pull, horizontal abduction, SASH and ER reviewed and gave VC for form.  Rt shoulder SASH had some discomfort - VC for form      Modalities   Modalities  Electrical Stimulation;Moist Heat      Moist Heat Therapy   Number Minutes Moist Heat  20 Minutes    Moist Heat Location  Cervical thoracic      Electrical Stimulation   Electrical Stimulation Location  bilat upper trap/ bilat thoracic paraspinals (under bra strap)    Electrical Stimulation Action  IFC    Electrical Stimulation Parameters  to tolerance    Electrical Stimulation Goals  Pain                  PT Long Term Goals - 02/10/17 1041      PT LONG TERM GOAL #1   Title  I with HEP ( 02/24/17)     Status  Achieved      PT LONG TERM GOAL #2   Title  report =/> 50% reduction in neck pain with daily activity ( 02/24/17)     Status  Achieved 50-80% improved per pt      PT LONG TERM GOAL #3   Title  improve FOTO =/< 44% limitations ( 02/24/17)     Status  Not Met scored 56% limited  PT LONG TERM GOAL #4   Title  improve cervical rotation =/> 75 degrees bilat ( 02/24/17)     Status  Not Met      PT LONG TERM GOAL #5   Title  report improvement in sleep =/> 25% ( 02/24/17)     Status  Not Met no improvement per patient            Plan - 02/10/17 1059    Clinical Impression Statement  Pt reports she  has a lot of improvement in overall pain reduction.  Her FOTO score does not reflect her reported improvement.  Her cervical ROM has improved since initial session.  She is pleased with her progress and her MD said if she is good she can be finished with PT . Goals partially met.     PT Next Visit Plan  discharge to HEP, heat and TENS    Consulted and Agree with Plan of Care  Patient       Patient will benefit from skilled therapeutic intervention in order to improve the following deficits and impairments:     Visit Diagnosis: Muscle weakness (generalized)  Abnormal posture  Cervicalgia     Problem List Patient Active Problem List   Diagnosis Date Noted  . Brain atrophy 05/24/2016  . Chronic headache 05/24/2016  . Lacunar stroke 01/20/2016  . Memory loss 01/20/2016  . Other fatigue 01/20/2016  . CAD (coronary artery disease) of artery bypass graft 01/20/2016  . Depression 01/20/2016  . Right lateral epicondylitis 12/16/2015  . 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 Lyme disease 12/16/2015  . Hyperlipidemia 09/25/2015  . Myopia of both eyes with astigmatism and presbyopia 05/21/2015  . Vitreous syneresis of both eyes 05/21/2015  . Combined form of age-related cataract, left eye 05/21/2015  . Surgery, elective 05/06/2015  . CAD (coronary artery disease) 11/19/2014  . Diabetes mellitus type I ([place]) 11/19/2014  . DDD (degenerative disc disease), cervical 03/11/2014  . Vitreous hemorrhage of left eye ([place]) 03/03/2014  . DDD (degenerative disc disease), thoracic 01/07/2014  . Anemia due to stage 3 chronic kidney disease ([place]) 12/16/2013  . Osteoporosis 11/14/2013  . Chronic diastolic heart failure ([place]) 09/18/2013  . Cataract due to secondary diabetes ([place]) 08/26/2013  . 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 Hypothyroidism due to acquired atrophy of thyroid 08/02/2013  . Cerebral artery occlusion  with cerebral infarction ([place]) 07/14/2013  . History of diabetic gastroparesis 04/09/2013  . Irritable bowel syndrome with diarrhea 04/09/2013  . Anemia of chronic disease 04/08/2013  . 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    Education / Equipment: HEP, home TENs Plan: Patient agrees to discharge.  Patient goals were partially met. Patient is being discharged due to being pleased with the current functional level.  ?????     Jeral Pinch, PT 02/10/17 12:26 PM

## 2017-02-14 ENCOUNTER — Ambulatory Visit (INDEPENDENT_AMBULATORY_CARE_PROVIDER_SITE_OTHER): Payer: BLUE CROSS/BLUE SHIELD | Admitting: Osteopathic Medicine

## 2017-02-14 ENCOUNTER — Encounter: Payer: Self-pay | Admitting: Osteopathic Medicine

## 2017-02-14 VITALS — BP 177/87 | HR 60 | Temp 97.6°F | Wt 95.1 lb

## 2017-02-14 DIAGNOSIS — R300 Dysuria: Secondary | ICD-10-CM

## 2017-02-14 DIAGNOSIS — Z87448 Personal history of other diseases of urinary system: Secondary | ICD-10-CM

## 2017-02-14 DIAGNOSIS — I152 Hypertension secondary to endocrine disorders: Secondary | ICD-10-CM | POA: Insufficient documentation

## 2017-02-14 DIAGNOSIS — N183 Chronic kidney disease, stage 3 unspecified: Secondary | ICD-10-CM

## 2017-02-14 DIAGNOSIS — E1022 Type 1 diabetes mellitus with diabetic chronic kidney disease: Secondary | ICD-10-CM

## 2017-02-14 DIAGNOSIS — R350 Frequency of micturition: Secondary | ICD-10-CM | POA: Diagnosis not present

## 2017-02-14 DIAGNOSIS — E1159 Type 2 diabetes mellitus with other circulatory complications: Secondary | ICD-10-CM | POA: Diagnosis not present

## 2017-02-14 DIAGNOSIS — I10 Essential (primary) hypertension: Secondary | ICD-10-CM

## 2017-02-14 LAB — POCT URINALYSIS DIPSTICK
Bilirubin, UA: NEGATIVE
Glucose, UA: 100
Ketones, UA: NEGATIVE
Nitrite, UA: NEGATIVE
Protein, UA: >=300
Spec Grav, UA: 1.015 (ref 1.010–1.025)
Urobilinogen, UA: 0.2 E.U./dL
pH, UA: 6 (ref 5.0–8.0)

## 2017-02-14 NOTE — Patient Instructions (Signed)
Plan:  Await urine culture   Call Dr Dorise Bullion office to confirm name of urologist she recommended   If can't get anywhere with Dr Dorise Bullion recommendation, will refer you to whatever urologist can see you soonest  Consider medications for urine urgency

## 2017-02-14 NOTE — Progress Notes (Signed)
HPI: Brittney Pace is a 62 y.o. female who  has a past medical history of Anemia, Arthritis, Babesiasis, Chronic kidney disease, Coronary artery disease, Depression, Diabetes mellitus without complication ([place]), Diabetic retinopathy ([place]), Family history of adverse reaction to anesthesia, Fibromyalgia, Gastroparesis, GERD (gastroesophageal reflux disease), Headache, Hypothyroidism, IBS (irritable bowel syndrome), Idiopathic edema, Lyme disease, Mitral valve prolapse, Myocardial infarction ([place]), Osteoporosis, Peripheral neuropathy, Peripheral vascular disease ([place]), Sinus disorder, and Stroke ([place]).  she presents to [facility] today, 02/14/17,  for chief complaint of: urinary concern    Catheterized in the hospital, 11/2016, ever since hasn't been urinating right. Occasional dried blood at urethra with wiping. Urgency few times per day and 2-3 times per night often resulting in incontinence.   Her nephrologist apparently had a urologist that they're going to refer her to, I don't see any appointment on the schedule but this may not be visible through other systems computer EHR  BP: stopped diuretics d/t concern for urinary frequency. No chest pain, pressure, shortness of breath.     Past medical, surgical, social and family history reviewed: No change needed    Current medication list and allergy/intolerance information reviewed:    Current Outpatient Medications  Medication Sig Dispense Refill  . acyclovir (ZOVIRAX) 400 MG tablet Take 1 tablet (400 mg total) by mouth 2 (two) times daily. 180 tablet 3  . Alpha-Lipoic Acid 600 MG CAPS Take 600 mg by mouth 2 (two) times daily.     Marland Kitchen arginine 500 MG tablet Take 500 mg by mouth daily.    . Armodafinil 250 MG tablet Take 1/2 to 1 pill in the mornings 30 tablet 5  . aspirin EC 325 MG tablet Take 325 mg by mouth at bedtime.    . carvedilol (COREG) 3.125 MG tablet Take 1 tablet (3.125 mg total) by mouth 2  (two) times daily with a meal. 180 tablet 3  . cetirizine (ZYRTEC) 10 MG tablet Take 10 mg by mouth at bedtime.    . Cholecalciferol (VITAMIN D3) 5000 UNITS TABS Take 1,000 Units by mouth daily.     . Cyanocobalamin (B-12) 1000 MCG CAPS Take 1,000 mcg by mouth daily. 90 capsule 3  . cyclobenzaprine (FLEXERIL) 10 MG tablet Take 0.5-1 tablets (5-10 mg total) by mouth 3 (three) times daily as needed for muscle spasms. Caution: can cause drowsiness 60 tablet 1  . diclofenac sodium (VOLTAREN) 1 % GEL Apply 2 g topically 4 (four) times daily. To affected joint. 100 g 3  . Esomeprazole Magnesium (NEXIUM PO) Take 20 mg by mouth every morning.     . fluticasone (FLONASE) 50 MCG/ACT nasal spray Place 2 sprays into both nostrils daily. 16 g 11  . Insulin Human (INSULIN PUMP) SOLN Inject into the skin continuous. Novolog insulin    . isosorbide mononitrate (IMDUR) 120 MG 24 hr tablet Take 1 tablet (120 mg total) by mouth at bedtime. 90 tablet 3  . ketorolac (TORADOL) 10 MG tablet Take 1 tablet (10 mg total) by mouth every 8 (eight) hours as needed for severe pain. 12 tablet 0  . levothyroxine (SYNTHROID, LEVOTHROID) 25 MCG tablet Take 1 tablet (25 mcg total) by mouth daily before breakfast. 90 tablet 3  . Linoleic Acid Conjugated 1000 MG CAPS Take 1,000 mg by mouth daily.     . Methylsulfonylmethane (MSM) 1000 MG TABS Take by mouth. 1-4 times a day    . mupirocin cream (BACTROBAN) 2 % Apply 1 application topically 3 (three) times  daily. 30 g 0  . NATURE-THROID 32.5 MG tablet Take 1 tablet (32.5 mg total) daily by mouth. 90 tablet 1  . nitroGLYCERIN (NITROLINGUAL) 0.4 MG/SPRAY spray Place 1 spray under the tongue every 5 (five) minutes x 3 doses as needed for chest pain. 12 g 1  . NON FORMULARY Chloroella    . OVER THE COUNTER MEDICATION Take 750 mg by mouth at bedtime. GABA    . OVER THE COUNTER MEDICATION as directed. Magnesium gluconate    . OVER THE COUNTER MEDICATION as directed. Omega EPA/DHA    . OVER  THE COUNTER MEDICATION as directed. Grape fruit seed extract    . potassium chloride (K-DUR,KLOR-CON) 10 MEQ tablet Take 1 tablet (10 mEq total) by mouth 2 (two) times daily. 180 tablet 3  . promethazine (PHENERGAN) 25 MG tablet Take 1 tablet (25 mg total) by mouth every 6 (six) hours as needed for nausea or vomiting. 30 tablet 2  . torsemide ([name]) 20 MG tablet TAKE ONE TABLET BY MOUTH ONCE DAILY IN THE MORNING.  MAY  TAKE  AN  EXTRA  ONE-HALF  TABLET  AS  NEEDED 135 tablet 3  . vitamin C (ASCORBIC ACID) 500 MG tablet Take 1 tablet (500 mg total) by mouth daily. 50 tablet 0  . VITAMIN K PO Take by mouth as directed.     No current facility-administered medications for this visit.     Allergies  Allergen Reactions  . Dilaudid [Hydromorphone] Other (See Comments)    Because of continuous glucose monitor  . Morphine Anaphylaxis and Shortness Of Breath  . Morphine And Related Shortness Of Breath  . Penicillins Hives    Has patient had a PCN reaction causing immediate rash, facial/tongue/throat swelling, SOB or lightheadedness with hypotension: Yes Has patient had a PCN reaction causing severe rash involving mucus membranes or skin necrosis: No Has patient had a PCN reaction that required hospitalization No Has patient had a PCN reaction occurring within the last 10 years: No If all of the above answers are "NO", then may proceed with Cephalosporin use.  . Tramadol Anaphylaxis  . Amitriptyline Other (See Comments)    Severe headache/ out of body feeling  . Codeine Other (See Comments)    Severe headaches/ out of body feeling  . Darvon [Propoxyphene] Other (See Comments)    Severe headache Severe headache Severe headaches / out of body feeling  . Other Swelling and Other (See Comments)    Cats itching Mold sinuses Cats itching Mold sinuses Cat hair and scratches cause swelling  . Statins Other (See Comments)    Muscle pains  . Sulfamethoxazole Other (See Comments)  .  Acetaminophen Other (See Comments)    Alters insulin pump readings Alters insulin pump readings  . Advil [Ibuprofen] Other (See Comments)    Messes up CGM reading on glucose monitor  . Mold Extract [Trichophyton] Other (See Comments)    sinusitis  . Sulfa Antibiotics Other (See Comments)    Mouth ulcers  . Sulfites Other (See Comments)    Mouth ulcers  . Ultracet [Tramadol-Acetaminophen] Other (See Comments)    Small vessel heart attack      Review of Systems:  Constitutional:  No  fever, no chills  Cardiac: No  chest pain, No  pressure, No palpitations, No  Orthopnea  Respiratory:  No  shortness of breath. No  Cough  Gastrointestinal: No  abdominal pain, No  nausea, No  vomiting  Musculoskeletal: No new myalgia/arthralgia  Genitourinary: +urge without  stress incontinence, +abnormal genital bleeding, No abnormal genital discharge  Skin: No  Rash   Exam:  BP (!) 161/87   Pulse 66   Temp 97.6 F (36.4 C) (Oral)   Wt 95 lb 1.9 oz (43.1 kg)   BMI 18.73 kg/m   Constitutional: VS see above. General Appearance: alert, well-developed, well-nourished, NAD  Eyes: Normal lids and conjunctive, non-icteric sclera  Neck: No masses, trachea midline  Respiratory: Normal respiratory effort. no wheeze, no rhonchi, no rales  Cardiovascular: S1/S2 normal, no murmur, no rub/gallop auscultated. RRR.   Musculoskeletal: Gait normal.   Neurological: Normal balance/coordination   Psychiatric: Normal judgment/insight. Normal mood and affect. Oriented x3.    Results for orders placed or performed in visit on 02/14/17 (from the past 72 hour(s))  POCT Urinalysis Dipstick     Status: Abnormal   Collection Time: 02/14/17  4:11 PM  Result Value Ref Range   Color, UA yellow    Clarity, UA clear    Glucose, UA 100 mg    Bilirubin, UA negative    Ketones, UA negative    Spec Grav, UA 1.015 1.010 - 1.025   Blood, UA trace-intact    pH, UA 6.0 5.0 - 8.0   Protein, UA >=300 mg     Urobilinogen, UA 0.2 0.2 or 1.0 E.U./dL   Nitrite, UA negative    Leukocytes, UA Small (1+) (A) Negative   Appearance     Odor      No results found.   ASSESSMENT/PLAN: History of urethral stricture, sounds like this may have been punctured when she was catheterized during the hospitalization and she has ever since had some incontinence issues and urgency problems. We'll go ahead and await culture to rule out infection, discussed possibility of treating for urge incontinence the patient would rather not add a medication at this time. Blood pressure on the high side, she would rather wait to adjust any medications, we'll follow closely with nurse visit blood pressure in 2 weeks  Urinary frequency - Plan: POCT Urinalysis Dipstick, Urine Culture, Urinalysis, microscopic only  Dysuria - Plan: POCT Urinalysis Dipstick, Urine Culture, Urinalysis, microscopic only  History of urethral stricture - Plan: POCT Urinalysis Dipstick, Urine Culture, Urinalysis, microscopic only  CKD stage 3 due to type 1 diabetes mellitus ([place])  Hypertension associated with diabetes [facility])    Patient Instructions  Plan:  Await urine culture   Call Dr Dorise Bullion office to confirm name of urologist she recommended   If can't get anywhere with Dr Dorise Bullion recommendation, will refer you to whatever urologist can see you soonest  Consider medications for urine urgency     Visit summary with medication list and pertinent instructions was printed for patient to review. All questions at time of visit were answered - patient instructed to contact office with any additional concerns. ER/RTC precautions were reviewed with the patient.   Follow-up plan: Return if symptoms worsen or fail to improve.  Note: Total time spent 25 minutes, greater than 50% of the visit was spent face-to-face counseling and coordinating care for the following: The primary encounter diagnosis was Urinary frequency. Diagnoses of Dysuria,  History of urethral stricture, CKD stage 3 due to type 1 diabetes mellitus ([place]), and Hypertension associated with diabetes ([place]) were also pertinent to this visit.Marland Kitchen  Please note: voice recognition software was used to produce this document, and typos may escape review. Please contact Dr. Sheppard Coil for any needed clarifications.

## 2017-02-15 DIAGNOSIS — A419 Sepsis, unspecified organism: Secondary | ICD-10-CM | POA: Insufficient documentation

## 2017-02-15 DIAGNOSIS — N390 Urinary tract infection, site not specified: Secondary | ICD-10-CM | POA: Insufficient documentation

## 2017-02-15 DIAGNOSIS — G9341 Metabolic encephalopathy: Secondary | ICD-10-CM | POA: Insufficient documentation

## 2017-02-15 LAB — URINALYSIS, MICROSCOPIC ONLY
Hyaline Cast: NONE SEEN /LPF
RBC / HPF: NONE SEEN /HPF (ref 0–2)
Squamous Epithelial / LPF: NONE SEEN /HPF (ref <=5)
WBC, UA: >=60 /HPF — AB (ref 0–5)

## 2017-02-16 ENCOUNTER — Other Ambulatory Visit: Payer: Self-pay | Admitting: Osteopathic Medicine

## 2017-02-16 LAB — URINE CULTURE
MICRO NUMBER:: 90092385
SPECIMEN QUALITY:: ADEQUATE

## 2017-02-16 MED ORDER — CIPROFLOXACIN HCL 500 MG PO TABS: 500.0000 mg | ORAL_TABLET | Freq: Two times a day (BID) | ORAL | 0 refills | Status: DC

## 2017-02-16 NOTE — Progress Notes (Signed)
UTI

## 2017-02-20 DIAGNOSIS — E878 Other disorders of electrolyte and fluid balance, not elsewhere classified: Secondary | ICD-10-CM | POA: Insufficient documentation

## 2017-02-28 ENCOUNTER — Telehealth: Payer: Self-pay

## 2017-02-28 NOTE — Telephone Encounter (Signed)
Left message advising ok for strength training.

## 2017-02-28 NOTE — Telephone Encounter (Signed)
As per Dr. Sheppard Coil, verbal order given for strength training.

## 2017-02-28 NOTE — Telephone Encounter (Signed)
Brittney Pace was admitted into the hospital for altered mental status. The hospital referred home assessment. Brittney Pace is calling for verbal order for strength training. Please advise.

## 2017-03-01 ENCOUNTER — Ambulatory Visit: Payer: BLUE CROSS/BLUE SHIELD | Admitting: Osteopathic Medicine

## 2017-03-01 DIAGNOSIS — Z0189 Encounter for other specified special examinations: Secondary | ICD-10-CM

## 2017-03-03 NOTE — Telephone Encounter (Signed)
Brittney Pace who is suppose to do the strength training with Brittney Pace called and stated they had to postpone due to the patient feeling overwhelmed. He will reach out to patient again on Mon. Feb 11th to reschedule the evaluation. Thanks

## 2017-03-06 NOTE — Telephone Encounter (Signed)
FYI. See note below.  

## 2017-03-09 ENCOUNTER — Telehealth: Payer: Self-pay | Admitting: Osteopathic Medicine

## 2017-03-09 NOTE — Telephone Encounter (Signed)
Gani from the home health agency called and wanted to update you on Brittney Pace's evaluation. He said at this time she does not need any OT services and that she is physically able to take care of herself. He did state that he noticed she had some psychological problems. Thanks

## 2017-04-07 ENCOUNTER — Other Ambulatory Visit: Payer: Self-pay | Admitting: Neurology

## 2017-04-10 ENCOUNTER — Other Ambulatory Visit: Payer: Self-pay | Admitting: Neurology

## 2017-04-10 MED ORDER — ARMODAFINIL 250 MG PO TABS: ORAL_TABLET | ORAL | 1 refills | Status: DC

## 2017-04-10 NOTE — Telephone Encounter (Signed)
Patient was last seen 05/2016 and does not have any follow up appointment scheduled. Ok to continue to refill?

## 2017-04-10 NOTE — Telephone Encounter (Signed)
I will send in a one month refill.     She needs to make a f/u with me.    For controlled substances like armodafinil we need to see at least twice a year

## 2017-06-02 ENCOUNTER — Telehealth: Payer: Self-pay

## 2017-06-02 NOTE — Telephone Encounter (Signed)
Sounds good

## 2017-06-02 NOTE — Telephone Encounter (Signed)
Pt left vm msg stating she needs a RF for armodafinil rx. As per pt, rx was prescribed by Dr. Felecia Shelling (Neurologist), but was unsatisfied with care provided and is no longer a pt. Per pt, she had a discussion with Dr. Sheppard Coil to have med RF completed by her 6 mths ago. However there's no notes documented supporting this discussion. I've called pt, she has been advised to make an appt next wk to discuss in details re: med RF with Dr. Sheppard Coil. Pt has agreed with recommendation & was transferred to scheduling desk to make an appt.

## 2017-06-08 ENCOUNTER — Ambulatory Visit: Payer: BLUE CROSS/BLUE SHIELD | Admitting: Osteopathic Medicine

## 2017-06-08 ENCOUNTER — Encounter: Payer: Self-pay | Admitting: Osteopathic Medicine

## 2017-06-08 ENCOUNTER — Ambulatory Visit (INDEPENDENT_AMBULATORY_CARE_PROVIDER_SITE_OTHER): Payer: BLUE CROSS/BLUE SHIELD | Admitting: Osteopathic Medicine

## 2017-06-08 VITALS — BP 148/68 | HR 66 | Temp 97.6°F | Wt 96.8 lb

## 2017-06-08 DIAGNOSIS — R5382 Chronic fatigue, unspecified: Secondary | ICD-10-CM

## 2017-06-08 DIAGNOSIS — M542 Cervicalgia: Secondary | ICD-10-CM | POA: Diagnosis not present

## 2017-06-08 MED ORDER — ARMODAFINIL 250 MG PO TABS: ORAL_TABLET | ORAL | 1 refills | Status: DC

## 2017-06-08 MED ORDER — CYCLOBENZAPRINE HCL 10 MG PO TABS: 5.0000 mg | ORAL_TABLET | Freq: Three times a day (TID) | ORAL | 1 refills | Status: DC | PRN

## 2017-06-08 NOTE — Patient Instructions (Signed)
Neurology:[place], Simeon Craft, MD  Phone: [phone]

## 2017-06-08 NOTE — Progress Notes (Signed)
HPI: Brittney Pace is a 62 y.o. female who  has a past medical history of Anemia, Arthritis, Babesiasis, Chronic kidney disease, Coronary artery disease, Depression, Diabetes mellitus without complication ([place]), Diabetic retinopathy ([place]), Family history of adverse reaction to anesthesia, Fibromyalgia, Gastroparesis, GERD (gastroesophageal reflux disease), Headache, Hypothyroidism, IBS (irritable bowel syndrome), Idiopathic edema, Lyme disease, Mitral valve prolapse, Myocardial infarction ([place]), Osteoporosis, Peripheral neuropathy, Peripheral vascular disease ([place]), Sinus disorder, and Stroke ([place]).  she presents to [facility] today, 06/08/17,  for chief complaint of:  Med Refill  Patient previously receiving armodafinil prescription from neurology.History of severe fatigue amd daytime somnolence. Multiple medical comorbidities contributing to chronic fatigue. She asks if I am okay to refill this medication until she can get in to discuss further with her new neurologist,she was not particularly happy with previous prescriber's bedside manner.  Otherwise doing well, following with neurology/cardio after CVA earlier this year.i briefly reviewed those notes, see care everywhere. She appears to be stable. Recently saw nurse practitioner or 53 assistant at the neurology office, she was pleased with this person but would like to meet the physician prior to her follow-up in September.  Patient is accompanied by husband who assists with history-taking.   Past medical history, surgical history, and family history reviewed.  Current medication list and allergy/intolerance information reviewed.   (See remainder of HPI, ROS, Phys Exam below)    ASSESSMENT/PLAN: The primary encounter diagnosis was Chronic fatigue. A diagnosis of Neck pain without injury was also pertinent to this visit.    Meds ordered this encounter  Medications  . Armodafinil 250 MG  tablet    Sig: Take 1/2 to 1 pill in the mornings    Dispense:  45 tablet    Refill:  1  . cyclobenzaprine (FLEXERIL) 10 MG tablet    Sig: Take 0.5-1 tablets (5-10 mg total) by mouth 3 (three) times daily as needed for muscle spasms. Caution: can cause drowsiness    Dispense:  60 tablet    Refill:  1    Patient Instructions  Neurology:[place], Simeon Craft, MD  Phone: [phone]         Advised can call the neurologist and see if they may be willing to meet with her sooner, in the meantime if anything else comes up let me know.I'm okay to continue the medication until follow-up with neurologyand see if they have any other recommendations.    Follow-up plan: Return for recheck as needed .        ############################################ ############################################ ############################################ ############################################    Outpatient Encounter Medications as of 06/08/2017  Medication Sig Note  . acyclovir (ZOVIRAX) 400 MG tablet Take 1 tablet (400 mg total) by mouth 2 (two) times daily.   . Alpha-Lipoic Acid 600 MG CAPS Take 600 mg by mouth 2 (two) times daily.    . Armodafinil 250 MG tablet Take 1/2 to 1 pill in the mornings   . aspirin EC 325 MG tablet Take 81 mg by mouth daily.  05/05/2015: On hold  . carvedilol (COREG) 3.125 MG tablet Take 1 tablet (3.125 mg total) by mouth 2 (two) times daily with a meal.   . cetirizine (ZYRTEC) 10 MG tablet Take 10 mg by mouth at bedtime.   . Cholecalciferol (VITAMIN D3) 5000 UNITS TABS Take 1,000 Units by mouth daily.    . cyclobenzaprine (FLEXERIL) 10 MG tablet Take 0.5-1 tablets (5-10 mg total) by mouth 3 (three) times daily as needed for muscle spasms. Caution:  can cause drowsiness   . Esomeprazole Magnesium (NEXIUM PO) Take 20 mg by mouth every morning.    . fluticasone (FLONASE) 50 MCG/ACT nasal spray Place 2 sprays into both nostrils daily.   . Insulin Human (INSULIN  PUMP) SOLN Inject into the skin continuous. Novolog insulin   . levothyroxine (SYNTHROID, LEVOTHROID) 25 MCG tablet Take 1 tablet (25 mcg total) by mouth daily before breakfast.   . NATURE-THROID 32.5 MG tablet Take 1 tablet (32.5 mg total) daily by mouth.   . nitroGLYCERIN (NITROLINGUAL) 0.4 MG/SPRAY spray Place 1 spray under the tongue every 5 (five) minutes x 3 doses as needed for chest pain.   . NON FORMULARY Chloroella   . OVER THE COUNTER MEDICATION Take 750 mg by mouth at bedtime. GABA   . OVER THE COUNTER MEDICATION as directed. Magnesium gluconate   . OVER THE COUNTER MEDICATION as directed. Omega EPA/DHA   . OVER THE COUNTER MEDICATION as directed. Grape fruit seed extract   . potassium chloride (K-DUR,KLOR-CON) 10 MEQ tablet Take 1 tablet (10 mEq total) by mouth 2 (two) times daily.   Marland Kitchen torsemide ([name]) 20 MG tablet TAKE ONE TABLET BY MOUTH ONCE DAILY IN THE MORNING.  MAY  TAKE  AN  EXTRA  ONE-HALF  TABLET  AS  NEEDED   . [DISCONTINUED] Armodafinil 250 MG tablet Take 1/2 to 1 pill in the mornings   . [DISCONTINUED] cyclobenzaprine (FLEXERIL) 10 MG tablet Take 0.5-1 tablets (5-10 mg total) by mouth 3 (three) times daily as needed for muscle spasms. Caution: can cause drowsiness   . arginine 500 MG tablet Take 500 mg by mouth daily.   . Linoleic Acid Conjugated 1000 MG CAPS Take 1,000 mg by mouth daily.    . Methylsulfonylmethane (MSM) 1000 MG TABS Take by mouth. 1-4 times a day   . VITAMIN K PO Take by mouth as directed.   . [DISCONTINUED] ciprofloxacin (CIPRO) 500 MG tablet Take 1 tablet (500 mg total) by mouth 2 (two) times daily. For 10 days (Patient not taking: Reported on 06/08/2017)   . [DISCONTINUED] Cyanocobalamin (B-12) 1000 MCG CAPS Take 1,000 mcg by mouth daily. (Patient not taking: Reported on 06/08/2017)   . [DISCONTINUED] diclofenac sodium (VOLTAREN) 1 % GEL Apply 2 g topically 4 (four) times daily. To affected joint. (Patient not taking: Reported on 06/08/2017)   .  [DISCONTINUED] isosorbide mononitrate (IMDUR) 120 MG 24 hr tablet Take 1 tablet (120 mg total) by mouth at bedtime. (Patient not taking: Reported on 06/08/2017)   . [DISCONTINUED] ketorolac (TORADOL) 10 MG tablet Take 1 tablet (10 mg total) by mouth every 8 (eight) hours as needed for severe pain. (Patient not taking: Reported on 06/08/2017)   . [DISCONTINUED] mupirocin cream (BACTROBAN) 2 % Apply 1 application topically 3 (three) times daily. (Patient not taking: Reported on 06/08/2017)   . [DISCONTINUED] promethazine (PHENERGAN) 25 MG tablet Take 1 tablet (25 mg total) by mouth every 6 (six) hours as needed for nausea or vomiting. (Patient not taking: Reported on 06/08/2017)   . [DISCONTINUED] vitamin C (ASCORBIC ACID) 500 MG tablet Take 1 tablet (500 mg total) by mouth daily. (Patient not taking: Reported on 06/08/2017)    No facility-administered encounter medications on file as of 06/08/2017.    Allergies  Allergen Reactions  . Dilaudid [Hydromorphone] Other (See Comments)    Because of continuous glucose monitor  . Morphine Anaphylaxis and Shortness Of Breath  . Morphine And Related Shortness Of Breath  . Penicillins Hives  Has patient had a PCN reaction causing immediate rash, facial/tongue/throat swelling, SOB or lightheadedness with hypotension: Yes Has patient had a PCN reaction causing severe rash involving mucus membranes or skin necrosis: No Has patient had a PCN reaction that required hospitalization No Has patient had a PCN reaction occurring within the last 10 years: No If all of the above answers are "NO", then may proceed with Cephalosporin use.  . Tramadol Anaphylaxis  . Acetaminophen Other (See Comments)    Alters insulin pump readings Alters insulin pump readings Alters insulin pump readings Alters insulin pump readings  . Amitriptyline Other (See Comments)    Severe headache/ out of body feeling Severe headache  . Codeine Other (See Comments)    Severe headaches/ out  of body feeling Severe headache  . Darvon [Propoxyphene] Other (See Comments)    Severe headache Severe headache Severe headaches / out of body feeling  . Other Swelling and Other (See Comments)    Cats itching Mold sinuses Cats itching Mold sinuses Cat hair and scratches cause swelling  . Statins Other (See Comments)    Muscle pains  . Sulfamethoxazole Other (See Comments)  . Advil [Ibuprofen] Other (See Comments)    Messes up CGM reading on glucose monitor  . Mold Extract [Trichophyton] Other (See Comments)    sinusitis  . Sulfa Antibiotics Other (See Comments)    Mouth ulcers  . Sulfites Other (See Comments)    Mouth ulcers  . Ultracet [Tramadol-Acetaminophen] Other (See Comments)    Small vessel heart attack      Review of Systems:  Constitutional: No recent illness  HEENT: No headache, no vision change  Cardiac: No  chest pain, No  pressure, No palpitations  Respiratory:  No  shortness of breath. No  Cough  Gastrointestinal: No  abdominal pain, no change on bowel habits  Musculoskeletal: No new myalgia/arthralgia  Skin: No  Rash  Neurologic: +generalized nonfocal weakness, No  Dizziness   Exam:  BP (!) 148/68 (BP Location: Left Arm, Patient Position: Sitting, Cuff Size: Small)   Pulse 66   Temp 97.6 F (36.4 C) (Oral)   Wt 96 lb 12.8 oz (43.9 kg)   BMI 19.06 kg/m   Constitutional: VS see above. General Appearance: alert, well-developed, well-nourished, NAD  Eyes: Normal lids and conjunctive, non-icteric sclera  Ears, Nose, Mouth, Throat: MMM, Normal external inspection ears/nares/mouth/lips/gums.  Neck: No masses, trachea midline.   Respiratory: Normal respiratory effort.    Musculoskeletal: Gait normal. Symmetric and independent movement of all extremities  Neurological: Normal balance/coordination. No tremor.  Skin: warm, dry, intact.   Psychiatric: Normal judgment/insight. Normal mood and affect. Oriented x3.   Visit summary with  medication list and pertinent instructions was printed for patient to review, advised to alert [place] if any changes needed. All questions at time of visit were answered - patient instructed to contact office with any additional concerns. ER/RTC precautions were reviewed with the patient and understanding verbalized.   Follow-up plan: Return for recheck as needed .  Note: Total time spent 25 minutes, greater than 50% of the visit was spent face-to-face counseling and coordinating care for the following: The primary encounter diagnosis was Chronic fatigue. A diagnosis of Neck pain without injury was also pertinent to this visit.Marland Kitchen  Please note: voice recognition software was used to produce this document, and typos may escape review. Please contact Dr. Sheppard Coil for any needed clarifications.

## 2017-06-09 ENCOUNTER — Encounter: Payer: Self-pay | Admitting: Osteopathic Medicine

## 2017-06-23 ENCOUNTER — Telehealth: Payer: Self-pay | Admitting: Cardiology

## 2017-06-23 NOTE — Telephone Encounter (Signed)
Returned call to pt she states that she has been real sick in the hosp lately she went to [facility].she states that she is retaining fluid in her ankles and wants direction for water pill. She states that she is taking Torsemide 20mg  daily and she used to just take an extra 1/2 tab when she was swelling and it would go away. She has an appt to re-establish w/Dr Lawrence 07-17-17 She states that she will weigh herself daily and then take the extra 1/2 Torsemide now and readdress daily over the weekend/ informed pt to be careful and only take the extra for no more than 3 days.. She will keep a log of her weight over the weekend and call back if this does not resolve. Informed pt that if SOB worsens of any additional sx arise she should go directly to the ER for assessment. CB with any questions or concerns.

## 2017-06-23 NOTE — Telephone Encounter (Signed)
New message    Pt c/o swelling: STAT is pt has developed SOB within 24 hours  1) How much weight have you gained and in what time span? Unknown  2) If swelling, where is the swelling located? ANKLES   3) Are you currently taking a fluid pill? YES  4) Are you currently SOB? NO, sob on exertion  5) Do you have a log of your daily weights (if so, list)? 93 lbs, 96 lbs,   6) Have you gained 3 pounds in a day or 5 pounds in a week? Unknown  7) Have you traveled recently? NO

## 2017-07-16 NOTE — Progress Notes (Signed)
Cardiology Office Note   Date:  07/17/2017   ID:  Brittney Pace, DOB 1955-05-08, MRN [id number]  PCP:  Emeterio Reeve, DO  Cardiologist:  [facility]  Chief Complaint  Patient presents with  . Follow-up  . Chest Pain  . Shortness of Breath  . Foot Swelling     History of Present Illness: Brittney Pace is a 62 y.o. female who presents for ongoing assessment and management of coronary artery disease, with history of CABG (LIMA to LAD, SVG to RCA, stents to the LIMA graft and LAD in 2000.  Has been followed by [facility] with most recent catheterization in August 2015 revealing two-vessel coronary artery disease with patent LIMA to LAD, and SVG to RCA.  The patient was last seen in the office by Dr. Stanford Breed on 08/06/2015.  She was noted to be intolerant to statins.  Other history includes thyroid disease, hypertension.   She has been followed by [facility] but give her office a call on 06/23/2017 with complaints of lower extremity edema.  Apparently she has been placed on torsemide 20 mg daily and has been taking extra half tablets when edema persisted.  She wished to be reestablished with our practice.  She is being followed by numerous doctors at numerous practices,  from [place]  to [facility], a neurologist, an endocrinologist, and a Lyme disease infectious disease specialist.  She comes with multiple somatic complaints.  The patient has not been seen consistently by her cardiologist despite multiple admissions.  She would like to be evaluated again from a cardiology perspective with baseline information and recommendations.  She is in pain all the time, has shortness of breath, dyspnea on exertion, significant fatigue, frequent headaches, chronic musculoskeletal pain.  She believes the etiology of all of this is from [place].   She also states, that she has been seen by neurologist who has ordered a cardiac monitor, this was completed a month ago and she is yet to receive  report on its findings.  This was placed to rule out atrial fibrillation reportedly from the patient.  Past Medical History:  Diagnosis Date  . Anemia   . Arthritis   . 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 Chronic kidney disease    stage 3  . Coronary artery disease   . Depression   . Diabetes mellitus without complication (HCC)    Type 1  . Diabetic retinopathy ([place])   . Family history of adverse reaction to anesthesia    mother: " while she was under she stopped breathing."  . Fibromyalgia   . Gastroparesis   . GERD (gastroesophageal reflux disease)   . Headache    migraines  . Hypothyroidism   . IBS (irritable bowel syndrome)   . Idiopathic edema   . Lyme disease   . Mitral valve prolapse   . Myocardial infarction ([place])    1 major in 1999 and 2 minor " small vessel disease."  . Osteoporosis   . Peripheral neuropathy   . Peripheral vascular disease ([place])   . 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 ABDOMINAL HYSTERECTOMY    . APPENDECTOMY    . BREAST SURGERY     B/L biopsy and lumpectomy   . CARDIAC CATHETERIZATION    .  CARPAL TUNNEL RELEASE    . CATARACT EXTRACTION W/ INTRAOCULAR LENS IMPLANT     right eye  . COLONOSCOPY W/ BIOPSIES AND POLYPECTOMY    . CORONARY ARTERY BYPASS GRAFT    . coronary artery stents     at LAD and LIMA  . ECTOPIC PREGNANCY SURGERY    . NASAL SEPTUM SURGERY    . OPEN REDUCTION INTERNAL FIXATION (ORIF) DISTAL RADIAL FRACTURE Right 05/06/2015   Procedure: OPEN REDUCTION INTERNAL FIXATION (ORIF) RIGHT DISTAL RADIAL FRACTURE AND REPAIRS AS NEEDED;  Surgeon: Iran Planas, MD;  Location: [place];  Service: Orthopedics;  Laterality: Right;  . ORIF WRIST FRACTURE Left 11/05/2014  . ORIF WRIST FRACTURE Left 11/05/2014   Procedure: OPEN REDUCTION INTERNAL FIXATION (ORIF) LEFT  WRIST FRACTURE AND REPAIR AS INDICATED;  Surgeon: Iran Planas, MD;  Location: [place];  Service: Orthopedics;  Laterality: Left;  . TRIGGER FINGER RELEASE       Current Outpatient Medications  Medication Sig Dispense Refill  . acyclovir (ZOVIRAX) 400 MG tablet Take 1 tablet (400 mg total) by mouth 2 (two) times daily. 180 tablet 3  . Alpha-Lipoic Acid 600 MG CAPS Take 600 mg by mouth 2 (two) times daily.     Marland Kitchen arginine 500 MG tablet Take 500 mg by mouth daily.    . Armodafinil 250 MG tablet Take 1/2 to 1 pill in the mornings 45 tablet 1  . aspirin EC 81 MG EC tablet Take 1 tablet (81 mg total) by mouth daily.    . carvedilol (COREG) 3.125 MG tablet Take 1 tablet (3.125 mg total) by mouth 2 (two) times daily with a meal. 180 tablet 3  . cetirizine (ZYRTEC) 10 MG tablet Take 10 mg by mouth at bedtime.    . Cholecalciferol (VITAMIN D3) 5000 UNITS TABS Take 1,000 Units by mouth daily.     . cyclobenzaprine (FLEXERIL) 10 MG tablet Take 0.5-1 tablets (5-10 mg total) by mouth 3 (three) times daily as needed for muscle spasms. Caution: can cause drowsiness 60 tablet 1  . Esomeprazole Magnesium (NEXIUM PO) Take 20 mg by mouth every morning.     . fluticasone (FLONASE) 50 MCG/ACT nasal spray Place 2 sprays into both nostrils daily. 16 g 11  . Insulin Human (INSULIN PUMP) SOLN Inject into the skin continuous. Novolog insulin    . levothyroxine (SYNTHROID, LEVOTHROID) 25 MCG tablet Take 1 tablet (25 mcg total) by mouth daily before breakfast. 90 tablet 3  . Linoleic Acid Conjugated 1000 MG CAPS Take 1,000 mg by mouth daily.     . Methylsulfonylmethane (MSM) 1000 MG TABS Take by mouth. 1-4 times a day    . nitroGLYCERIN (NITROLINGUAL) 0.4 MG/SPRAY spray Place 1 spray under the tongue every 5 (five) minutes x 3 doses as needed for chest pain. 12 g 1  . NON FORMULARY Chloroella    . OVER THE COUNTER MEDICATION Take 750 mg by mouth at bedtime. GABA    . OVER THE COUNTER MEDICATION as directed. Magnesium  L-Treonate    . OVER THE COUNTER MEDICATION as directed. Omega EPA/DHA    . OVER THE COUNTER MEDICATION as directed. Grape fruit seed extract    . potassium chloride (K-DUR,KLOR-CON) 10 MEQ tablet Take 1 tablet (10 mEq total) by mouth 2 (two) times daily. 180 tablet 3  . torsemide ([name]) 20 MG tablet TAKE ONE TABLET BY MOUTH ONCE DAILY IN THE MORNING.  MAY  TAKE  AN  EXTRA  ONE-HALF  TABLET  AS  NEEDED 135 tablet 3   No current facility-administered medications for this visit.     Allergies:   Dilaudid [hydromorphone]; Morphine; Morphine and related; Penicillins; Tramadol; Tramadol-acetaminophen; Acetaminophen; Amitriptyline; Codeine; Gluten meal; Ibuprofen; Other; Propoxyphene; Statins; Sulfamethoxazole; Wheat bran; Shellfish allergy; Sulfa antibiotics; Sulfites; and Trichophyton    Social History:  The patient  reports that she has quit smoking. Her smoking use included cigarettes. She has never used smokeless tobacco. She reports that she drinks alcohol. She reports that she does not use drugs.   Family History:  The patient's family history includes Breast cancer in her mother and sister; Heart disease in her father; Hypertension in her father.    ROS: All other systems are reviewed and negative. Unless otherwise mentioned in H&P    PHYSICAL EXAM: VS:  BP (!) 152/75   Pulse 61   Ht 4' 11.5" (1.511 m)   Wt 96 lb 12.8 oz (43.9 kg)   BMI 19.22 kg/m  , BMI Body mass index is 19.22 kg/m. GEN: Well nourished, well developed, in no acute distress thin, pale HEENT: normal  Neck: no JVD, carotid bruits, or masses Cardiac: RRR; no murmurs, rubs, or gallops,no edema  Respiratory:  clear to auscultation bilaterally, normal work of breathing GI: soft, nontender, nondistended, + BS MS: no deformity or atrophy  Skin: warm and dry, no rash Neuro:  Strength and sensation are intact Psych: euthymic mood, full affect   EKG: Normal sinus rhythm with first-degree AV block.  Heart rate of 61  bpm.  Recent Labs: No results found for requested labs within last 8760 hours.    Lipid Panel    Component Value Date/Time   CHOL 172 06/29/2015 1217   TRIG 84 06/29/2015 1217   HDL 73 06/29/2015 1217   CHOLHDL 2.4 06/29/2015 1217   VLDL 17 06/29/2015 1217   LDLCALC 82 06/29/2015 1217      Wt Readings from Last 3 Encounters:  07/17/17 96 lb 12.8 oz (43.9 kg)  06/08/17 96 lb 12.8 oz (43.9 kg)  02/14/17 95 lb 1.9 oz (43.1 kg)      Other studies Reviewed: [place], [place]. [id number] Transthoracic Echocardiogram Report  Name: YVONDA, FOUTY PITTS Study Date: 02/17/2017 Height: 44 in MRN: [id number] Patient Location: R421 Weight: 99 lb DOB: 1955-02-27 Gender: Female BSA: 1.2 m2 Age: 65 yrsEthnicity: Caucasian BP: 146/62 mmHg Reason For Study: Altered mental status, unspecified altered mental status  type HR: 64  Ordering Physician: [id number] [place], [place] Performed By: Levy Sjogren Fellow: Blenda Bridegroom Referring Physician: SELF, A REFERRAL - HISTORY Stroke, CAD, HTN, HLD, Thyroid disease,. -  PROCEDURE Study Quality: Technically adequate. Injection of agitated saline contrast  performed to evaluate for possible shunt. - SUMMARY The left ventricular size is normal. There is normal left ventricular wall thickness.  Left ventricular systolic function is normal. LV ejection fraction = 60-65%.  Left ventricular filling pattern is normal. No segmental wall motion abnormalities seen in the left ventricle The right ventricle is normal in size and function. There is no significant valvular stenosis or regurgitation There was insufficient TR detected to calculate RV systolic pressure. Injection of agitated saline showed no right-to-left shunt. There is no pericardial effusion. Probably no significant change in comparison with the prior study noted - FINDINGS:  LEFT VENTRICLE The left ventricular size is normal. There is normal  left ventricular wall  thickness. Left ventricular systolic function is normal. LV ejection fraction  = 60-65%. Left ventricular filling pattern is normal. No segmental  wall  motion abnormalities seen in the left ventricle. -  RIGHT VENTRICLE The right ventricle is normal in size and function.  LEFT ATRIUM The left atrial size is normal.  RIGHT ATRIUM  Right atrial size is normal. Injection of agitated saline showed no  right-to-left shunt. - AORTIC VALVE There is aortic valve sclerosis. The aortic valve is trileaflet. The aortic  valve opens well. There is no aortic stenosis. There is trace aortic  regurgitation. - MITRAL VALVE There is trivial mitral valve thickening. There is trace mitral regurgitation. - TRICUSPID VALVE There is trivial tricuspid valve thickening. There is trace tricuspid  regurgitation. There was insufficient TR detected to calculate RV systolic  pressure. - PULMONIC VALVE The pulmonic valve is not well visualized. Trace pulmonic valvular  regurgitation. - ARTERIES The aortic root is normal size. - VENOUS Pulmonary veins were not well visualized during exam. IVC size was normal. - EFFUSION There is no pericardial effusion. - ASSESSMENT AND PLAN:  1.  Coronary artery disease: History of CABG with LIMA to LAD, SVG to RCA, stents to the LIMA graft and LAD in 2000.  She has been followed by [facility] with most recent catheterization in 2015.  She wishes to be reestablished with our practice for baseline cardiology recommendations and testing as she has not been seen by a cardiologist consistently since seeing Dr. Stanford Breed back in 2017.  Due to multiple somatic complaints, I am uncertain whether her symptoms are directly related to cardiac etiology.  My plan is to order an echocardiogram for comparison to prior echo in January 2019 for changes in LV function.  She will also have a Lexiscan Myoview in the setting of known CAD with  bypass grafting to evaluate for progression of CAD.  I will repeat a BMET, a TSH.  2.  Hypothyroidism: TSH is being ordered due to profound fatigue.  She states she has not had this drawn recently.  Last values were drawn in 2018.  She has not been seen regularly by endocrinologist concerning thyroid disease.  3.  Type 2 diabetes: She states she has not been taking her blood sugar regularly.  When she does take it it is elevated greater than 200 or higher.  I will check hemoglobin A1c.  4.  History of Babesiasis: Multiple complaints associated with this.  She is being followed by several physicians for sequela symptoms.    Current medicines are reviewed at length with the patient today.    Labs/ tests ordered today include: Echocardiogram, Lexiscan Myoview, TSH, hemoglobin A1c, BMET.  Phill Myron. [place] Pugh, ANP, AACC   07/17/2017 12:18 PM    [facility] [address], [place],  [address] Phone: [phone]; Fax: [phone]

## 2017-07-17 ENCOUNTER — Encounter: Payer: Self-pay | Admitting: Cardiology

## 2017-07-17 ENCOUNTER — Encounter: Payer: Self-pay | Admitting: Adult Health

## 2017-07-17 ENCOUNTER — Ambulatory Visit: Payer: BLUE CROSS/BLUE SHIELD | Admitting: Adult Health

## 2017-07-17 VITALS — BP 152/75 | HR 61 | Ht 59.5 in | Wt 96.8 lb

## 2017-07-17 DIAGNOSIS — I519 Heart disease, unspecified: Secondary | ICD-10-CM | POA: Diagnosis not present

## 2017-07-17 DIAGNOSIS — R5383 Other fatigue: Secondary | ICD-10-CM

## 2017-07-17 DIAGNOSIS — R079 Chest pain, unspecified: Secondary | ICD-10-CM | POA: Diagnosis not present

## 2017-07-17 DIAGNOSIS — I2510 Atherosclerotic heart disease of native coronary artery without angina pectoris: Secondary | ICD-10-CM | POA: Diagnosis not present

## 2017-07-17 DIAGNOSIS — R0602 Shortness of breath: Secondary | ICD-10-CM

## 2017-07-17 DIAGNOSIS — N183 Chronic kidney disease, stage 3 unspecified: Secondary | ICD-10-CM

## 2017-07-17 DIAGNOSIS — E1022 Type 1 diabetes mellitus with diabetic chronic kidney disease: Secondary | ICD-10-CM

## 2017-07-17 DIAGNOSIS — Z79899 Other long term (current) drug therapy: Secondary | ICD-10-CM

## 2017-07-17 DIAGNOSIS — E1059 Type 1 diabetes mellitus with other circulatory complications: Secondary | ICD-10-CM

## 2017-07-17 MED ORDER — ASPIRIN 81 MG PO TBEC: 81.0000 mg | DELAYED_RELEASE_TABLET | Freq: Every day | ORAL | Status: DC

## 2017-07-17 NOTE — Patient Instructions (Signed)
Medication Instructions:  DECREASE ASPIRIN 81MG  DAILY  If you need a refill on your cardiac medications before your next appointment, please call your pharmacy.  Labwork: A1C,TSH, AND  BMET TODAY HERE IN OUR OFFICE AT [facility]  Take the provided lab slips with you to the lab for your blood draw.   Testing/Procedures: Echocardiogram - Your physician has requested that you have an echocardiogram. Echocardiography is a painless test that uses sound waves to create images of your heart. It provides your doctor with information about the size and shape of your heart and how well your heart's chambers and valves are working. This procedure takes approximately one hour. There are no restrictions for this procedure. This will be performed at our [facility] location - [address], [address].  Your physician has requested that you have a lexiscan myoview. A cardiac stress test is a cardiological test that measures the heart's ability to respond to external stress in a controlled clinical environment. The stress response is induced byintravenous pharmacological stimulation. For further information please visit [facility]. Please follow instructions below. How to prepare for your Myocardial Perfusion Test:   Do not eat or drink 3 hours prior to your test, except you may have water.  Do not consume products containing caffeine (regular or decaffeinated) 12 hours prior to your test. (ex: coffee, chocolate, sodas, tea).  Do wear comfortable clothes (no dresses or overalls) and walking shoes, tennis shoes preferred (No heels or open toe shoes are allowed).  Do NOT wear cologne, perfume, aftershave, or lotions (deodorant is allowed).  If these instructions are not followed, your test will have to be rescheduled.  If you have questions or concerns about your appointment, you can call the Nuclear Lab at [phone].  Special Instructions: MAKE SURE TO HAVE THEM FAX THE MONITOR RESULTS TO  [phone]  Follow-Up: Your physician wants you to follow-up in: AFTER WITH DR CRENSHAW -OR- KATHRYN LAWRENCE ([place]), DNP,AACC IF PRIMARY CARDIOLOGIST IS UNAVAILABLE.    Thank you for choosing [facility] at [facility]!!

## 2017-07-18 LAB — BASIC METABOLIC PANEL
BUN/Creatinine Ratio: 21 (ref 12–28)
BUN: 23 mg/dL (ref 8–27)
CO2: 25 mmol/L (ref 20–29)
Calcium: 10.2 mg/dL (ref 8.7–10.3)
Chloride: 99 mmol/L (ref 96–106)
Creatinine, Ser: 1.11 mg/dL — ABNORMAL HIGH (ref 0.57–1.00)
GFR calc Af Amer: 61 mL/min/{1.73_m2} (ref >59)
GFR calc non Af Amer: 53 mL/min/{1.73_m2} — ABNORMAL LOW (ref >59)
Glucose: 137 mg/dL — ABNORMAL HIGH (ref 65–99)
Potassium: 4.7 mmol/L (ref 3.5–5.2)
Sodium: 138 mmol/L (ref 134–144)

## 2017-07-18 LAB — HEMOGLOBIN A1C
Est. average glucose Bld gHb Est-mCnc: 183 mg/dL
Hgb A1c MFr Bld: 8 % — ABNORMAL HIGH (ref 4.8–5.6)

## 2017-07-18 LAB — TSH: TSH: 3.03 u[IU]/mL (ref 0.450–4.500)

## 2017-07-28 ENCOUNTER — Encounter (HOSPITAL_COMMUNITY): Payer: BLUE CROSS/BLUE SHIELD

## 2017-07-28 ENCOUNTER — Other Ambulatory Visit (HOSPITAL_COMMUNITY): Payer: BLUE CROSS/BLUE SHIELD

## 2017-07-28 ENCOUNTER — Telehealth (HOSPITAL_COMMUNITY): Payer: Self-pay | Admitting: Adult Health

## 2017-07-28 NOTE — Telephone Encounter (Signed)
07/28/17 Called pt and lmsg for her to CB to r/s echo and myoview..RG 07/28/17 Patient called back and stated that she wanted to wait until she see's Dr. Stanford Breed.Marland KitchenRG

## 2017-08-08 ENCOUNTER — Ambulatory Visit: Payer: BLUE CROSS/BLUE SHIELD | Admitting: Adult Health

## 2017-08-22 ENCOUNTER — Emergency Department (INDEPENDENT_AMBULATORY_CARE_PROVIDER_SITE_OTHER): Payer: BLUE CROSS/BLUE SHIELD

## 2017-08-22 ENCOUNTER — Other Ambulatory Visit: Payer: Self-pay

## 2017-08-22 ENCOUNTER — Emergency Department
Admission: EM | Admit: 2017-08-22 | Discharge: 2017-08-22 | Disposition: A | Discharge status: Home / Self Care | Payer: BLUE CROSS/BLUE SHIELD | Source: Home / Self Care

## 2017-08-22 DIAGNOSIS — I739 Peripheral vascular disease, unspecified: Secondary | ICD-10-CM

## 2017-08-22 DIAGNOSIS — M79672 Pain in left foot: Secondary | ICD-10-CM

## 2017-08-22 NOTE — Discharge Instructions (Addendum)
See your Physician for recheck if pain persist ?
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
## 2017-08-23 NOTE — ED Provider Notes (Signed)
[facility]    CSN: [id number] Arrival date & time: 08/22/17  1216     History   Chief Complaint Chief Complaint  Patient presents with  . Toe Injury    HPI Brittney Pace is a 62 y.o. female.   Pt complains of stepping on a stone and causing her foot to hurt.  Pt reports he looked at the area and pulled a long white hair out of the area.    The history is provided by the patient. No language interpreter was used.    Past Medical History:  Diagnosis Date  . Anemia   . Arthritis   . 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 Chronic kidney disease    stage 3  . Coronary artery disease   . Depression   . Diabetes mellitus without complication (HCC)    Type 1  . Diabetic retinopathy ([place])   . Family history of adverse reaction to anesthesia    mother: " while she was under she stopped breathing."  . Fibromyalgia   . Gastroparesis   . GERD (gastroesophageal reflux disease)   . Headache    migraines  . Hypothyroidism   . IBS (irritable bowel syndrome)   . Idiopathic edema   . Lyme disease   . Mitral valve prolapse   . Myocardial infarction ([place])    1 major in 1999 and 2 minor " small vessel disease."  . Osteoporosis   . Peripheral neuropathy   . Peripheral vascular disease ([place])   . 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 Stroke [facility])    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" " the second stroke was a lacunar     Patient Active Problem List   Diagnosis Date Noted  . Hypertension associated with diabetes ([place]) 02/14/2017  . Brain atrophy 05/24/2016  . Chronic headache 05/24/2016  . Lacunar stroke ([place]) 01/20/2016  . Memory loss 01/20/2016  . Other fatigue 01/20/2016  . CAD (coronary artery disease) of artery bypass graft 01/20/2016  . Depression 01/20/2016  . Right lateral epicondylitis 12/16/2015  . 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 Lyme disease 12/16/2015  . Hyperlipidemia 09/25/2015  . Myopia of both eyes with astigmatism  and presbyopia 05/21/2015  . Vitreous syneresis of both eyes 05/21/2015  . Combined form of age-related cataract, left eye 05/21/2015  . Surgery, elective 05/06/2015  . CAD (coronary artery disease) 11/19/2014  . Diabetes mellitus type I ([place]) 11/19/2014  . DDD (degenerative disc disease), cervical 03/11/2014  . Vitreous hemorrhage of left eye ([place]) 03/03/2014  . DDD (degenerative disc disease), thoracic 01/07/2014  . Anemia due to stage 3 chronic kidney disease ([place]) 12/16/2013  . Osteoporosis 11/14/2013  . Chronic diastolic heart failure ([place]) 09/18/2013  . Cataract due to secondary diabetes ([place]) 08/26/2013  . 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 associated with type 2 diabetes mellitus ([place]) 08/26/2013  . Secondary diabetes mellitus with ophthalmic complication ([place]) 96/78/9381  . Hypothyroidism due to acquired atrophy of thyroid 08/02/2013  . Cerebral artery occlusion with cerebral infarction ([place]) 07/14/2013  . History of diabetic gastroparesis 04/09/2013  . Irritable bowel syndrome with diarrhea 04/09/2013  . Anemia of chronic disease 04/08/2013  . 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 CKD stage 3 due to type 1 diabetes mellitus ([place]) 04/08/2013  . Anemia 04/08/2013    Past Surgical History:  Procedure Laterality Date  . ABDOMINAL HYSTERECTOMY    .  APPENDECTOMY    . BREAST SURGERY     B/L biopsy and lumpectomy   . CARDIAC CATHETERIZATION    . CARPAL TUNNEL RELEASE    . CATARACT EXTRACTION W/ INTRAOCULAR LENS IMPLANT     right eye  . COLONOSCOPY W/ BIOPSIES AND POLYPECTOMY    . CORONARY ARTERY BYPASS GRAFT    . coronary artery stents     at LAD and LIMA  . ECTOPIC PREGNANCY SURGERY    . NASAL SEPTUM SURGERY    . OPEN REDUCTION INTERNAL FIXATION (ORIF) DISTAL RADIAL FRACTURE Right 05/06/2015   Procedure: OPEN REDUCTION INTERNAL FIXATION (ORIF) RIGHT DISTAL RADIAL FRACTURE AND REPAIRS AS NEEDED;  Surgeon:  Iran Planas, MD;  Location: [place];  Service: Orthopedics;  Laterality: Right;  . ORIF WRIST FRACTURE Left 11/05/2014  . ORIF WRIST FRACTURE Left 11/05/2014   Procedure: OPEN REDUCTION INTERNAL FIXATION (ORIF) LEFT WRIST FRACTURE AND REPAIR AS INDICATED;  Surgeon: Iran Planas, MD;  Location: [facility];  Service: Orthopedics;  Laterality: Left;  . TRIGGER FINGER RELEASE      OB History   None      Home Medications    Prior to Admission medications   Medication Sig Start Date End Date Taking? Authorizing Provider  acyclovir (ZOVIRAX) 400 MG tablet Take 1 tablet (400 mg total) by mouth 2 (two) times daily. 12/16/15   Emeterio Reeve, DO  Alpha-Lipoic Acid 600 MG CAPS Take 600 mg by mouth 2 (two) times daily.     [provider]  arginine 500 MG tablet Take 500 mg by mouth daily.    [provider]  Armodafinil 250 MG tablet Take 1/2 to 1 pill in the mornings 06/08/17   Emeterio Reeve, DO  aspirin EC 81 MG EC tablet Take 1 tablet (81 mg total) by mouth daily. 07/17/17   Lendon Colonel, NP  carvedilol (COREG) 3.125 MG tablet Take 1 tablet (3.125 mg total) by mouth 2 (two) times daily with a meal. 10/31/16   Emeterio Reeve, DO  cetirizine (ZYRTEC) 10 MG tablet Take 10 mg by mouth at bedtime.    [provider]  Cholecalciferol (VITAMIN D3) 5000 UNITS TABS Take 1,000 Units by mouth daily.     [provider]  cyclobenzaprine (FLEXERIL) 10 MG tablet Take 0.5-1 tablets (5-10 mg total) by mouth 3 (three) times daily as needed for muscle spasms. Caution: can cause drowsiness 06/08/17   Emeterio Reeve, DO  Esomeprazole Magnesium (NEXIUM PO) Take 20 mg by mouth every morning.     [provider]  fluticasone (FLONASE) 50 MCG/ACT nasal spray Place 2 sprays into both nostrils daily. 07/14/16   Emeterio Reeve, DO  Insulin Human (INSULIN PUMP) SOLN Inject into the skin continuous. Novolog insulin    [provider]  levothyroxine  (SYNTHROID, LEVOTHROID) 25 MCG tablet Take 1 tablet (25 mcg total) by mouth daily before breakfast. 10/31/16   Emeterio Reeve, DO  Linoleic Acid Conjugated 1000 MG CAPS Take 1,000 mg by mouth daily.     [provider]  Methylsulfonylmethane (MSM) 1000 MG TABS Take by mouth. 1-4 times a day    [provider]  nitroGLYCERIN (NITROLINGUAL) 0.4 MG/SPRAY spray Place 1 spray under the tongue every 5 (five) minutes x 3 doses as needed for chest pain. 01/02/15   Lelon Perla, MD  NON FORMULARY Chloroella    [provider]  OVER THE COUNTER MEDICATION Take 750 mg by mouth at bedtime. GABA    [provider]  OVER THE COUNTER MEDICATION as directed. Magnesium L-Treonate    [provider]  OVER THE COUNTER MEDICATION as directed. Omega EPA/DHA    [provider]  OVER THE COUNTER MEDICATION as directed. Grape fruit seed extract    [provider]  potassium chloride (K-DUR,KLOR-CON) 10 MEQ tablet Take 1 tablet (10 mEq total) by mouth 2 (two) times daily. 10/31/16   Emeterio Reeve, DO  torsemide ([name]) 20 MG tablet TAKE ONE TABLET BY MOUTH ONCE DAILY IN THE MORNING.  MAY  TAKE  AN  EXTRA  ONE-HALF  TABLET  AS  NEEDED 10/31/16   Emeterio Reeve, DO    Family History Family History  Problem Relation Age of Onset  . Breast cancer Mother   . Heart disease Father   . Hypertension Father   . Breast cancer Sister     Social History Social History   Tobacco Use  . Smoking status: Former Smoker    Types: Cigarettes  . Smokeless tobacco: Never Used  . Tobacco comment:  " Quit smoking cigarettes in 20's "  Substance Use Topics  . Alcohol use: Yes    Comment: occasional beer or wine  . Drug use: No     Allergies   Dilaudid [hydromorphone]; Morphine; Morphine and related; Penicillins; Tramadol; Tramadol-acetaminophen; Acetaminophen; Amitriptyline; Codeine; Gluten meal; Ibuprofen; Other; Propoxyphene; Statins;  Sulfamethoxazole; Wheat bran; Shellfish allergy; Sulfa antibiotics; Sulfites; and Trichophyton   Review of Systems Review of Systems  All other systems reviewed and are negative.    Physical Exam Triage Vital Signs ED Triage Vitals [08/22/17 1326]  Enc Vitals Group     BP (!) 153/72     Pulse Rate 65     Resp      Temp (!) 97.3 F (36.3 C)     Temp Source Oral     SpO2 100 %     Weight 96 lb (43.5 kg)     Height 4\' 11"  (1.499 m)     Head Circumference      Peak Flow      Pain Score 0     Pain Loc      Pain Edu?      Excl. in [place]?    No data found.  Updated Vital Signs BP (!) 153/72 (BP Location: Right Arm)   Pulse 65   Temp (!) 97.3 F (36.3 C) (Oral)   Ht 4\' 11"  (1.499 m)   Wt 96 lb (43.5 kg)   SpO2 100%   BMI 19.39 kg/m   Visual Acuity Right Eye Distance:   Left Eye Distance:   Bilateral Distance:    Right Eye Near:   Left Eye Near:    Bilateral Near:     Physical Exam  Constitutional: She appears well-developed and well-nourished.  Musculoskeletal: She exhibits tenderness.  Left foot looks normal, diffusely tender, no redness no swelling   Neurological: She is alert.  Skin: Skin is warm.  Psychiatric: She has a normal mood and affect.  Nursing note and vitals reviewed.    UC Treatments / Results  Labs (all labs ordered are listed, but only abnormal results are displayed) Labs Reviewed - No data to display  EKG None  Radiology Dg Foot Complete Left  Result Date: 08/22/2017 CLINICAL DATA:  Injury. EXAM: LEFT FOOT - COMPLETE 3+ VIEW COMPARISON:  08/26/2016. FINDINGS: No acute bony or joint abnormality identified. No evidence of fracture dislocation. Peripheral vascular calcification. IMPRESSION: 1.  No acute bony abnormality identified.  2.  Peripheral vascular disease. Electronically Signed   By: Marcello Moores  Register   On: 08/22/2017 14:19    Procedures Procedures (including critical care time)  Medications Ordered in UC Medications - No data  to display  Initial Impression / Assessment and Plan / UC Course  I have reviewed the triage vital signs and the nursing notes.  Pertinent labs & imaging results that were available during my care of the patient were reviewed by me and considered in my medical decision making (see chart for details).     MDM  Xray is normal, Pt counseled on contusions.  Final Clinical Impressions(s) / UC Diagnoses   Final diagnoses:  Foot pain, left     Discharge Instructions     See your Physician for recheck if pain persist    ED Prescriptions    None     Controlled Substance Prescriptions [place] Controlled Substance Registry consulted? Not Applicable   Fransico Meadow, [place] 08/23/17 1807

## 2017-09-15 ENCOUNTER — Telehealth: Payer: Self-pay

## 2017-09-15 DIAGNOSIS — Z8619 Personal history of other infectious and parasitic diseases: Secondary | ICD-10-CM

## 2017-09-15 NOTE — Telephone Encounter (Signed)
Pt called requesting a referral for an Infectious Disease Specialist. Pt prefers [facility] located in [place]. Thanks.

## 2017-09-16 NOTE — Telephone Encounter (Signed)
Orders in 

## 2017-09-18 NOTE — Telephone Encounter (Signed)
Unable to contact pt, phone line is currently down. Will attempt later on today.

## 2017-09-19 NOTE — Telephone Encounter (Signed)
Pt has been updated.  

## 2017-10-10 NOTE — Progress Notes (Deleted)
HPI: FU CAD. Prior MI 1999; H/O CABG (LIMA to LAD; SVG to RCA); According to the patient, she had 2 stents to the LIMA graft and the LAD in 2000. She's had EECP treatments in [place]. Her last heart catheterization was August 2015 at [facility] which revealed two-vessel coronary disease with a patent LIMA to the LAD and SVG to the RCA. No obstructive disease in left circumflex; EF 65. Echocardiogram January 2019 showed normal LV function.  Patient seen in June 2019 with multiple complaints.  Echo and nuclear study ordered but not performed.  Since last seen,   Current Outpatient Medications  Medication Sig Dispense Refill  . acyclovir (ZOVIRAX) 400 MG tablet Take 1 tablet (400 mg total) by mouth 2 (two) times daily. 180 tablet 3  . Alpha-Lipoic Acid 600 MG CAPS Take 600 mg by mouth 2 (two) times daily.     Marland Kitchen arginine 500 MG tablet Take 500 mg by mouth daily.    . Armodafinil 250 MG tablet Take 1/2 to 1 pill in the mornings 45 tablet 1  . aspirin EC 81 MG EC tablet Take 1 tablet (81 mg total) by mouth daily.    . carvedilol (COREG) 3.125 MG tablet Take 1 tablet (3.125 mg total) by mouth 2 (two) times daily with a meal. 180 tablet 3  . cetirizine (ZYRTEC) 10 MG tablet Take 10 mg by mouth at bedtime.    . Cholecalciferol (VITAMIN D3) 5000 UNITS TABS Take 1,000 Units by mouth daily.     . cyclobenzaprine (FLEXERIL) 10 MG tablet Take 0.5-1 tablets (5-10 mg total) by mouth 3 (three) times daily as needed for muscle spasms. Caution: can cause drowsiness 60 tablet 1  . Esomeprazole Magnesium (NEXIUM PO) Take 20 mg by mouth every morning.     . fluticasone (FLONASE) 50 MCG/ACT nasal spray Place 2 sprays into both nostrils daily. 16 g 11  . Insulin Human (INSULIN PUMP) SOLN Inject into the skin continuous. Novolog insulin    . levothyroxine (SYNTHROID, LEVOTHROID) 25 MCG tablet Take 1 tablet (25 mcg total) by mouth daily before breakfast. 90 tablet 3  . Linoleic Acid Conjugated 1000 MG  CAPS Take 1,000 mg by mouth daily.     . Methylsulfonylmethane (MSM) 1000 MG TABS Take by mouth. 1-4 times a day    . nitroGLYCERIN (NITROLINGUAL) 0.4 MG/SPRAY spray Place 1 spray under the tongue every 5 (five) minutes x 3 doses as needed for chest pain. 12 g 1  . NON FORMULARY Chloroella    . OVER THE COUNTER MEDICATION Take 750 mg by mouth at bedtime. GABA    . OVER THE COUNTER MEDICATION as directed. Magnesium L-Treonate    . OVER THE COUNTER MEDICATION as directed. Omega EPA/DHA    . OVER THE COUNTER MEDICATION as directed. Grape fruit seed extract    . potassium chloride (K-DUR,KLOR-CON) 10 MEQ tablet Take 1 tablet (10 mEq total) by mouth 2 (two) times daily. 180 tablet 3  . torsemide ([name]) 20 MG tablet TAKE ONE TABLET BY MOUTH ONCE DAILY IN THE MORNING.  MAY  TAKE  AN  EXTRA  ONE-HALF  TABLET  AS  NEEDED 135 tablet 3   No current facility-administered medications for this visit.      Past Medical History:  Diagnosis Date  . Anemia   . Arthritis   . 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 Chronic kidney disease    stage 3  .  Coronary artery disease   . Depression   . Diabetes mellitus without complication (HCC)    Type 1  . Diabetic retinopathy ([place])   . Family history of adverse reaction to anesthesia    mother: " while she was under she stopped breathing."  . Fibromyalgia   . Gastroparesis   . GERD (gastroesophageal reflux disease)   . Headache    migraines  . Hypothyroidism   . IBS (irritable bowel syndrome)   . Idiopathic edema   . Lyme disease   . Mitral valve prolapse   . Myocardial infarction ([place])    1 major in 1999 and 2 minor " small vessel disease."  . Osteoporosis   . Peripheral neuropathy   . Peripheral vascular disease ([place])   . 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 Stroke [facility])    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" " the second stroke was a lacunar     Past Surgical History:  Procedure Laterality Date  .  ABDOMINAL HYSTERECTOMY    . APPENDECTOMY    . BREAST SURGERY     B/L biopsy and lumpectomy   . CARDIAC CATHETERIZATION    . CARPAL TUNNEL RELEASE    . CATARACT EXTRACTION W/ INTRAOCULAR LENS IMPLANT     right eye  . COLONOSCOPY W/ BIOPSIES AND POLYPECTOMY    . CORONARY ARTERY BYPASS GRAFT    . coronary artery stents     at LAD and LIMA  . ECTOPIC PREGNANCY SURGERY    . NASAL SEPTUM SURGERY    . OPEN REDUCTION INTERNAL FIXATION (ORIF) DISTAL RADIAL FRACTURE Right 05/06/2015   Procedure: OPEN REDUCTION INTERNAL FIXATION (ORIF) RIGHT DISTAL RADIAL FRACTURE AND REPAIRS AS NEEDED;  Surgeon: Iran Planas, MD;  Location: [facility];  Service: Orthopedics;  Laterality: Right;  . ORIF WRIST FRACTURE Left 11/05/2014  . ORIF WRIST FRACTURE Left 11/05/2014   Procedure: OPEN REDUCTION INTERNAL FIXATION (ORIF) LEFT WRIST FRACTURE AND REPAIR AS INDICATED;  Surgeon: Iran Planas, MD;  Location: [facility];  Service: Orthopedics;  Laterality: Left;  . TRIGGER FINGER RELEASE      Social History   Socioeconomic History  . Marital status: Married    Spouse name: Not on file  . Number of children: 0  . Years of education: college  . Highest education level: Not on file  Occupational History  . Occupation: Retired  Scientific laboratory technician  . Financial resource strain: Not on file  . Food insecurity:    Worry: Not on file    Inability: Not on file  . Transportation needs:    Medical: Not on file    Non-medical: Not on file  Tobacco Use  . Smoking status: Former Smoker    Types: Cigarettes  . Smokeless tobacco: Never Used  . Tobacco comment:  " Quit smoking cigarettes in 20's "  Substance and Sexual Activity  . Alcohol use: Yes    Comment: occasional beer or wine  . Drug use: No  . Sexual activity: Not on file  Lifestyle  . Physical activity:    Days per week: Not on file    Minutes per session: Not on file  . Stress: Not on file  Relationships  . Social connections:    Talks on phone: Not on file     Gets together: Not on file    Attends religious service: Not on file    Active member of club or organization: Not on file  Attends meetings of clubs or organizations: Not on file    Relationship status: Not on file  . Intimate partner violence:    Fear of current or ex partner: Not on file    Emotionally abused: Not on file    Physically abused: Not on file    Forced sexual activity: Not on file  Other Topics Concern  . Not on file  Social History Narrative   Drinks 2-4 caffeien drinks a day     Family History  Problem Relation Age of Onset  . Breast cancer Mother   . Heart disease Father   . Hypertension Father   . Breast cancer Sister     ROS: no fevers or chills, productive cough, hemoptysis, dysphasia, odynophagia, melena, hematochezia, dysuria, hematuria, rash, seizure activity, orthopnea, PND, pedal edema, claudication. Remaining systems are negative.  Physical Exam: Well-developed well-nourished in no acute distress.  Skin is warm and dry.  HEENT is normal.  Neck is supple.  Chest is clear to auscultation with normal expansion.  Cardiovascular exam is regular rate and rhythm.  Abdominal exam nontender or distended. No masses palpated. Extremities show no edema. neuro grossly intact  ECG- personally reviewed  A/P  1  Brittney Ruths, MD

## 2017-10-16 ENCOUNTER — Other Ambulatory Visit: Payer: Self-pay | Admitting: Osteopathic Medicine

## 2017-10-16 DIAGNOSIS — I5032 Chronic diastolic (congestive) heart failure: Secondary | ICD-10-CM

## 2017-10-18 ENCOUNTER — Ambulatory Visit: Payer: BLUE CROSS/BLUE SHIELD | Admitting: Cardiology

## 2017-11-13 ENCOUNTER — Other Ambulatory Visit: Payer: Self-pay | Admitting: Osteopathic Medicine

## 2017-11-14 NOTE — Telephone Encounter (Signed)
[facility] requesting med refill for armodafinil.

## 2017-11-14 NOTE — Telephone Encounter (Signed)
Pt has been updated. No other inquiries during call.  

## 2017-11-16 ENCOUNTER — Other Ambulatory Visit: Payer: Self-pay

## 2017-11-16 ENCOUNTER — Telehealth: Payer: Self-pay | Admitting: Cardiology

## 2017-11-16 DIAGNOSIS — I2583 Coronary atherosclerosis due to lipid rich plaque: Principal | ICD-10-CM

## 2017-11-16 DIAGNOSIS — I2510 Atherosclerotic heart disease of native coronary artery without angina pectoris: Secondary | ICD-10-CM

## 2017-11-16 DIAGNOSIS — I5032 Chronic diastolic (congestive) heart failure: Secondary | ICD-10-CM

## 2017-11-16 MED ORDER — POTASSIUM CHLORIDE CRYS ER 10 MEQ PO TBCR: 10.0000 meq | EXTENDED_RELEASE_TABLET | Freq: Two times a day (BID) | ORAL | 0 refills | Status: DC

## 2017-11-16 NOTE — Telephone Encounter (Signed)
New Message:     Pt said she is supposed to have an Echo. I told her we would schedule is as soon as the order is put in.

## 2018-01-05 ENCOUNTER — Telehealth: Payer: Self-pay

## 2018-01-05 NOTE — Telephone Encounter (Signed)
New message    The patient called and left a message on echo & nuclear voice mail  she has not heard from anyone regarding rescheduling her echo appt.    In the past section on, June 27th   patient had cancel her echo / nuclear appt   patient is going to wait   The patient is asking for a callback from the nurse.

## 2018-01-14 ENCOUNTER — Other Ambulatory Visit: Payer: Self-pay | Admitting: Osteopathic Medicine

## 2018-01-14 DIAGNOSIS — I5032 Chronic diastolic (congestive) heart failure: Secondary | ICD-10-CM

## 2018-01-15 ENCOUNTER — Telehealth: Payer: Self-pay | Admitting: Cardiology

## 2018-01-15 NOTE — Telephone Encounter (Signed)
Spoke with pt who states she was returning a phone call. Informed pt that it doesn't indicate we have tried to call her but it may have been a scheduler confirming her ECHO appointment. Verbalized understanding,

## 2018-01-15 NOTE — Telephone Encounter (Signed)
New message     Pt returning a phone call and not sure what/who the call was regarding . Please follow up

## 2018-01-19 ENCOUNTER — Other Ambulatory Visit (HOSPITAL_COMMUNITY): Payer: BLUE CROSS/BLUE SHIELD

## 2018-01-25 ENCOUNTER — Ambulatory Visit (HOSPITAL_COMMUNITY): Payer: 59 | Attending: Cardiovascular Disease

## 2018-01-25 ENCOUNTER — Other Ambulatory Visit: Payer: Self-pay

## 2018-01-25 DIAGNOSIS — R079 Chest pain, unspecified: Secondary | ICD-10-CM | POA: Insufficient documentation

## 2018-01-25 DIAGNOSIS — I519 Heart disease, unspecified: Secondary | ICD-10-CM | POA: Insufficient documentation

## 2018-01-25 DIAGNOSIS — R0602 Shortness of breath: Secondary | ICD-10-CM

## 2018-01-25 DIAGNOSIS — R5383 Other fatigue: Secondary | ICD-10-CM | POA: Diagnosis not present

## 2018-01-26 ENCOUNTER — Other Ambulatory Visit (HOSPITAL_COMMUNITY): Payer: BLUE CROSS/BLUE SHIELD

## 2018-02-04 ENCOUNTER — Other Ambulatory Visit: Payer: Self-pay | Admitting: Osteopathic Medicine

## 2018-02-04 DIAGNOSIS — I2510 Atherosclerotic heart disease of native coronary artery without angina pectoris: Secondary | ICD-10-CM

## 2018-02-04 DIAGNOSIS — I5032 Chronic diastolic (congestive) heart failure: Secondary | ICD-10-CM

## 2018-02-04 DIAGNOSIS — I2583 Coronary atherosclerosis due to lipid rich plaque: Principal | ICD-10-CM

## 2018-02-09 ENCOUNTER — Telehealth: Payer: Self-pay

## 2018-02-09 MED ORDER — ARMODAFINIL 250 MG PO TABS: ORAL_TABLET | ORAL | 1 refills | Status: DC

## 2018-02-09 NOTE — Telephone Encounter (Signed)
Will address at next visit - doesn't look like neuro discussed this Rx

## 2018-02-09 NOTE — Progress Notes (Signed)
HPI: FU CAD. Patient previously followed at [place]; H/O CABG (LIMA to LAD; SVG to RCA); According to the patient, she had 2 stents to the LIMA graft and the LAD in 2000. She's had EECP treatments in [place]. Her last heart catheterization was August 2015 at [facility] which revealed two-vessel coronary disease with a patent LIMA to the LAD and SVG to the RCA. No obstructive disease in left circumflex; EF 65.  Nuclear study ordered June 2019 but not performed.  Last echocardiogram January 2020 showed normal LV function, systolic bowing of mitral valve without frank prolapse.  Also with history of stroke.  Since last seen, she does have dyspnea on exertion which is chronic by her husband's report.  No orthopnea, PND or pedal edema.  She does not have chest pain.  She complains of fatigue.  Current Outpatient Medications  Medication Sig Dispense Refill  . acyclovir (ZOVIRAX) 400 MG tablet Take 1 tablet (400 mg total) by mouth 2 (two) times daily. 180 tablet 3  . Alpha-Lipoic Acid 600 MG CAPS Take 600 mg by mouth 2 (two) times daily.     Marland Kitchen arginine 500 MG tablet Take 500 mg by mouth daily.    . Armodafinil 250 MG tablet TAKE 1/2 TO 1 (ONE-HALF TO ONE) TABLET BY MOUTH ONCE DAILY IN THE MORNING 45 tablet 1  . aspirin EC 81 MG EC tablet Take 1 tablet (81 mg total) by mouth daily.    . carvedilol (COREG) 3.125 MG tablet TAKE 1 TABLET TWICE DAILY  WITH MEALS. 180 tablet 0  . cetirizine (ZYRTEC) 10 MG tablet Take 10 mg by mouth at bedtime.    . Cholecalciferol (VITAMIN D3) 5000 UNITS TABS Take 1,000 Units by mouth daily.     . cyclobenzaprine (FLEXERIL) 10 MG tablet Take 0.5-1 tablets (5-10 mg total) by mouth 3 (three) times daily as needed for muscle spasms. Caution: can cause drowsiness 60 tablet 1  . Esomeprazole Magnesium (NEXIUM PO) Take 20 mg by mouth every morning.     . fluticasone (FLONASE) 50 MCG/ACT nasal spray Place 2 sprays into both nostrils daily. 16 g 11    . Insulin Human (INSULIN PUMP) SOLN Inject into the skin continuous. Novolog insulin    . Linoleic Acid Conjugated 1000 MG CAPS Take 1,000 mg by mouth daily.     . Methylsulfonylmethane (MSM) 1000 MG TABS Take by mouth. 1-4 times a day    . nitroGLYCERIN (NITROLINGUAL) 0.4 MG/SPRAY spray Place 1 spray under the tongue every 5 (five) minutes x 3 doses as needed for chest pain. 12 g 1  . NON FORMULARY Chloroella    . OVER THE COUNTER MEDICATION Take 750 mg by mouth at bedtime. GABA    . OVER THE COUNTER MEDICATION as directed. Magnesium L-Treonate    . OVER THE COUNTER MEDICATION as directed. Omega EPA/DHA    . OVER THE COUNTER MEDICATION as directed. Grape fruit seed extract    . potassium chloride (KLOR-CON M10) 10 MEQ tablet Take 1 tablet (10 mEq total) by mouth 2 (two) times daily. No more refills until appt is scheduled 180 tablet 0  . SYNTHROID 25 MCG tablet TAKE 1 TABLET DAILY BEFORE BREAKFAST. 90 tablet 0  . torsemide ([name]) 20 MG tablet TAKE 1 TABLET ONCE DAILY INTHE MORNING. MAY TAKE AN   EXTRA 1/2 TABLET AS        NEEDED 135 tablet 0   No current facility-administered medications for  this visit.      Past Medical History:  Diagnosis Date  . Anemia   . Arthritis   . 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 Chronic kidney disease    stage 3  . Coronary artery disease   . Depression   . Diabetes mellitus without complication (HCC)    Type 1  . Diabetic retinopathy ([place])   . Family history of adverse reaction to anesthesia    mother: " while she was under she stopped breathing."  . Fibromyalgia   . Gastroparesis   . GERD (gastroesophageal reflux disease)   . Headache    migraines  . Hypothyroidism   . IBS (irritable bowel syndrome)   . Idiopathic edema   . Lyme disease   . Mitral valve prolapse   . Myocardial infarction ([place])    1 major in 1999 and 2 minor " small vessel disease."  . Osteoporosis   . Peripheral neuropathy   . Peripheral vascular disease  ([place])   . 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 Stroke [facility])    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" " the second stroke was a lacunar     Past Surgical History:  Procedure Laterality Date  . ABDOMINAL HYSTERECTOMY    . APPENDECTOMY    . BREAST SURGERY     B/L biopsy and lumpectomy   . CARDIAC CATHETERIZATION    . CARPAL TUNNEL RELEASE    . CATARACT EXTRACTION W/ INTRAOCULAR LENS IMPLANT     right eye  . COLONOSCOPY W/ BIOPSIES AND POLYPECTOMY    . CORONARY ARTERY BYPASS GRAFT    . coronary artery stents     at LAD and LIMA  . ECTOPIC PREGNANCY SURGERY    . NASAL SEPTUM SURGERY    . OPEN REDUCTION INTERNAL FIXATION (ORIF) DISTAL RADIAL FRACTURE Right 05/06/2015   Procedure: OPEN REDUCTION INTERNAL FIXATION (ORIF) RIGHT DISTAL RADIAL FRACTURE AND REPAIRS AS NEEDED;  Surgeon: Iran Planas, MD;  Location: [place];  Service: Orthopedics;  Laterality: Right;  . ORIF WRIST FRACTURE Left 11/05/2014  . ORIF WRIST FRACTURE Left 11/05/2014   Procedure: OPEN REDUCTION INTERNAL FIXATION (ORIF) LEFT WRIST FRACTURE AND REPAIR AS INDICATED;  Surgeon: Iran Planas, MD;  Location: [place];  Service: Orthopedics;  Laterality: Left;  . TRIGGER FINGER RELEASE      Social History   Socioeconomic History  . Marital status: Married    Spouse name: Not on file  . Number of children: 0  . Years of education: college  . Highest education level: Not on file  Occupational History  . Occupation: Retired  Scientific laboratory technician  . Financial resource strain: Not on file  . Food insecurity:    Worry: Not on file    Inability: Not on file  . Transportation needs:    Medical: Not on file    Non-medical: Not on file  Tobacco Use  . Smoking status: Former Smoker    Types: Cigarettes  . Smokeless tobacco: Never Used  . Tobacco comment:  " Quit smoking cigarettes in 20's "  Substance and Sexual Activity  . Alcohol use: Yes    Comment: occasional beer or wine  . Drug use: No  . Sexual  activity: Not on file  Lifestyle  . Physical activity:    Days per week: Not on file    Minutes per session: Not on file  . Stress: Not on file  Relationships  . Social connections:    Talks on phone: Not on file    Gets together: Not on file    Attends religious service: Not on file    Active member of club or organization: Not on file    Attends meetings of clubs or organizations: Not on file    Relationship status: Not on file  . Intimate partner violence:    Fear of current or ex partner: Not on file    Emotionally abused: Not on file    Physically abused: Not on file    Forced sexual activity: Not on file  Other Topics Concern  . Not on file  Social History Narrative   Drinks 2-4 caffeien drinks a day     Family History  Problem Relation Age of Onset  . Breast cancer Mother   . Heart disease Father   . Hypertension Father   . Breast cancer Sister     ROS: Fatigue, muscle weaknesses, cough but no fevers or chills, hemoptysis, dysphasia, odynophagia, melena, hematochezia, dysuria, hematuria, rash, seizure activity, orthopnea, PND, pedal edema, claudication. Remaining systems are negative.  Physical Exam: Well-developed well-nourished in no acute distress.  Skin is warm and dry.  HEENT is normal.  Neck is supple.  Chest is clear to auscultation with normal expansion.  Cardiovascular exam is regular rate and rhythm.  Abdominal exam nontender or distended. No masses palpated. Extremities show no edema. neuro grossly intact  ECG-sinus rhythm at a rate of 67, right axis deviation, nonspecific ST changes.  Personally reviewed  A/P  1 coronary artery disease-continue aspirin.  Patient is intolerant to statins.  Patient would like to follow-up with her cardiologist in [facility] for EECP which apparently benefited her in the past.  2 hyperlipidemia-patient is intolerant to statins and Zetia.  Check lipids.  If LDL elevated will consider Repatha.  3 history of diastolic  congestive heart failure-continue present dose of diuretic.  She is euvolemic on examination.  Check potassium and renal function.  4 diabetes mellitus-Per primary care.  Kirk Ruths, MD

## 2018-02-09 NOTE — Telephone Encounter (Signed)
[facility] requesting med refill for armodafinil. As per OV on 06/08/17 - requested provider to refill this medication, until she can get a neurologist. Pls advise if refill appropriate.

## 2018-02-12 NOTE — Telephone Encounter (Signed)
Received fax that [employer] was approved from 02/12/2018 through 02/12/2019. Pharmacy aware and form sent to scan.

## 2018-02-12 NOTE — Telephone Encounter (Signed)
Received fax from Covermymeds that Armodafinal requires a PA. Information has been sent to the insurance company. Awaiting determination.   Left message for patient to call and set up a follow up with PCP to discuss the medication per previous note.

## 2018-02-13 ENCOUNTER — Encounter: Payer: Self-pay | Admitting: Osteopathic Medicine

## 2018-02-13 ENCOUNTER — Ambulatory Visit (INDEPENDENT_AMBULATORY_CARE_PROVIDER_SITE_OTHER): Payer: 59 | Admitting: Osteopathic Medicine

## 2018-02-13 VITALS — BP 142/60 | HR 70 | Temp 98.2°F | Wt 99.3 lb

## 2018-02-13 DIAGNOSIS — Z8619 Personal history of other infectious and parasitic diseases: Secondary | ICD-10-CM | POA: Diagnosis not present

## 2018-02-13 DIAGNOSIS — F338 Other recurrent depressive disorders: Secondary | ICD-10-CM

## 2018-02-13 DIAGNOSIS — R5382 Chronic fatigue, unspecified: Secondary | ICD-10-CM

## 2018-02-13 DIAGNOSIS — R5381 Other malaise: Secondary | ICD-10-CM | POA: Diagnosis not present

## 2018-02-13 MED ORDER — ARMODAFINIL 250 MG PO TABS: 125.0000 mg | ORAL_TABLET | Freq: Every day | ORAL | 1 refills | Status: DC

## 2018-02-13 NOTE — Progress Notes (Signed)
HPI: Brittney Pace is a 63 y.o. female who  has a past medical history of Anemia, Arthritis, Babesiasis, Chronic kidney disease, Coronary artery disease, Depression, Diabetes mellitus without complication ([place]), Diabetic retinopathy ([place]), Family history of adverse reaction to anesthesia, Fibromyalgia, Gastroparesis, GERD (gastroesophageal reflux disease), Headache, Hypothyroidism, IBS (irritable bowel syndrome), Idiopathic edema, Lyme disease, Mitral valve prolapse, Myocardial infarction ([place]), Osteoporosis, Peripheral neuropathy, Peripheral vascular disease ([place]), Sinus disorder, and Stroke ([place]).  she presents to [facility] today, 02/13/18,  for chief complaint of:  Med Refill  Patient previously receiving armodafinil prescription from neurology.History of severe fatigue amd daytime somnolence. Multiple medical comorbidities contributing to chronic fatigue. She had asked 05/2017 if I was okay to refill this medication until she could get in to discuss further with her new neurologist,she was not particularly happy with previous prescriber's bedside manner. She has new neurologist but this medication was not discussed. We have been refilling it here but has been >6 mos since she was seen.   Patient relatively stable since last appointment.  She is still following with multiple specialists.  Apparently recently seen by infectious disease who said that there was not really any evidence to support a diagnosis of Lyme or Babesia.  She was a bit displeased with this information.  She is currently going to [facility].  Fatigue is still a daily issue for her, worse over the past couple of months with seasonal affective disorder.      Past medical history, surgical history, and family history reviewed.  Current medication list and allergy/intolerance information reviewed.   (See remainder of HPI, ROS, Phys Exam below)    ASSESSMENT/PLAN: The  primary encounter diagnosis was Chronic fatigue. Diagnoses of History of Lyme disease, Physical debility, and Seasonal affective disorder ([place]) were also pertinent to this visit.    Meds ordered this encounter  Medications  . Armodafinil 250 MG tablet    Sig: Take 0.5 tablets (125 mg total) by mouth daily.    Dispense:  45 tablet    Refill:  1    There are no Patient Instructions on file for this visit.     Follow-up plan: Return in about 6 months (around 08/14/2018) for [place], maintain controlled substance Rx, see me sooner if needed! .        ############################################ ############################################ ############################################ ############################################    Outpatient Encounter Medications as of 02/13/2018  Medication Sig  . acyclovir (ZOVIRAX) 400 MG tablet Take 1 tablet (400 mg total) by mouth 2 (two) times daily.  . Alpha-Lipoic Acid 600 MG CAPS Take 600 mg by mouth 2 (two) times daily.   Marland Kitchen arginine 500 MG tablet Take 500 mg by mouth daily.  . Armodafinil 250 MG tablet Take 0.5 tablets (125 mg total) by mouth daily.  . Aspirin-Calcium Carbonate 81-777 MG TABS Take by mouth.  . carvedilol (COREG) 3.125 MG tablet TAKE 1 TABLET TWICE DAILY  WITH MEALS.  . cetirizine (ZYRTEC) 10 MG tablet Take 10 mg by mouth at bedtime.  . Cholecalciferol (VITAMIN D3) 5000 UNITS TABS Take 1,000 Units by mouth daily.   . cyclobenzaprine (FLEXERIL) 10 MG tablet Take 0.5-1 tablets (5-10 mg total) by mouth 3 (three) times daily as needed for muscle spasms. Caution: can cause drowsiness  . ELDERBERRY PO Take by mouth.  . Esomeprazole Magnesium (NEXIUM PO) Take 20 mg by mouth every morning.   . fluocinonide (LIDEX) 0.05 % external solution APPLY SOLUTION TOPICALLY ONCE DAILY AS NEEDED TO  SCALP  . folic acid (FOLVITE) 1 MG tablet Take by mouth.  . Insulin Human (INSULIN PUMP) SOLN Inject into the skin continuous. Novolog  insulin  . Linoleic Acid Conjugated 1000 MG CAPS Take 1,000 mg by mouth daily.   Marland Kitchen MELATONIN PO Take by mouth.  . Methylsulfonylmethane (MSM) 1000 MG TABS Take by mouth. 1-4 times a day  . NATURE-THROID 65 MG tablet   . nitroGLYCERIN (NITROLINGUAL) 0.4 MG/SPRAY spray Place 1 spray under the tongue every 5 (five) minutes x 3 doses as needed for chest pain.  . NON FORMULARY Chloroella  . OVER THE COUNTER MEDICATION Take 750 mg by mouth at bedtime. GABA  . OVER THE COUNTER MEDICATION as directed. Magnesium L-Treonate  . OVER THE COUNTER MEDICATION as directed. Omega EPA/DHA  . OVER THE COUNTER MEDICATION as directed. Grape fruit seed extract  . potassium chloride (KLOR-CON M10) 10 MEQ tablet Take 1 tablet (10 mEq total) by mouth 2 (two) times daily. No more refills until appt is scheduled  . SYNTHROID 25 MCG tablet TAKE 1 TABLET DAILY BEFORE BREAKFAST.  Marland Kitchen torsemide ([name]) 20 MG tablet TAKE 1 TABLET ONCE DAILY INTHE MORNING. MAY TAKE AN   EXTRA 1/2 TABLET AS        NEEDED  . [DISCONTINUED] Armodafinil 250 MG tablet TAKE 1/2 TO 1 (ONE-HALF TO ONE) TABLET BY MOUTH ONCE DAILY IN THE MORNING  . [DISCONTINUED] aspirin EC 81 MG EC tablet Take 1 tablet (81 mg total) by mouth daily. (Patient not taking: Reported on 02/14/2018)  . [DISCONTINUED] fluticasone (FLONASE) 50 MCG/ACT nasal spray Place 2 sprays into both nostrils daily. (Patient not taking: Reported on 02/14/2018)  . [DISCONTINUED] gabapentin (NEURONTIN) 300 MG capsule Take one pill QHS x 1 week.  Increase to 2 pills QHS x 1 week.  Increase to 3 pills QHSuntil seen  . [DISCONTINUED] metroNIDAZOLE (FLAGYL) 250 MG tablet TAKE 1 TABLET BY MOUTH THREE TIMES DAILY FOR 14 DAYS  . [DISCONTINUED] thyroid (ARMOUR) 32.5 MG tablet Take by mouth.   No facility-administered encounter medications on file as of 02/13/2018.    Allergies  Allergen Reactions  . Dilaudid [Hydromorphone] Other (See Comments)    Because of continuous glucose monitor  . Morphine  Anaphylaxis and Shortness Of Breath    Other reaction(s): Respiratory Distress (ALLERGY/intolerance)  . Morphine And Related Shortness Of Breath  . Penicillins Hives    Has patient had a PCN reaction causing immediate rash, facial/tongue/throat swelling, SOB or lightheadedness with hypotension: Yes Has patient had a PCN reaction causing severe rash involving mucus membranes or skin necrosis: No Has patient had a PCN reaction that required hospitalization No Has patient had a PCN reaction occurring within the last 10 years: No If all of the above answers are "NO", then may proceed with Cephalosporin use. Has patient had a PCN reaction causing immediate rash, facial/tongue/throat swelling, SOB or lightheadedness with hypotension: Yes Has patient had a PCN reaction causing severe rash involving mucus membranes or skin necrosis: No Has patient had a PCN reaction that required hospitalization No Has patient had a PCN reaction occurring within the last 10 years: No If all of the above answers are "NO", then may proceed with Cephalosporin use.   . Tramadol Anaphylaxis  . Tramadol-Acetaminophen Other (See Comments)    Small vessel heart attack Other reaction(s): Cardiovascular Arrest (ALLERGY/intolerance), Other (See Comments)   . Acetaminophen Other (See Comments)    Alters insulin pump readings   . Amitriptyline Other (See Comments)  Severe headache/ out of body feeling  . Codeine Other (See Comments)    Severe headaches/ out of body feeling   . Gluten Meal Diarrhea and Nausea And Vomiting    Cramping   . Ibuprofen Other (See Comments)    Messes up CGM reading on glucose monitor Other reaction(s): Other, Other (See Comments)   . Other Swelling and Other (See Comments)    Cats itching Mold sinuses Cat hair and scratches cause swelling    . Propoxyphene Other (See Comments)    Severe headaches / out of body feeling   . Statins Other (See Comments)    Muscle pains Other  reaction(s): Other  . Sulfamethoxazole Other (See Comments)    Mouth ulcers   . Wheat Bran Diarrhea and Nausea And Vomiting    Cramping   . Shellfish Allergy Diarrhea  . Sulfa Antibiotics Other (See Comments)    Mouth ulcers  . Sulfites Other (See Comments)    Mouth ulcers  . Trichophyton Itching and Other (See Comments)    sinusitis       Review of Systems:  Constitutional: No recent illness  HEENT: No headache, no vision change  Cardiac: No  chest pain, No  pressure, No palpitations  Respiratory:  No  shortness of breath. No  Cough  Gastrointestinal: No  abdominal pain, no change on bowel habits  Musculoskeletal: No new myalgia/arthralgia  Skin: No  Rash  Neurologic: +generalized nonfocal weakness, No  Dizziness   Exam:  BP (!) 142/60 (BP Location: Left Arm, Patient Position: Sitting, Cuff Size: Normal)   Pulse 70   Temp 98.2 F (36.8 C) (Oral)   Wt 99 lb 4.8 oz (45 kg)   BMI 20.06 kg/m   Constitutional: VS see above. General Appearance: alert, well-developed, well-nourished, NAD  Eyes: Normal lids and conjunctive, non-icteric sclera  Ears, Nose, Mouth, Throat: MMM, Normal external inspection ears/nares/mouth/lips/gums.  Neck: No masses, trachea midline.   Respiratory: Normal respiratory effort.    Musculoskeletal: Gait normal. Symmetric and independent movement of all extremities  Neurological: Normal balance/coordination. No tremor.  Skin: warm, dry, intact.   Psychiatric: Normal judgment/insight. Normal mood and affect. Oriented x3.   Visit summary with medication list and pertinent instructions was printed for patient to review, advised to alert [place] if any changes needed. All questions at time of visit were answered - patient instructed to contact office with any additional concerns. ER/RTC precautions were reviewed with the patient and understanding verbalized.   Follow-up plan: Return in about 6 months (around 08/14/2018) for [place],  maintain controlled substance Rx, see me sooner if needed! .  Note: Total time spent 25 minutes, greater than 50% of the visit was spent face-to-face counseling and coordinating care for the following: The primary encounter diagnosis was Chronic fatigue. Diagnoses of History of Lyme disease, Physical debility, and Seasonal affective disorder ([employer]) were also pertinent to this visit.Marland Kitchen  Please note: voice recognition software was used to produce this document, and typos may escape review. Please contact Dr. Sheppard Coil for any needed clarifications.

## 2018-02-14 ENCOUNTER — Encounter: Payer: Self-pay | Admitting: Cardiology

## 2018-02-14 ENCOUNTER — Ambulatory Visit (INDEPENDENT_AMBULATORY_CARE_PROVIDER_SITE_OTHER): Payer: 59 | Admitting: Cardiology

## 2018-02-14 ENCOUNTER — Encounter: Payer: Self-pay | Admitting: Osteopathic Medicine

## 2018-02-14 VITALS — BP 126/64 | HR 67 | Ht 59.0 in | Wt 98.8 lb

## 2018-02-14 DIAGNOSIS — E7800 Pure hypercholesterolemia, unspecified: Secondary | ICD-10-CM

## 2018-02-14 DIAGNOSIS — I5032 Chronic diastolic (congestive) heart failure: Secondary | ICD-10-CM | POA: Diagnosis not present

## 2018-02-14 DIAGNOSIS — I2510 Atherosclerotic heart disease of native coronary artery without angina pectoris: Secondary | ICD-10-CM | POA: Diagnosis not present

## 2018-02-14 NOTE — Patient Instructions (Signed)
Medication Instructions:   NO CHANGE  Labwork:  Your physician recommends that you return for lab work PRIOR TO EATING  Follow-Up:  Your physician recommends that you schedule a follow-up appointment in: [place]

## 2018-02-23 DIAGNOSIS — F338 Other recurrent depressive disorders: Secondary | ICD-10-CM | POA: Insufficient documentation

## 2018-02-23 DIAGNOSIS — N289 Disorder of kidney and ureter, unspecified: Secondary | ICD-10-CM | POA: Insufficient documentation

## 2018-02-23 DIAGNOSIS — J45909 Unspecified asthma, uncomplicated: Secondary | ICD-10-CM | POA: Insufficient documentation

## 2018-02-23 DIAGNOSIS — M1990 Unspecified osteoarthritis, unspecified site: Secondary | ICD-10-CM | POA: Insufficient documentation

## 2018-02-23 DIAGNOSIS — G5600 Carpal tunnel syndrome, unspecified upper limb: Secondary | ICD-10-CM | POA: Insufficient documentation

## 2018-02-23 DIAGNOSIS — K219 Gastro-esophageal reflux disease without esophagitis: Secondary | ICD-10-CM | POA: Insufficient documentation

## 2018-03-16 ENCOUNTER — Other Ambulatory Visit: Payer: Self-pay

## 2018-03-16 MED ORDER — CARVEDILOL 3.125 MG PO TABS: 3.1250 mg | ORAL_TABLET | Freq: Two times a day (BID) | ORAL | 0 refills | Status: DC

## 2018-03-21 ENCOUNTER — Encounter: Payer: Self-pay | Admitting: *Deleted

## 2018-04-25 ENCOUNTER — Other Ambulatory Visit: Payer: Self-pay

## 2018-04-25 MED ORDER — CARVEDILOL 3.125 MG PO TABS: 3.1250 mg | ORAL_TABLET | Freq: Two times a day (BID) | ORAL | 0 refills | Status: DC

## 2018-04-27 ENCOUNTER — Other Ambulatory Visit: Payer: Self-pay | Admitting: Adult Health

## 2018-04-27 MED ORDER — CARVEDILOL 3.125 MG PO TABS: 3.1250 mg | ORAL_TABLET | Freq: Two times a day (BID) | ORAL | 0 refills | Status: DC

## 2018-07-17 ENCOUNTER — Ambulatory Visit (INDEPENDENT_AMBULATORY_CARE_PROVIDER_SITE_OTHER): Payer: 59 | Admitting: Osteopathic Medicine

## 2018-07-17 ENCOUNTER — Encounter: Payer: Self-pay | Admitting: Osteopathic Medicine

## 2018-07-17 VITALS — BP 108/64 | HR 62 | Temp 97.4°F | Wt 95.0 lb

## 2018-07-17 DIAGNOSIS — E860 Dehydration: Secondary | ICD-10-CM

## 2018-07-17 DIAGNOSIS — E1022 Type 1 diabetes mellitus with diabetic chronic kidney disease: Secondary | ICD-10-CM

## 2018-07-17 DIAGNOSIS — I2510 Atherosclerotic heart disease of native coronary artery without angina pectoris: Secondary | ICD-10-CM

## 2018-07-17 DIAGNOSIS — M797 Fibromyalgia: Secondary | ICD-10-CM

## 2018-07-17 DIAGNOSIS — Z8673 Personal history of transient ischemic attack (TIA), and cerebral infarction without residual deficits: Secondary | ICD-10-CM

## 2018-07-17 DIAGNOSIS — Z789 Other specified health status: Secondary | ICD-10-CM

## 2018-07-17 DIAGNOSIS — R5382 Chronic fatigue, unspecified: Secondary | ICD-10-CM | POA: Diagnosis not present

## 2018-07-17 DIAGNOSIS — N183 Chronic kidney disease, stage 3 unspecified: Secondary | ICD-10-CM

## 2018-07-17 DIAGNOSIS — M791 Myalgia, unspecified site: Secondary | ICD-10-CM | POA: Diagnosis not present

## 2018-07-17 DIAGNOSIS — I2583 Coronary atherosclerosis due to lipid rich plaque: Secondary | ICD-10-CM

## 2018-07-17 DIAGNOSIS — R202 Paresthesia of skin: Secondary | ICD-10-CM

## 2018-07-17 DIAGNOSIS — R197 Diarrhea, unspecified: Secondary | ICD-10-CM

## 2018-07-17 NOTE — Progress Notes (Signed)
Virtual Visit via Video (App used: Doximity) Note  I connected with      Brittney Pace on 07/17/18 at  by a telemedicine application and verified that I am speaking with the correct person using two identifiers.  Patient is at home I am in office    I discussed the limitations of evaluation and management by telemedicine and the availability of in person appointments. The patient expressed understanding and agreed to proceed.  History of Present Illness: Brittney Pace is a 63 y.o. female who would like to discuss multiple issues - see below    Increased stress from going through home remodel.   Endocrine: hasnt' been as careful with her blood sugars.   HemOnc: Concern about chronic anemia, getting shots for this.   MSK: Walking up hill from getting her mail, concern for muscle aches in her legs.   GI: diarrhea, nausea.   Neuro: TIA symptoms x2 over the weekend, once on Saturday and once on Sunday, felt like L arm had a cramp, felt like there was a tourniquet around it, legs felt weak, felt dizzy, unable to walk, felt more on the L side. She checked and BP was low 90/40 something.   CV: BP low, she has stopped her torsemide. Still on Coreg.   Renal: called her nephrologist about the BP and was advised to go to ER.       Observations/Objective: BP 108/64 (Patient Position: Sitting, Cuff Size: Normal)   Pulse 62   Temp (!) 97.4 F (36.3 C) (Oral)   Wt 95 lb (43.1 kg)   BMI 19.19 kg/m  BP Readings from Last 3 Encounters:  07/17/18 108/64  02/14/18 126/64  02/13/18 (!) 142/60   Exam: Normal Speech.    Lab and Radiology Results No results found for this or any previous visit (from the past 72 hour(s)). No results found.     Assessment and Plan: 63 y.o. female with The primary encounter diagnosis was Chronic fatigue. Diagnoses of CKD stage 3 due to type 1 diabetes mellitus ([place]), Myalgia, Statin intolerance, Diarrhea, unspecified type, History of  transient ischemic attack (TIA), Dehydration, Fibromyalgia, and Paresthesia were also pertinent to this visit.  Nonspecific symptoms, I'm not convinced that the pains she was experiencing this weekend are consistent w/ TIA butweakness history is concerning if truly focal symptoms, pt counseled on stroke/TIA symptoms and advised go to hospital if ny of this happens.   Would agree that home BP monitor needs to be verified and pt educated multiple possible reasons for hypotension which I cannot thoroughly evaluate over the phone. Will get labs at least. Pt very reluctant to come in to office.    PDMP not reviewed this encounter. Orders Placed This Encounter  Procedures  . CBC with Differential/Platelet  . COMPLETE METABOLIC PANEL WITH GFR  . TSH  . Magnesium  . Urinalysis, Routine w reflex microscopic  . Vitamin B12  . Folate RBC  . VITAMIN D 25 Hydroxy (Vit-D Deficiency, Fractures)     Follow Up Instructions: No follow-ups on file.    I discussed the assessment and treatment plan with the patient. The patient was provided an opportunity to ask questions and all were answered. The patient agreed with the plan and demonstrated an understanding of the instructions.   The patient was advised to call back or seek an in-person evaluation if any new concerns, if symptoms worsen or if the condition fails to improve as anticipated.  25 minutes of non-face-to-face time  was provided during this encounter.                      Historical information moved to improve visibility of documentation.  Past Medical History:  Diagnosis Date  . Anemia   . Arthritis   . 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 Chronic kidney disease    stage 3  . Coronary artery disease   . Depression   . Diabetes mellitus without complication (HCC)    Type 1  . Diabetic retinopathy ([place])   . Family history of adverse reaction to anesthesia    mother: " while she was under she stopped  breathing."  . Fibromyalgia   . Gastroparesis   . GERD (gastroesophageal reflux disease)   . Headache    migraines  . Hypothyroidism   . IBS (irritable bowel syndrome)   . Idiopathic edema   . Lyme disease   . Mitral valve prolapse   . Myocardial infarction ([place])    1 major in 1999 and 2 minor " small vessel disease."  . Osteoporosis   . Peripheral neuropathy   . Peripheral vascular disease ([place])   . 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 ABDOMINAL HYSTERECTOMY    . APPENDECTOMY    . BREAST SURGERY     B/L biopsy and lumpectomy   . CARDIAC CATHETERIZATION    . CARPAL TUNNEL RELEASE    . CATARACT EXTRACTION W/ INTRAOCULAR LENS IMPLANT     right eye  . COLONOSCOPY W/ BIOPSIES AND POLYPECTOMY    . CORONARY ARTERY BYPASS GRAFT    . coronary artery stents     at LAD and LIMA  . ECTOPIC PREGNANCY SURGERY    . NASAL SEPTUM SURGERY    . OPEN REDUCTION INTERNAL FIXATION (ORIF) DISTAL RADIAL FRACTURE Right 05/06/2015   Procedure: OPEN REDUCTION INTERNAL FIXATION (ORIF) RIGHT DISTAL RADIAL FRACTURE AND REPAIRS AS NEEDED;  Surgeon: Iran Planas, MD;  Location: [place];  Service: Orthopedics;  Laterality: Right;  . ORIF WRIST FRACTURE Left 11/05/2014  . ORIF WRIST FRACTURE Left 11/05/2014   Procedure: OPEN REDUCTION INTERNAL FIXATION (ORIF) LEFT WRIST FRACTURE AND REPAIR AS INDICATED;  Surgeon: Iran Planas, MD;  Location: [place];  Service: Orthopedics;  Laterality: Left;  . TRIGGER FINGER RELEASE     Social History   Tobacco Use  . Smoking status: Former Smoker    Types: Cigarettes  . Smokeless tobacco: Never Used  . Tobacco comment:  " Quit smoking cigarettes in 20's "  Substance Use Topics  . Alcohol use: Yes    Comment: occasional beer or wine   family history includes Breast cancer in her mother and sister; Heart  disease in her father; Hypertension in her father.  Medications: Current Outpatient Medications  Medication Sig Dispense Refill  . acyclovir (ZOVIRAX) 400 MG tablet Take 1 tablet (400 mg total) by mouth 2 (two) times daily. 180 tablet 3  . Alpha-Lipoic Acid 600 MG CAPS Take 600 mg by mouth 2 (two) times daily.     Marland Kitchen arginine 500 MG tablet Take 500 mg by mouth daily.    . Armodafinil 250 MG tablet Take 0.5 tablets (125 mg total) by mouth daily. 45 tablet 1  . aspirin 325  MG EC tablet Take 325 mg by mouth daily.    . Aspirin-Calcium Carbonate 81-777 MG TABS Take by mouth.    . carvedilol (COREG) 3.125 MG tablet Take 1 tablet (3.125 mg total) by mouth 2 (two) times daily with a meal. 60 tablet 0  . cetirizine (ZYRTEC) 10 MG tablet Take 10 mg by mouth at bedtime.    . Cholecalciferol (VITAMIN D3) 5000 UNITS TABS Take 1,000 Units by mouth daily.     . cyclobenzaprine (FLEXERIL) 10 MG tablet Take 0.5-1 tablets (5-10 mg total) by mouth 3 (three) times daily as needed for muscle spasms. Caution: can cause drowsiness 60 tablet 1  . ELDERBERRY PO Take by mouth.    . Esomeprazole Magnesium (NEXIUM PO) Take 20 mg by mouth every morning.     . fluocinonide (LIDEX) 0.05 % external solution APPLY SOLUTION TOPICALLY ONCE DAILY AS NEEDED TO SCALP    . folic acid (FOLVITE) 1 MG tablet Take by mouth.    . Insulin Human (INSULIN PUMP) SOLN Inject into the skin continuous. Novolog insulin    . Linoleic Acid Conjugated 1000 MG CAPS Take 1,000 mg by mouth daily.     Marland Kitchen MELATONIN PO Take by mouth.    . Methylsulfonylmethane (MSM) 1000 MG TABS Take by mouth. 1-4 times a day    . NATURE-THROID 65 MG tablet     . nitroGLYCERIN (NITROLINGUAL) 0.4 MG/SPRAY spray Place 1 spray under the tongue every 5 (five) minutes x 3 doses as needed for chest pain. 12 g 1  . NON FORMULARY Chloroella    . OVER THE COUNTER MEDICATION Take 750 mg by mouth at bedtime. GABA    . OVER THE COUNTER MEDICATION as directed. Magnesium  L-Treonate    . OVER THE COUNTER MEDICATION as directed. Omega EPA/DHA    . OVER THE COUNTER MEDICATION as directed. Grape fruit seed extract    . potassium chloride (KLOR-CON M10) 10 MEQ tablet Take 1 tablet (10 mEq total) by mouth 2 (two) times daily. No more refills until appt is scheduled 180 tablet 0  . SYNTHROID 25 MCG tablet TAKE 1 TABLET DAILY BEFORE BREAKFAST. 90 tablet 0  . torsemide ([name]) 20 MG tablet TAKE 1 TABLET ONCE DAILY INTHE MORNING. MAY TAKE AN   EXTRA 1/2 TABLET AS        NEEDED 135 tablet 0   No current facility-administered medications for this visit.    Allergies  Allergen Reactions  . Dilaudid [Hydromorphone] Other (See Comments)    Because of continuous glucose monitor  . Morphine Anaphylaxis and Shortness Of Breath    Other reaction(s): Respiratory Distress (ALLERGY/intolerance)  . Morphine And Related Shortness Of Breath  . Penicillins Hives    Has patient had a PCN reaction causing immediate rash, facial/tongue/throat swelling, SOB or lightheadedness with hypotension: Yes Has patient had a PCN reaction causing severe rash involving mucus membranes or skin necrosis: No Has patient had a PCN reaction that required hospitalization No Has patient had a PCN reaction occurring within the last 10 years: No If all of the above answers are "NO", then may proceed with Cephalosporin use.  . Tramadol Anaphylaxis  . Tramadol-Acetaminophen Other (See Comments)    Small vessel heart attack Other reaction(s): Cardiovascular Arrest (ALLERGY/intolerance), Other (See Comments)   . Acetaminophen Other (See Comments)    Alters insulin pump readings   . Amitriptyline Other (See Comments)    Severe headache/ out of body feeling  . Codeine Other (See Comments)  Severe headaches/ out of body feeling   . Gluten Meal Diarrhea and Nausea And Vomiting    Cramping   . Ibuprofen Other (See Comments)    Messes up CGM reading on glucose monitor Other reaction(s): Other, Other  (See Comments)   . Other Swelling and Other (See Comments)    Cats itching Mold sinuses Cat hair and scratches cause swelling    . Propoxyphene Other (See Comments)    Severe headaches / out of body feeling   . Statins Other (See Comments)    Muscle pains Other reaction(s): Other  . Sulfamethoxazole Other (See Comments)    Mouth ulcers   . Wheat Bran Diarrhea and Nausea And Vomiting    Cramping   . Ciprofloxacin   . Levofloxacin   . Pravastatin Sodium   . Rosuvastatin Calcium   . Shellfish Allergy Diarrhea  . Simvastatin   . Sulfa Antibiotics Other (See Comments)    Mouth ulcers  . Sulfites Other (See Comments)    Mouth ulcers  . Trichophyton Itching and Other (See Comments)    sinusitis     PDMP not reviewed this encounter. Orders Placed This Encounter  Procedures  . CBC with Differential/Platelet  . COMPLETE METABOLIC PANEL WITH GFR  . TSH  . Magnesium  . Urinalysis, Routine w reflex microscopic  . Vitamin B12  . Folate RBC  . VITAMIN D 25 Hydroxy (Vit-D Deficiency, Fractures)   No orders of the defined types were placed in this encounter.

## 2018-07-18 ENCOUNTER — Encounter: Payer: Self-pay | Admitting: Osteopathic Medicine

## 2018-07-19 ENCOUNTER — Telehealth: Payer: Self-pay

## 2018-07-19 NOTE — Telephone Encounter (Signed)
At pt's request - lab order faxed to [facility] [phone]) located in [place] on [address]. As per pt, "her brain is not working". She's having a hard time gathering her thoughts. She is pretty sure she had 2 mini strokes this weekend because she is having residual effects. She has contacted her Neurologist and kidney specialist for add'l recommendations.

## 2018-07-19 NOTE — Telephone Encounter (Signed)
Noted, we discussed these concerns at her visit

## 2018-07-21 LAB — LIPID PANEL
Cholesterol: 204 mg/dL — ABNORMAL HIGH (ref <200)
HDL: 62 mg/dL (ref >=50)
LDL Cholesterol (Calc): 116 mg/dL (calc) — ABNORMAL HIGH
Non-HDL Cholesterol (Calc): 142 mg/dL (calc) — ABNORMAL HIGH (ref <130)
Total CHOL/HDL Ratio: 3.3 (calc) (ref <5.0)
Triglycerides: 138 mg/dL (ref <150)

## 2018-07-21 LAB — HEPATIC FUNCTION PANEL
AG Ratio: 1.8 (calc) (ref 1.0–2.5)
ALT: 14 U/L (ref 6–29)
AST: 21 U/L (ref 10–35)
Albumin: 4.2 g/dL (ref 3.6–5.1)
Alkaline phosphatase (APISO): 84 U/L (ref 37–153)
Bilirubin, Direct: 0.1 mg/dL (ref 0.0–0.2)
Globulin: 2.4 g/dL (calc) (ref 1.9–3.7)
Indirect Bilirubin: 0.3 mg/dL (calc) (ref 0.2–1.2)
Total Bilirubin: 0.4 mg/dL (ref 0.2–1.2)
Total Protein: 6.6 g/dL (ref 6.1–8.1)

## 2018-07-23 ENCOUNTER — Other Ambulatory Visit: Payer: Self-pay | Admitting: *Deleted

## 2018-07-23 ENCOUNTER — Encounter: Payer: Self-pay | Admitting: Osteopathic Medicine

## 2018-07-23 DIAGNOSIS — E7800 Pure hypercholesterolemia, unspecified: Secondary | ICD-10-CM

## 2018-07-23 LAB — CBC WITH DIFFERENTIAL/PLATELET
Absolute Monocytes: 586 cells/uL (ref 200–950)
Basophils Absolute: 122 cells/uL (ref 0–200)
Basophils Relative: 2 %
Eosinophils Absolute: 329 cells/uL (ref 15–500)
Eosinophils Relative: 5.4 %
HCT: 38.7 % (ref 35.0–45.0)
Hemoglobin: 12.3 g/dL (ref 11.7–15.5)
Lymphs Abs: 2153 cells/uL (ref 850–3900)
MCH: 28.2 pg (ref 27.0–33.0)
MCHC: 31.8 g/dL — ABNORMAL LOW (ref 32.0–36.0)
MCV: 88.8 fL (ref 80.0–100.0)
MPV: 9.4 fL (ref 7.5–12.5)
Monocytes Relative: 9.6 %
Neutro Abs: 2910 cells/uL (ref 1500–7800)
Neutrophils Relative %: 47.7 %
Platelets: 448 10*3/uL — ABNORMAL HIGH (ref 140–400)
RBC: 4.36 10*6/uL (ref 3.80–5.10)
RDW: 14.2 % (ref 11.0–15.0)
Total Lymphocyte: 35.3 %
WBC: 6.1 10*3/uL (ref 3.8–10.8)

## 2018-07-23 LAB — URINALYSIS, ROUTINE W REFLEX MICROSCOPIC
Bacteria, UA: NONE SEEN /HPF
Bilirubin Urine: NEGATIVE
Glucose, UA: NEGATIVE
Hgb urine dipstick: NEGATIVE
Hyaline Cast: NONE SEEN /LPF
Ketones, ur: NEGATIVE
Leukocytes,Ua: NEGATIVE
Nitrite: NEGATIVE
RBC / HPF: NONE SEEN /HPF (ref 0–2)
Specific Gravity, Urine: 1.01 (ref 1.001–1.03)
Squamous Epithelial / LPF: NONE SEEN /HPF (ref <=5)
WBC, UA: NONE SEEN /HPF (ref 0–5)
pH: 5.5 (ref 5.0–8.0)

## 2018-07-23 LAB — TSH: TSH: 2.28 mIU/L (ref 0.40–4.50)

## 2018-07-23 LAB — COMPLETE METABOLIC PANEL WITH GFR
AG Ratio: 1.8 (calc) (ref 1.0–2.5)
ALT: 14 U/L (ref 6–29)
AST: 21 U/L (ref 10–35)
Albumin: 4.2 g/dL (ref 3.6–5.1)
Alkaline phosphatase (APISO): 85 U/L (ref 37–153)
BUN: 21 mg/dL (ref 7–25)
CO2: 28 mmol/L (ref 20–32)
Calcium: 9.9 mg/dL (ref 8.6–10.4)
Chloride: 99 mmol/L (ref 98–110)
Creat: 0.99 mg/dL (ref 0.50–0.99)
GFR, Est African American: 70 mL/min/{1.73_m2} (ref >=60)
GFR, Est Non African American: 60 mL/min/{1.73_m2} (ref >=60)
Globulin: 2.4 g/dL (calc) (ref 1.9–3.7)
Glucose, Bld: 93 mg/dL (ref 65–99)
Potassium: 4.9 mmol/L (ref 3.5–5.3)
Sodium: 136 mmol/L (ref 135–146)
Total Bilirubin: 0.4 mg/dL (ref 0.2–1.2)
Total Protein: 6.6 g/dL (ref 6.1–8.1)

## 2018-07-23 LAB — MAGNESIUM: Magnesium: 1.8 mg/dL (ref 1.5–2.5)

## 2018-07-23 LAB — VITAMIN D 25 HYDROXY (VIT D DEFICIENCY, FRACTURES): Vit D, 25-Hydroxy: 59 ng/mL (ref 30–100)

## 2018-07-23 LAB — VITAMIN B12: Vitamin B-12: 588 pg/mL (ref 200–1100)

## 2018-07-23 LAB — FOLATE RBC: RBC Folate: >1000 ng/mL RBC (ref >280)

## 2018-07-23 NOTE — Progress Notes (Signed)
amb ref to  

## 2018-07-24 ENCOUNTER — Encounter: Payer: Self-pay | Admitting: Osteopathic Medicine

## 2018-07-30 ENCOUNTER — Encounter: Payer: Self-pay | Admitting: Osteopathic Medicine

## 2018-07-31 NOTE — Telephone Encounter (Signed)
FYI if this patient has more questions she should schedule a 40-minute virtual visit to address her concerns. Thanks!

## 2018-09-01 ENCOUNTER — Other Ambulatory Visit: Payer: Self-pay | Admitting: Osteopathic Medicine

## 2018-09-13 ENCOUNTER — Telehealth: Payer: Self-pay | Admitting: Cardiology

## 2018-09-13 NOTE — Telephone Encounter (Signed)
Patient is having a lot of swelling and would like to be seen sooner than October, please advise

## 2018-09-13 NOTE — Telephone Encounter (Signed)
Spoke with pt, for the last 3-4 weeks she has had increased swelling in her feet by the end of the day. She had a virtual visit with her nephrologist and they stopped her torsemide. She has noticed this swelling since then. Her weight is up 3 to 5 lbs, she denies SOB or orthopnea. She reports lightheadedness related to lower bp readings but last night her bp was 163/69. Patient advised to restart the torsemide, she will take 1 and 1/2 tablets now, then 1 tablet daily. She does have a call into the nephrologist but has not heard back. Virtual follow up visit made.

## 2018-09-14 ENCOUNTER — Other Ambulatory Visit: Payer: Self-pay

## 2018-09-14 ENCOUNTER — Other Ambulatory Visit: Payer: Self-pay | Admitting: Osteopathic Medicine

## 2018-09-14 DIAGNOSIS — I2510 Atherosclerotic heart disease of native coronary artery without angina pectoris: Secondary | ICD-10-CM

## 2018-09-14 DIAGNOSIS — I2583 Coronary atherosclerosis due to lipid rich plaque: Secondary | ICD-10-CM

## 2018-09-14 DIAGNOSIS — I5032 Chronic diastolic (congestive) heart failure: Secondary | ICD-10-CM

## 2018-09-14 MED ORDER — ARMODAFINIL 250 MG PO TABS: 125.0000 mg | ORAL_TABLET | Freq: Every day | ORAL | 1 refills | Status: DC

## 2018-09-14 MED ORDER — POTASSIUM CHLORIDE CRYS ER 10 MEQ PO TBCR: 10.0000 meq | EXTENDED_RELEASE_TABLET | Freq: Two times a day (BID) | ORAL | 1 refills | Status: DC

## 2018-09-14 MED ORDER — NATURE-THROID 65 MG PO TABS: 65.0000 mg | ORAL_TABLET | Freq: Every day | ORAL | 1 refills | Status: DC

## 2018-09-14 NOTE — Telephone Encounter (Signed)
Venia called for a refill on Nature-Throid and Armodafinil. They need to go to different pharmacies. They have been pended to the correct pharmacy.

## 2018-09-14 NOTE — Telephone Encounter (Signed)
This refill cannot be delegated  Last ov was 07/17/2018  Requested Prescriptions  Pending Prescriptions Disp Refills  . Armodafinil 250 MG tablet [Pharmacy Med Name: Armodafinil 250 MG Oral Tablet] 30 tablet 0    Sig: TAKE 1/2 TO 1 (ONE-HALF TO ONE) TABLET BY MOUTH ONCE DAILY IN THE MORNING     Not Delegated - Psychiatry:  Stimulants/ADHD Failed - 09/14/2018 10:19 AM      Failed - This refill cannot be delegated      Failed - Urine Drug Screen completed in last 360 days.      Failed - Valid encounter within last 3 months    Recent Outpatient Visits          1 month ago Chronic fatigue   [facility], [place], DO   7 months ago Chronic fatigue   [facility], Lanelle Bal, DO   1 year ago Chronic fatigue   [facility], Lanelle Bal, DO   1 year ago Urinary frequency   [place] [facility], Lanelle Bal, DO   1 year ago Chronic neck pain    [facility], Rebekah [place], MD

## 2018-09-19 ENCOUNTER — Telehealth: Payer: Medicare Other | Admitting: Internal Medicine

## 2018-09-25 ENCOUNTER — Telehealth: Payer: Self-pay

## 2018-09-25 DIAGNOSIS — E039 Hypothyroidism, unspecified: Secondary | ICD-10-CM

## 2018-09-25 NOTE — Telephone Encounter (Addendum)
Ronalee Belts from [facility] left a vm msg. Per pharmacy - armodafinil has been recalled - every strength/mfg. Requesting to switch to NatureThroid. Pls advise, thanks.

## 2018-09-26 NOTE — Progress Notes (Signed)
Virtual Visit via Video Note changed to phone visit due to technical difficulties   This visit type was conducted due to national recommendations for restrictions regarding the COVID-19 Pandemic (e.g. social distancing) in an effort to limit this patient's exposure and mitigate transmission in our community.  Due to her co-morbid illnesses, this patient is at least at moderate risk for complications without adequate follow up.  This format is felt to be most appropriate for this patient at this time.  All issues noted in this document were discussed and addressed.  A limited physical exam was performed with this format.  Please refer to the patient's chart for her consent to telehealth for [facility].   Date:  09/27/2018   ID:  Brittney Pace, DOB Apr 24, 1955, MRN [id number]  Patient Location:Home Provider Location: Home  PCP:  Emeterio Reeve, DO  Cardiologist:  Dr Stanford Breed  Evaluation Performed:  Follow-Up Visit  Chief Complaint:  FU CAD  History of Present Illness:    FU CAD. Patient previously followed at [place]; H/O CABG (LIMA to LAD; SVG to RCA); According to the patient, she had 2 stents to the LIMA graft and the LAD in 2000. She's had EECP treatments in [place]. Her last heart catheterization was August 2015 at [facility] which revealed two-vessel coronary disease with a patent LIMA to the LAD and SVG to the RCA. No obstructive disease in left circumflex; EF 65.  Nuclear study ordered June 2019 but not performed.  Last echocardiogram January 2020 showed normal LV function, systolic bowing of mitral valve without frank prolapse.  Also with history of stroke.  Since last seen, she has dyspnea with moderate activities; no orthopnea; minimal pedal edema; rare CP resolved with SLNTG. No syncope.  The patient does not have symptoms concerning for COVID-19   infection (fever, chills, cough, or new shortness of breath).    Past Medical History:   Diagnosis Date  . Anemia   . Arthritis   . 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 Chronic kidney disease    stage 3  . Coronary artery disease   . Depression   . Diabetes mellitus without complication (HCC)    Type 1  . Diabetic retinopathy ([place])   . Family history of adverse reaction to anesthesia    mother: " while she was under she stopped breathing."  . Fibromyalgia   . Gastroparesis   . GERD (gastroesophageal reflux disease)   . Headache    migraines  . Hypothyroidism   . IBS (irritable bowel syndrome)   . Idiopathic edema   . Lyme disease   . Mitral valve prolapse   . Myocardial infarction ([place])    1 major in 1999 and 2 minor " small vessel disease."  . Osteoporosis   . Peripheral neuropathy   . Peripheral vascular disease ([place])   . 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 ABDOMINAL HYSTERECTOMY    . APPENDECTOMY    . BREAST SURGERY     B/L biopsy and lumpectomy   . CARDIAC CATHETERIZATION    . CARPAL TUNNEL RELEASE    . CATARACT EXTRACTION W/ INTRAOCULAR LENS IMPLANT     right eye  . COLONOSCOPY W/ BIOPSIES AND POLYPECTOMY    .  CORONARY ARTERY BYPASS GRAFT    . coronary artery stents     at LAD and LIMA  . ECTOPIC PREGNANCY SURGERY    . NASAL SEPTUM SURGERY    . OPEN REDUCTION INTERNAL FIXATION (ORIF) DISTAL RADIAL FRACTURE Right 05/06/2015   Procedure: OPEN REDUCTION INTERNAL FIXATION (ORIF) RIGHT DISTAL RADIAL FRACTURE AND REPAIRS AS NEEDED;  Surgeon: Iran Planas, MD;  Location: [place];  Service: Orthopedics;  Laterality: Right;  . ORIF WRIST FRACTURE Left 11/05/2014  . ORIF WRIST FRACTURE Left 11/05/2014   Procedure: OPEN REDUCTION INTERNAL FIXATION (ORIF) LEFT WRIST FRACTURE AND REPAIR AS INDICATED;  Surgeon: Iran Planas, MD;  Location: [place];  Service: Orthopedics;  Laterality: Left;   . TRIGGER FINGER RELEASE       Current Meds  Medication Sig  . arginine 500 MG tablet Take 500 mg by mouth as directed.   . Armodafinil 250 MG tablet Take 0.5 tablets (125 mg total) by mouth daily.  Marland Kitchen aspirin 325 MG EC tablet Take 325 mg by mouth daily.  . carvedilol (COREG) 3.125 MG tablet TAKE 1 TABLET TWICE DAILY  WITH MEALS.  Marland Kitchen Cholecalciferol (VITAMIN D3) 5000 UNITS TABS Take 1,000 Units by mouth daily.   . cyclobenzaprine (FLEXERIL) 10 MG tablet Take 0.5-1 tablets (5-10 mg total) by mouth 3 (three) times daily as needed for muscle spasms. Caution: can cause drowsiness  . ELDERBERRY PO Take by mouth.  . Esomeprazole Magnesium (NEXIUM PO) Take 20 mg by mouth every morning.   . fluocinonide (LIDEX) 0.05 % external solution APPLY SOLUTION TOPICALLY ONCE DAILY AS NEEDED TO SCALP  . folic acid (FOLVITE) 1 MG tablet Take by mouth.  . Insulin Human (INSULIN PUMP) SOLN Inject into the skin continuous. Novolog insulin  . Linoleic Acid Conjugated 1000 MG CAPS Take 1,000 mg by mouth daily.   Marland Kitchen MELATONIN PO Take by mouth.  Marland Kitchen NATURE-THROID 65 MG tablet Take 1 tablet (65 mg total) by mouth daily.  . nitroGLYCERIN (NITROLINGUAL) 0.4 MG/SPRAY spray Place 1 spray under the tongue every 5 (five) minutes x 3 doses as needed for chest pain.  . NON FORMULARY Chloroella  . OVER THE COUNTER MEDICATION Take 750 mg by mouth at bedtime. GABA  . OVER THE COUNTER MEDICATION as directed. Magnesium L-Treonate  . OVER THE COUNTER MEDICATION as directed. Omega EPA/DHA  . OVER THE COUNTER MEDICATION as directed. Grape fruit seed extract  . potassium chloride (KLOR-CON M10) 10 MEQ tablet Take 1 tablet (10 mEq total) by mouth 2 (two) times daily. No more refills until appt is scheduled  . torsemide ([name]) 20 MG tablet TAKE 1 TABLET ONCE DAILY INTHE MORNING. MAY TAKE AN   EXTRA 1/2 TABLET AS        NEEDED     Allergies:   Dilaudid [hydromorphone], Morphine, Morphine and related, Penicillins, Tramadol,  Tramadol-acetaminophen, Acetaminophen, Amitriptyline, Codeine, Gluten meal, Ibuprofen, Other, Propoxyphene, Statins, Sulfamethoxazole, Wheat bran, Ciprofloxacin, Levofloxacin, Pravastatin sodium, Rosuvastatin calcium, Shellfish allergy, Simvastatin, Sulfa antibiotics, Sulfites, and Trichophyton   Social History   Tobacco Use  . Smoking status: Former Smoker    Types: Cigarettes  . Smokeless tobacco: Never Used  . Tobacco comment:  " Quit smoking cigarettes in 20's "  Substance Use Topics  . Alcohol use: Yes    Comment: occasional beer or wine  . Drug use: No     Family Hx: The patient's family history includes Breast cancer in her mother and sister; Heart disease in her father; Hypertension in her  father.  ROS:   Please see the history of present illness.    No Fever, chills  or productive cough; problems with low sugar; fibomyalgia. All other systems reviewed and are negative  Recent Labs: 07/20/2018: ALT 14; BUN 21; Creat 0.99; Hemoglobin 12.3; Magnesium 1.8; Platelets 448; Potassium 4.9; Sodium 136; TSH 2.28   Recent Lipid Panel Lab Results  Component Value Date/Time   CHOL 204 (H) 07/20/2018 08:23 AM   TRIG 138 07/20/2018 08:23 AM   HDL 62 07/20/2018 08:23 AM   CHOLHDL 3.3 07/20/2018 08:23 AM   LDLCALC 116 (H) 07/20/2018 08:23 AM    Wt Readings from Last 3 Encounters:  09/27/18 94 lb (42.6 kg)  07/17/18 95 lb (43.1 kg)  02/14/18 98 lb 12.8 oz (44.8 kg)     Objective:    Vital Signs:  BP 130/81   Pulse 71   Ht 4' 11.75" (1.518 m)   Wt 94 lb (42.6 kg)   BMI 18.51 kg/m    VITAL SIGNS:  reviewed NAD Answers questions appropriately Normal affect Remainder of physical examination not performed (telehealth visit; coronavirus pandemic)  ASSESSMENT & PLAN:    1. Coronary artery disease-patient is not having chest pain at present.  Continue medical therapy with aspirin.  Intolerant to statins.  She is following up with her cardiologist in [place] for EECP. 2.  Hyperlipidemia-intolerant to statins and Zetia.  Refer to lipid clinic for [place]. 3. Diastolic congestive heart failure-chronic-continue present dose of diuretic.  Euvolemic.  Continue fluid restriction and low-sodium diet. 4. Diabetes mellitus-managed by primary care.  COVID-19 Education: The importance of social distancing was discussed today.  Time:   Today, I have spent 25 minutes with the patient with telehealth technology discussing the above problems.     Medication Adjustments/Labs and Tests Ordered: Current medicines are reviewed at length with the patient today.  Concerns regarding medicines are outlined above.   Tests Ordered: No orders of the defined types were placed in this encounter.   Medication Changes: No orders of the defined types were placed in this encounter.   Follow Up:  Virtual Visit or In Person in 6 month(s)  Signed, Kirk Ruths, MD  09/27/2018 10:04 AM    [place]

## 2018-09-26 NOTE — Telephone Encounter (Signed)
Not sure what they're talking about. Armodafinil is a stimulant for narcolepsy. Not sue what they mean about switching to thyroid medicine? Can we call to confirm that they just need an alternative to armodafinil?

## 2018-09-26 NOTE — Telephone Encounter (Signed)
The pharmacy states the NatureThyroid has been recalled due to low testing. Pharmacist recommends switching to Armour Thyroid 60.

## 2018-09-27 ENCOUNTER — Telehealth (INDEPENDENT_AMBULATORY_CARE_PROVIDER_SITE_OTHER): Payer: 59 | Admitting: Cardiology

## 2018-09-27 ENCOUNTER — Encounter: Payer: Self-pay | Admitting: Cardiology

## 2018-09-27 VITALS — BP 130/81 | HR 71 | Ht 59.75 in | Wt 94.0 lb

## 2018-09-27 DIAGNOSIS — E7800 Pure hypercholesterolemia, unspecified: Secondary | ICD-10-CM

## 2018-09-27 DIAGNOSIS — I2583 Coronary atherosclerosis due to lipid rich plaque: Secondary | ICD-10-CM

## 2018-09-27 DIAGNOSIS — I5032 Chronic diastolic (congestive) heart failure: Secondary | ICD-10-CM

## 2018-09-27 DIAGNOSIS — I2510 Atherosclerotic heart disease of native coronary artery without angina pectoris: Secondary | ICD-10-CM | POA: Diagnosis not present

## 2018-09-27 MED ORDER — THYROID 60 MG PO TABS: 60.0000 mg | ORAL_TABLET | Freq: Every day | ORAL | 0 refills | Status: DC

## 2018-09-27 NOTE — Telephone Encounter (Signed)
Patient advised.

## 2018-09-27 NOTE — Telephone Encounter (Signed)
OK sent Armour, thank pharmacist for the substitution suggestion.   Pt will need TSH labs done in the next 2-3 months, orders are in

## 2018-09-27 NOTE — Patient Instructions (Signed)
Medication Instructions:  NO CHANGE If you need a refill on your cardiac medications before your next appointment, please call your pharmacy.   Lab work: If you have labs (blood work) drawn today and your tests are completely normal, you will receive your results only by: Marland Kitchen MyChart Message (if you have MyChart) OR . A paper copy in the mail If you have any lab test that is abnormal or we need to change your treatment, we will call you to review the results.  Follow-Up: At [facility], you and your health needs are our priority.  As part of our continuing mission to provide you with exceptional heart care, we have created designated Provider Care Teams.  These Care Teams include your primary Cardiologist (physician) and Advanced Practice Providers (APPs -  Physician Assistants and Nurse Practitioners) who all work together to provide you with the care you need, when you need it. You will need a follow up appointment in 6 months.  Please call our office 2 months in advance to schedule this appointment.  You may see Kirk Ruths MD or one of the following Advanced Practice Providers on your designated Care Team:   Kerin Ransom, PA-C Roby Lofts, [place] . Sande Rives, PA-C  REFERRAL TO LIPID CLINIC

## 2018-10-03 ENCOUNTER — Ambulatory Visit (INDEPENDENT_AMBULATORY_CARE_PROVIDER_SITE_OTHER): Payer: 59

## 2018-10-03 ENCOUNTER — Other Ambulatory Visit: Payer: Self-pay

## 2018-10-03 ENCOUNTER — Ambulatory Visit (INDEPENDENT_AMBULATORY_CARE_PROVIDER_SITE_OTHER): Payer: 59 | Admitting: Osteopathic Medicine

## 2018-10-03 ENCOUNTER — Telehealth: Payer: Self-pay | Admitting: Hematology & Oncology

## 2018-10-03 ENCOUNTER — Encounter: Payer: Self-pay | Admitting: Osteopathic Medicine

## 2018-10-03 VITALS — BP 176/77 | HR 65 | Temp 98.1°F | Wt 95.3 lb

## 2018-10-03 DIAGNOSIS — M79672 Pain in left foot: Secondary | ICD-10-CM

## 2018-10-03 DIAGNOSIS — F329 Major depressive disorder, single episode, unspecified: Secondary | ICD-10-CM

## 2018-10-03 DIAGNOSIS — I2583 Coronary atherosclerosis due to lipid rich plaque: Secondary | ICD-10-CM

## 2018-10-03 DIAGNOSIS — I2510 Atherosclerotic heart disease of native coronary artery without angina pectoris: Secondary | ICD-10-CM

## 2018-10-03 DIAGNOSIS — D638 Anemia in other chronic diseases classified elsewhere: Secondary | ICD-10-CM | POA: Diagnosis not present

## 2018-10-03 DIAGNOSIS — F419 Anxiety disorder, unspecified: Secondary | ICD-10-CM

## 2018-10-03 DIAGNOSIS — F32A Depression, unspecified: Secondary | ICD-10-CM

## 2018-10-03 NOTE — Progress Notes (Signed)
HPI: Brittney Pace is a 62 y.o. female who  has a past medical history of Anemia, Arthritis, Babesiasis, Chronic kidney disease, Coronary artery disease, Depression, Diabetes mellitus without complication ([place]), Diabetic retinopathy ([place]), Family history of adverse reaction to anesthesia, Fibromyalgia, Gastroparesis, GERD (gastroesophageal reflux disease), Headache, Hypothyroidism, IBS (irritable bowel syndrome), Idiopathic edema, Lyme disease, Mitral valve prolapse, Myocardial infarction ([place]), Osteoporosis, Peripheral neuropathy, Peripheral vascular disease ([place]), Sinus disorder, and Stroke ([place]).  she presents to [facility] today, 10/03/18,  for chief complaint of:  Foot pain  Concern for plantar wart on bottom of L foot, pain w/ walking barefoot on hard surfaces.   Requests second opinion from hematologist. History of anemia chronic disease occasionally needing EPO. Recent visit Hem at [facility] left a bad taste.   Requests referral for phsych/beh health for counseling, possibly would like to discuss psychiatry in the future for medication management      At today's visit 10/03/18 ... PMH, PSH, FH reviewed and updated as needed.  Current medication list and allergy/intolerance hx reviewed and updated as needed. (See remainder of HPI, ROS, Phys Exam below)   No results found.  No results found for this or any previous visit (from the past 72 hour(s)).        ASSESSMENT/PLAN: The primary encounter diagnosis was Left foot pain. Diagnoses of Anxiety and depression and Anemia, chronic disease were also pertinent to this visit.   No plantar wart. Possible neuroma vs plantar fasciitis. Pt has orthotics. Consider Podiatry referral.   Referrals as below   Orders Placed This Encounter  Procedures  . DG Foot Complete Left  . Ambulatory referral to [facility]  . Ambulatory referral to Hematology        Follow-up plan: Return if  symptoms worsen or fail to improve.                                                 ################################################# ################################################# ################################################# #################################################    Current Meds  Medication Sig  . arginine 500 MG tablet Take 500 mg by mouth as directed.   . Armodafinil 250 MG tablet Take 0.5 tablets (125 mg total) by mouth daily.  Marland Kitchen aspirin 325 MG EC tablet Take 325 mg by mouth daily.  . carvedilol (COREG) 3.125 MG tablet TAKE 1 TABLET TWICE DAILY  WITH MEALS.  Marland Kitchen Cholecalciferol (VITAMIN D3) 5000 UNITS TABS Take 1,000 Units by mouth daily.   . cyclobenzaprine (FLEXERIL) 10 MG tablet Take 0.5-1 tablets (5-10 mg total) by mouth 3 (three) times daily as needed for muscle spasms. Caution: can cause drowsiness  . diphenhydrAMINE HCl (BENADRYL ALLERGY PO) Take by mouth.  . ELDERBERRY PO Take by mouth.  . Esomeprazole Magnesium (NEXIUM PO) Take 20 mg by mouth every morning.   . fluocinonide (LIDEX) 0.05 % external solution APPLY SOLUTION TOPICALLY ONCE DAILY AS NEEDED TO SCALP  . folic acid (FOLVITE) 1 MG tablet Take by mouth.  . Insulin Human (INSULIN PUMP) SOLN Inject into the skin continuous. Novolog insulin  . KRILL OIL PO Take by mouth.  . Linoleic Acid Conjugated 1000 MG CAPS Take 1,000 mg by mouth daily.   Marland Kitchen MELATONIN PO Take by mouth.  . nitroGLYCERIN (NITROLINGUAL) 0.4 MG/SPRAY spray Place 1 spray under the tongue every 5 (five) minutes x 3 doses as needed for chest pain.  Marland Kitchen  NON FORMULARY Chloroella  . OVER THE COUNTER MEDICATION Take 750 mg by mouth at bedtime. GABA  . OVER THE COUNTER MEDICATION as directed. Magnesium L-Treonate  . OVER THE COUNTER MEDICATION as directed. Grape fruit seed extract  . potassium chloride (KLOR-CON M10) 10 MEQ tablet Take 1 tablet (10 mEq total) by mouth 2 (two) times daily. No more  refills until appt is scheduled  . thyroid (ARMOUR THYROID) 60 MG tablet Take 1 tablet (60 mg total) by mouth daily before breakfast.  . torsemide ([name]) 20 MG tablet TAKE 1 TABLET ONCE DAILY INTHE MORNING. MAY TAKE AN   EXTRA 1/2 TABLET AS        NEEDED    Allergies  Allergen Reactions  . Dilaudid [Hydromorphone] Other (See Comments)    Because of continuous glucose monitor  . Morphine Anaphylaxis and Shortness Of Breath    Other reaction(s): Respiratory Distress (ALLERGY/intolerance)  . Morphine And Related Shortness Of Breath  . Penicillins Hives    Has patient had a PCN reaction causing immediate rash, facial/tongue/throat swelling, SOB or lightheadedness with hypotension: Yes Has patient had a PCN reaction causing severe rash involving mucus membranes or skin necrosis: No Has patient had a PCN reaction that required hospitalization No Has patient had a PCN reaction occurring within the last 10 years: No If all of the above answers are "NO", then may proceed with Cephalosporin use.  . Tramadol Anaphylaxis  . Tramadol-Acetaminophen Other (See Comments)    Small vessel heart attack Other reaction(s): Cardiovascular Arrest (ALLERGY/intolerance), Other (See Comments)   . Acetaminophen Other (See Comments)    Alters insulin pump readings   . Amitriptyline Other (See Comments)    Severe headache/ out of body feeling  . Codeine Other (See Comments)    Severe headaches/ out of body feeling   . Gluten Meal Diarrhea and Nausea And Vomiting    Cramping   . Ibuprofen Other (See Comments)    Messes up CGM reading on glucose monitor Other reaction(s): Other, Other (See Comments)   . Other Swelling and Other (See Comments)    Cats itching Mold sinuses Cat hair and scratches cause swelling    . Propoxyphene Other (See Comments)    Severe headaches / out of body feeling   . Shellfish Allergy Diarrhea and Nausea And Vomiting  . Statins Other (See Comments)    Muscle pains Other  reaction(s): Other  . Sulfamethoxazole Other (See Comments)    Mouth ulcers   . Wheat Bran Diarrhea and Nausea And Vomiting    Cramping   . Ciprofloxacin   . Levofloxacin   . Pravastatin Sodium   . Rosuvastatin Calcium   . Simvastatin   . Sulfa Antibiotics Other (See Comments)    Mouth ulcers  . Sulfites Other (See Comments)    Mouth ulcers  . Trichophyton Itching and Other (See Comments)    sinusitis        Review of Systems:  Constitutional: No recent illness, no fever/chills  HEENT: No  headache, no vision change  Cardiac: No  chest pain, No  pressure, No palpitations  Respiratory:  No  shortness of breath. No  Cough  Gastrointestinal: No  abdominal pain  Musculoskeletal: +new myalgia/arthralgia  Skin: No  Rash   Exam:  BP (!) 176/77 (BP Location: Left Arm, Patient Position: Sitting, Cuff Size: Small)   Pulse 65   Temp 98.1 F (36.7 C) (Oral)   Wt 95 lb 4.8 oz (43.2 kg)   BMI 18.77  kg/m   Constitutional: VS see above. General Appearance: alert, well-developed, well-nourished, NAD  Eyes: Normal lids and conjunctive, non-icteric sclera  Neck: No masses, trachea midline.   Respiratory: Normal respiratory effort.  Musculoskeletal: Gait normal. Symmetric and independent movement of all extremities.   Neurological: Normal balance/coordination. No tremor.  Skin: warm, dry, intact.   Psychiatric: Normal judgment/insight. Normal mood and affect. Oriented x3.       Visit summary with medication list and pertinent instructions was printed for patient to review, patient was advised to alert [place] if any updates are needed. All questions at time of visit were answered - patient instructed to contact office with any additional concerns. ER/RTC precautions were reviewed with the patient and understanding verbalized.   Note: Total time spent 25 minutes, greater than 50% of the visit was spent face-to-face counseling and coordinating care for the following: The  primary encounter diagnosis was Left foot pain. Diagnoses of Anxiety and depression and Anemia, chronic disease were also pertinent to this visit.Marland Kitchen  Please note: voice recognition software was used to produce this document, and typos may escape review. Please contact Dr. Sheppard Coil for any needed clarifications.    Follow up plan: Return if symptoms worsen or fail to improve.

## 2018-10-03 NOTE — Telephone Encounter (Signed)
Spoke with patient to confirm new patient appt 9/28 at 130 pm

## 2018-10-04 ENCOUNTER — Encounter: Payer: Self-pay | Admitting: Osteopathic Medicine

## 2018-10-04 DIAGNOSIS — M79671 Pain in right foot: Secondary | ICD-10-CM

## 2018-10-04 DIAGNOSIS — L600 Ingrowing nail: Secondary | ICD-10-CM

## 2018-10-04 DIAGNOSIS — M79672 Pain in left foot: Secondary | ICD-10-CM

## 2018-10-05 MED ORDER — CICLOPIROX 8 % EX SOLN: Freq: Every day | CUTANEOUS | 0 refills | Status: DC

## 2018-10-09 ENCOUNTER — Other Ambulatory Visit: Payer: Self-pay

## 2018-10-09 ENCOUNTER — Ambulatory Visit (INDEPENDENT_AMBULATORY_CARE_PROVIDER_SITE_OTHER): Payer: 59 | Admitting: Pharmacist Clinician (PhC)/ Clinical Pharmacy Specialist

## 2018-10-09 DIAGNOSIS — E7800 Pure hypercholesterolemia, unspecified: Secondary | ICD-10-CM | POA: Diagnosis not present

## 2018-10-09 DIAGNOSIS — I2510 Atherosclerotic heart disease of native coronary artery without angina pectoris: Secondary | ICD-10-CM

## 2018-10-09 DIAGNOSIS — I2583 Coronary atherosclerosis due to lipid rich plaque: Secondary | ICD-10-CM

## 2018-10-09 NOTE — Patient Instructions (Signed)
We will start the paperwork to get Repatha covered by your insurance company.  This usually takes about 3-5 days.  Once we get the approval we can send the prescription to your pharmacy.   You will need to activate the copay card before taking it to your pharmacy  Evolocumab injection What is this medicine? EVOLOCUMAB (e voe LOK ue mab) is known as a PCSK9 inhibitor. It is used to lower the level of cholesterol in the blood. It may be used alone or in combination with other cholesterol-lowering drugs. This drug may also be used to reduce the risk of heart attack, stroke, and certain types of heart surgery in patients with heart disease. This medicine may be used for other purposes; ask your health care provider or pharmacist if you have questions. COMMON BRAND NAME(S): Repatha What should I tell my health care provider before I take this medicine? They need to know if you have any of these conditions:  an unusual or allergic reaction to evolocumab, other medicines, foods, dyes, or preservatives  pregnant or trying to get pregnant  breast-feeding How should I use this medicine? This medicine is for injection under the skin. You will be taught how to prepare and give this medicine. Use exactly as directed. Take your medicine at regular intervals. Do not take your medicine more often than directed. It is important that you put your used needles and syringes in a special sharps container. Do not put them in a trash can. If you do not have a sharps container, call your pharmacist or health care provider to get one. Talk to your pediatrician regarding the use of this medicine in children. While this drug may be prescribed for children as young as 13 years for selected conditions, precautions do apply. Overdosage: If you think you have taken too much of this medicine contact a poison control center or emergency room at once. NOTE: This medicine is only for you. Do not share this medicine with  others. What if I miss a dose? If you miss a dose, take it as soon as you can if there are more than 7 days until the next scheduled dose, or skip the missed dose and take the next dose according to your original schedule. Do not take double or extra doses. What may interact with this medicine? Interactions are not expected. This list may not describe all possible interactions. Give your health care provider a list of all the medicines, herbs, non-prescription drugs, or dietary supplements you use. Also tell them if you smoke, drink alcohol, or use illegal drugs. Some items may interact with your medicine. What should I watch for while using this medicine? You may need blood work while you are taking this medicine. What side effects may I notice from receiving this medicine? Side effects that you should report to your doctor or health care professional as soon as possible:  allergic reactions like skin rash, itching or hives, swelling of the face, lips, or tongue  signs and symptoms of high blood sugar such as dizziness; dry mouth; dry skin; fruity breath; nausea; stomach pain; increased hunger or thirst; increased urination  signs and symptoms of infection like fever or chills; cough; sore throat; pain or trouble passing urine Side effects that usually do not require medical attention (report to your doctor or health care professional if they continue or are bothersome):  diarrhea  nausea  muscle pain  pain, redness, or irritation at site where injected This list may not  describe all possible side effects. Call your doctor for medical advice about side effects. You may report side effects to FDA at 1-800-FDA-1088. Where should I keep my medicine? Keep out of the reach of children. You will be instructed on how to store this medicine. Throw away any unused medicine after the expiration date on the label. NOTE: This sheet is a summary. It may not cover all possible information. If you  have questions about this medicine, talk to your doctor, pharmacist, or health care provider.  2020 Elsevier/Gold Standard (2016-08-22 13:31:00)

## 2018-10-09 NOTE — Progress Notes (Signed)
10/10/2018 Brittney Pace 1955-12-13 [id number]   HPI:  Brittney Pace is a 63 y.o. female patient of Dr Stanford Breed, who presents today for a lipid clinic evaluation.  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    She has tried multiple stain drugs in the past however all caused her to have myalgias within a short time.    Current Medications: none  Cholesterol Goals: LDL < 70   Intolerant/previously tried:  Lipitor,crestor, zocor, pravastatin  Family history: father with hypertension, multiple MI, CABG x 4, died at 25; mother had breast cancer, hypertension died at 39, one sister with breast cancer  Diet: some eating out (take home), lots of salads, eats organic, grass fed meats  Exercise:  No regular exercise - autoimmune issues, states she was told not to exercise  Labs:  06/2018: TC 204, TG 138, HDL 62, LDL 116   Current Outpatient Medications  Medication Sig Dispense Refill  . arginine 500 MG tablet Take 500 mg by mouth as directed.     . Armodafinil 250 MG tablet Take 0.5 tablets (125 mg total) by mouth daily. 45 tablet 1  . aspirin 325 MG EC tablet Take 325 mg by mouth daily.    . carvedilol (COREG) 3.125 MG tablet TAKE 1 TABLET TWICE DAILY  WITH MEALS. 180 tablet 0  . Cholecalciferol (VITAMIN D3) 5000 UNITS TABS Take 1,000 Units by mouth daily.     . ciclopirox (PENLAC) 8 % solution Apply topically at bedtime. Apply over nail and surrounding skin. Apply daily over previous coat. After seven (7) days, may remove with alcohol and continue cycle. 6.6 mL 0  . cyclobenzaprine (FLEXERIL) 10 MG tablet Take 0.5-1 tablets (5-10 mg total) by mouth 3 (three) times daily as needed for muscle spasms. Caution: can cause drowsiness 60 tablet 1  . diphenhydrAMINE HCl (BENADRYL ALLERGY PO) Take  by mouth.    . ELDERBERRY PO Take by mouth.    . Esomeprazole Magnesium (NEXIUM PO) Take 20 mg by mouth every morning.     . fluocinonide (LIDEX) 0.05 % external solution APPLY SOLUTION TOPICALLY ONCE DAILY AS NEEDED TO SCALP    . folic acid (FOLVITE) 1 MG tablet Take by mouth.    . Insulin Human (INSULIN PUMP) SOLN Inject into the skin continuous. Novolog insulin    . KRILL OIL PO Take by mouth.    . Linoleic Acid Conjugated 1000 MG CAPS Take 1,000 mg by mouth daily.     Marland Kitchen MELATONIN PO Take by mouth.    . nitroGLYCERIN (NITROLINGUAL) 0.4 MG/SPRAY spray Place 1 spray under the tongue every 5 (five) minutes x 3 doses as needed for chest pain. 12 g 1  . NON FORMULARY Chloroella    . OVER THE COUNTER MEDICATION Take 750 mg by mouth at bedtime. GABA    . OVER THE COUNTER MEDICATION as directed. Magnesium L-Treonate    . OVER THE COUNTER MEDICATION as directed. Omega EPA/DHA    . OVER THE COUNTER MEDICATION as directed. Grape fruit seed extract    . potassium chloride (KLOR-CON M10) 10 MEQ tablet Take 1 tablet (10 mEq total) by mouth 2 (two) times daily. No more refills until appt is scheduled 180 tablet 1  . thyroid (ARMOUR THYROID) 60 MG tablet Take 1 tablet (60  mg total) by mouth daily before breakfast. 90 tablet 0  . torsemide ([name]) 20 MG tablet TAKE 1 TABLET ONCE DAILY INTHE MORNING. MAY TAKE AN   EXTRA 1/2 TABLET AS        NEEDED 135 tablet 0   No current facility-administered medications for this visit.     Allergies  Allergen Reactions  . Dilaudid [Hydromorphone] Other (See Comments)    Because of continuous glucose monitor  . Morphine Anaphylaxis and Shortness Of Breath    Other reaction(s): Respiratory Distress (ALLERGY/intolerance)  . Morphine And Related Shortness Of Breath  . Penicillins Hives    Has patient had a PCN reaction causing immediate rash, facial/tongue/throat swelling, SOB or lightheadedness with hypotension: Yes Has patient had a PCN reaction causing severe  rash involving mucus membranes or skin necrosis: No Has patient had a PCN reaction that required hospitalization No Has patient had a PCN reaction occurring within the last 10 years: No If all of the above answers are "NO", then may proceed with Cephalosporin use.  . Tramadol Anaphylaxis  . Tramadol-Acetaminophen Other (See Comments)    Small vessel heart attack Other reaction(s): Cardiovascular Arrest (ALLERGY/intolerance), Other (See Comments)   . Acetaminophen Other (See Comments)    Alters insulin pump readings   . Amitriptyline Other (See Comments)    Severe headache/ out of body feeling  . Codeine Other (See Comments)    Severe headaches/ out of body feeling   . Gluten Meal Diarrhea and Nausea And Vomiting    Cramping   . Ibuprofen Other (See Comments)    Messes up CGM reading on glucose monitor Other reaction(s): Other, Other (See Comments)   . Other Swelling and Other (See Comments)    Cats itching Mold sinuses Cat hair and scratches cause swelling    . Propoxyphene Other (See Comments)    Severe headaches / out of body feeling   . Shellfish Allergy Diarrhea and Nausea And Vomiting  . Statins Other (See Comments)    Muscle pains Other reaction(s): Other  . Sulfamethoxazole Other (See Comments)    Mouth ulcers   . Wheat Bran Diarrhea and Nausea And Vomiting    Cramping   . Ciprofloxacin   . Levofloxacin   . Pravastatin Sodium   . Rosuvastatin Calcium   . Simvastatin   . Sulfa Antibiotics Other (See Comments)    Mouth ulcers  . Sulfites Other (See Comments)    Mouth ulcers  . Trichophyton Itching and Other (See Comments)    sinusitis     Past Medical History:  Diagnosis Date  . Anemia   . Arthritis   . 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 Chronic kidney disease    stage 3  . Coronary artery disease   . Depression   . Diabetes mellitus without complication (H[facility])    Type 1  . Diabetic retinopathy ([place])   . Family history of adverse  reaction to anesthesia    mother: " while she was under she stopped breathing."  . Fibromyalgia   . Gastroparesis   . GERD (gastroesophageal reflux disease)   . Headache    migraines  . Hypothyroidism   . IBS (irritable bowel syndrome)   . Idiopathic edema   . Lyme disease   . Mitral valve prolapse   . Myocardial infarction ([place])    1 major in 1999 and 2 minor " small vessel disease."  . Osteoporosis   . Peripheral neuropathy   .  Peripheral vascular disease ([place])   . 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   Hyperlipidemia Patient with ASCVD and hyperlipidemia, currently not to LDL goa.  She was unable to tolerate multiple statin drugs due to myalgias.  Reviewed options of PCSK-9 inhibitors and bempedoic acid.  Discussed mechanism of action, side effects and expected LDL lowering.  Patient willing to try Repatha 948 mg Sureclick.  Will start paperwork to get PA from her insurance company and gave her $5 copay card from [employer].  Once started, will repeat lipid labs after 5-6 doses.     Tommy Medal PharmD CPP [facility]

## 2018-10-10 ENCOUNTER — Encounter: Payer: Self-pay | Admitting: Pharmacist Clinician (PhC)/ Clinical Pharmacy Specialist

## 2018-10-10 NOTE — Assessment & Plan Note (Signed)
Patient with ASCVD and hyperlipidemia, currently not to LDL goa.  She was unable to tolerate multiple statin drugs due to myalgias.  Reviewed options of PCSK-9 inhibitors and bempedoic acid.  Discussed mechanism of action, side effects and expected LDL lowering.  Patient willing to try Repatha 735 mg Sureclick.  Will start paperwork to get PA from her insurance company and gave her $5 copay card from [employer].  Once started, will repeat lipid labs after 5-6 doses.

## 2018-10-22 ENCOUNTER — Ambulatory Visit (INDEPENDENT_AMBULATORY_CARE_PROVIDER_SITE_OTHER): Payer: 59 | Admitting: Podiatry

## 2018-10-22 ENCOUNTER — Inpatient Hospital Stay: Payer: 59

## 2018-10-22 ENCOUNTER — Other Ambulatory Visit: Payer: Self-pay | Admitting: Podiatry

## 2018-10-22 ENCOUNTER — Encounter: Payer: Self-pay | Admitting: Podiatry

## 2018-10-22 ENCOUNTER — Encounter: Payer: Self-pay | Admitting: Hematology & Oncology

## 2018-10-22 ENCOUNTER — Telehealth: Payer: Self-pay | Admitting: *Deleted

## 2018-10-22 ENCOUNTER — Other Ambulatory Visit: Payer: Self-pay

## 2018-10-22 ENCOUNTER — Ambulatory Visit: Payer: Medicare Other

## 2018-10-22 ENCOUNTER — Ambulatory Visit (INDEPENDENT_AMBULATORY_CARE_PROVIDER_SITE_OTHER): Payer: 59

## 2018-10-22 ENCOUNTER — Inpatient Hospital Stay: Payer: 59 | Attending: Hematology & Oncology | Admitting: Hematology & Oncology

## 2018-10-22 VITALS — BP 151/69 | HR 81 | Temp 97.5°F | Resp 18 | Ht 59.0 in | Wt 95.0 lb

## 2018-10-22 DIAGNOSIS — M722 Plantar fascial fibromatosis: Secondary | ICD-10-CM

## 2018-10-22 DIAGNOSIS — N183 Chronic kidney disease, stage 3 unspecified: Secondary | ICD-10-CM

## 2018-10-22 DIAGNOSIS — Z8673 Personal history of transient ischemic attack (TIA), and cerebral infarction without residual deficits: Secondary | ICD-10-CM | POA: Insufficient documentation

## 2018-10-22 DIAGNOSIS — E1151 Type 2 diabetes mellitus with diabetic peripheral angiopathy without gangrene: Secondary | ICD-10-CM | POA: Insufficient documentation

## 2018-10-22 DIAGNOSIS — I2583 Coronary atherosclerosis due to lipid rich plaque: Secondary | ICD-10-CM

## 2018-10-22 DIAGNOSIS — E1022 Type 1 diabetes mellitus with diabetic chronic kidney disease: Secondary | ICD-10-CM

## 2018-10-22 DIAGNOSIS — N189 Chronic kidney disease, unspecified: Secondary | ICD-10-CM | POA: Diagnosis not present

## 2018-10-22 DIAGNOSIS — Z79899 Other long term (current) drug therapy: Secondary | ICD-10-CM | POA: Diagnosis not present

## 2018-10-22 DIAGNOSIS — D638 Anemia in other chronic diseases classified elsewhere: Secondary | ICD-10-CM

## 2018-10-22 DIAGNOSIS — M79672 Pain in left foot: Secondary | ICD-10-CM

## 2018-10-22 DIAGNOSIS — L600 Ingrowing nail: Secondary | ICD-10-CM

## 2018-10-22 DIAGNOSIS — H938X3 Other specified disorders of ear, bilateral: Secondary | ICD-10-CM | POA: Insufficient documentation

## 2018-10-22 DIAGNOSIS — I2510 Atherosclerotic heart disease of native coronary artery without angina pectoris: Secondary | ICD-10-CM

## 2018-10-22 DIAGNOSIS — D631 Anemia in chronic kidney disease: Secondary | ICD-10-CM

## 2018-10-22 DIAGNOSIS — F17210 Nicotine dependence, cigarettes, uncomplicated: Secondary | ICD-10-CM | POA: Insufficient documentation

## 2018-10-22 DIAGNOSIS — H9311 Tinnitus, right ear: Secondary | ICD-10-CM | POA: Insufficient documentation

## 2018-10-22 DIAGNOSIS — R42 Dizziness and giddiness: Secondary | ICD-10-CM | POA: Insufficient documentation

## 2018-10-22 HISTORY — DX: Anemia in chronic kidney disease: D63.1

## 2018-10-22 LAB — CMP (CANCER CENTER ONLY)
ALT: 18 U/L (ref 0–44)
AST: 26 U/L (ref 15–41)
Albumin: 4 g/dL (ref 3.5–5.0)
Alkaline Phosphatase: 81 U/L (ref 38–126)
Anion gap: 7 (ref 5–15)
BUN: 25 mg/dL — ABNORMAL HIGH (ref 8–23)
CO2: 29 mmol/L (ref 22–32)
Calcium: 9.8 mg/dL (ref 8.9–10.3)
Chloride: 103 mmol/L (ref 98–111)
Creatinine: 1.1 mg/dL — ABNORMAL HIGH (ref 0.44–1.00)
GFR, Est AFR Am: >60 mL/min (ref >60)
GFR, Estimated: 53 mL/min — ABNORMAL LOW (ref >60)
Glucose, Bld: 171 mg/dL — ABNORMAL HIGH (ref 70–99)
Potassium: 4.4 mmol/L (ref 3.5–5.1)
Sodium: 139 mmol/L (ref 135–145)
Total Bilirubin: 0.5 mg/dL (ref 0.3–1.2)
Total Protein: 6.7 g/dL (ref 6.5–8.1)

## 2018-10-22 LAB — CBC WITH DIFFERENTIAL (CANCER CENTER ONLY)
Abs Immature Granulocytes: 0.06 10*3/uL (ref 0.00–0.07)
Basophils Absolute: 0.1 10*3/uL (ref 0.0–0.1)
Basophils Relative: 1 %
Eosinophils Absolute: 0.3 10*3/uL (ref 0.0–0.5)
Eosinophils Relative: 4 %
HCT: 31.2 % — ABNORMAL LOW (ref 36.0–46.0)
Hemoglobin: 10.1 g/dL — ABNORMAL LOW (ref 12.0–15.0)
Immature Granulocytes: 1 %
Lymphocytes Relative: 25 %
Lymphs Abs: 2 10*3/uL (ref 0.7–4.0)
MCH: 27.7 pg (ref 26.0–34.0)
MCHC: 32.4 g/dL (ref 30.0–36.0)
MCV: 85.5 fL (ref 80.0–100.0)
Monocytes Absolute: 0.7 10*3/uL (ref 0.1–1.0)
Monocytes Relative: 9 %
Neutro Abs: 4.7 10*3/uL (ref 1.7–7.7)
Neutrophils Relative %: 60 %
Platelet Count: 337 10*3/uL (ref 150–400)
RBC: 3.65 MIL/uL — ABNORMAL LOW (ref 3.87–5.11)
RDW: 14.6 % (ref 11.5–15.5)
WBC Count: 7.8 10*3/uL (ref 4.0–10.5)
nRBC: 0 % (ref 0.0–0.2)

## 2018-10-22 LAB — RETICULOCYTES
Immature Retic Fract: 5.8 % (ref 2.3–15.9)
RBC.: 3.54 MIL/uL — ABNORMAL LOW (ref 3.87–5.11)
Retic Count, Absolute: 32.6 10*3/uL (ref 19.0–186.0)
Retic Ct Pct: 0.9 % (ref 0.4–3.1)

## 2018-10-22 LAB — SAVE SMEAR(SSMR), FOR PROVIDER SLIDE REVIEW

## 2018-10-22 LAB — VITAMIN B12: Vitamin B-12: 450 pg/mL (ref 180–914)

## 2018-10-22 MED ORDER — EPOETIN ALFA-EPBX 10000 UNIT/ML IJ SOLN
10000.0000 [IU] | Freq: Once | INTRAMUSCULAR | Status: AC
  Administered 2018-10-22: 10000 [IU] via SUBCUTANEOUS
  Filled 2018-10-22: qty 1

## 2018-10-22 NOTE — Progress Notes (Signed)
   Subjective:    Patient ID: Brittney Pace, female    DOB: [birth year], 63 y.o.   MRN: [id number]  HPI    Review of Systems  Constitutional: Positive for fatigue.  All other systems reviewed and are negative.      Objective:   Physical Exam        Assessment & Plan:

## 2018-10-22 NOTE — Patient Instructions (Addendum)
Soak Instructions    THE DAY AFTER THE PROCEDURE  Place 1/4 cup of epsom salts in a quart of warm tap water.  Submerge your foot or feet with outer bandage intact for the initial soak; this will allow the bandage to become moist and wet for easy lift off.  Once you remove your bandage, continue to soak in the solution for 20 minutes.  This soak should be done twice a day.  Next, remove your foot or feet from solution, blot dry the affected area and cover.  You may use a band aid large enough to cover the area or use gauze and tape.  Apply other medications to the area as directed by the doctor such as polysporin neosporin.  IF YOUR SKIN BECOMES IRRITATED WHILE USING THESE INSTRUCTIONS, IT IS OKAY TO SWITCH TO  WHITE VINEGAR AND WATER. Or you may use antibacterial soap and water to keep the toe clean  Monitor for any signs/symptoms of infection. Call the office immediately if any occur or go directly to the emergency room. Call with any questions/concerns.    [facility] Instructions-Post Nail Surgery  You have had your ingrown toenail and root treated with a chemical.  This chemical causes a burn that will drain and ooze like a blister.  This can drain for 6-8 weeks or longer.  It is important to keep this area clean, covered, and follow the soaking instructions dispensed at the time of your surgery.  This area will eventually dry and form a scab.  Once the scab forms you no longer need to soak or apply a dressing.  If at any time you experience an increase in pain, redness, swelling, or drainage, you should contact the office as soon as possible.  Plantar Fasciitis (Heel Spur Syndrome) with Rehab The plantar fascia is a fibrous, ligament-like, soft-tissue structure that spans the bottom of the foot. Plantar fasciitis is a condition that causes pain in the foot due to inflammation of the tissue. SYMPTOMS   Pain and tenderness on the underneath side of the foot.  Pain that worsens with  standing or walking. CAUSES  Plantar fasciitis is caused by irritation and injury to the plantar fascia on the underneath side of the foot. Common mechanisms of injury include:  Direct trauma to bottom of the foot.  Damage to a small nerve that runs under the foot where the main fascia attaches to the heel bone.  Stress placed on the plantar fascia due to bone spurs. RISK INCREASES WITH:   Activities that place stress on the plantar fascia (running, jumping, pivoting, or cutting).  Poor strength and flexibility.  Improperly fitted shoes.  Tight calf muscles.  Flat feet.  Failure to warm-up properly before activity.  Obesity. PREVENTION  Warm up and stretch properly before activity.  Allow for adequate recovery between workouts.  Maintain physical fitness:  Strength, flexibility, and endurance.  Cardiovascular fitness.  Maintain a health body weight.  Avoid stress on the plantar fascia.  Wear properly fitted shoes, including arch supports for individuals who have flat feet.  PROGNOSIS  If treated properly, then the symptoms of plantar fasciitis usually resolve without surgery. However, occasionally surgery is necessary.  RELATED COMPLICATIONS   Recurrent symptoms that may result in a chronic condition.  Problems of the lower back that are caused by compensating for the injury, such as limping.  Pain or weakness of the foot during push-off following surgery.  Chronic inflammation, scarring, and partial or complete fascia tear,  occurring more often from repeated injections.  TREATMENT  Treatment initially involves the use of ice and medication to help reduce pain and inflammation. The use of strengthening and stretching exercises may help reduce pain with activity, especially stretches of the Achilles tendon. These exercises may be performed at home or with a therapist. Your caregiver may recommend that you use heel cups of arch supports to help reduce stress on  the plantar fascia. Occasionally, corticosteroid injections are given to reduce inflammation. If symptoms persist for greater than 6 months despite non-surgical (conservative), then surgery may be recommended.   MEDICATION   If pain medication is necessary, then nonsteroidal anti-inflammatory medications, such as aspirin and ibuprofen, or other minor pain relievers, such as acetaminophen, are often recommended.  Do not take pain medication within 7 days before surgery.  Prescription pain relievers may be given if deemed necessary by your caregiver. Use only as directed and only as much as you need.  Corticosteroid injections may be given by your caregiver. These injections should be reserved for the most serious cases, because they may only be given a certain number of times.  HEAT AND COLD  Cold treatment (icing) relieves pain and reduces inflammation. Cold treatment should be applied for 10 to 15 minutes every 2 to 3 hours for inflammation and pain and immediately after any activity that aggravates your symptoms. Use ice packs or massage the area with a piece of ice (ice massage).  Heat treatment may be used prior to performing the stretching and strengthening activities prescribed by your caregiver, physical therapist, or athletic trainer. Use a heat pack or soak the injury in warm water.  SEEK IMMEDIATE MEDICAL CARE IF:  Treatment seems to offer no benefit, or the condition worsens.  Any medications produce adverse side effects.  EXERCISES- RANGE OF MOTION (ROM) AND STRETCHING EXERCISES - Plantar Fasciitis (Heel Spur Syndrome) These exercises may help you when beginning to rehabilitate your injury. Your symptoms may resolve with or without further involvement from your physician, physical therapist or athletic trainer. While completing these exercises, remember:   Restoring tissue flexibility helps normal motion to return to the joints. This allows healthier, less painful movement and  activity.  An effective stretch should be held for at least 30 seconds.  A stretch should never be painful. You should only feel a gentle lengthening or release in the stretched tissue.  RANGE OF MOTION - Toe Extension, Flexion  Sit with your right / left leg crossed over your opposite knee.  Grasp your toes and gently pull them back toward the top of your foot. You should feel a stretch on the bottom of your toes and/or foot.  Hold this stretch for 10 seconds.  Now, gently pull your toes toward the bottom of your foot. You should feel a stretch on the top of your toes and or foot.  Hold this stretch for 10 seconds. Repeat  times. Complete this stretch 3 times per day.   RANGE OF MOTION - Ankle Dorsiflexion, Active Assisted  Remove shoes and sit on a chair that is preferably not on a carpeted surface.  Place right / left foot under knee. Extend your opposite leg for support.  Keeping your heel down, slide your right / left foot back toward the chair until you feel a stretch at your ankle or calf. If you do not feel a stretch, slide your bottom forward to the edge of the chair, while still keeping your heel down.  Hold this stretch  for 10 seconds. Repeat 3 times. Complete this stretch 2 times per day.   STRETCH  Gastroc, Standing  Place hands on wall.  Extend right / left leg, keeping the front knee somewhat bent.  Slightly point your toes inward on your back foot.  Keeping your right / left heel on the floor and your knee straight, shift your weight toward the wall, not allowing your back to arch.  You should feel a gentle stretch in the right / left calf. Hold this position for 10 seconds. Repeat 3 times. Complete this stretch 2 times per day.  STRETCH  Soleus, Standing  Place hands on wall.  Extend right / left leg, keeping the other knee somewhat bent.  Slightly point your toes inward on your back foot.  Keep your right / left heel on the floor, bend your back  knee, and slightly shift your weight over the back leg so that you feel a gentle stretch deep in your back calf.  Hold this position for 10 seconds. Repeat 3 times. Complete this stretch 2 times per day.  STRETCH  Gastrocsoleus, Standing  Note: This exercise can place a lot of stress on your foot and ankle. Please complete this exercise only if specifically instructed by your caregiver.   Place the ball of your right / left foot on a step, keeping your other foot firmly on the same step.  Hold on to the wall or a rail for balance.  Slowly lift your other foot, allowing your body weight to press your heel down over the edge of the step.  You should feel a stretch in your right / left calf.  Hold this position for 10 seconds.  Repeat this exercise with a slight bend in your right / left knee. Repeat 3 times. Complete this stretch 2 times per day.   STRENGTHENING EXERCISES - Plantar Fasciitis (Heel Spur Syndrome)  These exercises may help you when beginning to rehabilitate your injury. They may resolve your symptoms with or without further involvement from your physician, physical therapist or athletic trainer. While completing these exercises, remember:   Muscles can gain both the endurance and the strength needed for everyday activities through controlled exercises.  Complete these exercises as instructed by your physician, physical therapist or athletic trainer. Progress the resistance and repetitions only as guided.  STRENGTH - Towel Curls  Sit in a chair positioned on a non-carpeted surface.  Place your foot on a towel, keeping your heel on the floor.  Pull the towel toward your heel by only curling your toes. Keep your heel on the floor. Repeat 3 times. Complete this exercise 2 times per day.  STRENGTH - Ankle Inversion  Secure one end of a rubber exercise band/tubing to a fixed object (table, pole). Loop the other end around your foot just before your toes.  Place your  fists between your knees. This will focus your strengthening at your ankle.  Slowly, pull your big toe up and in, making sure the band/tubing is positioned to resist the entire motion.  Hold this position for 10 seconds.  Have your muscles resist the band/tubing as it slowly pulls your foot back to the starting position. Repeat 3 times. Complete this exercises 2 times per day.  Document Released: 01/10/2005 Document Revised: 04/04/2011 Document Reviewed: 04/24/2008 [facility] Patient Information 2014 [place], [place].

## 2018-10-22 NOTE — Progress Notes (Signed)
Referral MD  Reason for Referral: Anemia of erythropoietin deficiency; insulin-dependent diabetes; history of TIAs  Chief Complaint  Patient presents with  . New Patient (Initial Visit)  : I really need to be on my Aranesp.  HPI: [name] is a very nice 63 year old white female.  She is originally from [place].  She has lived in several places.  She and her husband finally moved up to the [place] area a few years ago.  She has multiple, multiple medical problems.  She has insulin-dependent diabetes.  She has coronary artery disease.  She has had open heart surgery.  She has chronic kidney disease.  She has babesiosis/Lyme disease.  She is seen multiple hematologist.  She initially was followed at [facility].  She then was seen at [facility]..  She was getting Aranesp.  I am not sure exactly what happened at [facility] but she said that they just were not helping her.  She was kindly referred to the [facility] to see how we might be able to help.  She does feel tired.  She has not had Aranesp performed a few months.  She says that she has had past TIAs.  I am not sure exactly what this means.  The only MRI that I can find on her was back in January 2018.  This showed chronic microvascular changes and small chronic lacunar infarcts.  She had severe cortical atrophy.  She does not smoke.  He says she smoked many years ago but stopped.  As far she knows, there is no history of blood problems in her family.  There is no history of cancer.  She has had multiple surgeries.  She says that she cannot have mammograms because of her fibromyalgia and the pain that mammograms cause.  She says that her last colonoscopy was 2 or 3 years ago.  She has no obvious neurological issues as far as I can tell.  She has had no fever.  There is been no weight loss.  She is a tiny woman.  She is not a vegetarian.  Overall, I would have to say that her performance status is ECOG 1.   Past  Medical History:  Diagnosis Date  . Anemia   . Arthritis   . 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 Chronic kidney disease    stage 3  . Coronary artery disease   . Depression   . Diabetes mellitus without complication (HCC)    Type 1  . Diabetic retinopathy ([place])   . 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 Family history of adverse reaction to anesthesia    mother: " while she was under she stopped breathing."  . Fibromyalgia   . Gastroparesis   . GERD (gastroesophageal reflux disease)   . Headache    migraines  . Hypothyroidism   . IBS (irritable bowel syndrome)   . Idiopathic edema   . Lyme disease   . Mitral valve prolapse   . Myocardial infarction ([facility])    1 major in 1999 and 2 minor " small vessel disease."  . Osteoporosis   . Peripheral neuropathy   . Peripheral vascular disease ([place])   . 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 ABDOMINAL HYSTERECTOMY    .  APPENDECTOMY    . BREAST SURGERY     B/L biopsy and lumpectomy   . CARDIAC CATHETERIZATION    . CARPAL TUNNEL RELEASE    . CATARACT EXTRACTION W/ INTRAOCULAR LENS IMPLANT     right eye  . COLONOSCOPY W/ BIOPSIES AND POLYPECTOMY    . CORONARY ARTERY BYPASS GRAFT    . coronary artery stents     at [facility] and [place]  . ECTOPIC PREGNANCY SURGERY    . NASAL SEPTUM SURGERY    . OPEN REDUCTION INTERNAL FIXATION (ORIF) DISTAL RADIAL FRACTURE Right 05/06/2015   Procedure: OPEN REDUCTION INTERNAL FIXATION (ORIF) RIGHT DISTAL RADIAL FRACTURE AND REPAIRS AS NEEDED;  Surgeon: Iran Planas, MD;  Location: [facility];  Service: Orthopedics;  Laterality: Right;  . ORIF WRIST FRACTURE Left 11/05/2014  . ORIF WRIST FRACTURE Left 11/05/2014   Procedure: OPEN REDUCTION INTERNAL FIXATION (ORIF) LEFT WRIST FRACTURE AND REPAIR AS INDICATED;  Surgeon: Iran Planas, MD;  Location: [facility];  Service: Orthopedics;  Laterality: Left;  . TRIGGER FINGER RELEASE    :   Current Outpatient Medications:  .  arginine 500 MG tablet, Take 500 mg by mouth as directed. , Disp: , Rfl:  .  Armodafinil 250 MG tablet, Take 0.5 tablets (125 mg total) by mouth daily., Disp: 45 tablet, Rfl: 1 .  aspirin 325 MG EC tablet, Take 325 mg by mouth daily., Disp: , Rfl:  .  carvedilol (COREG) 3.125 MG tablet, TAKE 1 TABLET TWICE DAILY  WITH MEALS., Disp: 180 tablet, Rfl: 0 .  Cholecalciferol (VITAMIN D3) 5000 UNITS TABS, Take 1,000 Units by mouth daily. , Disp: , Rfl:  .  ciclopirox (PENLAC) 8 % solution, Apply topically at bedtime. Apply over nail and surrounding skin. Apply daily over previous coat. After seven (7) days, may remove with alcohol and continue cycle., Disp: 6.6 mL, Rfl: 0 .  cyclobenzaprine (FLEXERIL) 10 MG tablet, Take 0.5-1 tablets (5-10 mg total) by mouth 3 (three) times daily as needed for muscle spasms. Caution: can cause drowsiness, Disp: 60 tablet, Rfl: 1 .  diphenhydrAMINE HCl (BENADRYL ALLERGY PO), Take by mouth., Disp: , Rfl:  .  ELDERBERRY PO, Take by mouth., Disp: , Rfl:  .  Esomeprazole Magnesium (NEXIUM PO), Take 20 mg by mouth every morning. , Disp: , Rfl:  .  fluocinonide (LIDEX) 0.05 % external solution, APPLY SOLUTION TOPICALLY ONCE DAILY AS NEEDED TO SCALP, Disp: , Rfl:  .  folic acid (FOLVITE) 1 MG tablet, Take by mouth., Disp: , Rfl:  .  Insulin Human (INSULIN PUMP) SOLN, Inject into the skin continuous. Novolog insulin, Disp: , Rfl:  .  KRILL OIL PO, Take by mouth., Disp: , Rfl:  .  Linoleic Acid Conjugated 1000 MG CAPS, Take 1,000 mg by mouth daily. , Disp: , Rfl:  .  MELATONIN PO, Take by mouth., Disp: , Rfl:  .  nitroGLYCERIN (NITROLINGUAL) 0.4 MG/SPRAY spray, Place 1 spray under the tongue every 5 (five) minutes x 3 doses as needed for chest pain., Disp: 12 g, Rfl: 1 .  NON FORMULARY, Chloroella, Disp: , Rfl:  .  OVER THE COUNTER  MEDICATION, Take 750 mg by mouth at bedtime. GABA, Disp: , Rfl:  .  OVER THE COUNTER MEDICATION, as directed. Magnesium L-Treonate, Disp: , Rfl:  .  OVER THE COUNTER MEDICATION, as directed. Omega EPA/DHA, Disp: , Rfl:  .  potassium chloride (KLOR-CON M10) 10 MEQ tablet, Take 1 tablet (10 mEq total) by mouth 2 (two) times  daily. No more refills until appt is scheduled, Disp: 180 tablet, Rfl: 1 .  thyroid (ARMOUR THYROID) 60 MG tablet, Take 1 tablet (60 mg total) by mouth daily before breakfast., Disp: 90 tablet, Rfl: 0 .  torsemide ([name]) 20 MG tablet, TAKE 1 TABLET ONCE DAILY INTHE MORNING. MAY TAKE AN   EXTRA 1/2 TABLET AS        NEEDED, Disp: 135 tablet, Rfl: 0:  :  Allergies  Allergen Reactions  . Dilaudid [Hydromorphone] Other (See Comments)    Because of continuous glucose monitor  . Morphine Anaphylaxis and Shortness Of Breath    Other reaction(s): Respiratory Distress (ALLERGY/intolerance)  . Morphine And Related Shortness Of Breath  . Penicillins Hives    Has patient had a PCN reaction causing immediate rash, facial/tongue/throat swelling, SOB or lightheadedness with hypotension: Yes Has patient had a PCN reaction causing severe rash involving mucus membranes or skin necrosis: No Has patient had a PCN reaction that required hospitalization No Has patient had a PCN reaction occurring within the last 10 years: No If all of the above answers are "NO", then may proceed with Cephalosporin use.  . Tramadol Anaphylaxis  . Tramadol-Acetaminophen Other (See Comments)    Small vessel heart attack Other reaction(s): Cardiovascular Arrest (ALLERGY/intolerance), Other (See Comments)   . Acetaminophen Other (See Comments)    Alters insulin pump readings   . Amitriptyline Other (See Comments)    Severe headache/ out of body feeling  . Codeine Other (See Comments)    Severe headaches/ out of body feeling   . Gluten Meal Diarrhea and Nausea And Vomiting    Cramping   . Ibuprofen  Other (See Comments)    Messes up CGM reading on glucose monitor Other reaction(s): Other, Other (See Comments)   . Other Swelling and Other (See Comments)    Cats itching Mold sinuses Cat hair and scratches cause swelling Nuts - Pt stated, "I think I am allergic to [facility] Nuts"   . Propoxyphene Other (See Comments)    Severe headaches / out of body feeling   . Shellfish Allergy Diarrhea and Nausea And Vomiting  . Statins Other (See Comments)    Muscle pains Other reaction(s): Other  . Sulfamethoxazole Other (See Comments)    Mouth ulcers   . Wheat Bran Diarrhea and Nausea And Vomiting    Cramping   . Ciprofloxacin   . Levofloxacin   . Pravastatin Sodium   . Rosuvastatin Calcium   . Simvastatin   . Sulfa Antibiotics Other (See Comments)    Mouth ulcers  . Sulfites Other (See Comments)    Mouth ulcers  . Trichophyton Itching and Other (See Comments)    sinusitis   :  Family History  Problem Relation Age of Onset  . Breast cancer Mother   . Heart disease Father   . Hypertension Father   . Breast cancer Sister   :  Social History   Socioeconomic History  . Marital status: Married    Spouse name: Not on file  . Number of children: 0  . Years of education: college  . Highest education level: Not on file  Occupational History  . Occupation: Retired  Scientific laboratory technician  . Financial resource strain: Not on file  . Food insecurity    Worry: Not on file    Inability: Not on file  . Transportation needs    Medical: Not on file    Non-medical: Not on file  Tobacco Use  . Smoking status:  Former Smoker    Types: Cigarettes  . Smokeless tobacco: Never Used  . Tobacco comment:  " Quit smoking cigarettes in 20's "  Substance and Sexual Activity  . Alcohol use: Yes    Comment: occasional beer or wine  . Drug use: No  . Sexual activity: Not on file  Lifestyle  . Physical activity    Days per week: Not on file    Minutes per session: Not on file  . Stress: Not on file   Relationships  . Social Herbalist on phone: Not on file    Gets together: Not on file    Attends religious service: Not on file    Active member of club or organization: Not on file    Attends meetings of clubs or organizations: Not on file    Relationship status: Not on file  . Intimate partner violence    Fear of current or ex partner: Not on file    Emotionally abused: Not on file    Physically abused: Not on file    Forced sexual activity: Not on file  Other Topics Concern  . Not on file  Social History Narrative   Drinks 2-4 caffeien drinks a day   :  Review of Systems  Constitutional: Positive for malaise/fatigue.  HENT: Positive for sinus pain.   Eyes: Negative.   Respiratory: Negative.   Cardiovascular: Negative.   Gastrointestinal: Negative.   Genitourinary: Negative.   Musculoskeletal: Positive for joint pain and myalgias.  Skin: Negative.   Neurological: Negative.   Endo/Heme/Allergies: Negative.   Psychiatric/Behavioral: Negative.      Exam:   Petite white female in no obvious distress.  Vital signs show a temperature of 97.5.  Pulse 81.  Blood pressure 151/69.  Weight is 95 pounds.  Head neck exam shows a normocephalic atraumatic skull.  She has no scleral icterus.  She has no adenopathy in the neck.  She has no palpable thyroid.  Lungs are clear bilaterally.  Cardiac exam regular rate and rhythm with no murmurs, rubs or bruits.  Abdomen is soft.  She has good bowel sounds.  There is no fluid wave.  There is no palpable liver or spleen tip.  Axillary exam shows no bilateral axillary adenopathy.  Back exam shows no tenderness over the spine, ribs or hips.  Extremities shows no clubbing, cyanosis or edema.  Neurological exam shows no focal neurological deficits.  Skin exam shows no rashes, ecchymoses or petechia.   @IPVITALS @   Recent Labs    10/22/18 1332  WBC 7.8  HGB 10.1*  HCT 31.2*  PLT 337   Recent Labs    10/22/18 1332  NA 139  K 4.4   CL 103  CO2 29  GLUCOSE 171*  BUN 25*  CREATININE 1.10*  CALCIUM 9.8    Blood smear review: Normochromic and normocytic population of red blood cells.  I see no nucleated red blood cells.  She has no schistocytes or spherocytes.  I see no rouleaux formation.  White blood cells appear normal in morphology and maturation.  There are no hypersegmented polys.  She has no atypical lymphocytes.  Platelets are adequate number and size.  Platelets are well granulated.  Pathology: None    Assessment and Plan: Brittney Pace is a very nice 63 year old white female.  She has multiple, multiple medical problems.  She has anemia which is unsure multifactorial.  She has a history of renal insufficiency.  She has diabetes that  is longstanding.  I am sure that she has a low erythropoietin level.  It would not surprise me if there is some iron deficiency.  I am not sure exactly what this past TIA issue is.  She is not had a MRI of the brain for a while.  I cannot find anything that is neurologic with respect to her exam.  We will Pace ahead and try to help her quality of life with Aranesp.  I think that we can use Retacrit.  I told her that she has to understand for well that the use of ESA could certainly increase her risk of a neurological event.  It is well-known that blood pressure elevation from ESA could have a negative impact on the cerebrovascular circulation.  She understands this very well.  She just wants to feel better.  She is convinced that using ESA will make her feel better.  We will try the lowest dose of Retacrit.  I would like to see her back in about 3 weeks.  I spent about 45 minutes with Brittney Pace today.  She is very nice.  She is very interesting to talk to.  Again she has a ton of medical problems.  It is hard to say how much of all this will have an impact on her care.

## 2018-10-22 NOTE — Telephone Encounter (Signed)
Pt states she was seen in the office today and received a injection in her heel, that she thinks was a cortisone, and she is diabetic.

## 2018-10-22 NOTE — Progress Notes (Signed)
Okay to give today per Otilio Carpen, Financial Advocate.

## 2018-10-22 NOTE — Telephone Encounter (Signed)
I informed pt that she did receive a cortisone and Dr. Paulla Dolly stated it may temporarily increase her blood sugar but probably not and but to monitor her glucose. Pt states she will.

## 2018-10-22 NOTE — Progress Notes (Signed)
Subjective:   Patient ID: Brittney Pace, female   DOB: 63 y.o.   MRN: [id number]   HPI Patient presents with discomfort in the plantar aspect of the left heel stating it is been hurting her for at least a month an ingrown toenail deformity left hallux lateral border.  States she is have long-term diabetes but is under good control and she does keep her A1c around 7.  Patient does not smoke likes to be active   Review of Systems  All other systems reviewed and are negative.       Objective:  Physical Exam Vitals signs and nursing note reviewed.  Constitutional:      Appearance: She is well-developed.  Pulmonary:     Effort: Pulmonary effort is normal.  Musculoskeletal: Normal range of motion.  Skin:    General: Skin is warm.  Neurological:     Mental Status: She is alert.     Neurovascular status was found to be intact with muscle strength adequate.  Range of motion was found to be adequate with no indications of advanced neuropathic issues with patient found to have incurvated lateral border left hallux that is painful when pressed and inflammation pain of the plantar heel left at the insertional point of the tendon into the calcaneus.  Patient is found to have good digital perfusion well oriented x3     Assessment:  Ingrown toenail deformity left hallux lateral border with pain with no indications of infection but appears to be chronic nail disease and acute plantar fasciitis left     Plan:  H&P reviewed both conditions and for the nail I did go ahead and sterile prep and carefully anesthetized 60 mg like Marcaine mixture and after patient signed consent form understanding risk I remove the lateral border exposed matrix applied phenol 3 applications 30 seconds followed by alcohol lavage and sterile dressing.  For the heel I did sterile prep and injected the plantar fascia 3 mg Kenalog 5 mg Xylocaine and applied fascial brace to lift up the arch.  Patient will be seen back 2  weeks or earlier if needed and was encouraged to call us with any questions concerns which may arise  X-ray was negative for signs of spur formation heel no indication to stress fracture

## 2018-10-23 ENCOUNTER — Telehealth: Payer: Self-pay | Admitting: *Deleted

## 2018-10-23 LAB — HEMOGLOBINOPATHY EVALUATION
Hgb A2 Quant: 1.9 % (ref 1.8–3.2)
Hgb A: 98.1 % (ref 96.4–98.8)
Hgb C: 0 %
Hgb F Quant: 0 % (ref 0.0–2.0)
Hgb S Quant: 0 %
Hgb Variant: 0 %

## 2018-10-23 LAB — ERYTHROPOIETIN: Erythropoietin: 5.1 m[IU]/mL (ref 2.6–18.5)

## 2018-10-23 LAB — IRON AND TIBC
Iron: 81 ug/dL (ref 41–142)
Saturation Ratios: 32 % (ref 21–57)
TIBC: 254 ug/dL (ref 236–444)
UIBC: 173 ug/dL (ref 120–384)

## 2018-10-23 LAB — FERRITIN: Ferritin: 116 ng/mL (ref 11–307)

## 2018-10-23 NOTE — Telephone Encounter (Signed)
-----   Message from [name], MD sent at 10/23/2018 10:47 AM EDT ----- Call -  The iron level is ok!!  Brittney Pace

## 2018-10-23 NOTE — Telephone Encounter (Signed)
Notified pt of results. Pt verbalized understanding.

## 2018-10-24 ENCOUNTER — Ambulatory Visit: Payer: Medicare Other | Admitting: Family Medicine

## 2018-10-25 ENCOUNTER — Other Ambulatory Visit: Payer: Self-pay

## 2018-10-25 ENCOUNTER — Ambulatory Visit (INDEPENDENT_AMBULATORY_CARE_PROVIDER_SITE_OTHER): Payer: 59

## 2018-10-25 ENCOUNTER — Ambulatory Visit (INDEPENDENT_AMBULATORY_CARE_PROVIDER_SITE_OTHER): Payer: 59 | Admitting: Family Medicine

## 2018-10-25 ENCOUNTER — Encounter: Payer: Self-pay | Admitting: Family Medicine

## 2018-10-25 VITALS — BP 157/66 | HR 74 | Wt 96.0 lb

## 2018-10-25 DIAGNOSIS — M25532 Pain in left wrist: Secondary | ICD-10-CM

## 2018-10-25 DIAGNOSIS — I2583 Coronary atherosclerosis due to lipid rich plaque: Secondary | ICD-10-CM

## 2018-10-25 DIAGNOSIS — I2510 Atherosclerotic heart disease of native coronary artery without angina pectoris: Secondary | ICD-10-CM

## 2018-10-25 MED ORDER — DICLOFENAC SODIUM 1 % TD GEL: 2.0000 g | Freq: Four times a day (QID) | TRANSDERMAL | 11 refills | Status: DC

## 2018-10-25 NOTE — Patient Instructions (Signed)
Thank you for coming in today. Take it easy with the wrist.  I will send xray results when radiology has them [facility] to use wrist brace as needed.  Recheck as needed.    Contusion A contusion is a deep bruise. This is a result of an injury that causes bleeding under the skin. Symptoms of bruising include pain, swelling, and discolored skin. The skin may turn blue, purple, or yellow. Follow these instructions at home: Managing pain, stiffness, and swelling You may use RICE. This stands for:  Resting.  Icing.  Compression, or putting pressure.  Elevating, or raising the injured area. To follow this method, do these actions:  Rest the injured area.  If told, put ice on the injured area. ? Put ice in a plastic bag. ? Place a towel between your skin and the bag. ? Leave the ice on for 20 minutes, 2-3 times per day.  If told, put light pressure (compression) on the injured area using an elastic bandage. Make sure the bandage is not too tight. If the area tingles or becomes numb, remove it and put it back on as told by your doctor.  If possible, raise (elevate) the injured area above the level of your heart while you are sitting or lying down.  General instructions  Take over-the-counter and prescription medicines only as told by your doctor.  Keep all follow-up visits as told by your doctor. This is important. Contact a doctor if:  Your symptoms do not get better after several days of treatment.  Your symptoms get worse.  You have trouble moving the injured area. Get help right away if:  You have very bad pain.  You have a loss of feeling (numbness) in a hand or foot.  Your hand or foot turns pale or cold. Summary  A contusion is a deep bruise. This is a result of an injury that causes bleeding under the skin.  Symptoms of bruising include pain, swelling, and discolored skin. The skin may turn blue, purple, or yellow.  This condition is treated with rest, ice,  compression, and elevation. This is also called RICE. You may be given over-the-counter medicines for pain.  Contact a doctor if you do not feel better, or you feel worse. Get help right away if you have very bad pain, have lost feeling in a hand or foot, or the area turns pale or cold. This information is not intended to replace advice given to you by your health care provider. Make sure you discuss any questions you have with your health care provider. Document Released: 06/29/2007 Document Revised: 09/01/2017 Document Reviewed: 09/01/2017 Elsevier Patient Education  2020 [employer].

## 2018-10-25 NOTE — Progress Notes (Signed)
Subjective:    CC: left wrist pain  HPI: Patient states that on Monday while she was receiving injections at the podiatrist, her fibromyalgia was causing her pain and she began to hit her left wrist against the arm of chair while her hand was in a closed fist. Afterwards her wrist started to swell and she had significant pain. The pain is located on the ulnar side of the wrist and is associated with numbness in the 5th digit.  She states the pain is similar to when she fractured her wrist years ago but is less severe and more tolerable. The pain and swelling has improved since Monday. She has not tried any medications, heat, or ice for the pain.    Past medical history, Surgical history, Family history not pertinant except as noted below, Social history, Allergies, and medications have been entered into the medical record, reviewed, and no changes needed.   Review of Systems: as above  Objective:    Vitals:   10/25/18 1600  BP: (!) 157/66  Pulse: 74   General: Well Developed, well nourished, and in no acute distress.  Neuro/Psych: Alert and oriented x3, extra-ocular muscles intact, able to move all 4 extremities, sensation grossly intact. Skin: Warm and dry, no rashes noted.  Respiratory: Not using accessory muscles, speaking in full sentences, trachea midline.  Cardiovascular: Pulses palpable, no extremity edema. Abdomen: Does not appear distended. MSK:  Left wrist: No obvious deformity or swelling.  Mildly tender to palpation on the ulnar side of the wrist. Intact strength of interosseus muscles of fingers. Normal sensation to light touch in the 5th digit.   Lab and Radiology Results X-ray images left wrist obtained today personally and independently reviewed. No fractures visible.  Degenerative changes present in the wrist. Await formal radiology review  Impression and Recommendations:    Assessment and Plan: 63 y.o. female with left wrist pain.  Fracture not visible  on x-ray per my interpretation however radiology overread is still pending.  Plan for wrist brace and diclofenac gel and relative rest.  If not improving recheck for him clinic in near future.    PDMP not reviewed this encounter. Orders Placed This Encounter  Procedures  . DG Wrist Complete Left    Standing Status:   Future    Number of Occurrences:   1    Standing Expiration Date:   12/25/2019    Order Specific Question:   Reason for Exam (SYMPTOM  OR DIAGNOSIS REQUIRED)    Answer:   ulnar wrist pain. Add carpal tunnel view    Order Specific Question:   Preferred imaging location?    Answer:   [facility]    Order Specific Question:   Radiology Contrast Protocol - do NOT remove file path    Answer:   [email or web address]   Meds ordered this encounter  Medications  . diclofenac sodium (VOLTAREN) 1 % GEL    Sig: Apply 2 g topically 4 (four) times daily. To affected joint.    Dispense:  100 g    Refill:  11    Discussed warning signs or symptoms. Please see discharge instructions. Patient expresses understanding.  I personally was present and performed or re-performed the history, physical exam and medical decision-making activities of this service and have verified that the service and findings are accurately documented in the student's note. ___________________________________________ Lynne Leader M.D., ABFM., CAQSM. Primary Care and Sports Medicine Adjunct Instructor of [employer] [employer]

## 2018-10-30 ENCOUNTER — Encounter: Payer: Self-pay | Admitting: Osteopathic Medicine

## 2018-10-30 DIAGNOSIS — J349 Unspecified disorder of nose and nasal sinuses: Secondary | ICD-10-CM

## 2018-11-05 ENCOUNTER — Encounter: Payer: Self-pay | Admitting: Podiatry

## 2018-11-05 ENCOUNTER — Ambulatory Visit (INDEPENDENT_AMBULATORY_CARE_PROVIDER_SITE_OTHER): Payer: 59 | Admitting: Podiatry

## 2018-11-05 ENCOUNTER — Other Ambulatory Visit: Payer: Self-pay

## 2018-11-05 DIAGNOSIS — I2510 Atherosclerotic heart disease of native coronary artery without angina pectoris: Secondary | ICD-10-CM

## 2018-11-05 DIAGNOSIS — L600 Ingrowing nail: Secondary | ICD-10-CM

## 2018-11-05 DIAGNOSIS — I2583 Coronary atherosclerosis due to lipid rich plaque: Secondary | ICD-10-CM

## 2018-11-05 DIAGNOSIS — M722 Plantar fascial fibromatosis: Secondary | ICD-10-CM | POA: Diagnosis not present

## 2018-11-07 NOTE — Progress Notes (Signed)
Subjective:   Patient ID: Brittney Pace, female   DOB: 63 y.o.   MRN: [id number]   HPI Patient states she is quite a bit improved with pain that has receded and states the ingrown is healing well   ROS      Objective:  Physical Exam  Neurovascular status intact muscle strength found to be adequate with pain in the foot which has improved by about 70% with discomfort only upon deep palpation ingrown toenail that is healed well     Assessment:  Doing well post fasciitis symptoms ingrown toenail removal     Plan:  H&P reviewed condition and discussed continued treatment options consisting of stretching exercises anti-inflammatories physical therapy and modifications of shoes.  Patient will be seen back as needed

## 2018-11-12 ENCOUNTER — Other Ambulatory Visit: Payer: 59

## 2018-11-12 ENCOUNTER — Ambulatory Visit: Payer: 59

## 2018-11-12 ENCOUNTER — Ambulatory Visit: Payer: 59 | Admitting: Family

## 2018-11-12 ENCOUNTER — Telehealth: Payer: Self-pay

## 2018-11-12 NOTE — Telephone Encounter (Signed)
Needs urgent care or ER visit to evaluate is septic especially if fever

## 2018-11-12 NOTE — Telephone Encounter (Signed)
Patient states she woke up in the middle of the night with severe UTI SX. Patient experiencing extreme burning with urination and extreme urgency. Patient having blood in urine and states that she is running low grade fever. Pain in lower back, bilaterally.  Has appt with urologist on 11/20/18, wants to know if her appt should be changed to urgent or if she can have urine sample collected at lab downstairs in the interim.

## 2018-11-13 ENCOUNTER — Encounter: Payer: Self-pay | Admitting: Osteopathic Medicine

## 2018-11-13 NOTE — Telephone Encounter (Signed)
Patient advised of recommendations.  

## 2018-11-14 ENCOUNTER — Inpatient Hospital Stay: Payer: 59 | Attending: Hematology & Oncology

## 2018-11-14 ENCOUNTER — Inpatient Hospital Stay: Payer: 59

## 2018-11-14 ENCOUNTER — Inpatient Hospital Stay: Payer: 59 | Admitting: Family

## 2018-11-14 ENCOUNTER — Encounter: Payer: Self-pay | Admitting: Family

## 2018-11-14 ENCOUNTER — Inpatient Hospital Stay (HOSPITAL_BASED_OUTPATIENT_CLINIC_OR_DEPARTMENT_OTHER): Payer: 59 | Admitting: Family

## 2018-11-14 ENCOUNTER — Emergency Department (INDEPENDENT_AMBULATORY_CARE_PROVIDER_SITE_OTHER)
Admission: EM | Admit: 2018-11-14 | Discharge: 2018-11-14 | Disposition: A | Discharge status: Home / Self Care | Payer: 59 | Source: Home / Self Care

## 2018-11-14 ENCOUNTER — Encounter: Payer: Self-pay | Admitting: Emergency Medicine

## 2018-11-14 ENCOUNTER — Other Ambulatory Visit: Payer: Self-pay

## 2018-11-14 VITALS — BP 189/75 | HR 67 | Temp 97.7°F | Resp 18 | Wt 96.5 lb

## 2018-11-14 DIAGNOSIS — D631 Anemia in chronic kidney disease: Secondary | ICD-10-CM

## 2018-11-14 DIAGNOSIS — I2583 Coronary atherosclerosis due to lipid rich plaque: Secondary | ICD-10-CM

## 2018-11-14 DIAGNOSIS — D638 Anemia in other chronic diseases classified elsewhere: Secondary | ICD-10-CM

## 2018-11-14 DIAGNOSIS — N183 Chronic kidney disease, stage 3 unspecified: Secondary | ICD-10-CM

## 2018-11-14 DIAGNOSIS — D508 Other iron deficiency anemias: Secondary | ICD-10-CM | POA: Diagnosis not present

## 2018-11-14 DIAGNOSIS — N3001 Acute cystitis with hematuria: Secondary | ICD-10-CM | POA: Diagnosis not present

## 2018-11-14 DIAGNOSIS — N1831 Chronic kidney disease, stage 3a: Secondary | ICD-10-CM

## 2018-11-14 DIAGNOSIS — I2510 Atherosclerotic heart disease of native coronary artery without angina pectoris: Secondary | ICD-10-CM

## 2018-11-14 DIAGNOSIS — N189 Chronic kidney disease, unspecified: Secondary | ICD-10-CM | POA: Insufficient documentation

## 2018-11-14 DIAGNOSIS — Z794 Long term (current) use of insulin: Secondary | ICD-10-CM | POA: Insufficient documentation

## 2018-11-14 DIAGNOSIS — E1022 Type 1 diabetes mellitus with diabetic chronic kidney disease: Secondary | ICD-10-CM

## 2018-11-14 DIAGNOSIS — E119 Type 2 diabetes mellitus without complications: Secondary | ICD-10-CM | POA: Diagnosis not present

## 2018-11-14 LAB — POCT URINALYSIS DIP (MANUAL ENTRY)
Bilirubin, UA: NEGATIVE
Glucose, UA: NEGATIVE mg/dL
Ketones, POC UA: NEGATIVE mg/dL
Nitrite, UA: NEGATIVE
Protein Ur, POC: >=300 mg/dL — AB
Spec Grav, UA: 1.02 (ref 1.010–1.025)
Urobilinogen, UA: 0.2 E.U./dL
pH, UA: 6.5 (ref 5.0–8.0)

## 2018-11-14 LAB — RETICULOCYTES
Immature Retic Fract: 2.5 % (ref 2.3–15.9)
RBC.: 3.57 MIL/uL — ABNORMAL LOW (ref 3.87–5.11)
Retic Count, Absolute: 38.6 10*3/uL (ref 19.0–186.0)
Retic Ct Pct: 1.1 % (ref 0.4–3.1)

## 2018-11-14 LAB — CBC WITH DIFFERENTIAL (CANCER CENTER ONLY)
Abs Immature Granulocytes: 0.07 10*3/uL (ref 0.00–0.07)
Basophils Absolute: 0.1 10*3/uL (ref 0.0–0.1)
Basophils Relative: 1 %
Eosinophils Absolute: 0.2 10*3/uL (ref 0.0–0.5)
Eosinophils Relative: 2 %
HCT: 32.5 % — ABNORMAL LOW (ref 36.0–46.0)
Hemoglobin: 10.3 g/dL — ABNORMAL LOW (ref 12.0–15.0)
Immature Granulocytes: 1 %
Lymphocytes Relative: 18 %
Lymphs Abs: 1.6 10*3/uL (ref 0.7–4.0)
MCH: 28.9 pg (ref 26.0–34.0)
MCHC: 31.7 g/dL (ref 30.0–36.0)
MCV: 91 fL (ref 80.0–100.0)
Monocytes Absolute: 0.6 10*3/uL (ref 0.1–1.0)
Monocytes Relative: 6 %
Neutro Abs: 6.7 10*3/uL (ref 1.7–7.7)
Neutrophils Relative %: 72 %
Platelet Count: 319 10*3/uL (ref 150–400)
RBC: 3.57 MIL/uL — ABNORMAL LOW (ref 3.87–5.11)
RDW: 15.1 % (ref 11.5–15.5)
WBC Count: 9.2 10*3/uL (ref 4.0–10.5)
nRBC: 0 % (ref 0.0–0.2)

## 2018-11-14 LAB — CMP (CANCER CENTER ONLY)
ALT: 15 U/L (ref 0–44)
AST: 22 U/L (ref 15–41)
Albumin: 4.1 g/dL (ref 3.5–5.0)
Alkaline Phosphatase: 78 U/L (ref 38–126)
Anion gap: 9 (ref 5–15)
BUN: 15 mg/dL (ref 8–23)
CO2: 25 mmol/L (ref 22–32)
Calcium: 9.4 mg/dL (ref 8.9–10.3)
Chloride: 103 mmol/L (ref 98–111)
Creatinine: 1.14 mg/dL — ABNORMAL HIGH (ref 0.44–1.00)
GFR, Est AFR Am: 59 mL/min — ABNORMAL LOW (ref >60)
GFR, Estimated: 51 mL/min — ABNORMAL LOW (ref >60)
Glucose, Bld: 233 mg/dL — ABNORMAL HIGH (ref 70–99)
Potassium: 4 mmol/L (ref 3.5–5.1)
Sodium: 137 mmol/L (ref 135–145)
Total Bilirubin: 0.4 mg/dL (ref 0.3–1.2)
Total Protein: 6.5 g/dL (ref 6.5–8.1)

## 2018-11-14 MED ORDER — CEPHALEXIN 500 MG PO CAPS: 500.0000 mg | ORAL_CAPSULE | Freq: Two times a day (BID) | ORAL | 0 refills | Status: DC

## 2018-11-14 MED ORDER — EPOETIN ALFA-EPBX 10000 UNIT/ML IJ SOLN
10000.0000 [IU] | Freq: Once | INTRAMUSCULAR | Status: AC
  Administered 2018-11-14: 10000 [IU] via SUBCUTANEOUS
  Filled 2018-11-14: qty 1

## 2018-11-14 NOTE — ED Provider Notes (Signed)
[facility]    CSN: [id number] Arrival date & time: 11/14/18  0941      History   Chief Complaint Chief Complaint  Patient presents with  . Dysuria  . Urinary Frequency  . Urinary Incontinence    HPI Brittney Pace is a 63 y.o. female.   HPI Brittney Pace is a 63 y.o. female presenting to UC with c/o 4.5 days of worsening urinary urgency, frequency and pain.  She has a hx of pyelonephritis and is concerned she has another UTI. She took a home Azo test, which was Positive. Pt believes symptoms started due to being on a long car ride this past weekend. She has a new patient appointment net week with urology but cannot wait until then due to current symptoms. Denies fever, chills, n/v/d. Hx of DM1 and CKD stage 3.  Past Medical History:  Diagnosis Date  . Anemia   . Arthritis   . 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 Chronic kidney disease    stage 3  . Coronary artery disease   . Depression   . Diabetes mellitus without complication (HCC)    Type 1  . Diabetic retinopathy ([place])   . 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 Family history of adverse reaction to anesthesia    mother: " while she was under she stopped breathing."  . Fibromyalgia   . Gastroparesis   . GERD (gastroesophageal reflux disease)   . Headache    migraines  . Hypothyroidism   . IBS (irritable bowel syndrome)   . Idiopathic edema   . Lyme disease   . Mitral valve prolapse   . Myocardial infarction ([place])    1 major in 1999 and 2 minor " small vessel disease."  . Osteoporosis   . Peripheral neuropathy   . Peripheral vascular disease ([place])   . 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 Stroke [facility])    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" " the second stroke was a lacunar     Patient Active Problem List   Diagnosis Date Noted  . Dizziness 10/22/2018  . Ear fullness, bilateral 10/22/2018  . Tinnitus, right 10/22/2018  . Erythropoietin  deficiency anemia 10/22/2018  . Arthritis 02/23/2018  . Asthma 02/23/2018  . Carpal tunnel syndrome 02/23/2018  . Gastroesophageal reflux disease 02/23/2018  . Kidney disease 02/23/2018  . Seasonal affective disorder ([place]) 02/23/2018  . Electrolyte abnormality 02/20/2017  . Sepsis secondary to UTI ([place]) 02/15/2017  . Sepsis with metabolic encephalopathy ([place]) 02/15/2017  . Hypertension associated with diabetes ([place]) 02/14/2017  . Encephalopathy acute 12/23/2016  . Brain atrophy ([place]) 05/24/2016  . Chronic headache 05/24/2016  . Postmenopausal osteoporosis 05/03/2016  . Neuropathy 04/20/2016  . Lacunar stroke ([place]) 01/20/2016  . Memory loss 01/20/2016  . Other fatigue 01/20/2016  . CAD (coronary artery disease) of artery bypass graft 01/20/2016  . Depression 01/20/2016  . Right lateral epicondylitis 12/16/2015  . 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 Lyme disease 12/16/2015  . Small intestinal bacterial overgrowth 11/19/2015  . Hyperlipidemia 09/25/2015  . Abdominal bloating 09/04/2015  . Closed displaced intra-articular fracture of right calcaneus 06/16/2015  . Myopia of both eyes with astigmatism and presbyopia 05/21/2015  . Vitreous syneresis of both eyes 05/21/2015  . Combined form of age-related cataract, left eye 05/21/2015  . 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 Pseudophakia, right eye 05/21/2015  . Surgery, elective 05/06/2015  . CAD (coronary  artery disease) 11/19/2014  . Diabetes mellitus type I ([place]) 11/19/2014  . DDD (degenerative disc disease), cervical 03/11/2014  . Cervical spondylosis with radiculopathy 03/11/2014  . Spondylolisthesis of cervical region 03/11/2014  . Vitreous hemorrhage of left eye ([place]) 03/03/2014  . DDD (degenerative disc disease), thoracic 01/07/2014  . Anemia due to stage 3 chronic kidney disease 12/16/2013  . Osteoporosis 11/14/2013  . Chronic diastolic heart failure ([place]) 09/18/2013  . Cataract due to secondary diabetes  ([place]) 08/26/2013  . 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 associated with type 2 diabetes mellitus ([place]) 08/26/2013  . Secondary diabetes mellitus with ophthalmic complication ([place]) 18/84/1660  . Type 1 diabetes mellitus with hyperosmolar coma ([place]) 08/26/2013  . Hypothyroidism due to acquired atrophy of thyroid 08/02/2013  . Acquired hypothyroidism 08/02/2013  . Cerebral artery occlusion with cerebral infarction ([place]) 07/14/2013  . History of diabetic gastroparesis 04/09/2013  . Irritable bowel syndrome with diarrhea 04/09/2013  . Diarrhea 04/09/2013  . History of endocrine, metabolic or immunity disorder 04/09/2013  . Anemia of chronic disease 04/08/2013  . 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 ([place]) 63/01/6008  . CKD stage 3 due to type 1 diabetes mellitus ([place]) 04/08/2013  . Anemia 04/08/2013    Past Surgical History:  Procedure Laterality Date  . ABDOMINAL HYSTERECTOMY    . APPENDECTOMY    . BREAST SURGERY     B/L biopsy and lumpectomy   . CARDIAC CATHETERIZATION    . CARPAL TUNNEL RELEASE    . CATARACT EXTRACTION W/ INTRAOCULAR LENS IMPLANT     right eye  . COLONOSCOPY W/ BIOPSIES AND POLYPECTOMY    . CORONARY ARTERY BYPASS GRAFT    . coronary artery stents     at [facility] and [place]  . ECTOPIC PREGNANCY SURGERY    . NASAL SEPTUM SURGERY    . OPEN REDUCTION INTERNAL FIXATION (ORIF) DISTAL RADIAL FRACTURE Right 05/06/2015   Procedure: OPEN REDUCTION INTERNAL FIXATION (ORIF) RIGHT DISTAL RADIAL FRACTURE AND REPAIRS AS NEEDED;  Surgeon: Iran Planas, MD;  Location: [place];  Service: Orthopedics;  Laterality: Right;  . ORIF WRIST FRACTURE Left 11/05/2014  . ORIF WRIST FRACTURE Left 11/05/2014   Procedure: OPEN REDUCTION INTERNAL FIXATION (ORIF) LEFT WRIST FRACTURE AND REPAIR AS INDICATED;  Surgeon: Iran Planas, MD;  Location: [place];  Service: Orthopedics;  Laterality: Left;  . TRIGGER FINGER RELEASE      OB History   No obstetric history  on file.      Home Medications    Prior to Admission medications   Medication Sig Start Date End Date Taking? Authorizing Provider  arginine 500 MG tablet Take 500 mg by mouth as directed.     [provider]  Armodafinil 250 MG tablet Take 0.5 tablets (125 mg total) by mouth daily. 09/14/18   Emeterio Reeve, DO  aspirin 325 MG EC tablet Take 325 mg by mouth daily.    [provider]  carvedilol (COREG) 3.125 MG tablet TAKE 1 TABLET TWICE DAILY  WITH MEALS. 09/03/18   Emeterio Reeve, DO  cephALEXin (KEFLEX) 500 MG capsule Take 1 capsule (500 mg total) by mouth 2 (two) times daily. 11/14/18   Noe Gens, PA-C  Cholecalciferol (VITAMIN D3) 5000 UNITS TABS Take 1,000 Units by mouth daily.     [provider]  ciclopirox (PENLAC) 8 % solution Apply topically at bedtime. Apply over nail and surrounding skin. Apply daily over previous coat. After seven (7) days, may remove with alcohol  and continue cycle. 10/05/18   Emeterio Reeve, DO  cyclobenzaprine (FLEXERIL) 10 MG tablet Take 0.5-1 tablets (5-10 mg total) by mouth 3 (three) times daily as needed for muscle spasms. Caution: can cause drowsiness 06/08/17   Emeterio Reeve, DO  diclofenac sodium (VOLTAREN) 1 % GEL Apply 2 g topically 4 (four) times daily. To affected joint. 10/25/18   Gregor Hams, MD  diphenhydrAMINE HCl (BENADRYL ALLERGY PO) Take by mouth.    [provider]  ELDERBERRY PO Take by mouth.    [provider]  Esomeprazole Magnesium (NEXIUM PO) Take 20 mg by mouth every morning.     [provider]  fluocinonide (LIDEX) 0.05 % external solution APPLY SOLUTION TOPICALLY ONCE DAILY AS NEEDED TO SCALP 01/20/18   [provider]  folic acid (FOLVITE) 1 MG tablet Take by mouth.    [provider]  Insulin Human (INSULIN PUMP) SOLN Inject into the skin continuous. Novolog insulin    [provider]  KRILL OIL PO Take by mouth.    [provider]  Linoleic Acid Conjugated 1000 MG CAPS Take 1,000 mg by mouth daily.     [provider]  MELATONIN PO Take by mouth.    [provider]  nitroGLYCERIN (NITROLINGUAL) 0.4 MG/SPRAY spray Place 1 spray under the tongue every 5 (five) minutes x 3 doses as needed for chest pain. 01/02/15   Lelon Perla, MD  NON FORMULARY Chloroella    [provider]  OVER THE COUNTER MEDICATION Take 750 mg by mouth at bedtime. GABA    [provider]  OVER THE COUNTER MEDICATION as directed. Magnesium L-Treonate    [provider]  OVER THE COUNTER MEDICATION as directed. Omega EPA/DHA    [provider]  potassium chloride (KLOR-CON M10) 10 MEQ tablet Take 1 tablet (10 mEq total) by mouth 2 (two) times daily. No more refills until appt is scheduled 09/14/18   Emeterio Reeve, DO  thyroid [facility] THYROID) 60 MG tablet Take 1 tablet (60 mg total) by mouth daily before breakfast. 09/27/18   Emeterio Reeve, DO  torsemide ([name]) 20 MG tablet TAKE 1 TABLET ONCE DAILY INTHE MORNING. MAY TAKE AN   EXTRA 1/2 TABLET AS        NEEDED 01/15/18   Trixie Dredge, PA-C    Family History Family History  Problem Relation Age of Onset  . Breast cancer Mother   . Heart disease Father   . Hypertension Father   . Breast cancer Sister     Social History Social History   Tobacco Use  . Smoking status: Former Smoker    Types: Cigarettes  . Smokeless tobacco: Never Used  . Tobacco comment:  " Quit smoking cigarettes in 20's "  Substance Use Topics  . Alcohol use: Yes    Comment: occasional beer or wine  . Drug use: No     Allergies   Dilaudid [hydromorphone], Morphine, Morphine and related, Penicillins, Tramadol, Tramadol-acetaminophen, Acetaminophen, Amitriptyline, Codeine, Gluten meal, Ibuprofen, Other, Propoxyphene, Shellfish allergy, Statins, Sulfamethoxazole, Wheat bran, Ciprofloxacin, Levofloxacin, Pravastatin sodium,  Rosuvastatin calcium, Simvastatin, Sulfa antibiotics, Sulfites, and Trichophyton   Review of Systems Review of Systems  Constitutional: Negative for chills and fever.  Gastrointestinal: Negative for abdominal pain, diarrhea, nausea and vomiting.  Genitourinary: Positive for dysuria, frequency, hematuria and urgency. Negative for decreased urine volume, flank pain and pelvic pain.  Musculoskeletal: Negative for back pain and myalgias.  Neurological: Negative for dizziness, light-headedness and  headaches.     Physical Exam Triage Vital Signs ED Triage Vitals  Enc Vitals Group     BP 11/14/18 1029 (!) 191/92     Pulse Rate 11/14/18 1029 72     Resp 11/14/18 1029 18     Temp --      Temp src --      SpO2 11/14/18 1029 100 %     Weight 11/14/18 1010 92 lb (41.7 kg)     Height 11/14/18 1010 4' 11.75" (1.518 m)     Head Circumference --      Peak Flow --      Pain Score 11/14/18 1009 6     Pain Loc --      Pain Edu? --      Excl. in [place]? --    No data found.  Updated Vital Signs BP (!) 191/92 (BP Location: Right Arm)   Pulse 72   Temp (!) 97.4 F (36.3 C) (Oral)   Resp 18   Ht 4' 11.75" (1.518 m)   Wt 92 lb (41.7 kg)   SpO2 100%   BMI 18.12 kg/m   Visual Acuity Right Eye Distance:   Left Eye Distance:   Bilateral Distance:    Right Eye Near:   Left Eye Near:    Bilateral Near:     Physical Exam Vitals signs and nursing note reviewed.  Constitutional:      General: She is not in acute distress.    Appearance: Normal appearance. She is well-developed. She is not ill-appearing, toxic-appearing or diaphoretic.  HENT:     Head: Normocephalic and atraumatic.     Mouth/Throat:     Mouth: Mucous membranes are moist.  Neck:     Musculoskeletal: Normal range of motion.  Cardiovascular:     Rate and Rhythm: Normal rate and regular rhythm.  Pulmonary:     Effort: Pulmonary effort is normal. No respiratory distress.     Breath sounds: Normal breath sounds.   Abdominal:     General: There is no distension.     Palpations: Abdomen is soft.     Tenderness: There is no abdominal tenderness. There is no right CVA tenderness or left CVA tenderness.  Musculoskeletal: Normal range of motion.  Skin:    General: Skin is warm and dry.  Neurological:     Mental Status: She is alert and oriented to person, place, and time.  Psychiatric:        Behavior: Behavior normal.      UC Treatments / Results  Labs (all labs ordered are listed, but only abnormal results are displayed) Labs Reviewed  POCT URINALYSIS DIP (MANUAL ENTRY) - Abnormal; Notable for the following components:      Result Value   Clarity, UA turbid (*)    Blood, UA large (*)    Protein Ur, POC >=300 (*)    Leukocytes, UA Moderate (2+) (*)    All other components within normal limits  URINE CULTURE    EKG   Radiology No results found.  Procedures Procedures (including critical care time)  Medications Ordered in UC Medications - No data to display  Initial Impression / Assessment and Plan / UC Course  I have reviewed the triage vital signs and the nursing notes.  Pertinent labs & imaging results that were available during my care of the patient were reviewed by me and considered in my medical decision making (see chart for details).     UA c/w UTI  Culture sent No evidence of pyelonephritis at this time, however, given patient's extensive PMH, encouraged pt to seek immediate treatment of UTI symptoms even if out of town. Pt has PCN noted as an allergy. Pt states it was about 20 years ago she did have hives but is uncertain if she had trouble breathing and was not sure if her rash was from PCN or strawberries that day. Per medical records, pt was given IV Rocephin at [facility] when she was admitted on 02/15/2017 for urinary infection. No reactions then.    AVS provided.  Final Clinical Impressions(s) / UC Diagnoses   Final diagnoses:  Acute cystitis with hematuria      Discharge Instructions      Please take your antibiotic as prescribed. A urine culture has been sent to check the severity of your urinary infection and to determine if you are on the most appropriate antibiotic. The results should come back within 2-3 days and you will be notified if a medication change is needed.  Please stay well hydrated and follow up with your family doctor later this week or early next week if not improving.  Call 911 or go to the hospital if symptoms significantly worsening.      ED Prescriptions    Medication Sig Dispense Auth. Provider   cephALEXin (KEFLEX) 500 MG capsule Take 1 capsule (500 mg total) by mouth 2 (two) times daily. 14 capsule Noe Gens, [place]     PDMP not reviewed this encounter.   Noe Gens, PA-C 11/14/18 1144

## 2018-11-14 NOTE — ED Notes (Signed)
Patient's BG per her own device/pump is 88.

## 2018-11-14 NOTE — Progress Notes (Signed)
Hematology and Oncology Follow Up Visit  Brittney Pace [id number] 02-02-1955 63 y.o. 11/14/2018   Principle Diagnosis:  Anemia of erythropoietin deficiency - chronic kidney disease  Insulin-dependent diabetes History of TIAs  Current Therapy:   Reticrit 10,000 units SQ to maintain Hgb > 11    Interim History:  Brittney Pace is here today for follow-up. She is doing well but has had some fatigue.  Hgb is 10.3, 91.0 and platelets 319.  She has mild SOB at times with over exertion and will take a break to rest when needed.  No fever, chills, n/v, cough, rash, dizziness, chest pain, palpitations, abdominal pain or changes in bowel or bladder habits.  She has gastroparesis and takes psyllium to help keep her BM's regular.  She has had issues with UTI's and leakage. She will sometimes note blood in her urine. She states that she has a new patient appointment with a urologist next week for further work up.  She has not noticed any other blood loss. No bruising or petechiae.  No swelling or tenderness in her extremities at this time. She will sometimes have muscle "burning" and cramps and will drink more water.  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  No falls or syncope.  She has maintained a good appetite and her weight is stable.   ECOG Performance Status: 1 - Symptomatic but completely ambulatory  Medications:  Allergies as of 11/14/2018      Reactions   Dilaudid [hydromorphone] Other (See Comments)   Because of continuous glucose monitor   Morphine Anaphylaxis, Shortness Of Breath   Other reaction(s): Respiratory Distress (ALLERGY/intolerance)   Morphine And Related Shortness Of Breath   Penicillins Hives   Has patient had a PCN reaction causing immediate rash, facial/tongue/throat swelling, SOB or lightheadedness with hypotension: Yes Has patient had a PCN reaction causing severe rash involving mucus membranes or skin necrosis: No Has patient had a PCN reaction  that required hospitalization No Has patient had a PCN reaction occurring within the last 10 years: No If all of the above answers are "NO", then may proceed with Cephalosporin use.   Tramadol Anaphylaxis   Tramadol-acetaminophen Other (See Comments)   Small vessel heart attack Other reaction(s): Cardiovascular Arrest (ALLERGY/intolerance), Other (See Comments)   Acetaminophen Other (See Comments)   Alters insulin pump readings   Amitriptyline Other (See Comments)   Severe headache/ out of body feeling   Codeine Other (See Comments)   Severe headaches/ out of body feeling   Gluten Meal Diarrhea, Nausea And Vomiting   Cramping   Ibuprofen Other (See Comments)   Messes up CGM reading on glucose monitor Other reaction(s): Other, Other (See Comments)   Other Swelling, Other (See Comments)   Cats itching Mold sinuses Cat hair and scratches cause swelling Nuts - Pt stated, "I think I am allergic to [facility] Nuts"   Propoxyphene Other (See Comments)   Severe headaches / out of body feeling   Shellfish Allergy Diarrhea, Nausea And Vomiting   Statins Other (See Comments)   Muscle pains Other reaction(s): Other   Sulfamethoxazole Other (See Comments)   Mouth ulcers   Wheat Bran Diarrhea, Nausea And Vomiting   Cramping   Ciprofloxacin    Levofloxacin    Pravastatin Sodium    Rosuvastatin Calcium    Simvastatin    Sulfa Antibiotics Other (See Comments)   Mouth ulcers   Sulfites Other (See Comments)   Mouth ulcers   Trichophyton Itching, Other (See Comments)  sinusitis      Medication List       Accurate as of November 14, 2018 12:10 PM. If you have any questions, ask your nurse or doctor.        arginine 500 MG tablet Take 500 mg by mouth as directed.   Armodafinil 250 MG tablet Take 0.5 tablets (125 mg total) by mouth daily.   aspirin 325 MG EC tablet Take 325 mg by mouth daily.   BENADRYL ALLERGY PO Take by mouth.   carvedilol 3.125 MG tablet Commonly known as:  COREG TAKE 1 TABLET TWICE DAILY  WITH MEALS.   cephALEXin 500 MG capsule Commonly known as: KEFLEX Take 1 capsule (500 mg total) by mouth 2 (two) times daily.   ciclopirox 8 % solution Commonly known as: PENLAC Apply topically at bedtime. Apply over nail and surrounding skin. Apply daily over previous coat. After seven (7) days, may remove with alcohol and continue cycle.   cyclobenzaprine 10 MG tablet Commonly known as: FLEXERIL Take 0.5-1 tablets (5-10 mg total) by mouth 3 (three) times daily as needed for muscle spasms. Caution: can cause drowsiness   diclofenac sodium 1 % Gel Commonly known as: VOLTAREN Apply 2 g topically 4 (four) times daily. To affected joint.   ELDERBERRY PO Take by mouth.   fluocinonide 0.05 % external solution Commonly known as: LIDEX APPLY SOLUTION TOPICALLY ONCE DAILY AS NEEDED TO SCALP   folic acid 1 MG tablet Commonly known as: FOLVITE Take by mouth.   insulin pump Soln Inject into the skin continuous. Novolog insulin   KRILL OIL PO Take by mouth.   Linoleic Acid Conjugated 1000 MG Caps Take 1,000 mg by mouth daily.   MELATONIN PO Take by mouth.   NEXIUM PO Take 20 mg by mouth every morning.   nitroGLYCERIN 0.4 MG/SPRAY spray Commonly known as: NITROLINGUAL Place 1 spray under the tongue every 5 (five) minutes x 3 doses as needed for chest pain.   NON FORMULARY Chloroella   OVER THE COUNTER MEDICATION Take 750 mg by mouth at bedtime. GABA   OVER THE COUNTER MEDICATION as directed. Magnesium L-Treonate   OVER THE COUNTER MEDICATION as directed. Omega EPA/DHA   potassium chloride 10 MEQ tablet Commonly known as: Klor-Con M10 Take 1 tablet (10 mEq total) by mouth 2 (two) times daily. No more refills until appt is scheduled   thyroid 60 MG tablet Commonly known as: Armour Thyroid Take 1 tablet (60 mg total) by mouth daily before breakfast.   torsemide 20 MG tablet Commonly known as: [name] TAKE 1 TABLET ONCE DAILY  INTHE MORNING. MAY TAKE AN   EXTRA 1/2 TABLET AS        NEEDED   Vitamin D3 125 MCG (5000 UT) Tabs Take 1,000 Units by mouth daily.       Allergies:  Allergies  Allergen Reactions  . Dilaudid [Hydromorphone] Other (See Comments)    Because of continuous glucose monitor  . Morphine Anaphylaxis and Shortness Of Breath    Other reaction(s): Respiratory Distress (ALLERGY/intolerance)  . Morphine And Related Shortness Of Breath  . Penicillins Hives    Has patient had a PCN reaction causing immediate rash, facial/tongue/throat swelling, SOB or lightheadedness with hypotension: Yes Has patient had a PCN reaction causing severe rash involving mucus membranes or skin necrosis: No Has patient had a PCN reaction that required hospitalization No Has patient had a PCN reaction occurring within the last 10 years: No If all of the above answers are "  NO", then may proceed with Cephalosporin use.  . Tramadol Anaphylaxis  . Tramadol-Acetaminophen Other (See Comments)    Small vessel heart attack Other reaction(s): Cardiovascular Arrest (ALLERGY/intolerance), Other (See Comments)   . Acetaminophen Other (See Comments)    Alters insulin pump readings   . Amitriptyline Other (See Comments)    Severe headache/ out of body feeling  . Codeine Other (See Comments)    Severe headaches/ out of body feeling   . Gluten Meal Diarrhea and Nausea And Vomiting    Cramping   . Ibuprofen Other (See Comments)    Messes up CGM reading on glucose monitor Other reaction(s): Other, Other (See Comments)   . Other Swelling and Other (See Comments)    Cats itching Mold sinuses Cat hair and scratches cause swelling Nuts - Pt stated, "I think I am allergic to [facility] Nuts"   . Propoxyphene Other (See Comments)    Severe headaches / out of body feeling   . Shellfish Allergy Diarrhea and Nausea And Vomiting  . Statins Other (See Comments)    Muscle pains Other reaction(s): Other  . Sulfamethoxazole Other (See  Comments)    Mouth ulcers   . Wheat Bran Diarrhea and Nausea And Vomiting    Cramping   . Ciprofloxacin   . Levofloxacin   . Pravastatin Sodium   . Rosuvastatin Calcium   . Simvastatin   . Sulfa Antibiotics Other (See Comments)    Mouth ulcers  . Sulfites Other (See Comments)    Mouth ulcers  . Trichophyton Itching and Other (See Comments)    sinusitis     Past Medical History, Surgical history, Social history, and Family History were reviewed and updated.  Review of Systems: All other 10 point review of systems is negative.   Physical Exam:  weight is 96 lb 8 oz (43.8 kg). Her temporal temperature is 97.7 F (36.5 C). Her blood pressure is 189/75 (abnormal) and her pulse is 67. Her respiration is 18 and oxygen saturation is 100%.   Wt Readings from Last 3 Encounters:  11/14/18 96 lb 8 oz (43.8 kg)  11/14/18 92 lb (41.7 kg)  10/25/18 96 lb (43.5 kg)    Ocular: Sclerae unicteric, pupils equal, round and reactive to light Ear-nose-throat: Oropharynx clear, dentition fair Lymphatic: No cervical or supraclavicular adenopathy Lungs no rales or rhonchi, good excursion bilaterally Heart regular rate and rhythm, no murmur appreciated Abd soft, nontender, positive bowel sounds, no liver or spleen tip palpated on exam, no fluid wave  MSK no focal spinal tenderness, no joint edema Neuro: non-focal, well-oriented, appropriate affect Breasts: Deferred   Lab Results  Component Value Date   WBC 9.2 11/14/2018   HGB 10.3 (L) 11/14/2018   HCT 32.5 (L) 11/14/2018   MCV 91.0 11/14/2018   PLT 319 11/14/2018   Lab Results  Component Value Date   FERRITIN 116 10/22/2018   IRON 81 10/22/2018   TIBC 254 10/22/2018   UIBC 173 10/22/2018   IRONPCTSAT 32 10/22/2018   Lab Results  Component Value Date   RETICCTPCT 1.1 11/14/2018   RBC 3.57 (L) 11/14/2018   RBC 3.57 (L) 11/14/2018   No results found for: KPAFRELGTCHN, LAMBDASER, KAPLAMBRATIO No results found for: IGGSERUM, IGA,  IGMSERUM No results found for: Ronnald Ramp, A1GS, A2GS, Violet Baldy, MSPIKE, SPEI   Chemistry      Component Value Date/Time   NA 137 11/14/2018 1125   NA 138 07/17/2017 1128   K 4.0 11/14/2018 1125  CL 103 11/14/2018 1125   CO2 25 11/14/2018 1125   BUN 15 11/14/2018 1125   BUN 23 07/17/2017 1128   CREATININE 1.14 (H) 11/14/2018 1125   CREATININE 0.99 07/20/2018 0821      Component Value Date/Time   CALCIUM 9.4 11/14/2018 1125   ALKPHOS 78 11/14/2018 1125   AST 22 11/14/2018 1125   ALT 15 11/14/2018 1125   BILITOT 0.4 11/14/2018 1125       Impression and Plan: Ms. Demello is a very pleasant 63 yo female with multifactorial anemia.  Hgb is 10.3 so we will proceed with Reticrit.  We will plan to see her back in another 3 weeks.  She will contact our office with any questions or concerns. We can certainly see her sooner if needed.   Laverna Peace, NP 10/21/202012:10 PM

## 2018-11-14 NOTE — Patient Instructions (Signed)

## 2018-11-14 NOTE — ED Triage Notes (Signed)
Patient here for urinary discomfort; hx of UTIs; recent long car trip; has been hospitalized twice for pyelonephritis. Symptoms for past 4 days and does use depends. Has appt with urologist next week to establish care but could not be seen before that. No OTC; did use AZO test kit which was positive.

## 2018-11-14 NOTE — Discharge Instructions (Signed)
°  Please take your antibiotic as prescribed. A urine culture has been sent to check the severity of your urinary infection and to determine if you are on the most appropriate antibiotic. The results should come back within 2-3 days and you will be notified if a medication change is needed.  Please stay well hydrated and follow up with your family doctor later this week or early next week if not improving.  Call 911 or go to the hospital if symptoms significantly worsening.

## 2018-11-15 LAB — IRON AND TIBC
Iron: 93 ug/dL (ref 41–142)
Saturation Ratios: 36 % (ref 21–57)
TIBC: 258 ug/dL (ref 236–444)
UIBC: 165 ug/dL (ref 120–384)

## 2018-11-15 LAB — FERRITIN: Ferritin: 76 ng/mL (ref 11–307)

## 2018-11-16 ENCOUNTER — Telehealth (HOSPITAL_COMMUNITY): Payer: Self-pay | Admitting: Emergency Medicine

## 2018-11-16 LAB — URINE CULTURE
MICRO NUMBER:: 1014042
SPECIMEN QUALITY:: ADEQUATE

## 2018-11-16 NOTE — Telephone Encounter (Signed)
Urine culture was positive for Klebsiella pneumoniae and was given keflex  at urgent care visit. Pt contacted and made aware, educated on completing antibiotic and to follow up if symptoms are persistent. Verbalized understanding.

## 2018-11-20 DIAGNOSIS — N3281 Overactive bladder: Secondary | ICD-10-CM | POA: Insufficient documentation

## 2018-11-20 DIAGNOSIS — N390 Urinary tract infection, site not specified: Secondary | ICD-10-CM | POA: Insufficient documentation

## 2018-11-20 DIAGNOSIS — N398 Other specified disorders of urinary system: Secondary | ICD-10-CM | POA: Insufficient documentation

## 2018-11-20 DIAGNOSIS — N952 Postmenopausal atrophic vaginitis: Secondary | ICD-10-CM | POA: Insufficient documentation

## 2018-11-21 ENCOUNTER — Telehealth: Payer: Self-pay

## 2018-11-21 MED ORDER — PRALUENT 150 MG/ML ~~LOC~~ SOAJ: 150.0000 mg | SUBCUTANEOUS | 11 refills | Status: DC

## 2018-11-21 NOTE — Telephone Encounter (Signed)
Called and lmomed the pt regarding the praluent 150 being approved. Instructed tha pt to call us back w/any questions.

## 2018-11-22 ENCOUNTER — Ambulatory Visit (INDEPENDENT_AMBULATORY_CARE_PROVIDER_SITE_OTHER): Payer: 59 | Admitting: Psychology

## 2018-11-22 ENCOUNTER — Other Ambulatory Visit: Payer: Self-pay | Admitting: Osteopathic Medicine

## 2018-11-22 DIAGNOSIS — F39 Unspecified mood [affective] disorder: Secondary | ICD-10-CM | POA: Diagnosis not present

## 2018-11-22 NOTE — Telephone Encounter (Signed)
Requested medication (s) are due for refill today: yes  Requested medication (s) are on the active medication list: yes  Last refill:  05/16/2018  Future visit scheduled: no  Notes to clinic:  Refill cannot be delegated    Requested Prescriptions  Pending Prescriptions Disp Refills   Armodafinil 250 MG tablet [Pharmacy Med Name: Armodafinil 250 MG Oral Tablet] 30 tablet 0    Sig: TAKE 1/2 TO 1 (ONE-HALF TO ONE) TABLET BY MOUTH ONCE DAILY IN THE MORNING     Not Delegated - Psychiatry:  Stimulants/ADHD Failed - 11/22/2018 10:40 AM      Failed - This refill cannot be delegated      Failed - Urine Drug Screen completed in last 360 days.      Failed - Valid encounter within last 3 months    Recent Outpatient Visits          4 weeks ago Left wrist pain   [facility], Rebekah [place], MD   1 month ago Left foot pain   [facility], Lanelle Bal, DO   4 months ago Chronic fatigue   [facility], Lanelle Bal, DO   9 months ago Chronic fatigue   [facility], Lanelle Bal, DO   1 year ago Chronic fatigue   [facility], [place], DO

## 2018-11-23 NOTE — Telephone Encounter (Signed)
She is a bit early for refill, if early refill is requested again will have a discussion with the patient based on review of PDMP

## 2018-11-28 ENCOUNTER — Encounter: Payer: Self-pay | Admitting: Osteopathic Medicine

## 2018-11-28 ENCOUNTER — Other Ambulatory Visit: Payer: Self-pay | Admitting: Osteopathic Medicine

## 2018-11-30 NOTE — Progress Notes (Unsigned)
I have scheduled pt for a 40 minute Dox Appt with Dr.Alexander as Dr.Alexander requested. Spoke with patient and she is aware

## 2018-11-30 NOTE — Telephone Encounter (Signed)
-----   Message from Brittney Reeve, DO sent at 11/29/2018  9:37 AM EST ----- Can we schedule patient for 40-minute virtual visit, can be by phone or by Doximity?

## 2018-12-05 ENCOUNTER — Other Ambulatory Visit: Payer: Medicare Other

## 2018-12-05 ENCOUNTER — Other Ambulatory Visit: Payer: 59

## 2018-12-05 ENCOUNTER — Ambulatory Visit: Payer: 59 | Admitting: Hematology & Oncology

## 2018-12-05 ENCOUNTER — Ambulatory Visit: Payer: 59

## 2018-12-05 ENCOUNTER — Ambulatory Visit: Payer: 59 | Admitting: Family

## 2018-12-10 ENCOUNTER — Encounter: Payer: Self-pay | Admitting: Osteopathic Medicine

## 2018-12-10 ENCOUNTER — Other Ambulatory Visit: Payer: Self-pay

## 2018-12-10 ENCOUNTER — Ambulatory Visit (INDEPENDENT_AMBULATORY_CARE_PROVIDER_SITE_OTHER): Payer: 59 | Admitting: Osteopathic Medicine

## 2018-12-10 VITALS — BP 105/67 | HR 73 | Temp 97.5°F | Wt 96.0 lb

## 2018-12-10 DIAGNOSIS — I5032 Chronic diastolic (congestive) heart failure: Secondary | ICD-10-CM

## 2018-12-10 DIAGNOSIS — M797 Fibromyalgia: Secondary | ICD-10-CM

## 2018-12-10 DIAGNOSIS — I2583 Coronary atherosclerosis due to lipid rich plaque: Secondary | ICD-10-CM

## 2018-12-10 DIAGNOSIS — E878 Other disorders of electrolyte and fluid balance, not elsewhere classified: Secondary | ICD-10-CM | POA: Diagnosis not present

## 2018-12-10 DIAGNOSIS — R252 Cramp and spasm: Secondary | ICD-10-CM | POA: Diagnosis not present

## 2018-12-10 DIAGNOSIS — I878 Other specified disorders of veins: Secondary | ICD-10-CM

## 2018-12-10 DIAGNOSIS — I2510 Atherosclerotic heart disease of native coronary artery without angina pectoris: Secondary | ICD-10-CM

## 2018-12-10 DIAGNOSIS — Z66 Do not resuscitate: Secondary | ICD-10-CM | POA: Insufficient documentation

## 2018-12-10 MED ORDER — THYROID 60 MG PO TABS: 60.0000 mg | ORAL_TABLET | Freq: Every day | ORAL | 3 refills | Status: DC

## 2018-12-10 MED ORDER — CYCLOBENZAPRINE HCL 10 MG PO TABS: 5.0000 mg | ORAL_TABLET | Freq: Three times a day (TID) | ORAL | 1 refills | Status: DC | PRN

## 2018-12-10 MED ORDER — POTASSIUM CHLORIDE CRYS ER 10 MEQ PO TBCR: 10.0000 meq | EXTENDED_RELEASE_TABLET | Freq: Two times a day (BID) | ORAL | 3 refills | Status: DC

## 2018-12-10 NOTE — Progress Notes (Signed)
Virtual Visit via Video (App used: Doximity) Note  I connected with      Brittney Pace on 12/10/18 at 2:40 by a telemedicine application and verified that I am speaking with the correct person using two identifiers.  Patient is at home I am in office   I discussed the limitations of evaluation and management by telemedicine and the availability of in person appointments. The patient expressed understanding and agreed to proceed.  History of Present Illness: Brittney Pace is a 63 y.o. female who would like to discuss Fibromyalgia    Request for port placement, d/t hard stick.  Patient states that getting blood work done is excruciating for her.    Fibromyalgia: pain everywhere.  Seems to be getting a bit worse. Previous fibromyalgia treatments include... TCA: Amitriptyline No  , As I do not know  SSRI/SNRI: Duloxetine in the past, Prozac in the past, currently declines SSRI Muscle Relaxer: Cyclobenzaprine Yes  but intermittent Anticonvulsants: Gabapentin Yes , Pregabalin No  NSAID uses voltaren gel which helps some physical activity Very limited  Leg cramps Potassium helps, would like to get a refill on this medication.  Magnesium has really improved her energy levels     Observations/Objective: BP 105/67 (Patient Position: Sitting, Cuff Size: Normal)   Pulse 73   Temp (!) 97.5 F (36.4 C) (Oral)   Wt 96 lb (43.5 kg)   BMI 18.91 kg/m  BP Readings from Last 3 Encounters:  12/10/18 105/67  11/14/18 (!) 189/75  11/14/18 (!) 191/92   Exam: Normal Speech.  NAD  Lab and Radiology Results No results found for this or any previous visit (from the past 72 hour(s)). No results found.     Assessment and Plan: 63 y.o. female with The primary encounter diagnosis was Fibromyalgia. Diagnoses of Chronic diastolic heart failure ([place]), Leg cramps, Electrolyte abnormality, DNR (do not resuscitate), and Poor venous access were also pertinent to this  visit.  Clarified CODE STATUS as well, patient wishes to be DNR, I had signed DNR order for her in the past with the yellow official sheet, I do not see this scanned, will ask patient to bring this next time she is here.  Orders Placed This Encounter  Procedures  . Ambulatory referral to Vascular Surgery    Meds ordered this encounter  Medications  . cyclobenzaprine (FLEXERIL) 10 MG tablet    Sig: Take 0.5-1 tablets (5-10 mg total) by mouth 3 (three) times daily as needed for muscle spasms. Caution: can cause drowsiness    Dispense:  60 tablet    Refill:  1  . potassium chloride (KLOR-CON M10) 10 MEQ tablet    Sig: Take 1 tablet (10 mEq total) by mouth 2 (two) times daily. No more refills until appt is scheduled    Dispense:  180 tablet    Refill:  3  . thyroid (ARMOUR THYROID) 60 MG tablet    Sig: Take 1 tablet (60 mg total) by mouth daily before breakfast.    Dispense:  90 tablet    Refill:  3      Follow Up Instructions: Return if symptoms worsen or fail to improve.    I discussed the assessment and treatment plan with the patient. The patient was provided an opportunity to ask questions and all were answered. The patient agreed with the plan and demonstrated an understanding of the instructions.   The patient was advised to call back or seek an in-person evaluation if any new concerns, if  symptoms worsen or if the condition fails to improve as anticipated.  25 minutes of non-face-to-face time was provided during this encounter.      . . . . . . . . . . . . . Marland Kitchen                   Historical information moved to improve visibility of documentation.  Past Medical History:  Diagnosis Date  . Anemia   . Arthritis   . 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 Chronic kidney disease    stage 3  . Coronary artery disease   . Depression   . Diabetes mellitus without complication (HCC)    Type 1  . Diabetic retinopathy ([place])   .  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 Family history of adverse reaction to anesthesia    mother: " while she was under she stopped breathing."  . Fibromyalgia   . Gastroparesis   . GERD (gastroesophageal reflux disease)   . Headache    migraines  . Hypothyroidism   . IBS (irritable bowel syndrome)   . Idiopathic edema   . Lyme disease   . Mitral valve prolapse   . Myocardial infarction ([place])    1 major in 1999 and 2 minor " small vessel disease."  . Osteoporosis   . Peripheral neuropathy   . Peripheral vascular disease ([place])   . 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 ABDOMINAL HYSTERECTOMY    . APPENDECTOMY    . BREAST SURGERY     B/L biopsy and lumpectomy   . CARDIAC CATHETERIZATION    . CARPAL TUNNEL RELEASE    . CATARACT EXTRACTION W/ INTRAOCULAR LENS IMPLANT     right eye  . COLONOSCOPY W/ BIOPSIES AND POLYPECTOMY    . CORONARY ARTERY BYPASS GRAFT    . coronary artery stents     at [place] and [place]  . ECTOPIC PREGNANCY SURGERY    . NASAL SEPTUM SURGERY    . OPEN REDUCTION INTERNAL FIXATION (ORIF) DISTAL RADIAL FRACTURE Right 05/06/2015   Procedure: OPEN REDUCTION INTERNAL FIXATION (ORIF) RIGHT DISTAL RADIAL FRACTURE AND REPAIRS AS NEEDED;  Surgeon: Iran Planas, MD;  Location: [facility];  Service: Orthopedics;  Laterality: Right;  . ORIF WRIST FRACTURE Left 11/05/2014  . ORIF WRIST FRACTURE Left 11/05/2014   Procedure: OPEN REDUCTION INTERNAL FIXATION (ORIF) LEFT WRIST FRACTURE AND REPAIR AS INDICATED;  Surgeon: Iran Planas, MD;  Location: [facility];  Service: Orthopedics;  Laterality: Left;  . TRIGGER FINGER RELEASE     Social History   Tobacco Use  . Smoking status: Former Smoker    Types: Cigarettes  . Smokeless tobacco: Never Used  . Tobacco comment:  " Quit smoking cigarettes in 20's "  Substance  Use Topics  . Alcohol use: Yes    Comment: occasional beer or wine   family history includes Breast cancer in her mother and sister; Heart disease in her father; Hypertension in her father.  Medications: Current Outpatient Medications  Medication Sig Dispense Refill  . Alirocumab (PRALUENT) 150 MG/ML SOAJ Inject 150 mg into the skin every 14 (fourteen) days. 2 pen 11  . arginine 500 MG tablet Take 500 mg by mouth as directed.     Marland Kitchen  Armodafinil 250 MG tablet Take 0.5 tablets (125 mg total) by mouth daily. 45 tablet 1  . aspirin 325 MG EC tablet Take 325 mg by mouth daily.    . carvedilol (COREG) 3.125 MG tablet TAKE 1 TABLET TWICE DAILY  WITH MEALS. 180 tablet 0  . Cholecalciferol (VITAMIN D3) 5000 UNITS TABS Take 1,000 Units by mouth daily.     . ciclopirox (PENLAC) 8 % solution Apply topically at bedtime. Apply over nail and surrounding skin. Apply daily over previous coat. After seven (7) days, may remove with alcohol and continue cycle. 6.6 mL 0  . diclofenac sodium (VOLTAREN) 1 % GEL Apply 2 g topically 4 (four) times daily. To affected joint. 100 g 11  . diphenhydrAMINE HCl (BENADRYL ALLERGY PO) Take by mouth.    . ELDERBERRY PO Take by mouth.    . Esomeprazole Magnesium (NEXIUM PO) Take 20 mg by mouth every morning.     . fluocinonide (LIDEX) 0.05 % external solution APPLY SOLUTION TOPICALLY ONCE DAILY AS NEEDED TO SCALP    . folic acid (FOLVITE) 1 MG tablet Take by mouth.    . Insulin Human (INSULIN PUMP) SOLN Inject into the skin continuous. Novolog insulin    . KRILL OIL PO Take by mouth.    . Linoleic Acid Conjugated 1000 MG CAPS Take 1,000 mg by mouth daily.     Marland Kitchen MELATONIN PO Take by mouth.    . nitroGLYCERIN (NITROLINGUAL) 0.4 MG/SPRAY spray Place 1 spray under the tongue every 5 (five) minutes x 3 doses as needed for chest pain. 12 g 1  . NON FORMULARY Chloroella    . OVER THE COUNTER MEDICATION Take 750 mg by mouth at bedtime. GABA    . OVER THE COUNTER MEDICATION as  directed. Magnesium L-Treonate    . OVER THE COUNTER MEDICATION as directed. Omega EPA/DHA    . torsemide ([name]) 20 MG tablet TAKE 1 TABLET ONCE DAILY INTHE MORNING. MAY TAKE AN   EXTRA 1/2 TABLET AS        NEEDED 135 tablet 0  . cyclobenzaprine (FLEXERIL) 10 MG tablet Take 0.5-1 tablets (5-10 mg total) by mouth 3 (three) times daily as needed for muscle spasms. Caution: can cause drowsiness 60 tablet 1  . potassium chloride (KLOR-CON M10) 10 MEQ tablet Take 1 tablet (10 mEq total) by mouth 2 (two) times daily. No more refills until appt is scheduled 180 tablet 3  . thyroid (ARMOUR THYROID) 60 MG tablet Take 1 tablet (60 mg total) by mouth daily before breakfast. 90 tablet 3   No current facility-administered medications for this visit.    Allergies  Allergen Reactions  . Dilaudid [Hydromorphone] Other (See Comments)    Because of continuous glucose monitor  . Morphine Anaphylaxis and Shortness Of Breath    Other reaction(s): Respiratory Distress (ALLERGY/intolerance)  . Morphine And Related Shortness Of Breath  . Penicillins Hives    Has patient had a PCN reaction causing immediate rash, facial/tongue/throat swelling, SOB or lightheadedness with hypotension: Yes Has patient had a PCN reaction causing severe rash involving mucus membranes or skin necrosis: No Has patient had a PCN reaction that required hospitalization No Has patient had a PCN reaction occurring within the last 10 years: No If all of the above answers are "NO", then may proceed with Cephalosporin use.  . Tramadol Anaphylaxis  . Tramadol-Acetaminophen Other (See Comments)    Small vessel heart attack Other reaction(s): Cardiovascular Arrest (ALLERGY/intolerance), Other (See Comments)   . Acetaminophen  Other (See Comments)    Alters insulin pump readings   . Amitriptyline Other (See Comments)    Severe headache/ out of body feeling  . Codeine Other (See Comments)    Severe headaches/ out of body feeling   . Gluten  Meal Diarrhea and Nausea And Vomiting    Cramping   . Ibuprofen Other (See Comments)    Messes up CGM reading on glucose monitor Other reaction(s): Other, Other (See Comments)   . Losartan Cough  . Other Swelling and Other (See Comments)    Cats itching Mold sinuses Cat hair and scratches cause swelling Nuts - Pt stated, "I think I am allergic to [facility] Nuts"   . Penicillin G Rash    Hives, had to use Prednisone  . Propoxyphene Other (See Comments)    Severe headaches / out of body feeling   . Shellfish Allergy Diarrhea and Nausea And Vomiting  . Statins Other (See Comments)    Muscle pains Other reaction(s): Other  . Sulfamethoxazole Other (See Comments)    Mouth ulcers   . Wheat Bran Diarrhea and Nausea And Vomiting    Cramping   . Ciprofloxacin   . Levofloxacin   . Pravastatin Sodium   . Rosuvastatin Calcium   . Simvastatin   . Sulfa Antibiotics Other (See Comments)    Mouth ulcers  . Sulfites Other (See Comments)    Mouth ulcers  . Trichophyton Itching and Other (See Comments)    sinusitis

## 2018-12-13 ENCOUNTER — Inpatient Hospital Stay: Payer: 59

## 2018-12-13 ENCOUNTER — Other Ambulatory Visit: Payer: Self-pay

## 2018-12-13 ENCOUNTER — Inpatient Hospital Stay: Payer: 59 | Attending: Hematology & Oncology

## 2018-12-13 ENCOUNTER — Inpatient Hospital Stay (HOSPITAL_BASED_OUTPATIENT_CLINIC_OR_DEPARTMENT_OTHER): Payer: 59 | Admitting: Family

## 2018-12-13 VITALS — BP 157/71 | HR 70 | Temp 97.5°F | Resp 16 | Wt 97.0 lb

## 2018-12-13 DIAGNOSIS — D508 Other iron deficiency anemias: Secondary | ICD-10-CM

## 2018-12-13 DIAGNOSIS — D631 Anemia in chronic kidney disease: Secondary | ICD-10-CM

## 2018-12-13 DIAGNOSIS — N189 Chronic kidney disease, unspecified: Secondary | ICD-10-CM | POA: Diagnosis not present

## 2018-12-13 DIAGNOSIS — E1122 Type 2 diabetes mellitus with diabetic chronic kidney disease: Secondary | ICD-10-CM | POA: Insufficient documentation

## 2018-12-13 DIAGNOSIS — Z79899 Other long term (current) drug therapy: Secondary | ICD-10-CM | POA: Insufficient documentation

## 2018-12-13 DIAGNOSIS — N1831 Chronic kidney disease, stage 3a: Secondary | ICD-10-CM

## 2018-12-13 DIAGNOSIS — I2583 Coronary atherosclerosis due to lipid rich plaque: Secondary | ICD-10-CM

## 2018-12-13 DIAGNOSIS — N183 Chronic kidney disease, stage 3 unspecified: Secondary | ICD-10-CM

## 2018-12-13 DIAGNOSIS — I2510 Atherosclerotic heart disease of native coronary artery without angina pectoris: Secondary | ICD-10-CM

## 2018-12-13 DIAGNOSIS — D638 Anemia in other chronic diseases classified elsewhere: Secondary | ICD-10-CM

## 2018-12-13 DIAGNOSIS — E1022 Type 1 diabetes mellitus with diabetic chronic kidney disease: Secondary | ICD-10-CM

## 2018-12-13 LAB — CMP (CANCER CENTER ONLY)
ALT: 13 U/L (ref 0–44)
AST: 22 U/L (ref 15–41)
Albumin: 3.9 g/dL (ref 3.5–5.0)
Alkaline Phosphatase: 78 U/L (ref 38–126)
Anion gap: 7 (ref 5–15)
BUN: 29 mg/dL — ABNORMAL HIGH (ref 8–23)
CO2: 27 mmol/L (ref 22–32)
Calcium: 9.2 mg/dL (ref 8.9–10.3)
Chloride: 100 mmol/L (ref 98–111)
Creatinine: 1.24 mg/dL — ABNORMAL HIGH (ref 0.44–1.00)
GFR, Est AFR Am: 53 mL/min — ABNORMAL LOW (ref >60)
GFR, Estimated: 46 mL/min — ABNORMAL LOW (ref >60)
Glucose, Bld: 305 mg/dL — ABNORMAL HIGH (ref 70–99)
Potassium: 5 mmol/L (ref 3.5–5.1)
Sodium: 134 mmol/L — ABNORMAL LOW (ref 135–145)
Total Bilirubin: 0.4 mg/dL (ref 0.3–1.2)
Total Protein: 5.8 g/dL — ABNORMAL LOW (ref 6.5–8.1)

## 2018-12-13 LAB — CBC WITH DIFFERENTIAL (CANCER CENTER ONLY)
Abs Immature Granulocytes: 0.02 10*3/uL (ref 0.00–0.07)
Basophils Absolute: 0.1 10*3/uL (ref 0.0–0.1)
Basophils Relative: 1 %
Eosinophils Absolute: 0.3 10*3/uL (ref 0.0–0.5)
Eosinophils Relative: 4 %
HCT: 31.5 % — ABNORMAL LOW (ref 36.0–46.0)
Hemoglobin: 10.3 g/dL — ABNORMAL LOW (ref 12.0–15.0)
Immature Granulocytes: 0 %
Lymphocytes Relative: 27 %
Lymphs Abs: 1.7 10*3/uL (ref 0.7–4.0)
MCH: 29.3 pg (ref 26.0–34.0)
MCHC: 32.7 g/dL (ref 30.0–36.0)
MCV: 89.7 fL (ref 80.0–100.0)
Monocytes Absolute: 0.6 10*3/uL (ref 0.1–1.0)
Monocytes Relative: 9 %
Neutro Abs: 3.6 10*3/uL (ref 1.7–7.7)
Neutrophils Relative %: 59 %
Platelet Count: 329 10*3/uL (ref 150–400)
RBC: 3.51 MIL/uL — ABNORMAL LOW (ref 3.87–5.11)
RDW: 14.1 % (ref 11.5–15.5)
WBC Count: 6.1 10*3/uL (ref 4.0–10.5)
nRBC: 0 % (ref 0.0–0.2)

## 2018-12-13 LAB — RETICULOCYTES
Immature Retic Fract: 4.9 % (ref 2.3–15.9)
RBC.: 3.51 MIL/uL — ABNORMAL LOW (ref 3.87–5.11)
Retic Count, Absolute: 35.8 10*3/uL (ref 19.0–186.0)
Retic Ct Pct: 1 % (ref 0.4–3.1)

## 2018-12-13 MED ORDER — EPOETIN ALFA-EPBX 10000 UNIT/ML IJ SOLN
10000.0000 [IU] | Freq: Once | INTRAMUSCULAR | Status: AC
  Administered 2018-12-13: 10000 [IU] via SUBCUTANEOUS

## 2018-12-13 NOTE — Patient Instructions (Signed)

## 2018-12-13 NOTE — Progress Notes (Signed)
Hematology and Oncology Follow Up Visit  Brittney Pace [id number] May 08, 1955 63 y.o. 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 She states that she is feeling much better. She still has some mild fatigue at times.  No episodes of bleeding. No bruising or petechiae.  No fever, chills, n/v, cough, rash, dizziness, chest pain, palpitations, abdominal pain or changes in bowel or bladder habits.  She has occasional SOB with over exertion and will take a break to rest if needed.  No swelling, tenderness, numbness or tingling in her extremities.  No falls or syncope.  Her weight is stable. She is eating well and staying hydrated.   ECOG Performance Status: 1 - Symptomatic but completely ambulatory  Medications:  Allergies as of 12/13/2018      Reactions   Dilaudid [hydromorphone] Other (See Comments)   Because of continuous glucose monitor   Morphine Anaphylaxis, Shortness Of Breath   Other reaction(s): Respiratory Distress (ALLERGY/intolerance)   Morphine And Related Shortness Of Breath   Penicillins Hives   Has patient had a PCN reaction causing immediate rash, facial/tongue/throat swelling, SOB or lightheadedness with hypotension: Yes Has patient had a PCN reaction causing severe rash involving mucus membranes or skin necrosis: No Has patient had a PCN reaction that required hospitalization No Has patient had a PCN reaction occurring within the last 10 years: No If all of the above answers are "NO", then may proceed with Cephalosporin use.   Tramadol Anaphylaxis   Tramadol-acetaminophen Other (See Comments)   Small vessel heart attack Other reaction(s): Cardiovascular Arrest (ALLERGY/intolerance), Other (See Comments)   Acetaminophen Other (See  Comments)   Alters insulin pump readings   Amitriptyline Other (See Comments)   Severe headache/ out of body feeling   Codeine Other (See Comments)   Severe headaches/ out of body feeling   Gluten Meal Diarrhea, Nausea And Vomiting   Cramping   Ibuprofen Other (See Comments)   Messes up CGM reading on glucose monitor Other reaction(s): Other, Other (See Comments)   Losartan Cough   Other Swelling, Other (See Comments)   Cats itching Mold sinuses Cat hair and scratches cause swelling Nuts - Pt stated, "I think I am allergic to [facility] Nuts"   Penicillin G Rash   Hives, had to use Prednisone   Propoxyphene Other (See Comments)   Severe headaches / out of body feeling   Shellfish Allergy Diarrhea, Nausea And Vomiting   Statins Other (See Comments)   Muscle pains Other reaction(s): Other   Sulfamethoxazole Other (See Comments)   Mouth ulcers   Wheat Bran Diarrhea, Nausea And Vomiting   Cramping   Ciprofloxacin    Levofloxacin    Pravastatin Sodium    Rosuvastatin Calcium    Simvastatin    Sulfa Antibiotics Other (See Comments)   Mouth ulcers   Sulfites Other (See Comments)   Mouth ulcers   Trichophyton Itching, Other (See Comments)   sinusitis      Medication List       Accurate as of December 13, 2018  1:57 PM. If you have any questions, ask your nurse or doctor.        arginine 500 MG tablet Take 500 mg by mouth as directed.   Armodafinil 250 MG tablet Take 0.5 tablets (125 mg  total) by mouth daily.   aspirin 325 MG EC tablet Take 325 mg by mouth daily.   BENADRYL ALLERGY PO Take by mouth.   carvedilol 3.125 MG tablet Commonly known as: COREG TAKE 1 TABLET TWICE DAILY  WITH MEALS.   ciclopirox 8 % solution Commonly known as: PENLAC Apply topically at bedtime. Apply over nail and surrounding skin. Apply daily over previous coat. After seven (7) days, may remove with alcohol and continue cycle.   cyclobenzaprine 10 MG tablet Commonly known as: FLEXERIL  Take 0.5-1 tablets (5-10 mg total) by mouth 3 (three) times daily as needed for muscle spasms. Caution: can cause drowsiness   diclofenac sodium 1 % Gel Commonly known as: VOLTAREN Apply 2 g topically 4 (four) times daily. To affected joint.   ELDERBERRY PO Take by mouth.   fluocinonide 0.05 % external solution Commonly known as: LIDEX APPLY SOLUTION TOPICALLY ONCE DAILY AS NEEDED TO SCALP   folic acid 1 MG tablet Commonly known as: FOLVITE Take by mouth.   insulin pump Soln Inject into the skin continuous. Novolog insulin   KRILL OIL PO Take by mouth.   Linoleic Acid Conjugated 1000 MG Caps Take 1,000 mg by mouth daily.   MELATONIN PO Take by mouth.   NEXIUM PO Take 20 mg by mouth every morning.   nitroGLYCERIN 0.4 MG/SPRAY spray Commonly known as: NITROLINGUAL Place 1 spray under the tongue every 5 (five) minutes x 3 doses as needed for chest pain.   NON FORMULARY Chloroella   OVER THE COUNTER MEDICATION Take 750 mg by mouth at bedtime. GABA   OVER THE COUNTER MEDICATION as directed. Magnesium L-Treonate   OVER THE COUNTER MEDICATION as directed. Omega EPA/DHA   potassium chloride 10 MEQ tablet Commonly known as: Klor-Con M10 Take 1 tablet (10 mEq total) by mouth 2 (two) times daily. No more refills until appt is scheduled   Praluent 150 MG/ML Soaj Generic drug: Alirocumab Inject 150 mg into the skin every 14 (fourteen) days.   thyroid 60 MG tablet Commonly known as: Armour Thyroid Take 1 tablet (60 mg total) by mouth daily before breakfast.   torsemide 20 MG tablet Commonly known as: [name] TAKE 1 TABLET ONCE DAILY INTHE MORNING. MAY TAKE AN   EXTRA 1/2 TABLET AS        NEEDED   Vitamin D3 125 MCG (5000 UT) Tabs Take 1,000 Units by mouth daily.       Allergies:  Allergies  Allergen Reactions  . Dilaudid [Hydromorphone] Other (See Comments)    Because of continuous glucose monitor  . Morphine Anaphylaxis and Shortness Of Breath    Other  reaction(s): Respiratory Distress (ALLERGY/intolerance)  . Morphine And Related Shortness Of Breath  . Penicillins Hives    Has patient had a PCN reaction causing immediate rash, facial/tongue/throat swelling, SOB or lightheadedness with hypotension: Yes Has patient had a PCN reaction causing severe rash involving mucus membranes or skin necrosis: No Has patient had a PCN reaction that required hospitalization No Has patient had a PCN reaction occurring within the last 10 years: No If all of the above answers are "NO", then may proceed with Cephalosporin use.  . Tramadol Anaphylaxis  . Tramadol-Acetaminophen Other (See Comments)    Small vessel heart attack Other reaction(s): Cardiovascular Arrest (ALLERGY/intolerance), Other (See Comments)   . Acetaminophen Other (See Comments)    Alters insulin pump readings   . Amitriptyline Other (See Comments)    Severe headache/ out of body feeling  .  Codeine Other (See Comments)    Severe headaches/ out of body feeling   . Gluten Meal Diarrhea and Nausea And Vomiting    Cramping   . Ibuprofen Other (See Comments)    Messes up CGM reading on glucose monitor Other reaction(s): Other, Other (See Comments)   . Losartan Cough  . Other Swelling and Other (See Comments)    Cats itching Mold sinuses Cat hair and scratches cause swelling Nuts - Pt stated, "I think I am allergic to [facility] Nuts"   . Penicillin G Rash    Hives, had to use Prednisone  . Propoxyphene Other (See Comments)    Severe headaches / out of body feeling   . Shellfish Allergy Diarrhea and Nausea And Vomiting  . Statins Other (See Comments)    Muscle pains Other reaction(s): Other  . Sulfamethoxazole Other (See Comments)    Mouth ulcers   . Wheat Bran Diarrhea and Nausea And Vomiting    Cramping   . Ciprofloxacin   . Levofloxacin   . Pravastatin Sodium   . Rosuvastatin Calcium   . Simvastatin   . Sulfa Antibiotics Other (See Comments)    Mouth ulcers  . Sulfites  Other (See Comments)    Mouth ulcers  . Trichophyton Itching and Other (See Comments)    sinusitis     Past Medical History, Surgical history, Social history, and Family History were reviewed and updated.  Review of Systems: All other 10 point review of systems is negative.   Physical Exam:  vitals were not taken for this visit.   Wt Readings from Last 3 Encounters:  12/10/18 96 lb (43.5 kg)  11/14/18 96 lb 8 oz (43.8 kg)  11/14/18 92 lb (41.7 kg)    Ocular: Sclerae unicteric, pupils equal, round and reactive to light Ear-nose-throat: Oropharynx clear, dentition fair Lymphatic: No cervical or supraclavicular adenopathy Lungs no rales or rhonchi, good excursion bilaterally Heart regular rate and rhythm, no murmur appreciated Abd soft, nontender, positive bowel sounds, no liver or spleen tip palpated on exam, no fluid wave  MSK no focal spinal tenderness, no joint edema Neuro: non-focal, well-oriented, appropriate affect Breasts: Deferred   Lab Results  Component Value Date   WBC 6.1 12/13/2018   HGB 10.3 (L) 12/13/2018   HCT 31.5 (L) 12/13/2018   MCV 89.7 12/13/2018   PLT 329 12/13/2018   Lab Results  Component Value Date   FERRITIN 76 11/14/2018   IRON 93 11/14/2018   TIBC 258 11/14/2018   UIBC 165 11/14/2018   IRONPCTSAT 36 11/14/2018   Lab Results  Component Value Date   RETICCTPCT 1.0 12/13/2018   RBC 3.51 (L) 12/13/2018   RBC 3.51 (L) 12/13/2018   No results found for: KPAFRELGTCHN, LAMBDASER, KAPLAMBRATIO No results found for: IGGSERUM, IGA, IGMSERUM No results found for: Odetta Pink, SPEI   Chemistry      Component Value Date/Time   NA 137 11/14/2018 1125   NA 138 07/17/2017 1128   K 4.0 11/14/2018 1125   CL 103 11/14/2018 1125   CO2 25 11/14/2018 1125   BUN 15 11/14/2018 1125   BUN 23 07/17/2017 1128   CREATININE 1.14 (H) 11/14/2018 1125   CREATININE 0.99 07/20/2018 0821      Component  Value Date/Time   CALCIUM 9.4 11/14/2018 1125   ALKPHOS 78 11/14/2018 1125   AST 22 11/14/2018 1125   ALT 15 11/14/2018 1125   BILITOT 0.4 11/14/2018 1125  Impression and Plan: Brittney Pace is a very pleasant 63 yo female with multifactorial anemia.  She received Retacrit today for Hgb 10.3.  We will see what her iron studies show and bring her back in for infusion if needed.  We will plan to see her back in another month.  She will contact our office with any questions or concerns. We can certainly see her sooner if needed.   Laverna Peace, NP 11/19/20201:57 PM

## 2018-12-14 ENCOUNTER — Ambulatory Visit: Payer: 59 | Admitting: Family

## 2018-12-14 ENCOUNTER — Ambulatory Visit: Payer: 59

## 2018-12-14 ENCOUNTER — Other Ambulatory Visit: Payer: Medicare Other

## 2018-12-14 LAB — IRON AND TIBC
Iron: 80 ug/dL (ref 41–142)
Saturation Ratios: 29 % (ref 21–57)
TIBC: 279 ug/dL (ref 236–444)
UIBC: 199 ug/dL (ref 120–384)

## 2018-12-14 LAB — FERRITIN: Ferritin: 58 ng/mL (ref 11–307)

## 2018-12-19 ENCOUNTER — Telehealth: Payer: Self-pay | Admitting: Osteopathic Medicine

## 2018-12-19 DIAGNOSIS — I878 Other specified disorders of veins: Secondary | ICD-10-CM

## 2018-12-19 NOTE — Telephone Encounter (Signed)
Sent referral to [facility] in [facility]. - CF

## 2018-12-19 NOTE — Telephone Encounter (Signed)
Fixed it Jenny Reichmann, thanks!

## 2018-12-23 NOTE — Telephone Encounter (Signed)
Pt called team health with dysuria, urgency for last 2 days. She was treated for UTI at the end of October. She finished treatment and symptoms resolved. She is concerned infection is back. She declines going to UC because of increased risk for covid. She denies any fever, chills, nausea, vomiting, flank pain. She was sent macrobid to start and to follow up in clinic virtually tomorrow.

## 2018-12-24 ENCOUNTER — Ambulatory Visit (INDEPENDENT_AMBULATORY_CARE_PROVIDER_SITE_OTHER): Payer: 59 | Admitting: Psychology

## 2018-12-24 DIAGNOSIS — F331 Major depressive disorder, recurrent, moderate: Secondary | ICD-10-CM | POA: Diagnosis not present

## 2018-12-24 NOTE — Telephone Encounter (Signed)
Virtual appointment has been made. No further questions at this time.

## 2018-12-25 ENCOUNTER — Ambulatory Visit: Payer: Medicare Other | Admitting: Osteopathic Medicine

## 2018-12-25 ENCOUNTER — Ambulatory Visit (INDEPENDENT_AMBULATORY_CARE_PROVIDER_SITE_OTHER): Payer: 59 | Admitting: Osteopathic Medicine

## 2018-12-25 ENCOUNTER — Encounter: Payer: Self-pay | Admitting: Osteopathic Medicine

## 2018-12-25 VITALS — Wt 92.0 lb

## 2018-12-25 DIAGNOSIS — Z87891 Personal history of nicotine dependence: Secondary | ICD-10-CM

## 2018-12-25 DIAGNOSIS — E1059 Type 1 diabetes mellitus with other circulatory complications: Secondary | ICD-10-CM | POA: Diagnosis not present

## 2018-12-25 DIAGNOSIS — I2583 Coronary atherosclerosis due to lipid rich plaque: Secondary | ICD-10-CM | POA: Diagnosis not present

## 2018-12-25 DIAGNOSIS — I2510 Atherosclerotic heart disease of native coronary artery without angina pectoris: Secondary | ICD-10-CM | POA: Diagnosis not present

## 2018-12-25 DIAGNOSIS — N390 Urinary tract infection, site not specified: Secondary | ICD-10-CM | POA: Diagnosis not present

## 2018-12-25 DIAGNOSIS — R238 Other skin changes: Secondary | ICD-10-CM | POA: Diagnosis not present

## 2018-12-25 DIAGNOSIS — Z7982 Long term (current) use of aspirin: Secondary | ICD-10-CM

## 2018-12-25 MED ORDER — KETOCONAZOLE 2 % EX SHAM: 1.0000 "application " | MEDICATED_SHAMPOO | CUTANEOUS | 1 refills | Status: DC

## 2018-12-25 MED ORDER — CIPROFLOXACIN HCL 500 MG PO TABS: 500.0000 mg | ORAL_TABLET | Freq: Every day | ORAL | 0 refills | Status: AC

## 2018-12-25 MED ORDER — AMBULATORY NON FORMULARY MEDICATION: 99 refills | Status: DC

## 2018-12-25 NOTE — Progress Notes (Signed)
Virtual Visit via Video (App used: Doximity) Note  I connected with      Brittney Pace on 12/25/18 at 1:00 PM by a telemedicine application and verified that I am speaking with the correct person using two identifiers.  Patient is at home I am in office   I discussed the limitations of evaluation and management by telemedicine and the availability of in person appointments. The patient expressed understanding and agreed to proceed.  History of Present Illness: Brittney Pace is a 63 y.o. female who would like to discuss Urinary problem, scalp issue    11/14/18 UCx (+)Klebsiella w/ intermediate resistance to nitrofurantoin and resistant to ampicillin . Was Rx Keflex. Multiple allergies/intolerance - she states no history of reaction to Cipro but she had read that this was not a good medicine for patients with kidney problems.   Reports lots of high blood sugars over the weekend. Dysuria started 4 days ago, along with malodorous urine. She has taken some leftover macrobid.    Requests alternative shampoo for scalp flakes  Requests incontinence pads Rx in case insurance might pay for these    Observations/Objective: Wt 92 lb (41.7 kg)   BMI 18.12 kg/m  BP Readings from Last 3 Encounters:  12/13/18 (!) 157/71  12/10/18 105/67  11/14/18 (!) 189/75   Exam: Normal Speech.  NAD  Lab and Radiology Results No results found for this or any previous visit (from the past 72 hour(s)). No results found.     Assessment and Plan: 63 y.o. female with The primary encounter diagnosis was Urinary tract infection without hematuria, site unspecified. 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   Pt has already taken some macrobid, advised against this in the future to get accurate UCx, will go ahead and treat for complicated UTI Renally dosed cipro based on recent CrCl 30  PDMP not  reviewed this encounter. No orders of the defined types were placed in this encounter.  Meds ordered this encounter  Medications  . ketoconazole (NIZORAL) 2 % shampoo    Sig: Apply 1 application topically 2 (two) times a week.    Dispense:  240 mL    Refill:  1  . ciprofloxacin (CIPRO) 500 MG tablet    Sig: Take 1 tablet (500 mg total) by mouth daily for 10 days.    Dispense:  10 tablet    Refill:  0  . AMBULATORY NON FORMULARY MEDICATION    Sig: Incontinence pads per patient preference, #unlimited, refill x99 Fax to [phone]    Dispense:  1 Units    Refill:  prn      Follow Up Instructions: Return if symptoms worsen or fail to improve.    I discussed the assessment and treatment plan with the patient. The patient was provided an opportunity to ask questions and all were answered. The patient agreed with the plan and demonstrated an understanding of the instructions.   The patient was advised to call back or seek an in-person evaluation if any new concerns, if symptoms worsen or if the condition fails to improve as anticipated.  25 minutes of non-face-to-face time was provided during this encounter.      . . . . . . . . . . . . . Marland Kitchen                   Historical information moved to improve visibility of documentation.  Past Medical History:  Diagnosis Date  . Anemia   . Arthritis   . 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 Chronic kidney disease    stage 3  . Coronary artery disease   . Depression   . Diabetes mellitus without complication (HCC)    Type 1  . Diabetic retinopathy ([place])   . 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 Family history of adverse reaction to anesthesia    mother: " while she was under she stopped breathing."  . Fibromyalgia   . Gastroparesis   . GERD (gastroesophageal reflux disease)   . Headache    migraines  . Hypothyroidism   . IBS (irritable bowel syndrome)   . Idiopathic edema   .  Lyme disease   . Mitral valve prolapse   . Myocardial infarction ([place])    1 major in 1999 and 2 minor " small vessel disease."  . Osteoporosis   . Peripheral neuropathy   . Peripheral vascular disease ([place])   . 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 ABDOMINAL HYSTERECTOMY    . APPENDECTOMY    . BREAST SURGERY     B/L biopsy and lumpectomy   . CARDIAC CATHETERIZATION    . CARPAL TUNNEL RELEASE    . CATARACT EXTRACTION W/ INTRAOCULAR LENS IMPLANT     right eye  . COLONOSCOPY W/ BIOPSIES AND POLYPECTOMY    . CORONARY ARTERY BYPASS GRAFT    . coronary artery stents     at [place] and [place]  . ECTOPIC PREGNANCY SURGERY    . NASAL SEPTUM SURGERY    . OPEN REDUCTION INTERNAL FIXATION (ORIF) DISTAL RADIAL FRACTURE Right 05/06/2015   Procedure: OPEN REDUCTION INTERNAL FIXATION (ORIF) RIGHT DISTAL RADIAL FRACTURE AND REPAIRS AS NEEDED;  Surgeon: Iran Planas, MD;  Location: [facility];  Service: Orthopedics;  Laterality: Right;  . ORIF WRIST FRACTURE Left 11/05/2014  . ORIF WRIST FRACTURE Left 11/05/2014   Procedure: OPEN REDUCTION INTERNAL FIXATION (ORIF) LEFT WRIST FRACTURE AND REPAIR AS INDICATED;  Surgeon: Iran Planas, MD;  Location: [facility];  Service: Orthopedics;  Laterality: Left;  . TRIGGER FINGER RELEASE     Social History   Tobacco Use  . Smoking status: Former Smoker    Types: Cigarettes  . Smokeless tobacco: Never Used  . Tobacco comment:  " Quit smoking cigarettes in 20's "  Substance Use Topics  . Alcohol use: Yes    Comment: occasional beer or wine   family history includes Breast cancer in her mother and sister; Heart disease in her father; Hypertension in her father.  Medications: Current Outpatient Medications  Medication Sig Dispense Refill  . Alirocumab (PRALUENT) 150 MG/ML SOAJ Inject 150 mg into the skin  every 14 (fourteen) days. 2 pen 11  . arginine 500 MG tablet Take 500 mg by mouth as directed.     . Armodafinil 250 MG tablet Take 0.5 tablets (125 mg total) by mouth daily. 45 tablet 1  . aspirin 325 MG EC tablet Take 325 mg by mouth daily.    . carvedilol (COREG) 3.125 MG tablet TAKE 1 TABLET TWICE DAILY  WITH MEALS. 180 tablet 0  . Cholecalciferol (VITAMIN D3) 5000 UNITS TABS Take 1,000 Units by mouth daily.     Marland Kitchen  ciclopirox (PENLAC) 8 % solution Apply topically at bedtime. Apply over nail and surrounding skin. Apply daily over previous coat. After seven (7) days, may remove with alcohol and continue cycle. 6.6 mL 0  . cyclobenzaprine (FLEXERIL) 10 MG tablet Take 0.5-1 tablets (5-10 mg total) by mouth 3 (three) times daily as needed for muscle spasms. Caution: can cause drowsiness 60 tablet 1  . diclofenac sodium (VOLTAREN) 1 % GEL Apply 2 g topically 4 (four) times daily. To affected joint. 100 g 11  . diphenhydrAMINE HCl (BENADRYL ALLERGY PO) Take by mouth.    . ELDERBERRY PO Take by mouth.    . Esomeprazole Magnesium (NEXIUM PO) Take 20 mg by mouth every morning.     . fluocinonide (LIDEX) 0.05 % external solution APPLY SOLUTION TOPICALLY ONCE DAILY AS NEEDED TO SCALP    . folic acid (FOLVITE) 1 MG tablet Take by mouth.    . Insulin Human (INSULIN PUMP) SOLN Inject into the skin continuous. Novolog insulin    . KRILL OIL PO Take by mouth.    . Linoleic Acid Conjugated 1000 MG CAPS Take 1,000 mg by mouth daily.     Marland Kitchen MELATONIN PO Take by mouth.    . nitroGLYCERIN (NITROLINGUAL) 0.4 MG/SPRAY spray Place 1 spray under the tongue every 5 (five) minutes x 3 doses as needed for chest pain. 12 g 1  . NON FORMULARY Chloroella    . OVER THE COUNTER MEDICATION Take 750 mg by mouth at bedtime. GABA    . OVER THE COUNTER MEDICATION as directed. Magnesium L-Treonate    . OVER THE COUNTER MEDICATION as directed. Omega EPA/DHA    . potassium chloride (KLOR-CON M10) 10 MEQ tablet Take 1 tablet (10 mEq  total) by mouth 2 (two) times daily. No more refills until appt is scheduled 180 tablet 3  . thyroid (ARMOUR THYROID) 60 MG tablet Take 1 tablet (60 mg total) by mouth daily before breakfast. 90 tablet 3  . torsemide ([name]) 20 MG tablet TAKE 1 TABLET ONCE DAILY INTHE MORNING. MAY TAKE AN   EXTRA 1/2 TABLET AS        NEEDED 135 tablet 0   No current facility-administered medications for this visit.    Allergies  Allergen Reactions  . Dilaudid [Hydromorphone] Other (See Comments)    Because of continuous glucose monitor  . Morphine Anaphylaxis and Shortness Of Breath    Other reaction(s): Respiratory Distress (ALLERGY/intolerance)  . Morphine And Related Shortness Of Breath  . Penicillins Hives    Has patient had a PCN reaction causing immediate rash, facial/tongue/throat swelling, SOB or lightheadedness with hypotension: Yes Has patient had a PCN reaction causing severe rash involving mucus membranes or skin necrosis: No Has patient had a PCN reaction that required hospitalization No Has patient had a PCN reaction occurring within the last 10 years: No If all of the above answers are "NO", then may proceed with Cephalosporin use.  . Tramadol Anaphylaxis  . Tramadol-Acetaminophen Other (See Comments)    Small vessel heart attack Other reaction(s): Cardiovascular Arrest (ALLERGY/intolerance), Other (See Comments)   . Acetaminophen Other (See Comments)    Alters insulin pump readings   . Amitriptyline Other (See Comments)    Severe headache/ out of body feeling  . Codeine Other (See Comments)    Severe headaches/ out of body feeling   . Gluten Meal Diarrhea and Nausea And Vomiting    Cramping   . Ibuprofen Other (See Comments)    Messes up CGM  reading on glucose monitor Other reaction(s): Other, Other (See Comments)   . Losartan Cough  . Other Swelling and Other (See Comments)    Cats itching Mold sinuses Cat hair and scratches cause swelling Nuts - Pt stated, "I think I am  allergic to [facility] Nuts"   . Penicillin G Rash    Hives, had to use Prednisone  . Propoxyphene Other (See Comments)    Severe headaches / out of body feeling   . Shellfish Allergy Diarrhea and Nausea And Vomiting  . Statins Other (See Comments)    Muscle pains Other reaction(s): Other  . Sulfamethoxazole Other (See Comments)    Mouth ulcers   . Wheat Bran Diarrhea and Nausea And Vomiting    Cramping   . Ciprofloxacin   . Levofloxacin   . Pravastatin Sodium   . Rosuvastatin Calcium   . Simvastatin   . Sulfa Antibiotics Other (See Comments)    Mouth ulcers  . Sulfites Other (See Comments)    Mouth ulcers  . Trichophyton Itching and Other (See Comments)    sinusitis

## 2018-12-31 LAB — HM DIABETES EYE EXAM

## 2019-01-11 ENCOUNTER — Ambulatory Visit: Payer: 59

## 2019-01-11 ENCOUNTER — Other Ambulatory Visit: Payer: 59

## 2019-01-11 ENCOUNTER — Ambulatory Visit: Payer: Medicare Other | Admitting: Psychology

## 2019-01-11 ENCOUNTER — Ambulatory Visit: Payer: 59 | Admitting: Family

## 2019-01-12 ENCOUNTER — Other Ambulatory Visit: Payer: Self-pay | Admitting: Nurse Practitioner

## 2019-01-14 ENCOUNTER — Inpatient Hospital Stay (HOSPITAL_BASED_OUTPATIENT_CLINIC_OR_DEPARTMENT_OTHER): Payer: 59 | Admitting: Family

## 2019-01-14 ENCOUNTER — Inpatient Hospital Stay: Payer: 59 | Attending: Hematology & Oncology

## 2019-01-14 ENCOUNTER — Inpatient Hospital Stay: Payer: 59

## 2019-01-14 ENCOUNTER — Other Ambulatory Visit: Payer: Self-pay

## 2019-01-14 ENCOUNTER — Encounter: Payer: Self-pay | Admitting: Family

## 2019-01-14 VITALS — BP 163/62 | HR 71 | Temp 99.0°F | Resp 18 | Ht 59.0 in | Wt 93.0 lb

## 2019-01-14 DIAGNOSIS — D631 Anemia in chronic kidney disease: Secondary | ICD-10-CM | POA: Insufficient documentation

## 2019-01-14 DIAGNOSIS — Z8673 Personal history of transient ischemic attack (TIA), and cerebral infarction without residual deficits: Secondary | ICD-10-CM | POA: Diagnosis not present

## 2019-01-14 DIAGNOSIS — I2583 Coronary atherosclerosis due to lipid rich plaque: Secondary | ICD-10-CM

## 2019-01-14 DIAGNOSIS — E11649 Type 2 diabetes mellitus with hypoglycemia without coma: Secondary | ICD-10-CM | POA: Insufficient documentation

## 2019-01-14 DIAGNOSIS — D508 Other iron deficiency anemias: Secondary | ICD-10-CM

## 2019-01-14 DIAGNOSIS — E1140 Type 2 diabetes mellitus with diabetic neuropathy, unspecified: Secondary | ICD-10-CM | POA: Diagnosis not present

## 2019-01-14 DIAGNOSIS — Z794 Long term (current) use of insulin: Secondary | ICD-10-CM | POA: Diagnosis not present

## 2019-01-14 DIAGNOSIS — D638 Anemia in other chronic diseases classified elsewhere: Secondary | ICD-10-CM

## 2019-01-14 DIAGNOSIS — I2510 Atherosclerotic heart disease of native coronary artery without angina pectoris: Secondary | ICD-10-CM

## 2019-01-14 DIAGNOSIS — Z79899 Other long term (current) drug therapy: Secondary | ICD-10-CM | POA: Diagnosis not present

## 2019-01-14 DIAGNOSIS — N1831 Chronic kidney disease, stage 3a: Secondary | ICD-10-CM

## 2019-01-14 DIAGNOSIS — N189 Chronic kidney disease, unspecified: Secondary | ICD-10-CM | POA: Diagnosis not present

## 2019-01-14 DIAGNOSIS — N183 Chronic kidney disease, stage 3 unspecified: Secondary | ICD-10-CM

## 2019-01-14 DIAGNOSIS — E1022 Type 1 diabetes mellitus with diabetic chronic kidney disease: Secondary | ICD-10-CM

## 2019-01-14 DIAGNOSIS — E1122 Type 2 diabetes mellitus with diabetic chronic kidney disease: Secondary | ICD-10-CM | POA: Diagnosis present

## 2019-01-14 LAB — CBC WITH DIFFERENTIAL (CANCER CENTER ONLY)
Abs Immature Granulocytes: 0.02 10*3/uL (ref 0.00–0.07)
Basophils Absolute: 0.1 10*3/uL (ref 0.0–0.1)
Basophils Relative: 1 %
Eosinophils Absolute: 0.3 10*3/uL (ref 0.0–0.5)
Eosinophils Relative: 5 %
HCT: 30.2 % — ABNORMAL LOW (ref 36.0–46.0)
Hemoglobin: 9.7 g/dL — ABNORMAL LOW (ref 12.0–15.0)
Immature Granulocytes: 0 %
Lymphocytes Relative: 32 %
Lymphs Abs: 1.9 10*3/uL (ref 0.7–4.0)
MCH: 29.4 pg (ref 26.0–34.0)
MCHC: 32.1 g/dL (ref 30.0–36.0)
MCV: 91.5 fL (ref 80.0–100.0)
Monocytes Absolute: 0.5 10*3/uL (ref 0.1–1.0)
Monocytes Relative: 8 %
Neutro Abs: 3.1 10*3/uL (ref 1.7–7.7)
Neutrophils Relative %: 54 %
Platelet Count: 345 10*3/uL (ref 150–400)
RBC: 3.3 MIL/uL — ABNORMAL LOW (ref 3.87–5.11)
RDW: 12.2 % (ref 11.5–15.5)
WBC Count: 5.8 10*3/uL (ref 4.0–10.5)
nRBC: 0 % (ref 0.0–0.2)

## 2019-01-14 LAB — CMP (CANCER CENTER ONLY)
ALT: 17 U/L (ref 0–44)
AST: 23 U/L (ref 15–41)
Albumin: 3.8 g/dL (ref 3.5–5.0)
Alkaline Phosphatase: 70 U/L (ref 38–126)
Anion gap: 7 (ref 5–15)
BUN: 25 mg/dL — ABNORMAL HIGH (ref 8–23)
CO2: 27 mmol/L (ref 22–32)
Calcium: 8.9 mg/dL (ref 8.9–10.3)
Chloride: 100 mmol/L (ref 98–111)
Creatinine: 1.16 mg/dL — ABNORMAL HIGH (ref 0.44–1.00)
GFR, Est AFR Am: 58 mL/min — ABNORMAL LOW (ref >60)
GFR, Estimated: 50 mL/min — ABNORMAL LOW (ref >60)
Glucose, Bld: 245 mg/dL — ABNORMAL HIGH (ref 70–99)
Potassium: 3.9 mmol/L (ref 3.5–5.1)
Sodium: 134 mmol/L — ABNORMAL LOW (ref 135–145)
Total Bilirubin: 0.3 mg/dL (ref 0.3–1.2)
Total Protein: 6.2 g/dL — ABNORMAL LOW (ref 6.5–8.1)

## 2019-01-14 LAB — RETICULOCYTES
Immature Retic Fract: 4.2 % (ref 2.3–15.9)
RBC.: 3.26 MIL/uL — ABNORMAL LOW (ref 3.87–5.11)
Retic Count, Absolute: 27.7 10*3/uL (ref 19.0–186.0)
Retic Ct Pct: 0.9 % (ref 0.4–3.1)

## 2019-01-14 MED ORDER — EPOETIN ALFA-EPBX 10000 UNIT/ML IJ SOLN
10000.0000 [IU] | Freq: Once | INTRAMUSCULAR | Status: AC
  Administered 2019-01-14: 10000 [IU] via SUBCUTANEOUS

## 2019-01-14 NOTE — Progress Notes (Signed)
Hematology and Oncology Follow Up Visit  Brittney Pace [id number] May 05, 1955 63 y.o. 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 She is doing well and adjusting to her new insulin pump. This has been difficult as she has had to stop boluses to prevent hypoglycemia. She states that today has been better than the last 3 days. She has a telephone appointment later this afternoon with endocrinology for follow-up.  Hgb today is 9.7. No episodes of bleeding. No bruising or petechiae.  She denies fever, chills, n/v, cough, rash, dizziness, SOB, chest pain, palpitations, abdominal pain or changes in bowel or bladder habits.  She does have IBS and gastroparesis and takes fiber to help with her BMs.  She has occasional palpitations with hypoglycemic episodes.  The swelling in her ankles is unchanged from her normal. She does take [name] as needed and also takes KDUR BID.  The neuropathy in her fingertips and toes is unchanged.  No falls or syncopal episodes to report. She has maintained a good appetite and is staying well hydrated. Her weight is stable.   ECOG Performance Status: 1 - Symptomatic but completely ambulatory  Medications:  Allergies as of 01/14/2019      Reactions   Dilaudid [hydromorphone] Other (See Comments)   Because of continuous glucose monitor   Morphine Anaphylaxis, Shortness Of Breath   Other reaction(s): Respiratory Distress (ALLERGY/intolerance)   Morphine And Related Shortness Of Breath   Penicillins Hives   Has patient had a PCN reaction causing immediate rash, facial/tongue/throat swelling, SOB or lightheadedness with hypotension: Yes Has patient had a PCN reaction causing severe rash involving mucus membranes or skin  necrosis: No Has patient had a PCN reaction that required hospitalization No Has patient had a PCN reaction occurring within the last 10 years: No If all of the above answers are "NO", then may proceed with Cephalosporin use.   Tramadol Anaphylaxis   Tramadol-acetaminophen Other (See Comments)   Small vessel heart attack Other reaction(s): Cardiovascular Arrest (ALLERGY/intolerance), Other (See Comments)   Acetaminophen Other (See Comments)   Alters insulin pump readings   Amitriptyline Other (See Comments)   Severe headache/ out of body feeling   Codeine Other (See Comments)   Severe headaches/ out of body feeling   Gluten Meal Diarrhea, Nausea And Vomiting   Cramping   Ibuprofen Other (See Comments)   Messes up CGM reading on glucose monitor Other reaction(s): Other, Other (See Comments)   Losartan Cough   Other Swelling, Other (See Comments)   Cats itching Mold sinuses Cat hair and scratches cause swelling Nuts - Pt stated, "I think I am allergic to [facility] Nuts"   Propoxyphene Other (See Comments)   Severe headaches / out of body feeling   Shellfish Allergy Diarrhea, Nausea And Vomiting   Statins Other (See Comments)   Muscle pains Other reaction(s): Other   Sulfamethoxazole Other (See Comments)   Mouth ulcers   Trichophyton Itching, Other (See Comments)   sinusitis   Wheat Bran Diarrhea, Nausea And Vomiting   Cramping      Medication List       Accurate as of January 14, 2019  3:11 PM. If you have any questions, ask your nurse or doctor.        AMBULATORY NON FORMULARY MEDICATION Incontinence  pads per patient preference, #unlimited, refill x99 Fax to [phone]   arginine 500 MG tablet Take 500 mg by mouth as directed.   Armodafinil 250 MG tablet Take 0.5 tablets (125 mg total) by mouth daily.   aspirin 325 MG EC tablet Take 325 mg by mouth daily.   BENADRYL ALLERGY PO Take by mouth.   carvedilol 3.125 MG tablet Commonly known as: COREG TAKE 1  TABLET TWICE DAILY  WITH MEALS.   ciclopirox 8 % solution Commonly known as: PENLAC Apply topically at bedtime. Apply over nail and surrounding skin. Apply daily over previous coat. After seven (7) days, may remove with alcohol and continue cycle.   cyclobenzaprine 10 MG tablet Commonly known as: FLEXERIL Take 0.5-1 tablets (5-10 mg total) by mouth 3 (three) times daily as needed for muscle spasms. Caution: can cause drowsiness   diclofenac sodium 1 % Gel Commonly known as: VOLTAREN Apply 2 g topically 4 (four) times daily. To affected joint.   ELDERBERRY PO Take by mouth.   fluocinonide 0.05 % external solution Commonly known as: LIDEX APPLY SOLUTION TOPICALLY ONCE DAILY AS NEEDED TO SCALP   folic acid 1 MG tablet Commonly known as: FOLVITE Take by mouth.   insulin pump Soln Inject into the skin continuous. Novolog insulin   ketoconazole 2 % shampoo Commonly known as: NIZORAL Apply 1 application topically 2 (two) times a week.   KRILL OIL PO Take by mouth.   Linoleic Acid Conjugated 1000 MG Caps Take 1,000 mg by mouth daily.   MELATONIN PO Take by mouth.   NEXIUM PO Take 20 mg by mouth every morning.   nitroGLYCERIN 0.4 MG/SPRAY spray Commonly known as: NITROLINGUAL Place 1 spray under the tongue every 5 (five) minutes x 3 doses as needed for chest pain.   NON FORMULARY Chloroella   OVER THE COUNTER MEDICATION Take 750 mg by mouth at bedtime. GABA   OVER THE COUNTER MEDICATION as directed. Magnesium L-Treonate   OVER THE COUNTER MEDICATION as directed. Omega EPA/DHA   potassium chloride 10 MEQ tablet Commonly known as: Klor-Con M10 Take 1 tablet (10 mEq total) by mouth 2 (two) times daily. No more refills until appt is scheduled   Praluent 150 MG/ML Soaj Generic drug: Alirocumab Inject 150 mg into the skin every 14 (fourteen) days.   thyroid 60 MG tablet Commonly known as: Armour Thyroid Take 1 tablet (60 mg total) by mouth daily before  breakfast.   torsemide 20 MG tablet Commonly known as: [name] TAKE 1 TABLET ONCE DAILY INTHE MORNING. MAY TAKE AN   EXTRA 1/2 TABLET AS        NEEDED   Vitamin D3 125 MCG (5000 UT) Tabs Take 1,000 Units by mouth daily.       Allergies:  Allergies  Allergen Reactions  . Dilaudid [Hydromorphone] Other (See Comments)    Because of continuous glucose monitor  . Morphine Anaphylaxis and Shortness Of Breath    Other reaction(s): Respiratory Distress (ALLERGY/intolerance)  . Morphine And Related Shortness Of Breath  . Penicillins Hives    Has patient had a PCN reaction causing immediate rash, facial/tongue/throat swelling, SOB or lightheadedness with hypotension: Yes Has patient had a PCN reaction causing severe rash involving mucus membranes or skin necrosis: No Has patient had a PCN reaction that required hospitalization No Has patient had a PCN reaction occurring within the last 10 years: No If all of the above answers are "NO", then may proceed with Cephalosporin use.  . Tramadol Anaphylaxis  .  Tramadol-Acetaminophen Other (See Comments)    Small vessel heart attack Other reaction(s): Cardiovascular Arrest (ALLERGY/intolerance), Other (See Comments)   . Acetaminophen Other (See Comments)    Alters insulin pump readings   . Amitriptyline Other (See Comments)    Severe headache/ out of body feeling  . Codeine Other (See Comments)    Severe headaches/ out of body feeling   . Gluten Meal Diarrhea and Nausea And Vomiting    Cramping   . Ibuprofen Other (See Comments)    Messes up CGM reading on glucose monitor Other reaction(s): Other, Other (See Comments)   . Losartan Cough  . Other Swelling and Other (See Comments)    Cats itching Mold sinuses Cat hair and scratches cause swelling Nuts - Pt stated, "I think I am allergic to [facility] Nuts"   . Propoxyphene Other (See Comments)    Severe headaches / out of body feeling   . Shellfish Allergy Diarrhea and Nausea And  Vomiting  . Statins Other (See Comments)    Muscle pains Other reaction(s): Other  . Sulfamethoxazole Other (See Comments)    Mouth ulcers   . Trichophyton Itching and Other (See Comments)    sinusitis   . Wheat Bran Diarrhea and Nausea And Vomiting    Cramping     Past Medical History, Surgical history, Social history, and Family History were reviewed and updated.  Review of Systems: All other 10 point review of systems is negative.   Physical Exam:  vitals were not taken for this visit.   Wt Readings from Last 3 Encounters:  12/25/18 92 lb (41.7 kg)  12/13/18 97 lb 0.1 oz (44 kg)  12/10/18 96 lb (43.5 kg)     Lab Results  Component Value Date   WBC 5.8 01/14/2019   HGB 9.7 (L) 01/14/2019   HCT 30.2 (L) 01/14/2019   MCV 91.5 01/14/2019   PLT 345 01/14/2019   Lab Results  Component Value Date   FERRITIN 58 12/13/2018   IRON 80 12/13/2018   TIBC 279 12/13/2018   UIBC 199 12/13/2018   IRONPCTSAT 29 12/13/2018   Lab Results  Component Value Date   RETICCTPCT 0.9 01/14/2019   RBC 3.26 (L) 01/14/2019   No results found for: KPAFRELGTCHN, LAMBDASER, KAPLAMBRATIO No results found for: IGGSERUM, IGA, IGMSERUM No results found for: Kathrynn Ducking, MSPIKE, SPEI   Chemistry      Component Value Date/Time   NA 134 (L) 12/13/2018 1318   NA 138 07/17/2017 1128   K 5.0 12/13/2018 1318   CL 100 12/13/2018 1318   CO2 27 12/13/2018 1318   BUN 29 (H) 12/13/2018 1318   BUN 23 07/17/2017 1128   CREATININE 1.24 (H) 12/13/2018 1318   CREATININE 0.99 07/20/2018 0821      Component Value Date/Time   CALCIUM 9.2 12/13/2018 1318   ALKPHOS 78 12/13/2018 1318   AST 22 12/13/2018 1318   ALT 13 12/13/2018 1318   BILITOT 0.4 12/13/2018 1318       Impression and Plan: Ms. Blankenbeckler is a very pleasant 63 yo female with multifactorial anemia.  She received Retacrit today for Hgb 9.7.  We will see her back in another month for  follow-up.  She will contact our office with any questions or concerns. We can certainly see her sooner if needed.   Laverna Peace, NP 12/21/20203:11 PM

## 2019-01-14 NOTE — Patient Instructions (Signed)

## 2019-01-15 LAB — IRON AND TIBC
Iron: 91 ug/dL (ref 41–142)
Saturation Ratios: 33 % (ref 21–57)
TIBC: 274 ug/dL (ref 236–444)
UIBC: 183 ug/dL (ref 120–384)

## 2019-01-15 LAB — FERRITIN: Ferritin: 51 ng/mL (ref 11–307)

## 2019-02-01 ENCOUNTER — Encounter: Payer: Self-pay | Admitting: Osteopathic Medicine

## 2019-02-06 ENCOUNTER — Encounter: Payer: Self-pay | Admitting: Osteopathic Medicine

## 2019-02-06 ENCOUNTER — Ambulatory Visit (INDEPENDENT_AMBULATORY_CARE_PROVIDER_SITE_OTHER): Payer: 59 | Admitting: Psychology

## 2019-02-06 DIAGNOSIS — F331 Major depressive disorder, recurrent, moderate: Secondary | ICD-10-CM

## 2019-02-08 ENCOUNTER — Telehealth: Payer: Self-pay | Admitting: Osteopathic Medicine

## 2019-02-08 ENCOUNTER — Ambulatory Visit (INDEPENDENT_AMBULATORY_CARE_PROVIDER_SITE_OTHER): Payer: 59 | Admitting: Medical-Surgical

## 2019-02-08 ENCOUNTER — Encounter: Payer: Self-pay | Admitting: Medical-Surgical

## 2019-02-08 DIAGNOSIS — Z8673 Personal history of transient ischemic attack (TIA), and cerebral infarction without residual deficits: Secondary | ICD-10-CM

## 2019-02-08 DIAGNOSIS — G2401 Drug induced subacute dyskinesia: Secondary | ICD-10-CM | POA: Diagnosis not present

## 2019-02-08 DIAGNOSIS — H938X3 Other specified disorders of ear, bilateral: Secondary | ICD-10-CM

## 2019-02-08 DIAGNOSIS — J349 Unspecified disorder of nose and nasal sinuses: Secondary | ICD-10-CM

## 2019-02-08 NOTE — Telephone Encounter (Signed)
[facility] called and would like a referral to [facility] Dr. Carles Collet for movement disorder.  Please advise. - CF

## 2019-02-08 NOTE — Progress Notes (Addendum)
Virtual Visit via Video Note  I connected with Brittney Pace on 02/08/19 at  1:00 PM EST by a video enabled telemedicine application and verified that I am speaking with the correct person using two identifiers.   I discussed the limitations of evaluation and management by telemedicine and the availability of in person appointments. The patient expressed understanding and agreed to proceed.  Subjective:    CC: Request for referrals  HPI: Very pleasant 64 year old female presenting today to discuss referrals. 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 History of TIA/stroke managed currently by [facility] neurology.  She expresses she would like to find a new neurologist, preferably at [facility] neurology.  Would also like a referral to ENT for chronic ear pressure/sinus pressure since childhood.  Symptoms appear to be seasonal, worse in the winter.  Currently using Sudafed and Mucinex with minimal relief. Previously tried and failed several medications and nasal sprays. Interested in discussing potential bilateral ear tubes to help relieve these pressures.  Has a soft hyperbaric chamber at home that she uses regularly to improve and maintain her cognitive faculties.  Has been unable to use this regularly as it is too painful with her current sinus/ear pressure.  Past medical history, Surgical history, Family history not pertinant except as noted below, Social history, Allergies, and medications have been entered into the medical record, reviewed, and corrections made.   Review of Systems: No fevers, chills, night sweats, weight loss, chest pain, or shortness of breath.   Objective:    General: Speaking clearly in complete sentences without any shortness of breath.  Alert and oriented x3.  Normal judgment. No apparent acute distress.    Impression and Recommendations:    Ear/sinus pressure Ambulatory referral to ENT- Dr. Radene Journey   Tardive dyskinesia/history of stroke Ambulatory referral to [facility] neurology.  I discussed the assessment and treatment plan with the patient. The patient was provided an opportunity to ask questions and all were answered. The patient agreed with the plan and demonstrated an understanding of the instructions.   The patient was advised to call back or seek an in-person evaluation if the symptoms worsen or if the condition fails to improve as anticipated.  Return if symptoms worsen or fail to improve.  25 minutes of non-face-to-face time was provided during this encounter.  Clearnce Sorrel, DNP, APRN, FNP-BC [facility]

## 2019-02-09 NOTE — Telephone Encounter (Signed)
I think Joy addressed this at recent visit

## 2019-02-11 NOTE — Telephone Encounter (Signed)
Brittney Pace- do we need to do anything?

## 2019-02-11 NOTE — Telephone Encounter (Signed)
[facility] denied the referral. They state there is nothing else they can do for patient. Please advise. - CF

## 2019-02-11 NOTE — Telephone Encounter (Signed)
Pt has been updated of provider's note. She also received a call from [facility]. Pt would like a referral for ENT. As per pt, she wants referral within [facility] system. She does not want referral to be handled by [facility]. Pls advise, thanks.

## 2019-02-11 NOTE — Telephone Encounter (Signed)
Please advise patient of neurology's decision

## 2019-02-13 ENCOUNTER — Other Ambulatory Visit: Payer: Self-pay | Admitting: Osteopathic Medicine

## 2019-02-13 NOTE — Telephone Encounter (Signed)
[facility] [place] requesting med refill for ketoconazole shampoo. Rx not listed in active med list.

## 2019-02-14 ENCOUNTER — Other Ambulatory Visit: Payer: 59

## 2019-02-14 ENCOUNTER — Ambulatory Visit: Payer: 59 | Admitting: Family

## 2019-02-14 ENCOUNTER — Ambulatory Visit: Payer: 59

## 2019-02-14 NOTE — Telephone Encounter (Signed)
Patient already scheduled

## 2019-02-15 ENCOUNTER — Inpatient Hospital Stay: Payer: 59

## 2019-02-15 ENCOUNTER — Ambulatory Visit: Payer: 59 | Admitting: Family

## 2019-02-19 ENCOUNTER — Other Ambulatory Visit: Payer: Self-pay | Admitting: Family

## 2019-02-20 ENCOUNTER — Inpatient Hospital Stay: Payer: 59 | Attending: Hematology & Oncology

## 2019-02-20 ENCOUNTER — Inpatient Hospital Stay (HOSPITAL_BASED_OUTPATIENT_CLINIC_OR_DEPARTMENT_OTHER): Payer: 59 | Admitting: Family

## 2019-02-20 ENCOUNTER — Other Ambulatory Visit: Payer: Self-pay

## 2019-02-20 ENCOUNTER — Encounter: Payer: Self-pay | Admitting: Family

## 2019-02-20 ENCOUNTER — Inpatient Hospital Stay: Payer: 59

## 2019-02-20 VITALS — BP 131/70 | HR 65 | Temp 97.2°F | Resp 18 | Ht 59.0 in | Wt 100.8 lb

## 2019-02-20 DIAGNOSIS — N1831 Chronic kidney disease, stage 3a: Secondary | ICD-10-CM

## 2019-02-20 DIAGNOSIS — N183 Chronic kidney disease, stage 3 unspecified: Secondary | ICD-10-CM

## 2019-02-20 DIAGNOSIS — D631 Anemia in chronic kidney disease: Secondary | ICD-10-CM

## 2019-02-20 DIAGNOSIS — D508 Other iron deficiency anemias: Secondary | ICD-10-CM | POA: Diagnosis not present

## 2019-02-20 DIAGNOSIS — E119 Type 2 diabetes mellitus without complications: Secondary | ICD-10-CM | POA: Diagnosis not present

## 2019-02-20 DIAGNOSIS — D649 Anemia, unspecified: Secondary | ICD-10-CM | POA: Insufficient documentation

## 2019-02-20 DIAGNOSIS — Z79899 Other long term (current) drug therapy: Secondary | ICD-10-CM | POA: Diagnosis not present

## 2019-02-20 DIAGNOSIS — Z794 Long term (current) use of insulin: Secondary | ICD-10-CM | POA: Diagnosis not present

## 2019-02-20 DIAGNOSIS — D638 Anemia in other chronic diseases classified elsewhere: Secondary | ICD-10-CM

## 2019-02-20 DIAGNOSIS — E1022 Type 1 diabetes mellitus with diabetic chronic kidney disease: Secondary | ICD-10-CM

## 2019-02-20 DIAGNOSIS — Z8673 Personal history of transient ischemic attack (TIA), and cerebral infarction without residual deficits: Secondary | ICD-10-CM | POA: Diagnosis not present

## 2019-02-20 DIAGNOSIS — E1122 Type 2 diabetes mellitus with diabetic chronic kidney disease: Secondary | ICD-10-CM | POA: Diagnosis present

## 2019-02-20 LAB — CMP (CANCER CENTER ONLY)
ALT: 15 U/L (ref 0–44)
AST: 23 U/L (ref 15–41)
Albumin: 3.8 g/dL (ref 3.5–5.0)
Alkaline Phosphatase: 74 U/L (ref 38–126)
Anion gap: 9 (ref 5–15)
BUN: 34 mg/dL — ABNORMAL HIGH (ref 8–23)
CO2: 26 mmol/L (ref 22–32)
Calcium: 9.4 mg/dL (ref 8.9–10.3)
Chloride: 100 mmol/L (ref 98–111)
Creatinine: 1.22 mg/dL — ABNORMAL HIGH (ref 0.44–1.00)
GFR, Est AFR Am: 54 mL/min — ABNORMAL LOW (ref >60)
GFR, Estimated: 47 mL/min — ABNORMAL LOW (ref >60)
Glucose, Bld: 247 mg/dL — ABNORMAL HIGH (ref 70–99)
Potassium: 4.7 mmol/L (ref 3.5–5.1)
Sodium: 135 mmol/L (ref 135–145)
Total Bilirubin: 0.5 mg/dL (ref 0.3–1.2)
Total Protein: 6.2 g/dL — ABNORMAL LOW (ref 6.5–8.1)

## 2019-02-20 LAB — CBC WITH DIFFERENTIAL (CANCER CENTER ONLY)
Abs Immature Granulocytes: 0.05 10*3/uL (ref 0.00–0.07)
Basophils Absolute: 0.1 10*3/uL (ref 0.0–0.1)
Basophils Relative: 1 %
Eosinophils Absolute: 0.3 10*3/uL (ref 0.0–0.5)
Eosinophils Relative: 4 %
HCT: 29.3 % — ABNORMAL LOW (ref 36.0–46.0)
Hemoglobin: 9.3 g/dL — ABNORMAL LOW (ref 12.0–15.0)
Immature Granulocytes: 1 %
Lymphocytes Relative: 22 %
Lymphs Abs: 1.5 10*3/uL (ref 0.7–4.0)
MCH: 29 pg (ref 26.0–34.0)
MCHC: 31.7 g/dL (ref 30.0–36.0)
MCV: 91.3 fL (ref 80.0–100.0)
Monocytes Absolute: 0.6 10*3/uL (ref 0.1–1.0)
Monocytes Relative: 8 %
Neutro Abs: 4.4 10*3/uL (ref 1.7–7.7)
Neutrophils Relative %: 64 %
Platelet Count: 260 10*3/uL (ref 150–400)
RBC: 3.21 MIL/uL — ABNORMAL LOW (ref 3.87–5.11)
RDW: 12.2 % (ref 11.5–15.5)
WBC Count: 6.8 10*3/uL (ref 4.0–10.5)
nRBC: 0 % (ref 0.0–0.2)

## 2019-02-20 LAB — RETICULOCYTES
Immature Retic Fract: 8.2 % (ref 2.3–15.9)
RBC.: 3.22 MIL/uL — ABNORMAL LOW (ref 3.87–5.11)
Retic Count, Absolute: 34.5 10*3/uL (ref 19.0–186.0)
Retic Ct Pct: 1.1 % (ref 0.4–3.1)

## 2019-02-20 MED ORDER — EPOETIN ALFA-EPBX 10000 UNIT/ML IJ SOLN
INTRAMUSCULAR | Status: AC
  Filled 2019-02-20: qty 1

## 2019-02-20 MED ORDER — EPOETIN ALFA-EPBX 40000 UNIT/ML IJ SOLN: 40000.0000 [IU] | Freq: Once | INTRAMUSCULAR | Status: DC

## 2019-02-20 MED ORDER — EPOETIN ALFA-EPBX 10000 UNIT/ML IJ SOLN
30000.0000 [IU] | Freq: Once | INTRAMUSCULAR | Status: AC
  Administered 2019-02-20: 13:00:00 30000 [IU] via SUBCUTANEOUS

## 2019-02-20 NOTE — Progress Notes (Signed)
Hematology and Oncology Follow Up Visit  Brittney Pace [id number] Jun 13, 1955 64 y.o. 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 is here today for follow-up. She has some fatigue at times and states that she had a cardiac event last week with chest discomfort and diaphoresis. She took a nitroglycerin and her symptoms resolved. She states that she has had no other issues. I did advise that she follow-up with her cardiologist and let them know. She agreed.  No fever, chills, n/v, cough, rash, palpitations, abdominal pain or changes in bowel or bladder habits.  She states that she has dizziness due to her poor vision.  She has occasional SOB with over exertion and will take a break to rest when needed.  No episodes of bleeding. No bruising or petechiae.  No swelling or tenderness in her extremities.  The neuropathy un her hands and feet is stable and unchanged.  No falls or syncopal episodes to report.  She has maintained a good appetite and is staying well hydrated. Her weight is stable.   ECOG Performance Status: 1 - Symptomatic but completely ambulatory  Medications:  Allergies as of 02/20/2019      Reactions   Dilaudid [hydromorphone] Other (See Comments)   Because of continuous glucose monitor   Morphine Anaphylaxis, Shortness Of Breath   Other reaction(s): Respiratory Distress (ALLERGY/intolerance)   Morphine And Related Shortness Of Breath   Penicillins Hives   Has patient had a PCN reaction causing immediate rash, facial/tongue/throat swelling, SOB or lightheadedness with hypotension: Yes Has patient had a PCN reaction causing severe rash involving mucus membranes or skin necrosis: No Has patient had a PCN reaction that required hospitalization No Has patient had a PCN reaction  occurring within the last 10 years: No If all of the above answers are "NO", then may proceed with Cephalosporin use.   Tramadol Anaphylaxis   Tramadol-acetaminophen Other (See Comments)   Small vessel heart attack Other reaction(s): Cardiovascular Arrest (ALLERGY/intolerance), Other (See Comments)   Acetaminophen Other (See Comments)   Alters insulin pump readings   Amitriptyline Other (See Comments)   Severe headache/ out of body feeling   Codeine Other (See Comments)   Severe headaches/ out of body feeling   Gluten Meal Diarrhea, Nausea And Vomiting   Cramping Only in large quantities   Ibuprofen Other (See Comments)   Messes up CGM reading on glucose monitor Other reaction(s): Other, Other (See Comments)   Losartan Cough   Other Swelling, Other (See Comments)   Cats itching Mold sinuses Cat hair and scratches cause swelling Nuts - Pt stated, "I think I am allergic to [facility] Nuts"   Propoxyphene Other (See Comments)   Severe headaches / out of body feeling   Shellfish Allergy Diarrhea, Nausea And Vomiting   Statins Other (See Comments)   Muscle pains Other reaction(s): Other   Sulfamethoxazole Other (See Comments)   Mouth ulcers   Trichophyton Itching, Other (See Comments)   (fungus) sinusitis   Wheat Bran Diarrhea, Nausea And Vomiting   Cramping Only in large quantities      Medication List       Accurate as of February 20, 2019 11:45 AM. If you have any questions, ask your nurse or doctor.        AMBULATORY NON FORMULARY MEDICATION Incontinence pads per patient preference, #  unlimited, refill x99 Fax to [phone]   arginine 500 MG tablet Take 500 mg by mouth as directed.   Armodafinil 250 MG tablet Take 0.5 tablets (125 mg total) by mouth daily.   aspirin 325 MG EC tablet Take 325 mg by mouth daily.   BENADRYL ALLERGY PO Take by mouth.   carvedilol 3.125 MG tablet Commonly known as: COREG TAKE 1 TABLET TWICE DAILY  WITH MEALS.   ciclopirox 8 %  solution Commonly known as: PENLAC Apply topically at bedtime. Apply over nail and surrounding skin. Apply daily over previous coat. After seven (7) days, may remove with alcohol and continue cycle.   cyclobenzaprine 10 MG tablet Commonly known as: FLEXERIL Take 0.5-1 tablets (5-10 mg total) by mouth 3 (three) times daily as needed for muscle spasms. Caution: can cause drowsiness   diclofenac sodium 1 % Gel Commonly known as: VOLTAREN Apply 2 g topically 4 (four) times daily. To affected joint.   ELDERBERRY PO Take by mouth.   fluocinonide 0.05 % external solution Commonly known as: LIDEX APPLY SOLUTION TOPICALLY ONCE DAILY AS NEEDED TO SCALP   folic acid 1 MG tablet Commonly known as: FOLVITE Take by mouth.   insulin pump Soln Inject into the skin continuous. Novolog insulin   ketoconazole 2 % shampoo Commonly known as: NIZORAL APPLY 1 APPLICATION TOPICALLY 2 (TWO) TIMES A WEEK.   KRILL OIL PO Take by mouth.   Linoleic Acid Conjugated 1000 MG Caps Take 1,000 mg by mouth daily.   MELATONIN PO Take by mouth.   NEXIUM PO Take 20 mg by mouth every morning.   nitroGLYCERIN 0.4 MG/SPRAY spray Commonly known as: NITROLINGUAL Place 1 spray under the tongue every 5 (five) minutes x 3 doses as needed for chest pain.   NON FORMULARY Chloroella   OVER THE COUNTER MEDICATION Take 750 mg by mouth at bedtime. GABA   OVER THE COUNTER MEDICATION as directed. Magnesium L-Treonate   OVER THE COUNTER MEDICATION as directed. Omega EPA/DHA   potassium chloride 10 MEQ tablet Commonly known as: Klor-Con M10 Take 1 tablet (10 mEq total) by mouth 2 (two) times daily. No more refills until appt is scheduled   Praluent 150 MG/ML Soaj Generic drug: Alirocumab Inject 150 mg into the skin every 14 (fourteen) days.   thyroid 60 MG tablet Commonly known as: Armour Thyroid Take 1 tablet (60 mg total) by mouth daily before breakfast.   torsemide 20 MG tablet Commonly known as:  [name] TAKE 1 TABLET ONCE DAILY INTHE MORNING. MAY TAKE AN   EXTRA 1/2 TABLET AS        NEEDED   Vitamin D3 125 MCG (5000 UT) Tabs Take 1,000 Units by mouth daily.       Allergies:  Allergies  Allergen Reactions  . Dilaudid [Hydromorphone] Other (See Comments)    Because of continuous glucose monitor  . Morphine Anaphylaxis and Shortness Of Breath    Other reaction(s): Respiratory Distress (ALLERGY/intolerance)  . Morphine And Related Shortness Of Breath  . Penicillins Hives    Has patient had a PCN reaction causing immediate rash, facial/tongue/throat swelling, SOB or lightheadedness with hypotension: Yes Has patient had a PCN reaction causing severe rash involving mucus membranes or skin necrosis: No Has patient had a PCN reaction that required hospitalization No Has patient had a PCN reaction occurring within the last 10 years: No If all of the above answers are "NO", then may proceed with Cephalosporin use.  . Tramadol Anaphylaxis  . Tramadol-Acetaminophen Other (  See Comments)    Small vessel heart attack Other reaction(s): Cardiovascular Arrest (ALLERGY/intolerance), Other (See Comments)   . Acetaminophen Other (See Comments)    Alters insulin pump readings   . Amitriptyline Other (See Comments)    Severe headache/ out of body feeling  . Codeine Other (See Comments)    Severe headaches/ out of body feeling   . Gluten Meal Diarrhea and Nausea And Vomiting    Cramping Only in large quantities  . Ibuprofen Other (See Comments)    Messes up CGM reading on glucose monitor Other reaction(s): Other, Other (See Comments)   . Losartan Cough  . Other Swelling and Other (See Comments)    Cats itching Mold sinuses Cat hair and scratches cause swelling Nuts - Pt stated, "I think I am allergic to [facility] Nuts"   . Propoxyphene Other (See Comments)    Severe headaches / out of body feeling   . Shellfish Allergy Diarrhea and Nausea And Vomiting  . Statins Other (See Comments)     Muscle pains Other reaction(s): Other  . Sulfamethoxazole Other (See Comments)    Mouth ulcers   . Trichophyton Itching and Other (See Comments)    (fungus) sinusitis   . Wheat Bran Diarrhea and Nausea And Vomiting    Cramping Only in large quantities    Past Medical History, Surgical history, Social history, and Family History were reviewed and updated.  Review of Systems: All other 10 point review of systems is negative.   Physical Exam:  vitals were not taken for this visit.   Wt Readings from Last 3 Encounters:  01/14/19 93 lb (42.2 kg)  12/25/18 92 lb (41.7 kg)  12/13/18 97 lb 0.1 oz (44 kg)    Ocular: Sclerae unicteric, pupils equal, round and reactive to light Ear-nose-throat: Oropharynx clear, dentition fair Lymphatic: No cervical or supraclavicular adenopathy Lungs no rales or rhonchi, good excursion bilaterally Heart regular rate and rhythm, no murmur appreciated Abd soft, nontender, positive bowel sounds, no liver or spleen tip palpated on exam, no fluid wave  MSK no focal spinal tenderness, no joint edema Neuro: non-focal, well-oriented, appropriate affect Breasts: Deferred   Lab Results  Component Value Date   WBC 5.8 01/14/2019   HGB 9.7 (L) 01/14/2019   HCT 30.2 (L) 01/14/2019   MCV 91.5 01/14/2019   PLT 345 01/14/2019   Lab Results  Component Value Date   FERRITIN 51 01/14/2019   IRON 91 01/14/2019   TIBC 274 01/14/2019   UIBC 183 01/14/2019   IRONPCTSAT 33 01/14/2019   Lab Results  Component Value Date   RETICCTPCT 0.9 01/14/2019   RBC 3.26 (L) 01/14/2019   No results found for: KPAFRELGTCHN, LAMBDASER, KAPLAMBRATIO No results found for: IGGSERUM, IGA, IGMSERUM No results found for: Kathrynn Ducking, MSPIKE, SPEI   Chemistry      Component Value Date/Time   NA 134 (L) 01/14/2019 1446   NA 138 07/17/2017 1128   K 3.9 01/14/2019 1446   CL 100 01/14/2019 1446   CO2 27 01/14/2019 1446    BUN 25 (H) 01/14/2019 1446   BUN 23 07/17/2017 1128   CREATININE 1.16 (H) 01/14/2019 1446   CREATININE 0.99 07/20/2018 0821      Component Value Date/Time   CALCIUM 8.9 01/14/2019 1446   ALKPHOS 70 01/14/2019 1446   AST 23 01/14/2019 1446   ALT 17 01/14/2019 1446   BILITOT 0.3 01/14/2019 1446       Impression and Plan:  Ms. Oscarson is a very pleasant 64 yo female with multifactorial anemia.Hgb is a little more decreased 9.3.  I spoke with [name] and we will increase her Retacrit to 30,000 units. She received the injection today.  We will plan to see her back in another month.  She will contact our office with any questions or concerns. We can certainly see her sooner if needed.   Laverna Peace, NP 1/27/202111:45 AM

## 2019-02-20 NOTE — Patient Instructions (Signed)

## 2019-02-21 ENCOUNTER — Ambulatory Visit (INDEPENDENT_AMBULATORY_CARE_PROVIDER_SITE_OTHER): Payer: Medicare Other | Admitting: Otolaryngology

## 2019-02-21 LAB — IRON AND TIBC
Iron: 77 ug/dL (ref 41–142)
Saturation Ratios: 29 % (ref 21–57)
TIBC: 263 ug/dL (ref 236–444)
UIBC: 185 ug/dL (ref 120–384)

## 2019-02-21 LAB — FERRITIN: Ferritin: 87 ng/mL (ref 11–307)

## 2019-02-26 ENCOUNTER — Other Ambulatory Visit: Payer: Self-pay | Admitting: Osteopathic Medicine

## 2019-02-27 ENCOUNTER — Telehealth: Payer: Self-pay | Admitting: Cardiology

## 2019-02-27 NOTE — Telephone Encounter (Signed)
Called patient, she states that 2 weeks ago- she had an episode of chest pains, she states nothing in the last few days, and nothing currently. She states the only thing right now is swelling in her lower legs, and feet, she has noticed is worse. She is not anymore SOB than normal, and states no changes in medications. She states that she has not taken her torsemide recently because it causes her to have low blood pressure so she is nervous to take it. Patient states that she has not checked her BP recently but she did check it today while on the phone with me and it was 148/72 and HR 72. She states it is normally low not high.  I did advised patient that I would send a message over to Dr.Crenshaw to see if any suggestions until her appointment.   Appointment with Dr.Crenshaw 03/06/2019 at 8:40 AM.  Patient verbalized understanding.

## 2019-02-27 NOTE — Telephone Encounter (Signed)
Pt c/o of Chest Pain: STAT if CP now or developed within 24 hours  1. Are you having CP right now? no  2. Are you experiencing any other symptoms (ex. SOB, nausea, vomiting, sweating)? sweating  3. How long have you been experiencing CP? 2 weeks ago  4. Is your CP continuous or coming and going? continuous  5. Have you taken Nitroglycerin? Yes  ? Patient states she had chest pain 2 weeks ago that lasted about an hour. She says she had to use her nitro glycerin a few times for it to help. She has an appointment 2/10 with Dr. Stanford Breed. Please advise.

## 2019-02-27 NOTE — Telephone Encounter (Signed)
Spoke with pt, Aware of dr crenshaw's recommendations.  °

## 2019-02-27 NOTE — Telephone Encounter (Signed)
Would take [name] every other day until office visit. Brittney Pace

## 2019-02-28 NOTE — Progress Notes (Addendum)
HPI: FU CAD. Patient previously followed at [place]; H/O CABG (LIMA to LAD; SVG to RCA); According to the patient, she had 2 stents to the LIMA graft and the LAD in 2000. She's had EECP treatments in [place]. Her last heart catheterization was August 2015 at [facility] which revealed two-vessel coronary disease with a patent LIMA to the LAD and SVG to the RCA. No obstructive disease in left circumflex; EF 65.Last echo January 2020 showed normal LV function, systolic bowing of mitral valve without frank prolapse. Also with history of stroke. Contacted the office recently with CP and lower ext edema.  She was not taking her [name] and this was resumed.  Since last seen,she denies increased dyspnea.  2 weeks ago she had pain in the left axillary area that she states is similar to her cardiac pain.  Lasted 30 minutes and resolved.  She also describes increased bilateral lower extremity edema that is improved with [name].  Current Outpatient Medications  Medication Sig Dispense Refill  . AMBULATORY NON FORMULARY MEDICATION Incontinence pads per patient preference, #unlimited, refill x99 Fax to [phone] 1 Units prn  . arginine 500 MG tablet Take 500 mg by mouth as directed.     . Armodafinil 250 MG tablet Take 0.5 tablets (125 mg total) by mouth daily. 45 tablet 1  . aspirin 325 MG EC tablet Take 325 mg by mouth daily.    . carvedilol (COREG) 3.125 MG tablet TAKE 1 TABLET TWICE DAILY  WITH MEALS. 180 tablet 0  . Cholecalciferol (VITAMIN D3) 5000 UNITS TABS Take 1,000 Units by mouth daily.     . ciclopirox (PENLAC) 8 % solution Apply topically at bedtime. Apply over nail and surrounding skin. Apply daily over previous coat. After seven (7) days, may remove with alcohol and continue cycle. 6.6 mL 0  . cyclobenzaprine (FLEXERIL) 10 MG tablet Take 0.5-1 tablets (5-10 mg total) by mouth 3 (three) times daily as needed for muscle spasms. Caution: can cause drowsiness  60 tablet 1  . diclofenac sodium (VOLTAREN) 1 % GEL Apply 2 g topically 4 (four) times daily. To affected joint. 100 g 11  . diphenhydrAMINE HCl (BENADRYL ALLERGY PO) Take by mouth.    . ELDERBERRY PO Take by mouth.    . Esomeprazole Magnesium (NEXIUM PO) Take 20 mg by mouth every morning.     . fluocinonide (LIDEX) 0.05 % external solution APPLY SOLUTION TOPICALLY ONCE DAILY AS NEEDED TO SCALP    . folic acid (FOLVITE) 1 MG tablet Take by mouth.    . Insulin Human (INSULIN PUMP) SOLN Inject into the skin continuous. Novolog insulin    . ketoconazole (NIZORAL) 2 % shampoo APPLY 1 APPLICATION TOPICALLY 2 (TWO) TIMES A WEEK. 240 mL 1  . KRILL OIL PO Take by mouth.    . Linoleic Acid Conjugated 1000 MG CAPS Take 1,000 mg by mouth daily.     Marland Kitchen MELATONIN PO Take by mouth.    . nitroGLYCERIN (NITROLINGUAL) 0.4 MG/SPRAY spray Place 1 spray under the tongue every 5 (five) minutes x 3 doses as needed for chest pain. 12 g 1  . NON FORMULARY Chloroella    . OVER THE COUNTER MEDICATION Take 750 mg by mouth at bedtime. GABA    . OVER THE COUNTER MEDICATION as directed. Magnesium L-Treonate    . potassium chloride (KLOR-CON M10) 10 MEQ tablet Take 1 tablet (10 mEq total) by mouth 2 (two) times daily. No more  refills until appt is scheduled 180 tablet 3  . thyroid (ARMOUR THYROID) 60 MG tablet Take 1 tablet (60 mg total) by mouth daily before breakfast. 90 tablet 3  . torsemide ([name]) 20 MG tablet TAKE 1 TABLET ONCE DAILY INTHE MORNING. MAY TAKE AN   EXTRA 1/2 TABLET AS        NEEDED (Patient taking differently: every other day. TAKE 1 TABLET ONCE DAILY INTHE MORNING. MAY TAKE AN   EXTRA 1/2 TABLET AS NEEDED) 135 tablet 0   No current facility-administered medications for this visit.     Past Medical History:  Diagnosis Date  . Anemia   . Arthritis   . 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 Chronic kidney disease    stage 3  . Coronary artery disease   . Depression   . Diabetes mellitus  without complication (HCC)    Type 1  . Diabetic retinopathy ([place])   . 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 Family history of adverse reaction to anesthesia    mother: " while she was under she stopped breathing."  . Fibromyalgia   . Gastroparesis   . GERD (gastroesophageal reflux disease)   . Headache    migraines  . Hypothyroidism   . IBS (irritable bowel syndrome)   . Idiopathic edema   . Lyme disease   . Mitral valve prolapse   . Myocardial infarction ([place])    1 major in 1999 and 2 minor " small vessel disease."  . Osteoporosis   . Peripheral neuropathy   . Peripheral vascular disease ([place])   . 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 ABDOMINAL HYSTERECTOMY    . APPENDECTOMY    . BREAST SURGERY     B/L biopsy and lumpectomy   . CARDIAC CATHETERIZATION    . CARPAL TUNNEL RELEASE    . CATARACT EXTRACTION W/ INTRAOCULAR LENS IMPLANT     right eye  . COLONOSCOPY W/ BIOPSIES AND POLYPECTOMY    . CORONARY ARTERY BYPASS GRAFT    . coronary artery stents     at LAD and LIMA  . ECTOPIC PREGNANCY SURGERY    . NASAL SEPTUM SURGERY    . OPEN REDUCTION INTERNAL FIXATION (ORIF) DISTAL RADIAL FRACTURE Right 05/06/2015   Procedure: OPEN REDUCTION INTERNAL FIXATION (ORIF) RIGHT DISTAL RADIAL FRACTURE AND REPAIRS AS NEEDED;  Surgeon: Iran Planas, MD;  Location: [facility];  Service: Orthopedics;  Laterality: Right;  . ORIF WRIST FRACTURE Left 11/05/2014  . ORIF WRIST FRACTURE Left 11/05/2014   Procedure: OPEN REDUCTION INTERNAL FIXATION (ORIF) LEFT WRIST FRACTURE AND REPAIR AS INDICATED;  Surgeon: Iran Planas, MD;  Location: [facility];  Service: Orthopedics;  Laterality: Left;  . TRIGGER FINGER RELEASE      Social History   Socioeconomic History  . Marital status: Married    Spouse name: Not on file  . Number of  children: 0  . Years of education: college  . Highest education level: Not on file  Occupational History  . Occupation: Retired  Tobacco Use  . Smoking status: Former Smoker    Types: Cigarettes  . Smokeless tobacco: Never Used  . Tobacco comment:  " Quit smoking cigarettes in 20's "  Substance and Sexual Activity  . Alcohol use:  Yes    Comment: occasional beer or wine  . Drug use: No  . Sexual activity: Not on file  Other Topics Concern  . Not on file  Social History Narrative   Drinks 2-4 caffeien drinks a day    Social Determinants of Health   Financial Resource Strain:   . Difficulty of Paying Living Expenses: Not on file  Food Insecurity:   . Worried About Charity fundraiser in the Last Year: Not on file  . Ran Out of Food in the Last Year: Not on file  Transportation Needs:   . Lack of Transportation (Medical): Not on file  . Lack of Transportation (Non-Medical): Not on file  Physical Activity:   . Days of Exercise per Week: Not on file  . Minutes of Exercise per Session: Not on file  Stress:   . Feeling of Stress : Not on file  Social Connections:   . Frequency of Communication with Friends and Family: Not on file  . Frequency of Social Gatherings with Friends and Family: Not on file  . Attends Religious Services: Not on file  . Active Member of Clubs or Organizations: Not on file  . Attends Archivist Meetings: Not on file  . Marital Status: Not on file  Intimate Partner Violence:   . Fear of Current or Ex-Partner: Not on file  . Emotionally Abused: Not on file  . Physically Abused: Not on file  . Sexually Abused: Not on file    Family History  Problem Relation Age of Onset  . Breast cancer Mother   . Heart disease Father   . Hypertension Father   . Breast cancer Sister     ROS: no fevers or chills, productive cough, hemoptysis, dysphasia, odynophagia, melena, hematochezia, dysuria, hematuria, rash, seizure activity, orthopnea, PND, pedal  edema, claudication. Remaining systems are negative.  Physical Exam: Well-developed well-nourished in no acute distress.  Skin is warm and dry.  HEENT is normal.  Neck is supple.  Chest is clear to auscultation with normal expansion.  Cardiovascular exam is regular rate and rhythm.  Abdominal exam nontender or distended. No masses palpated. Extremities show no edema. neuro grossly intact  ECG-normal sinus rhythm at a rate of 70, no ST changes, poor R wave progression personally reviewed  A/P  1 acute on chronic diastolic congestive heart failure-patient had recent increase in weight.  [name] was resumed.  Her symptoms have now resolved by her report.  I have asked her to take [name] daily as needed.  She needs fluid restriction and low-sodium diet.  2 coronary artery disease-patient had recent atypical pain in the left axillary area.  She states this is similar to her cardiac pain.  Her electrocardiogram shows no acute ST changes.  We discussed the stress test today but she would prefer conservative measures for now.  Continue aspirin.  She is intolerant to statins.  She continues to try to reestablish in [place] to get EECP.  3 hyperlipidemia-patient is intolerant to statins and Zetia.  4 diabetes mellitus-Per primary care.  5 hypertension-patient's blood pressure is elevated today.  Increase carvedilol to 6.25 mg twice daily and follow.  Kirk Ruths, MD

## 2019-03-06 ENCOUNTER — Ambulatory Visit (INDEPENDENT_AMBULATORY_CARE_PROVIDER_SITE_OTHER): Payer: 59 | Admitting: Cardiology

## 2019-03-06 ENCOUNTER — Ambulatory Visit (INDEPENDENT_AMBULATORY_CARE_PROVIDER_SITE_OTHER): Payer: 59 | Admitting: Psychology

## 2019-03-06 ENCOUNTER — Encounter: Payer: Self-pay | Admitting: Cardiology

## 2019-03-06 ENCOUNTER — Other Ambulatory Visit: Payer: Self-pay

## 2019-03-06 VITALS — BP 150/86 | HR 69 | Ht 59.5 in | Wt 97.0 lb

## 2019-03-06 DIAGNOSIS — R072 Precordial pain: Secondary | ICD-10-CM

## 2019-03-06 DIAGNOSIS — I2510 Atherosclerotic heart disease of native coronary artery without angina pectoris: Secondary | ICD-10-CM

## 2019-03-06 DIAGNOSIS — I5032 Chronic diastolic (congestive) heart failure: Secondary | ICD-10-CM

## 2019-03-06 DIAGNOSIS — F331 Major depressive disorder, recurrent, moderate: Secondary | ICD-10-CM | POA: Diagnosis not present

## 2019-03-06 MED ORDER — ASPIRIN 81 MG PO TBEC: 81.0000 mg | DELAYED_RELEASE_TABLET | Freq: Every day | ORAL | Status: DC

## 2019-03-06 MED ORDER — CARVEDILOL 6.25 MG PO TABS: 6.2500 mg | ORAL_TABLET | Freq: Two times a day (BID) | ORAL | 3 refills | Status: DC

## 2019-03-06 MED ORDER — ASPIRIN EC 325 MG PO TBEC: 325.0000 mg | DELAYED_RELEASE_TABLET | Freq: Every day | ORAL | 0 refills | Status: AC

## 2019-03-06 MED ORDER — NITROGLYCERIN 0.4 MG/SPRAY TL SOLN: 1.0000 | 1 refills | Status: DC | PRN

## 2019-03-06 NOTE — Patient Instructions (Addendum)
Medication Instructions:  INCREASE CARVEDILOL TO 6.25 MG TWICE DAILY= 2 OF THE 3.125 MG TABLETS TWICE DAILY  *If you need a refill on your cardiac medications before your next appointment, please call your pharmacy*  Lab Work: If you have labs (blood work) drawn today and your tests are completely normal, you will receive your results only by: Marland Kitchen MyChart Message (if you have MyChart) OR . A paper copy in the mail If you have any lab test that is abnormal or we need to change your treatment, we will call you to review the results.  Follow-Up: At [facility], you and your health needs are our priority.  As part of our continuing mission to provide you with exceptional heart care, we have created designated Provider Care Teams.  These Care Teams include your primary Cardiologist (physician) and Advanced Practice Providers (APPs -  Physician Assistants and Nurse Practitioners) who all work together to provide you with the care you need, when you need it.  Your next appointment:   3 month(s)  The format for your next appointment:   In Person  Provider:   Kirk Ruths, MD

## 2019-03-19 ENCOUNTER — Encounter: Payer: Self-pay | Admitting: Family

## 2019-03-20 ENCOUNTER — Encounter: Payer: Self-pay | Admitting: Osteopathic Medicine

## 2019-03-22 ENCOUNTER — Inpatient Hospital Stay: Payer: 59 | Admitting: Family

## 2019-03-22 ENCOUNTER — Inpatient Hospital Stay: Payer: 59

## 2019-03-25 ENCOUNTER — Inpatient Hospital Stay: Payer: 59 | Attending: Hematology & Oncology

## 2019-03-25 ENCOUNTER — Encounter: Payer: Self-pay | Admitting: Family

## 2019-03-25 ENCOUNTER — Inpatient Hospital Stay: Payer: 59

## 2019-03-25 ENCOUNTER — Inpatient Hospital Stay (HOSPITAL_BASED_OUTPATIENT_CLINIC_OR_DEPARTMENT_OTHER): Payer: 59 | Admitting: Family

## 2019-03-25 ENCOUNTER — Other Ambulatory Visit: Payer: Self-pay

## 2019-03-25 VITALS — BP 163/70 | HR 64 | Temp 96.9°F | Resp 18 | Ht 59.5 in | Wt 99.8 lb

## 2019-03-25 DIAGNOSIS — N183 Chronic kidney disease, stage 3 unspecified: Secondary | ICD-10-CM

## 2019-03-25 DIAGNOSIS — D631 Anemia in chronic kidney disease: Secondary | ICD-10-CM

## 2019-03-25 DIAGNOSIS — N1831 Chronic kidney disease, stage 3a: Secondary | ICD-10-CM

## 2019-03-25 DIAGNOSIS — E1022 Type 1 diabetes mellitus with diabetic chronic kidney disease: Secondary | ICD-10-CM

## 2019-03-25 DIAGNOSIS — I2510 Atherosclerotic heart disease of native coronary artery without angina pectoris: Secondary | ICD-10-CM

## 2019-03-25 DIAGNOSIS — Z79899 Other long term (current) drug therapy: Secondary | ICD-10-CM | POA: Insufficient documentation

## 2019-03-25 DIAGNOSIS — Z794 Long term (current) use of insulin: Secondary | ICD-10-CM | POA: Insufficient documentation

## 2019-03-25 DIAGNOSIS — D508 Other iron deficiency anemias: Secondary | ICD-10-CM

## 2019-03-25 DIAGNOSIS — Z8673 Personal history of transient ischemic attack (TIA), and cerebral infarction without residual deficits: Secondary | ICD-10-CM | POA: Diagnosis not present

## 2019-03-25 DIAGNOSIS — N189 Chronic kidney disease, unspecified: Secondary | ICD-10-CM | POA: Diagnosis present

## 2019-03-25 DIAGNOSIS — E1122 Type 2 diabetes mellitus with diabetic chronic kidney disease: Secondary | ICD-10-CM | POA: Diagnosis present

## 2019-03-25 DIAGNOSIS — Z7982 Long term (current) use of aspirin: Secondary | ICD-10-CM | POA: Insufficient documentation

## 2019-03-25 DIAGNOSIS — D638 Anemia in other chronic diseases classified elsewhere: Secondary | ICD-10-CM

## 2019-03-25 LAB — CMP (CANCER CENTER ONLY)
ALT: 14 U/L (ref 0–44)
AST: 24 U/L (ref 15–41)
Albumin: 3.9 g/dL (ref 3.5–5.0)
Alkaline Phosphatase: 73 U/L (ref 38–126)
Anion gap: 7 (ref 5–15)
BUN: 22 mg/dL (ref 8–23)
CO2: 26 mmol/L (ref 22–32)
Calcium: 9.4 mg/dL (ref 8.9–10.3)
Chloride: 102 mmol/L (ref 98–111)
Creatinine: 1.29 mg/dL — ABNORMAL HIGH (ref 0.44–1.00)
GFR, Est AFR Am: 51 mL/min — ABNORMAL LOW (ref >60)
GFR, Estimated: 44 mL/min — ABNORMAL LOW (ref >60)
Glucose, Bld: 120 mg/dL — ABNORMAL HIGH (ref 70–99)
Potassium: 4.8 mmol/L (ref 3.5–5.1)
Sodium: 135 mmol/L (ref 135–145)
Total Bilirubin: 0.4 mg/dL (ref 0.3–1.2)
Total Protein: 6.4 g/dL — ABNORMAL LOW (ref 6.5–8.1)

## 2019-03-25 LAB — CBC WITH DIFFERENTIAL (CANCER CENTER ONLY)
Abs Immature Granulocytes: 0.04 10*3/uL (ref 0.00–0.07)
Basophils Absolute: 0.1 10*3/uL (ref 0.0–0.1)
Basophils Relative: 1 %
Eosinophils Absolute: 0.4 10*3/uL (ref 0.0–0.5)
Eosinophils Relative: 5 %
HCT: 29.8 % — ABNORMAL LOW (ref 36.0–46.0)
Hemoglobin: 9.4 g/dL — ABNORMAL LOW (ref 12.0–15.0)
Immature Granulocytes: 1 %
Lymphocytes Relative: 29 %
Lymphs Abs: 1.9 10*3/uL (ref 0.7–4.0)
MCH: 28.5 pg (ref 26.0–34.0)
MCHC: 31.5 g/dL (ref 30.0–36.0)
MCV: 90.3 fL (ref 80.0–100.0)
Monocytes Absolute: 0.6 10*3/uL (ref 0.1–1.0)
Monocytes Relative: 9 %
Neutro Abs: 3.7 10*3/uL (ref 1.7–7.7)
Neutrophils Relative %: 55 %
Platelet Count: 310 10*3/uL (ref 150–400)
RBC: 3.3 MIL/uL — ABNORMAL LOW (ref 3.87–5.11)
RDW: 12.9 % (ref 11.5–15.5)
WBC Count: 6.7 10*3/uL (ref 4.0–10.5)
nRBC: 0 % (ref 0.0–0.2)

## 2019-03-25 LAB — RETICULOCYTES
Immature Retic Fract: 7.2 % (ref 2.3–15.9)
RBC.: 3.24 MIL/uL — ABNORMAL LOW (ref 3.87–5.11)
Retic Count, Absolute: 35.3 10*3/uL (ref 19.0–186.0)
Retic Ct Pct: 1.1 % (ref 0.4–3.1)

## 2019-03-25 MED ORDER — EPOETIN ALFA-EPBX 10000 UNIT/ML IJ SOLN: 30000.0000 [IU] | Freq: Once | INTRAMUSCULAR | Status: DC

## 2019-03-25 MED ORDER — EPOETIN ALFA-EPBX 40000 UNIT/ML IJ SOLN
INTRAMUSCULAR | Status: AC
  Filled 2019-03-25: qty 1

## 2019-03-25 MED ORDER — EPOETIN ALFA-EPBX 10000 UNIT/ML IJ SOLN
30000.0000 [IU] | Freq: Once | INTRAMUSCULAR | Status: AC
  Administered 2019-03-25: 30000 [IU] via SUBCUTANEOUS
  Filled 2019-03-25: qty 3

## 2019-03-25 NOTE — Progress Notes (Signed)
Hematology and Oncology Follow Up Visit  Brittney Pace [id number] [birth year] 64 y.o. 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 She is doing fairly well but having some issues with her seasonal affective disorder and being confined at home. She is trying to walk a little for exercise.  She has noted a rash on her ankles that she thinks may be fungal. She has seen ID for this in the past.  Hgb is 9.4, MCV 90.  She has occasional episodes of chest pain and states that her cardiologist is aware and has increased her Coreg dose to 6.25 mg BID.  She has occasional episodes of SOB with exertion and will rest as needed.  No fever, chills, cough, palpitations or changes in bowel or bladder habits.  She has nausea and abdominal discomfort at times with gastroparesis.  No swelling in her extremities.  The numbness and tingling in her hands and feet is unchanged.  No falls or syncopal episodes to report.  Her appetite comes and goes but she does feel that she is hydrating well. Her weight is stable  ECOG Performance Status: 1 - Symptomatic but completely ambulatory  Medications:  Allergies as of 03/25/2019      Reactions   Dilaudid [hydromorphone] Other (See Comments)   Because of continuous glucose monitor   Morphine Anaphylaxis, Shortness Of Breath   Morphine And Related Shortness Of Breath   Penicillins Hives   Has patient had a PCN reaction causing immediate rash, facial/tongue/throat swelling, SOB or lightheadedness with hypotension: Yes Has patient had a PCN reaction causing severe rash involving mucus membranes or skin necrosis: No Has patient had a PCN reaction that required hospitalization No Has patient had a PCN reaction occurring within the last 10  years: No If all of the above answers are "NO", then may proceed with Cephalosporin use.   Tramadol Anaphylaxis   Tramadol-acetaminophen Other (See Comments)   Small vessel heart attack Other reaction(s): Cardiovascular Arrest (ALLERGY/intolerance), Other (See Comments)   Acetaminophen Other (See Comments)   Alters insulin pump readings   Amitriptyline Other (See Comments)   Severe headache/ out of body feeling   Codeine Other (See Comments)   Severe headaches/ out of body feeling   Gluten Meal Diarrhea, Nausea And Vomiting   Ibuprofen Other (See Comments)   Messes up CGM reading on glucose monitor   Losartan Cough   Other Swelling, Other (See Comments)   Nuts - Pt stated, "I think I am allergic to [facility] Nuts"   Propoxyphene Other (See Comments)   Severe headaches / out of body feeling   Shellfish Allergy Diarrhea, Nausea And Vomiting   Statins Other (See Comments)   Muscle pains Other reaction(s): Other   Sulfamethoxazole Other (See Comments)   Mouth ulcers   Trichophyton Itching, Other (See Comments)   (fungus) sinusitis   Wheat Bran Diarrhea, Nausea And Vomiting      Medication List       Accurate as of March 25, 2019  1:45 PM. If you have any questions, ask your nurse or doctor.        AMBULATORY NON FORMULARY MEDICATION Incontinence pads per patient preference, #unlimited, refill x99 Fax to [phone]   arginine 500 MG tablet Take 500 mg by mouth as directed.   Armodafinil 250  MG tablet Take 0.5 tablets (125 mg total) by mouth daily.   aspirin EC 325 MG tablet Take 1 tablet (325 mg total) by mouth daily.   BENADRYL ALLERGY PO Take by mouth.   carvedilol 6.25 MG tablet Commonly known as: COREG Take 1 tablet (6.25 mg total) by mouth 2 (two) times daily with a meal.   ciclopirox 8 % solution Commonly known as: PENLAC Apply topically at bedtime. Apply over nail and surrounding skin. Apply daily over previous coat. After seven (7) days, may remove with  alcohol and continue cycle.   cyclobenzaprine 10 MG tablet Commonly known as: FLEXERIL Take 0.5-1 tablets (5-10 mg total) by mouth 3 (three) times daily as needed for muscle spasms. Caution: can cause drowsiness   diclofenac sodium 1 % Gel Commonly known as: VOLTAREN Apply 2 g topically 4 (four) times daily. To affected joint.   ELDERBERRY PO Take by mouth.   fluocinonide 0.05 % external solution Commonly known as: LIDEX APPLY SOLUTION TOPICALLY ONCE DAILY AS NEEDED TO SCALP   folic acid 1 MG tablet Commonly known as: FOLVITE Take by mouth.   insulin pump Soln Inject into the skin continuous. Novolog insulin   ketoconazole 2 % shampoo Commonly known as: NIZORAL APPLY 1 APPLICATION TOPICALLY 2 (TWO) TIMES A WEEK.   KRILL OIL PO Take by mouth.   Linoleic Acid Conjugated 1000 MG Caps Take 1,000 mg by mouth daily.   MELATONIN PO Take by mouth.   NEXIUM PO Take 20 mg by mouth every morning.   nitroGLYCERIN 0.4 MG/SPRAY spray Commonly known as: NITROLINGUAL Place 1 spray under the tongue every 5 (five) minutes x 3 doses as needed for chest pain.   NON FORMULARY Chloroella   OVER THE COUNTER MEDICATION Take 750 mg by mouth at bedtime. GABA   OVER THE COUNTER MEDICATION as directed. Magnesium L-Treonate   potassium chloride 10 MEQ tablet Commonly known as: Klor-Con M10 Take 1 tablet (10 mEq total) by mouth 2 (two) times daily. No more refills until appt is scheduled   thyroid 60 MG tablet Commonly known as: Armour Thyroid Take 1 tablet (60 mg total) by mouth daily before breakfast.   torsemide 20 MG tablet Commonly known as: [name] TAKE 1 TABLET ONCE DAILY INTHE MORNING. MAY TAKE AN   EXTRA 1/2 TABLET AS        NEEDED What changed: See the new instructions.   Vitamin D3 125 MCG (5000 UT) Tabs Take 1,000 Units by mouth daily.       Allergies:  Allergies  Allergen Reactions  . Dilaudid [Hydromorphone] Other (See Comments)    Because of continuous  glucose monitor  . Morphine Anaphylaxis and Shortness Of Breath  . Morphine And Related Shortness Of Breath  . Penicillins Hives    Has patient had a PCN reaction causing immediate rash, facial/tongue/throat swelling, SOB or lightheadedness with hypotension: Yes Has patient had a PCN reaction causing severe rash involving mucus membranes or skin necrosis: No Has patient had a PCN reaction that required hospitalization No Has patient had a PCN reaction occurring within the last 10 years: No If all of the above answers are "NO", then may proceed with Cephalosporin use.  . Tramadol Anaphylaxis  . Tramadol-Acetaminophen Other (See Comments)    Small vessel heart attack Other reaction(s): Cardiovascular Arrest (ALLERGY/intolerance), Other (See Comments)   . Acetaminophen Other (See Comments)    Alters insulin pump readings   . Amitriptyline Other (See Comments)    Severe headache/ out  of body feeling  . Codeine Other (See Comments)    Severe headaches/ out of body feeling   . Gluten Meal Diarrhea and Nausea And Vomiting  . Ibuprofen Other (See Comments)    Messes up CGM reading on glucose monitor    . Losartan Cough  . Other Swelling and Other (See Comments)    Nuts - Pt stated, "I think I am allergic to [facility] Nuts"   . Propoxyphene Other (See Comments)    Severe headaches / out of body feeling   . Shellfish Allergy Diarrhea and Nausea And Vomiting  . Statins Other (See Comments)    Muscle pains Other reaction(s): Other  . Sulfamethoxazole Other (See Comments)    Mouth ulcers   . Trichophyton Itching and Other (See Comments)    (fungus) sinusitis   . Wheat Bran Diarrhea and Nausea And Vomiting    Past Medical History, Surgical history, Social history, and Family History were reviewed and updated.  Review of Systems: All other 10 point review of systems is negative.   Physical Exam:  height is 4' 11.5" (1.511 m) and weight is 99 lb 12.8 oz (45.3 kg). Her temporal  temperature is 96.9 F (36.1 C) (abnormal). Her blood pressure is 163/70 (abnormal) and her pulse is 64. Her respiration is 18 and oxygen saturation is 100%.   Wt Readings from Last 3 Encounters:  03/25/19 99 lb 12.8 oz (45.3 kg)  03/06/19 97 lb (44 kg)  02/20/19 100 lb 12.8 oz (45.7 kg)    Ocular: Sclerae unicteric, pupils equal, round and reactive to light Ear-nose-throat: Oropharynx clear, dentition fair Lymphatic: No cervical or supraclavicular adenopathy Lungs no rales or rhonchi, good excursion bilaterally Heart regular rate and rhythm, no murmur appreciated Abd soft, nontender, positive bowel sounds, no liver or spleen tip palpated on exam, no fluid wave  MSK no focal spinal tenderness, no joint edema Neuro: non-focal, well-oriented, appropriate affect Breasts: Deferred   Lab Results  Component Value Date   WBC 6.7 03/25/2019   HGB 9.4 (L) 03/25/2019   HCT 29.8 (L) 03/25/2019   MCV 90.3 03/25/2019   PLT 310 03/25/2019   Lab Results  Component Value Date   FERRITIN 87 02/20/2019   IRON 77 02/20/2019   TIBC 263 02/20/2019   UIBC 185 02/20/2019   IRONPCTSAT 29 02/20/2019   Lab Results  Component Value Date   RETICCTPCT 1.1 03/25/2019   RBC 3.30 (L) 03/25/2019   RBC 3.24 (L) 03/25/2019   No results found for: KPAFRELGTCHN, LAMBDASER, KAPLAMBRATIO No results found for: IGGSERUM, IGA, IGMSERUM No results found for: Odetta Pink, SPEI   Chemistry      Component Value Date/Time   NA 135 02/20/2019 1126   NA 138 07/17/2017 1128   K 4.7 02/20/2019 1126   CL 100 02/20/2019 1126   CO2 26 02/20/2019 1126   BUN 34 (H) 02/20/2019 1126   BUN 23 07/17/2017 1128   CREATININE 1.22 (H) 02/20/2019 1126   CREATININE 0.99 07/20/2018 0821      Component Value Date/Time   CALCIUM 9.4 02/20/2019 1126   ALKPHOS 74 02/20/2019 1126   AST 23 02/20/2019 1126   ALT 15 02/20/2019 1126   BILITOT 0.5 02/20/2019 1126        Impression and Plan:  Ms. Marciel is a very pleasant 64 yo female with multifactorial anemia.Hgb is a little more decreased 9.3.  She received Retacrit today for Hgb 9.4.  We will see  her back in 3 weeks for lab and injection only and follow-up in 6 weeks.  She will contact our office with any questions or concerns. We can certainly see her sooner if needed.   Laverna Peace, NP 3/1/20211:45 PM

## 2019-03-25 NOTE — Patient Instructions (Signed)

## 2019-03-26 LAB — IRON AND TIBC
Iron: 90 ug/dL (ref 41–142)
Saturation Ratios: 31 % (ref 21–57)
TIBC: 286 ug/dL (ref 236–444)
UIBC: 197 ug/dL (ref 120–384)

## 2019-03-26 LAB — FERRITIN: Ferritin: 47 ng/mL (ref 11–307)

## 2019-03-28 ENCOUNTER — Encounter: Payer: Self-pay | Admitting: Family

## 2019-03-28 ENCOUNTER — Encounter: Payer: Self-pay | Admitting: Osteopathic Medicine

## 2019-03-29 ENCOUNTER — Encounter: Payer: Self-pay | Admitting: Family

## 2019-04-01 ENCOUNTER — Other Ambulatory Visit: Payer: Self-pay | Admitting: Osteopathic Medicine

## 2019-04-02 ENCOUNTER — Telehealth (INDEPENDENT_AMBULATORY_CARE_PROVIDER_SITE_OTHER): Payer: 59 | Admitting: Osteopathic Medicine

## 2019-04-02 ENCOUNTER — Encounter: Payer: Self-pay | Admitting: Osteopathic Medicine

## 2019-04-02 VITALS — BP 81/59 | HR 68 | Wt 99.4 lb

## 2019-04-02 DIAGNOSIS — I2510 Atherosclerotic heart disease of native coronary artery without angina pectoris: Secondary | ICD-10-CM | POA: Diagnosis not present

## 2019-04-02 DIAGNOSIS — L508 Other urticaria: Secondary | ICD-10-CM

## 2019-04-02 DIAGNOSIS — I5032 Chronic diastolic (congestive) heart failure: Secondary | ICD-10-CM | POA: Diagnosis not present

## 2019-04-02 DIAGNOSIS — L989 Disorder of the skin and subcutaneous tissue, unspecified: Secondary | ICD-10-CM

## 2019-04-02 DIAGNOSIS — R238 Other skin changes: Secondary | ICD-10-CM

## 2019-04-02 MED ORDER — POTASSIUM CHLORIDE CRYS ER 10 MEQ PO TBCR: 10.0000 meq | EXTENDED_RELEASE_TABLET | Freq: Two times a day (BID) | ORAL | 3 refills | Status: DC

## 2019-04-02 MED ORDER — THYROID 60 MG PO TABS: 60.0000 mg | ORAL_TABLET | Freq: Every day | ORAL | 3 refills | Status: DC

## 2019-04-02 MED ORDER — ARMODAFINIL 250 MG PO TABS: 125.0000 mg | ORAL_TABLET | Freq: Every day | ORAL | 3 refills | Status: DC

## 2019-04-02 MED ORDER — CLOBETASOL PROPIONATE EMULSION 0.05 % EX FOAM: 1.0000 "application " | Freq: Two times a day (BID) | CUTANEOUS | 1 refills | Status: DC

## 2019-04-02 MED ORDER — CARVEDILOL 6.25 MG PO TABS: 6.2500 mg | ORAL_TABLET | Freq: Two times a day (BID) | ORAL | 3 refills | Status: DC

## 2019-04-02 NOTE — Progress Notes (Signed)
Virtual Visit via Video (App used: Doximity) Note  I connected with      Brittney Pace on 04/02/19 at 10:52 AM  by a telemedicine application and verified that I am speaking with the correct person using two identifiers.  Patient is at home I am in office   I discussed the limitations of evaluation and management by telemedicine and the availability of in person appointments. The patient expressed understanding and agreed to proceed.  History of Present Illness: Brittney Pace is a 64 y.o. female who would like to discuss Leg problem   R leg had an open wound so she was concerned. Ongoing about  Amonth, over that time got worse but now better. Slef-treatment w/ neosporin w/ anti0itching component, has been using colocun oil w/ lavender and almond oil after showering, using this bid. Itching is an issue.  Scalp itching has been an sisue, ketoconazole shampoo is used consistently and this has not been too helpful      Observations/Objective: BP (!) 81/59   Pulse 68   Wt 99 lb 6.4 oz (45.1 kg)   BMI 19.74 kg/m  BP Readings from Last 3 Encounters:  04/02/19 (!) 81/59  03/25/19 (!) 163/70  03/06/19 (!) 150/86   Exam: Normal Speech.  NAD Skin visible - poor video quality but no erythema or ulceration - appears to have normal granulation tissue   Lab and Radiology Results No results found for this or any previous visit (from the past 72 hour(s)). No results found.     Assessment and Plan: 64 y.o. female with The primary encounter diagnosis was Normal color of skin over lesion. Diagnoses of Chronic diastolic heart failure ([place]), Coronary artery disease involving native coronary artery of native heart without angina pectoris, Chronic urticaria, and Scalp irritation were also pertinent to this visit.   Discussed wound care, would d/c irritants, keep clean and moisturized Trial steroid foam for scalp Advised she consider antihistamine for urticaria    PDMP not  reviewed this encounter. No orders of the defined types were placed in this encounter.  Meds ordered this encounter  Medications  . Clobetasol Propionate Emulsion 0.05 % topical foam    Sig: Apply 1 application topically 2 (two) times daily.    Dispense:  100 g    Refill:  1  . Armodafinil 250 MG tablet    Sig: Take 0.5 tablets (125 mg total) by mouth daily.    Dispense:  45 tablet    Refill:  3  . thyroid (ARMOUR THYROID) 60 MG tablet    Sig: Take 1 tablet (60 mg total) by mouth daily before breakfast.    Dispense:  90 tablet    Refill:  3  . potassium chloride (KLOR-CON M10) 10 MEQ tablet    Sig: Take 1 tablet (10 mEq total) by mouth 2 (two) times daily. No more refills until appt is scheduled    Dispense:  180 tablet    Refill:  3  . carvedilol (COREG) 6.25 MG tablet    Sig: Take 1 tablet (6.25 mg total) by mouth 2 (two) times daily with a meal.    Dispense:  180 tablet    Refill:  3     Follow Up Instructions: Return if symptoms worsen or fail to improve.    I discussed the assessment and treatment plan with the patient. The patient was provided an opportunity to ask questions and all were answered. The patient agreed with the plan and demonstrated an  understanding of the instructions.   The patient was advised to call back or seek an in-person evaluation if any new concerns, if symptoms worsen or if the condition fails to improve as anticipated.  30 minutes of non-face-to-face time was provided during this encounter.      . . . . . . . . . . . . . Marland Kitchen                   Historical information moved to improve visibility of documentation.  Past Medical History:  Diagnosis Date  . Anemia   . Arthritis   . 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 Chronic kidney disease    stage 3  . Coronary artery disease   . Depression   . Diabetes mellitus without complication (HCC)    Type 1  . Diabetic retinopathy ([place])   .  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 Family history of adverse reaction to anesthesia    mother: " while she was under she stopped breathing."  . Fibromyalgia   . Gastroparesis   . GERD (gastroesophageal reflux disease)   . Headache    migraines  . Hypothyroidism   . IBS (irritable bowel syndrome)   . Idiopathic edema   . Lyme disease   . Mitral valve prolapse   . Myocardial infarction ([place])    1 major in 1999 and 2 minor " small vessel disease."  . Osteoporosis   . Peripheral neuropathy   . Peripheral vascular disease ([place])   . 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 ABDOMINAL HYSTERECTOMY    . APPENDECTOMY    . BREAST SURGERY     B/L biopsy and lumpectomy   . CARDIAC CATHETERIZATION    . CARPAL TUNNEL RELEASE    . CATARACT EXTRACTION W/ INTRAOCULAR LENS IMPLANT     right eye  . COLONOSCOPY W/ BIOPSIES AND POLYPECTOMY    . CORONARY ARTERY BYPASS GRAFT    . coronary artery stents     at [place] and [place]  . ECTOPIC PREGNANCY SURGERY    . NASAL SEPTUM SURGERY    . OPEN REDUCTION INTERNAL FIXATION (ORIF) DISTAL RADIAL FRACTURE Right 05/06/2015   Procedure: OPEN REDUCTION INTERNAL FIXATION (ORIF) RIGHT DISTAL RADIAL FRACTURE AND REPAIRS AS NEEDED;  Surgeon: Iran Planas, MD;  Location: [place];  Service: Orthopedics;  Laterality: Right;  . ORIF WRIST FRACTURE Left 11/05/2014  . ORIF WRIST FRACTURE Left 11/05/2014   Procedure: OPEN REDUCTION INTERNAL FIXATION (ORIF) LEFT WRIST FRACTURE AND REPAIR AS INDICATED;  Surgeon: Iran Planas, MD;  Location: [place];  Service: Orthopedics;  Laterality: Left;  . TRIGGER FINGER RELEASE     Social History   Tobacco Use  . Smoking status: Former Smoker    Types: Cigarettes  . Smokeless tobacco: Never Used  . Tobacco comment:  " Quit smoking cigarettes in 20's "  Substance  Use Topics  . Alcohol use: Yes    Comment: occasional beer or wine   family history includes Breast cancer in her mother and sister; Heart disease in her father; Hypertension in her father.  Medications: Current Outpatient Medications  Medication Sig Dispense Refill  . AMBULATORY NON FORMULARY MEDICATION Incontinence pads per patient preference, #unlimited, refill x99 Fax  to [phone] 1 Units prn  . arginine 500 MG tablet Take 500 mg by mouth as directed.     . Armodafinil 250 MG tablet Take 0.5 tablets (125 mg total) by mouth daily. 45 tablet 3  . aspirin EC 325 MG tablet Take 1 tablet (325 mg total) by mouth daily. 30 tablet 0  . carvedilol (COREG) 6.25 MG tablet Take 1 tablet (6.25 mg total) by mouth 2 (two) times daily with a meal. 180 tablet 3  . Cholecalciferol (VITAMIN D3) 5000 UNITS TABS Take 1,000 Units by mouth daily.     . ciclopirox (PENLAC) 8 % solution Apply topically at bedtime. Apply over nail and surrounding skin. Apply daily over previous coat. After seven (7) days, may remove with alcohol and continue cycle. 6.6 mL 0  . cyclobenzaprine (FLEXERIL) 10 MG tablet Take 0.5-1 tablets (5-10 mg total) by mouth 3 (three) times daily as needed for muscle spasms. Caution: can cause drowsiness 60 tablet 1  . diclofenac sodium (VOLTAREN) 1 % GEL Apply 2 g topically 4 (four) times daily. To affected joint. 100 g 11  . diphenhydrAMINE HCl (BENADRYL ALLERGY PO) Take by mouth.    . ELDERBERRY PO Take by mouth.    . Esomeprazole Magnesium (NEXIUM PO) Take 20 mg by mouth every morning.     . fluocinonide (LIDEX) 0.05 % external solution APPLY SOLUTION TOPICALLY ONCE DAILY AS NEEDED TO SCALP    . folic acid (FOLVITE) 1 MG tablet Take by mouth.    . Insulin Human (INSULIN PUMP) SOLN Inject into the skin continuous. Novolog insulin    . ketoconazole (NIZORAL) 2 % shampoo APPLY 1 APPLICATION TOPICALLY 2 (TWO) TIMES A WEEK. 240 mL 1  . KRILL OIL PO Take by mouth.    . Linoleic Acid  Conjugated 1000 MG CAPS Take 1,000 mg by mouth daily.     Marland Kitchen MELATONIN PO Take by mouth.    . nitroGLYCERIN (NITROLINGUAL) 0.4 MG/SPRAY spray Place 1 spray under the tongue every 5 (five) minutes x 3 doses as needed for chest pain. 12 g 1  . NON FORMULARY Chloroella    . OVER THE COUNTER MEDICATION Take 750 mg by mouth at bedtime. GABA    . OVER THE COUNTER MEDICATION as directed. Magnesium L-Treonate    . potassium chloride (KLOR-CON M10) 10 MEQ tablet Take 1 tablet (10 mEq total) by mouth 2 (two) times daily. No more refills until appt is scheduled 180 tablet 3  . thyroid (ARMOUR THYROID) 60 MG tablet Take 1 tablet (60 mg total) by mouth daily before breakfast. 90 tablet 3  . torsemide ([name]) 20 MG tablet TAKE 1 TABLET ONCE DAILY INTHE MORNING. MAY TAKE AN   EXTRA 1/2 TABLET AS        NEEDED (Patient taking differently: every other day. TAKE 1 TABLET ONCE DAILY INTHE MORNING. MAY TAKE AN   EXTRA 1/2 TABLET AS NEEDED) 135 tablet 0  . Clobetasol Propionate Emulsion 0.05 % topical foam Apply 1 application topically 2 (two) times daily. 100 g 1   No current facility-administered medications for this visit.   Allergies  Allergen Reactions  . Dilaudid [Hydromorphone] Other (See Comments)    Because of continuous glucose monitor  . Morphine Anaphylaxis and Shortness Of Breath  . Morphine And Related Shortness Of Breath  . Penicillins Hives    Has patient had a PCN reaction causing immediate rash, facial/tongue/throat swelling, SOB or lightheadedness with hypotension: Yes Has patient had a PCN reaction causing  severe rash involving mucus membranes or skin necrosis: No Has patient had a PCN reaction that required hospitalization No Has patient had a PCN reaction occurring within the last 10 years: No If all of the above answers are "NO", then may proceed with Cephalosporin use.  . Tramadol Anaphylaxis  . Tramadol-Acetaminophen Other (See Comments)    Small vessel heart attack Other  reaction(s): Cardiovascular Arrest (ALLERGY/intolerance), Other (See Comments)   . Acetaminophen Other (See Comments)    Alters insulin pump readings   . Amitriptyline Other (See Comments)    Severe headache/ out of body feeling  . Codeine Other (See Comments)    Severe headaches/ out of body feeling   . Gluten Meal Diarrhea and Nausea And Vomiting  . Ibuprofen Other (See Comments)    Messes up CGM reading on glucose monitor    . Losartan Cough  . Other Swelling and Other (See Comments)    Nuts - Pt stated, "I think I am allergic to [facility] Nuts"   . Propoxyphene Other (See Comments)    Severe headaches / out of body feeling   . Shellfish Allergy Diarrhea and Nausea And Vomiting  . Statins Other (See Comments)    Muscle pains Other reaction(s): Other  . Sulfamethoxazole Other (See Comments)    Mouth ulcers   . Trichophyton Itching and Other (See Comments)    (fungus) sinusitis   . Wheat Bran Diarrhea and Nausea And Vomiting

## 2019-04-02 NOTE — Progress Notes (Signed)
Call 1: Attempted to contact patient for [place] triage @ [phone]. No answer.  Call 2: Attempted to contact patient for [place] triage @ [phone]. No answer.  Call 3: Attempted to contact patient for [place] triage @ [phone]. No answer. Left voice message for callback.   Requesting refills: Klor-Con @ [facility]  Thyroid @ [facility]

## 2019-04-03 ENCOUNTER — Ambulatory Visit: Payer: 59 | Admitting: Cardiology

## 2019-04-06 ENCOUNTER — Other Ambulatory Visit: Payer: Self-pay | Admitting: Nurse Practitioner

## 2019-04-10 MED ORDER — POTASSIUM CHLORIDE CRYS ER 10 MEQ PO TBCR: 10.0000 meq | EXTENDED_RELEASE_TABLET | Freq: Two times a day (BID) | ORAL | 3 refills | Status: DC

## 2019-04-10 MED ORDER — CARVEDILOL 6.25 MG PO TABS: 6.2500 mg | ORAL_TABLET | Freq: Two times a day (BID) | ORAL | 3 refills | Status: DC

## 2019-04-10 NOTE — Addendum Note (Signed)
Addended by: Maryla Morrow on: 04/10/2019 05:14 PM   Modules accepted: Orders

## 2019-04-11 ENCOUNTER — Other Ambulatory Visit: Payer: Self-pay

## 2019-04-11 DIAGNOSIS — I2510 Atherosclerotic heart disease of native coronary artery without angina pectoris: Secondary | ICD-10-CM

## 2019-04-11 DIAGNOSIS — I5032 Chronic diastolic (congestive) heart failure: Secondary | ICD-10-CM

## 2019-04-11 MED ORDER — POTASSIUM CHLORIDE CRYS ER 10 MEQ PO TBCR: 10.0000 meq | EXTENDED_RELEASE_TABLET | Freq: Two times a day (BID) | ORAL | 0 refills | Status: DC

## 2019-04-11 MED ORDER — CARVEDILOL 6.25 MG PO TABS: 6.2500 mg | ORAL_TABLET | Freq: Two times a day (BID) | ORAL | 3 refills | Status: DC

## 2019-04-11 NOTE — Telephone Encounter (Signed)
Phone call to [facility] due to Rx refill requests, per Dr. Sheppard Coil, failed to transmit on 04/10/19 for Carvedilol 6.25 mg, and Klor Con 10 mEq.   Was advised that the pt. Has refills on Klor Con, and that the pt. should call the Customer Care line to request a refill.  The [facility] Rep. stated that the pt. got a 90 day supply of Carvedilol in Feb., from [place], and that she would not be eligible for refills prior to May 2021, and that [place] does not have any Prescription on file for Carvedilol.  Phone call to pt.  Advised of the above information re: [employer], [employer]film[employer]video. refills.  The pt. stated that she requested Rx's be sent to [facility] on the Klor Con and Carvedilol, for convenience.  Stated she does have plenty of Carvedilol at this time, but is almost out of Klor Con.  Advised pt. will send request to Dr. Sheppard Coil to order a 30 day supply of Klor Con at local [facility], to hold her until shipment arrives from [place], and to resend new Rx for Carvedilol to [facility].  Pt. Verb. Understanding.  Agreed with plan.

## 2019-04-15 ENCOUNTER — Inpatient Hospital Stay: Payer: 59

## 2019-04-15 ENCOUNTER — Other Ambulatory Visit: Payer: Self-pay

## 2019-04-15 VITALS — BP 148/82 | HR 73 | Temp 97.0°F | Resp 17

## 2019-04-15 DIAGNOSIS — D631 Anemia in chronic kidney disease: Secondary | ICD-10-CM

## 2019-04-15 DIAGNOSIS — N1831 Chronic kidney disease, stage 3a: Secondary | ICD-10-CM

## 2019-04-15 DIAGNOSIS — E1022 Type 1 diabetes mellitus with diabetic chronic kidney disease: Secondary | ICD-10-CM

## 2019-04-15 DIAGNOSIS — N183 Chronic kidney disease, stage 3 unspecified: Secondary | ICD-10-CM

## 2019-04-15 DIAGNOSIS — E1122 Type 2 diabetes mellitus with diabetic chronic kidney disease: Secondary | ICD-10-CM | POA: Diagnosis not present

## 2019-04-15 DIAGNOSIS — D638 Anemia in other chronic diseases classified elsewhere: Secondary | ICD-10-CM

## 2019-04-15 LAB — CBC WITH DIFFERENTIAL (CANCER CENTER ONLY)
Abs Immature Granulocytes: 0.01 10*3/uL (ref 0.00–0.07)
Basophils Absolute: 0.1 10*3/uL (ref 0.0–0.1)
Basophils Relative: 2 %
Eosinophils Absolute: 0.3 10*3/uL (ref 0.0–0.5)
Eosinophils Relative: 5 %
HCT: 30.9 % — ABNORMAL LOW (ref 36.0–46.0)
Hemoglobin: 9.6 g/dL — ABNORMAL LOW (ref 12.0–15.0)
Immature Granulocytes: 0 %
Lymphocytes Relative: 26 %
Lymphs Abs: 1.5 10*3/uL (ref 0.7–4.0)
MCH: 28.5 pg (ref 26.0–34.0)
MCHC: 31.1 g/dL (ref 30.0–36.0)
MCV: 91.7 fL (ref 80.0–100.0)
Monocytes Absolute: 0.5 10*3/uL (ref 0.1–1.0)
Monocytes Relative: 8 %
Neutro Abs: 3.5 10*3/uL (ref 1.7–7.7)
Neutrophils Relative %: 59 %
Platelet Count: 427 10*3/uL — ABNORMAL HIGH (ref 150–400)
RBC: 3.37 MIL/uL — ABNORMAL LOW (ref 3.87–5.11)
RDW: 13.4 % (ref 11.5–15.5)
WBC Count: 5.9 10*3/uL (ref 4.0–10.5)
nRBC: 0 % (ref 0.0–0.2)

## 2019-04-15 LAB — CMP (CANCER CENTER ONLY)
ALT: 14 U/L (ref 0–44)
AST: 23 U/L (ref 15–41)
Albumin: 3.7 g/dL (ref 3.5–5.0)
Alkaline Phosphatase: 79 U/L (ref 38–126)
Anion gap: 7 (ref 5–15)
BUN: 22 mg/dL (ref 8–23)
CO2: 28 mmol/L (ref 22–32)
Calcium: 9.3 mg/dL (ref 8.9–10.3)
Chloride: 99 mmol/L (ref 98–111)
Creatinine: 1.21 mg/dL — ABNORMAL HIGH (ref 0.44–1.00)
GFR, Est AFR Am: 55 mL/min — ABNORMAL LOW (ref >60)
GFR, Estimated: 47 mL/min — ABNORMAL LOW (ref >60)
Glucose, Bld: 147 mg/dL — ABNORMAL HIGH (ref 70–99)
Potassium: 4.7 mmol/L (ref 3.5–5.1)
Sodium: 134 mmol/L — ABNORMAL LOW (ref 135–145)
Total Bilirubin: 0.4 mg/dL (ref 0.3–1.2)
Total Protein: 6.2 g/dL — ABNORMAL LOW (ref 6.5–8.1)

## 2019-04-15 MED ORDER — EPOETIN ALFA-EPBX 10000 UNIT/ML IJ SOLN
30000.0000 [IU] | Freq: Once | INTRAMUSCULAR | Status: AC
  Administered 2019-04-15: 30000 [IU] via SUBCUTANEOUS

## 2019-04-15 MED ORDER — EPOETIN ALFA-EPBX 10000 UNIT/ML IJ SOLN
INTRAMUSCULAR | Status: AC
  Filled 2019-04-15: qty 3

## 2019-04-15 NOTE — Patient Instructions (Signed)

## 2019-04-22 ENCOUNTER — Telehealth: Payer: Self-pay | Admitting: Osteopathic Medicine

## 2019-04-22 NOTE — Telephone Encounter (Signed)
PA has been submitted for patient for her Modalifil. Pharmacy aware we are waiting on a response.

## 2019-04-23 ENCOUNTER — Telehealth: Payer: Self-pay | Admitting: Osteopathic Medicine

## 2019-04-23 NOTE — Telephone Encounter (Signed)
Received fax for prior authorization on Armodafinil filled out formed had physician sign and faxed to [facility] waiting on determination. - CF

## 2019-04-25 NOTE — Telephone Encounter (Signed)
Received fax from [place] and they denied coverage on Armodafinil. I am placing in providers box for further review. - CF

## 2019-04-29 ENCOUNTER — Telehealth: Payer: Self-pay

## 2019-04-29 NOTE — Telephone Encounter (Signed)
Pt called stating that [place] is requesting a prior authorization for armodafinil. Contact number to initiate prior authorization is [phone].

## 2019-04-30 NOTE — Telephone Encounter (Signed)
Jenny Reichmann is working on this. Closing encounter.

## 2019-04-30 NOTE — Telephone Encounter (Signed)
Patient called to check on the status of her PA.  [facility]

## 2019-05-01 NOTE — Telephone Encounter (Signed)
Medication was denied and placed in providers box for review. - CF

## 2019-05-03 ENCOUNTER — Other Ambulatory Visit: Payer: Self-pay | Admitting: Family

## 2019-05-03 DIAGNOSIS — D631 Anemia in chronic kidney disease: Secondary | ICD-10-CM

## 2019-05-03 DIAGNOSIS — N1831 Chronic kidney disease, stage 3a: Secondary | ICD-10-CM

## 2019-05-03 DIAGNOSIS — D508 Other iron deficiency anemias: Secondary | ICD-10-CM

## 2019-05-03 NOTE — Telephone Encounter (Signed)
Patient left voicemail regarding medication status.  States that she is running low and wanted to take care of the refill before she ran out.  Banjamin Stovall,CMA

## 2019-05-06 ENCOUNTER — Encounter (HOSPITAL_BASED_OUTPATIENT_CLINIC_OR_DEPARTMENT_OTHER): Payer: Self-pay | Admitting: *Deleted

## 2019-05-06 ENCOUNTER — Encounter: Payer: Self-pay | Admitting: Family

## 2019-05-06 ENCOUNTER — Inpatient Hospital Stay: Payer: 59 | Attending: Hematology & Oncology

## 2019-05-06 ENCOUNTER — Inpatient Hospital Stay: Payer: 59

## 2019-05-06 ENCOUNTER — Other Ambulatory Visit: Payer: Self-pay

## 2019-05-06 ENCOUNTER — Emergency Department (HOSPITAL_BASED_OUTPATIENT_CLINIC_OR_DEPARTMENT_OTHER)
Admission: EM | Admit: 2019-05-06 | Discharge: 2019-05-06 | Disposition: A | Discharge status: Home / Self Care | Payer: 59 | Attending: Emergency Medicine | Admitting: Emergency Medicine

## 2019-05-06 ENCOUNTER — Inpatient Hospital Stay (HOSPITAL_BASED_OUTPATIENT_CLINIC_OR_DEPARTMENT_OTHER): Payer: 59 | Admitting: Family

## 2019-05-06 VITALS — BP 208/111 | HR 67 | Temp 97.3°F | Resp 20 | Ht 59.5 in | Wt 98.4 lb

## 2019-05-06 DIAGNOSIS — Z7982 Long term (current) use of aspirin: Secondary | ICD-10-CM | POA: Insufficient documentation

## 2019-05-06 DIAGNOSIS — E1022 Type 1 diabetes mellitus with diabetic chronic kidney disease: Secondary | ICD-10-CM | POA: Insufficient documentation

## 2019-05-06 DIAGNOSIS — N1831 Chronic kidney disease, stage 3a: Secondary | ICD-10-CM

## 2019-05-06 DIAGNOSIS — I2510 Atherosclerotic heart disease of native coronary artery without angina pectoris: Secondary | ICD-10-CM | POA: Diagnosis not present

## 2019-05-06 DIAGNOSIS — I130 Hypertensive heart and chronic kidney disease with heart failure and stage 1 through stage 4 chronic kidney disease, or unspecified chronic kidney disease: Secondary | ICD-10-CM | POA: Diagnosis not present

## 2019-05-06 DIAGNOSIS — I5032 Chronic diastolic (congestive) heart failure: Secondary | ICD-10-CM | POA: Insufficient documentation

## 2019-05-06 DIAGNOSIS — E039 Hypothyroidism, unspecified: Secondary | ICD-10-CM | POA: Insufficient documentation

## 2019-05-06 DIAGNOSIS — N183 Chronic kidney disease, stage 3 unspecified: Secondary | ICD-10-CM | POA: Insufficient documentation

## 2019-05-06 DIAGNOSIS — Z951 Presence of aortocoronary bypass graft: Secondary | ICD-10-CM | POA: Insufficient documentation

## 2019-05-06 DIAGNOSIS — Z87891 Personal history of nicotine dependence: Secondary | ICD-10-CM | POA: Diagnosis not present

## 2019-05-06 DIAGNOSIS — Z79899 Other long term (current) drug therapy: Secondary | ICD-10-CM | POA: Insufficient documentation

## 2019-05-06 DIAGNOSIS — D631 Anemia in chronic kidney disease: Secondary | ICD-10-CM | POA: Diagnosis not present

## 2019-05-06 DIAGNOSIS — Z8673 Personal history of transient ischemic attack (TIA), and cerebral infarction without residual deficits: Secondary | ICD-10-CM | POA: Insufficient documentation

## 2019-05-06 DIAGNOSIS — I252 Old myocardial infarction: Secondary | ICD-10-CM | POA: Diagnosis not present

## 2019-05-06 DIAGNOSIS — D508 Other iron deficiency anemias: Secondary | ICD-10-CM

## 2019-05-06 DIAGNOSIS — I10 Essential (primary) hypertension: Secondary | ICD-10-CM

## 2019-05-06 LAB — CBC WITH DIFFERENTIAL (CANCER CENTER ONLY)
Abs Immature Granulocytes: 0.07 10*3/uL (ref 0.00–0.07)
Basophils Absolute: 0.1 10*3/uL (ref 0.0–0.1)
Basophils Relative: 2 %
Eosinophils Absolute: 0.4 10*3/uL (ref 0.0–0.5)
Eosinophils Relative: 5 %
HCT: 31.5 % — ABNORMAL LOW (ref 36.0–46.0)
Hemoglobin: 10.1 g/dL — ABNORMAL LOW (ref 12.0–15.0)
Immature Granulocytes: 1 %
Lymphocytes Relative: 27 %
Lymphs Abs: 1.7 10*3/uL (ref 0.7–4.0)
MCH: 28.1 pg (ref 26.0–34.0)
MCHC: 32.1 g/dL (ref 30.0–36.0)
MCV: 87.5 fL (ref 80.0–100.0)
Monocytes Absolute: 0.6 10*3/uL (ref 0.1–1.0)
Monocytes Relative: 10 %
Neutro Abs: 3.6 10*3/uL (ref 1.7–7.7)
Neutrophils Relative %: 55 %
Platelet Count: 300 10*3/uL (ref 150–400)
RBC: 3.6 MIL/uL — ABNORMAL LOW (ref 3.87–5.11)
RDW: 12.9 % (ref 11.5–15.5)
WBC Count: 6.4 10*3/uL (ref 4.0–10.5)
nRBC: 0 % (ref 0.0–0.2)

## 2019-05-06 LAB — CMP (CANCER CENTER ONLY)
ALT: 12 U/L (ref 0–44)
AST: 21 U/L (ref 15–41)
Albumin: 3.8 g/dL (ref 3.5–5.0)
Alkaline Phosphatase: 78 U/L (ref 38–126)
Anion gap: 8 (ref 5–15)
BUN: 24 mg/dL — ABNORMAL HIGH (ref 8–23)
CO2: 25 mmol/L (ref 22–32)
Calcium: 9 mg/dL (ref 8.9–10.3)
Chloride: 99 mmol/L (ref 98–111)
Creatinine: 1.07 mg/dL — ABNORMAL HIGH (ref 0.44–1.00)
GFR, Est AFR Am: >60 mL/min (ref >60)
GFR, Estimated: 55 mL/min — ABNORMAL LOW (ref >60)
Glucose, Bld: 160 mg/dL — ABNORMAL HIGH (ref 70–99)
Potassium: 3.9 mmol/L (ref 3.5–5.1)
Sodium: 132 mmol/L — ABNORMAL LOW (ref 135–145)
Total Bilirubin: 0.3 mg/dL (ref 0.3–1.2)
Total Protein: 6 g/dL — ABNORMAL LOW (ref 6.5–8.1)

## 2019-05-06 LAB — RETICULOCYTES
Immature Retic Fract: 2.3 % (ref 2.3–15.9)
RBC.: 3.59 MIL/uL — ABNORMAL LOW (ref 3.87–5.11)
Retic Count, Absolute: 31.6 10*3/uL (ref 19.0–186.0)
Retic Ct Pct: 0.9 % (ref 0.4–3.1)

## 2019-05-06 MED ORDER — AMLODIPINE BESYLATE 5 MG PO TABS
5.0000 mg | ORAL_TABLET | Freq: Once | ORAL | Status: AC
  Administered 2019-05-06: 5 mg via ORAL
  Filled 2019-05-06: qty 1

## 2019-05-06 MED ORDER — AMLODIPINE BESYLATE 5 MG PO TABS: 5.0000 mg | ORAL_TABLET | Freq: Every day | ORAL | 0 refills | Status: DC

## 2019-05-06 NOTE — Progress Notes (Signed)
Hematology and Oncology Follow Up Visit  Brittney Pace [id number] January 21, 1956 64 y.o. 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 She is not feeling well and states that she feels like she is going to "fall out". Her BP is 206/91 and HR 67. O2 is 100% and temp 97.3.  No fever, chills, n/v, cough, rash, SOB, chest pain, palpitations, abdominal pain or changes in bladder habits.  She has been having diarrhea.  She states that her nephrologist stopped her diuretic and has discussed starting her on Lisinopril. She has not yet followed up with her BP readings. The patient states that she feels she needs to go to the ED.  No changes in appetite or hydration. Wt stable.   ECOG Performance Status: 1 - Symptomatic but completely ambulatory  Medications:  Allergies as of 05/06/2019      Reactions   Dilaudid [hydromorphone] Other (See Comments)   Because of continuous glucose monitor   Morphine Anaphylaxis, Shortness Of Breath   Morphine And Related Shortness Of Breath   Penicillins Hives   Has patient had a PCN reaction causing immediate rash, facial/tongue/throat swelling, SOB or lightheadedness with hypotension: Yes Has patient had a PCN reaction causing severe rash involving mucus membranes or skin necrosis: No Has patient had a PCN reaction that required hospitalization No Has patient had a PCN reaction occurring within the last 10 years: No If all of the above answers are "NO", then may proceed with Cephalosporin use.   Tramadol Anaphylaxis   Tramadol-acetaminophen Other (See Comments)   Small vessel heart attack Other reaction(s): Cardiovascular Arrest (ALLERGY/intolerance), Other (See Comments)   Acetaminophen Other (See Comments)   Alters insulin  pump readings   Amitriptyline Other (See Comments)   Severe headache/ out of body feeling   Codeine Other (See Comments)   Severe headaches/ out of body feeling   Gluten Meal Diarrhea, Nausea And Vomiting   Ibuprofen Other (See Comments)   Messes up CGM reading on glucose monitor   Losartan Cough   Other Swelling, Other (See Comments)   Nuts - Pt stated, "I think I am allergic to [facility] Nuts"   Propoxyphene Other (See Comments)   Severe headaches / out of body feeling   Shellfish Allergy Diarrhea, Nausea And Vomiting   Statins Other (See Comments)   Muscle pains Other reaction(s): Other   Sulfamethoxazole Other (See Comments)   Mouth ulcers   Trichophyton Itching, Other (See Comments)   (fungus) sinusitis   Wheat Bran Diarrhea, Nausea And Vomiting      Medication List       Accurate as of May 06, 2019  1:24 PM. If you have any questions, ask your nurse or doctor.        AMBULATORY NON FORMULARY MEDICATION Incontinence pads per patient preference, #unlimited, refill [phone] Fax to [phone]   arginine 500 MG tablet Take 500 mg by mouth as directed.   Armodafinil 250 MG tablet Take 0.5 tablets (125 mg total) by mouth daily.   aspirin EC 325 MG tablet Take 1 tablet (325 mg total) by mouth daily.   BENADRYL ALLERGY PO Take by mouth.   carvedilol 6.25 MG tablet Commonly known as: COREG Take 1 tablet (6.25 mg total) by mouth 2 (two) times daily with  a meal.   ciclopirox 8 % solution Commonly known as: PENLAC Apply topically at bedtime. Apply over nail and surrounding skin. Apply daily over previous coat. After seven (7) days, may remove with alcohol and continue cycle.   Clobetasol Propionate Emulsion 0.05 % topical foam Apply 1 application topically 2 (two) times daily.   cyclobenzaprine 10 MG tablet Commonly known as: FLEXERIL Take 0.5-1 tablets (5-10 mg total) by mouth 3 (three) times daily as needed for muscle spasms. Caution: can cause drowsiness     diclofenac sodium 1 % Gel Commonly known as: VOLTAREN Apply 2 g topically 4 (four) times daily. To affected joint.   ELDERBERRY PO Take by mouth.   fluocinonide 0.05 % external solution Commonly known as: LIDEX APPLY SOLUTION TOPICALLY ONCE DAILY AS NEEDED TO SCALP   folic acid 1 MG tablet Commonly known as: FOLVITE Take by mouth.   insulin pump Soln Inject into the skin continuous. Novolog insulin   ketoconazole 2 % shampoo Commonly known as: NIZORAL APPLY 1 APPLICATION TOPICALLY 2 (TWO) TIMES A WEEK.   KRILL OIL PO Take by mouth.   Linoleic Acid Conjugated 1000 MG Caps Take 1,000 mg by mouth daily.   MELATONIN PO Take by mouth.   NEXIUM PO Take 20 mg by mouth every morning.   nitroGLYCERIN 0.4 MG/SPRAY spray Commonly known as: NITROLINGUAL Place 1 spray under the tongue every 5 (five) minutes x 3 doses as needed for chest pain.   NON FORMULARY Chloroella   OVER THE COUNTER MEDICATION Take 750 mg by mouth at bedtime. GABA   OVER THE COUNTER MEDICATION as directed. Magnesium L-Treonate   potassium chloride 10 MEQ tablet Commonly known as: Klor-Con M10 Take 1 tablet (10 mEq total) by mouth 2 (two) times daily.   thyroid 60 MG tablet Commonly known as: Armour Thyroid Take 1 tablet (60 mg total) by mouth daily before breakfast.   torsemide 20 MG tablet Commonly known as: [name] TAKE 1 TABLET ONCE DAILY INTHE MORNING. MAY TAKE AN   EXTRA 1/2 TABLET AS        NEEDED What changed: See the new instructions.   Vitamin D3 125 MCG (5000 UT) Tabs Take 1,000 Units by mouth daily.       Allergies:  Allergies  Allergen Reactions  . Dilaudid [Hydromorphone] Other (See Comments)    Because of continuous glucose monitor  . Morphine Anaphylaxis and Shortness Of Breath  . Morphine And Related Shortness Of Breath  . Penicillins Hives    Has patient had a PCN reaction causing immediate rash, facial/tongue/throat swelling, SOB or lightheadedness with  hypotension: Yes Has patient had a PCN reaction causing severe rash involving mucus membranes or skin necrosis: No Has patient had a PCN reaction that required hospitalization No Has patient had a PCN reaction occurring within the last 10 years: No If all of the above answers are "NO", then may proceed with Cephalosporin use.  . Tramadol Anaphylaxis  . Tramadol-Acetaminophen Other (See Comments)    Small vessel heart attack Other reaction(s): Cardiovascular Arrest (ALLERGY/intolerance), Other (See Comments)   . Acetaminophen Other (See Comments)    Alters insulin pump readings   . Amitriptyline Other (See Comments)    Severe headache/ out of body feeling  . Codeine Other (See Comments)    Severe headaches/ out of body feeling   . Gluten Meal Diarrhea and Nausea And Vomiting  . Ibuprofen Other (See Comments)    Messes up CGM reading on glucose monitor    .  Losartan Cough  . Other Swelling and Other (See Comments)    Nuts - Pt stated, "I think I am allergic to [facility] Nuts"   . Propoxyphene Other (See Comments)    Severe headaches / out of body feeling   . Shellfish Allergy Diarrhea and Nausea And Vomiting  . Statins Other (See Comments)    Muscle pains Other reaction(s): Other  . Sulfamethoxazole Other (See Comments)    Mouth ulcers   . Trichophyton Itching and Other (See Comments)    (fungus) sinusitis   . Wheat Bran Diarrhea and Nausea And Vomiting    Past Medical History, Surgical history, Social history, and Family History were reviewed and updated.  Review of Systems: All other 10 point review of systems is negative.   Physical Exam:  vitals were not taken for this visit.   Wt Readings from Last 3 Encounters:  04/02/19 99 lb 6.4 oz (45.1 kg)  03/25/19 99 lb 12.8 oz (45.3 kg)  03/06/19 97 lb (44 kg)    Ocular: Sclerae unicteric, pupils equal, round and reactive to light Ear-nose-throat: Oropharynx clear, dentition fair Lymphatic: No cervical or  supraclavicular adenopathy Lungs no rales or rhonchi, good excursion bilaterally Heart regular rate and rhythm, no murmur appreciated Abd soft, nontender, positive bowel sounds MSK no focal spinal tenderness, no joint edema Neuro: non-focal, well-oriented, appropriate affect Breasts: Deferred   Lab Results  Component Value Date   WBC 5.9 04/15/2019   HGB 9.6 (L) 04/15/2019   HCT 30.9 (L) 04/15/2019   MCV 91.7 04/15/2019   PLT 427 (H) 04/15/2019   Lab Results  Component Value Date   FERRITIN 47 03/25/2019   IRON 90 03/25/2019   TIBC 286 03/25/2019   UIBC 197 03/25/2019   IRONPCTSAT 31 03/25/2019   Lab Results  Component Value Date   RETICCTPCT 1.1 03/25/2019   RBC 3.37 (L) 04/15/2019   No results found for: KPAFRELGTCHN, LAMBDASER, KAPLAMBRATIO No results found for: IGGSERUM, IGA, IGMSERUM No results found for: Ronnald Ramp, A1GS, A2GS, Violet Baldy, MSPIKE, SPEI   Chemistry      Component Value Date/Time   NA 134 (L) 04/15/2019 1248   NA 138 07/17/2017 1128   K 4.7 04/15/2019 1248   CL 99 04/15/2019 1248   CO2 28 04/15/2019 1248   BUN 22 04/15/2019 1248   BUN 23 07/17/2017 1128   CREATININE 1.21 (H) 04/15/2019 1248   CREATININE 0.99 07/20/2018 0821      Component Value Date/Time   CALCIUM 9.3 04/15/2019 1248   ALKPHOS 79 04/15/2019 1248   AST 23 04/15/2019 1248   ALT 14 04/15/2019 1248   BILITOT 0.4 04/15/2019 1248       Impression and Plan: Ms. Breuer is a very pleasant 64 yo female with multifactorial anemia. Retacrit held and patient taken downstairs to ED in wheelchair by staff.  Report called to ED charge nurse.  Will plan to follow-up with patient in another 3-4 weeks once she is feeling better.   Laverna Peace, NP 4/12/20211:24 PM

## 2019-05-06 NOTE — Discharge Instructions (Addendum)
Start taking the Norvasc as prescribed.  Follow-up with your nephrologist to review your blood pressure and discuss treatment.   Return to the ED for chest pain, shortness of breath, severe headache or other concerns.

## 2019-05-06 NOTE — ED Triage Notes (Signed)
Pt sent from [place] [facility] for eval of HTN

## 2019-05-06 NOTE — ED Provider Notes (Signed)
[facility] EMERGENCY DEPARTMENT Provider Note   CSN: [id number] Arrival date & time: 05/06/19  1352     History Chief Complaint  Patient presents with  . Hypertension    Brittney Pace is a 64 y.o. female.  HPI   Patient has multiple medical problems including diabetes and chronic kidney disease.  Patient states throughout her life she has been told that her blood pressure has been low.  Patient had not been on any medications for hypertension.  However, the patient has had complications with coronary artery disease, chronic kidney disease and strokes.  Patient states recently however she has been told that her blood pressure has been elevated.  He was able to review the medical records and as far back as December 2020 she had documented blood pressures of 163/62.  More recently on April 2 she was at her nephrologist office and the blood pressure was 177/89.  Patient states her nephrologist was going to start her on medications but she has not received a prescription yet.  Patient went to her hematologist office today upstairs to get her erythropoietin injection.  Her blood pressure was noted to be significantly elevated.  Patient mentioned at the office that she was feeling lightheaded and felt she needed to go to the ED.  She was sent down to the ED for treatment.  Patient denies having any trouble with chest pain.  She denies shortness of breath.  She denies numbness or weakness.  Patient denies feeling off balance to me.  She indicates sometimes she does get lightheaded but this is chronic and not new for her.  Past Medical History:  Diagnosis Date  . Anemia   . Arthritis   . 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 Chronic kidney disease    stage 3  . Coronary artery disease   . Depression   . Diabetes mellitus without complication (HCC)    Type 1  . Diabetic retinopathy ([place])   . 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 Family history of adverse  reaction to anesthesia    mother: " while she was under she stopped breathing."  . Fibromyalgia   . Gastroparesis   . GERD (gastroesophageal reflux disease)   . Headache    migraines  . Hypothyroidism   . IBS (irritable bowel syndrome)   . Idiopathic edema   . Lyme disease   . Mitral valve prolapse   . Myocardial infarction ([place])    1 major in 1999 and 2 minor " small vessel disease."  . Osteoporosis   . Peripheral neuropathy   . Peripheral vascular disease ([place])   . 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 Stroke [facility])    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" " the second stroke was a lacunar     Patient Active Problem List   Diagnosis Date Noted  . DNR (do not resuscitate) 12/10/2018  . Dizziness 10/22/2018  . Ear fullness, bilateral 10/22/2018  . Tinnitus, right 10/22/2018  . 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 Arthritis 02/23/2018  . Asthma 02/23/2018  . Carpal tunnel syndrome 02/23/2018  . Gastroesophageal reflux disease 02/23/2018  . Kidney disease 02/23/2018  . Seasonal affective disorder ([place]) 02/23/2018  . Electrolyte abnormality 02/20/2017  . Sepsis secondary to UTI ([place]) 02/15/2017  . Sepsis with metabolic encephalopathy ([place]) 02/15/2017  . Hypertension associated with diabetes ([place]) 02/14/2017  . Encephalopathy acute 12/23/2016  . Brain atrophy ([place]) 05/24/2016  .  Chronic headache 05/24/2016  . Postmenopausal osteoporosis 05/03/2016  . Neuropathy 04/20/2016  . Lacunar stroke ([place]) 01/20/2016  . Memory loss 01/20/2016  . Other fatigue 01/20/2016  . CAD (coronary artery disease) of artery bypass graft 01/20/2016  . Depression 01/20/2016  . Right lateral epicondylitis 12/16/2015  . 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 Lyme disease 12/16/2015  . Small intestinal bacterial overgrowth 11/19/2015  . Hyperlipidemia 09/25/2015  . Abdominal bloating 09/04/2015  . Closed displaced intra-articular fracture of right calcaneus 06/16/2015  . Myopia  of both eyes with astigmatism and presbyopia 05/21/2015  . Vitreous syneresis of both eyes 05/21/2015  . Combined form of age-related cataract, left eye 05/21/2015  . 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 Pseudophakia, right eye 05/21/2015  . Surgery, elective 05/06/2015  . CAD (coronary artery disease) 11/19/2014  . Diabetes mellitus type I ([place]) 11/19/2014  . DDD (degenerative disc disease), cervical 03/11/2014  . Cervical spondylosis with radiculopathy 03/11/2014  . Spondylolisthesis of cervical region 03/11/2014  . Vitreous hemorrhage of left eye ([place]) 03/03/2014  . DDD (degenerative disc disease), thoracic 01/07/2014  . Anemia due to stage 3 chronic kidney disease 12/16/2013  . Osteoporosis 11/14/2013  . Chronic diastolic heart failure ([place]) 09/18/2013  . Cataract due to secondary diabetes ([place]) 08/26/2013  . 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 associated with type 2 diabetes mellitus ([place]) 08/26/2013  . Secondary diabetes mellitus with ophthalmic complication ([place]) 58/09/9831  . Type 1 diabetes mellitus with hyperosmolar coma ([place]) 08/26/2013  . Hypothyroidism due to acquired atrophy of thyroid 08/02/2013  . Acquired hypothyroidism 08/02/2013  . Cerebral artery occlusion with cerebral infarction ([place]) 07/14/2013  . History of diabetic gastroparesis 04/09/2013  . Irritable bowel syndrome with diarrhea 04/09/2013  . Diarrhea 04/09/2013  . History of endocrine, metabolic or immunity disorder 04/09/2013  . Anemia of chronic disease 04/08/2013  . 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 ([place]) 82/50/5397  . CKD stage 3 due to type 1 diabetes mellitus ([place]) 04/08/2013  . Anemia 04/08/2013    Past Surgical History:  Procedure Laterality Date  . ABDOMINAL HYSTERECTOMY    . APPENDECTOMY    . BREAST SURGERY     B/L biopsy and lumpectomy   . CARDIAC CATHETERIZATION    . CARPAL TUNNEL RELEASE    . CATARACT  EXTRACTION W/ INTRAOCULAR LENS IMPLANT     right eye  . COLONOSCOPY W/ BIOPSIES AND POLYPECTOMY    . CORONARY ARTERY BYPASS GRAFT    . coronary artery stents     at [place] and [place]  . ECTOPIC PREGNANCY SURGERY    . NASAL SEPTUM SURGERY    . OPEN REDUCTION INTERNAL FIXATION (ORIF) DISTAL RADIAL FRACTURE Right 05/06/2015   Procedure: OPEN REDUCTION INTERNAL FIXATION (ORIF) RIGHT DISTAL RADIAL FRACTURE AND REPAIRS AS NEEDED;  Surgeon: Iran Planas, MD;  Location: [facility];  Service: Orthopedics;  Laterality: Right;  . ORIF WRIST FRACTURE Left 11/05/2014  . ORIF WRIST FRACTURE Left 11/05/2014   Procedure: OPEN REDUCTION INTERNAL FIXATION (ORIF) LEFT WRIST FRACTURE AND REPAIR AS INDICATED;  Surgeon: Iran Planas, MD;  Location: [facility];  Service: Orthopedics;  Laterality: Left;  . TRIGGER FINGER RELEASE       OB History   No obstetric history on file.     Family History  Problem Relation Age of Onset  . Breast cancer Mother   . Heart disease Father   . Hypertension Father   . Breast cancer Sister  Social History   Tobacco Use  . Smoking status: Former Smoker    Types: Cigarettes  . Smokeless tobacco: Never Used  . Tobacco comment:  " Quit smoking cigarettes in 20's "  Substance Use Topics  . Alcohol use: Yes    Comment: occasional beer or wine  . Drug use: No    Home Medications Prior to Admission medications   Medication Sig Start Date End Date Taking? Authorizing Provider  AMBULATORY NON FORMULARY MEDICATION Incontinence pads per patient preference, #unlimited, refill x99 Fax to [phone] 12/25/18   Emeterio Reeve, DO  amLODipine (NORVASC) 5 MG tablet Take 1 tablet (5 mg total) by mouth daily. 05/06/19   Dorie Rank, MD  arginine 500 MG tablet Take 500 mg by mouth as directed.     [provider]  Armodafinil 250 MG tablet Take 0.5 tablets (125 mg total) by mouth daily. 04/02/19   Emeterio Reeve, DO  aspirin EC 325 MG tablet Take 1 tablet (325 mg total) by  mouth daily. 03/06/19   Lelon Perla, MD  carvedilol (COREG) 6.25 MG tablet Take 1 tablet (6.25 mg total) by mouth 2 (two) times daily with a meal. 04/11/19   Emeterio Reeve, DO  Cholecalciferol (VITAMIN D3) 5000 UNITS TABS Take 1,000 Units by mouth daily.     [provider]  ciclopirox (PENLAC) 8 % solution Apply topically at bedtime. Apply over nail and surrounding skin. Apply daily over previous coat. After seven (7) days, may remove with alcohol and continue cycle. 10/05/18   Emeterio Reeve, DO  Clobetasol Propionate Emulsion 0.05 % topical foam Apply 1 application topically 2 (two) times daily. 04/02/19   Emeterio Reeve, DO  Continuous Blood Gluc Sensor (DEXCOM G6 SENSOR) MISC USE TO CONTINUOUSLY MONITOR BLOOD SUGARS. CHANGE EVERY 10 DAYS 04/02/19   [provider]  Continuous Blood Gluc Transmit (DEXCOM G6 TRANSMITTER) MISC USE TO CONTINUOUSLY MONITOR BLOOD SUGARS. CHANGE EVERY THREE MONTHS. 04/02/19   [provider]  cyclobenzaprine (FLEXERIL) 10 MG tablet Take 0.5-1 tablets (5-10 mg total) by mouth 3 (three) times daily as needed for muscle spasms. Caution: can cause drowsiness 12/10/18   Emeterio Reeve, DO  diclofenac sodium (VOLTAREN) 1 % GEL Apply 2 g topically 4 (four) times daily. To affected joint. 10/25/18   Gregor Hams, MD  diphenhydrAMINE HCl (BENADRYL ALLERGY PO) Take by mouth.    [provider]  ELDERBERRY PO Take by mouth.    [provider]  Esomeprazole Magnesium (NEXIUM PO) Take 20 mg by mouth every morning.     [provider]  fluocinonide (LIDEX) 0.05 % external solution APPLY SOLUTION TOPICALLY ONCE DAILY AS NEEDED TO SCALP 01/20/18   [provider]  folic acid (FOLVITE) 1 MG tablet Take by mouth.    [provider]  insulin aspart (NOVOLOG) 100 UNIT/ML injection USE VIA INSULIN PUMP TOTAL DAILY INSULIN 40 UNITS 04/16/19   [provider]  Insulin Human (INSULIN PUMP) SOLN Inject  into the skin continuous. Novolog insulin    [provider]  ketoconazole (NIZORAL) 2 % shampoo APPLY 1 APPLICATION TOPICALLY 2 (TWO) TIMES A WEEK. 02/14/19   Emeterio Reeve, DO  KRILL OIL PO Take by mouth.    [provider]  Linoleic Acid Conjugated 1000 MG CAPS Take 1,000 mg by mouth daily.     [provider]  MELATONIN PO Take by mouth.    [provider]  nitroGLYCERIN (NITROLINGUAL) 0.4 MG/SPRAY spray Place 1 spray under the  tongue every 5 (five) minutes x 3 doses as needed for chest pain. 03/06/19   Lelon Perla, MD  NON FORMULARY Chloroella    [provider]  OVER THE COUNTER MEDICATION Take 750 mg by mouth at bedtime. GABA    [provider]  OVER THE COUNTER MEDICATION as directed. Magnesium L-Treonate    [provider]  potassium chloride (KLOR-CON M10) 10 MEQ tablet Take 1 tablet (10 mEq total) by mouth 2 (two) times daily. 04/11/19   Emeterio Reeve, DO  thyroid [facility] THYROID) 60 MG tablet Take 1 tablet (60 mg total) by mouth daily before breakfast. 04/02/19   Emeterio Reeve, DO  torsemide ([name]) 20 MG tablet TAKE 1 TABLET ONCE DAILY INTHE MORNING. MAY TAKE AN   EXTRA 1/2 TABLET AS        NEEDED Patient taking differently: every other day. TAKE 1 TABLET ONCE DAILY INTHE MORNING. MAY TAKE AN   EXTRA 1/2 TABLET AS NEEDED 01/15/18   Trixie Dredge, PA-C    Allergies    Dilaudid [hydromorphone], Morphine, Morphine and related, Penicillins, Tramadol, Tramadol-acetaminophen, Acetaminophen, Amitriptyline, Codeine, Gluten meal, Ibuprofen, Losartan, Other, Propoxyphene, Shellfish allergy, Statins, Sulfamethoxazole, Trichophyton, and Wheat bran  Review of Systems   Review of Systems  All other systems reviewed and are negative.   Physical Exam Updated Vital Signs BP (!) 209/93 (BP Location: Right Arm)   Pulse 71   Temp 98.1 F (36.7 C)   Resp 18   SpO2 97%   Physical Exam Vitals and  nursing note reviewed.  Constitutional:      General: She is not in acute distress.    Appearance: She is well-developed.  HENT:     Head: Normocephalic and atraumatic.     Right Ear: External ear normal.     Left Ear: External ear normal.  Eyes:     General: No scleral icterus.       Right eye: No discharge.        Left eye: No discharge.     Conjunctiva/sclera: Conjunctivae normal.  Neck:     Trachea: No tracheal deviation.  Cardiovascular:     Rate and Rhythm: Normal rate and regular rhythm.  Pulmonary:     Effort: Pulmonary effort is normal. No respiratory distress.     Breath sounds: Normal breath sounds. No stridor. No wheezing or rales.  Abdominal:     General: Bowel sounds are normal. There is no distension.     Palpations: Abdomen is soft.     Tenderness: There is no abdominal tenderness. There is no guarding or rebound.  Musculoskeletal:        General: No tenderness.     Cervical back: Neck supple.  Skin:    General: Skin is warm and dry.     Findings: No rash.  Neurological:     Mental Status: She is alert and oriented to person, place, and time.     Cranial Nerves: Cranial nerves are intact. No cranial nerve deficit (No facial droop, extraocular movements intact, tongue midline ).     Sensory: No sensory deficit.     Motor: Motor function is intact. No abnormal muscle tone or seizure activity.     Coordination: Coordination normal.     Comments: 5 out of 5 upper extremity and lower extremity strength, sensation intact in all extremities, no visual field cuts, no left or right sided neglect, patient is able to walk without difficulty, no nystagmus noted  ED Results / Procedures / Treatments   Labs (all labs ordered are listed, but only abnormal results are displayed) Patient had a CBC today at the cancer center.  Hemoglobin is 10.1.  White blood cell count of 6.4.  Metabolic panel showed a BUN and creatinine of 24 and 1.07 EKG None  Radiology No  results found.  Procedures Procedures (including critical care time)  Medications Ordered in ED Medications  amLODipine (NORVASC) tablet 5 mg (5 mg Oral Given 05/06/19 1456)    ED Course  I have reviewed the triage vital signs and the nursing notes.  Pertinent labs & imaging results that were available during my care of the patient were reviewed by me and considered in my medical decision making (see chart for details).  Clinical Course as of May 05 1537  Mon May 06, 2019  1451 BP 177/89 on 04/26/19 per records   [JK]  1452 03/06/19 bp 150/86   [JK]  1455 Dec of 2020 BP 163/62     [JK]    Clinical Course User Index [JK] Dorie Rank, MD   MDM Rules/Calculators/A&P                      Patient presented to ED for evaluation of hypertension.  Patient is not having any complaints of chest pain or shortness of breath.  She has no focal neurologic deficits.  No findings to suggest hypertensive emergency.  Patient is significantly hypertensive but upon reviewing her previous records the patient has been hypertensive for several months now.  She is not currently on any medications.  I gave her a dose of oral Norvasc.  I will give her prescription for Norvasc and have her closely follow-up with her nephrologist to make sure her blood pressure is improving and to discuss long-term treatment plans Final Clinical Impression(s) / ED Diagnoses Final diagnoses:  Hypertension, unspecified type    Rx / DC Orders ED Discharge Orders         Ordered    amLODipine (NORVASC) 5 MG tablet  Daily     05/06/19 1538           Dorie Rank, MD 05/06/19 1539

## 2019-05-07 ENCOUNTER — Telehealth: Payer: Self-pay | Admitting: Family

## 2019-05-07 LAB — FERRITIN: Ferritin: 57 ng/mL (ref 11–307)

## 2019-05-07 LAB — IRON AND TIBC
Iron: 68 ug/dL (ref 41–142)
Saturation Ratios: 21 % (ref 21–57)
TIBC: 316 ug/dL (ref 236–444)
UIBC: 249 ug/dL (ref 120–384)

## 2019-05-07 NOTE — Telephone Encounter (Signed)
Appointments scheduled calendar & letter was also mailed per 4/12  los

## 2019-05-11 ENCOUNTER — Encounter: Payer: Self-pay | Admitting: Osteopathic Medicine

## 2019-05-13 NOTE — Telephone Encounter (Signed)
Routing to covering provider. Prior authorization for Armodafinil was denied by her insurance on 05/03/19.

## 2019-05-14 NOTE — Telephone Encounter (Signed)
I spoke with patient and advised her the Armodafinil was denied because she doesn't have a diagnosis of narcolepsy. I did schedule patient a visit to look at different medications that may help chronic fatigue.

## 2019-05-15 NOTE — Telephone Encounter (Signed)
Can also pay OOP.Marland KitchenMarland Kitchen

## 2019-05-16 ENCOUNTER — Encounter: Payer: Self-pay | Admitting: Hematology & Oncology

## 2019-05-20 ENCOUNTER — Ambulatory Visit (INDEPENDENT_AMBULATORY_CARE_PROVIDER_SITE_OTHER): Payer: 59 | Admitting: Osteopathic Medicine

## 2019-05-20 ENCOUNTER — Ambulatory Visit: Payer: 59

## 2019-05-20 ENCOUNTER — Encounter: Payer: Self-pay | Admitting: Osteopathic Medicine

## 2019-05-20 ENCOUNTER — Other Ambulatory Visit: Payer: Self-pay

## 2019-05-20 VITALS — BP 180/87 | HR 70 | Ht 60.0 in | Wt 97.0 lb

## 2019-05-20 DIAGNOSIS — R197 Diarrhea, unspecified: Secondary | ICD-10-CM

## 2019-05-20 DIAGNOSIS — R109 Unspecified abdominal pain: Secondary | ICD-10-CM | POA: Diagnosis not present

## 2019-05-20 DIAGNOSIS — R1031 Right lower quadrant pain: Secondary | ICD-10-CM | POA: Diagnosis not present

## 2019-05-20 DIAGNOSIS — R1032 Left lower quadrant pain: Secondary | ICD-10-CM

## 2019-05-20 DIAGNOSIS — M79604 Pain in right leg: Secondary | ICD-10-CM

## 2019-05-20 DIAGNOSIS — I2510 Atherosclerotic heart disease of native coronary artery without angina pectoris: Secondary | ICD-10-CM

## 2019-05-20 LAB — HEMOGLOBIN A1C: Hemoglobin A1C: 7.8

## 2019-05-20 MED ORDER — AMLODIPINE BESYLATE 5 MG PO TABS: 5.0000 mg | ORAL_TABLET | Freq: Every day | ORAL | 0 refills | Status: DC

## 2019-05-20 MED ORDER — MODAFINIL 200 MG PO TABS: 100.0000 mg | ORAL_TABLET | Freq: Every day | ORAL | 1 refills | Status: DC

## 2019-05-20 NOTE — Patient Instructions (Addendum)
   Ultrasound today for DVT L lower leg   Will get CT abdomen to evaluate for colitis  Still start the pancreatic enzymes  Encourage blood work today  Definitely get material from lab to provide stool specimen   BP: continue spironolactone, ADD Norvasc aka amlodipine at 5 mg for first few days, if BP still >140/90 go up to 10 mg   Will try the modafinil as cheaper alternative to armodafinil - sent to [facility], please use Good Rx coupon with this

## 2019-05-20 NOTE — Progress Notes (Signed)
Brittney Pace is a 64 y.o. female who presents to  [facility] [facility]  today, 05/20/19, seeking care for the following: . HTN - recently started on amlodipine by ER but not taking. BP was 165/71 today at endocrinologist (no notes are up yet, looks like they started spironolactone awhile back but then she started it after she ranout of the amlodipine from the ER - timing is a bit confusing here). Hx losartan causing cough.  . Concern for blood clot - pain in LLE, pt concerned about blood clot  . Diarrhea, watery stool, chronic issue but getting worse, (+)LLQ pain on exa no rebound guarding but she is tender. Tmax 55F . Hx chronic fatigue, has been on armodafinil for years, no diagnosed narcopelsy, insurance will not pay for stimulant anymore, requests alternative.      ASSESSMENT & PLAN with other pertinent history/findings:  The primary encounter diagnosis was Abdominal pain, LLQ. Diagnoses of Abdominal pain, RLQ, Diarrhea, unspecified type, Abdominal pain, unspecified abdominal location, and Acute pain of right lower extremity were also pertinent to this visit.   Patient Instructions   Ultrasound today for DVT L lower leg   Will get CT abdomen to evaluate for colitis  Still start the pancreatic enzymes  Encourage blood work today  Definitely get material from lab to provide stool specimen   BP: continue spironolactone, ADD Norvasc aka amlodipine at 5 mg for first few days, if BP still >140/90 go up to 10 mg   Will try the modafinil as cheaper alternative to armodafinil - sent to [facility], please use Good Rx coupon with this       Orders Placed This Encounter  Procedures  . Stool Culture  . Ova and parasite examination  . CT Abdomen Pelvis W Contrast  . US Venous Img Lower Unilateral Right  . US Venous Img Lower Unilateral Left (DVT)  . COMPLETE METABOLIC PANEL WITH GFR  . Stool, WBC/Lactoferrin  . C. difficile  GDH and Toxin A/B  . Lipase    Meds ordered this encounter  Medications  . amLODipine (NORVASC) 5 MG tablet    Sig: Take 1 tablet (5 mg total) by mouth daily.    Dispense:  30 tablet    Refill:  0  . modafinil (PROVIGIL) 200 MG tablet    Sig: Take 0.5-1 tablets (100-200 mg total) by mouth daily.    Dispense:  90 tablet    Refill:  1    Please run w/ GoodRx thanks       Follow-up instructions: Return for RECHECK PENDING RESULTS / IF WORSE OR CHANGE. BP RECHECK OTHERWISE IN 1 WEEK - MYCHART MESSAGE REMINDER SET - CONSIDER ALTERNATIVE ARB BESIDES LOSARTAN IF BP STILL ELEVATED, OR F/U W/ NEPHROLOGY                                          BP (!) 180/87 (BP Location: Left Arm, Patient Position: Sitting, Cuff Size: Normal)   Pulse 70   Ht 5' (1.524 m)   Wt 97 lb (44 kg)   SpO2 98%   BMI 18.94 kg/m   Current Meds  Medication Sig  . AMBULATORY NON FORMULARY MEDICATION Incontinence pads per patient preference, #unlimited, refill x99 Fax to [phone]  . amLODipine (NORVASC) 5 MG tablet Take 1 tablet (5 mg total) by mouth daily.  Marland Kitchen  arginine 500 MG tablet Take 500 mg by mouth as directed.   . Armodafinil 250 MG tablet Take 0.5 tablets (125 mg total) by mouth daily.  Marland Kitchen aspirin EC 325 MG tablet Take 1 tablet (325 mg total) by mouth daily.  . carvedilol (COREG) 6.25 MG tablet Take 1 tablet (6.25 mg total) by mouth 2 (two) times daily with a meal.  . Cholecalciferol (VITAMIN D3) 5000 UNITS TABS Take 1,000 Units by mouth daily.   . ciclopirox (PENLAC) 8 % solution Apply topically at bedtime. Apply over nail and surrounding skin. Apply daily over previous coat. After seven (7) days, may remove with alcohol and continue cycle.  . Clobetasol Propionate Emulsion 0.05 % topical foam Apply 1 application topically 2 (two) times daily.  . Continuous Blood Gluc Sensor (DEXCOM G6 SENSOR) MISC USE TO CONTINUOUSLY MONITOR BLOOD SUGARS. CHANGE EVERY 10 DAYS  .  Continuous Blood Gluc Transmit (DEXCOM G6 TRANSMITTER) MISC USE TO CONTINUOUSLY MONITOR BLOOD SUGARS. CHANGE EVERY THREE MONTHS.  Marland Kitchen cyclobenzaprine (FLEXERIL) 10 MG tablet Take 0.5-1 tablets (5-10 mg total) by mouth 3 (three) times daily as needed for muscle spasms. Caution: can cause drowsiness  . diclofenac sodium (VOLTAREN) 1 % GEL Apply 2 g topically 4 (four) times daily. To affected joint.  . diphenhydrAMINE HCl (BENADRYL ALLERGY PO) Take by mouth.  . ELDERBERRY PO Take by mouth.  . Esomeprazole Magnesium (NEXIUM PO) Take 20 mg by mouth every morning.   . fluocinonide (LIDEX) 0.05 % external solution APPLY SOLUTION TOPICALLY ONCE DAILY AS NEEDED TO SCALP  . folic acid (FOLVITE) 1 MG tablet Take by mouth.  . insulin aspart (NOVOLOG) 100 UNIT/ML injection USE VIA INSULIN PUMP TOTAL DAILY INSULIN 40 UNITS  . Insulin Human (INSULIN PUMP) SOLN Inject into the skin continuous. Novolog insulin  . ketoconazole (NIZORAL) 2 % shampoo APPLY 1 APPLICATION TOPICALLY 2 (TWO) TIMES A WEEK.  Marland Kitchen KRILL OIL PO Take by mouth.  . Linoleic Acid Conjugated 1000 MG CAPS Take 1,000 mg by mouth daily.   Marland Kitchen MELATONIN PO Take by mouth.  . nitroGLYCERIN (NITROLINGUAL) 0.4 MG/SPRAY spray Place 1 spray under the tongue every 5 (five) minutes x 3 doses as needed for chest pain.  . NON FORMULARY Chloroella  . OVER THE COUNTER MEDICATION Take 750 mg by mouth at bedtime. GABA  . OVER THE COUNTER MEDICATION as directed. Magnesium L-Treonate  . Pancrelipase, Lip-Prot-Amyl, (CREON) 3000-9500 units CPEP Take by mouth.  . spironolactone (ALDACTONE) 25 MG tablet Take by mouth.  . thyroid (ARMOUR THYROID) 60 MG tablet Take 1 tablet (60 mg total) by mouth daily before breakfast.  . torsemide ([name]) 20 MG tablet TAKE 1 TABLET ONCE DAILY INTHE MORNING. MAY TAKE AN   EXTRA 1/2 TABLET AS        NEEDED (Patient taking differently: every other day. TAKE 1 TABLET ONCE DAILY INTHE MORNING. MAY TAKE AN   EXTRA 1/2 TABLET AS NEEDED)  .  [DISCONTINUED] amLODipine (NORVASC) 5 MG tablet Take 1 tablet (5 mg total) by mouth daily.  . [DISCONTINUED] potassium chloride (KLOR-CON M10) 10 MEQ tablet Take 1 tablet (10 mEq total) by mouth 2 (two) times daily.    No results found for this or any previous visit (from the past 72 hour(s)).  No results found.  No flowsheet data found.  No flowsheet data found.    All questions at time of visit were answered - patient instructed to contact office with any additional concerns or updates.  ER/RTC precautions were  reviewed with the patient.  Please note: voice recognition software was used to produce this document, and typos may escape review. Please contact Dr. Sheppard Coil for any needed clarifications.   Total encounter time: 40 minutes.

## 2019-05-23 ENCOUNTER — Ambulatory Visit (HOSPITAL_BASED_OUTPATIENT_CLINIC_OR_DEPARTMENT_OTHER)
Admission: RE | Admit: 2019-05-23 | Discharge: 2019-05-23 | Disposition: A | Discharge status: Home / Self Care | Payer: 59 | Source: Ambulatory Visit | Attending: Osteopathic Medicine | Admitting: Osteopathic Medicine

## 2019-05-23 ENCOUNTER — Other Ambulatory Visit: Payer: Self-pay

## 2019-05-23 DIAGNOSIS — R109 Unspecified abdominal pain: Secondary | ICD-10-CM | POA: Insufficient documentation

## 2019-05-23 MED ORDER — IOHEXOL 300 MG/ML  SOLN
100.0000 mL | Freq: Once | INTRAMUSCULAR | Status: AC | PRN
  Administered 2019-05-23: 75 mL via INTRAVENOUS

## 2019-05-24 ENCOUNTER — Telehealth: Payer: Self-pay

## 2019-05-24 ENCOUNTER — Encounter: Payer: Self-pay | Admitting: *Deleted

## 2019-05-24 ENCOUNTER — Ambulatory Visit: Payer: 59 | Admitting: Family Medicine

## 2019-05-24 NOTE — Progress Notes (Deleted)
Established Patient Office Visit  Subjective:  Patient ID: Brittney Pace, female    DOB: 1955-02-06  Age: 64 y.o. MRN: [id number]  CC: No chief complaint on file.   HPI [name] presents for follow-up CT results.  Past Medical History:  Diagnosis Date  . Anemia   . Arthritis   . 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 Chronic kidney disease    stage 3  . Coronary artery disease   . Depression   . Diabetes mellitus without complication (HCC)    Type 1  . Diabetic retinopathy ([place])   . 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 Family history of adverse reaction to anesthesia    mother: " while she was under she stopped breathing."  . Fibromyalgia   . Gastroparesis   . GERD (gastroesophageal reflux disease)   . Headache    migraines  . Hypothyroidism   . IBS (irritable bowel syndrome)   . Idiopathic edema   . Lyme disease   . Mitral valve prolapse   . Myocardial infarction ([place])    1 major in 1999 and 2 minor " small vessel disease."  . Osteoporosis   . Peripheral neuropathy   . Peripheral vascular disease ([place])   . 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 ABDOMINAL HYSTERECTOMY    . APPENDECTOMY    . BREAST SURGERY     B/L biopsy and lumpectomy   . CARDIAC CATHETERIZATION    . CARPAL TUNNEL RELEASE    . CATARACT EXTRACTION W/ INTRAOCULAR LENS IMPLANT     right eye  . COLONOSCOPY W/ BIOPSIES AND POLYPECTOMY    . CORONARY ARTERY BYPASS GRAFT    . coronary artery stents     at [facility] and [place]  . ECTOPIC PREGNANCY SURGERY    . NASAL SEPTUM SURGERY    . OPEN REDUCTION INTERNAL FIXATION (ORIF) DISTAL RADIAL FRACTURE Right 05/06/2015   Procedure: OPEN REDUCTION INTERNAL FIXATION (ORIF) RIGHT DISTAL RADIAL FRACTURE AND REPAIRS AS NEEDED;  Surgeon: Iran Planas, MD;  Location: [facility];   Service: Orthopedics;  Laterality: Right;  . ORIF WRIST FRACTURE Left 11/05/2014  . ORIF WRIST FRACTURE Left 11/05/2014   Procedure: OPEN REDUCTION INTERNAL FIXATION (ORIF) LEFT WRIST FRACTURE AND REPAIR AS INDICATED;  Surgeon: Iran Planas, MD;  Location: [facility];  Service: Orthopedics;  Laterality: Left;  . TRIGGER FINGER RELEASE      Family History  Problem Relation Age of Onset  . Breast cancer Mother   . Heart disease Father   . Hypertension Father   . Breast cancer Sister     Social History   Socioeconomic History  . Marital status: Married    Spouse name: Not on file  . Number of children: 0  . Years of education: college  . Highest education level: Not on file  Occupational History  . Occupation: Retired  Tobacco Use  . Smoking status: Former Smoker    Types: Cigarettes  . Smokeless tobacco: Never Used  . Tobacco comment:  " Quit smoking cigarettes in 20's "  Substance and Sexual Activity  . Alcohol use: Yes    Comment: occasional beer or wine  . Drug use: No  . Sexual activity: Not on file  Other Topics Concern  .  Not on file  Social History Narrative   Drinks 2-4 caffeien drinks a day    Social Determinants of Health   Financial Resource Strain:   . Difficulty of Paying Living Expenses:   Food Insecurity:   . Worried About Charity fundraiser in the Last Year:   . Arboriculturist in the Last Year:   Transportation Needs:   . Film/video editor (Medical):   Marland Kitchen Lack of Transportation (Non-Medical):   Physical Activity:   . Days of Exercise per Week:   . Minutes of Exercise per Session:   Stress:   . Feeling of Stress :   Social Connections:   . Frequency of Communication with Friends and Family:   . Frequency of Social Gatherings with Friends and Family:   . Attends Religious Services:   . Active Member of Clubs or Organizations:   . Attends Archivist Meetings:   Marland Kitchen Marital Status:   Intimate Partner Violence:   . Fear of Current or  Ex-Partner:   . Emotionally Abused:   Marland Kitchen Physically Abused:   . Sexually Abused:     Outpatient Medications Prior to Visit  Medication Sig Dispense Refill  . AMBULATORY NON FORMULARY MEDICATION Incontinence pads per patient preference, #unlimited, refill x99 Fax to [phone] 1 Units prn  . amLODipine (NORVASC) 5 MG tablet Take 1 tablet (5 mg total) by mouth daily. 30 tablet 0  . arginine 500 MG tablet Take 500 mg by mouth as directed.     . Armodafinil 250 MG tablet Take 0.5 tablets (125 mg total) by mouth daily. 45 tablet 3  . aspirin EC 325 MG tablet Take 1 tablet (325 mg total) by mouth daily. 30 tablet 0  . carvedilol (COREG) 6.25 MG tablet Take 1 tablet (6.25 mg total) by mouth 2 (two) times daily with a meal. 180 tablet 3  . Cholecalciferol (VITAMIN D3) 5000 UNITS TABS Take 1,000 Units by mouth daily.     . ciclopirox (PENLAC) 8 % solution Apply topically at bedtime. Apply over nail and surrounding skin. Apply daily over previous coat. After seven (7) days, may remove with alcohol and continue cycle. 6.6 mL 0  . Clobetasol Propionate Emulsion 0.05 % topical foam Apply 1 application topically 2 (two) times daily. 100 g 1  . Continuous Blood Gluc Sensor (DEXCOM G6 SENSOR) MISC USE TO CONTINUOUSLY MONITOR BLOOD SUGARS. CHANGE EVERY 10 DAYS    . Continuous Blood Gluc Transmit (DEXCOM G6 TRANSMITTER) MISC USE TO CONTINUOUSLY MONITOR BLOOD SUGARS. CHANGE EVERY THREE MONTHS.    Marland Kitchen cyclobenzaprine (FLEXERIL) 10 MG tablet Take 0.5-1 tablets (5-10 mg total) by mouth 3 (three) times daily as needed for muscle spasms. Caution: can cause drowsiness 60 tablet 1  . diclofenac sodium (VOLTAREN) 1 % GEL Apply 2 g topically 4 (four) times daily. To affected joint. 100 g 11  . diphenhydrAMINE HCl (BENADRYL ALLERGY PO) Take by mouth.    . ELDERBERRY PO Take by mouth.    . Esomeprazole Magnesium (NEXIUM PO) Take 20 mg by mouth every morning.     . fluocinonide (LIDEX) 0.05 % external solution APPLY  SOLUTION TOPICALLY ONCE DAILY AS NEEDED TO SCALP    . folic acid (FOLVITE) 1 MG tablet Take by mouth.    . insulin aspart (NOVOLOG) 100 UNIT/ML injection USE VIA INSULIN PUMP TOTAL DAILY INSULIN 40 UNITS    . Insulin Human (INSULIN PUMP) SOLN Inject into the skin continuous. Novolog insulin    .  ketoconazole (NIZORAL) 2 % shampoo APPLY 1 APPLICATION TOPICALLY 2 (TWO) TIMES A WEEK. 240 mL 1  . KRILL OIL PO Take by mouth.    . Linoleic Acid Conjugated 1000 MG CAPS Take 1,000 mg by mouth daily.     Marland Kitchen MELATONIN PO Take by mouth.    . modafinil (PROVIGIL) 200 MG tablet Take 0.5-1 tablets (100-200 mg total) by mouth daily. 90 tablet 1  . nitroGLYCERIN (NITROLINGUAL) 0.4 MG/SPRAY spray Place 1 spray under the tongue every 5 (five) minutes x 3 doses as needed for chest pain. 12 g 1  . NON FORMULARY Chloroella    . OVER THE COUNTER MEDICATION Take 750 mg by mouth at bedtime. GABA    . OVER THE COUNTER MEDICATION as directed. Magnesium L-Treonate    . Pancrelipase, Lip-Prot-Amyl, (CREON) 3000-9500 units CPEP Take by mouth.    . spironolactone (ALDACTONE) 25 MG tablet Take by mouth.    . thyroid (ARMOUR THYROID) 60 MG tablet Take 1 tablet (60 mg total) by mouth daily before breakfast. 90 tablet 3  . torsemide ([name]) 20 MG tablet TAKE 1 TABLET ONCE DAILY INTHE MORNING. MAY TAKE AN   EXTRA 1/2 TABLET AS        NEEDED (Patient taking differently: every other day. TAKE 1 TABLET ONCE DAILY INTHE MORNING. MAY TAKE AN   EXTRA 1/2 TABLET AS NEEDED) 135 tablet 0   No facility-administered medications prior to visit.    Allergies  Allergen Reactions  . Dilaudid [Hydromorphone] Other (See Comments)    Because of continuous glucose monitor  . Morphine Anaphylaxis and Shortness Of Breath  . Morphine And Related Shortness Of Breath  . Penicillins Hives    Has patient had a PCN reaction causing immediate rash, facial/tongue/throat swelling, SOB or lightheadedness with hypotension: Yes Has patient had a PCN  reaction causing severe rash involving mucus membranes or skin necrosis: No Has patient had a PCN reaction that required hospitalization No Has patient had a PCN reaction occurring within the last 10 years: No If all of the above answers are "NO", then may proceed with Cephalosporin use.  . Tramadol Anaphylaxis  . Tramadol-Acetaminophen Other (See Comments)    Small vessel heart attack Other reaction(s): Cardiovascular Arrest (ALLERGY/intolerance), Other (See Comments)   . Acetaminophen Other (See Comments)    Alters insulin pump readings   . Amitriptyline Other (See Comments)    Severe headache/ out of body feeling  . Codeine Other (See Comments)    Severe headaches/ out of body feeling   . Gluten Meal Diarrhea and Nausea And Vomiting  . Ibuprofen Other (See Comments)    Messes up CGM reading on glucose monitor    . Losartan Cough  . Other Swelling and Other (See Comments)    Nuts - Pt stated, "I think I am allergic to [facility] Nuts"   . Propoxyphene Other (See Comments)    Severe headaches / out of body feeling   . Shellfish Allergy Diarrhea and Nausea And Vomiting  . Statins Other (See Comments)    Muscle pains Other reaction(s): Other  . Sulfamethoxazole Other (See Comments)    Mouth ulcers   . Trichophyton Itching and Other (See Comments)    (fungus) sinusitis   . Wheat Bran Diarrhea and Nausea And Vomiting    ROS Review of Systems    Objective:    Physical Exam  There were no vitals taken for this visit. Wt Readings from Last 3 Encounters:  05/20/19 97 lb (44  kg)  05/06/19 98 lb 6.4 oz (44.6 kg)  04/02/19 99 lb 6.4 oz (45.1 kg)     Health Maintenance Due  Topic Date Due  . FOOT EXAM  Never done  . URINE MICROALBUMIN  Never done  . COVID-19 Vaccine (1) Never done  . TETANUS/TDAP  Never done    There are no preventive care reminders to display for this patient.  Lab Results  Component Value Date   TSH 2.28 07/20/2018   Lab Results  Component  Value Date   WBC 6.4 05/06/2019   HGB 10.1 (L) 05/06/2019   HCT 31.5 (L) 05/06/2019   MCV 87.5 05/06/2019   PLT 300 05/06/2019   Lab Results  Component Value Date   NA 132 (L) 05/06/2019   K 3.9 05/06/2019   CO2 25 05/06/2019   GLUCOSE 160 (H) 05/06/2019   BUN 24 (H) 05/06/2019   CREATININE 1.07 (H) 05/06/2019   BILITOT 0.3 05/06/2019   ALKPHOS 78 05/06/2019   AST 21 05/06/2019   ALT 12 05/06/2019   PROT 6.0 (L) 05/06/2019   ALBUMIN 3.8 05/06/2019   CALCIUM 9.0 05/06/2019   ANIONGAP 8 05/06/2019   Lab Results  Component Value Date   CHOL 204 (H) 07/20/2018   Lab Results  Component Value Date   HDL 62 07/20/2018   Lab Results  Component Value Date   LDLCALC 116 (H) 07/20/2018   Lab Results  Component Value Date   TRIG 138 07/20/2018   Lab Results  Component Value Date   CHOLHDL 3.3 07/20/2018   Lab Results  Component Value Date   HGBA1C 8.0 (H) 07/17/2017      Assessment & Plan:   Problem List Items Addressed This Visit    None      No orders of the defined types were placed in this encounter.   Follow-up: No follow-ups on file.    Beatrice Lecher, MD

## 2019-05-24 NOTE — Telephone Encounter (Signed)
Brittney Pace states she was wanting someone to explain the CT results to her in layman's terms. She was scheduled for today and was unable to get here on time. Please advise.   CLINICAL DATA:  64 year old female with history of suspected diverticulitis. Diarrhea. History of gastroparesis.  EXAM: CT ABDOMEN AND PELVIS WITH CONTRAST  TECHNIQUE: Multidetector CT imaging of the abdomen and pelvis was performed using the standard protocol following bolus administration of intravenous contrast.  CONTRAST:  55mL OMNIPAQUE IOHEXOL 300 MG/ML  SOLN  COMPARISON:  No priors.  FINDINGS: Lower chest: Atherosclerotic calcifications in the left circumflex and right coronary arteries.  Hepatobiliary: Subcentimeter ill-defined low-attenuation lesion in segment 7 of the liver, too small to characterize and nonspecific. No other larger more suspicious appearing hepatic lesions are noted. No intra or extrahepatic biliary ductal dilatation. Gallbladder is normal in appearance.  Pancreas: No pancreatic mass. No pancreatic ductal dilatation. No pancreatic or peripancreatic fluid collections or inflammatory changes.  Spleen: Unremarkable.  Adrenals/Urinary Tract: Multiple low-attenuation lesions in the right kidney, compatible with simple cysts, largest of which measures 2.5 cm in the upper pole of the right kidney. Other subcentimeter low-attenuation lesions in both kidneys are too small to definitively characterize, but are statistically likely to represent cysts. In addition, in the medial aspect of the upper pole of the left kidney there is a 1.9 x 1.4 cm intermediate to high attenuation lesion (axial image 18 of series 2). No hydroureteronephrosis. Urinary bladder is nearly decompressed, but otherwise normal in appearance.  Stomach/Bowel: Normal appearance of the stomach. No pathologic dilatation of small bowel or colon. The appendix is not confidently identified and may be surgically  absent. Regardless, there are no inflammatory changes noted adjacent to the cecum to suggest the presence of an acute appendicitis at this time.  Vascular/Lymphatic: Aortic atherosclerosis, without evidence of aneurysm or dissection in the abdominal or pelvic vasculature. No lymphadenopathy noted in the abdomen or pelvis.  Reproductive: Status post hysterectomy. Ovaries are not confidently identified may be surgically absent or atrophic  Other: No significant volume of ascites.  No pneumoperitoneum.  Musculoskeletal: There are no aggressive appearing lytic or blastic lesions noted in the visualized portions of the skeleton.  IMPRESSION: 1. No acute findings are noted in the abdomen or pelvis to account for the patient's symptoms. Specifically, no evidence of acute diverticulitis. 2. Indeterminate but suspicious lesion in the upper pole of the right kidney which could represent a small renal neoplasm. Further evaluation with nonemergent abdominal MRI with and without IV gadolinium is strongly recommended in the near future to exclude the possibility of a small renal cell carcinoma. 3. Aortic atherosclerosis, in addition to least 2 vessel coronary artery disease. Please note that although the presence of coronary artery calcium documents the presence of coronary artery disease, the severity of this disease and any potential stenosis cannot be assessed on this non-gated CT examination. Assessment for potential risk factor modification, dietary therapy or pharmacologic therapy may be warranted, if clinically indicated. 4. Additional incidental findings, as above.   Electronically Signed   By: [name] M.D.   On: 05/24/2019 08:59

## 2019-05-27 ENCOUNTER — Other Ambulatory Visit: Payer: Self-pay | Admitting: Family

## 2019-05-27 NOTE — Telephone Encounter (Signed)
Patient was made same-day appointment to go over her results in detail with me.  Patient called about an hour before her appointment and canceled.

## 2019-05-29 LAB — FECAL LACTOFERRIN, QUANT
Fecal Lactoferrin: NEGATIVE
MICRO NUMBER:: 10420885
SPECIMEN QUALITY:: ADEQUATE

## 2019-05-29 LAB — C. DIFFICILE GDH AND TOXIN A/B
GDH ANTIGEN: NOT DETECTED
MICRO NUMBER:: 10420883
SPECIMEN QUALITY:: ADEQUATE
TOXIN A AND B: NOT DETECTED

## 2019-05-29 LAB — STOOL CULTURE
MICRO NUMBER:: 10420882
MICRO NUMBER:: 10420884
MICRO NUMBER:: 10420887
SHIGA RESULT:: NOT DETECTED
SPECIMEN QUALITY:: ADEQUATE
SPECIMEN QUALITY:: ADEQUATE
SPECIMEN QUALITY:: ADEQUATE

## 2019-05-29 LAB — OVA AND PARASITE EXAMINATION
CONCENTRATE RESULT:: NONE SEEN
MICRO NUMBER:: 10420886
SPECIMEN QUALITY:: ADEQUATE
TRICHROME RESULT:: NONE SEEN

## 2019-06-03 ENCOUNTER — Other Ambulatory Visit: Payer: Self-pay

## 2019-06-03 ENCOUNTER — Encounter: Payer: Self-pay | Admitting: Family

## 2019-06-03 ENCOUNTER — Inpatient Hospital Stay (HOSPITAL_BASED_OUTPATIENT_CLINIC_OR_DEPARTMENT_OTHER): Payer: 59 | Admitting: Family

## 2019-06-03 ENCOUNTER — Inpatient Hospital Stay: Payer: 59

## 2019-06-03 ENCOUNTER — Inpatient Hospital Stay: Payer: 59 | Attending: Hematology & Oncology

## 2019-06-03 VITALS — BP 199/89 | HR 60 | Temp 96.9°F | Resp 18 | Ht 60.0 in | Wt 99.1 lb

## 2019-06-03 DIAGNOSIS — D631 Anemia in chronic kidney disease: Secondary | ICD-10-CM

## 2019-06-03 DIAGNOSIS — E1122 Type 2 diabetes mellitus with diabetic chronic kidney disease: Secondary | ICD-10-CM | POA: Diagnosis not present

## 2019-06-03 DIAGNOSIS — Z79899 Other long term (current) drug therapy: Secondary | ICD-10-CM | POA: Diagnosis not present

## 2019-06-03 DIAGNOSIS — I2510 Atherosclerotic heart disease of native coronary artery without angina pectoris: Secondary | ICD-10-CM

## 2019-06-03 DIAGNOSIS — N189 Chronic kidney disease, unspecified: Secondary | ICD-10-CM | POA: Diagnosis present

## 2019-06-03 DIAGNOSIS — Z794 Long term (current) use of insulin: Secondary | ICD-10-CM | POA: Diagnosis not present

## 2019-06-03 LAB — CMP (CANCER CENTER ONLY)
ALT: 14 U/L (ref 0–44)
AST: 22 U/L (ref 15–41)
Albumin: 3.9 g/dL (ref 3.5–5.0)
Alkaline Phosphatase: 68 U/L (ref 38–126)
Anion gap: 7 (ref 5–15)
BUN: 18 mg/dL (ref 8–23)
CO2: 27 mmol/L (ref 22–32)
Calcium: 9.4 mg/dL (ref 8.9–10.3)
Chloride: 97 mmol/L — ABNORMAL LOW (ref 98–111)
Creatinine: 1.19 mg/dL — ABNORMAL HIGH (ref 0.44–1.00)
GFR, Est AFR Am: 56 mL/min — ABNORMAL LOW (ref >60)
GFR, Estimated: 48 mL/min — ABNORMAL LOW (ref >60)
Glucose, Bld: 181 mg/dL — ABNORMAL HIGH (ref 70–99)
Potassium: 4.6 mmol/L (ref 3.5–5.1)
Sodium: 131 mmol/L — ABNORMAL LOW (ref 135–145)
Total Bilirubin: 0.4 mg/dL (ref 0.3–1.2)
Total Protein: 6.2 g/dL — ABNORMAL LOW (ref 6.5–8.1)

## 2019-06-03 LAB — CBC WITH DIFFERENTIAL (CANCER CENTER ONLY)
Abs Immature Granulocytes: 0.01 10*3/uL (ref 0.00–0.07)
Basophils Absolute: 0.1 10*3/uL (ref 0.0–0.1)
Basophils Relative: 1 %
Eosinophils Absolute: 0.3 10*3/uL (ref 0.0–0.5)
Eosinophils Relative: 6 %
HCT: 31.7 % — ABNORMAL LOW (ref 36.0–46.0)
Hemoglobin: 10.2 g/dL — ABNORMAL LOW (ref 12.0–15.0)
Immature Granulocytes: 0 %
Lymphocytes Relative: 30 %
Lymphs Abs: 1.8 10*3/uL (ref 0.7–4.0)
MCH: 28 pg (ref 26.0–34.0)
MCHC: 32.2 g/dL (ref 30.0–36.0)
MCV: 87.1 fL (ref 80.0–100.0)
Monocytes Absolute: 0.6 10*3/uL (ref 0.1–1.0)
Monocytes Relative: 10 %
Neutro Abs: 3.1 10*3/uL (ref 1.7–7.7)
Neutrophils Relative %: 53 %
Platelet Count: 319 10*3/uL (ref 150–400)
RBC: 3.64 MIL/uL — ABNORMAL LOW (ref 3.87–5.11)
RDW: 13.2 % (ref 11.5–15.5)
WBC Count: 5.8 10*3/uL (ref 4.0–10.5)
nRBC: 0 % (ref 0.0–0.2)

## 2019-06-03 LAB — MICROALBUMIN, URINE
Microalb, Ur: 6748
Microalb, Ur: 6748
Microalb, Ur: 6748

## 2019-06-03 NOTE — Progress Notes (Signed)
Hematology and Oncology Follow Up Visit  Brittney Pace [id number] 10-18-1955 64 y.o. 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 Unfortunately she is still having issues with uncontrolled BP. She states that she developed swelling on Norvasc and was recently changed to Spironolactone along with the Carvedilol.  BP today is 199/98, HR 60.  She states that she keeps a headache and feels lightheaded most days. She has mild SOB with any exertion.  She also has noted "brain fog".  Hgb is stable at 10.2, MCV 87.  No fever, chills, n/v, cough, rash, chest pain, palpitations, abdominal pain or changes in bowel or bladder habits.  No swelling or tenderness in her extremities at this time.  The numbness and tingling in her hands and feet is stable and unchanged.  No falls or syncopal episodes to report.  She is eating well and doing her best to hydrate appropriately. Her weight is stable.   ECOG Performance Status: 1 - Symptomatic but completely ambulatory  Medications:  Allergies as of 06/03/2019      Reactions   Dilaudid [hydromorphone] Other (See Comments)   Because of continuous glucose monitor   Morphine Anaphylaxis, Shortness Of Breath   Morphine And Related Shortness Of Breath   Penicillins Hives   Has patient had a PCN reaction causing immediate rash, facial/tongue/throat swelling, SOB or lightheadedness with hypotension: Yes Has patient had a PCN reaction causing severe rash involving mucus membranes or skin necrosis: No Has patient had a PCN reaction that required hospitalization No Has patient had a PCN reaction occurring within the last 10 years: No If all of the above answers are "NO", then may proceed with Cephalosporin use.   Tramadol Anaphylaxis     Tramadol-acetaminophen Other (See Comments)   Small vessel heart attack Other reaction(s): Cardiovascular Arrest (ALLERGY/intolerance), Other (See Comments)   Acetaminophen Other (See Comments)   Alters insulin pump readings   Amitriptyline Other (See Comments)   Severe headache/ out of body feeling   Codeine Other (See Comments)   Severe headaches/ out of body feeling   Gluten Meal Diarrhea, Nausea And Vomiting   Ibuprofen Other (See Comments)   Messes up CGM reading on glucose monitor   Losartan Cough   Other Swelling, Other (See Comments)   Nuts - Pt stated, "I think I am allergic to [facility] Nuts"   Propoxyphene Other (See Comments)   Severe headaches / out of body feeling   Shellfish Allergy Diarrhea, Nausea And Vomiting   Statins Other (See Comments)   Muscle pains Other reaction(s): Other   Sulfamethoxazole Other (See Comments)   Mouth ulcers   Trichophyton Itching, Other (See Comments)   (fungus) sinusitis   Wheat Bran Diarrhea, Nausea And Vomiting      Medication List       Accurate as of Jun 03, 2019 10:52 AM. If you have any questions, ask your nurse or doctor.        AMBULATORY NON FORMULARY MEDICATION Incontinence pads per patient preference, #unlimited, refill x99 Fax to [phone]   amLODipine 5 MG tablet Commonly known as: NORVASC Take 1 tablet (5 mg total) by mouth daily.   arginine 500 MG tablet Take 500 mg by mouth as directed.   Armodafinil 250 MG tablet Take 0.5 tablets (125 mg total) by mouth  daily.   aspirin EC 325 MG tablet Take 1 tablet (325 mg total) by mouth daily.   BENADRYL ALLERGY PO Take by mouth.   carvedilol 6.25 MG tablet Commonly known as: COREG Take 1 tablet (6.25 mg total) by mouth 2 (two) times daily with a meal.   ciclopirox 8 % solution Commonly known as: PENLAC Apply topically at bedtime. Apply over nail and surrounding skin. Apply daily over previous coat. After seven (7) days, may remove with alcohol and  continue cycle.   Clobetasol Propionate Emulsion 0.05 % topical foam Apply 1 application topically 2 (two) times daily.   Creon 3000-9500 units Cpep Generic drug: Pancrelipase (Lip-Prot-Amyl) Take by mouth.   cyclobenzaprine 10 MG tablet Commonly known as: FLEXERIL Take 0.5-1 tablets (5-10 mg total) by mouth 3 (three) times daily as needed for muscle spasms. Caution: can cause drowsiness   Dexcom G6 Sensor Misc USE TO CONTINUOUSLY MONITOR BLOOD SUGARS. CHANGE EVERY 10 DAYS   Dexcom G6 Transmitter Misc USE TO CONTINUOUSLY MONITOR BLOOD SUGARS. CHANGE EVERY THREE MONTHS.   diclofenac sodium 1 % Gel Commonly known as: VOLTAREN Apply 2 g topically 4 (four) times daily. To affected joint.   ELDERBERRY PO Take by mouth.   fluocinonide 0.05 % external solution Commonly known as: LIDEX APPLY SOLUTION TOPICALLY ONCE DAILY AS NEEDED TO SCALP   folic acid 1 MG tablet Commonly known as: FOLVITE Take by mouth.   insulin aspart 100 UNIT/ML injection Commonly known as: novoLOG USE VIA INSULIN PUMP TOTAL DAILY INSULIN 40 UNITS   insulin pump Soln Inject into the skin continuous. Novolog insulin   ketoconazole 2 % shampoo Commonly known as: NIZORAL APPLY 1 APPLICATION TOPICALLY 2 (TWO) TIMES A WEEK.   KRILL OIL PO Take by mouth.   Linoleic Acid Conjugated 1000 MG Caps Take 1,000 mg by mouth daily.   MELATONIN PO Take by mouth.   modafinil 200 MG tablet Commonly known as: PROVIGIL Take 0.5-1 tablets (100-200 mg total) by mouth daily.   NEXIUM PO Take 20 mg by mouth every morning.   nitroGLYCERIN 0.4 MG/SPRAY spray Commonly known as: NITROLINGUAL Place 1 spray under the tongue every 5 (five) minutes x 3 doses as needed for chest pain.   NON FORMULARY Chloroella   OVER THE COUNTER MEDICATION Take 750 mg by mouth at bedtime. GABA   OVER THE COUNTER MEDICATION as directed. Magnesium L-Treonate   spironolactone 25 MG tablet Commonly known as: ALDACTONE Take by  mouth.   thyroid 60 MG tablet Commonly known as: Armour Thyroid Take 1 tablet (60 mg total) by mouth daily before breakfast.   torsemide 20 MG tablet Commonly known as: [name] TAKE 1 TABLET ONCE DAILY INTHE MORNING. MAY TAKE AN   EXTRA 1/2 TABLET AS        NEEDED What changed: See the new instructions.   Vitamin D3 125 MCG (5000 UT) Tabs Take 1,000 Units by mouth daily.       Allergies:  Allergies  Allergen Reactions  . Dilaudid [Hydromorphone] Other (See Comments)    Because of continuous glucose monitor  . Morphine Anaphylaxis and Shortness Of Breath  . Morphine And Related Shortness Of Breath  . Penicillins Hives    Has patient had a PCN reaction causing immediate rash, facial/tongue/throat swelling, SOB or lightheadedness with hypotension: Yes Has patient had a PCN reaction causing severe rash involving mucus membranes or skin necrosis: No Has patient had a PCN reaction that required hospitalization No Has patient had a PCN reaction  occurring within the last 10 years: No If all of the above answers are "NO", then may proceed with Cephalosporin use.  . Tramadol Anaphylaxis  . Tramadol-Acetaminophen Other (See Comments)    Small vessel heart attack Other reaction(s): Cardiovascular Arrest (ALLERGY/intolerance), Other (See Comments)   . Acetaminophen Other (See Comments)    Alters insulin pump readings   . Amitriptyline Other (See Comments)    Severe headache/ out of body feeling  . Codeine Other (See Comments)    Severe headaches/ out of body feeling   . Gluten Meal Diarrhea and Nausea And Vomiting  . Ibuprofen Other (See Comments)    Messes up CGM reading on glucose monitor    . Losartan Cough  . Other Swelling and Other (See Comments)    Nuts - Pt stated, "I think I am allergic to [facility] Nuts"   . Propoxyphene Other (See Comments)    Severe headaches / out of body feeling   . Shellfish Allergy Diarrhea and Nausea And Vomiting  . Statins Other (See Comments)     Muscle pains Other reaction(s): Other  . Sulfamethoxazole Other (See Comments)    Mouth ulcers   . Trichophyton Itching and Other (See Comments)    (fungus) sinusitis   . Wheat Bran Diarrhea and Nausea And Vomiting    Past Medical History, Surgical history, Social history, and Family History were reviewed and updated.  Review of Systems: All other 10 point review of systems is negative.   Physical Exam:  vitals were not taken for this visit.   Wt Readings from Last 3 Encounters:  05/20/19 97 lb (44 kg)  05/06/19 98 lb 6.4 oz (44.6 kg)  04/02/19 99 lb 6.4 oz (45.1 kg)    Ocular: Sclerae unicteric, pupils equal, round and reactive to light Ear-nose-throat: Oropharynx clear, dentition fair Lymphatic: No cervical or supraclavicular adenopathy Lungs no rales or rhonchi, good excursion bilaterally Heart regular rate and rhythm, no murmur appreciated Abd soft, nontender, positive bowel sounds, no liver or spleen tip palpated on exam, no fluid wave  MSK no focal spinal tenderness, no joint edema Neuro: non-focal, well-oriented, appropriate affect Breasts: Deferred   Lab Results  Component Value Date   WBC 6.4 05/06/2019   HGB 10.1 (L) 05/06/2019   HCT 31.5 (L) 05/06/2019   MCV 87.5 05/06/2019   PLT 300 05/06/2019   Lab Results  Component Value Date   FERRITIN 57 05/06/2019   IRON 68 05/06/2019   TIBC 316 05/06/2019   UIBC 249 05/06/2019   IRONPCTSAT 21 05/06/2019   Lab Results  Component Value Date   RETICCTPCT 0.9 05/06/2019   RBC 3.59 (L) 05/06/2019   No results found for: KPAFRELGTCHN, LAMBDASER, KAPLAMBRATIO No results found for: IGGSERUM, IGA, IGMSERUM No results found for: Ronnald Ramp, A1GS, A2GS, Violet Baldy, MSPIKE, SPEI   Chemistry      Component Value Date/Time   NA 132 (L) 05/06/2019 1316   NA 138 07/17/2017 1128   K 3.9 05/06/2019 1316   CL 99 05/06/2019 1316   CO2 25 05/06/2019 1316   BUN 24 (H) 05/06/2019 1316   BUN  23 07/17/2017 1128   CREATININE 1.07 (H) 05/06/2019 1316   CREATININE 0.99 07/20/2018 0821      Component Value Date/Time   CALCIUM 9.0 05/06/2019 1316   ALKPHOS 78 05/06/2019 1316   AST 21 05/06/2019 1316   ALT 12 05/06/2019 1316   BILITOT 0.3 05/06/2019 1316       Impression and Plan:  Brittney Pace is a very pleasant 64 yo female with multifactorial anemia. Unfortunately, due to the uncontrolled HTN we are unable to give her Aranesp.  I will forward today's note and lab work to her Nephrologist who she states is managing her BP medications.  She will also give them a call to follow-up as well.  We will plan to see her again in 4 weeks. Hopefully by then her numbers will have improved.  She is in agreement and will contact our office with any questions or concerns. We can certainly see her sooner if needed.   Laverna Peace, NP 5/10/202110:52 AM

## 2019-06-04 NOTE — Telephone Encounter (Signed)
BP looks good Confirm w/ Abigail Butts re: no-show

## 2019-06-05 ENCOUNTER — Other Ambulatory Visit: Payer: Self-pay | Admitting: Family

## 2019-06-06 NOTE — Progress Notes (Signed)
HPI: FU CAD. Patient previously followed at [facility]; H/O CABG (LIMA to LAD; SVG to RCA); According to the patient, she had 2 stents to the LIMA graft and the LAD in 2000. She's had EECP treatments in [place]. Her last heart catheterization was August 2015 at [facility] which revealed two-vessel coronary disease with a patent LIMA to the LAD and SVG to the RCA. No obstructive disease in left circumflex; EF 65.Last echo January 2020 showed normal LV function, systolic bowing of mitral valve without frank prolapse. Also with history of stroke. Since last seen, she has some dyspnea and continues to have occasional chest pain relieved with nitroglycerin which is unchanged.  No pedal edema or syncope.  Current Outpatient Medications  Medication Sig Dispense Refill  . AMBULATORY NON FORMULARY MEDICATION Incontinence pads per patient preference, #unlimited, refill x99 Fax to [phone] 1 Units prn  . arginine 500 MG tablet Take 500 mg by mouth as directed.     . Armodafinil 250 MG tablet Take 0.5 tablets (125 mg total) by mouth daily. 45 tablet 3  . aspirin EC 325 MG tablet Take 1 tablet (325 mg total) by mouth daily. 30 tablet 0  . carvedilol (COREG) 6.25 MG tablet Take 1 tablet (6.25 mg total) by mouth 2 (two) times daily with a meal. 180 tablet 3  . Cholecalciferol (VITAMIN D3) 5000 UNITS TABS Take 1,000 Units by mouth daily.     . ciclopirox (PENLAC) 8 % solution Apply topically at bedtime. Apply over nail and surrounding skin. Apply daily over previous coat. After seven (7) days, may remove with alcohol and continue cycle. 6.6 mL 0  . Clobetasol Propionate Emulsion 0.05 % topical foam Apply 1 application topically 2 (two) times daily. 100 g 1  . Continuous Blood Gluc Sensor (DEXCOM G6 SENSOR) MISC USE TO CONTINUOUSLY MONITOR BLOOD SUGARS. CHANGE EVERY 10 DAYS    . Continuous Blood Gluc Transmit (DEXCOM G6 TRANSMITTER) MISC USE TO CONTINUOUSLY MONITOR BLOOD  SUGARS. CHANGE EVERY THREE MONTHS.    Marland Kitchen cyclobenzaprine (FLEXERIL) 10 MG tablet Take 0.5-1 tablets (5-10 mg total) by mouth 3 (three) times daily as needed for muscle spasms. Caution: can cause drowsiness 60 tablet 1  . diclofenac sodium (VOLTAREN) 1 % GEL Apply 2 g topically 4 (four) times daily. To affected joint. 100 g 11  . diphenhydrAMINE HCl (BENADRYL ALLERGY PO) Take by mouth.    . ELDERBERRY PO Take by mouth.    . Esomeprazole Magnesium (NEXIUM PO) Take 20 mg by mouth every morning.     . fluocinonide (LIDEX) 0.05 % external solution APPLY SOLUTION TOPICALLY ONCE DAILY AS NEEDED TO SCALP    . folic acid (FOLVITE) 1 MG tablet Take by mouth.    . insulin aspart (NOVOLOG) 100 UNIT/ML injection USE VIA INSULIN PUMP TOTAL DAILY INSULIN 40 UNITS    . Insulin Human (INSULIN PUMP) SOLN Inject into the skin continuous. Novolog insulin    . [START ON 06/13/2019] ketoconazole (NIZORAL) 2 % shampoo APPLY 1 APPLICATION TOPICALLY 2 (TWO) TIMES A WEEK. 240 mL 1  . KRILL OIL PO Take by mouth.    . Linoleic Acid Conjugated 1000 MG CAPS Take 1,000 mg by mouth daily.     Marland Kitchen MELATONIN PO Take by mouth.    . modafinil (PROVIGIL) 200 MG tablet Take 0.5-1 tablets (100-200 mg total) by mouth daily. 90 tablet 1  . nitroGLYCERIN (NITROLINGUAL) 0.4 MG/SPRAY spray Place 1 spray under the tongue  every 5 (five) minutes x 3 doses as needed for chest pain. 12 g 1  . NON FORMULARY Chloroella    . OVER THE COUNTER MEDICATION Take 750 mg by mouth at bedtime. GABA    . OVER THE COUNTER MEDICATION as directed. Magnesium L-Treonate    . Pancrelipase, Lip-Prot-Amyl, (CREON) 3000-9500 units CPEP Take by mouth.    . rifaximin (XIFAXAN) 550 MG TABS tablet Take by mouth.    . spironolactone (ALDACTONE) 25 MG tablet Take by mouth.    . thyroid (ARMOUR THYROID) 60 MG tablet Take 1 tablet (60 mg total) by mouth daily before breakfast. 90 tablet 3  . torsemide ([name]) 20 MG tablet TAKE 1 TABLET ONCE DAILY INTHE MORNING. MAY TAKE AN    EXTRA 1/2 TABLET AS        NEEDED (Patient taking differently: every other day. TAKE 1 TABLET ONCE DAILY INTHE MORNING. MAY TAKE AN   EXTRA 1/2 TABLET AS NEEDED) 135 tablet 0   No current facility-administered medications for this visit.     Past Medical History:  Diagnosis Date  . Anemia   . Arthritis   . 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 Chronic kidney disease    stage 3  . Coronary artery disease   . Depression   . Diabetes mellitus without complication (HCC)    Type 1  . Diabetic retinopathy ([place])   . 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 Family history of adverse reaction to anesthesia    mother: " while she was under she stopped breathing."  . Fibromyalgia   . Gastroparesis   . GERD (gastroesophageal reflux disease)   . Headache    migraines  . Hypothyroidism   . IBS (irritable bowel syndrome)   . Idiopathic edema   . Lyme disease   . Mitral valve prolapse   . Myocardial infarction ([place])    1 major in 1999 and 2 minor " small vessel disease."  . Osteoporosis   . Peripheral neuropathy   . Peripheral vascular disease ([place])   . 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 ABDOMINAL HYSTERECTOMY    . APPENDECTOMY    . BREAST SURGERY     B/L biopsy and lumpectomy   . CARDIAC CATHETERIZATION    . CARPAL TUNNEL RELEASE    . CATARACT EXTRACTION W/ INTRAOCULAR LENS IMPLANT     right eye  . COLONOSCOPY W/ BIOPSIES AND POLYPECTOMY    . CORONARY ARTERY BYPASS GRAFT    . coronary artery stents     at LAD and LIMA  . ECTOPIC PREGNANCY SURGERY    . NASAL SEPTUM SURGERY    . OPEN REDUCTION INTERNAL FIXATION (ORIF) DISTAL RADIAL FRACTURE Right 05/06/2015   Procedure: OPEN REDUCTION INTERNAL FIXATION (ORIF) RIGHT DISTAL RADIAL FRACTURE AND REPAIRS AS NEEDED;  Surgeon: Iran Planas, MD;   Location: [place];  Service: Orthopedics;  Laterality: Right;  . ORIF WRIST FRACTURE Left 11/05/2014  . ORIF WRIST FRACTURE Left 11/05/2014   Procedure: OPEN REDUCTION INTERNAL FIXATION (ORIF) LEFT WRIST FRACTURE AND REPAIR AS INDICATED;  Surgeon: Iran Planas, MD;  Location: [place];  Service: Orthopedics;  Laterality: Left;  . TRIGGER FINGER RELEASE      Social History   Socioeconomic History  .  Marital status: Married    Spouse name: Not on file  . Number of children: 0  . Years of education: college  . Highest education level: Not on file  Occupational History  . Occupation: Retired  Tobacco Use  . Smoking status: Former Smoker    Types: Cigarettes  . Smokeless tobacco: Never Used  . Tobacco comment:  " Quit smoking cigarettes in 20's "  Substance and Sexual Activity  . Alcohol use: Yes    Comment: occasional beer or wine  . Drug use: No  . Sexual activity: Not on file  Other Topics Concern  . Not on file  Social History Narrative   Drinks 2-4 caffeien drinks a day    Social Determinants of Health   Financial Resource Strain:   . Difficulty of Paying Living Expenses:   Food Insecurity:   . Worried About Charity fundraiser in the Last Year:   . Arboriculturist in the Last Year:   Transportation Needs:   . Film/video editor (Medical):   Marland Kitchen Lack of Transportation (Non-Medical):   Physical Activity:   . Days of Exercise per Week:   . Minutes of Exercise per Session:   Stress:   . Feeling of Stress :   Social Connections:   . Frequency of Communication with Friends and Family:   . Frequency of Social Gatherings with Friends and Family:   . Attends Religious Services:   . Active Member of Clubs or Organizations:   . Attends Archivist Meetings:   Marland Kitchen Marital Status:   Intimate Partner Violence:   . Fear of Current or Ex-Partner:   . Emotionally Abused:   Marland Kitchen Physically Abused:   . Sexually Abused:     Family History  Problem Relation Age of Onset  .  Breast cancer Mother   . Heart disease Father   . Hypertension Father   . Breast cancer Sister     ROS: Generalized pruritus but no fevers or chills, productive cough, hemoptysis, dysphasia, odynophagia, melena, hematochezia, dysuria, hematuria, rash, seizure activity, orthopnea, PND, pedal edema, claudication. Remaining systems are negative.  Physical Exam: Well-developed well-nourished in no acute distress.  Skin is warm and dry.  HEENT is normal.  Neck is supple.  Chest is clear to auscultation with normal expansion.  Cardiovascular exam is regular rate and rhythm.  Abdominal exam nontender or distended. No masses palpated. Extremities show no edema. neuro grossly intact   A/P  1 coronary artery disease-plan to continue aspirin.  She is intolerant to statins.  She has had atypical chest pain.  She is trying to reestablish in [place] for EECP.  She declines nuclear study at this point.  2 chronic diastolic congestive heart failure-volume status has improved since resuming [name].  We will continue as needed.  3 hypertension-blood pressure elevated; increase carvedilol to 12.5 mg twice daily and follow blood pressure.  4 hyperlipidemia-patient is intolerant to statins and Zetia.  She declines Repatha.  5 diabetes mellitus-Per primary care.  Kirk Ruths, MD

## 2019-06-11 ENCOUNTER — Other Ambulatory Visit: Payer: Self-pay | Admitting: Osteopathic Medicine

## 2019-06-12 ENCOUNTER — Encounter: Payer: Self-pay | Admitting: Cardiology

## 2019-06-12 ENCOUNTER — Ambulatory Visit (INDEPENDENT_AMBULATORY_CARE_PROVIDER_SITE_OTHER): Payer: 59 | Admitting: Cardiology

## 2019-06-12 ENCOUNTER — Other Ambulatory Visit: Payer: Self-pay

## 2019-06-12 VITALS — BP 162/84 | HR 68 | Ht 60.0 in | Wt 98.8 lb

## 2019-06-12 DIAGNOSIS — I2510 Atherosclerotic heart disease of native coronary artery without angina pectoris: Secondary | ICD-10-CM

## 2019-06-12 DIAGNOSIS — I5032 Chronic diastolic (congestive) heart failure: Secondary | ICD-10-CM | POA: Diagnosis not present

## 2019-06-12 DIAGNOSIS — I10 Essential (primary) hypertension: Secondary | ICD-10-CM | POA: Diagnosis not present

## 2019-06-12 DIAGNOSIS — R072 Precordial pain: Secondary | ICD-10-CM | POA: Diagnosis not present

## 2019-06-12 MED ORDER — CARVEDILOL 12.5 MG PO TABS: 12.5000 mg | ORAL_TABLET | Freq: Two times a day (BID) | ORAL | 3 refills | Status: DC

## 2019-06-12 NOTE — Patient Instructions (Signed)
Medication Instructions:   INCREASE CARVEDILOL TO 12.5 MG TWICE DAILY= 2 OF THE 6.25 MG TWICE DAILY  *If you need a refill on your cardiac medications before your next appointment, please call your pharmacy*   Lab Work: If you have labs (blood work) drawn today and your tests are completely normal, you will receive your results only by: Marland Kitchen MyChart Message (if you have MyChart) OR . A paper copy in the mail If you have any lab test that is abnormal or we need to change your treatment, we will call you to review the results.   Follow-Up: At [facility], you and your health needs are our priority.  As part of our continuing mission to provide you with exceptional heart care, we have created designated Provider Care Teams.  These Care Teams include your primary Cardiologist (physician) and Advanced Practice Providers (APPs -  Physician Assistants and Nurse Practitioners) who all work together to provide you with the care you need, when you need it.  We recommend signing up for the patient portal called "MyChart".  Sign up information is provided on this After Visit Summary.  MyChart is used to connect with patients for Virtual Visits (Telemedicine).  Patients are able to view lab/test results, encounter notes, upcoming appointments, etc.  Non-urgent messages can be sent to your provider as well.   To learn more about what you can do with MyChart, go to [email or web address].    Your next appointment:   6 month(s)  The format for your next appointment:   Either In Person or Virtual  Provider:   Kirk Ruths, MD

## 2019-06-14 ENCOUNTER — Telehealth: Payer: Self-pay | Admitting: Osteopathic Medicine

## 2019-06-14 ENCOUNTER — Encounter: Payer: Self-pay | Admitting: Osteopathic Medicine

## 2019-06-14 DIAGNOSIS — N2889 Other specified disorders of kidney and ureter: Secondary | ICD-10-CM

## 2019-06-14 NOTE — Telephone Encounter (Signed)
I looked over the CT again Nothing to explain GI symptoms There was a small area on the kidney that was of concern, I had forgotten to order an MRI to take a closer look. This is not something that would explain symptoms, though. I ordered the MRI. There were other abnormalities on the CT scan - we would not expect a perfectly normal scan given all her other issues.

## 2019-06-14 NOTE — Telephone Encounter (Signed)
Patient advised of recommendations. She did want Dr Sheppard Coil know that the GI provider started her on a very expensive antibiotic.

## 2019-06-20 ENCOUNTER — Telehealth (INDEPENDENT_AMBULATORY_CARE_PROVIDER_SITE_OTHER): Payer: 59 | Admitting: Osteopathic Medicine

## 2019-06-20 DIAGNOSIS — L508 Other urticaria: Secondary | ICD-10-CM | POA: Diagnosis not present

## 2019-06-20 DIAGNOSIS — R197 Diarrhea, unspecified: Secondary | ICD-10-CM

## 2019-06-26 MED ORDER — GABAPENTIN 300 MG PO CAPS: 300.0000 mg | ORAL_CAPSULE | Freq: Three times a day (TID) | ORAL | 5 refills | Status: DC | PRN

## 2019-06-26 NOTE — Progress Notes (Signed)
Virtual Visit via phone I connected with      Brittney Pace On 06/20/2019 at 1 pm by a telemedicine application and verified that I am speaking with the correct person using two identifiers.  Patient is at home I am in office   I discussed the limitations of evaluation and management by telemedicine and the availability of in person appointments. The patient expressed understanding and agreed to proceed.  History of Present Illness: Brittney Pace is a 64 y.o. female who would like to discuss go over CT scan findings, needs refill gabapentin.   MRI pending for concerning renal mass.  Patient also had some concerns about bladder decompression, advised this was nothing to worry about.  Patient has been taking pancreatic enzymes, helping the diarrhea  Significant itching problems, has been alternating Benadryl and Zyrtec.  Advised taking Zyrtec daily, nightly  Needs refill on gabapentin.  Observations/Objective: There were no vitals taken for this visit. BP Readings from Last 3 Encounters:  06/12/19 (!) 162/84  06/03/19 (!) 199/89  05/20/19 (!) 180/87   Exam: Normal Speech.  NAD  Lab and Radiology Results No results found for this or any previous visit (from the past 72 hour(s)). No results found.     Assessment and Plan: 64 y.o. female with The primary encounter diagnosis was Chronic urticaria. A diagnosis of Diarrhea, unspecified type was also pertinent to this visit.   PDMP not reviewed this encounter. No orders of the defined types were placed in this encounter.  Meds ordered this encounter  Medications  . gabapentin (NEURONTIN) 300 MG capsule    Sig: Take 1 capsule (300 mg total) by mouth 3 (three) times daily as needed.    Dispense:  90 capsule    Refill:  5   There are no Patient Instructions on file for this visit.  Instructions sent via MyChart. If MyChart not available, pt was given option for info via personal e-mail w/ no guarantee of  protected health info over unsecured e-mail communication, and MyChart sign-up instructions were sent to patient.   Follow Up Instructions: Return if symptoms worsen or fail to improve.    I discussed the assessment and treatment plan with the patient. The patient was provided an opportunity to ask questions and all were answered. The patient agreed with the plan and demonstrated an understanding of the instructions.   The patient was advised to call back or seek an in-person evaluation if any new concerns, if symptoms worsen or if the condition fails to improve as anticipated.  30 minutes of non-face-to-face time was provided during this encounter.      . . . . . . . . . . . . . Marland Kitchen                   Historical information moved to improve visibility of documentation.  Past Medical History:  Diagnosis Date  . Anemia   . Arthritis   . 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 Chronic kidney disease    stage 3  . Coronary artery disease   . Depression   . Diabetes mellitus without complication (HCC)    Type 1  . Diabetic retinopathy ([place])   . 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 Family history of adverse reaction to anesthesia    mother: " while she was under she stopped breathing."  . Fibromyalgia   . Gastroparesis   . GERD (gastroesophageal reflux disease)   .  Headache    migraines  . Hypothyroidism   . IBS (irritable bowel syndrome)   . Idiopathic edema   . Lyme disease   . Mitral valve prolapse   . Myocardial infarction ([place])    1 major in 1999 and 2 minor " small vessel disease."  . Osteoporosis   . Peripheral neuropathy   . Peripheral vascular disease ([place])   . 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 ABDOMINAL HYSTERECTOMY    . APPENDECTOMY    . BREAST  SURGERY     B/L biopsy and lumpectomy   . CARDIAC CATHETERIZATION    . CARPAL TUNNEL RELEASE    . CATARACT EXTRACTION W/ INTRAOCULAR LENS IMPLANT     right eye  . COLONOSCOPY W/ BIOPSIES AND POLYPECTOMY    . CORONARY ARTERY BYPASS GRAFT    . coronary artery stents     at [facility] and [place]  . ECTOPIC PREGNANCY SURGERY    . NASAL SEPTUM SURGERY    . OPEN REDUCTION INTERNAL FIXATION (ORIF) DISTAL RADIAL FRACTURE Right 05/06/2015   Procedure: OPEN REDUCTION INTERNAL FIXATION (ORIF) RIGHT DISTAL RADIAL FRACTURE AND REPAIRS AS NEEDED;  Surgeon: Iran Planas, MD;  Location: [facility];  Service: Orthopedics;  Laterality: Right;  . ORIF WRIST FRACTURE Left 11/05/2014  . ORIF WRIST FRACTURE Left 11/05/2014   Procedure: OPEN REDUCTION INTERNAL FIXATION (ORIF) LEFT WRIST FRACTURE AND REPAIR AS INDICATED;  Surgeon: Iran Planas, MD;  Location: [facility];  Service: Orthopedics;  Laterality: Left;  . TRIGGER FINGER RELEASE     Social History   Tobacco Use  . Smoking status: Former Smoker    Types: Cigarettes  . Smokeless tobacco: Never Used  . Tobacco comment:  " Quit smoking cigarettes in 20's "  Substance Use Topics  . Alcohol use: Yes    Comment: occasional beer or wine   family history includes Breast cancer in her mother and sister; Heart disease in her father; Hypertension in her father.  Medications: Current Outpatient Medications  Medication Sig Dispense Refill  . AMBULATORY NON FORMULARY MEDICATION Incontinence pads per patient preference, #unlimited, refill x99 Fax to [phone] 1 Units prn  . arginine 500 MG tablet Take 500 mg by mouth as directed.     . Armodafinil 250 MG tablet Take 0.5 tablets (125 mg total) by mouth daily. 45 tablet 3  . aspirin EC 325 MG tablet Take 1 tablet (325 mg total) by mouth daily. 30 tablet 0  . carvedilol (COREG) 12.5 MG tablet Take 1 tablet (12.5 mg total) by mouth 2 (two) times daily with a meal. 180 tablet 3  . Cholecalciferol (VITAMIN D3) 5000 UNITS TABS  Take 1,000 Units by mouth daily.     . ciclopirox (PENLAC) 8 % solution Apply topically at bedtime. Apply over nail and surrounding skin. Apply daily over previous coat. After seven (7) days, may remove with alcohol and continue cycle. 6.6 mL 0  . Clobetasol Propionate Emulsion 0.05 % topical foam Apply 1 application topically 2 (two) times daily. 100 g 1  . Continuous Blood Gluc Sensor (DEXCOM G6 SENSOR) MISC USE TO CONTINUOUSLY MONITOR BLOOD SUGARS. CHANGE EVERY 10 DAYS    . Continuous Blood Gluc Transmit (DEXCOM G6 TRANSMITTER) MISC USE TO CONTINUOUSLY MONITOR BLOOD SUGARS. CHANGE EVERY THREE MONTHS.    Marland Kitchen  cyclobenzaprine (FLEXERIL) 10 MG tablet Take 0.5-1 tablets (5-10 mg total) by mouth 3 (three) times daily as needed for muscle spasms. Caution: can cause drowsiness 60 tablet 1  . diclofenac sodium (VOLTAREN) 1 % GEL Apply 2 g topically 4 (four) times daily. To affected joint. 100 g 11  . diphenhydrAMINE HCl (BENADRYL ALLERGY PO) Take by mouth.    . ELDERBERRY PO Take by mouth.    . Esomeprazole Magnesium (NEXIUM PO) Take 20 mg by mouth every morning.     . fluocinonide (LIDEX) 0.05 % external solution APPLY SOLUTION TOPICALLY ONCE DAILY AS NEEDED TO SCALP    . folic acid (FOLVITE) 1 MG tablet Take by mouth.    . gabapentin (NEURONTIN) 300 MG capsule Take 1 capsule (300 mg total) by mouth 3 (three) times daily as needed. 90 capsule 5  . insulin aspart (NOVOLOG) 100 UNIT/ML injection USE VIA INSULIN PUMP TOTAL DAILY INSULIN 40 UNITS    . Insulin Human (INSULIN PUMP) SOLN Inject into the skin continuous. Novolog insulin    . ketoconazole (NIZORAL) 2 % shampoo APPLY 1 APPLICATION TOPICALLY 2 (TWO) TIMES A WEEK. 240 mL 1  . KRILL OIL PO Take by mouth.    . Linoleic Acid Conjugated 1000 MG CAPS Take 1,000 mg by mouth daily.     Marland Kitchen MELATONIN PO Take by mouth.    . modafinil (PROVIGIL) 200 MG tablet Take 0.5-1 tablets (100-200 mg total) by mouth daily. 90 tablet 1  . nitroGLYCERIN (NITROLINGUAL) 0.4  MG/SPRAY spray Place 1 spray under the tongue every 5 (five) minutes x 3 doses as needed for chest pain. 12 g 1  . NON FORMULARY Chloroella    . OVER THE COUNTER MEDICATION Take 750 mg by mouth at bedtime. GABA    . OVER THE COUNTER MEDICATION as directed. Magnesium L-Treonate    . Pancrelipase, Lip-Prot-Amyl, (CREON) 3000-9500 units CPEP Take by mouth.    . spironolactone (ALDACTONE) 25 MG tablet Take by mouth.    . thyroid (ARMOUR THYROID) 60 MG tablet Take 1 tablet (60 mg total) by mouth daily before breakfast. 90 tablet 3  . torsemide ([name]) 20 MG tablet TAKE 1 TABLET ONCE DAILY INTHE MORNING. MAY TAKE AN   EXTRA 1/2 TABLET AS        NEEDED (Patient taking differently: every other day. TAKE 1 TABLET ONCE DAILY INTHE MORNING. MAY TAKE AN   EXTRA 1/2 TABLET AS NEEDED) 135 tablet 0   No current facility-administered medications for this visit.   Allergies  Allergen Reactions  . Dilaudid [Hydromorphone] Other (See Comments)    Because of continuous glucose monitor  . Morphine Anaphylaxis and Shortness Of Breath  . Morphine And Related Shortness Of Breath  . Penicillins Hives    Has patient had a PCN reaction causing immediate rash, facial/tongue/throat swelling, SOB or lightheadedness with hypotension: Yes Has patient had a PCN reaction causing severe rash involving mucus membranes or skin necrosis: No Has patient had a PCN reaction that required hospitalization No Has patient had a PCN reaction occurring within the last 10 years: No If all of the above answers are "NO", then may proceed with Cephalosporin use.  . Tramadol Anaphylaxis  . Tramadol-Acetaminophen Other (See Comments)    Small vessel heart attack Other reaction(s): Cardiovascular Arrest (ALLERGY/intolerance), Other (See Comments)   . Acetaminophen Other (See Comments)    Alters insulin pump readings   . Amitriptyline Other (See Comments)    Severe headache/ out of body feeling  .  Codeine Other (See Comments)     Severe headaches/ out of body feeling   . Ibuprofen Other (See Comments)    Messes up CGM reading on glucose monitor    . Losartan Cough  . Propoxyphene Other (See Comments)    Severe headaches / out of body feeling   . Shellfish Allergy Diarrhea and Nausea And Vomiting  . Statins Other (See Comments)    Muscle pains Other reaction(s): Other  . Sulfamethoxazole Other (See Comments)    Mouth ulcers   . Trichophyton Itching and Other (See Comments)    (fungus) sinusitis

## 2019-07-01 ENCOUNTER — Inpatient Hospital Stay: Payer: 59

## 2019-07-01 ENCOUNTER — Ambulatory Visit: Payer: 59

## 2019-07-01 ENCOUNTER — Telehealth: Payer: Self-pay | Admitting: Family

## 2019-07-01 ENCOUNTER — Encounter: Payer: Self-pay | Admitting: Osteopathic Medicine

## 2019-07-01 ENCOUNTER — Inpatient Hospital Stay: Payer: 59 | Attending: Hematology & Oncology | Admitting: Family

## 2019-07-01 ENCOUNTER — Other Ambulatory Visit: Payer: Self-pay

## 2019-07-01 ENCOUNTER — Encounter: Payer: Self-pay | Admitting: Family

## 2019-07-01 VITALS — BP 174/62 | HR 93 | Temp 97.9°F | Resp 19 | Wt 101.0 lb

## 2019-07-01 DIAGNOSIS — D508 Other iron deficiency anemias: Secondary | ICD-10-CM | POA: Diagnosis not present

## 2019-07-01 DIAGNOSIS — I129 Hypertensive chronic kidney disease with stage 1 through stage 4 chronic kidney disease, or unspecified chronic kidney disease: Secondary | ICD-10-CM | POA: Diagnosis not present

## 2019-07-01 DIAGNOSIS — D631 Anemia in chronic kidney disease: Secondary | ICD-10-CM | POA: Diagnosis present

## 2019-07-01 DIAGNOSIS — N1831 Chronic kidney disease, stage 3a: Secondary | ICD-10-CM

## 2019-07-01 DIAGNOSIS — E1122 Type 2 diabetes mellitus with diabetic chronic kidney disease: Secondary | ICD-10-CM | POA: Insufficient documentation

## 2019-07-01 DIAGNOSIS — Z79899 Other long term (current) drug therapy: Secondary | ICD-10-CM | POA: Diagnosis not present

## 2019-07-01 DIAGNOSIS — Z8673 Personal history of transient ischemic attack (TIA), and cerebral infarction without residual deficits: Secondary | ICD-10-CM | POA: Insufficient documentation

## 2019-07-01 DIAGNOSIS — I2510 Atherosclerotic heart disease of native coronary artery without angina pectoris: Secondary | ICD-10-CM

## 2019-07-01 DIAGNOSIS — D638 Anemia in other chronic diseases classified elsewhere: Secondary | ICD-10-CM

## 2019-07-01 DIAGNOSIS — E1022 Type 1 diabetes mellitus with diabetic chronic kidney disease: Secondary | ICD-10-CM

## 2019-07-01 DIAGNOSIS — N189 Chronic kidney disease, unspecified: Secondary | ICD-10-CM | POA: Diagnosis present

## 2019-07-01 DIAGNOSIS — N183 Chronic kidney disease, stage 3 unspecified: Secondary | ICD-10-CM

## 2019-07-01 LAB — CBC WITH DIFFERENTIAL (CANCER CENTER ONLY)
Band Neutrophils: 0 %
Basophils Absolute: 0.1 10*3/uL (ref 0.0–0.1)
Basophils Relative: 1 %
Eosinophils Absolute: 0.5 10*3/uL (ref 0.0–0.5)
Eosinophils Relative: 6 %
HCT: 27 % — ABNORMAL LOW (ref 36.0–46.0)
Hemoglobin: 9.1 g/dL — ABNORMAL LOW (ref 12.0–15.0)
Lymphocytes Relative: 22 %
Lymphs Abs: 1.7 10*3/uL (ref 0.7–4.0)
MCH: 29 pg (ref 26.0–34.0)
MCHC: 33.7 g/dL (ref 30.0–36.0)
MCV: 86 fL (ref 80.0–100.0)
Monocytes Absolute: 0.8 10*3/uL (ref 0.1–1.0)
Monocytes Relative: 10 %
Neutro Abs: 4.7 10*3/uL (ref 1.7–7.7)
Neutrophils Relative %: 60 %
Platelet Count: 319 10*3/uL (ref 150–400)
RBC: 3.14 MIL/uL — ABNORMAL LOW (ref 3.87–5.11)
RDW: 13.4 % (ref 11.5–15.5)
WBC Count: 7.8 10*3/uL (ref 4.0–10.5)
nRBC: 0 % (ref 0.0–0.2)

## 2019-07-01 LAB — CMP (CANCER CENTER ONLY)
ALT: 13 U/L (ref 0–44)
AST: 25 U/L (ref 15–41)
Albumin: 3.7 g/dL (ref 3.5–5.0)
Alkaline Phosphatase: 62 U/L (ref 38–126)
Anion gap: 11 (ref 5–15)
BUN: 22 mg/dL (ref 8–23)
CO2: 21 mmol/L — ABNORMAL LOW (ref 22–32)
Calcium: 8.8 mg/dL — ABNORMAL LOW (ref 8.9–10.3)
Chloride: 89 mmol/L — ABNORMAL LOW (ref 98–111)
Creatinine: 1.21 mg/dL — ABNORMAL HIGH (ref 0.44–1.00)
GFR, Est AFR Am: 55 mL/min — ABNORMAL LOW (ref >60)
GFR, Estimated: 47 mL/min — ABNORMAL LOW (ref >60)
Glucose, Bld: 330 mg/dL — ABNORMAL HIGH (ref 70–99)
Potassium: 4.4 mmol/L (ref 3.5–5.1)
Sodium: 121 mmol/L — ABNORMAL LOW (ref 135–145)
Total Bilirubin: 0.6 mg/dL (ref 0.3–1.2)
Total Protein: 6 g/dL — ABNORMAL LOW (ref 6.5–8.1)

## 2019-07-01 MED ORDER — GADOBUTROL 1 MMOL/ML IV SOLN
4.4000 mL | Freq: Once | INTRAVENOUS | Status: AC | PRN
Start: 2019-07-01 — End: 2019-07-01
  Administered 2019-07-01: 4.4 mL via INTRAVENOUS

## 2019-07-01 MED ORDER — DARBEPOETIN ALFA 200 MCG/0.4ML IJ SOSY
200.0000 ug | PREFILLED_SYRINGE | Freq: Once | INTRAMUSCULAR | Status: AC
  Administered 2019-07-01: 200 ug via SUBCUTANEOUS

## 2019-07-01 MED ORDER — DARBEPOETIN ALFA 200 MCG/0.4ML IJ SOSY
PREFILLED_SYRINGE | INTRAMUSCULAR | Status: AC
  Filled 2019-07-01: qty 0.4

## 2019-07-01 NOTE — Telephone Encounter (Signed)
Appointments scheduled calendar printed per 6/7 los

## 2019-07-01 NOTE — Patient Instructions (Signed)
Darbepoetin Alfa injection What is this medicine? DARBEPOETIN ALFA (dar be POE e tin AL fa) helps your body make more red blood cells. It is used to treat anemia caused by chronic kidney failure and chemotherapy. This medicine may be used for other purposes; ask your health care provider or pharmacist if you have questions. COMMON BRAND NAME(S): Aranesp What should I tell my health care provider before I take this medicine? They need to know if you have any of these conditions:  blood clotting disorders or history of blood clots  cancer patient not on chemotherapy  cystic fibrosis  heart disease, such as angina, heart failure, or a history of a heart attack  hemoglobin level of 12 g/dL or greater  high blood pressure  low levels of folate, iron, or vitamin B12  seizures  an unusual or allergic reaction to darbepoetin, erythropoietin, albumin, hamster proteins, latex, other medicines, foods, dyes, or preservatives  pregnant or trying to get pregnant  breast-feeding How should I use this medicine? This medicine is for injection into a vein or under the skin. It is usually given by a health care professional in a hospital or clinic setting. If you get this medicine at home, you will be taught how to prepare and give this medicine. Use exactly as directed. Take your medicine at regular intervals. Do not take your medicine more often than directed. It is important that you put your used needles and syringes in a special sharps container. Do not put them in a trash can. If you do not have a sharps container, call your pharmacist or healthcare provider to get one. A special MedGuide will be given to you by the pharmacist with each prescription and refill. Be sure to read this information carefully each time. Talk to your pediatrician regarding the use of this medicine in children. While this medicine may be used in children as young as 1 month of age for selected conditions, precautions do  apply. Overdosage: If you think you have taken too much of this medicine contact a poison control center or emergency room at once. NOTE: This medicine is only for you. Do not share this medicine with others. What if I miss a dose? If you miss a dose, take it as soon as you can. If it is almost time for your next dose, take only that dose. Do not take double or extra doses. What may interact with this medicine? Do not take this medicine with any of the following medications:  epoetin alfa This list may not describe all possible interactions. Give your health care provider a list of all the medicines, herbs, non-prescription drugs, or dietary supplements you use. Also tell them if you smoke, drink alcohol, or use illegal drugs. Some items may interact with your medicine. What should I watch for while using this medicine? Your condition will be monitored carefully while you are receiving this medicine. You may need blood work done while you are taking this medicine. This medicine may cause a decrease in vitamin B6. You should make sure that you get enough vitamin B6 while you are taking this medicine. Discuss the foods you eat and the vitamins you take with your health care professional. What side effects may I notice from receiving this medicine? Side effects that you should report to your doctor or health care professional as soon as possible:  allergic reactions like skin rash, itching or hives, swelling of the face, lips, or tongue  breathing problems  changes in   vision  chest pain  confusion, trouble speaking or understanding  feeling faint or lightheaded, falls  high blood pressure  muscle aches or pains  pain, swelling, warmth in the leg  rapid weight gain  severe headaches  sudden numbness or weakness of the face, arm or leg  trouble walking, dizziness, loss of balance or coordination  seizures (convulsions)  swelling of the ankles, feet, hands  unusually weak or  tired Side effects that usually do not require medical attention (report to your doctor or health care professional if they continue or are bothersome):  diarrhea  fever, chills (flu-like symptoms)  headaches  nausea, vomiting  redness, stinging, or swelling at site where injected This list may not describe all possible side effects. Call your doctor for medical advice about side effects. You may report side effects to FDA at 1-800-FDA-1088. Where should I keep my medicine? Keep out of the reach of children. Store in a refrigerator between 2 and 8 degrees C (36 and 46 degrees F). Do not freeze. Do not shake. Throw away any unused portion if using a single-dose vial. Throw away any unused medicine after the expiration date. NOTE: This sheet is a summary. It may not cover all possible information. If you have questions about this medicine, talk to your doctor, pharmacist, or health care provider.  2020 [employer]vier/Gold Standard (2017-01-25 16:44:20)  

## 2019-07-01 NOTE — Progress Notes (Signed)
Hematology and Oncology Follow Up Visit  Brittney Pace [id number] Jan 27, 1955 64 y.o. 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 Hgb is 9.1 and she is symptomatic with fatigue.  She took off her insulin pump earlier to have an MRI of the kidneys (ordered by PCP) and her blood glucose was a bit high this afternoon. She states that his was expected and her pump is back on.  She is still working to regulate her HTN and associated dizziness and headaches.  No fever, chills, n/v, cough, rash, chest pain, palpitations, abdominal pain or changes in bowel or bladder habits.  She notes occasional abdominal bloating and puffiness in her feet and ankles that comes and goes.  She states that she was recently seen my cardiologist Dr. Stanford Breed and was not in heart failure.  No episodes of bleeding. No bruising or petechiae.  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  No falls or syncopal episodes to report.  She states that her appetite comes and goes and she needs to better hydrate throughout the day. Her weight is stable.   ECOG Performance Status: 1 - Symptomatic but completely ambulatory  Medications:  Allergies as of 07/01/2019      Reactions   Dilaudid [hydromorphone] Other (See Comments)   Because of continuous glucose monitor   Morphine Anaphylaxis, Shortness Of Breath   Morphine And Related Shortness Of Breath   Penicillins Hives   Has patient had a PCN reaction causing immediate rash, facial/tongue/throat swelling, SOB or lightheadedness with hypotension: Yes Has patient had a PCN reaction causing severe rash involving mucus membranes or skin necrosis: No Has patient had a PCN reaction that required hospitalization  No Has patient had a PCN reaction occurring within the last 10 years: No If all of the above answers are "NO", then may proceed with Cephalosporin use.   Tramadol Anaphylaxis   Tramadol-acetaminophen Other (See Comments)   Small vessel heart attack Other reaction(s): Cardiovascular Arrest (ALLERGY/intolerance), Other (See Comments)   Acetaminophen Other (See Comments)   Alters insulin pump readings   Amitriptyline Other (See Comments)   Severe headache/ out of body feeling   Codeine Other (See Comments)   Severe headaches/ out of body feeling   Ibuprofen Other (See Comments)   Messes up CGM reading on glucose monitor   Losartan Cough   Propoxyphene Other (See Comments)   Severe headaches / out of body feeling   Shellfish Allergy Diarrhea, Nausea And Vomiting   Statins Other (See Comments)   Muscle pains Other reaction(s): Other   Sulfamethoxazole Other (See Comments)   Mouth ulcers   Trichophyton Itching, Other (See Comments)   (fungus) sinusitis      Medication List       Accurate as of July 01, 2019  1:56 PM. If you have any questions, ask your nurse or doctor.        AMBULATORY NON FORMULARY MEDICATION Incontinence pads per patient preference, #unlimited, refill x99 Fax to [phone]   arginine 500 MG tablet Take 500 mg by mouth as directed.   Armodafinil 250 MG tablet Take 0.5 tablets (125 mg total) by mouth daily.   aspirin EC 325 MG tablet Take 1 tablet (325 mg total) by mouth daily.   BENADRYL ALLERGY  PO Take by mouth.   carvedilol 12.5 MG tablet Commonly known as: COREG Take 1 tablet (12.5 mg total) by mouth 2 (two) times daily with a meal.   ciclopirox 8 % solution Commonly known as: PENLAC Apply topically at bedtime. Apply over nail and surrounding skin. Apply daily over previous coat. After seven (7) days, may remove with alcohol and continue cycle.   Clobetasol Propionate Emulsion 0.05 % topical foam Apply 1 application topically 2 (two) times  daily.   Creon 3000-9500 units Cpep Generic drug: Pancrelipase (Lip-Prot-Amyl) Take by mouth.   cyclobenzaprine 10 MG tablet Commonly known as: FLEXERIL Take 0.5-1 tablets (5-10 mg total) by mouth 3 (three) times daily as needed for muscle spasms. Caution: can cause drowsiness   Dexcom G6 Sensor Misc USE TO CONTINUOUSLY MONITOR BLOOD SUGARS. CHANGE EVERY 10 DAYS   Dexcom G6 Transmitter Misc USE TO CONTINUOUSLY MONITOR BLOOD SUGARS. CHANGE EVERY THREE MONTHS.   diclofenac sodium 1 % Gel Commonly known as: VOLTAREN Apply 2 g topically 4 (four) times daily. To affected joint.   ELDERBERRY PO Take by mouth.   fluocinonide 0.05 % external solution Commonly known as: LIDEX APPLY SOLUTION TOPICALLY ONCE DAILY AS NEEDED TO SCALP   folic acid 1 MG tablet Commonly known as: FOLVITE Take by mouth.   gabapentin 300 MG capsule Commonly known as: NEURONTIN Take 1 capsule (300 mg total) by mouth 3 (three) times daily as needed.   insulin aspart 100 UNIT/ML injection Commonly known as: novoLOG USE VIA INSULIN PUMP TOTAL DAILY INSULIN 40 UNITS   insulin pump Soln Inject into the skin continuous. Novolog insulin   ketoconazole 2 % shampoo Commonly known as: NIZORAL APPLY 1 APPLICATION TOPICALLY 2 (TWO) TIMES A WEEK.   KRILL OIL PO Take by mouth.   Linoleic Acid Conjugated 1000 MG Caps Take 1,000 mg by mouth daily.   MELATONIN PO Take by mouth.   modafinil 200 MG tablet Commonly known as: PROVIGIL Take 0.5-1 tablets (100-200 mg total) by mouth daily.   NEXIUM PO Take 20 mg by mouth every morning.   nitroGLYCERIN 0.4 MG/SPRAY spray Commonly known as: NITROLINGUAL Place 1 spray under the tongue every 5 (five) minutes x 3 doses as needed for chest pain.   NON FORMULARY Chloroella   OVER THE COUNTER MEDICATION Take 750 mg by mouth at bedtime. GABA   OVER THE COUNTER MEDICATION as directed. Magnesium L-Treonate   spironolactone 25 MG tablet Commonly known as:  ALDACTONE Take by mouth.   thyroid 60 MG tablet Commonly known as: Armour Thyroid Take 1 tablet (60 mg total) by mouth daily before breakfast.   torsemide 20 MG tablet Commonly known as: [name] TAKE 1 TABLET ONCE DAILY INTHE MORNING. MAY TAKE AN   EXTRA 1/2 TABLET AS        NEEDED What changed: See the new instructions.   Vitamin D3 125 MCG (5000 UT) Tabs Take 1,000 Units by mouth daily.       Allergies:  Allergies  Allergen Reactions  . Dilaudid [Hydromorphone] Other (See Comments)    Because of continuous glucose monitor  . Morphine Anaphylaxis and Shortness Of Breath  . Morphine And Related Shortness Of Breath  . Penicillins Hives    Has patient had a PCN reaction causing immediate rash, facial/tongue/throat swelling, SOB or lightheadedness with hypotension: Yes Has patient had a PCN reaction causing severe rash involving mucus membranes or skin necrosis: No Has patient had a PCN reaction that required hospitalization No Has patient had  a PCN reaction occurring within the last 10 years: No If all of the above answers are "NO", then may proceed with Cephalosporin use.  . Tramadol Anaphylaxis  . Tramadol-Acetaminophen Other (See Comments)    Small vessel heart attack Other reaction(s): Cardiovascular Arrest (ALLERGY/intolerance), Other (See Comments)   . Acetaminophen Other (See Comments)    Alters insulin pump readings   . Amitriptyline Other (See Comments)    Severe headache/ out of body feeling  . Codeine Other (See Comments)    Severe headaches/ out of body feeling   . Ibuprofen Other (See Comments)    Messes up CGM reading on glucose monitor    . Losartan Cough  . Propoxyphene Other (See Comments)    Severe headaches / out of body feeling   . Shellfish Allergy Diarrhea and Nausea And Vomiting  . Statins Other (See Comments)    Muscle pains Other reaction(s): Other  . Sulfamethoxazole Other (See Comments)    Mouth ulcers   . Trichophyton Itching and  Other (See Comments)    (fungus) sinusitis     Past Medical History, Surgical history, Social history, and Family History were reviewed and updated.  Review of Systems: All other 10 point review of systems is negative.   Physical Exam:  weight is 101 lb (45.8 kg). Her temporal temperature is 97.9 F (36.6 C). Her blood pressure is 174/62 (abnormal) and her pulse is 93. Her respiration is 19 and oxygen saturation is 100%.   Wt Readings from Last 3 Encounters:  07/01/19 101 lb (45.8 kg)  06/12/19 98 lb 12.8 oz (44.8 kg)  06/03/19 99 lb 1.3 oz (44.9 kg)    Ocular: Sclerae unicteric, pupils equal, round and reactive to light Ear-nose-throat: Oropharynx clear, dentition fair Lymphatic: No cervical or supraclavicular adenopathy Lungs no rales or rhonchi, good excursion bilaterally Heart regular rate and rhythm, no murmur appreciated Abd soft, nontender, positive bowel sounds, no liver or spleen tip palpated on exam, no fluid wave  MSK no focal spinal tenderness, no joint edema Neuro: non-focal, well-oriented, appropriate affect Breasts: Deferred   Lab Results  Component Value Date   WBC 7.8 07/01/2019   HGB 9.1 (L) 07/01/2019   HCT 27.0 (L) 07/01/2019   MCV 86.0 07/01/2019   PLT 319 07/01/2019   Lab Results  Component Value Date   FERRITIN 57 05/06/2019   IRON 68 05/06/2019   TIBC 316 05/06/2019   UIBC 249 05/06/2019   IRONPCTSAT 21 05/06/2019   Lab Results  Component Value Date   RETICCTPCT 0.9 05/06/2019   RBC 3.14 (L) 07/01/2019   No results found for: KPAFRELGTCHN, LAMBDASER, KAPLAMBRATIO No results found for: IGGSERUM, IGA, IGMSERUM No results found for: Kathrynn Ducking, MSPIKE, SPEI   Chemistry      Component Value Date/Time   NA 121 (L) 07/01/2019 1313   NA 138 07/17/2017 1128   K 4.4 07/01/2019 1313   CL 89 (L) 07/01/2019 1313   CO2 21 (L) 07/01/2019 1313   BUN 22 07/01/2019 1313   BUN 23 07/17/2017 1128    CREATININE 1.21 (H) 07/01/2019 1313   CREATININE 0.99 07/20/2018 0821      Component Value Date/Time   CALCIUM 8.8 (L) 07/01/2019 1313   ALKPHOS 62 07/01/2019 1313   AST 25 07/01/2019 1313   ALT 13 07/01/2019 1313   BILITOT 0.6 07/01/2019 1313       Impression and Plan: Ms. Ercole is a very pleasant 64 yo female with  multifactorial anemia. She received Aranesp today for Hgb 9.1.  We will plan to see her again in 4 weeks.  She is in agreement and will contact our office with any questions or concerns. We can certainly see her sooner if needed.   Brittney Peace, NP 6/7/20211:56 PM

## 2019-07-24 ENCOUNTER — Encounter: Payer: Self-pay | Admitting: Osteopathic Medicine

## 2019-07-24 MED ORDER — ARMODAFINIL 250 MG PO TABS: 125.0000 mg | ORAL_TABLET | Freq: Every day | ORAL | 3 refills | Status: DC

## 2019-07-26 ENCOUNTER — Telehealth: Payer: Self-pay

## 2019-07-26 NOTE — Telephone Encounter (Signed)
[facility] pharmacy needs clarification on RX that was sent in on 07/24/2019 for Armodafinil 250 MG tablet, after reviewing the notes from 05/20/2019 visit it says to change to modafinil (PROVIGIL) 200 MG tablet due to cost. Which medication should the pharmacy fill. Please advise, Thanks

## 2019-07-26 NOTE — Telephone Encounter (Signed)
Patient has picked up medication from the pharmacy.

## 2019-07-26 NOTE — Telephone Encounter (Signed)
Per recent patietn MyChart she requested to go back onto armodafinil, can cancel the other one

## 2019-07-30 ENCOUNTER — Inpatient Hospital Stay: Payer: 59

## 2019-07-30 ENCOUNTER — Encounter: Payer: Self-pay | Admitting: Family

## 2019-07-30 ENCOUNTER — Inpatient Hospital Stay: Payer: 59 | Attending: Hematology & Oncology

## 2019-07-30 ENCOUNTER — Other Ambulatory Visit: Payer: Self-pay

## 2019-07-30 ENCOUNTER — Inpatient Hospital Stay (HOSPITAL_BASED_OUTPATIENT_CLINIC_OR_DEPARTMENT_OTHER): Payer: 59 | Admitting: Family

## 2019-07-30 VITALS — BP 183/72 | HR 61 | Temp 97.9°F | Resp 16 | Ht 60.0 in | Wt 101.7 lb

## 2019-07-30 DIAGNOSIS — I2510 Atherosclerotic heart disease of native coronary artery without angina pectoris: Secondary | ICD-10-CM

## 2019-07-30 DIAGNOSIS — D631 Anemia in chronic kidney disease: Secondary | ICD-10-CM

## 2019-07-30 DIAGNOSIS — D638 Anemia in other chronic diseases classified elsewhere: Secondary | ICD-10-CM

## 2019-07-30 DIAGNOSIS — D508 Other iron deficiency anemias: Secondary | ICD-10-CM

## 2019-07-30 DIAGNOSIS — N1831 Chronic kidney disease, stage 3a: Secondary | ICD-10-CM

## 2019-07-30 DIAGNOSIS — E1022 Type 1 diabetes mellitus with diabetic chronic kidney disease: Secondary | ICD-10-CM

## 2019-07-30 DIAGNOSIS — I129 Hypertensive chronic kidney disease with stage 1 through stage 4 chronic kidney disease, or unspecified chronic kidney disease: Secondary | ICD-10-CM | POA: Insufficient documentation

## 2019-07-30 DIAGNOSIS — N189 Chronic kidney disease, unspecified: Secondary | ICD-10-CM | POA: Insufficient documentation

## 2019-07-30 DIAGNOSIS — E1122 Type 2 diabetes mellitus with diabetic chronic kidney disease: Secondary | ICD-10-CM | POA: Diagnosis not present

## 2019-07-30 DIAGNOSIS — Z79899 Other long term (current) drug therapy: Secondary | ICD-10-CM | POA: Diagnosis not present

## 2019-07-30 DIAGNOSIS — N183 Chronic kidney disease, stage 3 unspecified: Secondary | ICD-10-CM

## 2019-07-30 LAB — CBC WITH DIFFERENTIAL (CANCER CENTER ONLY)
Abs Immature Granulocytes: 0.03 10*3/uL (ref 0.00–0.07)
Basophils Absolute: 0.1 10*3/uL (ref 0.0–0.1)
Basophils Relative: 1 %
Eosinophils Absolute: 0.4 10*3/uL (ref 0.0–0.5)
Eosinophils Relative: 6 %
HCT: 29.9 % — ABNORMAL LOW (ref 36.0–46.0)
Hemoglobin: 9.7 g/dL — ABNORMAL LOW (ref 12.0–15.0)
Immature Granulocytes: 0 %
Lymphocytes Relative: 22 %
Lymphs Abs: 1.6 10*3/uL (ref 0.7–4.0)
MCH: 28.9 pg (ref 26.0–34.0)
MCHC: 32.4 g/dL (ref 30.0–36.0)
MCV: 89 fL (ref 80.0–100.0)
Monocytes Absolute: 0.7 10*3/uL (ref 0.1–1.0)
Monocytes Relative: 10 %
Neutro Abs: 4.5 10*3/uL (ref 1.7–7.7)
Neutrophils Relative %: 61 %
Platelet Count: 325 10*3/uL (ref 150–400)
RBC: 3.36 MIL/uL — ABNORMAL LOW (ref 3.87–5.11)
RDW: 13.7 % (ref 11.5–15.5)
WBC Count: 7.4 10*3/uL (ref 4.0–10.5)
nRBC: 0 % (ref 0.0–0.2)

## 2019-07-30 LAB — CMP (CANCER CENTER ONLY)
ALT: 12 U/L (ref 0–44)
AST: 20 U/L (ref 15–41)
Albumin: 3.8 g/dL (ref 3.5–5.0)
Alkaline Phosphatase: 63 U/L (ref 38–126)
Anion gap: 7 (ref 5–15)
BUN: 16 mg/dL (ref 8–23)
CO2: 26 mmol/L (ref 22–32)
Calcium: 9.3 mg/dL (ref 8.9–10.3)
Chloride: 96 mmol/L — ABNORMAL LOW (ref 98–111)
Creatinine: 1.21 mg/dL — ABNORMAL HIGH (ref 0.44–1.00)
GFR, Est AFR Am: 54 mL/min — ABNORMAL LOW (ref >60)
GFR, Estimated: 47 mL/min — ABNORMAL LOW (ref >60)
Glucose, Bld: 102 mg/dL — ABNORMAL HIGH (ref 70–99)
Potassium: 4.7 mmol/L (ref 3.5–5.1)
Sodium: 129 mmol/L — ABNORMAL LOW (ref 135–145)
Total Bilirubin: 0.4 mg/dL (ref 0.3–1.2)
Total Protein: 6.2 g/dL — ABNORMAL LOW (ref 6.5–8.1)

## 2019-07-30 MED ORDER — DARBEPOETIN ALFA 200 MCG/0.4ML IJ SOSY
PREFILLED_SYRINGE | INTRAMUSCULAR | Status: AC
  Filled 2019-07-30: qty 0.4

## 2019-07-30 MED ORDER — DARBEPOETIN ALFA 200 MCG/0.4ML IJ SOSY
200.0000 ug | PREFILLED_SYRINGE | Freq: Once | INTRAMUSCULAR | Status: AC
  Administered 2019-07-30: 200 ug via SUBCUTANEOUS

## 2019-07-30 NOTE — Patient Instructions (Signed)
Darbepoetin Alfa injection What is this medicine? DARBEPOETIN ALFA (dar be POE e tin AL fa) helps your body make more red blood cells. It is used to treat anemia caused by chronic kidney failure and chemotherapy. This medicine may be used for other purposes; ask your health care provider or pharmacist if you have questions. COMMON BRAND NAME(S): Aranesp What should I tell my health care provider before I take this medicine? They need to know if you have any of these conditions:  blood clotting disorders or history of blood clots  cancer patient not on chemotherapy  cystic fibrosis  heart disease, such as angina, heart failure, or a history of a heart attack  hemoglobin level of 12 g/dL or greater  high blood pressure  low levels of folate, iron, or vitamin B12  seizures  an unusual or allergic reaction to darbepoetin, erythropoietin, albumin, hamster proteins, latex, other medicines, foods, dyes, or preservatives  pregnant or trying to get pregnant  breast-feeding How should I use this medicine? This medicine is for injection into a vein or under the skin. It is usually given by a health care professional in a hospital or clinic setting. If you get this medicine at home, you will be taught how to prepare and give this medicine. Use exactly as directed. Take your medicine at regular intervals. Do not take your medicine more often than directed. It is important that you put your used needles and syringes in a special sharps container. Do not put them in a trash can. If you do not have a sharps container, call your pharmacist or healthcare provider to get one. A special MedGuide will be given to you by the pharmacist with each prescription and refill. Be sure to read this information carefully each time. Talk to your pediatrician regarding the use of this medicine in children. While this medicine may be used in children as young as 1 month of age for selected conditions, precautions do  apply. Overdosage: If you think you have taken too much of this medicine contact a poison control center or emergency room at once. NOTE: This medicine is only for you. Do not share this medicine with others. What if I miss a dose? If you miss a dose, take it as soon as you can. If it is almost time for your next dose, take only that dose. Do not take double or extra doses. What may interact with this medicine? Do not take this medicine with any of the following medications:  epoetin alfa This list may not describe all possible interactions. Give your health care provider a list of all the medicines, herbs, non-prescription drugs, or dietary supplements you use. Also tell them if you smoke, drink alcohol, or use illegal drugs. Some items may interact with your medicine. What should I watch for while using this medicine? Your condition will be monitored carefully while you are receiving this medicine. You may need blood work done while you are taking this medicine. This medicine may cause a decrease in vitamin B6. You should make sure that you get enough vitamin B6 while you are taking this medicine. Discuss the foods you eat and the vitamins you take with your health care professional. What side effects may I notice from receiving this medicine? Side effects that you should report to your doctor or health care professional as soon as possible:  allergic reactions like skin rash, itching or hives, swelling of the face, lips, or tongue  breathing problems  changes in   vision  chest pain  confusion, trouble speaking or understanding  feeling faint or lightheaded, falls  high blood pressure  muscle aches or pains  pain, swelling, warmth in the leg  rapid weight gain  severe headaches  sudden numbness or weakness of the face, arm or leg  trouble walking, dizziness, loss of balance or coordination  seizures (convulsions)  swelling of the ankles, feet, hands  unusually weak or  tired Side effects that usually do not require medical attention (report to your doctor or health care professional if they continue or are bothersome):  diarrhea  fever, chills (flu-like symptoms)  headaches  nausea, vomiting  redness, stinging, or swelling at site where injected This list may not describe all possible side effects. Call your doctor for medical advice about side effects. You may report side effects to FDA at 1-800-FDA-1088. Where should I keep my medicine? Keep out of the reach of children. Store in a refrigerator between 2 and 8 degrees C (36 and 46 degrees F). Do not freeze. Do not shake. Throw away any unused portion if using a single-dose vial. Throw away any unused medicine after the expiration date. NOTE: This sheet is a summary. It may not cover all possible information. If you have questions about this medicine, talk to your doctor, pharmacist, or health care provider.  2020 [employer]vier/Gold Standard (2017-01-25 16:44:20)  

## 2019-07-30 NOTE — Progress Notes (Signed)
Hematology and Oncology Follow Up Visit  Brittney Pace [id number] Jun 20, 1955 64 y.o. 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 Her BP is still high at times which could certainly contribute. She states that she is taking her antihypertensives as prescribed.  She has mild SOB with over exertion and takes and break to rest as needed.  No episodes of bleeding. No bruising or petechiae.  Hgb is improved at 9.7, MCV 89.  No fever, chills, n/v, cough, rash, dizziness, chest pain, palpitations, abdominal pain or changes in bowel or bladder habits.  No swelling, tenderness, numbness or tingling in her extremities.  No falls or syncopal episodes to report.  She has a good appetite but admits that she needs to better hydrate throughout the day. Her weight is stable.   ECOG Performance Status: 1 - Symptomatic but completely ambulatory  Medications:  Allergies as of 07/30/2019      Reactions   Morphine Anaphylaxis, Shortness Of Breath   Morphine And Related Shortness Of Breath   Penicillins Hives   Has patient had a PCN reaction causing immediate rash, facial/tongue/throat swelling, SOB or lightheadedness with hypotension: Yes Has patient had a PCN reaction causing severe rash involving mucus membranes or skin necrosis: No Has patient had a PCN reaction that required hospitalization No Has patient had a PCN reaction occurring within the last 10 years: No If all of the above answers are "NO", then may proceed with Cephalosporin use.   Tramadol Anaphylaxis   Tramadol-acetaminophen Other (See Comments)   Small vessel heart attack Other reaction(s): Cardiovascular Arrest (ALLERGY/intolerance), Other  (See Comments)   Acetaminophen Other (See Comments)   Alters insulin pump readings   Amitriptyline Other (See Comments)   Severe headache/ out of body feeling   Codeine Other (See Comments)   Severe headaches/ out of body feeling   Ibuprofen Other (See Comments)   Messes up CGM reading on glucose monitor   Losartan Cough   Propoxyphene Other (See Comments)   Severe headaches / out of body feeling   Shellfish Allergy Diarrhea, Nausea And Vomiting   Statins Other (See Comments)   Muscle pains Other reaction(s): Other   Sulfamethoxazole Other (See Comments)   Mouth ulcers   Trichophyton Itching, Other (See Comments)   (fungus) sinusitis      Medication List       Accurate as of July 30, 2019  1:18 PM. If you have any questions, ask your nurse or doctor.        AMBULATORY NON FORMULARY MEDICATION Incontinence pads per patient preference, #unlimited, refill x99 Fax to [phone]   arginine 500 MG tablet Take 500 mg by mouth as directed.   Armodafinil 250 MG tablet Take 0.5 tablets (125 mg total) by mouth daily.   aspirin EC 325 MG tablet Take 1 tablet (325 mg total) by mouth daily.   BENADRYL ALLERGY PO Take by mouth.   carvedilol 12.5 MG tablet Commonly known as: COREG Take 1 tablet (12.5 mg total) by mouth 2 (two) times daily with a meal.   ciclopirox 8 % solution Commonly known as: PENLAC Apply topically at bedtime. Apply over nail and surrounding skin. Apply daily over previous coat. After seven (7) days, may remove  with alcohol and continue cycle.   Clobetasol Propionate Emulsion 0.05 % topical foam Apply 1 application topically 2 (two) times daily.   Creon 3000-9500 units Cpep Generic drug: Pancrelipase (Lip-Prot-Amyl) Take by mouth.   cyclobenzaprine 10 MG tablet Commonly known as: FLEXERIL Take 0.5-1 tablets (5-10 mg total) by mouth 3 (three) times daily as needed for muscle spasms. Caution: can cause drowsiness   Dexcom G6 Sensor Misc USE TO  CONTINUOUSLY MONITOR BLOOD SUGARS. CHANGE EVERY 10 DAYS   Dexcom G6 Transmitter Misc USE TO CONTINUOUSLY MONITOR BLOOD SUGARS. CHANGE EVERY THREE MONTHS.   diclofenac sodium 1 % Gel Commonly known as: VOLTAREN Apply 2 g topically 4 (four) times daily. To affected joint.   ELDERBERRY PO Take by mouth.   fluocinonide 0.05 % external solution Commonly known as: LIDEX APPLY SOLUTION TOPICALLY ONCE DAILY AS NEEDED TO SCALP   folic acid 1 MG tablet Commonly known as: FOLVITE Take by mouth.   gabapentin 300 MG capsule Commonly known as: NEURONTIN Take 1 capsule (300 mg total) by mouth 3 (three) times daily as needed.   insulin aspart 100 UNIT/ML injection Commonly known as: novoLOG USE VIA INSULIN PUMP TOTAL DAILY INSULIN 40 UNITS   insulin pump Soln Inject into the skin continuous. Novolog insulin   ketoconazole 2 % shampoo Commonly known as: NIZORAL APPLY 1 APPLICATION TOPICALLY 2 (TWO) TIMES A WEEK.   KRILL OIL PO Take by mouth.   Linoleic Acid Conjugated 1000 MG Caps Take 1,000 mg by mouth daily.   MELATONIN PO Take by mouth.   modafinil 200 MG tablet Commonly known as: PROVIGIL Take 0.5-1 tablets (100-200 mg total) by mouth daily.   NEXIUM PO Take 20 mg by mouth every morning.   nitroGLYCERIN 0.4 MG/SPRAY spray Commonly known as: NITROLINGUAL Place 1 spray under the tongue every 5 (five) minutes x 3 doses as needed for chest pain.   NON FORMULARY Chloroella   OVER THE COUNTER MEDICATION Take 750 mg by mouth at bedtime. GABA   OVER THE COUNTER MEDICATION as directed. Magnesium L-Treonate   spironolactone 25 MG tablet Commonly known as: ALDACTONE Take by mouth.   thyroid 60 MG tablet Commonly known as: Armour Thyroid Take 1 tablet (60 mg total) by mouth daily before breakfast.   torsemide 20 MG tablet Commonly known as: [name] TAKE 1 TABLET ONCE DAILY INTHE MORNING. MAY TAKE AN   EXTRA 1/2 TABLET AS        NEEDED What changed: See the new  instructions.   Vitamin D3 125 MCG (5000 UT) Tabs Take 1,000 Units by mouth daily.       Allergies:  Allergies  Allergen Reactions  . Morphine Anaphylaxis and Shortness Of Breath  . Morphine And Related Shortness Of Breath  . Penicillins Hives    Has patient had a PCN reaction causing immediate rash, facial/tongue/throat swelling, SOB or lightheadedness with hypotension: Yes Has patient had a PCN reaction causing severe rash involving mucus membranes or skin necrosis: No Has patient had a PCN reaction that required hospitalization No Has patient had a PCN reaction occurring within the last 10 years: No If all of the above answers are "NO", then may proceed with Cephalosporin use.  . Tramadol Anaphylaxis  . Tramadol-Acetaminophen Other (See Comments)    Small vessel heart attack Other reaction(s): Cardiovascular Arrest (ALLERGY/intolerance), Other (See Comments)   . Acetaminophen Other (See Comments)    Alters insulin pump readings   . Amitriptyline Other (See Comments)    Severe headache/  out of body feeling  . Codeine Other (See Comments)    Severe headaches/ out of body feeling   . Ibuprofen Other (See Comments)    Messes up CGM reading on glucose monitor    . Losartan Cough  . Propoxyphene Other (See Comments)    Severe headaches / out of body feeling   . Shellfish Allergy Diarrhea and Nausea And Vomiting  . Statins Other (See Comments)    Muscle pains Other reaction(s): Other  . Sulfamethoxazole Other (See Comments)    Mouth ulcers   . Trichophyton Itching and Other (See Comments)    (fungus) sinusitis     Past Medical History, Surgical history, Social history, and Family History were reviewed and updated.  Review of Systems: All other 10 point review of systems is negative.   Physical Exam:  height is 5' (1.524 m) and weight is 101 lb 11.2 oz (46.1 kg). Her oral temperature is 97.9 F (36.6 C). Her blood pressure is 183/72 (abnormal) and her pulse is  61. Her respiration is 16 and oxygen saturation is 100%.   Wt Readings from Last 3 Encounters:  07/30/19 101 lb 11.2 oz (46.1 kg)  07/01/19 101 lb (45.8 kg)  06/12/19 98 lb 12.8 oz (44.8 kg)    Ocular: Sclerae unicteric, pupils equal, round and reactive to light Ear-nose-throat: Oropharynx clear, dentition fair Lymphatic: No cervical or supraclavicular adenopathy Lungs no rales or rhonchi, good excursion bilaterally Heart regular rate and rhythm, no murmur appreciated Abd soft, nontender, positive bowel sounds, no liver or spleen tip palpated on exam, no fluid wave  MSK no focal spinal tenderness, no joint edema Neuro: non-focal, well-oriented, appropriate affect Breasts: Deferred   Lab Results  Component Value Date   WBC 7.4 07/30/2019   HGB 9.7 (L) 07/30/2019   HCT 29.9 (L) 07/30/2019   MCV 89.0 07/30/2019   PLT 325 07/30/2019   Lab Results  Component Value Date   FERRITIN 57 05/06/2019   IRON 68 05/06/2019   TIBC 316 05/06/2019   UIBC 249 05/06/2019   IRONPCTSAT 21 05/06/2019   Lab Results  Component Value Date   RETICCTPCT 0.9 05/06/2019   RBC 3.36 (L) 07/30/2019   No results found for: KPAFRELGTCHN, LAMBDASER, KAPLAMBRATIO No results found for: IGGSERUM, IGA, IGMSERUM No results found for: Kathrynn Ducking, MSPIKE, SPEI   Chemistry      Component Value Date/Time   NA 129 (L) 07/30/2019 1147   NA 138 07/17/2017 1128   K 4.7 07/30/2019 1147   CL 96 (L) 07/30/2019 1147   CO2 26 07/30/2019 1147   BUN 16 07/30/2019 1147   BUN 23 07/17/2017 1128   CREATININE 1.21 (H) 07/30/2019 1147   CREATININE 0.99 07/20/2018 0821      Component Value Date/Time   CALCIUM 9.3 07/30/2019 1147   ALKPHOS 63 07/30/2019 1147   AST 20 07/30/2019 1147   ALT 12 07/30/2019 1147   BILITOT 0.4 07/30/2019 1147       Impression and Plan: Brittney Pace is a very pleasant 64 yo female with multifactorial anemia. She received Aranesp today  for Hgb 9.7.  We will see her again in 4 weeks.  She is in agreement and will contact our office with any questions or concerns. We can certainly see her sooner if needed.  Laverna Peace, NP 7/6/20211:18 PM

## 2019-07-31 ENCOUNTER — Encounter: Payer: Self-pay | Admitting: Osteopathic Medicine

## 2019-07-31 LAB — IRON AND TIBC
Iron: 61 ug/dL (ref 41–142)
Saturation Ratios: 20 % — ABNORMAL LOW (ref 21–57)
TIBC: 305 ug/dL (ref 236–444)
UIBC: 244 ug/dL (ref 120–384)

## 2019-07-31 LAB — FERRITIN: Ferritin: 53 ng/mL (ref 11–307)

## 2019-08-01 ENCOUNTER — Other Ambulatory Visit: Payer: Self-pay | Admitting: Family

## 2019-08-22 DIAGNOSIS — H6983 Other specified disorders of Eustachian tube, bilateral: Secondary | ICD-10-CM | POA: Insufficient documentation

## 2019-08-22 DIAGNOSIS — Z9289 Personal history of other medical treatment: Secondary | ICD-10-CM | POA: Insufficient documentation

## 2019-08-29 ENCOUNTER — Inpatient Hospital Stay: Payer: 59

## 2019-08-29 ENCOUNTER — Inpatient Hospital Stay: Payer: 59 | Admitting: Hematology & Oncology

## 2019-09-04 ENCOUNTER — Telehealth: Payer: Self-pay | Admitting: Hematology & Oncology

## 2019-09-04 ENCOUNTER — Inpatient Hospital Stay: Payer: 59 | Attending: Hematology & Oncology

## 2019-09-04 ENCOUNTER — Inpatient Hospital Stay (HOSPITAL_BASED_OUTPATIENT_CLINIC_OR_DEPARTMENT_OTHER): Payer: 59 | Admitting: Hematology & Oncology

## 2019-09-04 ENCOUNTER — Inpatient Hospital Stay: Payer: 59

## 2019-09-04 ENCOUNTER — Other Ambulatory Visit: Payer: Self-pay

## 2019-09-04 ENCOUNTER — Encounter: Payer: Self-pay | Admitting: Hematology & Oncology

## 2019-09-04 VITALS — BP 150/60 | HR 65 | Temp 97.9°F | Resp 18 | Ht 60.0 in | Wt 99.1 lb

## 2019-09-04 DIAGNOSIS — D631 Anemia in chronic kidney disease: Secondary | ICD-10-CM | POA: Insufficient documentation

## 2019-09-04 DIAGNOSIS — E1022 Type 1 diabetes mellitus with diabetic chronic kidney disease: Secondary | ICD-10-CM

## 2019-09-04 DIAGNOSIS — Z79899 Other long term (current) drug therapy: Secondary | ICD-10-CM | POA: Diagnosis not present

## 2019-09-04 DIAGNOSIS — N1831 Chronic kidney disease, stage 3a: Secondary | ICD-10-CM

## 2019-09-04 DIAGNOSIS — Z8673 Personal history of transient ischemic attack (TIA), and cerebral infarction without residual deficits: Secondary | ICD-10-CM | POA: Insufficient documentation

## 2019-09-04 DIAGNOSIS — E119 Type 2 diabetes mellitus without complications: Secondary | ICD-10-CM | POA: Diagnosis not present

## 2019-09-04 DIAGNOSIS — D508 Other iron deficiency anemias: Secondary | ICD-10-CM

## 2019-09-04 DIAGNOSIS — D638 Anemia in other chronic diseases classified elsewhere: Secondary | ICD-10-CM

## 2019-09-04 DIAGNOSIS — D509 Iron deficiency anemia, unspecified: Secondary | ICD-10-CM

## 2019-09-04 DIAGNOSIS — N183 Chronic kidney disease, stage 3 unspecified: Secondary | ICD-10-CM

## 2019-09-04 DIAGNOSIS — I2510 Atherosclerotic heart disease of native coronary artery without angina pectoris: Secondary | ICD-10-CM

## 2019-09-04 DIAGNOSIS — N189 Chronic kidney disease, unspecified: Secondary | ICD-10-CM | POA: Insufficient documentation

## 2019-09-04 HISTORY — DX: Iron deficiency anemia, unspecified: D50.9

## 2019-09-04 LAB — CMP (CANCER CENTER ONLY)
ALT: 17 U/L (ref 0–44)
AST: 23 U/L (ref 15–41)
Albumin: 3.8 g/dL (ref 3.5–5.0)
Alkaline Phosphatase: 67 U/L (ref 38–126)
Anion gap: 5 (ref 5–15)
BUN: 29 mg/dL — ABNORMAL HIGH (ref 8–23)
CO2: 26 mmol/L (ref 22–32)
Calcium: 9.3 mg/dL (ref 8.9–10.3)
Chloride: 104 mmol/L (ref 98–111)
Creatinine: 1.25 mg/dL — ABNORMAL HIGH (ref 0.44–1.00)
GFR, Est AFR Am: 52 mL/min — ABNORMAL LOW (ref >60)
GFR, Estimated: 45 mL/min — ABNORMAL LOW (ref >60)
Glucose, Bld: 119 mg/dL — ABNORMAL HIGH (ref 70–99)
Potassium: 5.2 mmol/L — ABNORMAL HIGH (ref 3.5–5.1)
Sodium: 135 mmol/L (ref 135–145)
Total Bilirubin: 0.3 mg/dL (ref 0.3–1.2)
Total Protein: 6.1 g/dL — ABNORMAL LOW (ref 6.5–8.1)

## 2019-09-04 LAB — CBC WITH DIFFERENTIAL (CANCER CENTER ONLY)
Abs Immature Granulocytes: 0.02 10*3/uL (ref 0.00–0.07)
Basophils Absolute: 0.1 10*3/uL (ref 0.0–0.1)
Basophils Relative: 1 %
Eosinophils Absolute: 0.4 10*3/uL (ref 0.0–0.5)
Eosinophils Relative: 4 %
HCT: 32.7 % — ABNORMAL LOW (ref 36.0–46.0)
Hemoglobin: 10.1 g/dL — ABNORMAL LOW (ref 12.0–15.0)
Immature Granulocytes: 0 %
Lymphocytes Relative: 21 %
Lymphs Abs: 1.8 10*3/uL (ref 0.7–4.0)
MCH: 28 pg (ref 26.0–34.0)
MCHC: 30.9 g/dL (ref 30.0–36.0)
MCV: 90.6 fL (ref 80.0–100.0)
Monocytes Absolute: 0.8 10*3/uL (ref 0.1–1.0)
Monocytes Relative: 9 %
Neutro Abs: 5.5 10*3/uL (ref 1.7–7.7)
Neutrophils Relative %: 65 %
Platelet Count: 318 10*3/uL (ref 150–400)
RBC: 3.61 MIL/uL — ABNORMAL LOW (ref 3.87–5.11)
RDW: 13.3 % (ref 11.5–15.5)
WBC Count: 8.6 10*3/uL (ref 4.0–10.5)
nRBC: 0 % (ref 0.0–0.2)

## 2019-09-04 MED ORDER — DARBEPOETIN ALFA 200 MCG/0.4ML IJ SOSY
200.0000 ug | PREFILLED_SYRINGE | Freq: Once | INTRAMUSCULAR | Status: AC
  Administered 2019-09-04: 200 ug via SUBCUTANEOUS

## 2019-09-04 NOTE — Progress Notes (Signed)
Hematology and Oncology Follow Up Visit  Brittney Pace [id number] 06-May-1955 64 y.o. 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 is here today for follow-up.  She still is having her issues.  She still has some arthralgias and myalgias.  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  She said that it really helped her out.  When we last saw her back in July, her ferritin was 53 with iron saturation 20%.  I am not sure she got any iron at that time.  She has had no obvious bleeding.  She does have urinary issues from having a I think Foley catheter placed.  There has been no fever.  She has had no cough.  There is been no obvious bleeding.  She  does have diabetes.  She is on new insulin pump which seems to be working.  Overall, I would have to say her performance status is ECOG 1-2.     Medications:  Allergies as of 09/04/2019      Reactions   Morphine Anaphylaxis, Shortness Of Breath   Morphine And Related Shortness Of Breath   Penicillins Hives   Has patient had a PCN reaction causing immediate rash, facial/tongue/throat swelling, SOB or lightheadedness with hypotension: Yes Has patient had a PCN reaction causing severe rash involving mucus membranes or skin necrosis: No Has patient had a PCN reaction that required hospitalization No Has patient had a PCN reaction occurring within the last 10 years: No If all of the above answers are "NO", then may proceed with Cephalosporin use.   Tramadol Anaphylaxis   Tramadol-acetaminophen Other (See Comments)   Small vessel heart attack Other reaction(s): Cardiovascular Arrest (ALLERGY/intolerance), Other (See Comments)     Acetaminophen Other (See Comments)   Alters insulin pump readings   Amitriptyline Other (See Comments)   Severe headache/ out of body feeling   Codeine Other (See Comments)   Severe headaches/ out of body feeling   Ibuprofen Other (See Comments)   Messes up CGM reading on glucose monitor   Losartan Cough   Propoxyphene Other (See Comments)   Severe headaches / out of body feeling   Shellfish Allergy Diarrhea, Nausea And Vomiting   Statins Other (See Comments)   Muscle pains Other reaction(s): Other   Sulfamethoxazole Other (See Comments)   Mouth ulcers   Trichophyton Itching, Other (See Comments)   (fungus) sinusitis      Medication List       Accurate as of September 04, 2019 12:43 PM. If you have any questions, ask your nurse or doctor.        STOP taking these medications   Clobetasol Propionate Emulsion 0.05 % topical foam Stopped by: [name], MD   modafinil 200 MG tablet Commonly known as: PROVIGIL Stopped by: [name], MD     TAKE these medications   AMBULATORY NON FORMULARY MEDICATION Incontinence pads per patient preference, #unlimited, refill x99 Fax to [phone]   arginine 500 MG tablet Take 500 mg by mouth as directed.   Armodafinil 250 MG tablet Take 0.5 tablets (125 mg total) by mouth daily.   aspirin EC 325 MG tablet Take 1 tablet (325 mg total) by mouth daily.  BENADRYL ALLERGY PO Take by mouth.   carvedilol 12.5 MG tablet Commonly known as: COREG Take 1 tablet (12.5 mg total) by mouth 2 (two) times daily with a meal.   ciclopirox 8 % solution Commonly known as: PENLAC Apply topically at bedtime. Apply over nail and surrounding skin. Apply daily over previous coat. After seven (7) days, may remove with alcohol and continue cycle.   Creon 3000-9500 units Cpep Generic drug: Pancrelipase (Lip-Prot-Amyl) Take by mouth.   cyclobenzaprine 10 MG tablet Commonly known as: FLEXERIL Take 0.5-1 tablets (5-10 mg total) by  mouth 3 (three) times daily as needed for muscle spasms. Caution: can cause drowsiness   Dexcom G6 Sensor Misc USE TO CONTINUOUSLY MONITOR BLOOD SUGARS. CHANGE EVERY 10 DAYS   Dexcom G6 Transmitter Misc USE TO CONTINUOUSLY MONITOR BLOOD SUGARS. CHANGE EVERY THREE MONTHS.   diclofenac sodium 1 % Gel Commonly known as: VOLTAREN Apply 2 g topically 4 (four) times daily. To affected joint.   ELDERBERRY PO Take by mouth.   fluocinonide 0.05 % external solution Commonly known as: LIDEX APPLY SOLUTION TOPICALLY ONCE DAILY AS NEEDED TO SCALP   folic acid 1 MG tablet Commonly known as: FOLVITE Take by mouth.   gabapentin 300 MG capsule Commonly known as: NEURONTIN Take 1 capsule (300 mg total) by mouth 3 (three) times daily as needed.   insulin aspart 100 UNIT/ML injection Commonly known as: novoLOG USE VIA INSULIN PUMP TOTAL DAILY INSULIN 40 UNITS   insulin pump Soln Inject into the skin continuous. Novolog insulin   ketoconazole 2 % shampoo Commonly known as: NIZORAL APPLY 1 APPLICATION TOPICALLY 2 (TWO) TIMES A WEEK.   KRILL OIL PO Take by mouth.   Linoleic Acid Conjugated 1000 MG Caps Take 1,000 mg by mouth daily.   MELATONIN PO Take by mouth.   NEXIUM PO Take 20 mg by mouth every morning.   nitroGLYCERIN 0.4 MG/SPRAY spray Commonly known as: NITROLINGUAL Place 1 spray under the tongue every 5 (five) minutes x 3 doses as needed for chest pain.   NON FORMULARY Chloroella   OVER THE COUNTER MEDICATION Take 750 mg by mouth at bedtime. GABA   OVER THE COUNTER MEDICATION as directed. Magnesium L-Treonate   spironolactone 25 MG tablet Commonly known as: ALDACTONE Take by mouth.   thyroid 60 MG tablet Commonly known as: Armour Thyroid Take 1 tablet (60 mg total) by mouth daily before breakfast.   torsemide 20 MG tablet Commonly known as: [name] TAKE 1 TABLET ONCE DAILY INTHE MORNING. MAY TAKE AN   EXTRA 1/2 TABLET AS        NEEDED What changed: See the  new instructions.   Vitamin D3 125 MCG (5000 UT) Tabs Take 1,000 Units by mouth daily.       Allergies:  Allergies  Allergen Reactions  . Morphine Anaphylaxis and Shortness Of Breath  . Morphine And Related Shortness Of Breath  . Penicillins Hives    Has patient had a PCN reaction causing immediate rash, facial/tongue/throat swelling, SOB or lightheadedness with hypotension: Yes Has patient had a PCN reaction causing severe rash involving mucus membranes or skin necrosis: No Has patient had a PCN reaction that required hospitalization No Has patient had a PCN reaction occurring within the last 10 years: No If all of the above answers are "NO", then may proceed with Cephalosporin use.  . Tramadol Anaphylaxis  . Tramadol-Acetaminophen Other (See Comments)    Small vessel heart attack Other reaction(s): Cardiovascular Arrest (ALLERGY/intolerance), Other (See Comments)   .  Acetaminophen Other (See Comments)    Alters insulin pump readings   . Amitriptyline Other (See Comments)    Severe headache/ out of body feeling  . Codeine Other (See Comments)    Severe headaches/ out of body feeling   . Ibuprofen Other (See Comments)    Messes up CGM reading on glucose monitor    . Losartan Cough  . Propoxyphene Other (See Comments)    Severe headaches / out of body feeling   . Shellfish Allergy Diarrhea and Nausea And Vomiting  . Statins Other (See Comments)    Muscle pains Other reaction(s): Other  . Sulfamethoxazole Other (See Comments)    Mouth ulcers   . Trichophyton Itching and Other (See Comments)    (fungus) sinusitis     Past Medical History, Surgical history, Social history, and Family History were reviewed and updated.  Review of Systems: Review of Systems  Constitutional: Positive for malaise/fatigue.  HENT: Positive for ear discharge.   Eyes: Positive for blurred vision.  Respiratory: Negative.   Cardiovascular: Positive for claudication.  Gastrointestinal:  Positive for nausea.  Genitourinary: Positive for dysuria and urgency.  Musculoskeletal: Positive for joint pain, myalgias and neck pain.  Skin: Negative.   Neurological: Positive for dizziness, focal weakness and headaches.  Endo/Heme/Allergies: Negative.   Psychiatric/Behavioral: Negative.      Physical Exam:  height is 5' (1.524 m) and weight is 99 lb 1.3 oz (44.9 kg). Her oral temperature is 97.9 F (36.6 C). Her blood pressure is 150/60 (abnormal) and her pulse is 65. Her respiration is 18 and oxygen saturation is 99%.   Wt Readings from Last 3 Encounters:  09/04/19 99 lb 1.3 oz (44.9 kg)  07/30/19 101 lb 11.2 oz (46.1 kg)  07/01/19 101 lb (45.8 kg)    Physical Exam Vitals reviewed.  HENT:     Head: Normocephalic and atraumatic.  Eyes:     Pupils: Pupils are equal, round, and reactive to light.  Cardiovascular:     Rate and Rhythm: Normal rate and regular rhythm.     Heart sounds: Normal heart sounds.  Pulmonary:     Effort: Pulmonary effort is normal.     Breath sounds: Normal breath sounds.  Abdominal:     General: Bowel sounds are normal.     Palpations: Abdomen is soft.  Musculoskeletal:        General: No tenderness or deformity. Normal range of motion.     Cervical back: Normal range of motion.  Lymphadenopathy:     Cervical: No cervical adenopathy.  Skin:    General: Skin is warm and dry.     Findings: No erythema or rash.  Neurological:     Mental Status: She is alert and oriented to person, place, and time.  Psychiatric:        Behavior: Behavior normal.        Thought Content: Thought content normal.        Judgment: Judgment normal.      Lab Results  Component Value Date   WBC 8.6 09/04/2019   HGB 10.1 (L) 09/04/2019   HCT 32.7 (L) 09/04/2019   MCV 90.6 09/04/2019   PLT 318 09/04/2019   Lab Results  Component Value Date   FERRITIN 53 07/30/2019   IRON 61 07/30/2019   TIBC 305 07/30/2019   UIBC 244 07/30/2019   IRONPCTSAT 20 (L)  07/30/2019   Lab Results  Component Value Date   RETICCTPCT 0.9 05/06/2019   RBC 3.61 (L)  09/04/2019   No results found for: KPAFRELGTCHN, LAMBDASER, KAPLAMBRATIO No results found for: IGGSERUM, IGA, IGMSERUM No results found for: Ronnald Ramp, A1GS, A2GS, Tillman Sers, SPEI   Chemistry      Component Value Date/Time   NA 135 09/04/2019 1149   NA 138 07/17/2017 1128   K 5.2 (H) 09/04/2019 1149   CL 104 09/04/2019 1149   CO2 26 09/04/2019 1149   BUN 29 (H) 09/04/2019 1149   BUN 23 07/17/2017 1128   CREATININE 1.25 (H) 09/04/2019 1149   CREATININE 0.99 07/20/2018 0821      Component Value Date/Time   CALCIUM 9.3 09/04/2019 1149   ALKPHOS 67 09/04/2019 1149   AST 23 09/04/2019 1149   ALT 17 09/04/2019 1149   BILITOT 0.3 09/04/2019 1149       Impression and Plan: Ms. Spagnolo is a very pleasant 64 yo female with multifactorial anemia. I do not worry about iron deficiency.  Again her arm saturation on 20%.  We will have to see if it is lower.  If so, we will definitely need to give her IV iron.  Her hemoglobin is a little bit better.  With this, hopefully the Aranesp is helping a little bit.  If her iron is low, I would think that her hemoglobin will improve nicely with supplemental IV iron.  She does have a lot of health issues.  Hopefully, her family doctor will be able to help out with this.  We will plan to get her back to see [place] in another month or so.   [name], MD 8/11/202112:43 PM

## 2019-09-04 NOTE — Telephone Encounter (Signed)
Appointments scheduled calendar printed & mailed per 8/11 los

## 2019-09-05 LAB — IRON AND TIBC
Iron: 72 ug/dL (ref 41–142)
Saturation Ratios: 23 % (ref 21–57)
TIBC: 311 ug/dL (ref 236–444)
UIBC: 239 ug/dL (ref 120–384)

## 2019-09-05 LAB — FERRITIN: Ferritin: 30 ng/mL (ref 11–307)

## 2019-10-04 ENCOUNTER — Telehealth: Payer: Self-pay

## 2019-10-04 ENCOUNTER — Other Ambulatory Visit: Payer: Self-pay | Admitting: Medical-Surgical

## 2019-10-04 MED ORDER — GABAPENTIN 100 MG PO CAPS: 100.0000 mg | ORAL_CAPSULE | Freq: Three times a day (TID) | ORAL | 5 refills | Status: DC | PRN

## 2019-10-04 NOTE — Telephone Encounter (Signed)
Patient wants to know if her gabapentin dosage can be changed to a lower dose. She says it makes her foggy and groggy the next day, however it is helping her with her pain. Patient is aware Dr. Sheppard Coil is out of the office until Tuesday and this message will be sent to a covering provider. Please advise, Thanks

## 2019-10-04 NOTE — Telephone Encounter (Signed)
New prescription for gabapentin 100 mg capsules sent to pharmacy with instructions to take 1 to 2 capsules 3 times daily as needed.

## 2019-10-11 ENCOUNTER — Ambulatory Visit: Payer: Medicare Other | Admitting: Hematology & Oncology

## 2019-10-11 ENCOUNTER — Other Ambulatory Visit: Payer: Medicare Other

## 2019-10-11 ENCOUNTER — Ambulatory Visit: Payer: Medicare Other

## 2019-10-15 ENCOUNTER — Encounter: Payer: Self-pay | Admitting: Osteopathic Medicine

## 2019-10-16 ENCOUNTER — Other Ambulatory Visit: Payer: Self-pay | Admitting: Osteopathic Medicine

## 2019-10-16 ENCOUNTER — Other Ambulatory Visit: Payer: Self-pay

## 2019-10-16 DIAGNOSIS — R5383 Other fatigue: Secondary | ICD-10-CM

## 2019-10-16 NOTE — Telephone Encounter (Signed)
Last refill -07/24/2019 Last Ov- 06/14/2019 Patient is asking for a 90 supply

## 2019-10-17 MED ORDER — ARMODAFINIL 250 MG PO TABS: 125.0000 mg | ORAL_TABLET | Freq: Every day | ORAL | 3 refills | Status: DC

## 2019-10-24 ENCOUNTER — Ambulatory Visit: Payer: Medicare Other | Admitting: Hematology & Oncology

## 2019-10-24 ENCOUNTER — Other Ambulatory Visit: Payer: Medicare Other

## 2019-10-24 ENCOUNTER — Ambulatory Visit: Payer: Medicare Other

## 2019-10-25 ENCOUNTER — Encounter: Payer: Self-pay | Admitting: Hematology & Oncology

## 2019-10-25 ENCOUNTER — Other Ambulatory Visit: Payer: Self-pay

## 2019-10-25 ENCOUNTER — Inpatient Hospital Stay: Payer: 59 | Attending: Hematology & Oncology

## 2019-10-25 ENCOUNTER — Inpatient Hospital Stay (HOSPITAL_BASED_OUTPATIENT_CLINIC_OR_DEPARTMENT_OTHER): Payer: 59 | Admitting: Hematology & Oncology

## 2019-10-25 ENCOUNTER — Inpatient Hospital Stay: Payer: 59

## 2019-10-25 VITALS — BP 157/65 | HR 64 | Temp 98.0°F | Resp 16 | Wt 97.0 lb

## 2019-10-25 DIAGNOSIS — D500 Iron deficiency anemia secondary to blood loss (chronic): Secondary | ICD-10-CM | POA: Diagnosis not present

## 2019-10-25 DIAGNOSIS — I2510 Atherosclerotic heart disease of native coronary artery without angina pectoris: Secondary | ICD-10-CM

## 2019-10-25 DIAGNOSIS — D631 Anemia in chronic kidney disease: Secondary | ICD-10-CM

## 2019-10-25 DIAGNOSIS — E1022 Type 1 diabetes mellitus with diabetic chronic kidney disease: Secondary | ICD-10-CM

## 2019-10-25 DIAGNOSIS — N189 Chronic kidney disease, unspecified: Secondary | ICD-10-CM | POA: Diagnosis present

## 2019-10-25 DIAGNOSIS — D508 Other iron deficiency anemias: Secondary | ICD-10-CM

## 2019-10-25 DIAGNOSIS — D638 Anemia in other chronic diseases classified elsewhere: Secondary | ICD-10-CM

## 2019-10-25 DIAGNOSIS — N183 Chronic kidney disease, stage 3 unspecified: Secondary | ICD-10-CM

## 2019-10-25 DIAGNOSIS — N1831 Chronic kidney disease, stage 3a: Secondary | ICD-10-CM

## 2019-10-25 LAB — CMP (CANCER CENTER ONLY)
ALT: 12 U/L (ref 0–44)
AST: 23 U/L (ref 15–41)
Albumin: 4.1 g/dL (ref 3.5–5.0)
Alkaline Phosphatase: 72 U/L (ref 38–126)
Anion gap: 6 (ref 5–15)
BUN: 35 mg/dL — ABNORMAL HIGH (ref 8–23)
CO2: 25 mmol/L (ref 22–32)
Calcium: 9.6 mg/dL (ref 8.9–10.3)
Chloride: 99 mmol/L (ref 98–111)
Creatinine: 1.21 mg/dL — ABNORMAL HIGH (ref 0.44–1.00)
GFR, Est AFR Am: 54 mL/min — ABNORMAL LOW (ref >60)
GFR, Estimated: 47 mL/min — ABNORMAL LOW (ref >60)
Glucose, Bld: 166 mg/dL — ABNORMAL HIGH (ref 70–99)
Potassium: 4.7 mmol/L (ref 3.5–5.1)
Sodium: 130 mmol/L — ABNORMAL LOW (ref 135–145)
Total Bilirubin: 0.5 mg/dL (ref 0.3–1.2)
Total Protein: 6.6 g/dL (ref 6.5–8.1)

## 2019-10-25 LAB — CBC WITH DIFFERENTIAL (CANCER CENTER ONLY)
Abs Immature Granulocytes: 0.08 10*3/uL — ABNORMAL HIGH (ref 0.00–0.07)
Basophils Absolute: 0.1 10*3/uL (ref 0.0–0.1)
Basophils Relative: 1 %
Eosinophils Absolute: 0.3 10*3/uL (ref 0.0–0.5)
Eosinophils Relative: 4 %
HCT: 31.1 % — ABNORMAL LOW (ref 36.0–46.0)
Hemoglobin: 9.9 g/dL — ABNORMAL LOW (ref 12.0–15.0)
Immature Granulocytes: 1 %
Lymphocytes Relative: 26 %
Lymphs Abs: 1.9 10*3/uL (ref 0.7–4.0)
MCH: 26.8 pg (ref 26.0–34.0)
MCHC: 31.8 g/dL (ref 30.0–36.0)
MCV: 84.3 fL (ref 80.0–100.0)
Monocytes Absolute: 0.8 10*3/uL (ref 0.1–1.0)
Monocytes Relative: 10 %
Neutro Abs: 4.3 10*3/uL (ref 1.7–7.7)
Neutrophils Relative %: 58 %
Platelet Count: 277 10*3/uL (ref 150–400)
RBC: 3.69 MIL/uL — ABNORMAL LOW (ref 3.87–5.11)
RDW: 15.8 % — ABNORMAL HIGH (ref 11.5–15.5)
WBC Count: 7.4 10*3/uL (ref 4.0–10.5)
nRBC: 0 % (ref 0.0–0.2)

## 2019-10-25 LAB — RETICULOCYTES
Immature Retic Fract: 6.5 % (ref 2.3–15.9)
RBC.: 3.66 MIL/uL — ABNORMAL LOW (ref 3.87–5.11)
Retic Count, Absolute: 24.9 10*3/uL (ref 19.0–186.0)
Retic Ct Pct: 0.7 % (ref 0.4–3.1)

## 2019-10-25 LAB — SAVE SMEAR(SSMR), FOR PROVIDER SLIDE REVIEW

## 2019-10-25 MED ORDER — DARBEPOETIN ALFA 200 MCG/0.4ML IJ SOSY
PREFILLED_SYRINGE | INTRAMUSCULAR | Status: AC
  Filled 2019-10-25: qty 0.4

## 2019-10-25 MED ORDER — DIPHENOXYLATE-ATROPINE 2.5-0.025 MG PO TABS: 1.0000 | ORAL_TABLET | Freq: Four times a day (QID) | ORAL | 0 refills | Status: DC | PRN

## 2019-10-25 MED ORDER — DARBEPOETIN ALFA 200 MCG/0.4ML IJ SOSY
200.0000 ug | PREFILLED_SYRINGE | Freq: Once | INTRAMUSCULAR | Status: AC
  Administered 2019-10-25: 200 ug via SUBCUTANEOUS

## 2019-10-25 NOTE — Progress Notes (Signed)
Hematology and Oncology Follow Up Visit  Brittney Pace [id number] 04-27-1955 64 y.o. 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  Overall, she seems to be doing a little bit better.  She had a nice time at [facility].  She was there for vacation.  She really enjoyed her stay there.  She is monitoring her diabetes closely.  She is having no problems with this.  She does have some arthralgias.  This is chronic.  We do have her on the Aranesp.  Hemoglobin today is on 9.9.  We may have to think about increasing the Aranesp dose.  We are also checking iron levels on her.  It is possible she may need some IV iron.  She has had no fever.  She has had no nausea or vomiting.  She has had no leg swelling.  There has been no rashes.    Overall, I would have to say her performance status is ECOG 1-2.     Medications:  Allergies as of 10/25/2019      Reactions   Morphine Anaphylaxis, Shortness Of Breath   Morphine And Related Shortness Of Breath   Penicillins Hives   Has patient had a PCN reaction causing immediate rash, facial/tongue/throat swelling, SOB or lightheadedness with hypotension: Yes Has patient had a PCN reaction causing severe rash involving mucus membranes or skin necrosis: No Has patient had a PCN reaction that required hospitalization No Has patient had a PCN reaction occurring within the last 10 years: No If all of the above answers are "NO", then may proceed with Cephalosporin use.   Tramadol Anaphylaxis   Tramadol-acetaminophen Other (See Comments)   Small vessel heart attack Other reaction(s): Cardiovascular Arrest (ALLERGY/intolerance), Other (See Comments)   Acetaminophen Other (See Comments)   Alters insulin pump readings    Amitriptyline Other (See Comments)   Severe headache/ out of body feeling   Codeine Other (See Comments)   Severe headaches/ out of body feeling   Ibuprofen Other (See Comments)   Messes up CGM reading on glucose monitor   Losartan Cough   Propoxyphene Other (See Comments)   Severe headaches / out of body feeling   Shellfish Allergy Diarrhea, Nausea And Vomiting   Statins Other (See Comments)   Muscle pains Other reaction(s): Other   Sulfamethoxazole Other (See Comments)   Mouth ulcers   Trichophyton Itching, Other (See Comments)   (fungus) sinusitis      Medication List       Accurate as of October 25, 2019  5:05 PM. If you have any questions, ask your nurse or doctor.        AMBULATORY NON FORMULARY MEDICATION Incontinence pads per patient preference, #unlimited, refill x99 Fax to [phone]   arginine 500 MG tablet Take 500 mg by mouth as directed.   Armodafinil 250 MG tablet Take 0.5 tablets (125 mg total) by mouth daily.   aspirin EC 325 MG tablet Take 1 tablet (325 mg total) by mouth daily.   BENADRYL ALLERGY PO Take by mouth.   Biotin 1000 MCG tablet Take 1,000 mcg by mouth 2 (two) times a week.   carvedilol 12.5 MG tablet Commonly known as: COREG Take 1 tablet (12.5 mg total) by mouth 2 (two) times daily with a meal.   ciclopirox 8 %  solution Commonly known as: PENLAC Apply topically at bedtime. Apply over nail and surrounding skin. Apply daily over previous coat. After seven (7) days, may remove with alcohol and continue cycle.   Creon 3000-9500 units Cpep Generic drug: Pancrelipase (Lip-Prot-Amyl) Take by mouth.   cyclobenzaprine 10 MG tablet Commonly known as: FLEXERIL Take 0.5-1 tablets (5-10 mg total) by mouth 3 (three) times daily as needed for muscle spasms. Caution: can cause drowsiness   Dexcom G6 Sensor Misc USE TO CONTINUOUSLY MONITOR BLOOD SUGARS. CHANGE EVERY 10 DAYS   diclofenac sodium 1 % Gel Commonly known as: VOLTAREN Apply  2 g topically 4 (four) times daily. To affected joint.   diphenoxylate-atropine 2.5-0.025 MG tablet Commonly known as: LOMOTIL Take 1 tablet by mouth 4 (four) times daily as needed for diarrhea or loose stools. Started by: [name], MD   ELDERBERRY PO Take by mouth.   fluocinonide 0.05 % external solution Commonly known as: LIDEX APPLY SOLUTION TOPICALLY ONCE DAILY AS NEEDED TO SCALP   folic acid 1 MG tablet Commonly known as: FOLVITE Take by mouth.   gabapentin 100 MG capsule Commonly known as: NEURONTIN Take 1-2 capsules (100-200 mg total) by mouth 3 (three) times daily as needed.   insulin aspart 100 UNIT/ML injection Commonly known as: novoLOG USE VIA INSULIN PUMP TOTAL DAILY INSULIN 40 UNITS   insulin pump Soln Inject into the skin continuous. Novolog insulin   ketoconazole 2 % shampoo Commonly known as: NIZORAL APPLY 1 APPLICATION TOPICALLY 2 (TWO) TIMES A WEEK.   KRILL OIL PO Take by mouth.   Linoleic Acid Conjugated 1000 MG Caps Take 1,000 mg by mouth daily.   loperamide 2 MG tablet Commonly known as: IMODIUM A-D Take 2 mg by mouth as needed.   Melatonin 5 MG Chew Chew 1 tablet by mouth at bedtime.   NEXIUM PO Take 20 mg by mouth every morning.   nitroGLYCERIN 0.4 MG/SPRAY spray Commonly known as: NITROLINGUAL Place 1 spray under the tongue every 5 (five) minutes x 3 doses as needed for chest pain.   NON FORMULARY Chloroella   OVER THE COUNTER MEDICATION Take 750 mg by mouth at bedtime. GABA   OVER THE COUNTER MEDICATION as directed. Magnesium L-Treonate   spironolactone 25 MG tablet Commonly known as: ALDACTONE Take by mouth.   thyroid 60 MG tablet Commonly known as: Armour Thyroid Take 1 tablet (60 mg total) by mouth daily before breakfast.   torsemide 20 MG tablet Commonly known as: [name] TAKE 1 TABLET ONCE DAILY INTHE MORNING. MAY TAKE AN   EXTRA 1/2 TABLET AS        NEEDED What changed: See the new instructions.     Vitamin D3 125 MCG (5000 UT) Tabs Take 1,000 Units by mouth daily.   Xifaxan 550 MG Tabs tablet Generic drug: rifaximin Take 550 mg by mouth 2 (two) times daily.       Allergies:  Allergies  Allergen Reactions  . Morphine Anaphylaxis and Shortness Of Breath  . Morphine And Related Shortness Of Breath  . Penicillins Hives    Has patient had a PCN reaction causing immediate rash, facial/tongue/throat swelling, SOB or lightheadedness with hypotension: Yes Has patient had a PCN reaction causing severe rash involving mucus membranes or skin necrosis: No Has patient had a PCN reaction that required hospitalization No Has patient had a PCN reaction occurring within the last 10 years: No If all of the above answers are "NO", then may proceed with Cephalosporin use.  Marland Kitchen  Tramadol Anaphylaxis  . Tramadol-Acetaminophen Other (See Comments)    Small vessel heart attack Other reaction(s): Cardiovascular Arrest (ALLERGY/intolerance), Other (See Comments)   . Acetaminophen Other (See Comments)    Alters insulin pump readings   . Amitriptyline Other (See Comments)    Severe headache/ out of body feeling  . Codeine Other (See Comments)    Severe headaches/ out of body feeling   . Ibuprofen Other (See Comments)    Messes up CGM reading on glucose monitor    . Losartan Cough  . Propoxyphene Other (See Comments)    Severe headaches / out of body feeling   . Shellfish Allergy Diarrhea and Nausea And Vomiting  . Statins Other (See Comments)    Muscle pains Other reaction(s): Other  . Sulfamethoxazole Other (See Comments)    Mouth ulcers   . Trichophyton Itching and Other (See Comments)    (fungus) sinusitis     Past Medical History, Surgical history, Social history, and Family History were reviewed and updated.  Review of Systems: Review of Systems  Constitutional: Positive for malaise/fatigue.  HENT: Positive for ear discharge.   Eyes: Positive for blurred vision.   Respiratory: Negative.   Cardiovascular: Positive for claudication.  Gastrointestinal: Positive for nausea.  Genitourinary: Positive for dysuria and urgency.  Musculoskeletal: Positive for joint pain, myalgias and neck pain.  Skin: Negative.   Neurological: Positive for dizziness, focal weakness and headaches.  Endo/Heme/Allergies: Negative.   Psychiatric/Behavioral: Negative.      Physical Exam:  weight is 97 lb (44 kg). Her oral temperature is 98 F (36.7 C). Her blood pressure is 157/65 (abnormal) and her pulse is 64. Her respiration is 16 and oxygen saturation is 100%.   Wt Readings from Last 3 Encounters:  10/25/19 97 lb (44 kg)  09/04/19 99 lb 1.3 oz (44.9 kg)  07/30/19 101 lb 11.2 oz (46.1 kg)    Physical Exam Vitals reviewed.  HENT:     Head: Normocephalic and atraumatic.  Eyes:     Pupils: Pupils are equal, round, and reactive to light.  Cardiovascular:     Rate and Rhythm: Normal rate and regular rhythm.     Heart sounds: Normal heart sounds.  Pulmonary:     Effort: Pulmonary effort is normal.     Breath sounds: Normal breath sounds.  Abdominal:     General: Bowel sounds are normal.     Palpations: Abdomen is soft.  Musculoskeletal:        General: No tenderness or deformity. Normal range of motion.     Cervical back: Normal range of motion.  Lymphadenopathy:     Cervical: No cervical adenopathy.  Skin:    General: Skin is warm and dry.     Findings: No erythema or rash.  Neurological:     Mental Status: She is alert and oriented to person, place, and time.  Psychiatric:        Behavior: Behavior normal.        Thought Content: Thought content normal.        Judgment: Judgment normal.      Lab Results  Component Value Date   WBC 7.4 10/25/2019   HGB 9.9 (L) 10/25/2019   HCT 31.1 (L) 10/25/2019   MCV 84.3 10/25/2019   PLT 277 10/25/2019   Lab Results  Component Value Date   FERRITIN 30 09/04/2019   IRON 72 09/04/2019   TIBC 311 09/04/2019    UIBC 239 09/04/2019   IRONPCTSAT 23 09/04/2019  Lab Results  Component Value Date   RETICCTPCT 0.7 10/25/2019   RBC 3.69 (L) 10/25/2019   RBC 3.66 (L) 10/25/2019   No results found for: KPAFRELGTCHN, LAMBDASER, KAPLAMBRATIO No results found for: IGGSERUM, IGA, IGMSERUM No results found for: Kathrynn Ducking, MSPIKE, SPEI   Chemistry      Component Value Date/Time   NA 130 (L) 10/25/2019 1456   NA 138 07/17/2017 1128   K 4.7 10/25/2019 1456   CL 99 10/25/2019 1456   CO2 25 10/25/2019 1456   BUN 35 (H) 10/25/2019 1456   BUN 23 07/17/2017 1128   CREATININE 1.21 (H) 10/25/2019 1456   CREATININE 0.99 07/20/2018 0821      Component Value Date/Time   CALCIUM 9.6 10/25/2019 1456   ALKPHOS 72 10/25/2019 1456   AST 23 10/25/2019 1456   ALT 12 10/25/2019 1456   BILITOT 0.5 10/25/2019 1456       Impression and Plan: Ms. Brownfield is a very pleasant 64 yo female with multifactorial anemia. I do not worry about iron deficiency.  Again her arm saturation on 20%.  We will have to see if it is lower.  If so, we will definitely need to give her IV iron.  Her hemoglobin is a little bit better.  With this, hopefully the Aranesp is helping a little bit.  If her iron is low, I would think that her hemoglobin will improve nicely with supplemental IV iron.  She does have a lot of health issues.  Hopefully, her family doctor will be able to help out with this.  We will plan to get her back to see [place] in another month or so.   [name], MD 10/1/20215:05 PM

## 2019-10-25 NOTE — Patient Instructions (Signed)
Darbepoetin Alfa injection What is this medicine? DARBEPOETIN ALFA (dar be POE e tin AL fa) helps your body make more red blood cells. It is used to treat anemia caused by chronic kidney failure and chemotherapy. This medicine may be used for other purposes; ask your health care provider or pharmacist if you have questions. COMMON BRAND NAME(S): Aranesp What should I tell my health care provider before I take this medicine? They need to know if you have any of these conditions:  blood clotting disorders or history of blood clots  cancer patient not on chemotherapy  cystic fibrosis  heart disease, such as angina, heart failure, or a history of a heart attack  hemoglobin level of 12 g/dL or greater  high blood pressure  low levels of folate, iron, or vitamin B12  seizures  an unusual or allergic reaction to darbepoetin, erythropoietin, albumin, hamster proteins, latex, other medicines, foods, dyes, or preservatives  pregnant or trying to get pregnant  breast-feeding How should I use this medicine? This medicine is for injection into a vein or under the skin. It is usually given by a health care professional in a hospital or clinic setting. If you get this medicine at home, you will be taught how to prepare and give this medicine. Use exactly as directed. Take your medicine at regular intervals. Do not take your medicine more often than directed. It is important that you put your used needles and syringes in a special sharps container. Do not put them in a trash can. If you do not have a sharps container, call your pharmacist or healthcare provider to get one. A special MedGuide will be given to you by the pharmacist with each prescription and refill. Be sure to read this information carefully each time. Talk to your pediatrician regarding the use of this medicine in children. While this medicine may be used in children as young as 1 month of age for selected conditions, precautions do  apply. Overdosage: If you think you have taken too much of this medicine contact a poison control center or emergency room at once. NOTE: This medicine is only for you. Do not share this medicine with others. What if I miss a dose? If you miss a dose, take it as soon as you can. If it is almost time for your next dose, take only that dose. Do not take double or extra doses. What may interact with this medicine? Do not take this medicine with any of the following medications:  epoetin alfa This list may not describe all possible interactions. Give your health care provider a list of all the medicines, herbs, non-prescription drugs, or dietary supplements you use. Also tell them if you smoke, drink alcohol, or use illegal drugs. Some items may interact with your medicine. What should I watch for while using this medicine? Your condition will be monitored carefully while you are receiving this medicine. You may need blood work done while you are taking this medicine. This medicine may cause a decrease in vitamin B6. You should make sure that you get enough vitamin B6 while you are taking this medicine. Discuss the foods you eat and the vitamins you take with your health care professional. What side effects may I notice from receiving this medicine? Side effects that you should report to your doctor or health care professional as soon as possible:  allergic reactions like skin rash, itching or hives, swelling of the face, lips, or tongue  breathing problems  changes in   vision  chest pain  confusion, trouble speaking or understanding  feeling faint or lightheaded, falls  high blood pressure  muscle aches or pains  pain, swelling, warmth in the leg  rapid weight gain  severe headaches  sudden numbness or weakness of the face, arm or leg  trouble walking, dizziness, loss of balance or coordination  seizures (convulsions)  swelling of the ankles, feet, hands  unusually weak or  tired Side effects that usually do not require medical attention (report to your doctor or health care professional if they continue or are bothersome):  diarrhea  fever, chills (flu-like symptoms)  headaches  nausea, vomiting  redness, stinging, or swelling at site where injected This list may not describe all possible side effects. Call your doctor for medical advice about side effects. You may report side effects to FDA at 1-800-FDA-1088. Where should I keep my medicine? Keep out of the reach of children. Store in a refrigerator between 2 and 8 degrees C (36 and 46 degrees F). Do not freeze. Do not shake. Throw away any unused portion if using a single-dose vial. Throw away any unused medicine after the expiration date. NOTE: This sheet is a summary. It may not cover all possible information. If you have questions about this medicine, talk to your doctor, pharmacist, or health care provider.  2020 [employer]vier/Gold Standard (2017-01-25 16:44:20)  

## 2019-10-28 LAB — IRON AND TIBC
Iron: 133 ug/dL (ref 41–142)
Saturation Ratios: 45 % (ref 21–57)
TIBC: 295 ug/dL (ref 236–444)
UIBC: 162 ug/dL (ref 120–384)

## 2019-10-28 LAB — FERRITIN: Ferritin: 87 ng/mL (ref 11–307)

## 2019-10-30 ENCOUNTER — Telehealth (INDEPENDENT_AMBULATORY_CARE_PROVIDER_SITE_OTHER): Payer: 59 | Admitting: Osteopathic Medicine

## 2019-10-30 DIAGNOSIS — Z8673 Personal history of transient ischemic attack (TIA), and cerebral infarction without residual deficits: Secondary | ICD-10-CM | POA: Diagnosis not present

## 2019-10-30 DIAGNOSIS — G479 Sleep disorder, unspecified: Secondary | ICD-10-CM

## 2019-10-30 DIAGNOSIS — R5383 Other fatigue: Secondary | ICD-10-CM | POA: Diagnosis not present

## 2019-10-30 DIAGNOSIS — E1059 Type 1 diabetes mellitus with other circulatory complications: Secondary | ICD-10-CM | POA: Diagnosis not present

## 2019-10-30 MED ORDER — ARMODAFINIL 250 MG PO TABS
250.0000 mg | ORAL_TABLET | Freq: Every day | ORAL | 1 refills | Status: DC
Start: 2019-10-30 — End: 2019-12-05

## 2019-10-30 NOTE — Progress Notes (Signed)
Virtual Visit via Phone  I connected with      Brittney Pace on 10/30/19 at 11:41 AM  by a telemedicine application and verified that I am speaking with the correct person using two identifiers.  Patient is at home I am in office   I discussed the limitations of evaluation and management by telemedicine and the availability of in person appointments. The patient expressed understanding and agreed to proceed.  History of Present Illness: Brittney Pace is a 64 y.o. female who would like to discuss chronic conditions, worsening fatigue, request neurology second opinion regarding prevention of further strokes / sleep problems.       Observations/Objective: There were no vitals taken for this visit. BP Readings from Last 3 Encounters:  10/25/19 (!) 157/65  09/04/19 (!) 150/60  07/30/19 (!) 183/72   Exam: Normal Speech.  NAD  Lab and Radiology Results No results found for this or any previous visit (from the past 72 hour(s)). No results found.     Assessment and Plan: 64 y.o. female with The primary encounter diagnosis was Sleep disorder. Diagnoses of Other fatigue, Type 1 diabetes mellitus with other circulatory complication ([place]), and History of multiple cerebrovascular accidents (CVAs) were also pertinent to this visit.   PDMP not reviewed this encounter. Orders Placed This Encounter  Procedures  . Ambulatory referral to Neurology    Referral Priority:   Routine    Referral Type:   Consultation    Referral Reason:   Specialty Services Required    Requested Specialty:   Neurology    Number of Visits Requested:   1  . Ambulatory referral to Ophthalmology    Referral Priority:   Routine    Referral Type:   Consultation    Referral Reason:   Specialty Services Required    Requested Specialty:   Ophthalmology    Number of Visits Requested:   1   Meds ordered this encounter  Medications  . Armodafinil 250 MG tablet    Sig: Take 1 tablet (250 mg total) by  mouth daily. OR can take 0.5 tablet (125 mg) po bid    Dispense:  90 tablet    Refill:  1      Follow Up Instructions: Return in about 3 months (around 01/30/2020) for VIRTUAL VISIT RECHECK ON SLEEP ISSUES / RX ARMODAFINIL . CALL SOONER IF NEEDED     I discussed the assessment and treatment plan with the patient. The patient was provided an opportunity to ask questions and all were answered. The patient agreed with the plan and demonstrated an understanding of the instructions.   The patient was advised to call back or seek an in-person evaluation if any new concerns, if symptoms worsen or if the condition fails to improve as anticipated.  30 minutes of non-face-to-face time was provided during this encounter.      . . . . . . . . . . . . . Marland Kitchen                   Historical information moved to improve visibility of documentation.  Past Medical History:  Diagnosis Date  . Anemia   . Arthritis   . 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 Chronic kidney disease    stage 3  . Coronary artery disease   . Depression   . Diabetes mellitus without complication (HCC)    Type 1  . Diabetic retinopathy ([place])   .  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 Family history of adverse reaction to anesthesia    mother: " while she was under she stopped breathing."  . Fibromyalgia   . Gastroparesis   . GERD (gastroesophageal reflux disease)   . Headache    migraines  . Hypothyroidism   . IBS (irritable bowel syndrome)   . Idiopathic edema   . Iron deficiency anemia 09/04/2019  . Lyme disease   . Mitral valve prolapse   . Myocardial infarction ([place])    1 major in 1999 and 2 minor " small vessel disease."  . Osteoporosis   . Peripheral neuropathy   . Peripheral vascular disease ([place])   . 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 Stroke [facility])    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" " the second stroke was a lacunar    Past  Surgical History:  Procedure Laterality Date  . ABDOMINAL HYSTERECTOMY    . APPENDECTOMY    . BREAST SURGERY     B/L biopsy and lumpectomy   . CARDIAC CATHETERIZATION    . CARPAL TUNNEL RELEASE    . CATARACT EXTRACTION W/ INTRAOCULAR LENS IMPLANT     right eye  . COLONOSCOPY W/ BIOPSIES AND POLYPECTOMY    . CORONARY ARTERY BYPASS GRAFT    . coronary artery stents     at [facility] and [facility]  . ECTOPIC PREGNANCY SURGERY    . NASAL SEPTUM SURGERY    . OPEN REDUCTION INTERNAL FIXATION (ORIF) DISTAL RADIAL FRACTURE Right 05/06/2015   Procedure: OPEN REDUCTION INTERNAL FIXATION (ORIF) RIGHT DISTAL RADIAL FRACTURE AND REPAIRS AS NEEDED;  Surgeon: Iran Planas, MD;  Location: [place];  Service: Orthopedics;  Laterality: Right;  . ORIF WRIST FRACTURE Left 11/05/2014  . ORIF WRIST FRACTURE Left 11/05/2014   Procedure: OPEN REDUCTION INTERNAL FIXATION (ORIF) LEFT WRIST FRACTURE AND REPAIR AS INDICATED;  Surgeon: Iran Planas, MD;  Location: [place];  Service: Orthopedics;  Laterality: Left;  . TRIGGER FINGER RELEASE     Social History   Tobacco Use  . Smoking status: Former Smoker    Types: Cigarettes  . Smokeless tobacco: Never Used  . Tobacco comment:  " Quit smoking cigarettes in 20's "  Substance Use Topics  . Alcohol use: Yes    Comment: occasional beer or wine   family history includes Breast cancer in her mother and sister; Heart disease in her father; Hypertension in her father.  Medications: Current Outpatient Medications  Medication Sig Dispense Refill  . AMBULATORY NON FORMULARY MEDICATION Incontinence pads per patient preference, #unlimited, refill x99 Fax to [phone] 1 Units prn  . arginine 500 MG tablet Take 500 mg by mouth as directed.     . Armodafinil 250 MG tablet Take 1 tablet (250 mg total) by mouth daily. OR can take 0.5 tablet (125 mg) po bid 90 tablet 1  . aspirin EC 325 MG tablet Take 1 tablet (325 mg total) by mouth daily. 30 tablet 0  . Biotin 1000 MCG tablet Take  1,000 mcg by mouth 2 (two) times a week.    . carvedilol (COREG) 12.5 MG tablet Take 1 tablet (12.5 mg total) by mouth 2 (two) times daily with a meal. 180 tablet 3  . Cholecalciferol (VITAMIN D3) 5000 UNITS TABS Take 1,000 Units by mouth daily.     . ciclopirox (PENLAC) 8 % solution Apply topically at bedtime. Apply over nail and surrounding skin. Apply daily over previous coat. After  seven (7) days, may remove with alcohol and continue cycle. 6.6 mL 0  . Continuous Blood Gluc Sensor (DEXCOM G6 SENSOR) MISC USE TO CONTINUOUSLY MONITOR BLOOD SUGARS. CHANGE EVERY 10 DAYS    . cyclobenzaprine (FLEXERIL) 10 MG tablet Take 0.5-1 tablets (5-10 mg total) by mouth 3 (three) times daily as needed for muscle spasms. Caution: can cause drowsiness 60 tablet 1  . diclofenac sodium (VOLTAREN) 1 % GEL Apply 2 g topically 4 (four) times daily. To affected joint. 100 g 11  . diphenhydrAMINE HCl (BENADRYL ALLERGY PO) Take by mouth.    . diphenoxylate-atropine (LOMOTIL) 2.5-0.025 MG tablet Take 1 tablet by mouth 4 (four) times daily as needed for diarrhea or loose stools. 90 tablet 0  . ELDERBERRY PO Take by mouth.    . Esomeprazole Magnesium (NEXIUM PO) Take 20 mg by mouth every morning.     . fluocinonide (LIDEX) 0.05 % external solution APPLY SOLUTION TOPICALLY ONCE DAILY AS NEEDED TO SCALP    . folic acid (FOLVITE) 1 MG tablet Take by mouth.    . gabapentin (NEURONTIN) 100 MG capsule Take 1-2 capsules (100-200 mg total) by mouth 3 (three) times daily as needed. 180 capsule 5  . insulin aspart (NOVOLOG) 100 UNIT/ML injection USE VIA INSULIN PUMP TOTAL DAILY INSULIN 40 UNITS    . Insulin Human (INSULIN PUMP) SOLN Inject into the skin continuous. Novolog insulin    . ketoconazole (NIZORAL) 2 % shampoo APPLY 1 APPLICATION TOPICALLY 2 (TWO) TIMES A WEEK. 240 mL 1  . KRILL OIL PO Take by mouth.    . Linoleic Acid Conjugated 1000 MG CAPS Take 1,000 mg by mouth daily.     Marland Kitchen loperamide (IMODIUM A-D) 2 MG tablet Take 2  mg by mouth as needed.    . Melatonin 5 MG CHEW Chew 1 tablet by mouth at bedtime.    . nitroGLYCERIN (NITROLINGUAL) 0.4 MG/SPRAY spray Place 1 spray under the tongue every 5 (five) minutes x 3 doses as needed for chest pain. (Patient not taking: Reported on 07/30/2019) 12 g 1  . NON FORMULARY Chloroella    . OVER THE COUNTER MEDICATION Take 750 mg by mouth at bedtime. GABA    . OVER THE COUNTER MEDICATION as directed. Magnesium L-Treonate    . Pancrelipase, Lip-Prot-Amyl, (CREON) 3000-9500 units CPEP Take by mouth.    . spironolactone (ALDACTONE) 25 MG tablet Take by mouth.    . thyroid (ARMOUR THYROID) 60 MG tablet Take 1 tablet (60 mg total) by mouth daily before breakfast. 90 tablet 3  . torsemide ([name]) 20 MG tablet TAKE 1 TABLET ONCE DAILY INTHE MORNING. MAY TAKE AN   EXTRA 1/2 TABLET AS        NEEDED (Patient taking differently: every other day. TAKE 1 TABLET ONCE DAILY INTHE MORNING. MAY TAKE AN   EXTRA 1/2 TABLET AS NEEDED) 135 tablet 0  . XIFAXAN 550 MG TABS tablet Take 550 mg by mouth 2 (two) times daily.     No current facility-administered medications for this visit.   Allergies  Allergen Reactions  . Morphine Anaphylaxis and Shortness Of Breath  . Morphine And Related Shortness Of Breath  . Penicillins Hives    Has patient had a PCN reaction causing immediate rash, facial/tongue/throat swelling, SOB or lightheadedness with hypotension: Yes Has patient had a PCN reaction causing severe rash involving mucus membranes or skin necrosis: No Has patient had a PCN reaction that required hospitalization No Has patient had a PCN reaction occurring  within the last 10 years: No If all of the above answers are "NO", then may proceed with Cephalosporin use.  . Tramadol Anaphylaxis  . Tramadol-Acetaminophen Other (See Comments)    Small vessel heart attack Other reaction(s): Cardiovascular Arrest (ALLERGY/intolerance), Other (See Comments)   . Acetaminophen Other (See Comments)     Alters insulin pump readings   . Amitriptyline Other (See Comments)    Severe headache/ out of body feeling  . Codeine Other (See Comments)    Severe headaches/ out of body feeling   . Ibuprofen Other (See Comments)    Messes up CGM reading on glucose monitor    . Losartan Cough  . Propoxyphene Other (See Comments)    Severe headaches / out of body feeling   . Shellfish Allergy Diarrhea and Nausea And Vomiting  . Statins Other (See Comments)    Muscle pains Other reaction(s): Other  . Sulfamethoxazole Other (See Comments)    Mouth ulcers   . Trichophyton Itching and Other (See Comments)    (fungus) sinusitis

## 2019-11-07 ENCOUNTER — Telehealth (INDEPENDENT_AMBULATORY_CARE_PROVIDER_SITE_OTHER): Payer: 59 | Admitting: Osteopathic Medicine

## 2019-11-07 ENCOUNTER — Encounter: Payer: Self-pay | Admitting: Osteopathic Medicine

## 2019-11-07 VITALS — BP 101/51 | Wt 94.0 lb

## 2019-11-07 DIAGNOSIS — I2510 Atherosclerotic heart disease of native coronary artery without angina pectoris: Secondary | ICD-10-CM | POA: Diagnosis not present

## 2019-11-07 DIAGNOSIS — A6000 Herpesviral infection of urogenital system, unspecified: Secondary | ICD-10-CM | POA: Insufficient documentation

## 2019-11-07 DIAGNOSIS — A6004 Herpesviral vulvovaginitis: Secondary | ICD-10-CM

## 2019-11-07 DIAGNOSIS — A6002 Herpesviral infection of other male genital organs: Secondary | ICD-10-CM

## 2019-11-07 MED ORDER — LIDOCAINE-PRILOCAINE 2.5-2.5 % EX CREA: 1.0000 "application " | TOPICAL_CREAM | CUTANEOUS | 0 refills | Status: DC | PRN

## 2019-11-07 MED ORDER — VALACYCLOVIR HCL 500 MG PO TABS: 1000.0000 mg | ORAL_TABLET | Freq: Every day | ORAL | 1 refills | Status: AC

## 2019-11-07 NOTE — Patient Instructions (Signed)
   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

## 2019-11-07 NOTE — Progress Notes (Signed)
Virtual Visit via Video (App used: MyChart) Note  I connected with      Brittney Pace on 11/07/19 at 7:43 AM  by a telemedicine application and verified that I am speaking with the correct person using two identifiers.  Patient is at home I am in office   I discussed the limitations of evaluation and management by telemedicine and the availability of in person appointments. The patient expressed understanding and agreed to proceed.  History of Present Illness: Brittney Pace is a 64 y.o. female who would like to discuss genital HSV outbreak. No rash but burning feeling similar to previous outbreaks. Was on suppressive therapy years ago but hasn't needed this. Recent life stressors may have triggered this episode. Recent BUN/Cr reviewed.        Observations/Objective: BP (!) 101/51   Wt 94 lb (42.6 kg)   BMI 18.36 kg/m  BP Readings from Last 3 Encounters:  11/07/19 (!) 101/51  10/25/19 (!) 157/65  09/04/19 (!) 150/60   Exam: Normal Speech.  NAD  Lab and Radiology Results No results found for this or any previous visit (from the past 72 hour(s)). No results found.     Assessment and Plan: 64 y.o. 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   PDMP not reviewed this encounter. No orders of the defined types were placed in this encounter.  Meds ordered this encounter  Medications  . valACYclovir (VALTREX) 500 MG tablet    Sig: Take 2 tablets (1,000 mg total) by mouth daily for 5 days.    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 If MyChart not available,  pt was given option for info via personal e-mail w/ no guarantee of protected health info over unsecured e-mail communication, and MyChart sign-up instructions were sent to patient.   Follow Up Instructions: Return if symptoms worsen or fail to improve.    I discussed the assessment and treatment plan with the patient. The patient was provided an opportunity to ask questions and all were answered. The patient agreed with the plan and demonstrated an understanding of the instructions.   The patient was advised to call back or seek an in-person evaluation if any new concerns, if symptoms worsen or if the condition fails to improve as anticipated.  20 minutes of non-face-to-face time was provided during this encounter.      . . . . . . . . . . . . . Marland Kitchen                   Historical information moved to improve visibility of documentation.  Past Medical History:  Diagnosis Date  . Anemia   . Arthritis   . 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 Chronic kidney disease    stage 3  . Coronary artery disease   . Depression   . Diabetes mellitus without complication (HCC)    Type 1  . Diabetic retinopathy ([place])   . 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  Family history of adverse reaction to anesthesia    mother: " while she was under she stopped breathing."  . Fibromyalgia   . Gastroparesis   . GERD (gastroesophageal reflux disease)   . Headache    migraines  . Hypothyroidism   . IBS (irritable bowel syndrome)   . Idiopathic edema   . Iron deficiency anemia 09/04/2019  . Lyme disease   . Mitral valve prolapse   . Myocardial infarction ([place])    1 major in 1999 and 2 minor " small vessel disease."  . Osteoporosis   . Peripheral neuropathy   . Peripheral vascular disease ([place])   . 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 Stroke [facility])    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" " the second stroke was a lacunar    Past  Surgical History:  Procedure Laterality Date  . ABDOMINAL HYSTERECTOMY    . APPENDECTOMY    . BREAST SURGERY     B/L biopsy and lumpectomy   . CARDIAC CATHETERIZATION    . CARPAL TUNNEL RELEASE    . CATARACT EXTRACTION W/ INTRAOCULAR LENS IMPLANT     right eye  . COLONOSCOPY W/ BIOPSIES AND POLYPECTOMY    . CORONARY ARTERY BYPASS GRAFT    . coronary artery stents     at [facility] and [facility]  . ECTOPIC PREGNANCY SURGERY    . NASAL SEPTUM SURGERY    . OPEN REDUCTION INTERNAL FIXATION (ORIF) DISTAL RADIAL FRACTURE Right 05/06/2015   Procedure: OPEN REDUCTION INTERNAL FIXATION (ORIF) RIGHT DISTAL RADIAL FRACTURE AND REPAIRS AS NEEDED;  Surgeon: Iran Planas, MD;  Location: [place];  Service: Orthopedics;  Laterality: Right;  . ORIF WRIST FRACTURE Left 11/05/2014  . ORIF WRIST FRACTURE Left 11/05/2014   Procedure: OPEN REDUCTION INTERNAL FIXATION (ORIF) LEFT WRIST FRACTURE AND REPAIR AS INDICATED;  Surgeon: Iran Planas, MD;  Location: [place];  Service: Orthopedics;  Laterality: Left;  . TRIGGER FINGER RELEASE     Social History   Tobacco Use  . Smoking status: Former Smoker    Types: Cigarettes  . Smokeless tobacco: Never Used  . Tobacco comment:  " Quit smoking cigarettes in 20's "  Substance Use Topics  . Alcohol use: Yes    Comment: occasional beer or wine   family history includes Breast cancer in her mother and sister; Heart disease in her father; Hypertension in her father.  Medications: Current Outpatient Medications  Medication Sig Dispense Refill  . AMBULATORY NON FORMULARY MEDICATION Incontinence pads per patient preference, #unlimited, refill x99 Fax to [phone] 1 Units prn  . arginine 500 MG tablet Take 500 mg by mouth as directed.     . Armodafinil 250 MG tablet Take 1 tablet (250 mg total) by mouth daily. OR can take 0.5 tablet (125 mg) po bid 90 tablet 1  . aspirin EC 325 MG tablet Take 1 tablet (325 mg total) by mouth daily. 30 tablet 0  . Biotin 1000 MCG tablet Take  1,000 mcg by mouth 2 (two) times a week.    . carvedilol (COREG) 12.5 MG tablet Take 1 tablet (12.5 mg total) by mouth 2 (two) times daily with a meal. 180 tablet 3  . Cholecalciferol (VITAMIN D3) 5000 UNITS TABS Take 1,000 Units by mouth daily.     . ciclopirox (PENLAC) 8 % solution Apply topically at bedtime. Apply over nail and surrounding skin. Apply daily over previous coat. After seven (7) days, may remove with  alcohol and continue cycle. 6.6 mL 0  . Continuous Blood Gluc Sensor (DEXCOM G6 SENSOR) MISC USE TO CONTINUOUSLY MONITOR BLOOD SUGARS. CHANGE EVERY 10 DAYS    . cyclobenzaprine (FLEXERIL) 10 MG tablet Take 0.5-1 tablets (5-10 mg total) by mouth 3 (three) times daily as needed for muscle spasms. Caution: can cause drowsiness 60 tablet 1  . diclofenac sodium (VOLTAREN) 1 % GEL Apply 2 g topically 4 (four) times daily. To affected joint. 100 g 11  . diphenhydrAMINE HCl (BENADRYL ALLERGY PO) Take by mouth.    . diphenoxylate-atropine (LOMOTIL) 2.5-0.025 MG tablet Take 1 tablet by mouth 4 (four) times daily as needed for diarrhea or loose stools. 90 tablet 0  . ELDERBERRY PO Take by mouth.    . Esomeprazole Magnesium (NEXIUM PO) Take 20 mg by mouth every morning.     . fluocinonide (LIDEX) 0.05 % external solution APPLY SOLUTION TOPICALLY ONCE DAILY AS NEEDED TO SCALP    . folic acid (FOLVITE) 1 MG tablet Take by mouth.    . gabapentin (NEURONTIN) 100 MG capsule Take 1-2 capsules (100-200 mg total) by mouth 3 (three) times daily as needed. 180 capsule 5  . insulin aspart (NOVOLOG) 100 UNIT/ML injection USE VIA INSULIN PUMP TOTAL DAILY INSULIN 40 UNITS    . Insulin Human (INSULIN PUMP) SOLN Inject into the skin continuous. Novolog insulin    . ketoconazole (NIZORAL) 2 % shampoo APPLY 1 APPLICATION TOPICALLY 2 (TWO) TIMES A WEEK. 240 mL 1  . KRILL OIL PO Take by mouth.    . Linoleic Acid Conjugated 1000 MG CAPS Take 1,000 mg by mouth daily.     Marland Kitchen loperamide (IMODIUM A-D) 2 MG tablet Take 2  mg by mouth as needed.    . Melatonin 5 MG CHEW Chew 1 tablet by mouth at bedtime.    . nitroGLYCERIN (NITROLINGUAL) 0.4 MG/SPRAY spray Place 1 spray under the tongue every 5 (five) minutes x 3 doses as needed for chest pain. 12 g 1  . NON FORMULARY Chloroella    . OVER THE COUNTER MEDICATION Take 750 mg by mouth at bedtime. GABA    . OVER THE COUNTER MEDICATION as directed. Magnesium L-Treonate    . Pancrelipase, Lip-Prot-Amyl, (CREON) 3000-9500 units CPEP Take by mouth.    . spironolactone (ALDACTONE) 25 MG tablet Take by mouth.    . thyroid (ARMOUR THYROID) 60 MG tablet Take 1 tablet (60 mg total) by mouth daily before breakfast. 90 tablet 3  . torsemide ([name]) 20 MG tablet TAKE 1 TABLET ONCE DAILY INTHE MORNING. MAY TAKE AN   EXTRA 1/2 TABLET AS        NEEDED (Patient taking differently: every other day. TAKE 1 TABLET ONCE DAILY INTHE MORNING. MAY TAKE AN   EXTRA 1/2 TABLET AS NEEDED) 135 tablet 0  . XIFAXAN 550 MG TABS tablet Take 550 mg by mouth 2 (two) times daily.    . valACYclovir (VALTREX) 500 MG tablet Take 2 tablets (1,000 mg total) by mouth daily for 5 days. 20 tablet 1   No current facility-administered medications for this visit.   Allergies  Allergen Reactions  . Morphine Anaphylaxis and Shortness Of Breath  . Morphine And Related Shortness Of Breath  . Penicillins Hives    Has patient had a PCN reaction causing immediate rash, facial/tongue/throat swelling, SOB or lightheadedness with hypotension: Yes Has patient had a PCN reaction causing severe rash involving mucus membranes or skin necrosis: No Has patient had a PCN reaction that  required hospitalization No Has patient had a PCN reaction occurring within the last 10 years: No If all of the above answers are "NO", then may proceed with Cephalosporin use.  . Tramadol Anaphylaxis  . Tramadol-Acetaminophen Other (See Comments)    Small vessel heart attack Other reaction(s): Cardiovascular Arrest (ALLERGY/intolerance),  Other (See Comments)   . Acetaminophen Other (See Comments)    Alters insulin pump readings   . Amitriptyline Other (See Comments)    Severe headache/ out of body feeling  . Codeine Other (See Comments)    Severe headaches/ out of body feeling   . Ibuprofen Other (See Comments)    Messes up CGM reading on glucose monitor    . Losartan Cough  . Propoxyphene Other (See Comments)    Severe headaches / out of body feeling   . Shellfish Allergy Diarrhea and Nausea And Vomiting  . Statins Other (See Comments)    Muscle pains Other reaction(s): Other  . Sulfamethoxazole Other (See Comments)    Mouth ulcers   . Trichophyton Itching and Other (See Comments)    (fungus) sinusitis

## 2019-11-08 MED ORDER — LIDOCAINE-PRILOCAINE 2.5-2.5 % EX CREA: 1.0000 "application " | TOPICAL_CREAM | CUTANEOUS | 0 refills | Status: DC | PRN

## 2019-11-08 NOTE — Addendum Note (Signed)
Addended by: Narda Rutherford on: 11/08/2019 07:34 AM   Modules accepted: Orders

## 2019-11-13 ENCOUNTER — Telehealth: Payer: Self-pay

## 2019-11-13 DIAGNOSIS — M797 Fibromyalgia: Secondary | ICD-10-CM

## 2019-11-13 MED ORDER — CYCLOBENZAPRINE HCL 10 MG PO TABS: 5.0000 mg | ORAL_TABLET | Freq: Three times a day (TID) | ORAL | 1 refills | Status: DC | PRN

## 2019-11-13 NOTE — Telephone Encounter (Signed)
Refill sent to pharmacy on file, [place]

## 2019-11-13 NOTE — Telephone Encounter (Signed)
Brittney Pace called and left a message stating she has pulled out her back. She would like a refill on cyclobenzaprine. Not on current medication list.

## 2019-11-14 NOTE — Telephone Encounter (Signed)
Patient advised.

## 2019-11-15 ENCOUNTER — Emergency Department (INDEPENDENT_AMBULATORY_CARE_PROVIDER_SITE_OTHER): Payer: 59

## 2019-11-15 ENCOUNTER — Emergency Department (INDEPENDENT_AMBULATORY_CARE_PROVIDER_SITE_OTHER)
Admission: RE | Admit: 2019-11-15 | Discharge: 2019-11-15 | Disposition: A | Discharge status: Home / Self Care | Payer: 59 | Source: Ambulatory Visit

## 2019-11-15 ENCOUNTER — Other Ambulatory Visit: Payer: Self-pay

## 2019-11-15 VITALS — BP 177/80 | HR 64 | Temp 98.0°F | Resp 20 | Ht 59.0 in | Wt 94.0 lb

## 2019-11-15 DIAGNOSIS — S39012A Strain of muscle, fascia and tendon of lower back, initial encounter: Secondary | ICD-10-CM | POA: Diagnosis not present

## 2019-11-15 DIAGNOSIS — M5459 Other low back pain: Secondary | ICD-10-CM | POA: Diagnosis not present

## 2019-11-15 DIAGNOSIS — R109 Unspecified abdominal pain: Secondary | ICD-10-CM | POA: Diagnosis not present

## 2019-11-15 LAB — POCT URINALYSIS DIP (MANUAL ENTRY)
Bilirubin, UA: NEGATIVE
Blood, UA: NEGATIVE
Glucose, UA: 100 mg/dL — AB
Ketones, POC UA: NEGATIVE mg/dL
Leukocytes, UA: NEGATIVE
Nitrite, UA: NEGATIVE
Protein Ur, POC: >=300 mg/dL — AB
Spec Grav, UA: >=1.03 — AB (ref 1.010–1.025)
Urobilinogen, UA: 0.2 E.U./dL
pH, UA: 7 (ref 5.0–8.0)

## 2019-11-15 MED ORDER — ZIKS ARTHRITIS PAIN RELIEF 0.025-1-12 % EX CREA: 1.0000 "application " | TOPICAL_CREAM | Freq: Three times a day (TID) | CUTANEOUS | 0 refills | Status: DC | PRN

## 2019-11-15 NOTE — Discharge Instructions (Signed)
  Please call to schedule a follow up appointment next week with primary care or sports medicine for further evaluation and treatment of back pain if not improving over the weekend.

## 2019-11-15 NOTE — ED Triage Notes (Signed)
Pt presents for severe lower right back pain x2 days. She was moving a bag of clothes at home and reports that she has been hurting since then. Pt has Flexeril prescribed and took the same for the pain, as well as gabapentin. Both medications have not helped.

## 2019-11-15 NOTE — ED Provider Notes (Signed)
[facility]    CSN: [id number] Arrival date & time: 11/15/19  1306      History   Chief Complaint Chief Complaint  Patient presents with  . Back Pain    lower right     HPI Brittney Pace is a 64 y.o. female.   HPI  Brittney Pace is a 64 y.o. female presenting to UC with c/o Right lower back pain that started 2 days ago after lifting a heavy bag of winter clothing at home.  Pain is aching and sore, worse with certain movements. She has been taking cyclobenzaprine and gabapentin without relief. Pt does have a hx of CKD, wants to make sure her kidneys are not contributing to her pain. Denies recent change in urinary symptoms.  Denies fever, chills, n/v/d. No radiation of pain or numbness in arms or legs.  Hx of upper back pain in the past but not lower.    Past Medical History:  Diagnosis Date  . Anemia   . Arthritis   . 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 Chronic kidney disease    stage 3  . Coronary artery disease   . Depression   . Diabetes mellitus without complication (HCC)    Type 1  . Diabetic retinopathy ([place])   . 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 Family history of adverse reaction to anesthesia    mother: " while she was under she stopped breathing."  . Fibromyalgia   . Gastroparesis   . GERD (gastroesophageal reflux disease)   . Headache    migraines  . Hypothyroidism   . IBS (irritable bowel syndrome)   . Idiopathic edema   . Iron deficiency anemia 09/04/2019  . Lyme disease   . Mitral valve prolapse   . Myocardial infarction ([place])    1 major in 1999 and 2 minor " small vessel disease."  . Osteoporosis   . Peripheral neuropathy   . Peripheral vascular disease ([place])   . 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 Stroke [facility])    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" " the second stroke was a lacunar     Patient Active Problem List   Diagnosis Date Noted  . Herpes genitalis 11/07/2019  .  History of multiple cerebrovascular accidents (CVAs) 10/30/2019  . Iron deficiency anemia 09/04/2019  . DNR (do not resuscitate) 12/10/2018  . Dizziness 10/22/2018  . Ear fullness, bilateral 10/22/2018  . Tinnitus, right 10/22/2018  . 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 Arthritis 02/23/2018  . Asthma 02/23/2018  . Carpal tunnel syndrome 02/23/2018  . Gastroesophageal reflux disease 02/23/2018  . Kidney disease 02/23/2018  . Seasonal affective disorder ([place]) 02/23/2018  . Electrolyte abnormality 02/20/2017  . Sepsis secondary to UTI ([place]) 02/15/2017  . Sepsis with metabolic encephalopathy ([place]) 02/15/2017  . Hypertension associated with diabetes ([place]) 02/14/2017  . Encephalopathy acute 12/23/2016  . Brain atrophy ([place]) 05/24/2016  . Chronic headache 05/24/2016  . Postmenopausal osteoporosis 05/03/2016  . Neuropathy 04/20/2016  . Lacunar stroke ([place]) 01/20/2016  . Memory loss 01/20/2016  . Other fatigue 01/20/2016  . CAD (coronary artery disease) of artery bypass graft 01/20/2016  . Depression 01/20/2016  . Right lateral epicondylitis 12/16/2015  . 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 Lyme disease 12/16/2015  . Small intestinal bacterial overgrowth 11/19/2015  . Hyperlipidemia 09/25/2015  . Abdominal bloating 09/04/2015  . Closed displaced intra-articular fracture of right calcaneus 06/16/2015  . Myopia of both  eyes with astigmatism and presbyopia 05/21/2015  . Vitreous syneresis of both eyes 05/21/2015  . Combined form of age-related cataract, left eye 05/21/2015  . 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 Pseudophakia, right eye 05/21/2015  . Surgery, elective 05/06/2015  . CAD (coronary artery disease) 11/19/2014  . Diabetes mellitus type I ([place]) 11/19/2014  . DDD (degenerative disc disease), cervical 03/11/2014  . Cervical spondylosis with radiculopathy 03/11/2014  . Spondylolisthesis of cervical region 03/11/2014  . Vitreous hemorrhage of left  eye ([place]) 03/03/2014  . DDD (degenerative disc disease), thoracic 01/07/2014  . Anemia due to stage 3 chronic kidney disease ([place]) 12/16/2013  . Osteoporosis 11/14/2013  . Chronic diastolic heart failure ([place]) 09/18/2013  . Cataract due to secondary diabetes ([place]) 08/26/2013  . 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 associated with type 2 diabetes mellitus ([place]) 08/26/2013  . Secondary diabetes mellitus with ophthalmic complication ([place]) 78/58/8502  . Type 1 diabetes mellitus with hyperosmolar coma ([place]) 08/26/2013  . Hypothyroidism due to acquired atrophy of thyroid 08/02/2013  . Acquired hypothyroidism 08/02/2013  . Cerebral artery occlusion with cerebral infarction ([place]) 07/14/2013  . History of diabetic gastroparesis 04/09/2013  . Irritable bowel syndrome with diarrhea 04/09/2013  . Diarrhea 04/09/2013  . History of endocrine, metabolic or immunity disorder 04/09/2013  . Anemia of chronic disease 04/08/2013  . 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 ([place]) 77/41/2878  . CKD stage 3 due to type 1 diabetes mellitus ([place]) 04/08/2013  . Anemia 04/08/2013    Past Surgical History:  Procedure Laterality Date  . ABDOMINAL HYSTERECTOMY    . APPENDECTOMY    . BREAST SURGERY     B/L biopsy and lumpectomy   . CARDIAC CATHETERIZATION    . CARPAL TUNNEL RELEASE    . CATARACT EXTRACTION W/ INTRAOCULAR LENS IMPLANT     right eye  . COLONOSCOPY W/ BIOPSIES AND POLYPECTOMY    . CORONARY ARTERY BYPASS GRAFT    . coronary artery stents     at [place] and [place]  . ECTOPIC PREGNANCY SURGERY    . NASAL SEPTUM SURGERY    . OPEN REDUCTION INTERNAL FIXATION (ORIF) DISTAL RADIAL FRACTURE Right 05/06/2015   Procedure: OPEN REDUCTION INTERNAL FIXATION (ORIF) RIGHT DISTAL RADIAL FRACTURE AND REPAIRS AS NEEDED;  Surgeon: Iran Planas, MD;  Location: [place];  Service: Orthopedics;  Laterality: Right;  . ORIF WRIST FRACTURE Left 11/05/2014  . ORIF WRIST FRACTURE Left  11/05/2014   Procedure: OPEN REDUCTION INTERNAL FIXATION (ORIF) LEFT WRIST FRACTURE AND REPAIR AS INDICATED;  Surgeon: Iran Planas, MD;  Location: [place];  Service: Orthopedics;  Laterality: Left;  . TRIGGER FINGER RELEASE      OB History   No obstetric history on file.      Home Medications    Prior to Admission medications   Medication Sig Start Date End Date Taking? Authorizing Provider  AMBULATORY NON FORMULARY MEDICATION Incontinence pads per patient preference, #unlimited, refill [phone] Fax to [phone] 12/25/18   Emeterio Reeve, DO  arginine 500 MG tablet Take 500 mg by mouth as directed.     [provider]  Armodafinil 250 MG tablet Take 1 tablet (250 mg total) by mouth daily. OR can take 0.5 tablet (125 mg) po bid 10/30/19   Emeterio Reeve, DO  aspirin EC 325 MG tablet Take 1 tablet (325 mg total) by mouth daily. 03/06/19   Lelon Perla, MD  Biotin 1000 MCG tablet Take 1,000 mcg by mouth  2 (two) times a week.    [provider]  Capsaicin-Menthol-Methyl Sal (CAPSAICIN-METHYL SAL-MENTHOL) 0.025-1-12 % CREA Apply 1 application topically 3 (three) times daily as needed. 11/15/19   Noe Gens, PA-C  carvedilol (COREG) 12.5 MG tablet Take 1 tablet (12.5 mg total) by mouth 2 (two) times daily with a meal. 06/12/19   Crenshaw, Denice Bors, MD  Cholecalciferol (VITAMIN D3) 5000 UNITS TABS Take 1,000 Units by mouth daily.     [provider]  ciclopirox (PENLAC) 8 % solution Apply topically at bedtime. Apply over nail and surrounding skin. Apply daily over previous coat. After seven (7) days, may remove with alcohol and continue cycle. 10/05/18   Emeterio Reeve, DO  Continuous Blood Gluc Sensor (DEXCOM G6 SENSOR) MISC USE TO CONTINUOUSLY MONITOR BLOOD SUGARS. CHANGE EVERY 10 DAYS 04/02/19   [provider]  cyclobenzaprine (FLEXERIL) 10 MG tablet Take 0.5-1 tablets (5-10 mg total) by mouth 3 (three) times daily as needed for muscle spasms.  Caution: can cause drowsiness 11/13/19   Emeterio Reeve, DO  diclofenac sodium (VOLTAREN) 1 % GEL Apply 2 g topically 4 (four) times daily. To affected joint. 10/25/18   Gregor Hams, MD  diphenhydrAMINE HCl (BENADRYL ALLERGY PO) Take by mouth.    [provider]  diphenoxylate-atropine (LOMOTIL) 2.5-0.025 MG tablet Take 1 tablet by mouth 4 (four) times daily as needed for diarrhea or loose stools. 10/25/19   [name], MD  ELDERBERRY PO Take by mouth.    [provider]  Esomeprazole Magnesium (NEXIUM PO) Take 20 mg by mouth every morning.     [provider]  fluocinonide (LIDEX) 0.05 % external solution APPLY SOLUTION TOPICALLY ONCE DAILY AS NEEDED TO SCALP 01/20/18   [provider]  folic acid (FOLVITE) 1 MG tablet Take by mouth.    [provider]  gabapentin (NEURONTIN) 100 MG capsule Take 1-2 capsules (100-200 mg total) by mouth 3 (three) times daily as needed. 10/04/19   Samuel Bouche, NP  insulin aspart (NOVOLOG) 100 UNIT/ML injection USE VIA INSULIN PUMP TOTAL DAILY INSULIN 40 UNITS 04/16/19   [provider]  Insulin Human (INSULIN PUMP) SOLN Inject into the skin continuous. Novolog insulin    [provider]  ketoconazole (NIZORAL) 2 % shampoo APPLY 1 APPLICATION TOPICALLY 2 (TWO) TIMES A WEEK. 10/17/19   Emeterio Reeve, DO  KRILL OIL PO Take by mouth.    [provider]  lidocaine-prilocaine (EMLA) cream Apply 1 application topically every 2 (two) hours as needed. Please run with good Rx discount coupon 11/08/19   Emeterio Reeve, DO  Linoleic Acid Conjugated 1000 MG CAPS Take 1,000 mg by mouth daily.     [provider]  loperamide (IMODIUM A-D) 2 MG tablet Take 2 mg by mouth as needed.    [provider]  Melatonin 5 MG CHEW Chew 1 tablet by mouth at bedtime.    [provider]  nitroGLYCERIN (NITROLINGUAL) 0.4 MG/SPRAY spray Place 1 spray under the tongue every 5 (five)  minutes x 3 doses as needed for chest pain. 03/06/19   Lelon Perla, MD  NON FORMULARY Chloroella    [provider]  OVER THE COUNTER MEDICATION Take 750 mg by mouth at bedtime. GABA    [provider]  OVER THE COUNTER MEDICATION as directed. Magnesium L-Treonate    [provider]  Pancrelipase, Lip-Prot-Amyl, (CREON) 3000-9500 units CPEP Take by mouth. 05/20/19   [provider]  spironolactone (ALDACTONE)  25 MG tablet Take by mouth. 05/09/19   [provider]  thyroid (ARMOUR THYROID) 60 MG tablet Take 1 tablet (60 mg total) by mouth daily before breakfast. 04/02/19   Emeterio Reeve, DO  torsemide ([name]) 20 MG tablet TAKE 1 TABLET ONCE DAILY INTHE MORNING. MAY TAKE AN   EXTRA 1/2 TABLET AS        NEEDED Patient taking differently: every other day. TAKE 1 TABLET ONCE DAILY INTHE MORNING. MAY TAKE AN   EXTRA 1/2 TABLET AS NEEDED 01/15/18   Trixie Dredge, PA-C  XIFAXAN 550 MG TABS tablet Take 550 mg by mouth 2 (two) times daily. 10/14/19   [provider]    Family History Family History  Problem Relation Age of Onset  . Breast cancer Mother   . Heart disease Father   . Hypertension Father   . Breast cancer Sister     Social History Social History   Tobacco Use  . Smoking status: Former Smoker    Types: Cigarettes  . Smokeless tobacco: Never Used  . Tobacco comment:  " Quit smoking cigarettes in 20's "  Vaping Use  . Vaping Use: Never used  Substance Use Topics  . Alcohol use: Yes    Comment: occasional beer or wine  . Drug use: No     Allergies   Morphine, Morphine and related, Penicillins, Tramadol, Tramadol-acetaminophen, Acetaminophen, Amitriptyline, Codeine, Ibuprofen, Losartan, Propoxyphene, Shellfish allergy, Statins, Sulfamethoxazole, and Trichophyton   Review of Systems Review of Systems  Musculoskeletal: Positive for back pain and myalgias. Negative for neck pain and neck stiffness.    Neurological: Negative for weakness and numbness.     Physical Exam Triage Vital Signs ED Triage Vitals  Enc Vitals Group     BP 11/15/19 1321 (!) 177/80     Pulse Rate 11/15/19 1321 64     Resp 11/15/19 1321 20     Temp 11/15/19 1321 98 F (36.7 C)     Temp Source 11/15/19 1321 Oral     SpO2 11/15/19 1321 100 %     Weight 11/15/19 1318 94 lb (42.6 kg)     Height 11/15/19 1318 4\' 11"  (1.499 m)     Head Circumference --      Peak Flow --      Pain Score 11/15/19 1318 0     Pain Loc --      Pain Edu? --      Excl. in [place]? --    No data found.  Updated Vital Signs BP (!) 177/80 (BP Location: Right Arm)   Pulse 64   Temp 98 F (36.7 C) (Oral)   Resp 20   Ht 4\' 11"  (1.499 m)   Wt 94 lb (42.6 kg)   SpO2 100%   BMI 18.99 kg/m   Visual Acuity Right Eye Distance:   Left Eye Distance:   Bilateral Distance:    Right Eye Near:   Left Eye Near:    Bilateral Near:     Physical Exam Vitals and nursing note reviewed.  Constitutional:      Appearance: Normal appearance. She is well-developed.  HENT:     Head: Normocephalic and atraumatic.  Cardiovascular:     Rate and Rhythm: Normal rate and regular rhythm.  Pulmonary:     Effort: Pulmonary effort is normal.  Abdominal:     Palpations: Abdomen is soft.     Tenderness: There is no abdominal tenderness.  Musculoskeletal:  General: Tenderness present. Normal range of motion.     Cervical back: Normal range of motion.     Comments: No spinal tenderness. Mild tenderness to Right lower back. No hip joint tenderness.  Increased pain with flexion at the waist.  Normal gait.   Skin:    General: Skin is warm and dry.     Findings: No bruising, erythema or rash.  Neurological:     Mental Status: She is alert and oriented to person, place, and time.  Psychiatric:        Behavior: Behavior normal.      UC Treatments / Results  Labs (all labs ordered are listed, but only abnormal results are displayed) Labs  Reviewed  POCT URINALYSIS DIP (MANUAL ENTRY) - Abnormal; Notable for the following components:      Result Value   Color, UA other (*)    Glucose, UA =100 (*)    Spec Grav, UA >=1.030 (*)    Protein Ur, POC >=300 (*)    All other components within normal limits    EKG   Radiology DG Lumbar Spine Complete  Result Date: 11/15/2019 CLINICAL DATA:  Right-sided low back pain. EXAM: LUMBAR SPINE - COMPLETE 4+ VIEW COMPARISON:  CT abdomen pelvis May 23, 2019 FINDINGS: There is no evidence of lumbar spine fracture. Trace retrolisthesis of L4 on L5, similar to prior CT abdomen and pelvis. Intervertebral disc spaces are maintained. Atherosclerotic vascular calcifications. Calcified phleboliths in the anatomic pelvis. IMPRESSION: 1. No evidence of acute fracture. 2. Similar trace retrolisthesis of L4 on L5. 3. Intervertebral disc spaces are maintained. Electronically Signed   By: Margaretha Sheffield MD   On: 11/15/2019 14:51    Procedures Procedures (including critical care time)  Medications Ordered in UC Medications - No data to display  Initial Impression / Assessment and Plan / UC Course  I have reviewed the triage vital signs and the nursing notes.  Pertinent labs & imaging results that were available during my care of the patient were reviewed by me and considered in my medical decision making (see chart for details).     Pt requesting imaging and UA for back pain. No red flag symptoms Discussed x-ray and UA with pt Encouraged close f/u with PCP or Sports Medicine next week if needed  Final Clinical Impressions(s) / UC Diagnoses   Final diagnoses:  Right flank pain  Strain of lumbar region, initial encounter     Discharge Instructions      Please call to schedule a follow up appointment next week with primary care or sports medicine for further evaluation and treatment of back pain if not improving over the weekend.     ED Prescriptions    Medication Sig Dispense  Auth. Provider   Capsaicin-Menthol-Methyl Sal (CAPSAICIN-METHYL SAL-MENTHOL) 0.025-1-12 % CREA Apply 1 application topically 3 (three) times daily as needed. 56.6 g Noe Gens, [place]     I have reviewed the PDMP during this encounter.   Noe Gens, [place] 11/15/19 1546

## 2019-11-22 ENCOUNTER — Other Ambulatory Visit: Payer: Self-pay

## 2019-11-22 ENCOUNTER — Inpatient Hospital Stay: Payer: 59

## 2019-11-22 ENCOUNTER — Inpatient Hospital Stay (HOSPITAL_BASED_OUTPATIENT_CLINIC_OR_DEPARTMENT_OTHER): Payer: 59 | Admitting: Hematology & Oncology

## 2019-11-22 ENCOUNTER — Encounter: Payer: Self-pay | Admitting: Hematology & Oncology

## 2019-11-22 VITALS — BP 159/75 | HR 60 | Temp 97.8°F | Resp 16 | Wt 99.0 lb

## 2019-11-22 DIAGNOSIS — N189 Chronic kidney disease, unspecified: Secondary | ICD-10-CM | POA: Diagnosis not present

## 2019-11-22 DIAGNOSIS — D631 Anemia in chronic kidney disease: Secondary | ICD-10-CM

## 2019-11-22 DIAGNOSIS — N183 Chronic kidney disease, stage 3 unspecified: Secondary | ICD-10-CM

## 2019-11-22 DIAGNOSIS — I2510 Atherosclerotic heart disease of native coronary artery without angina pectoris: Secondary | ICD-10-CM

## 2019-11-22 DIAGNOSIS — D500 Iron deficiency anemia secondary to blood loss (chronic): Secondary | ICD-10-CM | POA: Diagnosis not present

## 2019-11-22 DIAGNOSIS — E1022 Type 1 diabetes mellitus with diabetic chronic kidney disease: Secondary | ICD-10-CM

## 2019-11-22 DIAGNOSIS — N1831 Chronic kidney disease, stage 3a: Secondary | ICD-10-CM

## 2019-11-22 DIAGNOSIS — D638 Anemia in other chronic diseases classified elsewhere: Secondary | ICD-10-CM

## 2019-11-22 LAB — CMP (CANCER CENTER ONLY)
ALT: 11 U/L (ref 0–44)
AST: 17 U/L (ref 15–41)
Albumin: 3.9 g/dL (ref 3.5–5.0)
Alkaline Phosphatase: 73 U/L (ref 38–126)
Anion gap: 6 (ref 5–15)
BUN: 23 mg/dL (ref 8–23)
CO2: 28 mmol/L (ref 22–32)
Calcium: 9.8 mg/dL (ref 8.9–10.3)
Chloride: 95 mmol/L — ABNORMAL LOW (ref 98–111)
Creatinine: 1.2 mg/dL — ABNORMAL HIGH (ref 0.44–1.00)
GFR, Estimated: 50 mL/min — ABNORMAL LOW (ref >60)
Glucose, Bld: 119 mg/dL — ABNORMAL HIGH (ref 70–99)
Potassium: 4.2 mmol/L (ref 3.5–5.1)
Sodium: 129 mmol/L — ABNORMAL LOW (ref 135–145)
Total Bilirubin: 0.3 mg/dL (ref 0.3–1.2)
Total Protein: 6.7 g/dL (ref 6.5–8.1)

## 2019-11-22 LAB — CBC WITH DIFFERENTIAL (CANCER CENTER ONLY)
Abs Immature Granulocytes: 0.01 10*3/uL (ref 0.00–0.07)
Basophils Absolute: 0.1 10*3/uL (ref 0.0–0.1)
Basophils Relative: 1 %
Eosinophils Absolute: 0.2 10*3/uL (ref 0.0–0.5)
Eosinophils Relative: 3 %
HCT: 30.2 % — ABNORMAL LOW (ref 36.0–46.0)
Hemoglobin: 9.7 g/dL — ABNORMAL LOW (ref 12.0–15.0)
Immature Granulocytes: 0 %
Lymphocytes Relative: 28 %
Lymphs Abs: 1.6 10*3/uL (ref 0.7–4.0)
MCH: 27.5 pg (ref 26.0–34.0)
MCHC: 32.1 g/dL (ref 30.0–36.0)
MCV: 85.6 fL (ref 80.0–100.0)
Monocytes Absolute: 0.7 10*3/uL (ref 0.1–1.0)
Monocytes Relative: 12 %
Neutro Abs: 3.1 10*3/uL (ref 1.7–7.7)
Neutrophils Relative %: 56 %
Platelet Count: 432 10*3/uL — ABNORMAL HIGH (ref 150–400)
RBC: 3.53 MIL/uL — ABNORMAL LOW (ref 3.87–5.11)
RDW: 16.2 % — ABNORMAL HIGH (ref 11.5–15.5)
WBC Count: 5.5 10*3/uL (ref 4.0–10.5)
nRBC: 0 % (ref 0.0–0.2)

## 2019-11-22 LAB — RETICULOCYTES
Immature Retic Fract: 3.9 % (ref 2.3–15.9)
RBC.: 3.55 MIL/uL — ABNORMAL LOW (ref 3.87–5.11)
Retic Count, Absolute: 32.7 10*3/uL (ref 19.0–186.0)
Retic Ct Pct: 0.9 % (ref 0.4–3.1)

## 2019-11-22 MED ORDER — DARBEPOETIN ALFA 200 MCG/0.4ML IJ SOSY
PREFILLED_SYRINGE | INTRAMUSCULAR | Status: AC
  Filled 2019-11-22: qty 0.4

## 2019-11-22 MED ORDER — DARBEPOETIN ALFA 200 MCG/0.4ML IJ SOSY
200.0000 ug | PREFILLED_SYRINGE | Freq: Once | INTRAMUSCULAR | Status: AC
  Administered 2019-11-22: 200 ug via SUBCUTANEOUS

## 2019-11-22 NOTE — Patient Instructions (Signed)
Darbepoetin Alfa injection What is this medicine? DARBEPOETIN ALFA (dar be POE e tin AL fa) helps your body make more red blood cells. It is used to treat anemia caused by chronic kidney failure and chemotherapy. This medicine may be used for other purposes; ask your health care provider or pharmacist if you have questions. COMMON BRAND NAME(S): Aranesp What should I tell my health care provider before I take this medicine? They need to know if you have any of these conditions:  blood clotting disorders or history of blood clots  cancer patient not on chemotherapy  cystic fibrosis  heart disease, such as angina, heart failure, or a history of a heart attack  hemoglobin level of 12 g/dL or greater  high blood pressure  low levels of folate, iron, or vitamin B12  seizures  an unusual or allergic reaction to darbepoetin, erythropoietin, albumin, hamster proteins, latex, other medicines, foods, dyes, or preservatives  pregnant or trying to get pregnant  breast-feeding How should I use this medicine? This medicine is for injection into a vein or under the skin. It is usually given by a health care professional in a hospital or clinic setting. If you get this medicine at home, you will be taught how to prepare and give this medicine. Use exactly as directed. Take your medicine at regular intervals. Do not take your medicine more often than directed. It is important that you put your used needles and syringes in a special sharps container. Do not put them in a trash can. If you do not have a sharps container, call your pharmacist or healthcare provider to get one. A special MedGuide will be given to you by the pharmacist with each prescription and refill. Be sure to read this information carefully each time. Talk to your pediatrician regarding the use of this medicine in children. While this medicine may be used in children as young as 1 month of age for selected conditions, precautions do  apply. Overdosage: If you think you have taken too much of this medicine contact a poison control center or emergency room at once. NOTE: This medicine is only for you. Do not share this medicine with others. What if I miss a dose? If you miss a dose, take it as soon as you can. If it is almost time for your next dose, take only that dose. Do not take double or extra doses. What may interact with this medicine? Do not take this medicine with any of the following medications:  epoetin alfa This list may not describe all possible interactions. Give your health care provider a list of all the medicines, herbs, non-prescription drugs, or dietary supplements you use. Also tell them if you smoke, drink alcohol, or use illegal drugs. Some items may interact with your medicine. What should I watch for while using this medicine? Your condition will be monitored carefully while you are receiving this medicine. You may need blood work done while you are taking this medicine. This medicine may cause a decrease in vitamin B6. You should make sure that you get enough vitamin B6 while you are taking this medicine. Discuss the foods you eat and the vitamins you take with your health care professional. What side effects may I notice from receiving this medicine? Side effects that you should report to your doctor or health care professional as soon as possible:  allergic reactions like skin rash, itching or hives, swelling of the face, lips, or tongue  breathing problems  changes in   vision  chest pain  confusion, trouble speaking or understanding  feeling faint or lightheaded, falls  high blood pressure  muscle aches or pains  pain, swelling, warmth in the leg  rapid weight gain  severe headaches  sudden numbness or weakness of the face, arm or leg  trouble walking, dizziness, loss of balance or coordination  seizures (convulsions)  swelling of the ankles, feet, hands  unusually weak or  tired Side effects that usually do not require medical attention (report to your doctor or health care professional if they continue or are bothersome):  diarrhea  fever, chills (flu-like symptoms)  headaches  nausea, vomiting  redness, stinging, or swelling at site where injected This list may not describe all possible side effects. Call your doctor for medical advice about side effects. You may report side effects to FDA at 1-800-FDA-1088. Where should I keep my medicine? Keep out of the reach of children. Store in a refrigerator between 2 and 8 degrees C (36 and 46 degrees F). Do not freeze. Do not shake. Throw away any unused portion if using a single-dose vial. Throw away any unused medicine after the expiration date. NOTE: This sheet is a summary. It may not cover all possible information. If you have questions about this medicine, talk to your doctor, pharmacist, or health care provider.  2020 [employer]vier/Gold Standard (2017-01-25 16:44:20)  

## 2019-11-22 NOTE — Progress Notes (Signed)
Hematology and Oncology Follow Up Visit  Brittney Pace [id number] 05/14/1955 64 y.o. 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  We are still having some difficulty getting her blood count back up.  I am a little bit troubled by this.  I am not sure exactly as to what might be the problem.  I am not sure when she had her last colonoscopy or upper endoscopy.  Regard to have to look into this.  We last saw her about a month ago, her ferritin was 87 with iron saturation of 45%.  She has diabetes.  She has an insulin pump on.  She does have a very low erythropoietin level.  I think the erythropoietin level is only 5.  She has had no issues with obvious bleeding.  Is been no melena or bright red blood per rectum.  She has had no fever.  She has had no cough or shortness of breath.  Her appetite has been pretty good.  Of note, she comes in with a skeleton costume on.  Her husband also has 1 as well as her dog.     Overall, I would have to say her performance status is ECOG 1-2.     Medications:  Allergies as of 11/22/2019      Reactions   Morphine Anaphylaxis, Shortness Of Breath   Morphine And Related Shortness Of Breath   Penicillins Hives   Has patient had a PCN reaction causing immediate rash, facial/tongue/throat swelling, SOB or lightheadedness with hypotension: Yes Has patient had a PCN reaction causing severe rash involving mucus membranes or skin necrosis: No Has patient had a PCN reaction that required hospitalization No Has patient had a PCN reaction occurring within the last 10 years: No If all of the above answers are "NO", then may proceed with Cephalosporin use.   Tramadol Anaphylaxis   Tramadol-acetaminophen Other (See Comments)    Small vessel heart attack Other reaction(s): Cardiovascular Arrest (ALLERGY/intolerance), Other (See Comments)   Acetaminophen Other (See Comments)   Alters insulin pump readings   Amitriptyline Other (See Comments)   Severe headache/ out of body feeling   Codeine Other (See Comments)   Severe headaches/ out of body feeling   Ibuprofen Other (See Comments)   Messes up CGM reading on glucose monitor   Losartan Cough   Propoxyphene Other (See Comments)   Severe headaches / out of body feeling   Shellfish Allergy Diarrhea, Nausea And Vomiting   Statins Other (See Comments)   Muscle pains Other reaction(s): Other   Sulfamethoxazole Other (See Comments)   Mouth ulcers   Trichophyton Itching, Other (See Comments)   (fungus) sinusitis      Medication List       Accurate as of November 22, 2019  3:46 PM. If you have any questions, ask your nurse or doctor.        STOP taking these medications   loperamide 2 MG tablet Commonly known as: IMODIUM A-D Stopped by: [name], MD   Xifaxan 550 MG Tabs tablet Generic drug: rifaximin Stopped by: [name], MD     TAKE these medications   AMBULATORY NON FORMULARY MEDICATION Incontinence pads per patient preference, #unlimited, refill x99 Fax to [phone]   arginine 500 MG tablet Take 500 mg by mouth  as directed.   Armodafinil 250 MG tablet Take 1 tablet (250 mg total) by mouth daily. OR can take 0.5 tablet (125 mg) po bid   aspirin EC 325 MG tablet Take 1 tablet (325 mg total) by mouth daily.   BENADRYL ALLERGY PO Take by mouth.   Biotin 1000 MCG tablet Take 1,000 mcg by mouth 2 (two) times a week.   capsaicin-methyl sal-menthol 0.025-1-12 % Crea Generic drug: Capsaicin-Menthol-Methyl Sal Apply 1 application topically 3 (three) times daily as needed.   carvedilol 12.5 MG tablet Commonly known as: COREG Take 1 tablet (12.5 mg total) by mouth 2 (two) times daily with a meal.   ciclopirox 8 %  solution Commonly known as: PENLAC Apply topically at bedtime. Apply over nail and surrounding skin. Apply daily over previous coat. After seven (7) days, may remove with alcohol and continue cycle.   Creon 3000-9500 units Cpep Generic drug: Pancrelipase (Lip-Prot-Amyl) Take by mouth.   cyclobenzaprine 10 MG tablet Commonly known as: FLEXERIL Take 0.5-1 tablets (5-10 mg total) by mouth 3 (three) times daily as needed for muscle spasms. Caution: can cause drowsiness   Dexcom G6 Sensor Misc USE TO CONTINUOUSLY MONITOR BLOOD SUGARS. CHANGE EVERY 10 DAYS   diclofenac sodium 1 % Gel Commonly known as: VOLTAREN Apply 2 g topically 4 (four) times daily. To affected joint.   diphenoxylate-atropine 2.5-0.025 MG tablet Commonly known as: LOMOTIL Take 1 tablet by mouth 4 (four) times daily as needed for diarrhea or loose stools.   ELDERBERRY PO Take by mouth.   fluocinonide 0.05 % external solution Commonly known as: LIDEX APPLY SOLUTION TOPICALLY ONCE DAILY AS NEEDED TO SCALP   folic acid 1 MG tablet Commonly known as: FOLVITE Take by mouth.   gabapentin 100 MG capsule Commonly known as: NEURONTIN Take 1-2 capsules (100-200 mg total) by mouth 3 (three) times daily as needed.   insulin aspart 100 UNIT/ML injection Commonly known as: novoLOG USE VIA INSULIN PUMP TOTAL DAILY INSULIN 40 UNITS   insulin pump Soln Inject into the skin continuous. Novolog insulin   ketoconazole 2 % shampoo Commonly known as: NIZORAL APPLY 1 APPLICATION TOPICALLY 2 (TWO) TIMES A WEEK.   KRILL OIL PO Take by mouth.   lidocaine-prilocaine cream Commonly known as: EMLA Apply 1 application topically every 2 (two) hours as needed. Please run with good Rx discount coupon   Linoleic Acid Conjugated 1000 MG Caps Take 1,000 mg by mouth daily.   Melatonin 5 MG Chew Chew 1 tablet by mouth at bedtime.   NEXIUM PO Take 20 mg by mouth every morning.   nitroGLYCERIN 0.4 MG/SPRAY spray Commonly known  as: NITROLINGUAL Place 1 spray under the tongue every 5 (five) minutes x 3 doses as needed for chest pain.   NON FORMULARY Chloroella   OVER THE COUNTER MEDICATION Take 750 mg by mouth at bedtime. GABA   OVER THE COUNTER MEDICATION as directed. Magnesium L-Treonate   spironolactone 25 MG tablet Commonly known as: ALDACTONE Take by mouth.   thyroid 60 MG tablet Commonly known as: Armour Thyroid Take 1 tablet (60 mg total) by mouth daily before breakfast.   torsemide 20 MG tablet Commonly known as: [name] TAKE 1 TABLET ONCE DAILY INTHE MORNING. MAY TAKE AN   EXTRA 1/2 TABLET AS        NEEDED What changed: See the new instructions.   Vitamin D3 125 MCG (5000 UT) Tabs Take 1,000 Units by mouth daily.       Allergies:  Allergies  Allergen Reactions  . Morphine Anaphylaxis and Shortness Of Breath  . Morphine And Related Shortness Of Breath  . Penicillins Hives    Has patient had a PCN reaction causing immediate rash, facial/tongue/throat swelling, SOB or lightheadedness with hypotension: Yes Has patient had a PCN reaction causing severe rash involving mucus membranes or skin necrosis: No Has patient had a PCN reaction that required hospitalization No Has patient had a PCN reaction occurring within the last 10 years: No If all of the above answers are "NO", then may proceed with Cephalosporin use.  . Tramadol Anaphylaxis  . Tramadol-Acetaminophen Other (See Comments)    Small vessel heart attack Other reaction(s): Cardiovascular Arrest (ALLERGY/intolerance), Other (See Comments)   . Acetaminophen Other (See Comments)    Alters insulin pump readings   . Amitriptyline Other (See Comments)    Severe headache/ out of body feeling  . Codeine Other (See Comments)    Severe headaches/ out of body feeling   . Ibuprofen Other (See Comments)    Messes up CGM reading on glucose monitor    . Losartan Cough  . Propoxyphene Other (See Comments)    Severe headaches / out of  body feeling   . Shellfish Allergy Diarrhea and Nausea And Vomiting  . Statins Other (See Comments)    Muscle pains Other reaction(s): Other  . Sulfamethoxazole Other (See Comments)    Mouth ulcers   . Trichophyton Itching and Other (See Comments)    (fungus) sinusitis     Past Medical History, Surgical history, Social history, and Family History were reviewed and updated.  Review of Systems: Review of Systems  Constitutional: Positive for malaise/fatigue.  HENT: Positive for ear discharge.   Eyes: Positive for blurred vision.  Respiratory: Negative.   Cardiovascular: Positive for claudication.  Gastrointestinal: Positive for nausea.  Genitourinary: Positive for dysuria and urgency.  Musculoskeletal: Positive for joint pain, myalgias and neck pain.  Skin: Negative.   Neurological: Positive for dizziness, focal weakness and headaches.  Endo/Heme/Allergies: Negative.   Psychiatric/Behavioral: Negative.      Physical Exam:  weight is 99 lb (44.9 kg). Her oral temperature is 97.8 F (36.6 C). Her blood pressure is 159/75 (abnormal) and her pulse is 60. Her respiration is 16 and oxygen saturation is 99%.   Wt Readings from Last 3 Encounters:  11/22/19 99 lb (44.9 kg)  11/15/19 94 lb (42.6 kg)  11/07/19 94 lb (42.6 kg)    Physical Exam Vitals reviewed.  HENT:     Head: Normocephalic and atraumatic.  Eyes:     Pupils: Pupils are equal, round, and reactive to light.  Cardiovascular:     Rate and Rhythm: Normal rate and regular rhythm.     Heart sounds: Normal heart sounds.  Pulmonary:     Effort: Pulmonary effort is normal.     Breath sounds: Normal breath sounds.  Abdominal:     General: Bowel sounds are normal.     Palpations: Abdomen is soft.  Musculoskeletal:        General: No tenderness or deformity. Normal range of motion.     Cervical back: Normal range of motion.  Lymphadenopathy:     Cervical: No cervical adenopathy.  Skin:    General: Skin is warm  and dry.     Findings: No erythema or rash.  Neurological:     Mental Status: She is alert and oriented to person, place, and time.  Psychiatric:        Behavior: Behavior  normal.        Thought Content: Thought content normal.        Judgment: Judgment normal.      Lab Results  Component Value Date   WBC 5.5 11/22/2019   HGB 9.7 (L) 11/22/2019   HCT 30.2 (L) 11/22/2019   MCV 85.6 11/22/2019   PLT 432 (H) 11/22/2019   Lab Results  Component Value Date   FERRITIN 87 10/25/2019   IRON 133 10/25/2019   TIBC 295 10/25/2019   UIBC 162 10/25/2019   IRONPCTSAT 45 10/25/2019   Lab Results  Component Value Date   RETICCTPCT 0.9 11/22/2019   RBC 3.53 (L) 11/22/2019   RBC 3.55 (L) 11/22/2019   No results found for: KPAFRELGTCHN, LAMBDASER, KAPLAMBRATIO No results found for: IGGSERUM, IGA, IGMSERUM No results found for: Kathrynn Ducking, MSPIKE, SPEI   Chemistry      Component Value Date/Time   NA 129 (L) 11/22/2019 1440   NA 138 07/17/2017 1128   K 4.2 11/22/2019 1440   CL 95 (L) 11/22/2019 1440   CO2 28 11/22/2019 1440   BUN 23 11/22/2019 1440   BUN 23 07/17/2017 1128   CREATININE 1.20 (H) 11/22/2019 1440   CREATININE 0.99 07/20/2018 0821      Component Value Date/Time   CALCIUM 9.8 11/22/2019 1440   ALKPHOS 73 11/22/2019 1440   AST 17 11/22/2019 1440   ALT 11 11/22/2019 1440   BILITOT 0.3 11/22/2019 1440       Impression and Plan: Ms. Sciara is a very pleasant 64 yo female with multifactorial anemia.  We will have to see what her iron levels are.   Her reticulocyte count is incredibly low.  Corrected, the reticulocyte count is probably 0.6.  If we cannot get her blood better, we may have to consider a bone marrow biopsy on her.  When I look at her blood under the microscope I really do not see anything that looks suspicious.  We we will increase the dose of Aranesp when we see her back.  I will give her the 200  mcg dose today.  When we see her back we will give her 300 mcg dose.  Again we may have to consider endoscopic evaluation for her.  I would like to see her back in about 3 weeks.  I really want to get her blood count better for the holiday season.   [name], MD 10/29/20213:46 PM

## 2019-11-22 NOTE — Progress Notes (Signed)
Pt discharged in no apparent distress. Pt left ambulatory without assistance. Pt aware of discharge instructions and verbalized understanding and had no further questions.  

## 2019-11-25 LAB — FERRITIN: Ferritin: 86 ng/mL (ref 11–307)

## 2019-11-25 LAB — IRON AND TIBC
Iron: 98 ug/dL (ref 41–142)
Saturation Ratios: 32 % (ref 21–57)
TIBC: 306 ug/dL (ref 236–444)
UIBC: 208 ug/dL (ref 120–384)

## 2019-11-26 ENCOUNTER — Telehealth: Payer: Self-pay | Admitting: Hematology & Oncology

## 2019-11-26 NOTE — Telephone Encounter (Signed)
Appointments scheduled calendar printed & mailed per 10/29 los 

## 2019-12-02 ENCOUNTER — Encounter: Payer: Self-pay | Admitting: Osteopathic Medicine

## 2019-12-03 ENCOUNTER — Encounter: Payer: Self-pay | Admitting: Osteopathic Medicine

## 2019-12-03 ENCOUNTER — Other Ambulatory Visit: Payer: Self-pay

## 2019-12-03 ENCOUNTER — Ambulatory Visit (INDEPENDENT_AMBULATORY_CARE_PROVIDER_SITE_OTHER): Payer: 59 | Admitting: Osteopathic Medicine

## 2019-12-03 VITALS — BP 166/71 | HR 69 | Temp 98.2°F | Wt 93.1 lb

## 2019-12-03 DIAGNOSIS — I2510 Atherosclerotic heart disease of native coronary artery without angina pectoris: Secondary | ICD-10-CM

## 2019-12-03 DIAGNOSIS — R32 Unspecified urinary incontinence: Secondary | ICD-10-CM

## 2019-12-03 LAB — HM DIABETES EYE EXAM

## 2019-12-03 MED ORDER — CIPROFLOXACIN HCL 500 MG PO TABS: 500.0000 mg | ORAL_TABLET | Freq: Two times a day (BID) | ORAL | 0 refills | Status: DC

## 2019-12-03 NOTE — Progress Notes (Signed)
HPI: Brittney Pace is a 63 y.o. 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  she presents to [facility] today, 12/03/19,  for chief complaint of:  Concerned for UTI   Incontinence chronically but worse about a moth ago.  PT attributes this to being over sedated w/ tympanostomy tube placement about a month ago. Reports urgency and overfly-type incontinence. Not taking incontinence Rx. No leakage with valsalva.  No frequency, hematuria, fever, chills.  Minimal dysuria, subtle occasional burning sensation Difficulty with initiating urination.    Hx- "urethra dilated" Hx - "damage from foley placement"     Past medical, surgical, social and family history reviewed:  Patient Active Problem List   Diagnosis Date Noted  . Herpes genitalis 11/07/2019  . History of multiple cerebrovascular accidents (CVAs) 10/30/2019  . Iron deficiency anemia 09/04/2019  . DNR (do not resuscitate) 12/10/2018  . Dizziness 10/22/2018  . Ear fullness, bilateral 10/22/2018  . Tinnitus, right 10/22/2018  . 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 Arthritis 02/23/2018  . Asthma 02/23/2018  . Carpal tunnel syndrome 02/23/2018  . Gastroesophageal reflux disease 02/23/2018  . Kidney disease 02/23/2018  . Seasonal affective disorder ([place]) 02/23/2018  . Electrolyte  abnormality 02/20/2017  . Sepsis secondary to UTI ([place]) 02/15/2017  . Sepsis with metabolic encephalopathy ([place]) 02/15/2017  . Hypertension associated with diabetes ([place]) 02/14/2017  . Encephalopathy acute 12/23/2016  . Brain atrophy ([place]) 05/24/2016  . Chronic headache 05/24/2016  . Postmenopausal osteoporosis 05/03/2016  . Neuropathy 04/20/2016  . Lacunar stroke ([place]) 01/20/2016  . Memory loss 01/20/2016  . Other fatigue 01/20/2016  . CAD (coronary artery disease) of artery bypass graft 01/20/2016  . Depression 01/20/2016  . Right lateral epicondylitis 12/16/2015  . 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 Lyme disease 12/16/2015  . Small intestinal bacterial overgrowth 11/19/2015  . Hyperlipidemia 09/25/2015  . Abdominal bloating 09/04/2015  . Closed displaced intra-articular fracture of right calcaneus 06/16/2015  . Myopia of both eyes with astigmatism and presbyopia 05/21/2015  . Vitreous syneresis of both eyes 05/21/2015  . Combined form of age-related cataract, left eye 05/21/2015  . 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 Pseudophakia, right eye 05/21/2015  . Surgery, elective 05/06/2015  . CAD (coronary artery disease) 11/19/2014  . Diabetes mellitus type I ([place]) 11/19/2014  . DDD (degenerative disc disease), cervical 03/11/2014  . Cervical spondylosis with radiculopathy 03/11/2014  . Spondylolisthesis of cervical region 03/11/2014  . Vitreous hemorrhage of left eye ([place]) 03/03/2014  . DDD (degenerative disc disease), thoracic 01/07/2014  . Anemia due to stage 3 chronic kidney disease ([place]) 12/16/2013  . Osteoporosis 11/14/2013  . Chronic diastolic heart failure ([place]) 09/18/2013  . Cataract due to secondary diabetes ([place]) 08/26/2013  . 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 associated with type 2 diabetes mellitus ([place]) 08/26/2013  . Secondary diabetes mellitus with ophthalmic complication ([place]) 19/75/8832  . Type 1 diabetes  mellitus with hyperosmolar coma ([place]) 08/26/2013  . Hypothyroidism due to acquired atrophy of thyroid 08/02/2013  . Acquired hypothyroidism 08/02/2013  . Cerebral artery occlusion with cerebral  infarction ([place]) 07/14/2013  . History of diabetic gastroparesis 04/09/2013  . Irritable bowel syndrome with diarrhea 04/09/2013  . Diarrhea 04/09/2013  . History of endocrine, metabolic or immunity disorder 04/09/2013  . Anemia of chronic disease 04/08/2013  . 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 ([place]) 16/10/9602  . CKD stage 3 due to type 1 diabetes mellitus ([place]) 04/08/2013  . Anemia 04/08/2013    Past Surgical History:  Procedure Laterality Date  . ABDOMINAL HYSTERECTOMY    . APPENDECTOMY    . BREAST SURGERY     B/L biopsy and lumpectomy   . CARDIAC CATHETERIZATION    . CARPAL TUNNEL RELEASE    . CATARACT EXTRACTION W/ INTRAOCULAR LENS IMPLANT     right eye  . COLONOSCOPY W/ BIOPSIES AND POLYPECTOMY    . CORONARY ARTERY BYPASS GRAFT    . coronary artery stents     at [place] and [place]  . ECTOPIC PREGNANCY SURGERY    . NASAL SEPTUM SURGERY    . OPEN REDUCTION INTERNAL FIXATION (ORIF) DISTAL RADIAL FRACTURE Right 05/06/2015   Procedure: OPEN REDUCTION INTERNAL FIXATION (ORIF) RIGHT DISTAL RADIAL FRACTURE AND REPAIRS AS NEEDED;  Surgeon: Iran Planas, MD;  Location: [facility];  Service: Orthopedics;  Laterality: Right;  . ORIF WRIST FRACTURE Left 11/05/2014  . ORIF WRIST FRACTURE Left 11/05/2014   Procedure: OPEN REDUCTION INTERNAL FIXATION (ORIF) LEFT WRIST FRACTURE AND REPAIR AS INDICATED;  Surgeon: Iran Planas, MD;  Location: [facility];  Service: Orthopedics;  Laterality: Left;  . TRIGGER FINGER RELEASE      Social History   Tobacco Use  . Smoking status: Former Smoker    Types: Cigarettes  . Smokeless tobacco: Never Used  . Tobacco comment:  " Quit smoking cigarettes in 20's "  Substance Use Topics  . Alcohol use: Yes    Comment: occasional beer or wine    Family History   Problem Relation Age of Onset  . Breast cancer Mother   . Heart disease Father   . Hypertension Father   . Breast cancer Sister      Current medication list and allergy/intolerance information reviewed:    Current Outpatient Medications  Medication Sig Dispense Refill  . AMBULATORY NON FORMULARY MEDICATION Incontinence pads per patient preference, #unlimited, refill x99 Fax to [phone] 1 Units prn  . arginine 500 MG tablet Take 500 mg by mouth as directed.     . Armodafinil 250 MG tablet Take 1 tablet (250 mg total) by mouth daily. OR can take 0.5 tablet (125 mg) po bid 90 tablet 1  . aspirin EC 325 MG tablet Take 1 tablet (325 mg total) by mouth daily. 30 tablet 0  . carvedilol (COREG) 12.5 MG tablet Take 1 tablet (12.5 mg total) by mouth 2 (two) times daily with a meal. 180 tablet 3  . Cholecalciferol (VITAMIN D3) 5000 UNITS TABS Take 1,000 Units by mouth daily.     . Continuous Blood Gluc Sensor (DEXCOM G6 SENSOR) MISC USE TO CONTINUOUSLY MONITOR BLOOD SUGARS. CHANGE EVERY 10 DAYS    . cyclobenzaprine (FLEXERIL) 10 MG tablet Take 0.5-1 tablets (5-10 mg total) by mouth 3 (three) times daily as needed for muscle spasms. Caution: can cause drowsiness 60 tablet 1  . diphenhydrAMINE HCl (BENADRYL ALLERGY PO) Take by mouth.    . diphenoxylate-atropine (LOMOTIL) 2.5-0.025 MG tablet Take 1 tablet by mouth 4 (four) times daily as needed for diarrhea or loose stools. 90 tablet 0  . ELDERBERRY PO Take by mouth.    Marland Kitchen  Esomeprazole Magnesium (NEXIUM PO) Take 20 mg by mouth every morning.     . gabapentin (NEURONTIN) 100 MG capsule Take 1-2 capsules (100-200 mg total) by mouth 3 (three) times daily as needed. 180 capsule 5  . insulin aspart (NOVOLOG) 100 UNIT/ML injection USE VIA INSULIN PUMP TOTAL DAILY INSULIN 40 UNITS    . Insulin Human (INSULIN PUMP) SOLN Inject into the skin continuous. Novolog insulin    . ketoconazole (NIZORAL) 2 % shampoo APPLY 1 APPLICATION TOPICALLY 2 (TWO) TIMES A  WEEK. 240 mL 1  . lidocaine-prilocaine (EMLA) cream Apply 1 application topically every 2 (two) hours as needed. Please run with good Rx discount coupon 30 g 0  . Linoleic Acid Conjugated 1000 MG CAPS Take 1,000 mg by mouth daily.     . Melatonin 5 MG CHEW Chew 1 tablet by mouth at bedtime.    . nitroGLYCERIN (NITROLINGUAL) 0.4 MG/SPRAY spray Place 1 spray under the tongue every 5 (five) minutes x 3 doses as needed for chest pain. 12 g 1  . NON FORMULARY Chloroella    . OVER THE COUNTER MEDICATION as directed. Magnesium L-Treonate    . Pancrelipase, Lip-Prot-Amyl, (CREON) 3000-9500 units CPEP Take by mouth.    . spironolactone (ALDACTONE) 25 MG tablet Take by mouth.    . thyroid (ARMOUR THYROID) 60 MG tablet Take 1 tablet (60 mg total) by mouth daily before breakfast. 90 tablet 3  . torsemide ([name]) 20 MG tablet TAKE 1 TABLET ONCE DAILY INTHE MORNING. MAY TAKE AN   EXTRA 1/2 TABLET AS        NEEDED (Patient taking differently: every other day. TAKE 1 TABLET ONCE DAILY INTHE MORNING. MAY TAKE AN   EXTRA 1/2 TABLET AS NEEDED) 135 tablet 0  . ciprofloxacin (CIPRO) 500 MG tablet Take 1 tablet (500 mg total) by mouth 2 (two) times daily. 14 tablet 0   No current facility-administered medications for this visit.    Allergies  Allergen Reactions  . Morphine Anaphylaxis and Shortness Of Breath  . Morphine And Related Shortness Of Breath  . Penicillins Hives    Has patient had a PCN reaction causing immediate rash, facial/tongue/throat swelling, SOB or lightheadedness with hypotension: Yes Has patient had a PCN reaction causing severe rash involving mucus membranes or skin necrosis: No Has patient had a PCN reaction that required hospitalization No Has patient had a PCN reaction occurring within the last 10 years: No If all of the above answers are "NO", then may proceed with Cephalosporin use.  . Tramadol Anaphylaxis  . Tramadol-Acetaminophen Other (See Comments)    Small vessel heart  attack Other reaction(s): Cardiovascular Arrest (ALLERGY/intolerance), Other (See Comments)   . Acetaminophen Other (See Comments)    Alters insulin pump readings   . Amitriptyline Other (See Comments)    Severe headache/ out of body feeling  . Codeine Other (See Comments)    Severe headaches/ out of body feeling   . Ibuprofen Other (See Comments)    Messes up CGM reading on glucose monitor    . Losartan Cough  . Propoxyphene Other (See Comments)    Severe headaches / out of body feeling   . Shellfish Allergy Diarrhea and Nausea And Vomiting  . Statins Other (See Comments)    Muscle pains Other reaction(s): Other  . Sulfamethoxazole Other (See Comments)    Mouth ulcers   . Trichophyton Itching and Other (See Comments)    (fungus) sinusitis       Review of Systems:  Constitutional:  No  fever, no chills  Gastrointestinal: No  abdominal pain, No  constipation   Genitourinary: +  incontinence, No abnormal genital bleeding, no frequency, + dysuria   Endocrine: "always cold"  Exam:  BP (!) 166/71 (BP Location: Right Arm, Patient Position: Sitting, Cuff Size: Normal)   Pulse 69   Temp 98.2 F (36.8 C) (Oral)   Wt 93 lb 1.9 oz (42.2 kg)   BMI 18.81 kg/m   Constitutional: VS see above. General Appearance: alert, well-developed, well-nourished, NAD  Eyes: Normal lids and conjunctive, non-icteric sclera  Respiratory: Normal respiratory effort. no wheeze, no rhonchi, no rales  Cardiovascular: S1/S2 normal, no murmur, no rub/gallop auscultated. RRR. +1 lower extremity edema.   Gastrointestinal: Nontender, no masses. No hepatomegaly, no splenomegaly.     No results found for this or any previous visit (from the past 72 hour(s)).  No results found.   ASSESSMENT/PLAN: The encounter diagnosis was Urinary incontinence, unspecified type.  Urinary incontinence suspect complicated UTI vs other anatomic variant / complication  - urinalysis - Urine culture  -  ciprofloxacin ordered and Rx printed; instructed patient to fill if symptoms worsen before before culture results or if culture results indicate UTI (may change based on results sensitivities) - consider urology f/u given history of anatomic abnormalities which may be contributing to symptoms if UA ans urine cx are unremarkable    Orders Placed This Encounter  Procedures  . Urine Culture  . Urinalysis, Routine w reflex microscopic    Meds ordered this encounter  Medications  . ciprofloxacin (CIPRO) 500 MG tablet    Sig: Take 1 tablet (500 mg total) by mouth 2 (two) times daily.    Dispense:  14 tablet    Refill:  0    There are no Patient Instructions on file for this visit.      Visit summary with medication list and pertinent instructions was printed for patient to review. All questions at time of visit were answered - patient instructed to contact office with any additional concerns or updates. ER/RTC precautions were reviewed with the patient.      Follow-up plan: Return for RECHECK PENDING RESULTS / IF WORSE OR CHANGE.

## 2019-12-05 ENCOUNTER — Encounter: Payer: Self-pay | Admitting: Osteopathic Medicine

## 2019-12-05 MED ORDER — ARMODAFINIL 250 MG PO TABS
250.0000 mg | ORAL_TABLET | Freq: Every day | ORAL | 1 refills | Status: DC
Start: 2019-12-05 — End: 2020-05-07

## 2019-12-05 NOTE — Telephone Encounter (Signed)
Armodafinil was sent to [facility] on 10/30/2019. Okay to send to [facility] instead?

## 2019-12-06 LAB — URINE CULTURE
MICRO NUMBER:: 11180817
SPECIMEN QUALITY:: ADEQUATE

## 2019-12-06 LAB — URINALYSIS, ROUTINE W REFLEX MICROSCOPIC
Bacteria, UA: NONE SEEN /HPF
Bilirubin Urine: NEGATIVE
Glucose, UA: NEGATIVE
Hgb urine dipstick: NEGATIVE
Hyaline Cast: NONE SEEN /LPF
Ketones, ur: NEGATIVE
Nitrite: NEGATIVE
Specific Gravity, Urine: 1.01 (ref 1.001–1.03)
Squamous Epithelial / HPF: NONE SEEN /HPF (ref <=5)
WBC, UA: NONE SEEN /HPF (ref 0–5)
pH: 6 (ref 5.0–8.0)

## 2019-12-13 ENCOUNTER — Inpatient Hospital Stay: Payer: 59

## 2019-12-13 ENCOUNTER — Telehealth: Payer: Self-pay

## 2019-12-13 ENCOUNTER — Other Ambulatory Visit: Payer: Self-pay

## 2019-12-13 ENCOUNTER — Inpatient Hospital Stay (HOSPITAL_BASED_OUTPATIENT_CLINIC_OR_DEPARTMENT_OTHER): Payer: 59 | Admitting: Hematology & Oncology

## 2019-12-13 ENCOUNTER — Inpatient Hospital Stay: Payer: 59 | Attending: Hematology & Oncology

## 2019-12-13 ENCOUNTER — Encounter: Payer: Self-pay | Admitting: Hematology & Oncology

## 2019-12-13 VITALS — BP 175/97 | HR 62 | Temp 97.6°F | Resp 16 | Wt 92.0 lb

## 2019-12-13 DIAGNOSIS — D500 Iron deficiency anemia secondary to blood loss (chronic): Secondary | ICD-10-CM

## 2019-12-13 DIAGNOSIS — D631 Anemia in chronic kidney disease: Secondary | ICD-10-CM | POA: Diagnosis present

## 2019-12-13 DIAGNOSIS — N189 Chronic kidney disease, unspecified: Secondary | ICD-10-CM | POA: Diagnosis present

## 2019-12-13 DIAGNOSIS — I2510 Atherosclerotic heart disease of native coronary artery without angina pectoris: Secondary | ICD-10-CM

## 2019-12-13 LAB — CMP (CANCER CENTER ONLY)
ALT: 9 U/L (ref 0–44)
AST: 17 U/L (ref 15–41)
Albumin: 3.9 g/dL (ref 3.5–5.0)
Alkaline Phosphatase: 68 U/L (ref 38–126)
Anion gap: 6 (ref 5–15)
BUN: 22 mg/dL (ref 8–23)
CO2: 27 mmol/L (ref 22–32)
Calcium: 9.6 mg/dL (ref 8.9–10.3)
Chloride: 99 mmol/L (ref 98–111)
Creatinine: 1.2 mg/dL — ABNORMAL HIGH (ref 0.44–1.00)
GFR, Estimated: 50 mL/min — ABNORMAL LOW (ref >60)
Glucose, Bld: 241 mg/dL — ABNORMAL HIGH (ref 70–99)
Potassium: 4.8 mmol/L (ref 3.5–5.1)
Sodium: 132 mmol/L — ABNORMAL LOW (ref 135–145)
Total Bilirubin: 0.4 mg/dL (ref 0.3–1.2)
Total Protein: 6.5 g/dL (ref 6.5–8.1)

## 2019-12-13 LAB — CBC WITH DIFFERENTIAL (CANCER CENTER ONLY)
Abs Immature Granulocytes: 0.02 10*3/uL (ref 0.00–0.07)
Basophils Absolute: 0.1 10*3/uL (ref 0.0–0.1)
Basophils Relative: 1 %
Eosinophils Absolute: 0.3 10*3/uL (ref 0.0–0.5)
Eosinophils Relative: 4 %
HCT: 36 % (ref 36.0–46.0)
Hemoglobin: 11.3 g/dL — ABNORMAL LOW (ref 12.0–15.0)
Immature Granulocytes: 0 %
Lymphocytes Relative: 19 %
Lymphs Abs: 1.2 10*3/uL (ref 0.7–4.0)
MCH: 27.4 pg (ref 26.0–34.0)
MCHC: 31.4 g/dL (ref 30.0–36.0)
MCV: 87.2 fL (ref 80.0–100.0)
Monocytes Absolute: 0.6 10*3/uL (ref 0.1–1.0)
Monocytes Relative: 9 %
Neutro Abs: 4.4 10*3/uL (ref 1.7–7.7)
Neutrophils Relative %: 67 %
Platelet Count: 296 10*3/uL (ref 150–400)
RBC: 4.13 MIL/uL (ref 3.87–5.11)
RDW: 15.7 % — ABNORMAL HIGH (ref 11.5–15.5)
WBC Count: 6.5 10*3/uL (ref 4.0–10.5)
nRBC: 0 % (ref 0.0–0.2)

## 2019-12-13 LAB — RETICULOCYTES
Immature Retic Fract: 5.8 % (ref 2.3–15.9)
RBC.: 4.13 MIL/uL (ref 3.87–5.11)
Retic Count, Absolute: 45.8 10*3/uL (ref 19.0–186.0)
Retic Ct Pct: 1.1 % (ref 0.4–3.1)

## 2019-12-13 LAB — SAVE SMEAR(SSMR), FOR PROVIDER SLIDE REVIEW

## 2019-12-13 NOTE — Progress Notes (Signed)
Hematology and Oncology Follow Up Visit  Brittney Pace [id number] 1955/05/04 64 y.o. 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  Overall, she is doing better now.  Her hemoglobin has finally responded to the Aranesp.  Her hemoglobin is 11.3 today.  She does not need any Aranesp.  She is 10 some abdominal issues.  I think she is being followed by gastroenterology.  It sounds like she has a urinary tract infection and is on ciprofloxacin for this.  She does have the diabetic pump.  Her blood sugar today is 241.  She said that her blood sugar was on the low side this morning.  Her iron levels we last saw her were okay.  The ferritin was 86 with an iron saturation of 32%.  She has had no fever.  She has had no cough.  She has had no rashes.  Overall, I would say performance status is by ECOG 1.   Medications:  Allergies as of 12/13/2019      Reactions   Morphine Anaphylaxis, Shortness Of Breath   Morphine And Related Shortness Of Breath   Penicillins Hives   Has patient had a PCN reaction causing immediate rash, facial/tongue/throat swelling, SOB or lightheadedness with hypotension: Yes Has patient had a PCN reaction causing severe rash involving mucus membranes or skin necrosis: No Has patient had a PCN reaction that required hospitalization No Has patient had a PCN reaction occurring within the last 10 years: No If all of the above answers are "NO", then may proceed with Cephalosporin use.   Tramadol Anaphylaxis   Tramadol-acetaminophen Other (See Comments)   Small vessel heart attack Other reaction(s): Cardiovascular Arrest (ALLERGY/intolerance), Other (See Comments)   Acetaminophen Other (See Comments)   Alters insulin pump readings    Amitriptyline Other (See Comments)   Severe headache/ out of body feeling   Codeine Other (See Comments)   Severe headaches/ out of body feeling   Ibuprofen Other (See Comments)   Messes up CGM reading on glucose monitor   Losartan Cough   Propoxyphene Other (See Comments)   Severe headaches / out of body feeling   Shellfish Allergy Diarrhea, Nausea And Vomiting   Statins Other (See Comments)   Muscle pains Other reaction(s): Other   Sulfamethoxazole Other (See Comments)   Mouth ulcers   Trichophyton Itching, Other (See Comments)   (fungus) sinusitis      Medication List       Accurate as of December 13, 2019 12:21 PM. If you have any questions, ask your nurse or doctor.        AMBULATORY NON FORMULARY MEDICATION Incontinence pads per patient preference, #unlimited, refill x99 Fax to [phone]   arginine 500 MG tablet Take 500 mg by mouth as directed.   Armodafinil 250 MG tablet Take 1 tablet (250 mg total) by mouth daily. OR can take 0.5 tablet (125 mg) po bid   aspirin EC 325 MG tablet Take 1 tablet (325 mg total) by mouth daily.   BENADRYL ALLERGY PO Take by mouth.   carvedilol 12.5 MG tablet Commonly known as: COREG Take 1 tablet (12.5 mg total) by mouth 2 (two) times daily with a meal.   ciprofloxacin 500 MG tablet Commonly known as: CIPRO Take 1 tablet (500 mg total) by mouth 2 (two)  times daily.   Creon 3000-9500 units Cpep Generic drug: Pancrelipase (Lip-Prot-Amyl) Take by mouth.   cyclobenzaprine 10 MG tablet Commonly known as: FLEXERIL Take 0.5-1 tablets (5-10 mg total) by mouth 3 (three) times daily as needed for muscle spasms. Caution: can cause drowsiness   Dexcom G6 Sensor Misc USE TO CONTINUOUSLY MONITOR BLOOD SUGARS. CHANGE EVERY 10 DAYS   diphenoxylate-atropine 2.5-0.025 MG tablet Commonly known as: LOMOTIL Take 1 tablet by mouth 4 (four) times daily as needed for diarrhea or loose stools.   ELDERBERRY PO Take by mouth.     gabapentin 100 MG capsule Commonly known as: NEURONTIN Take 1-2 capsules (100-200 mg total) by mouth 3 (three) times daily as needed.   insulin aspart 100 UNIT/ML injection Commonly known as: novoLOG USE VIA INSULIN PUMP TOTAL DAILY INSULIN 40 UNITS   insulin pump Soln Inject into the skin continuous. Novolog insulin   ketoconazole 2 % shampoo Commonly known as: NIZORAL APPLY 1 APPLICATION TOPICALLY 2 (TWO) TIMES A WEEK.   lidocaine-prilocaine cream Commonly known as: EMLA Apply 1 application topically every 2 (two) hours as needed. Please run with good Rx discount coupon   Linoleic Acid Conjugated 1000 MG Caps Take 1,000 mg by mouth daily.   Melatonin 5 MG Chew Chew 1 tablet by mouth at bedtime.   NEXIUM PO Take 20 mg by mouth every morning.   nitroGLYCERIN 0.4 MG/SPRAY spray Commonly known as: NITROLINGUAL Place 1 spray under the tongue every 5 (five) minutes x 3 doses as needed for chest pain.   NON FORMULARY Chloroella   OVER THE COUNTER MEDICATION as directed. Magnesium L-Treonate   spironolactone 25 MG tablet Commonly known as: ALDACTONE Take by mouth.   thyroid 60 MG tablet Commonly known as: Armour Thyroid Take 1 tablet (60 mg total) by mouth daily before breakfast.   torsemide 20 MG tablet Commonly known as: [name] TAKE 1 TABLET ONCE DAILY INTHE MORNING. MAY TAKE AN   EXTRA 1/2 TABLET AS        NEEDED What changed: See the new instructions.   Vitamin D3 125 MCG (5000 UT) Tabs Take 1,000 Units by mouth daily.       Allergies:  Allergies  Allergen Reactions  . Morphine Anaphylaxis and Shortness Of Breath  . Morphine And Related Shortness Of Breath  . Penicillins Hives    Has patient had a PCN reaction causing immediate rash, facial/tongue/throat swelling, SOB or lightheadedness with hypotension: Yes Has patient had a PCN reaction causing severe rash involving mucus membranes or skin necrosis: No Has patient had a PCN reaction that required  hospitalization No Has patient had a PCN reaction occurring within the last 10 years: No If all of the above answers are "NO", then may proceed with Cephalosporin use.  . Tramadol Anaphylaxis  . Tramadol-Acetaminophen Other (See Comments)    Small vessel heart attack Other reaction(s): Cardiovascular Arrest (ALLERGY/intolerance), Other (See Comments)   . Acetaminophen Other (See Comments)    Alters insulin pump readings   . Amitriptyline Other (See Comments)    Severe headache/ out of body feeling  . Codeine Other (See Comments)    Severe headaches/ out of body feeling   . Ibuprofen Other (See Comments)    Messes up CGM reading on glucose monitor    . Losartan Cough  . Propoxyphene Other (See Comments)    Severe headaches / out of body feeling   . Shellfish Allergy Diarrhea and Nausea And Vomiting  . Statins Other (See Comments)  Muscle pains Other reaction(s): Other  . Sulfamethoxazole Other (See Comments)    Mouth ulcers   . Trichophyton Itching and Other (See Comments)    (fungus) sinusitis     Past Medical History, Surgical history, Social history, and Family History were reviewed and updated.  Review of Systems: Review of Systems  Constitutional: Positive for malaise/fatigue.  HENT: Positive for ear discharge.   Eyes: Positive for blurred vision.  Respiratory: Negative.   Cardiovascular: Positive for claudication.  Gastrointestinal: Positive for nausea.  Genitourinary: Positive for dysuria and urgency.  Musculoskeletal: Positive for joint pain, myalgias and neck pain.  Skin: Negative.   Neurological: Positive for dizziness, focal weakness and headaches.  Endo/Heme/Allergies: Negative.   Psychiatric/Behavioral: Negative.      Physical Exam:  weight is 92 lb (41.7 kg). Her oral temperature is 97.6 F (36.4 C). Her blood pressure is 175/97 (abnormal) and her pulse is 62. Her respiration is 16 and oxygen saturation is 95%.   Wt Readings from Last 3  Encounters:  12/13/19 92 lb (41.7 kg)  12/03/19 93 lb 1.9 oz (42.2 kg)  11/22/19 99 lb (44.9 kg)    Physical Exam Vitals reviewed.  HENT:     Head: Normocephalic and atraumatic.  Eyes:     Pupils: Pupils are equal, round, and reactive to light.  Cardiovascular:     Rate and Rhythm: Normal rate and regular rhythm.     Heart sounds: Normal heart sounds.  Pulmonary:     Effort: Pulmonary effort is normal.     Breath sounds: Normal breath sounds.  Abdominal:     General: Bowel sounds are normal.     Palpations: Abdomen is soft.  Musculoskeletal:        General: No tenderness or deformity. Normal range of motion.     Cervical back: Normal range of motion.  Lymphadenopathy:     Cervical: No cervical adenopathy.  Skin:    General: Skin is warm and dry.     Findings: No erythema or rash.  Neurological:     Mental Status: She is alert and oriented to person, place, and time.  Psychiatric:        Behavior: Behavior normal.        Thought Content: Thought content normal.        Judgment: Judgment normal.      Lab Results  Component Value Date   WBC 6.5 12/13/2019   HGB 11.3 (L) 12/13/2019   HCT 36.0 12/13/2019   MCV 87.2 12/13/2019   PLT 296 12/13/2019   Lab Results  Component Value Date   FERRITIN 86 11/22/2019   IRON 98 11/22/2019   TIBC 306 11/22/2019   UIBC 208 11/22/2019   IRONPCTSAT 32 11/22/2019   Lab Results  Component Value Date   RETICCTPCT 1.1 12/13/2019   RBC 4.13 12/13/2019   RBC 4.13 12/13/2019   No results found for: KPAFRELGTCHN, LAMBDASER, KAPLAMBRATIO No results found for: IGGSERUM, IGA, IGMSERUM No results found for: Ronnald Ramp, A1GS, Gilford Silvius, MSPIKE, SPEI   Chemistry      Component Value Date/Time   NA 129 (L) 11/22/2019 1440   NA 138 07/17/2017 1128   K 4.2 11/22/2019 1440   CL 95 (L) 11/22/2019 1440   CO2 28 11/22/2019 1440   BUN 23 11/22/2019 1440   BUN 23 07/17/2017 1128   CREATININE 1.20 (H)  11/22/2019 1440   CREATININE 0.99 07/20/2018 0821      Component Value Date/Time  CALCIUM 9.8 11/22/2019 1440   ALKPHOS 73 11/22/2019 1440   AST 17 11/22/2019 1440   ALT 11 11/22/2019 1440   BILITOT 0.3 11/22/2019 1440       Impression and Plan: Brittney Pace is a very pleasant 64 yo female with multifactorial anemia.  I am glad that her hemoglobin fives, up.  Her reticulocyte count is a little bit better.  Hopefully the Aranesp is stimulating the bone marrow.  I just wish that her blood sugars were under little bit better control.  I am sure that her endocrinologist will work on this.  We will go ahead and just follow her up in 3 weeks.  I think we have to get her back every 3 weeks for right now so that we can maintain her hemoglobin had a decent level.  I just want her feeling well for the Christmas holiday.     [name], MD 11/19/202112:21 PM

## 2019-12-13 NOTE — Telephone Encounter (Signed)
appts made per 12/13/19 los, pt req not to print    AOM

## 2019-12-16 LAB — IRON AND TIBC
Iron: 140 ug/dL (ref 41–142)
Saturation Ratios: 44 % (ref 21–57)
TIBC: 321 ug/dL (ref 236–444)
UIBC: 181 ug/dL (ref 120–384)

## 2019-12-16 LAB — FERRITIN: Ferritin: 27 ng/mL (ref 11–307)

## 2020-01-02 ENCOUNTER — Encounter: Payer: Self-pay | Admitting: Family

## 2020-01-02 ENCOUNTER — Inpatient Hospital Stay: Payer: 59

## 2020-01-02 ENCOUNTER — Other Ambulatory Visit: Payer: Self-pay

## 2020-01-02 ENCOUNTER — Inpatient Hospital Stay: Payer: 59 | Attending: Hematology & Oncology

## 2020-01-02 ENCOUNTER — Inpatient Hospital Stay (HOSPITAL_BASED_OUTPATIENT_CLINIC_OR_DEPARTMENT_OTHER): Payer: 59 | Admitting: Family

## 2020-01-02 VITALS — BP 182/81 | HR 118 | Temp 97.8°F | Resp 19 | Ht 60.0 in | Wt 97.8 lb

## 2020-01-02 DIAGNOSIS — D500 Iron deficiency anemia secondary to blood loss (chronic): Secondary | ICD-10-CM | POA: Diagnosis not present

## 2020-01-02 DIAGNOSIS — N189 Chronic kidney disease, unspecified: Secondary | ICD-10-CM | POA: Insufficient documentation

## 2020-01-02 DIAGNOSIS — D631 Anemia in chronic kidney disease: Secondary | ICD-10-CM | POA: Diagnosis present

## 2020-01-02 DIAGNOSIS — Z8673 Personal history of transient ischemic attack (TIA), and cerebral infarction without residual deficits: Secondary | ICD-10-CM | POA: Diagnosis not present

## 2020-01-02 DIAGNOSIS — Z79899 Other long term (current) drug therapy: Secondary | ICD-10-CM | POA: Diagnosis not present

## 2020-01-02 DIAGNOSIS — E1022 Type 1 diabetes mellitus with diabetic chronic kidney disease: Secondary | ICD-10-CM

## 2020-01-02 DIAGNOSIS — G629 Polyneuropathy, unspecified: Secondary | ICD-10-CM | POA: Diagnosis not present

## 2020-01-02 DIAGNOSIS — N1831 Chronic kidney disease, stage 3a: Secondary | ICD-10-CM

## 2020-01-02 DIAGNOSIS — D638 Anemia in other chronic diseases classified elsewhere: Secondary | ICD-10-CM

## 2020-01-02 DIAGNOSIS — N183 Chronic kidney disease, stage 3 unspecified: Secondary | ICD-10-CM

## 2020-01-02 DIAGNOSIS — I2510 Atherosclerotic heart disease of native coronary artery without angina pectoris: Secondary | ICD-10-CM

## 2020-01-02 LAB — CBC WITH DIFFERENTIAL (CANCER CENTER ONLY)
Abs Immature Granulocytes: 0.08 10*3/uL — ABNORMAL HIGH (ref 0.00–0.07)
Basophils Absolute: 0.1 10*3/uL (ref 0.0–0.1)
Basophils Relative: 1 %
Eosinophils Absolute: 0.2 10*3/uL (ref 0.0–0.5)
Eosinophils Relative: 3 %
HCT: 32.1 % — ABNORMAL LOW (ref 36.0–46.0)
Hemoglobin: 10.1 g/dL — ABNORMAL LOW (ref 12.0–15.0)
Immature Granulocytes: 1 %
Lymphocytes Relative: 23 %
Lymphs Abs: 1.5 10*3/uL (ref 0.7–4.0)
MCH: 27 pg (ref 26.0–34.0)
MCHC: 31.5 g/dL (ref 30.0–36.0)
MCV: 85.8 fL (ref 80.0–100.0)
Monocytes Absolute: 0.7 10*3/uL (ref 0.1–1.0)
Monocytes Relative: 10 %
Neutro Abs: 4 10*3/uL (ref 1.7–7.7)
Neutrophils Relative %: 62 %
Platelet Count: 295 10*3/uL (ref 150–400)
RBC: 3.74 MIL/uL — ABNORMAL LOW (ref 3.87–5.11)
RDW: 15.7 % — ABNORMAL HIGH (ref 11.5–15.5)
WBC Count: 6.5 10*3/uL (ref 4.0–10.5)
nRBC: 0 % (ref 0.0–0.2)

## 2020-01-02 LAB — CMP (CANCER CENTER ONLY)
ALT: 15 U/L (ref 0–44)
AST: 24 U/L (ref 15–41)
Albumin: 4 g/dL (ref 3.5–5.0)
Alkaline Phosphatase: 60 U/L (ref 38–126)
Anion gap: 9 (ref 5–15)
BUN: 28 mg/dL — ABNORMAL HIGH (ref 8–23)
CO2: 25 mmol/L (ref 22–32)
Calcium: 9.5 mg/dL (ref 8.9–10.3)
Chloride: 96 mmol/L — ABNORMAL LOW (ref 98–111)
Creatinine: 1.09 mg/dL — ABNORMAL HIGH (ref 0.44–1.00)
GFR, Estimated: 56 mL/min — ABNORMAL LOW (ref >60)
Glucose, Bld: 183 mg/dL — ABNORMAL HIGH (ref 70–99)
Potassium: 4.3 mmol/L (ref 3.5–5.1)
Sodium: 130 mmol/L — ABNORMAL LOW (ref 135–145)
Total Bilirubin: 0.4 mg/dL (ref 0.3–1.2)
Total Protein: 6.3 g/dL — ABNORMAL LOW (ref 6.5–8.1)

## 2020-01-02 LAB — RETICULOCYTES
Immature Retic Fract: 3.5 % (ref 2.3–15.9)
RBC.: 3.7 MIL/uL — ABNORMAL LOW (ref 3.87–5.11)
Retic Count, Absolute: 17.4 10*3/uL — ABNORMAL LOW (ref 19.0–186.0)
Retic Ct Pct: 0.5 % (ref 0.4–3.1)

## 2020-01-02 MED ORDER — DARBEPOETIN ALFA 300 MCG/0.6ML IJ SOSY: 300.0000 ug | PREFILLED_SYRINGE | Freq: Once | INTRAMUSCULAR | Status: DC

## 2020-01-02 MED ORDER — DARBEPOETIN ALFA 300 MCG/0.6ML IJ SOSY
PREFILLED_SYRINGE | INTRAMUSCULAR | Status: AC
  Filled 2020-01-02: qty 0.6

## 2020-01-02 NOTE — Progress Notes (Signed)
Hematology and Oncology Follow Up Visit  Brittney Pace [id number] 1956-01-04 64 y.o. 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 She is having a hard time with her blood sugars despite having the insulin pump.  She also has chronically high blood pressure. She verbalized that she is taking her medications as prescribed.  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 She states that she has "not felt right for a while now". Her husband brings her to her appointments.  She is symptomatic with fatigue and dizziness. Her CMP is indicative of dehydration and she states that she will increase her hydration and try adding sugar free Gatorade to her regimen.  No fever, chills, n/v, cough, chest pain, palpitations, abdominal pain or changes in bowel or bladder habits.  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  No swelling noted in her extremities at this time. Pedal pulses are 2+.  No falls or syncopal episodes to report.  She states that her appetite comes and goes. She is trying her best to stay well hydrated. Her weight is stable at 97 lbs.   ECOG Performance Status: 1 - Symptomatic but completely ambulatory  Medications:  Allergies as of 01/02/2020      Reactions   Morphine Anaphylaxis, Shortness Of Breath   Morphine And Related Shortness Of Breath   Penicillins Hives   Has patient had a PCN reaction causing immediate rash, facial/tongue/throat swelling, SOB or lightheadedness with hypotension: Yes Has patient had a PCN reaction causing severe rash involving mucus membranes or skin necrosis: No Has  patient had a PCN reaction that required hospitalization No Has patient had a PCN reaction occurring within the last 10 years: No If all of the above answers are "NO", then may proceed with Cephalosporin use.   Tramadol Anaphylaxis   Tramadol-acetaminophen Other (See Comments)   Small vessel heart attack Other reaction(s): Cardiovascular Arrest (ALLERGY/intolerance), Other (See Comments)   Acetaminophen Other (See Comments)   Alters insulin pump readings   Amitriptyline Other (See Comments)   Severe headache/ out of body feeling   Codeine Other (See Comments)   Severe headaches/ out of body feeling   Ibuprofen Other (See Comments)   Messes up CGM reading on glucose monitor   Losartan Cough   Propoxyphene Other (See Comments)   Severe headaches / out of body feeling   Shellfish Allergy Diarrhea, Nausea And Vomiting   Statins Other (See Comments)   Muscle pains Other reaction(s): Other   Sulfamethoxazole Other (See Comments)   Mouth ulcers   Trichophyton Itching, Other (See Comments)   (fungus) sinusitis      Medication List       Accurate as of January 02, 2020  3:35 PM. If you have any questions, ask your nurse or doctor.        AMBULATORY NON FORMULARY MEDICATION Incontinence pads per patient preference, #unlimited, refill x99 Fax to [phone]   arginine 500 MG tablet Take 500 mg by mouth as directed.   Armodafinil 250 MG tablet Take 1 tablet (250 mg total) by mouth daily. OR can take 0.5 tablet (125 mg) po bid   aspirin  EC 325 MG tablet Take 1 tablet (325 mg total) by mouth daily.   BENADRYL ALLERGY PO Take by mouth.   carvedilol 12.5 MG tablet Commonly known as: COREG Take 1 tablet (12.5 mg total) by mouth 2 (two) times daily with a meal.   ciprofloxacin 500 MG tablet Commonly known as: CIPRO Take 1 tablet (500 mg total) by mouth 2 (two) times daily.   Creon 3000-9500 units Cpep Generic drug: Pancrelipase (Lip-Prot-Amyl) Take by mouth.    cyclobenzaprine 10 MG tablet Commonly known as: FLEXERIL Take 0.5-1 tablets (5-10 mg total) by mouth 3 (three) times daily as needed for muscle spasms. Caution: can cause drowsiness   Dexcom G6 Sensor Misc USE TO CONTINUOUSLY MONITOR BLOOD SUGARS. CHANGE EVERY 10 DAYS   diphenoxylate-atropine 2.5-0.025 MG tablet Commonly known as: LOMOTIL Take 1 tablet by mouth 4 (four) times daily as needed for diarrhea or loose stools.   ELDERBERRY PO Take by mouth.   gabapentin 100 MG capsule Commonly known as: NEURONTIN Take 1-2 capsules (100-200 mg total) by mouth 3 (three) times daily as needed.   insulin aspart 100 UNIT/ML injection Commonly known as: novoLOG USE VIA INSULIN PUMP TOTAL DAILY INSULIN 40 UNITS   insulin pump Soln Inject into the skin continuous. Novolog insulin   ketoconazole 2 % shampoo Commonly known as: NIZORAL APPLY 1 APPLICATION TOPICALLY 2 (TWO) TIMES A WEEK.   lidocaine-prilocaine cream Commonly known as: EMLA Apply 1 application topically every 2 (two) hours as needed. Please run with good Rx discount coupon   Linoleic Acid Conjugated 1000 MG Caps Take 1,000 mg by mouth daily.   Melatonin 5 MG Chew Chew 1 tablet by mouth at bedtime.   NEXIUM PO Take 20 mg by mouth every morning.   nitroGLYCERIN 0.4 MG/SPRAY spray Commonly known as: NITROLINGUAL Place 1 spray under the tongue every 5 (five) minutes x 3 doses as needed for chest pain.   NON FORMULARY Chloroella   OVER THE COUNTER MEDICATION as directed. Magnesium L-Treonate   spironolactone 25 MG tablet Commonly known as: ALDACTONE Take by mouth.   thyroid 60 MG tablet Commonly known as: Armour Thyroid Take 1 tablet (60 mg total) by mouth daily before breakfast.   torsemide 20 MG tablet Commonly known as: [name] TAKE 1 TABLET ONCE DAILY INTHE MORNING. MAY TAKE AN   EXTRA 1/2 TABLET AS        NEEDED What changed: See the new instructions.   Vitamin D3 125 MCG (5000 UT) Tabs Take 1,000  Units by mouth daily.       Allergies:  Allergies  Allergen Reactions  . Morphine Anaphylaxis and Shortness Of Breath  . Morphine And Related Shortness Of Breath  . Penicillins Hives    Has patient had a PCN reaction causing immediate rash, facial/tongue/throat swelling, SOB or lightheadedness with hypotension: Yes Has patient had a PCN reaction causing severe rash involving mucus membranes or skin necrosis: No Has patient had a PCN reaction that required hospitalization No Has patient had a PCN reaction occurring within the last 10 years: No If all of the above answers are "NO", then may proceed with Cephalosporin use.  . Tramadol Anaphylaxis  . Tramadol-Acetaminophen Other (See Comments)    Small vessel heart attack Other reaction(s): Cardiovascular Arrest (ALLERGY/intolerance), Other (See Comments)   . Acetaminophen Other (See Comments)    Alters insulin pump readings   . Amitriptyline Other (See Comments)    Severe headache/ out of body feeling  . Codeine Other (See Comments)  Severe headaches/ out of body feeling   . Ibuprofen Other (See Comments)    Messes up CGM reading on glucose monitor    . Losartan Cough  . Propoxyphene Other (See Comments)    Severe headaches / out of body feeling   . Shellfish Allergy Diarrhea and Nausea And Vomiting  . Statins Other (See Comments)    Muscle pains Other reaction(s): Other  . Sulfamethoxazole Other (See Comments)    Mouth ulcers   . Trichophyton Itching and Other (See Comments)    (fungus) sinusitis     Past Medical History, Surgical history, Social history, and Family History were reviewed and updated.  Review of Systems: All other 10 point review of systems is negative.   Physical Exam:  vitals were not taken for this visit.   Wt Readings from Last 3 Encounters:  12/13/19 92 lb (41.7 kg)  12/03/19 93 lb 1.9 oz (42.2 kg)  11/22/19 99 lb (44.9 kg)    Ocular: Sclerae unicteric, pupils equal, round and  reactive to light Ear-nose-throat: Oropharynx clear, dentition fair Lymphatic: No cervical or supraclavicular adenopathy Lungs no rales or rhonchi, good excursion bilaterally Heart regular rate and rhythm, no murmur appreciated Abd soft, nontender, positive bowel sounds MSK no focal spinal tenderness, no joint edema Neuro: non-focal, well-oriented, appropriate affect Breasts: Deferred   Lab Results  Component Value Date   WBC 6.5 01/02/2020   HGB 10.1 (L) 01/02/2020   HCT 32.1 (L) 01/02/2020   MCV 85.8 01/02/2020   PLT 295 01/02/2020   Lab Results  Component Value Date   FERRITIN 27 12/13/2019   IRON 140 12/13/2019   TIBC 321 12/13/2019   UIBC 181 12/13/2019   IRONPCTSAT 44 12/13/2019   Lab Results  Component Value Date   RETICCTPCT 0.5 01/02/2020   RBC 3.74 (L) 01/02/2020   RBC 3.70 (L) 01/02/2020   No results found for: KPAFRELGTCHN, LAMBDASER, KAPLAMBRATIO No results found for: IGGSERUM, IGA, IGMSERUM No results found for: Ronnald Ramp, A1GS, A2GS, Violet Baldy, MSPIKE, SPEI   Chemistry      Component Value Date/Time   NA 132 (L) 12/13/2019 1155   NA 138 07/17/2017 1128   K 4.8 12/13/2019 1155   CL 99 12/13/2019 1155   CO2 27 12/13/2019 1155   BUN 22 12/13/2019 1155   BUN 23 07/17/2017 1128   CREATININE 1.20 (H) 12/13/2019 1155   CREATININE 0.99 07/20/2018 0821      Component Value Date/Time   CALCIUM 9.6 12/13/2019 1155   ALKPHOS 68 12/13/2019 1155   AST 17 12/13/2019 1155   ALT 9 12/13/2019 1155   BILITOT 0.4 12/13/2019 1155       Impression and Plan: Brittney Pace is a very pleasant 64 yo female with multifactorial anemia. As mentioned above she is symptomatic with fatigue and dizziness.  I advised that she go to the ED but she states that this is nothing new for her and she does not feel that she needs to go at this time.  No ESA today for elevated BP and HR.  Iron studies are pending. We will replace if needed at a later time.   We will continue to follow along with her and plan to see her again in another 3 weeks.  She was encouraged to contact our office with any heme/onc related questions or concerns. We can certainly see her sooner if needed.   Laverna Peace, NP 12/9/20213:35 PM

## 2020-01-03 ENCOUNTER — Encounter: Payer: Self-pay | Admitting: Osteopathic Medicine

## 2020-01-03 DIAGNOSIS — G4489 Other headache syndrome: Secondary | ICD-10-CM

## 2020-01-03 DIAGNOSIS — L989 Disorder of the skin and subcutaneous tissue, unspecified: Secondary | ICD-10-CM

## 2020-01-03 LAB — IRON AND TIBC
Iron: 133 ug/dL (ref 41–142)
Saturation Ratios: 44 % (ref 21–57)
TIBC: 305 ug/dL (ref 236–444)
UIBC: 172 ug/dL (ref 120–384)

## 2020-01-03 LAB — FERRITIN: Ferritin: 59 ng/mL (ref 11–307)

## 2020-01-14 ENCOUNTER — Encounter: Payer: Self-pay | Admitting: *Deleted

## 2020-01-14 ENCOUNTER — Other Ambulatory Visit: Payer: Self-pay | Admitting: Hematology & Oncology

## 2020-01-20 ENCOUNTER — Telehealth: Payer: Self-pay | Admitting: Neurology

## 2020-01-20 MED ORDER — PREMARIN 0.625 MG/GM VA CREA: TOPICAL_CREAM | VAGINAL | 12 refills | Status: DC

## 2020-01-20 NOTE — Addendum Note (Signed)
Addended by: Maryla Morrow on: 01/20/2020 03:18 PM   Modules accepted: Orders

## 2020-01-20 NOTE — Telephone Encounter (Signed)
Fine by me    thanks

## 2020-01-20 NOTE — Telephone Encounter (Signed)
Patient has seen Dr. Felecia Shelling in the past and has been referred back to [place] for headaches. She is requesting to switch her care over to Dr. Jaynee Eagles. Would you both be ok with with this?

## 2020-01-20 NOTE — Telephone Encounter (Signed)
Ok with me 

## 2020-01-22 ENCOUNTER — Inpatient Hospital Stay (HOSPITAL_BASED_OUTPATIENT_CLINIC_OR_DEPARTMENT_OTHER): Payer: 59 | Admitting: Family

## 2020-01-22 ENCOUNTER — Other Ambulatory Visit: Payer: Self-pay

## 2020-01-22 ENCOUNTER — Telehealth: Payer: Self-pay

## 2020-01-22 ENCOUNTER — Inpatient Hospital Stay: Payer: 59

## 2020-01-22 ENCOUNTER — Encounter: Payer: Self-pay | Admitting: Family

## 2020-01-22 VITALS — BP 163/90 | HR 60 | Temp 97.6°F | Resp 18 | Ht 60.0 in | Wt 98.4 lb

## 2020-01-22 DIAGNOSIS — D631 Anemia in chronic kidney disease: Secondary | ICD-10-CM

## 2020-01-22 DIAGNOSIS — N189 Chronic kidney disease, unspecified: Secondary | ICD-10-CM | POA: Diagnosis not present

## 2020-01-22 DIAGNOSIS — N183 Chronic kidney disease, stage 3 unspecified: Secondary | ICD-10-CM

## 2020-01-22 DIAGNOSIS — D508 Other iron deficiency anemias: Secondary | ICD-10-CM | POA: Diagnosis not present

## 2020-01-22 DIAGNOSIS — N1831 Chronic kidney disease, stage 3a: Secondary | ICD-10-CM

## 2020-01-22 DIAGNOSIS — D500 Iron deficiency anemia secondary to blood loss (chronic): Secondary | ICD-10-CM

## 2020-01-22 DIAGNOSIS — I2510 Atherosclerotic heart disease of native coronary artery without angina pectoris: Secondary | ICD-10-CM

## 2020-01-22 DIAGNOSIS — D638 Anemia in other chronic diseases classified elsewhere: Secondary | ICD-10-CM

## 2020-01-22 DIAGNOSIS — E1022 Type 1 diabetes mellitus with diabetic chronic kidney disease: Secondary | ICD-10-CM

## 2020-01-22 LAB — CMP (CANCER CENTER ONLY)
ALT: 15 U/L (ref 0–44)
AST: 22 U/L (ref 15–41)
Albumin: 3.6 g/dL (ref 3.5–5.0)
Alkaline Phosphatase: 60 U/L (ref 38–126)
Anion gap: 5 (ref 5–15)
BUN: 25 mg/dL — ABNORMAL HIGH (ref 8–23)
CO2: 25 mmol/L (ref 22–32)
Calcium: 9.1 mg/dL (ref 8.9–10.3)
Chloride: 99 mmol/L (ref 98–111)
Creatinine: 1.23 mg/dL — ABNORMAL HIGH (ref 0.44–1.00)
GFR, Estimated: 49 mL/min — ABNORMAL LOW (ref >60)
Glucose, Bld: 153 mg/dL — ABNORMAL HIGH (ref 70–99)
Potassium: 5.1 mmol/L (ref 3.5–5.1)
Sodium: 129 mmol/L — ABNORMAL LOW (ref 135–145)
Total Bilirubin: 0.3 mg/dL (ref 0.3–1.2)
Total Protein: 5.8 g/dL — ABNORMAL LOW (ref 6.5–8.1)

## 2020-01-22 LAB — RETICULOCYTES
Immature Retic Fract: 4.1 % (ref 2.3–15.9)
RBC.: 3.35 MIL/uL — ABNORMAL LOW (ref 3.87–5.11)
Retic Count, Absolute: 25.5 10*3/uL (ref 19.0–186.0)
Retic Ct Pct: 0.8 % (ref 0.4–3.1)

## 2020-01-22 LAB — CBC WITH DIFFERENTIAL (CANCER CENTER ONLY)
Abs Immature Granulocytes: 0.06 10*3/uL (ref 0.00–0.07)
Basophils Absolute: 0.1 10*3/uL (ref 0.0–0.1)
Basophils Relative: 1 %
Eosinophils Absolute: 0.2 10*3/uL (ref 0.0–0.5)
Eosinophils Relative: 4 %
HCT: 28.6 % — ABNORMAL LOW (ref 36.0–46.0)
Hemoglobin: 9.1 g/dL — ABNORMAL LOW (ref 12.0–15.0)
Immature Granulocytes: 1 %
Lymphocytes Relative: 27 %
Lymphs Abs: 1.6 10*3/uL (ref 0.7–4.0)
MCH: 27.3 pg (ref 26.0–34.0)
MCHC: 31.8 g/dL (ref 30.0–36.0)
MCV: 85.9 fL (ref 80.0–100.0)
Monocytes Absolute: 0.6 10*3/uL (ref 0.1–1.0)
Monocytes Relative: 9 %
Neutro Abs: 3.5 10*3/uL (ref 1.7–7.7)
Neutrophils Relative %: 58 %
Platelet Count: 272 10*3/uL (ref 150–400)
RBC: 3.33 MIL/uL — ABNORMAL LOW (ref 3.87–5.11)
RDW: 15.9 % — ABNORMAL HIGH (ref 11.5–15.5)
WBC Count: 6 10*3/uL (ref 4.0–10.5)
nRBC: 0 % (ref 0.0–0.2)

## 2020-01-22 MED ORDER — DARBEPOETIN ALFA 300 MCG/0.6ML IJ SOSY
PREFILLED_SYRINGE | INTRAMUSCULAR | Status: AC
  Filled 2020-01-22: qty 0.6

## 2020-01-22 MED ORDER — DARBEPOETIN ALFA 300 MCG/0.6ML IJ SOSY
300.0000 ug | PREFILLED_SYRINGE | Freq: Once | INTRAMUSCULAR | Status: AC
  Administered 2020-01-22: 300 ug via SUBCUTANEOUS

## 2020-01-22 NOTE — Progress Notes (Signed)
Hematology and Oncology Follow Up Visit  Brittney Pace [id number] 29-Jan-1955 64 y.o. 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 She is feeling fatigued.  Hgb is 9.1, MCV 85, WBC count 6.0 an platelets 272.  She has not noted any blood loss. No anormal bruising or petechiae.  No fever, chills, n/v, cough, rash, dizziness, chest pain, palpitations, abdominal pain or changes in bowel or bladder habits.  The neuropathy in her hands and feet that waxes and wanes.  She has intermittent swelling in her hands and feet. None noted at this time. Pedal pulses are 2+.  No falls or syncopal episodes to report.   She states that she has a good appetite and she is making a conscious effort to stay well hydrated. Her weight is stable at 98 lbs.   ECOG Performance Status: 1 - Symptomatic but completely ambulatory  Medications:  Allergies as of 01/22/2020      Reactions   Morphine Anaphylaxis, Shortness Of Breath   Morphine And Related Shortness Of Breath   Penicillins Hives   Has patient had a PCN reaction causing immediate rash, facial/tongue/throat swelling, SOB or lightheadedness with hypotension: Yes Has patient had a PCN reaction causing severe rash involving mucus membranes or skin necrosis: No Has patient had a PCN reaction that required hospitalization No Has patient had a PCN reaction occurring within the last 10 years: No If all of the above answers are "NO", then may proceed with Cephalosporin use.   Tramadol Anaphylaxis   Tramadol-acetaminophen Other (See Comments)   Small vessel heart attack Other reaction(s): Cardiovascular Arrest (ALLERGY/intolerance), Other (See Comments)   Acetaminophen Other (See Comments)   Alters insulin pump  readings   Amitriptyline Other (See Comments)   Severe headache/ out of body feeling   Codeine Other (See Comments)   Severe headaches/ out of body feeling   Ibuprofen Other (See Comments)   Messes up CGM reading on glucose monitor   Losartan Cough   Propoxyphene Other (See Comments)   Severe headaches / out of body feeling   Shellfish Allergy Diarrhea, Nausea And Vomiting   Statins Other (See Comments)   Muscle pains Other reaction(s): Other   Sulfamethoxazole Other (See Comments)   Mouth ulcers   Trichophyton Itching, Other (See Comments)   (fungus) sinusitis      Medication List       Accurate as of January 22, 2020  1:30 PM. If you have any questions, ask your nurse or doctor.        AMBULATORY NON FORMULARY MEDICATION Incontinence pads per patient preference, #unlimited, refill x99 Fax to [phone]   arginine 500 MG tablet Take 500 mg by mouth as directed.   Armodafinil 250 MG tablet Take 1 tablet (250 mg total) by mouth daily. OR can take 0.5 tablet (125 mg) po bid   aspirin EC 325 MG tablet Take 1 tablet (325 mg total) by mouth daily.   BENADRYL ALLERGY PO Take by mouth.   carvedilol 12.5 MG tablet Commonly known as: COREG Take 1 tablet (12.5 mg total) by mouth 2 (two) times daily with a meal.   Creon 3000-9500 units Cpep Generic drug: Pancrelipase (Lip-Prot-Amyl) Take by mouth.   cyclobenzaprine 10 MG tablet Commonly known as: FLEXERIL Take 0.5-1 tablets (5-10 mg total) by mouth  3 (three) times daily as needed for muscle spasms. Caution: can cause drowsiness   Dexcom G6 Sensor Misc USE TO CONTINUOUSLY MONITOR BLOOD SUGARS. CHANGE EVERY 10 DAYS   diphenoxylate-atropine 2.5-0.025 MG tablet Commonly known as: LOMOTIL TAKE 1 TABLET BY MOUTH 4 TIMES DAILY AS NEEDED FOR  DIARRHEA  OR  LOOSE  STOOLS   ELDERBERRY PO Take by mouth.   gabapentin 100 MG capsule Commonly known as: NEURONTIN Take 1-2 capsules (100-200 mg total) by mouth 3 (three)  times daily as needed.   insulin aspart 100 UNIT/ML injection Commonly known as: novoLOG USE VIA INSULIN PUMP TOTAL DAILY INSULIN 40 UNITS   insulin pump Soln Inject into the skin continuous. Novolog insulin   ketoconazole 2 % shampoo Commonly known as: NIZORAL APPLY 1 APPLICATION TOPICALLY 2 (TWO) TIMES A WEEK.   lidocaine-prilocaine cream Commonly known as: EMLA Apply 1 application topically every 2 (two) hours as needed. Please run with good Rx discount coupon   Linoleic Acid Conjugated 1000 MG Caps Take 1,000 mg by mouth daily.   Melatonin 5 MG Chew Chew 1 tablet by mouth at bedtime.   NEXIUM PO Take 20 mg by mouth every morning.   nitroGLYCERIN 0.4 MG/SPRAY spray Commonly known as: NITROLINGUAL Place 1 spray under the tongue every 5 (five) minutes x 3 doses as needed for chest pain.   NON FORMULARY Chloroella   OVER THE COUNTER MEDICATION as directed. Magnesium L-Treonate   Premarin vaginal cream Generic drug: conjugated estrogens Place 1 Applicatorful vaginally daily for 7 days, THEN 1 Applicatorful 2 (two) times a week. Start taking on: January 20, 2020   spironolactone 25 MG tablet Commonly known as: ALDACTONE Take by mouth.   thyroid 60 MG tablet Commonly known as: Armour Thyroid Take 1 tablet (60 mg total) by mouth daily before breakfast.   torsemide 20 MG tablet Commonly known as: [name] TAKE 1 TABLET ONCE DAILY INTHE MORNING. MAY TAKE AN   EXTRA 1/2 TABLET AS        NEEDED What changed: See the new instructions.   Vitamin D3 125 MCG (5000 UT) Tabs Take 1,000 Units by mouth daily.       Allergies:  Allergies  Allergen Reactions  . Morphine Anaphylaxis and Shortness Of Breath  . Morphine And Related Shortness Of Breath  . Penicillins Hives    Has patient had a PCN reaction causing immediate rash, facial/tongue/throat swelling, SOB or lightheadedness with hypotension: Yes Has patient had a PCN reaction causing severe rash involving mucus  membranes or skin necrosis: No Has patient had a PCN reaction that required hospitalization No Has patient had a PCN reaction occurring within the last 10 years: No If all of the above answers are "NO", then may proceed with Cephalosporin use.  . Tramadol Anaphylaxis  . Tramadol-Acetaminophen Other (See Comments)    Small vessel heart attack Other reaction(s): Cardiovascular Arrest (ALLERGY/intolerance), Other (See Comments)   . Acetaminophen Other (See Comments)    Alters insulin pump readings   . Amitriptyline Other (See Comments)    Severe headache/ out of body feeling  . Codeine Other (See Comments)    Severe headaches/ out of body feeling   . Ibuprofen Other (See Comments)    Messes up CGM reading on glucose monitor    . Losartan Cough  . Propoxyphene Other (See Comments)    Severe headaches / out of body feeling   . Shellfish Allergy Diarrhea and Nausea And Vomiting  . Statins Other (See Comments)  Muscle pains Other reaction(s): Other  . Sulfamethoxazole Other (See Comments)    Mouth ulcers   . Trichophyton Itching and Other (See Comments)    (fungus) sinusitis     Past Medical History, Surgical history, Social history, and Family History were reviewed and updated.  Review of Systems: All other 10 point review of systems is negative.   Physical Exam:  vitals were not taken for this visit.   Wt Readings from Last 3 Encounters:  01/02/20 97 lb 12.8 oz (44.4 kg)  12/13/19 92 lb (41.7 kg)  12/03/19 93 lb 1.9 oz (42.2 kg)    Ocular: Sclerae unicteric, pupils equal, round and reactive to light Ear-nose-throat: Oropharynx clear, dentition fair Lymphatic: No cervical or supraclavicular adenopathy Lungs no rales or rhonchi, good excursion bilaterally Heart regular rate and rhythm, no murmur appreciated Abd soft, nontender, positive bowel sounds MSK no focal spinal tenderness, no joint edema Neuro: non-focal, well-oriented, appropriate affect Breasts:  Deferred   Lab Results  Component Value Date   WBC 6.0 01/22/2020   HGB 9.1 (L) 01/22/2020   HCT 28.6 (L) 01/22/2020   MCV 85.9 01/22/2020   PLT 272 01/22/2020   Lab Results  Component Value Date   FERRITIN 59 01/02/2020   IRON 133 01/02/2020   TIBC 305 01/02/2020   UIBC 172 01/02/2020   IRONPCTSAT 44 01/02/2020   Lab Results  Component Value Date   RETICCTPCT 0.8 01/22/2020   RBC 3.35 (L) 01/22/2020   No results found for: KPAFRELGTCHN, LAMBDASER, KAPLAMBRATIO No results found for: IGGSERUM, IGA, IGMSERUM No results found for: Kathrynn Ducking, MSPIKE, SPEI   Chemistry      Component Value Date/Time   NA 130 (L) 01/02/2020 1508   NA 138 07/17/2017 1128   K 4.3 01/02/2020 1508   CL 96 (L) 01/02/2020 1508   CO2 25 01/02/2020 1508   BUN 28 (H) 01/02/2020 1508   BUN 23 07/17/2017 1128   CREATININE 1.09 (H) 01/02/2020 1508   CREATININE 0.99 07/20/2018 0821      Component Value Date/Time   CALCIUM 9.5 01/02/2020 1508   ALKPHOS 60 01/02/2020 1508   AST 24 01/02/2020 1508   ALT 15 01/02/2020 1508   BILITOT 0.4 01/02/2020 1508       Impression and Plan: Brittney Pace is a very pleasant 64 yo female with multifactorial anemia. She received Aranesp today for Hgb 9.1.  Iron studies are pending. We will replace if needed.  Follow-up in 3 weeks.  She can contact our office with any questions or concerns.   Laverna Peace, NP 12/29/20211:30 PM

## 2020-01-22 NOTE — Patient Instructions (Signed)
Darbepoetin Alfa injection What is this medicine? DARBEPOETIN ALFA (dar be POE e tin AL fa) helps your body make more red blood cells. It is used to treat anemia caused by chronic kidney failure and chemotherapy. This medicine may be used for other purposes; ask your health care provider or pharmacist if you have questions. COMMON BRAND NAME(S): Aranesp What should I tell my health care provider before I take this medicine? They need to know if you have any of these conditions:  blood clotting disorders or history of blood clots  cancer patient not on chemotherapy  cystic fibrosis  heart disease, such as angina, heart failure, or a history of a heart attack  hemoglobin level of 12 g/dL or greater  high blood pressure  low levels of folate, iron, or vitamin B12  seizures  an unusual or allergic reaction to darbepoetin, erythropoietin, albumin, hamster proteins, latex, other medicines, foods, dyes, or preservatives  pregnant or trying to get pregnant  breast-feeding How should I use this medicine? This medicine is for injection into a vein or under the skin. It is usually given by a health care professional in a hospital or clinic setting. If you get this medicine at home, you will be taught how to prepare and give this medicine. Use exactly as directed. Take your medicine at regular intervals. Do not take your medicine more often than directed. It is important that you put your used needles and syringes in a special sharps container. Do not put them in a trash can. If you do not have a sharps container, call your pharmacist or healthcare provider to get one. A special MedGuide will be given to you by the pharmacist with each prescription and refill. Be sure to read this information carefully each time. Talk to your pediatrician regarding the use of this medicine in children. While this medicine may be used in children as young as 1 month of age for selected conditions, precautions do  apply. Overdosage: If you think you have taken too much of this medicine contact a poison control center or emergency room at once. NOTE: This medicine is only for you. Do not share this medicine with others. What if I miss a dose? If you miss a dose, take it as soon as you can. If it is almost time for your next dose, take only that dose. Do not take double or extra doses. What may interact with this medicine? Do not take this medicine with any of the following medications:  epoetin alfa This list may not describe all possible interactions. Give your health care provider a list of all the medicines, herbs, non-prescription drugs, or dietary supplements you use. Also tell them if you smoke, drink alcohol, or use illegal drugs. Some items may interact with your medicine. What should I watch for while using this medicine? Your condition will be monitored carefully while you are receiving this medicine. You may need blood work done while you are taking this medicine. This medicine may cause a decrease in vitamin B6. You should make sure that you get enough vitamin B6 while you are taking this medicine. Discuss the foods you eat and the vitamins you take with your health care professional. What side effects may I notice from receiving this medicine? Side effects that you should report to your doctor or health care professional as soon as possible:  allergic reactions like skin rash, itching or hives, swelling of the face, lips, or tongue  breathing problems  changes in   vision  chest pain  confusion, trouble speaking or understanding  feeling faint or lightheaded, falls  high blood pressure  muscle aches or pains  pain, swelling, warmth in the leg  rapid weight gain  severe headaches  sudden numbness or weakness of the face, arm or leg  trouble walking, dizziness, loss of balance or coordination  seizures (convulsions)  swelling of the ankles, feet, hands  unusually weak or  tired Side effects that usually do not require medical attention (report to your doctor or health care professional if they continue or are bothersome):  diarrhea  fever, chills (flu-like symptoms)  headaches  nausea, vomiting  redness, stinging, or swelling at site where injected This list may not describe all possible side effects. Call your doctor for medical advice about side effects. You may report side effects to FDA at 1-800-FDA-1088. Where should I keep my medicine? Keep out of the reach of children. Store in a refrigerator between 2 and 8 degrees C (36 and 46 degrees F). Do not freeze. Do not shake. Throw away any unused portion if using a single-dose vial. Throw away any unused medicine after the expiration date. NOTE: This sheet is a summary. It may not cover all possible information. If you have questions about this medicine, talk to your doctor, pharmacist, or health care provider.  2020 [employer]vier/Gold Standard (2017-01-25 16:44:20)  

## 2020-01-22 NOTE — Telephone Encounter (Signed)
appts made and pt will view on mychart-req not to print,,,,aom

## 2020-01-23 ENCOUNTER — Telehealth: Payer: Self-pay

## 2020-01-23 LAB — IRON AND TIBC
Iron: 79 ug/dL (ref 41–142)
Saturation Ratios: 30 % (ref 21–57)
TIBC: 261 ug/dL (ref 236–444)
UIBC: 182 ug/dL (ref 120–384)

## 2020-01-23 LAB — FERRITIN: Ferritin: 71 ng/mL (ref 11–307)

## 2020-01-23 NOTE — Telephone Encounter (Signed)
Per pt. Prior auth. required for estrogen cream.

## 2020-01-31 NOTE — Telephone Encounter (Signed)
Prior Authorization for Premarin vaginal cream submitted via covermymeds. Awaiting response.  Your information has been submitted to [place] [employer]. [employer] will review the request and will issue a decision, typically within 1-3 days from your submission. You can check the updated outcome later by reopening this request.  If [employer] has not responded in 1-3 days or if you have any questions about your ePA request, please contact [place] [employer] at [phone]. If you think there may be a problem with your PA request, use our live chat feature at the bottom right.

## 2020-02-03 ENCOUNTER — Other Ambulatory Visit: Payer: Self-pay | Admitting: Family

## 2020-02-11 ENCOUNTER — Encounter: Payer: Self-pay | Admitting: Osteopathic Medicine

## 2020-02-11 NOTE — Telephone Encounter (Signed)
Pt needs to call insurance and ask what vaginal estrogen is approved / on formulary

## 2020-02-11 NOTE — Telephone Encounter (Signed)
Pt left a vm msg stating she received notification regarding insurance denial for premarin rx. Per pt, she needs to get something to help her with the atrophy and pelvic floor issues. Requesting an alternative rx if possible. Pls advise, thanks.

## 2020-02-11 NOTE — Telephone Encounter (Signed)
Per pt, insurance will cover generic estradiol cream.

## 2020-02-11 NOTE — Telephone Encounter (Signed)
Pt has been updated and is agreeable with provider's recommendation. Pt will contact her insurance to see which estrogen cream is required. She will contact the clinic with an update.

## 2020-02-12 ENCOUNTER — Inpatient Hospital Stay: Payer: 59 | Admitting: Family

## 2020-02-12 ENCOUNTER — Inpatient Hospital Stay: Payer: 59

## 2020-02-13 MED ORDER — ESTRADIOL 0.1 MG/GM VA CREA: TOPICAL_CREAM | VAGINAL | 12 refills | Status: AC

## 2020-02-24 ENCOUNTER — Inpatient Hospital Stay: Payer: 59

## 2020-02-24 ENCOUNTER — Inpatient Hospital Stay: Payer: 59 | Admitting: Family

## 2020-02-27 ENCOUNTER — Telehealth: Payer: Self-pay

## 2020-02-27 NOTE — Telephone Encounter (Signed)
Pt called to r/s her cancelled 02/24/20 appt

## 2020-03-04 ENCOUNTER — Encounter: Payer: Self-pay | Admitting: Family

## 2020-03-04 ENCOUNTER — Inpatient Hospital Stay: Payer: No Typology Code available for payment source | Attending: Hematology & Oncology

## 2020-03-04 ENCOUNTER — Telehealth: Payer: Self-pay

## 2020-03-04 ENCOUNTER — Inpatient Hospital Stay: Payer: No Typology Code available for payment source

## 2020-03-04 ENCOUNTER — Other Ambulatory Visit: Payer: Self-pay

## 2020-03-04 ENCOUNTER — Inpatient Hospital Stay (HOSPITAL_BASED_OUTPATIENT_CLINIC_OR_DEPARTMENT_OTHER): Payer: No Typology Code available for payment source | Admitting: Family

## 2020-03-04 VITALS — BP 154/68 | HR 58 | Temp 97.9°F | Resp 18 | Ht 60.0 in | Wt 100.8 lb

## 2020-03-04 DIAGNOSIS — N189 Chronic kidney disease, unspecified: Secondary | ICD-10-CM | POA: Diagnosis not present

## 2020-03-04 DIAGNOSIS — D631 Anemia in chronic kidney disease: Secondary | ICD-10-CM | POA: Diagnosis present

## 2020-03-04 DIAGNOSIS — N1831 Chronic kidney disease, stage 3a: Secondary | ICD-10-CM

## 2020-03-04 DIAGNOSIS — D638 Anemia in other chronic diseases classified elsewhere: Secondary | ICD-10-CM

## 2020-03-04 DIAGNOSIS — N183 Chronic kidney disease, stage 3 unspecified: Secondary | ICD-10-CM

## 2020-03-04 DIAGNOSIS — D508 Other iron deficiency anemias: Secondary | ICD-10-CM | POA: Diagnosis not present

## 2020-03-04 DIAGNOSIS — E1022 Type 1 diabetes mellitus with diabetic chronic kidney disease: Secondary | ICD-10-CM

## 2020-03-04 LAB — CMP (CANCER CENTER ONLY)
ALT: 11 U/L (ref 0–44)
AST: 19 U/L (ref 15–41)
Albumin: 3.8 g/dL (ref 3.5–5.0)
Alkaline Phosphatase: 69 U/L (ref 38–126)
Anion gap: 7 (ref 5–15)
BUN: 30 mg/dL — ABNORMAL HIGH (ref 8–23)
CO2: 28 mmol/L (ref 22–32)
Calcium: 9.4 mg/dL (ref 8.9–10.3)
Chloride: 97 mmol/L — ABNORMAL LOW (ref 98–111)
Creatinine: 1.47 mg/dL — ABNORMAL HIGH (ref 0.44–1.00)
GFR, Estimated: 39 mL/min — ABNORMAL LOW (ref >60)
Glucose, Bld: 69 mg/dL — ABNORMAL LOW (ref 70–99)
Potassium: 5.1 mmol/L (ref 3.5–5.1)
Sodium: 132 mmol/L — ABNORMAL LOW (ref 135–145)
Total Bilirubin: 0.3 mg/dL (ref 0.3–1.2)
Total Protein: 6 g/dL — ABNORMAL LOW (ref 6.5–8.1)

## 2020-03-04 LAB — CBC WITH DIFFERENTIAL (CANCER CENTER ONLY)
Abs Immature Granulocytes: 0.02 10*3/uL (ref 0.00–0.07)
Basophils Absolute: 0.1 10*3/uL (ref 0.0–0.1)
Basophils Relative: 1 %
Eosinophils Absolute: 0.3 10*3/uL (ref 0.0–0.5)
Eosinophils Relative: 4 %
HCT: 29.1 % — ABNORMAL LOW (ref 36.0–46.0)
Hemoglobin: 9.4 g/dL — ABNORMAL LOW (ref 12.0–15.0)
Immature Granulocytes: 0 %
Lymphocytes Relative: 22 %
Lymphs Abs: 1.6 10*3/uL (ref 0.7–4.0)
MCH: 28.1 pg (ref 26.0–34.0)
MCHC: 32.3 g/dL (ref 30.0–36.0)
MCV: 87.1 fL (ref 80.0–100.0)
Monocytes Absolute: 0.8 10*3/uL (ref 0.1–1.0)
Monocytes Relative: 12 %
Neutro Abs: 4.4 10*3/uL (ref 1.7–7.7)
Neutrophils Relative %: 61 %
Platelet Count: 306 10*3/uL (ref 150–400)
RBC: 3.34 MIL/uL — ABNORMAL LOW (ref 3.87–5.11)
RDW: 15.7 % — ABNORMAL HIGH (ref 11.5–15.5)
WBC Count: 7.1 10*3/uL (ref 4.0–10.5)
nRBC: 0 % (ref 0.0–0.2)

## 2020-03-04 LAB — RETICULOCYTES
Immature Retic Fract: 4.3 % (ref 2.3–15.9)
RBC.: 3.32 MIL/uL — ABNORMAL LOW (ref 3.87–5.11)
Retic Count, Absolute: 23.2 10*3/uL (ref 19.0–186.0)
Retic Ct Pct: 0.7 % (ref 0.4–3.1)

## 2020-03-04 MED ORDER — DARBEPOETIN ALFA 300 MCG/0.6ML IJ SOSY
300.0000 ug | PREFILLED_SYRINGE | Freq: Once | INTRAMUSCULAR | Status: AC
  Administered 2020-03-04: 300 ug via SUBCUTANEOUS

## 2020-03-04 MED ORDER — DARBEPOETIN ALFA 300 MCG/0.6ML IJ SOSY
PREFILLED_SYRINGE | INTRAMUSCULAR | Status: AC
  Filled 2020-03-04: qty 0.6

## 2020-03-04 NOTE — Telephone Encounter (Signed)
appts made and printed per 03/04/20 los

## 2020-03-04 NOTE — Progress Notes (Signed)
Hematology and Oncology Follow Up Visit  Brittney Pace [id number] April 28, 1955 65 y.o. 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 Her blood sugar over her monitor is in the 60's and she is symptomatic with lightheadedness.  She at a snack and blood glucose is in in the high 90's. She states that her BP is high when her glucose is low. When she first arrived BP was 181/90. After our visit and once her glucose was in the 90's, BP recheck was 154/68.  She notes fatigue.  No fever, chills, n/v, cough, rash, SOB, palpitations or changes in bowel or bladder habits.  She has IBS-D and has associated abdominal discomfort and bloating at times.  She has occasional episodes of chest discomfort and states that she has nitro spray which helps alleviate her symptoms.  She has generalized aches and pains with fibromyalgia.  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  No falls or syncope to report.  She has maintained a good appetite and is doing her best to stay well hydrated. Her weight is stable.   ECOG Performance Status: 1 - Symptomatic but completely ambulatory  Medications:  Allergies as of 03/04/2020      Reactions   Morphine Anaphylaxis, Shortness Of Breath   Morphine And Related Shortness Of Breath   Penicillins Hives   Has patient had a PCN reaction causing immediate rash, facial/tongue/throat swelling, SOB or lightheadedness with hypotension: Yes Has patient had a PCN reaction causing severe rash involving mucus membranes or skin necrosis: No Has patient had a PCN reaction that required hospitalization No Has patient had a PCN reaction occurring within the last 10 years: No If all of the above answers are "NO", then may  proceed with Cephalosporin use.   Tramadol Anaphylaxis   Tramadol-acetaminophen Other (See Comments)   Small vessel heart attack Other reaction(s): Cardiovascular Arrest (ALLERGY/intolerance), Other (See Comments)   Acetaminophen Other (See Comments)   Alters insulin pump readings   Amitriptyline Other (See Comments)   Severe headache/ out of body feeling   Codeine Other (See Comments)   Severe headaches/ out of body feeling   Ibuprofen Other (See Comments)   Messes up CGM reading on glucose monitor   Losartan Cough   Propoxyphene Other (See Comments)   Severe headaches / out of body feeling   Shellfish Allergy Diarrhea, Nausea And Vomiting   Statins Other (See Comments)   Muscle pains Other reaction(s): Other   Sulfamethoxazole Other (See Comments)   Mouth ulcers   Trichophyton Itching, Other (See Comments)   (fungus) sinusitis      Medication List       Accurate as of March 04, 2020  2:08 PM. If you have any questions, ask your nurse or doctor.        AMBULATORY NON FORMULARY MEDICATION Incontinence pads per patient preference, #unlimited, refill [phone] Fax to [phone]   arginine 500 MG tablet Take 500 mg by mouth as directed.   Armodafinil 250 MG tablet Take 1 tablet (250 mg total) by mouth daily. OR can take 0.5 tablet (125 mg) po bid   aspirin EC 325 MG tablet Take 1 tablet (325 mg total) by mouth daily.   BENADRYL ALLERGY PO Take by mouth.   carvedilol 12.5 MG tablet  Commonly known as: COREG Take 1 tablet (12.5 mg total) by mouth 2 (two) times daily with a meal.   Creon 3000-9500 units Cpep Generic drug: Pancrelipase (Lip-Prot-Amyl) Take by mouth.   cyclobenzaprine 10 MG tablet Commonly known as: FLEXERIL Take 0.5-1 tablets (5-10 mg total) by mouth 3 (three) times daily as needed for muscle spasms. Caution: can cause drowsiness   Dexcom G6 Sensor Misc USE TO CONTINUOUSLY MONITOR BLOOD SUGARS. CHANGE EVERY 10 DAYS   diphenoxylate-atropine  2.5-0.025 MG tablet Commonly known as: LOMOTIL TAKE 1 TABLET BY MOUTH 4 TIMES DAILY AS NEEDED FOR  DIARRHEA  OR  LOOSE  STOOLS   ELDERBERRY PO Take by mouth.   estradiol 0.1 MG/GM vaginal cream Commonly known as: ESTRACE VAGINAL Place 1 Applicatorful vaginally at bedtime for 7 days, THEN 1 Applicatorful 3 (three) times a week. Start taking on: February 13, 2020   gabapentin 100 MG capsule Commonly known as: NEURONTIN Take 1-2 capsules (100-200 mg total) by mouth 3 (three) times daily as needed.   insulin aspart 100 UNIT/ML injection Commonly known as: novoLOG USE VIA INSULIN PUMP TOTAL DAILY INSULIN 40 UNITS   insulin pump Soln Inject into the skin continuous. Novolog insulin   ketoconazole 2 % shampoo Commonly known as: NIZORAL APPLY 1 APPLICATION TOPICALLY 2 (TWO) TIMES A WEEK.   lidocaine-prilocaine cream Commonly known as: EMLA Apply 1 application topically every 2 (two) hours as needed. Please run with good Rx discount coupon   Linoleic Acid Conjugated 1000 MG Caps Take 1,000 mg by mouth daily.   Melatonin 5 MG Chew Chew 1 tablet by mouth at bedtime.   NEXIUM PO Take 20 mg by mouth every morning.   nitroGLYCERIN 0.4 MG/SPRAY spray Commonly known as: NITROLINGUAL Place 1 spray under the tongue every 5 (five) minutes x 3 doses as needed for chest pain.   NON FORMULARY Chloroella   OVER THE COUNTER MEDICATION as directed. Magnesium L-Treonate   spironolactone 25 MG tablet Commonly known as: ALDACTONE Take by mouth.   thyroid 60 MG tablet Commonly known as: Armour Thyroid Take 1 tablet (60 mg total) by mouth daily before breakfast.   torsemide 20 MG tablet Commonly known as: [name] TAKE 1 TABLET ONCE DAILY INTHE MORNING. MAY TAKE AN   EXTRA 1/2 TABLET AS        NEEDED What changed: See the new instructions.   Vitamin D3 125 MCG (5000 UT) Tabs Take 1,000 Units by mouth daily.       Allergies:  Allergies  Allergen Reactions  . Morphine  Anaphylaxis and Shortness Of Breath  . Morphine And Related Shortness Of Breath  . Penicillins Hives    Has patient had a PCN reaction causing immediate rash, facial/tongue/throat swelling, SOB or lightheadedness with hypotension: Yes Has patient had a PCN reaction causing severe rash involving mucus membranes or skin necrosis: No Has patient had a PCN reaction that required hospitalization No Has patient had a PCN reaction occurring within the last 10 years: No If all of the above answers are "NO", then may proceed with Cephalosporin use.  . Tramadol Anaphylaxis  . Tramadol-Acetaminophen Other (See Comments)    Small vessel heart attack Other reaction(s): Cardiovascular Arrest (ALLERGY/intolerance), Other (See Comments)   . Acetaminophen Other (See Comments)    Alters insulin pump readings   . Amitriptyline Other (See Comments)    Severe headache/ out of body feeling  . Codeine Other (See Comments)    Severe headaches/ out of body feeling   .  Ibuprofen Other (See Comments)    Messes up CGM reading on glucose monitor    . Losartan Cough  . Propoxyphene Other (See Comments)    Severe headaches / out of body feeling   . Shellfish Allergy Diarrhea and Nausea And Vomiting  . Statins Other (See Comments)    Muscle pains Other reaction(s): Other  . Sulfamethoxazole Other (See Comments)    Mouth ulcers   . Trichophyton Itching and Other (See Comments)    (fungus) sinusitis     Past Medical History, Surgical history, Social history, and Family History were reviewed and updated.  Review of Systems: All other 10 point review of systems is negative.   Physical Exam:  vitals were not taken for this visit.   Wt Readings from Last 3 Encounters:  01/22/20 98 lb 6.4 oz (44.6 kg)  01/02/20 97 lb 12.8 oz (44.4 kg)  12/13/19 92 lb (41.7 kg)    Ocular: Sclerae unicteric, pupils equal, round and reactive to light Ear-nose-throat: Oropharynx clear, dentition fair Lymphatic: No  cervical or supraclavicular adenopathy Lungs no rales or rhonchi, good excursion bilaterally Heart regular rate and rhythm, no murmur appreciated Abd soft, nontender, positive bowel sounds MSK no focal spinal tenderness, no joint edema Neuro: non-focal, well-oriented, appropriate affect Breasts: Deferred   Lab Results  Component Value Date   WBC 7.1 03/04/2020   HGB 9.4 (L) 03/04/2020   HCT 29.1 (L) 03/04/2020   MCV 87.1 03/04/2020   PLT 306 03/04/2020   Lab Results  Component Value Date   FERRITIN 71 01/22/2020   IRON 79 01/22/2020   TIBC 261 01/22/2020   UIBC 182 01/22/2020   IRONPCTSAT 30 01/22/2020   Lab Results  Component Value Date   RETICCTPCT 0.7 03/04/2020   RBC 3.32 (L) 03/04/2020   No results found for: KPAFRELGTCHN, LAMBDASER, KAPLAMBRATIO No results found for: IGGSERUM, IGA, IGMSERUM No results found for: Ronnald Ramp, A1GS, A2GS, BETS, BETA2SER, GAMS, MSPIKE, SPEI   Chemistry      Component Value Date/Time   NA 129 (L) 01/22/2020 1311   NA 138 07/17/2017 1128   K 5.1 01/22/2020 1311   CL 99 01/22/2020 1311   CO2 25 01/22/2020 1311   BUN 25 (H) 01/22/2020 1311   BUN 23 07/17/2017 1128   CREATININE 1.23 (H) 01/22/2020 1311   CREATININE 0.99 07/20/2018 0821      Component Value Date/Time   CALCIUM 9.1 01/22/2020 1311   ALKPHOS 60 01/22/2020 1311   AST 22 01/22/2020 1311   ALT 15 01/22/2020 1311   BILITOT 0.3 01/22/2020 1311       Impression and Plan: Brittney Pace is a very pleasant 65 yo female with multifactorial anemia. She received ESA for Hgb 9.4.  Iron studies are pending. We can replace if needed.  Lab and injection in 3 weeks, follow-up in 6 weeks.  She can contact our office with any questions or concerns.   Laverna Peace, NP 2/9/20222:08 PM

## 2020-03-04 NOTE — Patient Instructions (Signed)
Darbepoetin Alfa injection What is this medicine? DARBEPOETIN ALFA (dar be POE e tin AL fa) helps your body make more red blood cells. It is used to treat anemia caused by chronic kidney failure and chemotherapy. This medicine may be used for other purposes; ask your health care provider or pharmacist if you have questions. COMMON BRAND NAME(S): Aranesp What should I tell my health care provider before I take this medicine? They need to know if you have any of these conditions:  blood clotting disorders or history of blood clots  cancer patient not on chemotherapy  cystic fibrosis  heart disease, such as angina, heart failure, or a history of a heart attack  hemoglobin level of 12 g/dL or greater  high blood pressure  low levels of folate, iron, or vitamin B12  seizures  an unusual or allergic reaction to darbepoetin, erythropoietin, albumin, hamster proteins, latex, other medicines, foods, dyes, or preservatives  pregnant or trying to get pregnant  breast-feeding How should I use this medicine? This medicine is for injection into a vein or under the skin. It is usually given by a health care professional in a hospital or clinic setting. If you get this medicine at home, you will be taught how to prepare and give this medicine. Use exactly as directed. Take your medicine at regular intervals. Do not take your medicine more often than directed. It is important that you put your used needles and syringes in a special sharps container. Do not put them in a trash can. If you do not have a sharps container, call your pharmacist or healthcare provider to get one. A special MedGuide will be given to you by the pharmacist with each prescription and refill. Be sure to read this information carefully each time. Talk to your pediatrician regarding the use of this medicine in children. While this medicine may be used in children as young as 1 month of age for selected conditions, precautions do  apply. Overdosage: If you think you have taken too much of this medicine contact a poison control center or emergency room at once. NOTE: This medicine is only for you. Do not share this medicine with others. What if I miss a dose? If you miss a dose, take it as soon as you can. If it is almost time for your next dose, take only that dose. Do not take double or extra doses. What may interact with this medicine? Do not take this medicine with any of the following medications:  epoetin alfa This list may not describe all possible interactions. Give your health care provider a list of all the medicines, herbs, non-prescription drugs, or dietary supplements you use. Also tell them if you smoke, drink alcohol, or use illegal drugs. Some items may interact with your medicine. What should I watch for while using this medicine? Your condition will be monitored carefully while you are receiving this medicine. You may need blood work done while you are taking this medicine. This medicine may cause a decrease in vitamin B6. You should make sure that you get enough vitamin B6 while you are taking this medicine. Discuss the foods you eat and the vitamins you take with your health care professional. What side effects may I notice from receiving this medicine? Side effects that you should report to your doctor or health care professional as soon as possible:  allergic reactions like skin rash, itching or hives, swelling of the face, lips, or tongue  breathing problems  changes in   vision  chest pain  confusion, trouble speaking or understanding  feeling faint or lightheaded, falls  high blood pressure  muscle aches or pains  pain, swelling, warmth in the leg  rapid weight gain  severe headaches  sudden numbness or weakness of the face, arm or leg  trouble walking, dizziness, loss of balance or coordination  seizures (convulsions)  swelling of the ankles, feet, hands  unusually weak or  tired Side effects that usually do not require medical attention (report to your doctor or health care professional if they continue or are bothersome):  diarrhea  fever, chills (flu-like symptoms)  headaches  nausea, vomiting  redness, stinging, or swelling at site where injected This list may not describe all possible side effects. Call your doctor for medical advice about side effects. You may report side effects to FDA at 1-800-FDA-1088. Where should I keep my medicine? Keep out of the reach of children. Store in a refrigerator between 2 and 8 degrees C (36 and 46 degrees F). Do not freeze. Do not shake. Throw away any unused portion if using a single-dose vial. Throw away any unused medicine after the expiration date. NOTE: This sheet is a summary. It may not cover all possible information. If you have questions about this medicine, talk to your doctor, pharmacist, or health care provider.  2021 [employer] Standard (2017-01-25 16:44:20)  

## 2020-03-05 ENCOUNTER — Other Ambulatory Visit: Payer: Self-pay | Admitting: Osteopathic Medicine

## 2020-03-05 LAB — IRON AND TIBC
Iron: 70 ug/dL (ref 41–142)
Saturation Ratios: 25 % (ref 21–57)
TIBC: 276 ug/dL (ref 236–444)
UIBC: 207 ug/dL (ref 120–384)

## 2020-03-05 LAB — FERRITIN: Ferritin: 72 ng/mL (ref 11–307)

## 2020-03-11 ENCOUNTER — Inpatient Hospital Stay: Payer: No Typology Code available for payment source

## 2020-03-11 ENCOUNTER — Inpatient Hospital Stay: Payer: No Typology Code available for payment source | Admitting: Family

## 2020-03-24 ENCOUNTER — Inpatient Hospital Stay: Payer: No Typology Code available for payment source

## 2020-03-24 ENCOUNTER — Other Ambulatory Visit: Payer: Self-pay

## 2020-03-24 ENCOUNTER — Inpatient Hospital Stay: Payer: No Typology Code available for payment source | Attending: Hematology & Oncology

## 2020-03-24 VITALS — BP 172/71 | HR 65 | Temp 98.1°F | Resp 17

## 2020-03-24 DIAGNOSIS — N183 Chronic kidney disease, stage 3 unspecified: Secondary | ICD-10-CM

## 2020-03-24 DIAGNOSIS — Z79899 Other long term (current) drug therapy: Secondary | ICD-10-CM | POA: Insufficient documentation

## 2020-03-24 DIAGNOSIS — D631 Anemia in chronic kidney disease: Secondary | ICD-10-CM | POA: Diagnosis present

## 2020-03-24 DIAGNOSIS — D508 Other iron deficiency anemias: Secondary | ICD-10-CM

## 2020-03-24 DIAGNOSIS — Z8673 Personal history of transient ischemic attack (TIA), and cerebral infarction without residual deficits: Secondary | ICD-10-CM | POA: Diagnosis not present

## 2020-03-24 DIAGNOSIS — N1831 Chronic kidney disease, stage 3a: Secondary | ICD-10-CM

## 2020-03-24 DIAGNOSIS — Z794 Long term (current) use of insulin: Secondary | ICD-10-CM | POA: Insufficient documentation

## 2020-03-24 DIAGNOSIS — E1022 Type 1 diabetes mellitus with diabetic chronic kidney disease: Secondary | ICD-10-CM

## 2020-03-24 DIAGNOSIS — N189 Chronic kidney disease, unspecified: Secondary | ICD-10-CM | POA: Diagnosis not present

## 2020-03-24 DIAGNOSIS — E11649 Type 2 diabetes mellitus with hypoglycemia without coma: Secondary | ICD-10-CM | POA: Diagnosis not present

## 2020-03-24 DIAGNOSIS — D638 Anemia in other chronic diseases classified elsewhere: Secondary | ICD-10-CM

## 2020-03-24 LAB — CBC WITH DIFFERENTIAL (CANCER CENTER ONLY)
Abs Immature Granulocytes: 0.01 10*3/uL (ref 0.00–0.07)
Basophils Absolute: 0.1 10*3/uL (ref 0.0–0.1)
Basophils Relative: 1 %
Eosinophils Absolute: 0.3 10*3/uL (ref 0.0–0.5)
Eosinophils Relative: 4 %
HCT: 31.7 % — ABNORMAL LOW (ref 36.0–46.0)
Hemoglobin: 9.9 g/dL — ABNORMAL LOW (ref 12.0–15.0)
Immature Granulocytes: 0 %
Lymphocytes Relative: 20 %
Lymphs Abs: 1.3 10*3/uL (ref 0.7–4.0)
MCH: 27.5 pg (ref 26.0–34.0)
MCHC: 31.2 g/dL (ref 30.0–36.0)
MCV: 88.1 fL (ref 80.0–100.0)
Monocytes Absolute: 0.8 10*3/uL (ref 0.1–1.0)
Monocytes Relative: 13 %
Neutro Abs: 4.2 10*3/uL (ref 1.7–7.7)
Neutrophils Relative %: 62 %
Platelet Count: 338 10*3/uL (ref 150–400)
RBC: 3.6 MIL/uL — ABNORMAL LOW (ref 3.87–5.11)
RDW: 14.7 % (ref 11.5–15.5)
WBC Count: 6.7 10*3/uL (ref 4.0–10.5)
nRBC: 0 % (ref 0.0–0.2)

## 2020-03-24 LAB — CMP (CANCER CENTER ONLY)
ALT: 15 U/L (ref 0–44)
AST: 29 U/L (ref 15–41)
Albumin: 3.8 g/dL (ref 3.5–5.0)
Alkaline Phosphatase: 81 U/L (ref 38–126)
Anion gap: 5 (ref 5–15)
BUN: 25 mg/dL — ABNORMAL HIGH (ref 8–23)
CO2: 26 mmol/L (ref 22–32)
Calcium: 9.2 mg/dL (ref 8.9–10.3)
Chloride: 99 mmol/L (ref 98–111)
Creatinine: 1.08 mg/dL — ABNORMAL HIGH (ref 0.44–1.00)
GFR, Estimated: 57 mL/min — ABNORMAL LOW (ref >60)
Glucose, Bld: 142 mg/dL — ABNORMAL HIGH (ref 70–99)
Potassium: 4.9 mmol/L (ref 3.5–5.1)
Sodium: 130 mmol/L — ABNORMAL LOW (ref 135–145)
Total Bilirubin: 0.4 mg/dL (ref 0.3–1.2)
Total Protein: 6.2 g/dL — ABNORMAL LOW (ref 6.5–8.1)

## 2020-03-24 MED ORDER — DARBEPOETIN ALFA 300 MCG/0.6ML IJ SOSY
300.0000 ug | PREFILLED_SYRINGE | Freq: Once | INTRAMUSCULAR | Status: AC
  Administered 2020-03-24: 300 ug via SUBCUTANEOUS

## 2020-03-24 MED ORDER — DARBEPOETIN ALFA 300 MCG/0.6ML IJ SOSY
PREFILLED_SYRINGE | INTRAMUSCULAR | Status: AC
  Filled 2020-03-24: qty 0.6

## 2020-03-24 NOTE — Patient Instructions (Signed)
Darbepoetin Alfa injection What is this medicine? DARBEPOETIN ALFA (dar be POE e tin AL fa) helps your body make more red blood cells. It is used to treat anemia caused by chronic kidney failure and chemotherapy. This medicine may be used for other purposes; ask your health care provider or pharmacist if you have questions. COMMON BRAND NAME(S): Aranesp What should I tell my health care provider before I take this medicine? They need to know if you have any of these conditions:  blood clotting disorders or history of blood clots  cancer patient not on chemotherapy  cystic fibrosis  heart disease, such as angina, heart failure, or a history of a heart attack  hemoglobin level of 12 g/dL or greater  high blood pressure  low levels of folate, iron, or vitamin B12  seizures  an unusual or allergic reaction to darbepoetin, erythropoietin, albumin, hamster proteins, latex, other medicines, foods, dyes, or preservatives  pregnant or trying to get pregnant  breast-feeding How should I use this medicine? This medicine is for injection into a vein or under the skin. It is usually given by a health care professional in a hospital or clinic setting. If you get this medicine at home, you will be taught how to prepare and give this medicine. Use exactly as directed. Take your medicine at regular intervals. Do not take your medicine more often than directed. It is important that you put your used needles and syringes in a special sharps container. Do not put them in a trash can. If you do not have a sharps container, call your pharmacist or healthcare provider to get one. A special MedGuide will be given to you by the pharmacist with each prescription and refill. Be sure to read this information carefully each time. Talk to your pediatrician regarding the use of this medicine in children. While this medicine may be used in children as young as 1 month of age for selected conditions, precautions do  apply. Overdosage: If you think you have taken too much of this medicine contact a poison control center or emergency room at once. NOTE: This medicine is only for you. Do not share this medicine with others. What if I miss a dose? If you miss a dose, take it as soon as you can. If it is almost time for your next dose, take only that dose. Do not take double or extra doses. What may interact with this medicine? Do not take this medicine with any of the following medications:  epoetin alfa This list may not describe all possible interactions. Give your health care provider a list of all the medicines, herbs, non-prescription drugs, or dietary supplements you use. Also tell them if you smoke, drink alcohol, or use illegal drugs. Some items may interact with your medicine. What should I watch for while using this medicine? Your condition will be monitored carefully while you are receiving this medicine. You may need blood work done while you are taking this medicine. This medicine may cause a decrease in vitamin B6. You should make sure that you get enough vitamin B6 while you are taking this medicine. Discuss the foods you eat and the vitamins you take with your health care professional. What side effects may I notice from receiving this medicine? Side effects that you should report to your doctor or health care professional as soon as possible:  allergic reactions like skin rash, itching or hives, swelling of the face, lips, or tongue  breathing problems  changes in   vision  chest pain  confusion, trouble speaking or understanding  feeling faint or lightheaded, falls  high blood pressure  muscle aches or pains  pain, swelling, warmth in the leg  rapid weight gain  severe headaches  sudden numbness or weakness of the face, arm or leg  trouble walking, dizziness, loss of balance or coordination  seizures (convulsions)  swelling of the ankles, feet, hands  unusually weak or  tired Side effects that usually do not require medical attention (report to your doctor or health care professional if they continue or are bothersome):  diarrhea  fever, chills (flu-like symptoms)  headaches  nausea, vomiting  redness, stinging, or swelling at site where injected This list may not describe all possible side effects. Call your doctor for medical advice about side effects. You may report side effects to FDA at 1-800-FDA-1088. Where should I keep my medicine? Keep out of the reach of children. Store in a refrigerator between 2 and 8 degrees C (36 and 46 degrees F). Do not freeze. Do not shake. Throw away any unused portion if using a single-dose vial. Throw away any unused medicine after the expiration date. NOTE: This sheet is a summary. It may not cover all possible information. If you have questions about this medicine, talk to your doctor, pharmacist, or health care provider.  2021 [employer] Standard (2017-01-25 16:44:20)  

## 2020-03-25 ENCOUNTER — Inpatient Hospital Stay: Payer: No Typology Code available for payment source

## 2020-03-31 NOTE — Progress Notes (Signed)
[facility]    Provider:  Dr Jaynee Eagles Requesting Provider: Emeterio Reeve, DO Primary Care Provider:  Emeterio Reeve, DO  CC:  Headaches  HPI:  Brittney Pace is a 65 y.o. female here as requested by Emeterio Reeve, DO for migraines.  This is a patient with an extremely complicated past medical history including coronary artery disease, cerebral infarction, heart failure, hypertension, asthma, IBS, tardive dyskinesias, abnormal MRI of the brain with moderate to advanced white matter changes and atrophy, diabetes type 1 with multiple complications, hypothyroidism, CKD, chronic neck pain, neuropathy, degenerative disc disease multiple levels of the spine, anemia, history of Lyme disease, depression, memory loss, chronic headaches and migraines, dizziness, multiple strokes.  She is here with her husband who also provides information.  Very difficult and extended visit, patient is tangential and difficult to redirect but very pleasant.  She has a long history of migraines with pulsating pounding throbbing, unilateral, light sensitivity, nausea, worsening recently over the last year the headaches are daily. Her blood pressures are elevated. She had lyme disease and babesia in 2009. She was treated for this 5 years later. She has had headaches daily for over a year, after the stroke she started getting headaches. She does not wake up with them.  They do not wake her up. She has a headache now, on the left side above and around the eye, tightening, mostly pressure, They do not last all day long. Sound doesn't bother her. Very tangential. 8/10 every day. Her husband is here, caffeine seems to help. Very tangential. Poor historian. Gabapentin helps. She gets dizziness with the gabapentin so she only takes it at night. No significant snoring. No aura. No medication overuse. Ongoing for years at this frequency and severity however not positional and no vision changes or other red  flags. No other focal neurologic deficits, associated symptoms, inciting events or modifiable factors.  Reviewed notes, labs and imaging from outside physicians, which showed:  MRI brain 02/08/2016: Personally reviewed images and agree  IMPRESSION:  This MRI of the brain without contrast shows the following: 1.    Moderately severe cortical atrophy 2.    Mild chronic microvascular ischemic changes and evidence of small chronic lacunar infarctions in the pons 3.    Mild maxillary chronic sinusitis. 4.    There are no acute findings.  From a thorough review of records, medications tried that can be used in migraine management include: Amlodipine, aspirin, carvedilol, Flexeril, Prozac, gabapentin, melatonin, magnesium citrate, Robaxin, Zofran, Phenergan, Topamax, amitriptyline, tramadol,   Review of Systems: Patient complains of symptoms per HPI as well as the following symptoms tardive dyskinesias, headache. Pertinent negatives and positives per HPI. All others negative.   Social History   Socioeconomic History  . Marital status: Married    Spouse name: Not on file  . Number of children: 0  . Years of education: college  . Highest education level: Not on file  Occupational History  . Occupation: Retired  Tobacco Use  . Smoking status: Former Smoker    Types: Cigarettes  . Smokeless tobacco: Never Used  . Tobacco comment:  " Quit smoking cigarettes in 20's "  Vaping Use  . Vaping Use: Never used  Substance and Sexual Activity  . Alcohol use: Yes    Comment: occasional beer or wine  . Drug use: No  . Sexual activity: Not on file  Other Topics Concern  . Not on file  Social History Narrative   Drinks 1-2  cups caffeine  drinks a day    Right handed   Lives at home with husband and dog   Social Determinants of Health   Financial Resource Strain: Not on file  Food Insecurity: Not on file  Transportation Needs: Not on file  Physical Activity: Not on file  Stress: Not on  file  Social Connections: Not on file  Intimate Partner Violence: Not on file    Family History  Problem Relation Age of Onset  . Breast cancer Mother   . Migraines Mother   . Heart disease Father   . Hypertension Father   . Breast cancer Sister     Past Medical History:  Diagnosis Date  . Anemia   . Arthritis   . 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 Chronic kidney disease    stage 3  . Coronary artery disease   . Depression   . Diabetes mellitus without complication (HCC)    Type 1  . Diabetic retinopathy ([place])   . 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 Family history of adverse reaction to anesthesia    mother: " while she was under she stopped breathing."  . Fibromyalgia   . Gastroparesis   . GERD (gastroesophageal reflux disease)   . Headache    migraines  . Hypothyroidism   . IBS (irritable bowel syndrome)   . Idiopathic edema   . Iron deficiency anemia 09/04/2019  . Lyme disease   . Mitral valve prolapse   . Myocardial infarction ([place])    1 major in 1999 and 2 minor " small vessel disease."  . Osteoporosis   . Peripheral neuropathy   . Peripheral vascular disease ([place])   . 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 Stroke [facility])    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" " the second stroke was a lacunar     Patient Active Problem List   Diagnosis Date Noted  . Chronic migraine without aura, with intractable migraine, so stated, with status migrainosus 04/01/2020  . Herpes genitalis 11/07/2019  . History of multiple cerebrovascular accidents (CVAs) 10/30/2019  . Iron deficiency anemia 09/04/2019  . ETD (Eustachian tube dysfunction), bilateral 08/22/2019  . History of hyperbaric oxygen therapy 08/22/2019  . DNR (do not resuscitate) 12/10/2018  . OAB (overactive bladder) 11/20/2018  . Recurrent UTI 11/20/2018  . Vaginal atrophy 11/20/2018  . Voiding dysfunction 11/20/2018  . Dizziness 10/22/2018  . Ear fullness,  bilateral 10/22/2018  . Tinnitus, right 10/22/2018  . 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 Arthritis 02/23/2018  . Asthma 02/23/2018  . Carpal tunnel syndrome 02/23/2018  . Gastroesophageal reflux disease 02/23/2018  . Kidney disease 02/23/2018  . Seasonal affective disorder ([place]) 02/23/2018  . Electrolyte abnormality 02/20/2017  . Sepsis secondary to UTI ([place]) 02/15/2017  . Sepsis with metabolic encephalopathy ([place]) 02/15/2017  . Hypertension associated with diabetes ([place]) 02/14/2017  . Encephalopathy acute 12/23/2016  . Brain atrophy ([place]) 05/24/2016  . Chronic headache 05/24/2016  . Postmenopausal osteoporosis 05/03/2016  . Neuropathy 04/20/2016  . Lacunar stroke ([place]) 01/20/2016  . Memory loss 01/20/2016  . Other fatigue 01/20/2016  . CAD (coronary artery disease) of artery bypass graft 01/20/2016  . Depression 01/20/2016  . Right lateral epicondylitis 12/16/2015  . 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 Lyme disease 12/16/2015  . Small intestinal bacterial overgrowth 11/19/2015  . Hyperlipidemia 09/25/2015  . Abdominal bloating 09/04/2015  . Closed displaced intra-articular fracture of right calcaneus 06/16/2015  . Myopia of  both eyes with astigmatism and presbyopia 05/21/2015  . Vitreous syneresis of both eyes 05/21/2015  . Combined form of age-related cataract, left eye 05/21/2015  . 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 Pseudophakia, right eye 05/21/2015  . Surgery, elective 05/06/2015  . CAD (coronary artery disease) 11/19/2014  . Diabetes mellitus type I ([place]) 11/19/2014  . DDD (degenerative disc disease), cervical 03/11/2014  . Cervical spondylosis with radiculopathy 03/11/2014  . Spondylolisthesis of cervical region 03/11/2014  . Vitreous hemorrhage of left eye ([place]) 03/03/2014  . DDD (degenerative disc disease), thoracic 01/07/2014  . Anemia due to stage 3 chronic kidney disease ([place]) 12/16/2013  . Osteoporosis 11/14/2013  . Chronic  diastolic heart failure ([place]) 09/18/2013  . Cataract due to secondary diabetes ([place]) 08/26/2013  . 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 associated with type 2 diabetes mellitus ([place]) 08/26/2013  . Secondary diabetes mellitus with ophthalmic complication ([place]) 83/41/9622  . Type 1 diabetes mellitus with hyperosmolar coma ([place]) 08/26/2013  . Hypothyroidism due to acquired atrophy of thyroid 08/02/2013  . Acquired hypothyroidism 08/02/2013  . Cerebral artery occlusion with cerebral infarction ([place]) 07/14/2013  . History of diabetic gastroparesis 04/09/2013  . Irritable bowel syndrome with diarrhea 04/09/2013  . Diarrhea 04/09/2013  . History of endocrine, metabolic or immunity disorder 04/09/2013  . Anemia of chronic disease 04/08/2013  . 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 ([place]) 29/79/8921  . CKD stage 3 due to type 1 diabetes mellitus ([place]) 04/08/2013  . Anemia 04/08/2013    Past Surgical History:  Procedure Laterality Date  . ABDOMINAL HYSTERECTOMY    . APPENDECTOMY    . BREAST SURGERY     B/L biopsy and lumpectomy   . CARDIAC CATHETERIZATION    . CARPAL TUNNEL RELEASE    . CATARACT EXTRACTION W/ INTRAOCULAR LENS IMPLANT     right eye  . COLONOSCOPY W/ BIOPSIES AND POLYPECTOMY    . CORONARY ARTERY BYPASS GRAFT    . coronary artery stents     at [place] and [place]  . ECTOPIC PREGNANCY SURGERY    . NASAL SEPTUM SURGERY    . OPEN REDUCTION INTERNAL FIXATION (ORIF) DISTAL RADIAL FRACTURE Right 05/06/2015   Procedure: OPEN REDUCTION INTERNAL FIXATION (ORIF) RIGHT DISTAL RADIAL FRACTURE AND REPAIRS AS NEEDED;  Surgeon: Iran Planas, MD;  Location: [facility];  Service: Orthopedics;  Laterality: Right;  . ORIF WRIST FRACTURE Left 11/05/2014  . ORIF WRIST FRACTURE Left 11/05/2014   Procedure: OPEN REDUCTION INTERNAL FIXATION (ORIF) LEFT WRIST FRACTURE AND REPAIR AS INDICATED;  Surgeon: Iran Planas, MD;  Location: [facility];  Service: Orthopedics;   Laterality: Left;  . TRIGGER FINGER RELEASE      Current Outpatient Medications  Medication Sig Dispense Refill  . AMBULATORY NON FORMULARY MEDICATION Incontinence pads per patient preference, #unlimited, refill x99 Fax to [phone] 1 Units prn  . arginine 500 MG tablet Take 500 mg by mouth as directed.     . Armodafinil 250 MG tablet Take 1 tablet (250 mg total) by mouth daily. OR can take 0.5 tablet (125 mg) po bid 90 tablet 1  . aspirin EC 325 MG tablet Take 1 tablet (325 mg total) by mouth daily. 30 tablet 0  . carvedilol (COREG) 12.5 MG tablet Take 1 tablet (12.5 mg total) by mouth 2 (two) times daily with a meal. 180 tablet 3  . Cholecalciferol (VITAMIN D3) 5000 UNITS TABS Take 1,000 Units by mouth daily.     . Continuous  Blood Gluc Sensor (DEXCOM G6 SENSOR) MISC USE TO CONTINUOUSLY MONITOR BLOOD SUGARS. CHANGE EVERY 10 DAYS    . cyclobenzaprine (FLEXERIL) 10 MG tablet Take 0.5-1 tablets (5-10 mg total) by mouth 3 (three) times daily as needed for muscle spasms. Caution: can cause drowsiness 60 tablet 1  . diphenhydrAMINE HCl (BENADRYL ALLERGY PO) Take by mouth.    . diphenoxylate-atropine (LOMOTIL) 2.5-0.025 MG tablet TAKE 1 TABLET BY MOUTH 4 TIMES DAILY AS NEEDED FOR  DIARRHEA  OR  LOOSE  STOOLS 90 tablet 0  . ELDERBERRY PO Take by mouth.    . Esomeprazole Magnesium (NEXIUM PO) Take 20 mg by mouth every morning.     Marland Kitchen estradiol (ESTRACE VAGINAL) 0.1 MG/GM vaginal cream Place 1 Applicatorful vaginally at bedtime for 7 days, THEN 1 Applicatorful 3 (three) times a week. 42.5 g 12  . gabapentin (NEURONTIN) 100 MG capsule Take 1-2 capsules (100-200 mg total) by mouth 3 (three) times daily as needed. 180 capsule 5  . insulin aspart (NOVOLOG) 100 UNIT/ML injection USE VIA INSULIN PUMP TOTAL DAILY INSULIN 40 UNITS    . Insulin Human (INSULIN PUMP) SOLN Inject into the skin continuous. Novolog insulin    . ketoconazole (NIZORAL) 2 % shampoo APPLY 1 APPLICATION TOPICALLY 2 (TWO) TIMES A WEEK.  240 mL 1  . lidocaine-prilocaine (EMLA) cream Apply 1 application topically every 2 (two) hours as needed. Please run with good Rx discount coupon 30 g 0  . Linoleic Acid Conjugated 1000 MG CAPS Take 1,000 mg by mouth daily.     . Melatonin 5 MG CHEW Chew 1 tablet by mouth at bedtime.    . nitroGLYCERIN (NITROLINGUAL) 0.4 MG/SPRAY spray Place 1 spray under the tongue every 5 (five) minutes x 3 doses as needed for chest pain. 12 g 1  . NON FORMULARY Chloroella    . OVER THE COUNTER MEDICATION as directed. Magnesium L-Treonate    . Pancrelipase, Lip-Prot-Amyl, (CREON) 3000-9500 units CPEP Take by mouth.    . spironolactone (ALDACTONE) 25 MG tablet Take 50 mg by mouth.    . thyroid (ARMOUR THYROID) 60 MG tablet Take 1 tablet (60 mg total) by mouth daily before breakfast. 90 tablet 3  . torsemide ([name]) 20 MG tablet TAKE 1 TABLET ONCE DAILY INTHE MORNING. MAY TAKE AN   EXTRA 1/2 TABLET AS        NEEDED (Patient taking differently: every other day. TAKE 1 TABLET ONCE DAILY INTHE MORNING. MAY TAKE AN   EXTRA 1/2 TABLET AS NEEDED) 135 tablet 0   No current facility-administered medications for this visit.    Allergies as of 04/01/2020 - Review Complete 04/01/2020  Allergen Reaction Noted  . Morphine Anaphylaxis and Shortness Of Breath 02/07/2013  . Morphine and related Shortness Of Breath 11/04/2014  . Penicillins Hives 02/07/2013  . Tramadol Anaphylaxis 11/19/2014  . Tramadol-acetaminophen Other (See Comments) 02/07/2013  . Acetaminophen Other (See Comments) 11/06/2014  . Amitriptyline Other (See Comments) 02/07/2013  . Codeine Other (See Comments) 02/07/2013  . Ibuprofen Other (See Comments) 05/05/2015  . Losartan Cough 11/20/2018  . Propoxyphene Other (See Comments) 02/07/2013  . Shellfish allergy Diarrhea and Nausea And Vomiting 07/12/2017  . Statins Other (See Comments) 02/07/2013  . Sulfamethoxazole Other (See Comments) 02/07/2013  . Trichophyton Itching and Other (See Comments)  11/04/2014    Vitals: BP (!) 170/76 (BP Location: Right Arm, Patient Position: Sitting)   Pulse 67   Ht 4' 11.75" (1.518 m)   Wt 97 lb (44 kg)  BMI 19.10 kg/m  Last Weight:  Wt Readings from Last 1 Encounters:  04/01/20 97 lb (44 kg)   Last Height:   Ht Readings from Last 1 Encounters:  04/01/20 4' 11.75" (1.518 m)     Physical exam: Exam: Gen: NAD, conversant, thin, quite tangential                  Neuro: Detailed Neurologic Exam  Speech:    Speech is normal; fluent and spontaneous Cognition:    The patient is oriented to person, place, and time; decreased attention and concentration.  Cranial Nerves:    The pupils are equal, round, and reactive to light.  Visual fields are full to threat  Extraocular movements are intact. The face is symmetric. The palate elevates in the midline. Hearing intact. Voice is normal. Shoulder shrug is normal.  Coordination:    No dysmetria or ataxia  Gait:    Normal native gait  Motor Observation:    No asymmetry, no atrophy, and no involuntary movements noted. Tone:    Normal muscle tone.    Posture:    Posture is normal. normal erect    Strength:    No focal deficits in strength noted     Sensation: intact to LT       Assessment/Plan:   65 y.o. female here as requested by Emeterio Reeve, DO for migraines.  This is a patient with an extremely complicated past medical history including coronary artery disease, cerebral infarction, heart failure, hypertension, asthma, IBS, tardive dyskinesias, abnormal MRI of the brain with moderate to advanced white matter changes and atrophy, diabetes type 1 with multiple complications, hypothyroidism, CKD, chronic neck pain, neuropathy, degenerative disc disease multiple levels of the spine, anemia, history of Lyme disease, depression, memory loss, chronic headaches and migraines, dizziness, multiple strokes.  -I had a long discussion about her chronic migraines with patient and her  husband.  She has tried multiple medications and failed them, I hesitate to start anymore oral daily medications due to polypharmacy, I do think her best solution would be either to choose a CGRP medication such as Ajovy or Emgality or Botox.  I think Botox may be more helpful in this patient and may help with neck pain.  We will set her up for Botox.  - PT for neck dry needling [place]   Orders Placed This Encounter  Procedures  . Ambulatory referral to Physical Therapy   No orders of the defined types were placed in this encounter.   Cc: Emeterio Reeve, DO,  Emeterio Reeve, DO  Sarina Ill, MD  [facility] [address] [place], [place] [address]  Phone [phone] Fax [phone]  I spent over 60 minutes of face-to-face and non-face-to-face time with patient on the  1. Chronic migraine without aura, with intractable migraine, so stated, with status migrainosus   2. Cervical myofascial pain syndrome    diagnosis.  This included previsit chart review, lab review, study review, order entry, electronic health record documentation, patient education on the different diagnostic and therapeutic options, counseling and coordination of care, risks and benefits of management, compliance, or risk factor reduction

## 2020-04-01 ENCOUNTER — Telehealth: Payer: Self-pay

## 2020-04-01 ENCOUNTER — Ambulatory Visit (INDEPENDENT_AMBULATORY_CARE_PROVIDER_SITE_OTHER): Payer: No Typology Code available for payment source | Admitting: Neurology

## 2020-04-01 ENCOUNTER — Telehealth: Payer: Self-pay | Admitting: Neurology

## 2020-04-01 ENCOUNTER — Encounter: Payer: Self-pay | Admitting: Neurology

## 2020-04-01 VITALS — BP 170/76 | HR 67 | Ht 59.75 in | Wt 97.0 lb

## 2020-04-01 DIAGNOSIS — G43711 Chronic migraine without aura, intractable, with status migrainosus: Secondary | ICD-10-CM

## 2020-04-01 DIAGNOSIS — M7918 Myalgia, other site: Secondary | ICD-10-CM | POA: Diagnosis not present

## 2020-04-01 NOTE — Patient Instructions (Signed)
OnabotulinumtoxinA injection (Medical Use) What is this medicine? ONABOTULINUMTOXINA (o na BOTT you lye num tox in eh) is a neuro-muscular blocker. This medicine is used to treat crossed eyes, eyelid spasms, severe neck muscle spasms, ankle and toe muscle spasms, and elbow, wrist, and finger muscle spasms. It is also used to treat excessive underarm sweating, to prevent chronic migraine headaches, and to treat loss of bladder control due to neurologic conditions such as multiple sclerosis or spinal cord injury. This medicine may be used for other purposes; ask your health care provider or pharmacist if you have questions. COMMON BRAND NAME(S): Botox What should I tell my health care provider before I take this medicine? They need to know if you have any of these conditions:  breathing problems  cerebral palsy spasms  difficulty urinating  heart problems  history of surgery where this medicine is going to be used  infection at the site where this medicine is going to be used  myasthenia gravis or other neurologic disease  nerve or muscle disease  surgery plans  take medicines that treat or prevent blood clots  thyroid problems  an unusual or allergic reaction to botulinum toxin, albumin, other medicines, foods, dyes, or preservatives  pregnant or trying to get pregnant  breast-feeding How should I use this medicine? This medicine is for injection into a muscle. It is given by a health care professional in a hospital or clinic setting. Talk to your pediatrician regarding the use of this medicine in children. While this drug may be prescribed for children as young as 11 years old for selected conditions, precautions do apply. Overdosage: If you think you have taken too much of this medicine contact a poison control center or emergency room at once. NOTE: This medicine is only for you. Do not share this medicine with others. What if I miss a dose? This does not apply. What may  interact with this medicine?  aminoglycoside antibiotics like gentamicin, neomycin, tobramycin  muscle relaxants  other botulinum toxin injections This list may not describe all possible interactions. Give your health care provider a list of all the medicines, herbs, non-prescription drugs, or dietary supplements you use. Also tell them if you smoke, drink alcohol, or use illegal drugs. Some items may interact with your medicine. What should I watch for while using this medicine? Visit your doctor for regular check ups. This medicine will cause weakness in the muscle where it is injected. Tell your doctor if you feel unusually weak in other muscles. Get medical help right away if you have problems with breathing, swallowing, or talking. This medicine might make your eyelids droop or make you see blurry or double. If you have weak muscles or trouble seeing do not drive a car, use machinery, or do other dangerous activities. This medicine contains albumin from human blood. It may be possible to pass an infection in this medicine, but no cases have been reported. Talk to your doctor about the risks and benefits of this medicine. If your activities have been limited by your condition, go back to your regular routine slowly after treatment with this medicine. What side effects may I notice from receiving this medicine? Side effects that you should report to your doctor or health care professional as soon as possible:  allergic reactions like skin rash, itching or hives, swelling of the face, lips, or tongue  breathing problems  changes in vision  chest pain or tightness  eye irritation, pain  fast, irregular heartbeat  infection  numbness  speech problems  swallowing problems  unusual weakness Side effects that usually do not require medical attention (report to your doctor or health care professional if they continue or are bothersome):  bruising or pain at site where  injected  drooping eyelid  dry eyes or mouth  headache  muscles aches, pains  sensitivity to light  tearing This list may not describe all possible side effects. Call your doctor for medical advice about side effects. You may report side effects to FDA at 1-800-FDA-1088. Where should I keep my medicine? This drug is given in a hospital or clinic and will not be stored at home. NOTE: This sheet is a summary. It may not cover all possible information. If you have questions about this medicine, talk to your doctor, pharmacist, or health care provider.  2021 Elsevier/Gold Standard (2017-07-17 14:21:42)

## 2020-04-01 NOTE — Telephone Encounter (Signed)
Prior authorization for Armodanfinil sent to patient's insurance. Pending a determination.

## 2020-04-01 NOTE — Telephone Encounter (Signed)
Please start the botox approval process for patient. G43.711. Patient should stay with me and can be added at the end of a day.

## 2020-04-01 NOTE — Telephone Encounter (Signed)
Prior authorization for Armodafinil approved by patient's insurance.   As long as you remain covered by your prescription drug plan and there are no changes to your plan benefits, this request is approved for the following time period: 04/01/2020 - 04/01/2021.  [facility] pharmacy has been updated.

## 2020-04-02 NOTE — Telephone Encounter (Signed)
Completed Botox order form. Pending MD signature. Patient will need to sign consent at first injection appointment.

## 2020-04-08 ENCOUNTER — Encounter: Payer: Self-pay | Admitting: Osteopathic Medicine

## 2020-04-09 NOTE — Telephone Encounter (Signed)
Fine with me I can call in a prescription. First month is 2 injections and then one injection every month afterwards. If she agrees let me know, she can be called next week thanks

## 2020-04-13 ENCOUNTER — Telehealth: Payer: Self-pay

## 2020-04-13 MED ORDER — EMGALITY 120 MG/ML ~~LOC~~ SOAJ: 120.0000 mg | SUBCUTANEOUS | 5 refills | Status: DC

## 2020-04-13 MED ORDER — EMGALITY 120 MG/ML ~~LOC~~ SOAJ: 240.0000 mg | Freq: Once | SUBCUTANEOUS | 0 refills | Status: AC

## 2020-04-13 NOTE — Telephone Encounter (Signed)
Pt  Called and left a vm to tr/s her appts to 3/22 if avail.  There was one opening left and I have r/s her 3/23 to 3/22 and left her a vm with the appt date and time    Brittney Pace

## 2020-04-13 NOTE — Telephone Encounter (Addendum)
I spoke with the patient and let her know that Dr Jaynee Eagles is fine with pt's decision to not start the Botox and instead we will try the [facility]. We discussed the dosing instructions of loading dose 240 mg followed by 120 mg every 30 days. We discussed the injection site options and the proper prep of hand hygiene and cleansing skin with alcohol before injection. Pt has T1D and is familiar with injections. Her questions were answered and she verbalized appreciation for the call. Pt aware this med is administered at home. Pt also aware more information will be sent to her mychart.   Received v.o. from Dr Jaynee Eagles for Emgality 240 mg loading dose followed by 120 mg every 30 days. Order sent to [facility] on [address] in [place], pt aware.

## 2020-04-13 NOTE — Addendum Note (Signed)
Addended by: Gildardo Griffes on: 04/13/2020 03:40 PM   Modules accepted: Orders

## 2020-04-14 ENCOUNTER — Other Ambulatory Visit: Payer: Self-pay

## 2020-04-14 ENCOUNTER — Inpatient Hospital Stay: Payer: No Typology Code available for payment source

## 2020-04-14 ENCOUNTER — Telehealth: Payer: Self-pay | Admitting: *Deleted

## 2020-04-14 ENCOUNTER — Inpatient Hospital Stay (HOSPITAL_BASED_OUTPATIENT_CLINIC_OR_DEPARTMENT_OTHER): Payer: No Typology Code available for payment source | Admitting: Family

## 2020-04-14 ENCOUNTER — Ambulatory Visit: Payer: No Typology Code available for payment source

## 2020-04-14 ENCOUNTER — Encounter: Payer: Self-pay | Admitting: Family

## 2020-04-14 VITALS — BP 161/67 | HR 65 | Resp 20 | Ht 59.75 in | Wt 95.0 lb

## 2020-04-14 DIAGNOSIS — D508 Other iron deficiency anemias: Secondary | ICD-10-CM

## 2020-04-14 DIAGNOSIS — D631 Anemia in chronic kidney disease: Secondary | ICD-10-CM

## 2020-04-14 DIAGNOSIS — N1831 Chronic kidney disease, stage 3a: Secondary | ICD-10-CM

## 2020-04-14 DIAGNOSIS — N189 Chronic kidney disease, unspecified: Secondary | ICD-10-CM | POA: Diagnosis not present

## 2020-04-14 LAB — CMP (CANCER CENTER ONLY)
ALT: 12 U/L (ref 0–44)
AST: 19 U/L (ref 15–41)
Albumin: 3.7 g/dL (ref 3.5–5.0)
Alkaline Phosphatase: 90 U/L (ref 38–126)
Anion gap: 7 (ref 5–15)
BUN: 25 mg/dL — ABNORMAL HIGH (ref 8–23)
CO2: 27 mmol/L (ref 22–32)
Calcium: 9 mg/dL (ref 8.9–10.3)
Chloride: 97 mmol/L — ABNORMAL LOW (ref 98–111)
Creatinine: 1.33 mg/dL — ABNORMAL HIGH (ref 0.44–1.00)
GFR, Estimated: 44 mL/min — ABNORMAL LOW (ref >60)
Glucose, Bld: 364 mg/dL — ABNORMAL HIGH (ref 70–99)
Potassium: 5.1 mmol/L (ref 3.5–5.1)
Sodium: 131 mmol/L — ABNORMAL LOW (ref 135–145)
Total Bilirubin: 0.3 mg/dL (ref 0.3–1.2)
Total Protein: 6.1 g/dL — ABNORMAL LOW (ref 6.5–8.1)

## 2020-04-14 LAB — CBC WITH DIFFERENTIAL (CANCER CENTER ONLY)
Abs Immature Granulocytes: 0.03 10*3/uL (ref 0.00–0.07)
Basophils Absolute: 0.1 10*3/uL (ref 0.0–0.1)
Basophils Relative: 1 %
Eosinophils Absolute: 0.3 10*3/uL (ref 0.0–0.5)
Eosinophils Relative: 4 %
HCT: 37.1 % (ref 36.0–46.0)
Hemoglobin: 11.5 g/dL — ABNORMAL LOW (ref 12.0–15.0)
Immature Granulocytes: 1 %
Lymphocytes Relative: 17 %
Lymphs Abs: 1.1 10*3/uL (ref 0.7–4.0)
MCH: 26.9 pg (ref 26.0–34.0)
MCHC: 31 g/dL (ref 30.0–36.0)
MCV: 86.7 fL (ref 80.0–100.0)
Monocytes Absolute: 0.5 10*3/uL (ref 0.1–1.0)
Monocytes Relative: 8 %
Neutro Abs: 4.6 10*3/uL (ref 1.7–7.7)
Neutrophils Relative %: 69 %
Platelet Count: 341 10*3/uL (ref 150–400)
RBC: 4.28 MIL/uL (ref 3.87–5.11)
RDW: 14.4 % (ref 11.5–15.5)
WBC Count: 6.6 10*3/uL (ref 4.0–10.5)
nRBC: 0 % (ref 0.0–0.2)

## 2020-04-14 LAB — RETICULOCYTES
Immature Retic Fract: 11.5 % (ref 2.3–15.9)
RBC.: 4.3 MIL/uL (ref 3.87–5.11)
Retic Count, Absolute: 43.5 10*3/uL (ref 19.0–186.0)
Retic Ct Pct: 1 % (ref 0.4–3.1)

## 2020-04-14 LAB — IRON AND TIBC
Iron: 66 ug/dL (ref 41–142)
Saturation Ratios: 19 % — ABNORMAL LOW (ref 21–57)
TIBC: 350 ug/dL (ref 236–444)
UIBC: 284 ug/dL (ref 120–384)

## 2020-04-14 LAB — FERRITIN: Ferritin: 18 ng/mL (ref 11–307)

## 2020-04-14 NOTE — Telephone Encounter (Signed)
Completed Emgality PA on Cover My Meds. Key: [id number]. Awaiting determination from [facility] part D.

## 2020-04-14 NOTE — Telephone Encounter (Signed)
Per [employer] [employer], approved from 04/14/2020 - 07/15/2020.

## 2020-04-14 NOTE — Telephone Encounter (Signed)
Faxed Emgality approval letter to pt's pharmacy. Received a receipt of confirmation.

## 2020-04-14 NOTE — Progress Notes (Addendum)
Hematology and Oncology Follow Up Visit  Brittney Pace [id number] 02-25-1955 65 y.o. 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 She is feeling fatigued and still struggling with a persistent rash that she feels is related to candida. She states that she has a long history of candidal rashes since being diagnosed with diabetes as a child. She has a new patient appointment with primary care downstairs in May and is hoping for a referral to ID for further assessment.  He blood sugars are not well controlled. She had hypoglycemia this morning and drank some juice before coming in.  No fever, chills, cough, rash, dizziness, SOB, chest pain, palpitations, abdominal pain or changes in bladder habits.  She states that she has been taking her pancreatic enzymes daily and notes that her diarrhea (IBS) seems to have improved. She has not noted any episodes of blood loss. No bruising or petechiae.   No swelling in her extremities.  She has neuropathy in the hands and feet which she describes as unchanged.  No falls or syncope to report.  She has maintained a good appetite and is doing her best to stay well hydrated. Her weight is stable. She notes that she has been eating less red meat lately.   ECOG Performance Status: 1 - Symptomatic but completely ambulatory  Medications:  Allergies as of 04/14/2020      Reactions   Morphine Anaphylaxis, Shortness Of Breath   Morphine And Related Shortness Of Breath   Penicillins Hives   Has patient had a PCN reaction causing immediate rash, facial/tongue/throat swelling, SOB or lightheadedness with hypotension: Yes Has patient had a PCN reaction causing severe rash involving mucus membranes or skin necrosis: No Has patient had a PCN  reaction that required hospitalization No Has patient had a PCN reaction occurring within the last 10 years: No If all of the above answers are "NO", then may proceed with Cephalosporin use.   Tramadol Anaphylaxis   Tramadol-acetaminophen Other (See Comments)   Small vessel heart attack Other reaction(s): Cardiovascular Arrest (ALLERGY/intolerance), Other (See Comments)   Acetaminophen Other (See Comments)   Alters insulin pump readings   Amitriptyline Other (See Comments)   Severe headache/ out of body feeling   Codeine Other (See Comments)   Severe headaches/ out of body feeling   Ibuprofen Other (See Comments)   Messes up CGM reading on glucose monitor   Losartan Cough   Propoxyphene Other (See Comments)   Severe headaches / out of body feeling   Shellfish Allergy Diarrhea, Nausea And Vomiting   Statins Other (See Comments)   Muscle pains Other reaction(s): Other   Sulfamethoxazole Other (See Comments)   Mouth ulcers   Trichophyton Itching, Other (See Comments)   (fungus) sinusitis      Medication List       Accurate as of April 14, 2020 10:37 AM. If you have any questions, ask your nurse or doctor.        AMBULATORY NON FORMULARY MEDICATION Incontinence pads per patient preference, #unlimited, refill [phone] Fax to [phone]   arginine 500 MG tablet Take 500 mg by mouth as directed.   Armodafinil 250 MG tablet Take 1 tablet (250 mg total) by mouth daily. OR can take 0.5 tablet (125 mg) po bid   aspirin EC 325 MG tablet  Take 1 tablet (325 mg total) by mouth daily.   BENADRYL ALLERGY PO Take by mouth.   carvedilol 12.5 MG tablet Commonly known as: COREG Take 1 tablet (12.5 mg total) by mouth 2 (two) times daily with a meal.   Creon 3000-9500 units Cpep Generic drug: Pancrelipase (Lip-Prot-Amyl) Take by mouth.   cyclobenzaprine 10 MG tablet Commonly known as: FLEXERIL Take 0.5-1 tablets (5-10 mg total) by mouth 3 (three) times daily as needed for muscle  spasms. Caution: can cause drowsiness   Dexcom G6 Sensor Misc USE TO CONTINUOUSLY MONITOR BLOOD SUGARS. CHANGE EVERY 10 DAYS   diphenoxylate-atropine 2.5-0.025 MG tablet Commonly known as: LOMOTIL TAKE 1 TABLET BY MOUTH 4 TIMES DAILY AS NEEDED FOR  DIARRHEA  OR  LOOSE  STOOLS   ELDERBERRY PO Take by mouth.   Emgality 120 MG/ML Soaj Generic drug: Galcanezumab-gnlm Inject 120 mg into the skin every 30 (thirty) days. Start taking on: May 14, 2020   estradiol 0.1 MG/GM vaginal cream Commonly known as: ESTRACE VAGINAL Place 1 Applicatorful vaginally at bedtime for 7 days, THEN 1 Applicatorful 3 (three) times a week. Start taking on: February 13, 2020   gabapentin 100 MG capsule Commonly known as: NEURONTIN Take 1-2 capsules (100-200 mg total) by mouth 3 (three) times daily as needed.   insulin aspart 100 UNIT/ML injection Commonly known as: novoLOG USE VIA INSULIN PUMP TOTAL DAILY INSULIN 40 UNITS   insulin pump Soln Inject into the skin continuous. Novolog insulin   ketoconazole 2 % shampoo Commonly known as: NIZORAL APPLY 1 APPLICATION TOPICALLY 2 (TWO) TIMES A WEEK.   lidocaine-prilocaine cream Commonly known as: EMLA Apply 1 application topically every 2 (two) hours as needed. Please run with good Rx discount coupon   Linoleic Acid Conjugated 1000 MG Caps Take 1,000 mg by mouth daily.   Melatonin 5 MG Chew Chew 1 tablet by mouth at bedtime.   NEXIUM PO Take 20 mg by mouth every morning.   nitroGLYCERIN 0.4 MG/SPRAY spray Commonly known as: NITROLINGUAL Place 1 spray under the tongue every 5 (five) minutes x 3 doses as needed for chest pain.   NON FORMULARY Chloroella   OVER THE COUNTER MEDICATION as directed. Magnesium L-Treonate   spironolactone 25 MG tablet Commonly known as: ALDACTONE Take 50 mg by mouth.   thyroid 60 MG tablet Commonly known as: Armour Thyroid Take 1 tablet (60 mg total) by mouth daily before breakfast.   torsemide 20 MG  tablet Commonly known as: [name] TAKE 1 TABLET ONCE DAILY INTHE MORNING. MAY TAKE AN   EXTRA 1/2 TABLET AS        NEEDED What changed: See the new instructions.   Vitamin D3 125 MCG (5000 UT) Tabs Take 1,000 Units by mouth daily.       Allergies:  Allergies  Allergen Reactions  . Morphine Anaphylaxis and Shortness Of Breath  . Morphine And Related Shortness Of Breath  . Penicillins Hives    Has patient had a PCN reaction causing immediate rash, facial/tongue/throat swelling, SOB or lightheadedness with hypotension: Yes Has patient had a PCN reaction causing severe rash involving mucus membranes or skin necrosis: No Has patient had a PCN reaction that required hospitalization No Has patient had a PCN reaction occurring within the last 10 years: No If all of the above answers are "NO", then may proceed with Cephalosporin use.  . Tramadol Anaphylaxis  . Tramadol-Acetaminophen Other (See Comments)    Small vessel heart attack Other reaction(s): Cardiovascular Arrest (ALLERGY/intolerance),  Other (See Comments)   . Acetaminophen Other (See Comments)    Alters insulin pump readings   . Amitriptyline Other (See Comments)    Severe headache/ out of body feeling  . Codeine Other (See Comments)    Severe headaches/ out of body feeling   . Ibuprofen Other (See Comments)    Messes up CGM reading on glucose monitor    . Losartan Cough  . Propoxyphene Other (See Comments)    Severe headaches / out of body feeling   . Shellfish Allergy Diarrhea and Nausea And Vomiting  . Statins Other (See Comments)    Muscle pains Other reaction(s): Other  . Sulfamethoxazole Other (See Comments)    Mouth ulcers   . Trichophyton Itching and Other (See Comments)    (fungus) sinusitis     Past Medical History, Surgical history, Social history, and Family History were reviewed and updated.  Review of Systems: All other 10 point review of systems is negative.   Physical Exam:  vitals were  not taken for this visit.   Wt Readings from Last 3 Encounters:  04/01/20 97 lb (44 kg)  03/04/20 100 lb 12.8 oz (45.7 kg)  01/22/20 98 lb 6.4 oz (44.6 kg)    Ocular: Sclerae unicteric, pupils equal, round and reactive to light Ear-nose-throat: Oropharynx clear, dentition fair Lymphatic: No cervical or supraclavicular adenopathy Lungs no rales or rhonchi, good excursion bilaterally Heart regular rate and rhythm, no murmur appreciated Abd soft, nontender, positive bowel sounds MSK no focal spinal tenderness, no joint edema Neuro: non-focal, well-oriented, appropriate affect Breasts: Deferred   Lab Results  Component Value Date   WBC 6.6 04/14/2020   HGB 11.5 (L) 04/14/2020   HCT 37.1 04/14/2020   MCV 86.7 04/14/2020   PLT 341 04/14/2020   Lab Results  Component Value Date   FERRITIN 72 03/04/2020   IRON 70 03/04/2020   TIBC 276 03/04/2020   UIBC 207 03/04/2020   IRONPCTSAT 25 03/04/2020   Lab Results  Component Value Date   RETICCTPCT 1.0 04/14/2020   RBC 4.30 04/14/2020   RBC 4.28 04/14/2020   No results found for: KPAFRELGTCHN, LAMBDASER, KAPLAMBRATIO No results found for: IGGSERUM, IGA, IGMSERUM No results found for: Kathrynn Ducking, MSPIKE, SPEI   Chemistry      Component Value Date/Time   NA 130 (L) 03/24/2020 1319   NA 138 07/17/2017 1128   K 4.9 03/24/2020 1319   CL 99 03/24/2020 1319   CO2 26 03/24/2020 1319   BUN 25 (H) 03/24/2020 1319   BUN 23 07/17/2017 1128   CREATININE 1.08 (H) 03/24/2020 1319   CREATININE 0.99 07/20/2018 0821      Component Value Date/Time   CALCIUM 9.2 03/24/2020 1319   ALKPHOS 81 03/24/2020 1319   AST 29 03/24/2020 1319   ALT 15 03/24/2020 1319   BILITOT 0.4 03/24/2020 1319       Impression and Plan: Ms. Mincy is a very pleasant 65 yo female with multifactorial anemia. No ESA needed today, Hgb 11.5.  Iron studies pending. We will replace if needed.  Lab and injection  every 3 weeks, follow-up in 12 weeks.  She can contact our office with any questions or concerns.   Laverna Peace, NP 3/22/202210:37 AM

## 2020-04-15 ENCOUNTER — Other Ambulatory Visit: Payer: Self-pay | Admitting: Family

## 2020-04-15 ENCOUNTER — Inpatient Hospital Stay: Payer: No Typology Code available for payment source

## 2020-04-15 ENCOUNTER — Inpatient Hospital Stay: Payer: No Typology Code available for payment source | Admitting: Family

## 2020-04-15 ENCOUNTER — Telehealth: Payer: Self-pay | Admitting: *Deleted

## 2020-04-15 NOTE — Telephone Encounter (Signed)
Call placed to patient to see if she is ok with getting IV iron per order of S. [place] NP.  Lab work reviewed with patient.  Pt states that she would prefer to wait on iron infusion at this time. Jory Ee NP notified.

## 2020-04-15 NOTE — Telephone Encounter (Signed)
-----   Message from Eliezer Bottom, NP sent at 04/15/2020  8:23 AM EDT ----- Iron saturation is slightly low. Is she ok with getting Iv iron?   ----- Message ----- From: Interface, Lab In [place] Sent: 04/14/2020  10:20 AM EDT To: Eliezer Bottom, NP

## 2020-04-15 NOTE — Telephone Encounter (Signed)
Spoke with Educational psychologist and then the pharmacy help desk. I was told that the max dollar amount was exceeded and they would have to process an override which was successful. I called the pharmacy back and they were able to process the medication refill and will fill the 2 pens loading dose today.

## 2020-04-24 DIAGNOSIS — S42309A Unspecified fracture of shaft of humerus, unspecified arm, initial encounter for closed fracture: Secondary | ICD-10-CM | POA: Insufficient documentation

## 2020-04-29 ENCOUNTER — Telehealth: Payer: Self-pay | Admitting: Cardiology

## 2020-04-29 ENCOUNTER — Other Ambulatory Visit: Payer: Self-pay

## 2020-04-29 ENCOUNTER — Encounter (HOSPITAL_COMMUNITY): Payer: Self-pay | Admitting: Orthopedic Surgery

## 2020-04-29 NOTE — Anesthesia Preprocedure Evaluation (Addendum)
Anesthesia Evaluation  Patient identified by MRN, date of birth, ID band Patient awake    Reviewed: Allergy & Precautions, H&P , NPO status , Patient's Chart, lab work & pertinent test results  Airway Mallampati: II   Neck ROM: full    Dental   Pulmonary asthma , former smoker,    breath sounds clear to auscultation       Cardiovascular hypertension, + CAD, + Past MI, + CABG, + Peripheral Vascular Disease and +CHF  + Valvular Problems/Murmurs MVP  Rhythm:regular Rate:Normal  TTE (2020): EF 55-60%, MVP   Neuro/Psych  Headaches, PSYCHIATRIC DISORDERS Depression CVA    GI/Hepatic GERD  ,  Endo/Other  diabetesHypothyroidism   Renal/GU Renal InsufficiencyRenal disease     Musculoskeletal  (+) Arthritis , Fibromyalgia -  Abdominal   Peds  Hematology  (+) Blood dyscrasia, anemia , Hgb 9.9   Anesthesia Other Findings   Reproductive/Obstetrics                            Anesthesia Physical Anesthesia Plan  ASA: III  Anesthesia Plan: General   Post-op Pain Management:  Regional for Post-op pain   Induction: Intravenous  PONV Risk Score and Plan: 3 and Ondansetron, Dexamethasone, Treatment may vary due to age or medical condition and Midazolam  Airway Management Planned: Oral ETT  Additional Equipment:   Intra-op Plan:   Post-operative Plan: Extubation in OR  Informed Consent: I have reviewed the patients History and Physical, chart, labs and discussed the procedure including the risks, benefits and alternatives for the proposed anesthesia with the patient or authorized representative who has indicated his/her understanding and acceptance.     Dental advisory given  Plan Discussed with: CRNA, Anesthesiologist and Surgeon  Anesthesia Plan Comments: (See PAT note 04/29/2020, Konrad Felix, PA-C)       Anesthesia Quick Evaluation

## 2020-04-29 NOTE — Progress Notes (Addendum)
COVID Vaccine Completed: No Date COVID Vaccine completed:N/A Has received booster: No COVID vaccine manufacturer: [employer]  Date of COVID positive in last 90 days: No  PCP - Emeterio Reeve, DO Cardiologist - Dr. Kirk Ruths last office visit note 06/12/2019 in epic Endocrinologist: Dr. Smith Mince  Chest x-ray - N/A EKG - greater than 1 year Stress Test - greater than 2 years ECHO - greater than 2 years Cardiac Cath - greater than 2 years Pacemaker/ICD device last checked: N/A  Sleep Study - N/A CPAP - N/A  Fasting Blood Sugar - 60-260 Checks Blood Sugar _____ times a day continous glucose monitor   Blood Thinner Instructions: N/A Aspirin Instructions: Yes Last Dose: 04/28/2020  Activity level:  Can go up a flight of stairs and activities of daily living without stopping and without symptoms    Anesthesia review: DM, CAD history of CABG, CHF, CKD, Stroke, MI  Patient denies shortness of breath, fever, cough and chest pain at PAT appointment   Patient verbalized understanding of instructions that were given to them at the PAT appointment. Patient was also instructed that they will need to review over the PAT instructions again at home before surgery.

## 2020-04-29 NOTE — Telephone Encounter (Signed)
   [facility] Pre-operative Risk Assessment    Patient Name: Brittney Pace   [facility] STAFF: - Please ensure there is not already an duplicate clearance open for this procedure. - Under Visit Info/Reason for Call, type in Other and utilize the format Clearance MM/DD/YY or Clearance TBD. Do not use dashes or single digits. - If request is for dental extraction, please clarify the # of teeth to be extracted.  Request for surgical clearance:  1. What type of surgery is being performed? ORIS of right proximal humerus fracture  2. When is this surgery scheduled? 04/30/2020 at 3:30 pm  3. What type of clearance is required (medical clearance vs. Pharmacy clearance to hold med vs. Both)? Medical  4. Are there any medications that need to be held prior to surgery and how long? Aspirin, already held   5. Practice name and name of physician performing surgery? [facility] , Dr. Lennette Bihari Supple  6. What is the office phone number? [phone]   7.   What is the office fax number? [phone]  8.   Anesthesia type (None, local, MAC, general) ? General    Selena Zobro 04/29/2020, 3:56 PM  _________________________________________________________________   (provider comments below)  Patient was an add on for emergency surgery

## 2020-04-29 NOTE — Progress Notes (Signed)
Medical history obtained from husband Brittney Pace.  Brittney Pace stated Brittney Pace had a rough day and had finally was sleeping. Attempted to do pre op with Brittney Pace.  Instructed Jim to contact Dr. Hermelinda Dellen to make him aware that August was having a 2+ hours surgery and to get instructions on how to manage her Novolog insulin pump. Also reviewed pre op instructions at this time as well. Brittney Pace verbalized understanding.

## 2020-04-29 NOTE — Telephone Encounter (Signed)
   Patient Name: Brittney Pace  DOB: 1955-04-26  MRN: [id number]   Primary Cardiologist: Kirk Ruths, MD  Chart reviewed as part of pre-operative protocol coverage. Patient was last seen by Dr. Stanford Breed in 05/2019. We were asked to give pre-op risk assessment for urgent surgery tomorrow following arm fracture. I called and spoke with patient today and she reports doing well from a cardiac standpoint since her last visit with Dr. Stanford Breed. BP better on Coreg. She states her biggest problem lately has been her anemia. She denies any chest pain, shortness of breath, syncope. It does not sound like she is very active but she is still able to complete 4.0 METS without any angina. Given past medical history and time since last visit, based on ACC/AHA guidelines, Brittney Pace would be at acceptable risk for the planned procedure. I do not think we need to delay urgent surgery for any additional cardiac testing.  Regarding Aspirin therapy, we typically recommend continuation of Aspirin throughout the perioperative period.  However, if the surgeon feels that cessation of Aspirin is required in the perioperative period, it may be stopped 5-7 days prior to surgery with a plan to resume it as soon as felt to be feasible from a surgical standpoint in the post-operative period.  I will route this recommendation to the requesting party via Epic fax function and remove from pre-op pool.  Please call with questions.  Darreld Mclean, PA-C 04/29/2020, 4:20 PM

## 2020-04-29 NOTE — Progress Notes (Addendum)
Anesthesia Chart Review   Case: [id number] Date/Time: 04/30/20 1515   Procedure: OPEN REDUCTION INTERNAL FIXATION (ORIF) PROXIMAL HUMERUS FRACTURE (Right ) - 169min   Anesthesia type: General   Pre-op diagnosis: Right proximal humerus fracture   Location: [facility] ROOM 06 / [facility]   Surgeons: Justice Britain, MD      DISCUSSION:64 y.o. former smoker with h/o GERD, CKD Stage III, DM II, CADs/p CABG X 2 (LIMA-LAD, SVG to distal RCA; in [place]), stroke, iron deficiency anemia, right proximal humerus fracture scheduled for above procedure 04/30/2020 with Dr. Justice Britain.   Same day workup, evaluate blood sugar DOS.   Pt last seen by cardiology 06/12/2019. Pt experiences atypical chest pain which is relieved with nitroglycerin.  Her last heart catheterization was August 2015 at [facility] which revealed two-vessel coronary disease with a patent LIMA to the LAD and SVG to the RCA. No obstructive disease in left circumflex.  She reports to PAT nurse no recent chest pain, can go up a flight of stairs without difficulty.   Discussed with Dr. Fransisco Beau.  Anticipate pt can proceed with planned procedure barring acute status change.   VS: Ht 4\' 11"  (1.499 m)   Wt 43.1 kg   BMI 19.19 kg/m   PROVIDERS: Emeterio Reeve, DO is PCP    LABS: labs DOS (all labs ordered are listed, but only abnormal results are displayed)  Labs Reviewed - No data to display   IMAGES:   EKG: 03/08/2019 Rate 70 bpm  NSR Cannot rule out anterior infarct, age undetermined  CV: Echo 01/25/2018 Study Conclusions   - Left ventricle: The cavity size was normal. Systolic function was  normal. The estimated ejection fraction was in the range of 55%  to 60%. Wall motion was normal; there were no regional wall  motion abnormalities. Left ventricular diastolic function  parameters were normal.  - Ventricular septum: Septal motion showed paradox.  - Mitral valve: Systolic bowing without prolapse. (&quot;prolapse&quot; is   seen in the 2-chamber view only, a nonspecific finding; there are  no myxomatous leaflet changes)  - Right ventricle: Systolic function was mildly reduced.  Cardiac Cath 09/18/2013 Occluded ostial LAD. LIMA-LAD widely patent with stent in LAD. LCX/OM without obstructive disease. RCA with obstructive disease but patent, patent SVG-DRCA . LVEF normal. No gradient. Past Medical History:  Diagnosis Date  . Anemia   . Arthritis   . 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 CHF (congestive heart failure) ([place])   . Chronic kidney disease    stage 3  . Coronary artery disease   . Depression   . Diabetes mellitus without complication (HCC)    Type 1  . Diabetic retinopathy ([place])   . 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 Family history of adverse reaction to anesthesia    mother: " while she was under she stopped breathing."  . Fibromyalgia   . Gastroparesis   . GERD (gastroesophageal reflux disease)   . Headache    migraines  . Hypothyroidism   . IBS (irritable bowel syndrome)   . Idiopathic edema   . Iron deficiency anemia 09/04/2019  . Lyme disease   . Mitral valve prolapse   . Myocardial infarction ([place])    1 major in 1999 and 2 minor " small vessel disease."  . Osteoporosis   . Peripheral neuropathy   . Peripheral vascular disease ([place])   . 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 ABDOMINAL HYSTERECTOMY    . APPENDECTOMY    . BREAST SURGERY     B/L biopsy and lumpectomy   . CARDIAC CATHETERIZATION    . CARPAL TUNNEL RELEASE    . CATARACT EXTRACTION W/ INTRAOCULAR LENS IMPLANT     right eye  . COLONOSCOPY W/ BIOPSIES AND POLYPECTOMY    . CORONARY ARTERY BYPASS GRAFT    . coronary artery stents     at LAD and LIMA  . ECTOPIC PREGNANCY SURGERY    . NASAL SEPTUM SURGERY    . OPEN REDUCTION  INTERNAL FIXATION (ORIF) DISTAL RADIAL FRACTURE Right 05/06/2015   Procedure: OPEN REDUCTION INTERNAL FIXATION (ORIF) RIGHT DISTAL RADIAL FRACTURE AND REPAIRS AS NEEDED;  Surgeon: Iran Planas, MD;  Location: [facility];  Service: Orthopedics;  Laterality: Right;  . ORIF WRIST FRACTURE Left 11/05/2014  . ORIF WRIST FRACTURE Left 11/05/2014   Procedure: OPEN REDUCTION INTERNAL FIXATION (ORIF) LEFT WRIST FRACTURE AND REPAIR AS INDICATED;  Surgeon: Iran Planas, MD;  Location: [facility];  Service: Orthopedics;  Laterality: Left;  . TRIGGER FINGER RELEASE      MEDICATIONS: No current facility-administered medications for this encounter.   . Armodafinil 250 MG tablet  . aspirin EC 325 MG tablet  . carvedilol (COREG) 12.5 MG tablet  . Continuous Blood Gluc Sensor (DEXCOM G6 SENSOR) MISC  . cyclobenzaprine (FLEXERIL) 10 MG tablet  . diclofenac Sodium (VOLTAREN) 1 % GEL  . diphenhydrAMINE (BENADRYL) 25 MG tablet  . diphenoxylate-atropine (LOMOTIL) 2.5-0.025 MG tablet  . esomeprazole (NEXIUM) 20 MG capsule  . estradiol (ESTRACE VAGINAL) 0.1 MG/GM vaginal cream  . fluocinonide (LIDEX) 0.05 % external solution  . gabapentin (NEURONTIN) 100 MG capsule  . Insulin Human (INSULIN PUMP) SOLN  . ketoconazole (NIZORAL) 2 % shampoo  . lidocaine-prilocaine (EMLA) cream  . Melatonin 5 MG CHEW  . nitroGLYCERIN (NITROLINGUAL) 0.4 MG/SPRAY spray  . OVER THE COUNTER MEDICATION  . oxycodone (OXY-IR) 5 MG capsule  . Pancrelipase, Lip-Prot-Amyl, (CREON) 3000-9500 units CPEP  . spironolactone (ALDACTONE) 25 MG tablet  . thyroid (ARMOUR THYROID) 60 MG tablet  . torsemide ([name]) 20 MG tablet  . valACYclovir (VALTREX) 500 MG tablet  . AMBULATORY NON FORMULARY MEDICATION  . [START ON 05/14/2020] Galcanezumab-gnlm (EMGALITY) 120 MG/ML SOAJ     Konrad Felix, PA-C [facility] Pre-Surgical Testing [phone]

## 2020-04-30 ENCOUNTER — Encounter (HOSPITAL_COMMUNITY)
Admission: RE | Disposition: A | Discharge status: Home / Self Care | Payer: Self-pay | Source: Home / Self Care | Attending: Orthopedic Surgery

## 2020-04-30 ENCOUNTER — Ambulatory Visit (HOSPITAL_COMMUNITY): Payer: No Typology Code available for payment source

## 2020-04-30 ENCOUNTER — Ambulatory Visit (HOSPITAL_COMMUNITY): Payer: No Typology Code available for payment source | Admitting: Physician Assistant

## 2020-04-30 ENCOUNTER — Encounter (HOSPITAL_COMMUNITY): Payer: Self-pay | Admitting: Orthopedic Surgery

## 2020-04-30 ENCOUNTER — Ambulatory Visit (HOSPITAL_COMMUNITY)
Admission: RE | Admit: 2020-04-30 | Discharge: 2020-05-01 | Disposition: A | Discharge status: Home / Self Care | Payer: No Typology Code available for payment source | Attending: Orthopedic Surgery | Admitting: Orthopedic Surgery

## 2020-04-30 DIAGNOSIS — M797 Fibromyalgia: Secondary | ICD-10-CM

## 2020-04-30 DIAGNOSIS — Z87891 Personal history of nicotine dependence: Secondary | ICD-10-CM | POA: Diagnosis not present

## 2020-04-30 DIAGNOSIS — Z951 Presence of aortocoronary bypass graft: Secondary | ICD-10-CM | POA: Insufficient documentation

## 2020-04-30 DIAGNOSIS — Z419 Encounter for procedure for purposes other than remedying health state, unspecified: Secondary | ICD-10-CM

## 2020-04-30 DIAGNOSIS — Z803 Family history of malignant neoplasm of breast: Secondary | ICD-10-CM | POA: Diagnosis not present

## 2020-04-30 DIAGNOSIS — Z9181 History of falling: Secondary | ICD-10-CM | POA: Insufficient documentation

## 2020-04-30 DIAGNOSIS — Z881 Allergy status to other antibiotic agents status: Secondary | ICD-10-CM | POA: Insufficient documentation

## 2020-04-30 DIAGNOSIS — D631 Anemia in chronic kidney disease: Secondary | ICD-10-CM | POA: Diagnosis not present

## 2020-04-30 DIAGNOSIS — Z794 Long term (current) use of insulin: Secondary | ICD-10-CM | POA: Insufficient documentation

## 2020-04-30 DIAGNOSIS — Z8781 Personal history of (healed) traumatic fracture: Secondary | ICD-10-CM

## 2020-04-30 DIAGNOSIS — Z880 Allergy status to penicillin: Secondary | ICD-10-CM | POA: Diagnosis not present

## 2020-04-30 DIAGNOSIS — W1830XA Fall on same level, unspecified, initial encounter: Secondary | ICD-10-CM | POA: Insufficient documentation

## 2020-04-30 DIAGNOSIS — Z79899 Other long term (current) drug therapy: Secondary | ICD-10-CM | POA: Diagnosis not present

## 2020-04-30 DIAGNOSIS — Z885 Allergy status to narcotic agent status: Secondary | ICD-10-CM | POA: Insufficient documentation

## 2020-04-30 DIAGNOSIS — Z820 Family history of epilepsy and other diseases of the nervous system: Secondary | ICD-10-CM | POA: Insufficient documentation

## 2020-04-30 DIAGNOSIS — Z8673 Personal history of transient ischemic attack (TIA), and cerebral infarction without residual deficits: Secondary | ICD-10-CM | POA: Diagnosis not present

## 2020-04-30 DIAGNOSIS — N183 Chronic kidney disease, stage 3 unspecified: Secondary | ICD-10-CM | POA: Insufficient documentation

## 2020-04-30 DIAGNOSIS — K219 Gastro-esophageal reflux disease without esophagitis: Secondary | ICD-10-CM | POA: Diagnosis not present

## 2020-04-30 DIAGNOSIS — Z20822 Contact with and (suspected) exposure to covid-19: Secondary | ICD-10-CM | POA: Diagnosis not present

## 2020-04-30 DIAGNOSIS — Z8249 Family history of ischemic heart disease and other diseases of the circulatory system: Secondary | ICD-10-CM | POA: Diagnosis not present

## 2020-04-30 DIAGNOSIS — Z7982 Long term (current) use of aspirin: Secondary | ICD-10-CM | POA: Insufficient documentation

## 2020-04-30 DIAGNOSIS — Z791 Long term (current) use of non-steroidal anti-inflammatories (NSAID): Secondary | ICD-10-CM | POA: Insufficient documentation

## 2020-04-30 DIAGNOSIS — Z886 Allergy status to analgesic agent status: Secondary | ICD-10-CM | POA: Insufficient documentation

## 2020-04-30 DIAGNOSIS — S42201A Unspecified fracture of upper end of right humerus, initial encounter for closed fracture: Secondary | ICD-10-CM | POA: Diagnosis not present

## 2020-04-30 DIAGNOSIS — E1043 Type 1 diabetes mellitus with diabetic autonomic (poly)neuropathy: Secondary | ICD-10-CM | POA: Diagnosis not present

## 2020-04-30 DIAGNOSIS — I130 Hypertensive heart and chronic kidney disease with heart failure and stage 1 through stage 4 chronic kidney disease, or unspecified chronic kidney disease: Secondary | ICD-10-CM | POA: Diagnosis not present

## 2020-04-30 DIAGNOSIS — K3184 Gastroparesis: Secondary | ICD-10-CM | POA: Insufficient documentation

## 2020-04-30 DIAGNOSIS — E10319 Type 1 diabetes mellitus with unspecified diabetic retinopathy without macular edema: Secondary | ICD-10-CM | POA: Insufficient documentation

## 2020-04-30 DIAGNOSIS — I509 Heart failure, unspecified: Secondary | ICD-10-CM | POA: Diagnosis not present

## 2020-04-30 DIAGNOSIS — Z9641 Presence of insulin pump (external) (internal): Secondary | ICD-10-CM | POA: Diagnosis not present

## 2020-04-30 DIAGNOSIS — E1022 Type 1 diabetes mellitus with diabetic chronic kidney disease: Secondary | ICD-10-CM | POA: Diagnosis not present

## 2020-04-30 DIAGNOSIS — Z9889 Other specified postprocedural states: Secondary | ICD-10-CM | POA: Diagnosis present

## 2020-04-30 DIAGNOSIS — Z955 Presence of coronary angioplasty implant and graft: Secondary | ICD-10-CM | POA: Insufficient documentation

## 2020-04-30 DIAGNOSIS — Z888 Allergy status to other drugs, medicaments and biological substances status: Secondary | ICD-10-CM | POA: Insufficient documentation

## 2020-04-30 DIAGNOSIS — E1051 Type 1 diabetes mellitus with diabetic peripheral angiopathy without gangrene: Secondary | ICD-10-CM | POA: Insufficient documentation

## 2020-04-30 DIAGNOSIS — Z882 Allergy status to sulfonamides status: Secondary | ICD-10-CM | POA: Insufficient documentation

## 2020-04-30 HISTORY — DX: Heart failure, unspecified: I50.9

## 2020-04-30 HISTORY — PX: ORIF HUMERUS FRACTURE: SHX2126

## 2020-04-30 LAB — CBC
HCT: 32.1 % — ABNORMAL LOW (ref 36.0–46.0)
Hemoglobin: 9.9 g/dL — ABNORMAL LOW (ref 12.0–15.0)
MCH: 27.3 pg (ref 26.0–34.0)
MCHC: 30.8 g/dL (ref 30.0–36.0)
MCV: 88.4 fL (ref 80.0–100.0)
Platelets: 590 10*3/uL — ABNORMAL HIGH (ref 150–400)
RBC: 3.63 MIL/uL — ABNORMAL LOW (ref 3.87–5.11)
RDW: 15.7 % — ABNORMAL HIGH (ref 11.5–15.5)
WBC: 9.1 10*3/uL (ref 4.0–10.5)
nRBC: 0 % (ref 0.0–0.2)

## 2020-04-30 LAB — GLUCOSE, CAPILLARY
Glucose-Capillary: 167 mg/dL — ABNORMAL HIGH (ref 70–99)
Glucose-Capillary: 160 mg/dL — ABNORMAL HIGH (ref 70–99)

## 2020-04-30 LAB — SARS CORONAVIRUS 2 BY RT PCR (HOSPITAL ORDER, PERFORMED IN ~~LOC~~ HOSPITAL LAB): SARS Coronavirus 2: NEGATIVE

## 2020-04-30 LAB — HEMOGLOBIN A1C
Hgb A1c MFr Bld: 8.1 % — ABNORMAL HIGH (ref 4.8–5.6)
Mean Plasma Glucose: 185.77 mg/dL

## 2020-04-30 SURGERY — OPEN REDUCTION INTERNAL FIXATION (ORIF) PROXIMAL HUMERUS FRACTURE
Anesthesia: General | Laterality: Right

## 2020-04-30 MED ORDER — SODIUM CHLORIDE 0.9 % IR SOLN
Status: DC | PRN
  Administered 2020-04-30: 1000 mL

## 2020-04-30 MED ORDER — POLYETHYLENE GLYCOL 3350 17 G PO PACK: 17.0000 g | PACK | Freq: Every day | ORAL | Status: DC | PRN

## 2020-04-30 MED ORDER — FENTANYL CITRATE (PF) 100 MCG/2ML IJ SOLN: 25.0000 ug | INTRAMUSCULAR | Status: DC | PRN

## 2020-04-30 MED ORDER — ACETAMINOPHEN 325 MG PO TABS: 325.0000 mg | ORAL_TABLET | Freq: Four times a day (QID) | ORAL | Status: DC | PRN

## 2020-04-30 MED ORDER — METHOCARBAMOL 500 MG IVPB - SIMPLE MED
500.0000 mg | Freq: Four times a day (QID) | INTRAVENOUS | Status: DC | PRN
  Filled 2020-04-30: qty 50

## 2020-04-30 MED ORDER — ONDANSETRON HCL 4 MG/2ML IJ SOLN
4.0000 mg | Freq: Four times a day (QID) | INTRAMUSCULAR | Status: DC | PRN
Start: 2020-04-30 — End: 2020-04-30

## 2020-04-30 MED ORDER — ROCURONIUM BROMIDE 10 MG/ML (PF) SYRINGE
PREFILLED_SYRINGE | INTRAVENOUS | Status: AC
  Filled 2020-04-30: qty 10

## 2020-04-30 MED ORDER — SUGAMMADEX SODIUM 200 MG/2ML IV SOLN
INTRAVENOUS | Status: DC | PRN
  Administered 2020-04-30: 100 mg via INTRAVENOUS

## 2020-04-30 MED ORDER — MELATONIN 3 MG PO TABS
3.0000 mg | ORAL_TABLET | Freq: Every day | ORAL | Status: DC
  Administered 2020-04-30: 3 mg via ORAL
  Filled 2020-04-30: qty 1

## 2020-04-30 MED ORDER — MAGNESIUM CITRATE PO SOLN: 1.0000 | Freq: Once | ORAL | Status: DC | PRN

## 2020-04-30 MED ORDER — MIDAZOLAM HCL 2 MG/2ML IJ SOLN
1.0000 mg | INTRAMUSCULAR | Status: DC
  Filled 2020-04-30: qty 2

## 2020-04-30 MED ORDER — PHENYLEPHRINE HCL-NACL 10-0.9 MG/250ML-% IV SOLN
INTRAVENOUS | Status: DC | PRN
  Administered 2020-04-30: 20 ug/min via INTRAVENOUS

## 2020-04-30 MED ORDER — ONDANSETRON HCL 4 MG PO TABS: 4.0000 mg | ORAL_TABLET | Freq: Four times a day (QID) | ORAL | Status: DC | PRN

## 2020-04-30 MED ORDER — THYROID 60 MG PO TABS
60.0000 mg | ORAL_TABLET | Freq: Every day | ORAL | Status: DC
  Filled 2020-04-30: qty 1

## 2020-04-30 MED ORDER — ASPIRIN EC 325 MG PO TBEC: 325.0000 mg | DELAYED_RELEASE_TABLET | Freq: Every day | ORAL | Status: DC

## 2020-04-30 MED ORDER — OXYCODONE HCL 5 MG PO TABS
10.0000 mg | ORAL_TABLET | ORAL | Status: DC | PRN
Start: 2020-04-30 — End: 2020-05-01
  Administered 2020-04-30 – 2020-05-01 (×2): 10 mg via ORAL
  Filled 2020-04-30 (×2): qty 2
  Filled 2020-04-30: qty 3

## 2020-04-30 MED ORDER — TRANEXAMIC ACID-NACL 1000-0.7 MG/100ML-% IV SOLN
1000.0000 mg | INTRAVENOUS | Status: AC
  Administered 2020-04-30: 1000 mg via INTRAVENOUS
  Filled 2020-04-30: qty 100

## 2020-04-30 MED ORDER — LIDOCAINE 2% (20 MG/ML) 5 ML SYRINGE
INTRAMUSCULAR | Status: AC
  Filled 2020-04-30: qty 5

## 2020-04-30 MED ORDER — INSULIN PUMP
SUBCUTANEOUS | Status: DC
  Filled 2020-04-30: qty 1

## 2020-04-30 MED ORDER — NITROGLYCERIN 0.4 MG/SPRAY TL SOLN
1.0000 | Status: DC | PRN
  Filled 2020-04-30: qty 12

## 2020-04-30 MED ORDER — DOCUSATE SODIUM 100 MG PO CAPS
100.0000 mg | ORAL_CAPSULE | Freq: Two times a day (BID) | ORAL | Status: DC
  Administered 2020-04-30: 100 mg via ORAL
  Filled 2020-04-30 (×2): qty 1

## 2020-04-30 MED ORDER — OXYCODONE HCL 5 MG/5ML PO SOLN: 5.0000 mg | Freq: Once | ORAL | Status: AC | PRN

## 2020-04-30 MED ORDER — ORAL CARE MOUTH RINSE: 15.0000 mL | Freq: Once | OROMUCOSAL | Status: AC

## 2020-04-30 MED ORDER — VANCOMYCIN HCL 1000 MG IV SOLR
INTRAVENOUS | Status: AC
  Filled 2020-04-30: qty 1000

## 2020-04-30 MED ORDER — ONDANSETRON HCL 4 MG/2ML IJ SOLN
4.0000 mg | Freq: Four times a day (QID) | INTRAMUSCULAR | Status: DC | PRN
  Administered 2020-05-01: 4 mg via INTRAVENOUS
  Filled 2020-04-30: qty 2

## 2020-04-30 MED ORDER — METOCLOPRAMIDE HCL 5 MG/ML IJ SOLN: 5.0000 mg | Freq: Three times a day (TID) | INTRAMUSCULAR | Status: DC | PRN

## 2020-04-30 MED ORDER — GABAPENTIN 100 MG PO CAPS
100.0000 mg | ORAL_CAPSULE | Freq: Two times a day (BID) | ORAL | Status: DC
Start: 2020-04-30 — End: 2020-05-01
  Filled 2020-04-30: qty 2

## 2020-04-30 MED ORDER — ONDANSETRON HCL 4 MG/2ML IJ SOLN
INTRAMUSCULAR | Status: AC
  Filled 2020-04-30: qty 2

## 2020-04-30 MED ORDER — BUPIVACAINE HCL (PF) 0.5 % IJ SOLN
INTRAMUSCULAR | Status: DC | PRN
  Administered 2020-04-30: 10 mL via PERINEURAL

## 2020-04-30 MED ORDER — CEFAZOLIN SODIUM-DEXTROSE 2-4 GM/100ML-% IV SOLN
2.0000 g | INTRAVENOUS | Status: AC
  Administered 2020-04-30: 2 g via INTRAVENOUS
  Filled 2020-04-30: qty 100

## 2020-04-30 MED ORDER — LIDOCAINE 2% (20 MG/ML) 5 ML SYRINGE
INTRAMUSCULAR | Status: DC | PRN
  Administered 2020-04-30: 40 mg via INTRAVENOUS
  Administered 2020-04-30: 60 mg via INTRAVENOUS

## 2020-04-30 MED ORDER — OXYCODONE HCL 5 MG PO TABS
5.0000 mg | ORAL_TABLET | Freq: Once | ORAL | Status: AC | PRN
Start: 2020-04-30 — End: 2020-04-30

## 2020-04-30 MED ORDER — OXYCODONE HCL 5 MG PO TABS
ORAL_TABLET | ORAL | Status: AC
  Administered 2020-04-30: 5 mg via ORAL
  Filled 2020-04-30: qty 1

## 2020-04-30 MED ORDER — DIPHENHYDRAMINE HCL 12.5 MG/5ML PO ELIX: 12.5000 mg | ORAL_SOLUTION | ORAL | Status: DC | PRN

## 2020-04-30 MED ORDER — DEXAMETHASONE SODIUM PHOSPHATE 10 MG/ML IJ SOLN
INTRAMUSCULAR | Status: DC | PRN
  Administered 2020-04-30: 5 mg via INTRAVENOUS

## 2020-04-30 MED ORDER — HYDROMORPHONE HCL 1 MG/ML IJ SOLN
0.5000 mg | INTRAMUSCULAR | Status: DC | PRN
  Administered 2020-05-01 (×2): 1 mg via INTRAVENOUS
  Filled 2020-04-30 (×2): qty 1

## 2020-04-30 MED ORDER — ARMODAFINIL 250 MG PO TABS: 250.0000 mg | ORAL_TABLET | Freq: Every day | ORAL | Status: DC

## 2020-04-30 MED ORDER — PROPOFOL 10 MG/ML IV BOLUS
INTRAVENOUS | Status: AC
  Filled 2020-04-30: qty 20

## 2020-04-30 MED ORDER — PHENOL 1.4 % MT LIQD
1.0000 | OROMUCOSAL | Status: DC | PRN
Start: 2020-04-30 — End: 2020-05-01
  Administered 2020-04-30: 1 via OROMUCOSAL
  Filled 2020-04-30: qty 177

## 2020-04-30 MED ORDER — ROCURONIUM BROMIDE 10 MG/ML (PF) SYRINGE
PREFILLED_SYRINGE | INTRAVENOUS | Status: DC | PRN
  Administered 2020-04-30: 20 mg via INTRAVENOUS
  Administered 2020-04-30: 50 mg via INTRAVENOUS

## 2020-04-30 MED ORDER — ONDANSETRON HCL 4 MG/2ML IJ SOLN
INTRAMUSCULAR | Status: DC | PRN
  Administered 2020-04-30: 4 mg via INTRAVENOUS

## 2020-04-30 MED ORDER — MENTHOL 3 MG MT LOZG: 1.0000 | LOZENGE | OROMUCOSAL | Status: DC | PRN

## 2020-04-30 MED ORDER — METOCLOPRAMIDE HCL 5 MG PO TABS: 5.0000 mg | ORAL_TABLET | Freq: Three times a day (TID) | ORAL | Status: DC | PRN

## 2020-04-30 MED ORDER — BISACODYL 5 MG PO TBEC: 5.0000 mg | DELAYED_RELEASE_TABLET | Freq: Every day | ORAL | Status: DC | PRN

## 2020-04-30 MED ORDER — METHOCARBAMOL 500 MG PO TABS
500.0000 mg | ORAL_TABLET | Freq: Four times a day (QID) | ORAL | Status: DC | PRN
  Administered 2020-04-30 – 2020-05-01 (×2): 500 mg via ORAL
  Filled 2020-04-30 (×2): qty 1

## 2020-04-30 MED ORDER — OXYCODONE HCL 5 MG PO TABS: 5.0000 mg | ORAL_TABLET | ORAL | Status: DC | PRN

## 2020-04-30 MED ORDER — CHLORHEXIDINE GLUCONATE 0.12 % MT SOLN
15.0000 mL | Freq: Once | OROMUCOSAL | Status: AC
  Administered 2020-04-30: 15 mL via OROMUCOSAL

## 2020-04-30 MED ORDER — LACTATED RINGERS IV SOLN: INTRAVENOUS | Status: DC

## 2020-04-30 MED ORDER — FENTANYL CITRATE (PF) 100 MCG/2ML IJ SOLN
50.0000 ug | INTRAMUSCULAR | Status: DC
  Administered 2020-04-30 (×2): 50 ug via INTRAVENOUS
  Filled 2020-04-30: qty 2

## 2020-04-30 MED ORDER — CARVEDILOL 12.5 MG PO TABS
12.5000 mg | ORAL_TABLET | Freq: Two times a day (BID) | ORAL | Status: DC
  Administered 2020-05-01: 12.5 mg via ORAL
  Filled 2020-04-30: qty 1

## 2020-04-30 MED ORDER — BUPIVACAINE LIPOSOME 1.3 % IJ SUSP
INTRAMUSCULAR | Status: DC | PRN
  Administered 2020-04-30: 10 mL via PERINEURAL

## 2020-04-30 MED ORDER — PROPOFOL 10 MG/ML IV BOLUS
INTRAVENOUS | Status: DC | PRN
  Administered 2020-04-30: 120 mg via INTRAVENOUS
  Administered 2020-04-30 (×2): 30 mg via INTRAVENOUS

## 2020-04-30 MED ORDER — SPIRONOLACTONE 25 MG PO TABS
50.0000 mg | ORAL_TABLET | Freq: Every day | ORAL | Status: DC
  Filled 2020-04-30: qty 2

## 2020-04-30 SURGICAL SUPPLY — 63 items
BAG ZIPLOCK 12X15 (MISCELLANEOUS) ×2 IMPLANT
BIT DRILL 2.5X110 QC LCP DISP (BIT) ×2 IMPLANT
BIT DRILL PERC QC 2.8X200 100 (BIT) ×1 IMPLANT
BNDG ELASTIC 3X5.8 VLCR STR LF (GAUZE/BANDAGES/DRESSINGS) ×2 IMPLANT
COOLER ICEMAN CLASSIC (MISCELLANEOUS) ×2 IMPLANT
COVER SURGICAL LIGHT HANDLE (MISCELLANEOUS) ×2 IMPLANT
COVER WAND RF STERILE (DRAPES) IMPLANT
DERMABOND ADVANCED (GAUZE/BANDAGES/DRESSINGS) ×1
DERMABOND ADVANCED .7 DNX12 (GAUZE/BANDAGES/DRESSINGS) ×1 IMPLANT
DRAPE C-ARM 42X120 X-RAY (DRAPES) ×2 IMPLANT
DRAPE C-ARMOR (DRAPES) ×2 IMPLANT
DRAPE ORTHO SPLIT 77X108 STRL (DRAPES) ×2
DRAPE SURG 17X11 SM STRL (DRAPES) ×2 IMPLANT
DRAPE SURG ORHT 6 SPLT 77X108 (DRAPES) ×2 IMPLANT
DRAPE U-SHAPE 47X51 STRL (DRAPES) ×2 IMPLANT
DRILL BIT QUICK COUP 2.8MM 100 (BIT) ×1
DRSG AQUACEL AG ADV 3.5X 6 (GAUZE/BANDAGES/DRESSINGS) IMPLANT
DRSG AQUACEL AG ADV 3.5X10 (GAUZE/BANDAGES/DRESSINGS) ×2 IMPLANT
DURAPREP 26ML APPLICATOR (WOUND CARE) ×2 IMPLANT
ELECT BLADE TIP CTD 4 INCH (ELECTRODE) ×2 IMPLANT
ELECT REM PT RETURN 15FT ADLT (MISCELLANEOUS) ×2 IMPLANT
GLOVE SURG ENC MOIS LTX SZ7 (GLOVE) ×2 IMPLANT
GLOVE SURG ENC MOIS LTX SZ8 (GLOVE) ×2 IMPLANT
GLOVE SURG MICRO LTX SZ7 (GLOVE) ×2 IMPLANT
GLOVE SURG MICRO LTX SZ7.5 (GLOVE) ×2 IMPLANT
GOWN STRL REUS W/TWL LRG LVL3 (GOWN DISPOSABLE) ×4 IMPLANT
KIT BASIN OR (CUSTOM PROCEDURE TRAY) ×2 IMPLANT
KIT TURNOVER KIT A (KITS) ×2 IMPLANT
MANIFOLD NEPTUNE II (INSTRUMENTS) ×2 IMPLANT
NEEDLE TAPERED W/ NITINOL LOOP (MISCELLANEOUS) IMPLANT
NS IRRIG 1000ML POUR BTL (IV SOLUTION) ×2 IMPLANT
PACK SHOULDER (CUSTOM PROCEDURE TRAY) ×2 IMPLANT
PAD ARMBOARD 7.5X6 YLW CONV (MISCELLANEOUS) ×4 IMPLANT
PAD COLD SHLDR UNI WRAP-ON (PAD) ×2
PAD COLD UNI WRAP-ON (PAD) ×1 IMPLANT
PLATE PROX HUM LCP 3.5X142 5H (Plate) ×2 IMPLANT
PROTECTOR NERVE ULNAR (MISCELLANEOUS) ×2 IMPLANT
RESTRAINT HEAD UNIVERSAL NS (MISCELLANEOUS) ×2 IMPLANT
SCREW CORTEX 3.5 12MM (Screw) ×1 IMPLANT
SCREW CORTEX 3.5 22MM (Screw) ×1 IMPLANT
SCREW LOCK CORT ST 3.5X12 (Screw) ×1 IMPLANT
SCREW LOCK CORT ST 3.5X22 (Screw) ×1 IMPLANT
SCREW LOCK T15 FT 22X3.5XST (Screw) ×1 IMPLANT
SCREW LOCK T15 FT 32X3.5X2.9X (Screw) ×3 IMPLANT
SCREW LOCK T15 FT 40X3.5XST (Screw) ×3 IMPLANT
SCREW LOCKING 3.5X22 (Screw) ×1 IMPLANT
SCREW LOCKING 3.5X26 (Screw) ×2 IMPLANT
SCREW LOCKING 3.5X32 (Screw) ×3 IMPLANT
SCREW LOCKING 3.5X34 (Screw) ×2 IMPLANT
SCREW LOCKING 3.5X40 (Screw) ×3 IMPLANT
SLING ARM FOAM STRAP LRG (SOFTGOODS) IMPLANT
SLING ARM FOAM STRAP MED (SOFTGOODS) ×2 IMPLANT
SLING ARM FOAM STRAP SML (SOFTGOODS) ×2 IMPLANT
SUCTION FRAZIER HANDLE 12FR (TUBING) ×1
SUCTION TUBE FRAZIER 12FR DISP (TUBING) ×1 IMPLANT
SUT FIBERWIRE #2 38 T-5 BLUE (SUTURE)
SUT MNCRL AB 3-0 PS2 18 (SUTURE) ×2 IMPLANT
SUT MON AB 2-0 CT1 36 (SUTURE) ×2 IMPLANT
SUT VIC AB 1 CT1 36 (SUTURE) ×2 IMPLANT
SUTURE FIBERWR #2 38 T-5 BLUE (SUTURE) IMPLANT
TOWEL OR 17X26 10 PK STRL BLUE (TOWEL DISPOSABLE) ×2 IMPLANT
TOWEL OR NON WOVEN STRL DISP B (DISPOSABLE) ×2 IMPLANT
YANKAUER SUCT BULB TIP 10FT TU (MISCELLANEOUS) ×2 IMPLANT

## 2020-04-30 NOTE — Anesthesia Procedure Notes (Signed)
Anesthesia Regional Block: Interscalene brachial plexus block   Pre-Anesthetic Checklist: ,, timeout performed, Correct Patient, Correct Site, Correct Laterality, Correct Procedure, Correct Position, site marked, Risks and benefits discussed,  Surgical consent,  Pre-op evaluation,  At surgeon's request and post-op pain management  Laterality: Right  Prep: chloraprep       Needles:  Injection technique: Single-shot  Needle Type: Echogenic Stimulator Needle     Needle Length: 5cm  Needle Gauge: 22     Additional Needles:   Procedures:, nerve stimulator,,,,,,,   Nerve Stimulator or Paresthesia:  Response: biceps flexion, 0.45 mA,   Additional Responses:   Narrative:  Start time: 04/30/2020 2:23 PM End time: 04/30/2020 2:33 PM Injection made incrementally with aspirations every 5 mL.  Performed by: Personally  Anesthesiologist: Albertha Ghee, MD  Additional Notes: Functioning IV was confirmed and monitors were applied.  A 57mm 22ga Arrow echogenic stimulator needle was used. Sterile prep and drape,hand hygiene and sterile gloves were used.  Negative aspiration and negative test dose prior to incremental administration of local anesthetic. The patient tolerated the procedure well.  Ultrasound guidance: relevent anatomy identified, needle position confirmed, local anesthetic spread visualized around nerve(s), vascular puncture avoided.  Image printed for medical record.

## 2020-04-30 NOTE — Op Note (Signed)
04/30/2020  6:39 PM  PATIENT:   Brittney Pace  65 y.o. female  PRE-OPERATIVE DIAGNOSIS: Comminuted, displaced right proximal humerus fracture  POST-OPERATIVE DIAGNOSIS: Same  PROCEDURE: Open reduction and internal fixation of comminuted and displaced right proximal humerus fracture  SURGEON:  Levis Nazir, Metta Clines M.D.  ASSISTANTS: Jenetta Loges, PA-C  ANESTHESIA:   General endotracheal and interscalene block with Exparel  EBL: 200 cc  SPECIMEN: None  Drains: None   PATIENT DISPOSITION:  PACU - hemodynamically stable.    PLAN OF CARE: Admit for overnight observation  Brief history:  Patient is a 65 year old female who sustained a ground-level mechanical fall this past weekend severely injuring her right shoulder.  She was seen at outside urgent care facility where x-rays were obtained showing a comminuted proximal humerus fracture.  She presented to our office yesterday for follow-up with repeat x-rays confirming a significantly comminuted, displaced, and angulated proximal humeral metaphyseal fracture.  Due to the degree of comminution displacement and angulation she is brought to the operating room this time for planned open reduction and internal fixation.  Preoperatively, I counseled the patient regarding treatment options and risks versus benefits thereof.  Possible surgical complications were all reviewed including potential for bleeding, infection, neurovascular injury, persistent pain, loss of motion, anesthetic complication, failure of the implant,, nonunion, and possible need for additional surgery. They understand and accept and agrees with our planned procedure.  Procedure detail:  After undergoing routine preop evaluation the patient received prophylactic antibiotics and interscalene block with Exparel was established in the holding area by the anesthesia department.  Patient was subsequently placed supine on the operating table and underwent the smooth induction of a  general endotracheal anesthesia.  Subsequently placed into the beachchair position and appropriately padded and protected.  Fluoroscopic imaging was then utilized to confirm we could obtain proper visualization of the right shoulder and humerus.  The right shoulder girdle region was then sterilely prepped and draped in standard fashion.  Timeout was called.  A deltopectoral approach to the right shoulder on the right anterior humerus was made through a standard deltopectoral and proximal Henry's approach.  Dissection carried deeply with the cephalic vein taken laterally with the deltoid and then distally the interval was carried along the anterior margin of the humeral shaft with some elevation of the anterior insertion of the deltoid and retracted bicep medially and then split in the brachialis along its midline gaining proper visualization of the humeral shaft and then dissection proximally beneath the deltoid identifying the fracture site and mobilizing the fracture fragments and ultimately exposing the fracture site circumferentially.  There was significant combination of the 2 dominant pieces that we carefully maintained in utilizing bone clamps were able to manipulate the proximal distal segments and then provisionally hold the fracture with the clamps.  We then selected the appropriate Synthes proximal humeral plate which was an 8 hole and this was then placed over the anterolateral margin of the humeral shaft traversing the fracture site we used fluoroscopic imaging to then confirmed that we were satisfied with the overall position of the plate.  Once proper positioning was accomplished the plate was then provisionally fastened distally with a single screw within the proximal screws were placed up into the humeral head and then humeral fixation along the shaft all which obtained good fixation of purchase.  Final fluoroscopic imaging showed good position of the hardware and good alignment of the fracture site  and live fluoroscopy was used to confirm that the screws  within the head were all properly seated and maintained within the bony contours.  At this point final irrigation was completed.  Hemostasis was obtained.  The deep fascial layers of deltopectoral interval was reapproximated with a series of figure-of-eight and 1 Vicryl sutures.  2-0 Monocryl used for subcu layer and intracuticular 3-0 Monocryl for the skin followed by Dermabond and Aquacel dressing.  The right arm was then placed into a sling and the patient was awakened, extubated, and taken to the recovery room in stable condition.  Jenetta Loges, PA-C was utilized as an Environmental consultant throughout this case, essential for help with positioning the patient, positioning extremity, tissue manipulation, implantation of the prosthesis, suture management, wound closure, and intraoperative decision-making.  Marin Shutter MD    Contact # [phone]

## 2020-04-30 NOTE — Transfer of Care (Signed)
Immediate Anesthesia Transfer of Care Note  Patient: Brittney Pace  Procedure(s) Performed: OPEN REDUCTION INTERNAL FIXATION (ORIF) PROXIMAL HUMERUS FRACTURE (Right )  Patient Location: PACU  Anesthesia Type:General  Level of Consciousness: awake  Airway & Oxygen Therapy: Patient Spontanous Breathing and Patient connected to face mask oxygen  Post-op Assessment: Report given to RN and Post -op Vital signs reviewed and stable  Post vital signs: Reviewed and stable  Last Vitals:  Vitals Value Taken Time  BP 165/80 04/30/20 1845  Temp    Pulse 58 04/30/20 1848  Resp 13 04/30/20 1848  SpO2 100 % 04/30/20 1848  Vitals shown include unvalidated device data.  Last Pain:  Vitals:   04/30/20 1445  TempSrc:   PainSc: 0-No pain      Patients Stated Pain Goal: 3 (06/25/54 1537)  Complications: No complications documented.

## 2020-04-30 NOTE — Progress Notes (Signed)
Assisted Dr. Hodierne with right, ultrasound guided, interscalene  block. Side rails up, monitors on throughout procedure. See vital signs in flow sheet. Tolerated Procedure well. 

## 2020-04-30 NOTE — Progress Notes (Signed)
Inpatient Diabetes Program Recommendations  AACE/ADA: New Consensus Statement on Inpatient Glycemic Control (2015)  Target Ranges:  Prepandial:   less than 140 mg/dL      Peak postprandial:   less than 180 mg/dL (1-2 hours)      Critically ill patients:  140 - 180 mg/dL   Lab Results  Component Value Date   GLUCAP 167 (H) 04/30/2020   HGBA1C 7.8% 05/20/2019    Review of Glycemic Control  Diabetes history: type 1? Outpatient Diabetes medications: Insulin pump Current orders for Inpatient glycemic control:   Inpatient Diabetes Program Recommendations:   Periop staff RN called to review the patient's use of insulin pump. Patient stated that she has her insulin pump on and is able to manage it at this time. Noted that admission blood sugar is 167 mg/dl. Surgery is to last 1 1/2 to 2 hours. Will continue to monitor blood sugars if admitted to the hospital.   Harvel Ricks RN BSN CDE Diabetes Coordinator Pager: [phone]  8am-5pm

## 2020-04-30 NOTE — Progress Notes (Addendum)
Spoke with [facility] Diabetic Coordinator in regards to pts arrival. Discussed insulin pump; informed pt states has in sleep mode currently blood glucose ranging between 140's and 160's. Pt states MD that monitors pump did not contact her prior to surgery date but pt states she is aware how to control.

## 2020-04-30 NOTE — Progress Notes (Signed)
Patient a/o x4; called to patients room related to o@ monitor showing decrease O2 sat; O2 sat 88% upon arrival to room; patient complaining of rt shoulder pain rating 7/10; patient complains of being cold; hands and feet cold to touch; denies SOB; denies chest pain; patient states, "I tend to hold my breathe when I hurt." patient encouraged to take deep breaths; IS instructed; IS done by patient up to 500; O2 @ 3L via [place]; O2 sat 97%. Patient is on continuous O2 monitor. Patient encouraged to cough and deep breathe. Patient verbalizes understanding. O2 sat probe changed by RN. Primary RN to call with any further patient needs.

## 2020-04-30 NOTE — Anesthesia Procedure Notes (Signed)
Procedure Name: Intubation Performed by: Milford Cage, CRNA Pre-anesthesia Checklist: Patient identified, Emergency Drugs available, Suction available and Patient being monitored Patient Re-evaluated:Patient Re-evaluated prior to induction Oxygen Delivery Method: Circle System Utilized Preoxygenation: Pre-oxygenation with 100% oxygen Induction Type: IV induction Ventilation: Mask ventilation without difficulty Laryngoscope Size: Miller and 2 Grade View: Grade II Tube type: Oral Tube size: 7.0 mm Number of attempts: 1 Airway Equipment and Method: Stylet Placement Confirmation: ETT inserted through vocal cords under direct vision,  positive ETCO2 and breath sounds checked- equal and bilateral Secured at: 20 cm Tube secured with: Tape Dental Injury: Teeth and Oropharynx as per pre-operative assessment

## 2020-04-30 NOTE — H&P (Signed)
Brittney Pace    Chief Complaint: Right proximal humerus fracture HPI: The patient is a 65 y.o. female status post mechanical ground-level fall sustaining a severely comminuted and displaced proximal humeral metaphyseal fracture.  Due to the degree of instability and displacement she is brought to the operating this time for planned open reduction and internal fixation.  Past Medical History:  Diagnosis Date  . Anemia   . Arthritis   . 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 CHF (congestive heart failure) ([place])   . Chronic kidney disease    stage 3  . Coronary artery disease   . Depression   . Diabetes mellitus without complication (HCC)    Type 1  . Diabetic retinopathy ([place])   . 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 Family history of adverse reaction to anesthesia    mother: " while she was under she stopped breathing."  . Fibromyalgia   . Gastroparesis   . GERD (gastroesophageal reflux disease)   . Headache    migraines  . Hypothyroidism   . IBS (irritable bowel syndrome)   . Idiopathic edema   . Iron deficiency anemia 09/04/2019  . Lyme disease   . Mitral valve prolapse   . Myocardial infarction ([place])    1 major in 1999 and 2 minor " small vessel disease."  . Osteoporosis   . Peripheral neuropathy   . Peripheral vascular disease ([place])   . 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 ABDOMINAL HYSTERECTOMY    . APPENDECTOMY    . BREAST SURGERY     B/L biopsy and lumpectomy   . CARDIAC CATHETERIZATION    . CARPAL TUNNEL RELEASE    . CATARACT EXTRACTION W/ INTRAOCULAR LENS IMPLANT     right eye  . COLONOSCOPY W/ BIOPSIES AND POLYPECTOMY    . CORONARY ARTERY BYPASS GRAFT    . coronary artery stents     at [facility] and [place]  . ECTOPIC PREGNANCY SURGERY    . NASAL SEPTUM SURGERY    . OPEN  REDUCTION INTERNAL FIXATION (ORIF) DISTAL RADIAL FRACTURE Right 05/06/2015   Procedure: OPEN REDUCTION INTERNAL FIXATION (ORIF) RIGHT DISTAL RADIAL FRACTURE AND REPAIRS AS NEEDED;  Surgeon: Iran Planas, MD;  Location: [place];  Service: Orthopedics;  Laterality: Right;  . ORIF WRIST FRACTURE Left 11/05/2014  . ORIF WRIST FRACTURE Left 11/05/2014   Procedure: OPEN REDUCTION INTERNAL FIXATION (ORIF) LEFT WRIST FRACTURE AND REPAIR AS INDICATED;  Surgeon: Iran Planas, MD;  Location: [place];  Service: Orthopedics;  Laterality: Left;  . TRIGGER FINGER RELEASE      Family History  Problem Relation Age of Onset  . Breast cancer Mother   . Migraines Mother   . Heart disease Father   . Hypertension Father   . Breast cancer Sister     Social History:  reports that she has quit smoking. Her smoking use included cigarettes. She has never used smokeless tobacco. She reports current alcohol use. She reports that she does not use drugs.   Medications Prior to Admission  Medication Sig Dispense Refill  . Armodafinil 250 MG tablet Take 1 tablet (250 mg total) by mouth daily. OR can take 0.5 tablet (125 mg) po bid (Patient taking differently: Take 250 mg by mouth  daily.) 90 tablet 1  . aspirin EC 325 MG tablet Take 1 tablet (325 mg total) by mouth daily. (Patient taking differently: Take 325 mg by mouth at bedtime.) 30 tablet 0  . carvedilol (COREG) 12.5 MG tablet Take 1 tablet (12.5 mg total) by mouth 2 (two) times daily with a meal. 180 tablet 3  . Continuous Blood Gluc Sensor (DEXCOM G6 SENSOR) MISC USE TO CONTINUOUSLY MONITOR BLOOD SUGARS. CHANGE EVERY 10 DAYS    . diphenhydrAMINE (BENADRYL) 25 MG tablet Take 25 mg by mouth at bedtime.    . diphenoxylate-atropine (LOMOTIL) 2.5-0.025 MG tablet TAKE 1 TABLET BY MOUTH 4 TIMES DAILY AS NEEDED FOR  DIARRHEA  OR  LOOSE  STOOLS (Patient taking differently: Take 1 tablet by mouth daily as needed for diarrhea or loose stools.) 90 tablet 0  . esomeprazole (NEXIUM)  20 MG capsule Take 20 mg by mouth every morning.     Marland Kitchen estradiol (ESTRACE VAGINAL) 0.1 MG/GM vaginal cream Place 1 Applicatorful vaginally at bedtime for 7 days, THEN 1 Applicatorful 3 (three) times a week. 42.5 g 12  . fluocinonide (LIDEX) 0.05 % external solution Apply 1 application topically See admin instructions. Once every three days    . gabapentin (NEURONTIN) 100 MG capsule Take 1-2 capsules (100-200 mg total) by mouth 3 (three) times daily as needed. (Patient taking differently: Take 100-200 mg by mouth 2 (two) times daily. Pain and headache) 180 capsule 5  . Insulin Human (INSULIN PUMP) SOLN Inject into the skin continuous. Novolog insulin    . ketoconazole (NIZORAL) 2 % shampoo APPLY 1 APPLICATION TOPICALLY 2 (TWO) TIMES A WEEK. 240 mL 1  . Melatonin 5 MG CHEW Chew 5 mg by mouth daily as needed (Sleep).    . nitroGLYCERIN (NITROLINGUAL) 0.4 MG/SPRAY spray Place 1 spray under the tongue every 5 (five) minutes x 3 doses as needed for chest pain. 12 g 1  . OVER THE COUNTER MEDICATION as directed. Magnesium L-Treonate    . oxycodone (OXY-IR) 5 MG capsule Take 5 mg by mouth every 4 (four) hours as needed.    . Pancrelipase, Lip-Prot-Amyl, (CREON) 3000-9500 units CPEP Take 2 capsules by mouth 2 (two) times daily with a meal. Amylase 15000 units    . spironolactone (ALDACTONE) 25 MG tablet Take 50 mg by mouth daily.    Marland Kitchen thyroid (ARMOUR THYROID) 60 MG tablet Take 1 tablet (60 mg total) by mouth daily before breakfast. 90 tablet 3  . AMBULATORY NON FORMULARY MEDICATION Incontinence pads per patient preference, #unlimited, refill x99 Fax to [phone] 1 Units prn  . cyclobenzaprine (FLEXERIL) 10 MG tablet Take 0.5-1 tablets (5-10 mg total) by mouth 3 (three) times daily as needed for muscle spasms. Caution: can cause drowsiness (Patient taking differently: Take 10 mg by mouth 3 (three) times daily as needed for muscle spasms. Caution: can cause drowsiness) 60 tablet 1  . diclofenac Sodium  (VOLTAREN) 1 % GEL Apply 2 g topically daily as needed (Pain).    Derrill Memo ON 05/14/2020] Galcanezumab-gnlm (EMGALITY) 120 MG/ML SOAJ Inject 120 mg into the skin every 30 (thirty) days. 1 mL 5  . lidocaine-prilocaine (EMLA) cream Apply 1 application topically every 2 (two) hours as needed. Please run with good Rx discount coupon 30 g 0  . torsemide ([name]) 20 MG tablet TAKE 1 TABLET ONCE DAILY INTHE MORNING. MAY TAKE AN   EXTRA 1/2 TABLET AS        NEEDED (Patient taking differently: Take 20 mg by mouth  daily as needed [employer] retention).) 135 tablet 0  . valACYclovir (VALTREX) 500 MG tablet Take 500 mg by mouth daily as needed (Flair up).       Physical Exam: Right shoulder demonstrates diffuse swelling with evolving ecchymosis.  The skin is intact.  She is intact to light touch sensation in axillary nerve distribution and is grossly neurovascular intact distally in the right upper extremity.  Plain radiographs confirm a significantly comminuted and angulated proximal humeral metaphyseal fracture.  Vitals  Temp:  [97.6 F (36.4 C)] 97.6 F (36.4 C) (04/07 1220) Pulse Rate:  [71] 71 (04/07 1220) Resp:  [19] 19 (04/07 1220) BP: (176)/(75) 176/75 (04/07 1220) SpO2:  [99 %] 99 % (04/07 1220)  Assessment/Plan  Impression: Right proximal humerus fracture  Plan of Action: Procedure(s): OPEN REDUCTION INTERNAL FIXATION (ORIF) PROXIMAL HUMERUS FRACTURE  Haedyn Breau M Sanja Elizardo 04/30/2020, 4:01 PM Contact # [phone]

## 2020-05-01 DIAGNOSIS — S42201A Unspecified fracture of upper end of right humerus, initial encounter for closed fracture: Secondary | ICD-10-CM | POA: Diagnosis not present

## 2020-05-01 LAB — GLUCOSE, CAPILLARY: Glucose-Capillary: 131 mg/dL — ABNORMAL HIGH (ref 70–99)

## 2020-05-01 MED ORDER — PROMETHAZINE HCL 25 MG PO TABS
25.0000 mg | ORAL_TABLET | Freq: Four times a day (QID) | ORAL | Status: DC | PRN
  Administered 2020-05-01: 25 mg via ORAL
  Filled 2020-05-01: qty 1

## 2020-05-01 MED ORDER — PROMETHAZINE HCL 25 MG PO TABS: 25.0000 mg | ORAL_TABLET | Freq: Four times a day (QID) | ORAL | 0 refills | Status: DC | PRN

## 2020-05-01 MED ORDER — SODIUM CHLORIDE 0.9 % IV BOLUS: 500.0000 mL | Freq: Once | INTRAVENOUS | Status: DC

## 2020-05-01 MED ORDER — SODIUM CHLORIDE 0.9 % IV SOLN
12.5000 mg | Freq: Four times a day (QID) | INTRAVENOUS | Status: DC | PRN
  Filled 2020-05-01: qty 0.5

## 2020-05-01 MED ORDER — ONDANSETRON HCL 4 MG PO TABS: 4.0000 mg | ORAL_TABLET | Freq: Three times a day (TID) | ORAL | 0 refills | Status: DC | PRN

## 2020-05-01 MED ORDER — OXYCODONE HCL 5 MG PO CAPS: 5.0000 mg | ORAL_CAPSULE | ORAL | 0 refills | Status: DC | PRN

## 2020-05-01 MED ORDER — CYCLOBENZAPRINE HCL 10 MG PO TABS: 5.0000 mg | ORAL_TABLET | Freq: Three times a day (TID) | ORAL | 1 refills | Status: DC | PRN

## 2020-05-01 NOTE — Evaluation (Signed)
Physical Therapy One Time Evaluation Patient Details Name: Brittney Pace MRN: [id number] DOB: May 11, 1955 Today's Date: 05/01/2020   History of Present Illness  Patient is a 65 year old female that had a fall sustaining a severely comminuted and displaced proximal humeral metaphyseal fracture (3/22). Patient s/p Open reduction and internal fixation of comminuted and displaced right proximal humerus fracture on 04/30/20. PMH includes fibromyalgia, DM type 1 and CHF  Clinical Impression  Pt admitted with above diagnosis. Pt fell over a week ago and has been managing at home with assist from spouse while awaiting surgery.  She was able to demonstrate safe transfers and gait.  Pt ambulated better/more stable without a cane.  No skilled therapy indicated from PT standpoint - recommend return home with continued assist from spouse.  OT is managing UE instructions/ADLs, etc.       Follow Up Recommendations No PT follow up;Supervision for mobility/OOB    Equipment Recommendations  None recommended by PT    Recommendations for Other Services       Precautions / Restrictions Precautions Precautions: Fall;Shoulder Type of Shoulder Precautions: PROM 10 ER, 45 ABD,60 FE , PASSIVE ROM FOR ADL's ONLY, NOT for EXERCISES. OK to exercise elbow wrist and hand rom and for edema control. No pendulums, may allow arm to dangle. Pt may shower Shoulder Interventions: Shoulder sling/immobilizer;Off for dressing/bathing/exercises Precaution Booklet Issued: Yes (comment) Precaution Comments: OT managed Required Braces or Orthoses: Sling Restrictions Weight Bearing Restrictions: Yes RUE Weight Bearing: Non weight bearing      Mobility  Bed Mobility Overal bed mobility: Needs Assistance Bed Mobility: Supine to Sit     Supine to sit: Min guard;HOB elevated     General bed mobility comments: OOB at arrival; was min guard with OT    Transfers Overall transfer level: Needs assistance Equipment used:  None Transfers: Sit to/from Stand Sit to Stand: Supervision         General transfer comment: performed x 2  Ambulation/Gait Ambulation/Gait assistance: Supervision Gait Distance (Feet): 30 Feet (30'x2) Assistive device: Straight cane;None Gait Pattern/deviations: Step-through pattern Gait velocity: decreased   General Gait Details: Pt ambulated in room with cane for 30' and without cane for 30';  Demonstrated improved gait pattern and with good stability WITHOUT cane; did not ambulate in hall or perform stairs due to nausea and having to don mask to go in hall would increase nausea.  Pt has support from spouse and has been performing stairs since her fracture with assist.  DId discuss safe stair technique with step to pattern. At this time did not recommend cane but if pt decided to get one in future educated on setting correct height.  Stairs            Wheelchair Mobility    Modified Rankin (Stroke Patients Only)       Balance Overall balance assessment: History of Falls Sitting-balance support: Feet supported;No upper extremity supported Sitting balance-Leahy Scale: Good     Standing balance support: No upper extremity supported Standing balance-Leahy Scale: Good Standing balance comment: able to ambulate without AD                             Pertinent Vitals/Pain Pain Assessment: Faces Faces Pain Scale: Hurts a little bit Pain Location: R shoulder Pain Descriptors / Indicators: Aching;Guarding;Operative site guarding Pain Intervention(s): Limited activity within patient's tolerance;Monitored during session;Premedicated before session;Repositioned    Home Living Family/patient expects to be discharged  to:: Private residence Living Arrangements: Spouse/significant other Available Help at Discharge: Family;Available PRN/intermittently Type of Home: House Home Access: Stairs to enter Entrance Stairs-Rails: Psychiatric nurse of  Steps: 8 Home Layout: One level Home Equipment: Shower seat;Bedside commode      Prior Function Level of Independence: Independent         Comments: Since fall spouse has been assisting as needed - he has taken FMLA and will be with her at d/c     Hand Dominance   Dominant Hand: Right    Extremity/Trunk Assessment   Upper Extremity Assessment Upper Extremity Assessment: Defer to OT evaluation RUE Deficits / Details: note patient able to mobilize hand and wrist functionally RUE: Unable to fully assess due to pain;Unable to fully assess due to immobilization    Lower Extremity Assessment Lower Extremity Assessment: Overall WFL for tasks assessed (reports some soreness knees and great toe from fall but still overall WFL and MMT 5/5)    Cervical / Trunk Assessment Cervical / Trunk Assessment: Normal  Communication   Communication: No difficulties  Cognition Arousal/Alertness: Awake/alert Behavior During Therapy: WFL for tasks assessed/performed Overall Cognitive Status: Within Functional Limits for tasks assessed                                 General Comments: followed all commands      General Comments General comments (skin integrity, edema, etc.): Educated on safety, having supervision at home, assist with stairs.  Pt and family verbalizing understanding and with no further questions as they have been managing at home since pt fractured her arm over a week ago.    Exercises Donning/doffing shirt without moving shoulder: Moderate assistance;Patient able to independently direct caregiver Method for sponge bathing under operated UE: Moderate assistance;Patient able to independently direct caregiver Donning/doffing sling/immobilizer: Maximal assistance;Patient able to independently direct caregiver Correct positioning of sling/immobilizer: Patient able to independently direct caregiver Pendulum exercises (written home exercise program):  (N/A) ROM for  elbow, wrist and digits of operated UE: Patient able to independently direct caregiver Sling wearing schedule (on at all times/off for ADL's): Patient able to independently direct caregiver Proper positioning of operated UE when showering: Patient able to independently direct caregiver Positioning of UE while sleeping: Patient able to independently direct caregiver   Assessment/Plan    PT Assessment Patent does not need any further PT services  PT Problem List         PT Treatment Interventions      PT Goals (Current goals can be found in the Care Plan section)  Acute Rehab PT Goals Patient Stated Goal: less pain PT Goal Formulation: All assessment and education complete, DC therapy    Frequency     Barriers to discharge        Co-evaluation               AM-PAC PT "6 Clicks" Mobility  Outcome Measure Help needed turning from your back to your side while in a flat bed without using bedrails?: A Little Help needed moving from lying on your back to sitting on the side of a flat bed without using bedrails?: A Little Help needed moving to and from a bed to a chair (including a wheelchair)?: A Little Help needed standing up from a chair using your arms (e.g., wheelchair or bedside chair)?: A Little Help needed to walk in hospital room?: A Little Help needed climbing 3-5 steps  with a railing? : A Little 6 Click Score: 18    End of Session   Activity Tolerance: Patient tolerated treatment well (some limitations due to nausea) Patient left: in chair;with family/visitor present Nurse Communication: Mobility status      Time: 1110-1130 PT Time Calculation (min) (ACUTE ONLY): 20 min   Charges:   PT Evaluation $PT Eval Low Complexity: 1 Low          Jonique Kulig, PT Acute Rehab Services Pager [phone] [facility] [place] 05/01/2020, 11:39 AM

## 2020-05-01 NOTE — Progress Notes (Signed)
During last two hours of shift, patient c/o severe pain and stated she had been in pain for 40 minutes and wanted to leave AMA. Reinforced education to patient about letting the RN know when pain starts occurring and not to wait until it gets severe. Reinforced education about oral versus IV pain meds and about being able to tolerate oral pain meds prior to discharge. Explained that IV pain med is for breakthrough pain if still in pain after oral pain med. Spoke with patient's husband at 6:09 AM. Explained to husband about what I discussed with patient regarding pain. Husband agreed to talk to patient about staying until patient is discharged. During bedside shift report, patient resting comfortably after IV Dilaudid given.

## 2020-05-01 NOTE — Evaluation (Addendum)
Occupational Therapy Evaluation Patient Details Name: Brittney Pace MRN: [id number] DOB: 05-Feb-1955 Today's Date: 05/01/2020    History of Present Illness Patient is a 65 year old female that had a fall sustaining a severely comminuted and displaced proximal humeral metaphyseal fracture. Patient s/p Open reduction and internal fixation of comminuted and displaced right proximal humerus fracture. PMH includes fibromyalgia, DM type 1 and CHF   Clinical Impression   s/p ORIF without functional use of right dominant upper extremity secondary to effects of surgery and interscalene block and shoulder precautions. Therapist provided education and instruction to patient in regards to exercises, precautions, positioning, donning upper extremity clothing and bathing while maintaining shoulder precautions, ice and edema management and donning/doffing sling. Patient verbalized understanding and demonstrated as needed. Patient needed assistance to donn shirt, underwear, pants, socks and shoes and provided with instruction on compensatory strategies to perform ADLs. Patient to follow up with MD for further therapy needs.      Follow Up Recommendations  Follow surgeon's recommendation for DC plan and follow-up therapies;Supervision/Assistance - 24 hour    Equipment Recommendations  None recommended by OT       Precautions / Restrictions Precautions Precautions: Fall;Shoulder Type of Shoulder Precautions: PROM 10 ER, 45 ABD,60 FE , PASSIVE ROM FOR ADL's ONLY, NOT for EXERCISES. OK to exercise elbow wrist and hand rom and for edema control. No pendulums, may allow arm to dangle. Pt may shower Shoulder Interventions: Shoulder sling/immobilizer;Off for dressing/bathing/exercises Precaution Booklet Issued: Yes (comment) Required Braces or Orthoses: Sling Restrictions Weight Bearing Restrictions: Yes RUE Weight Bearing: Non weight bearing      Mobility Bed Mobility Overal bed mobility: Needs  Assistance Bed Mobility: Supine to Sit     Supine to sit: Min guard;HOB elevated     General bed mobility comments: able to mobilize LEs and trunk min G for safety    Transfers Overall transfer level: Needs assistance Equipment used: 1 person hand held assist Transfers: Sit to/from Stand Sit to Stand: Min assist         General transfer comment: please see toilet transfer in ADL section    Balance Overall balance assessment: History of Falls;Needs assistance Sitting-balance support: Feet supported Sitting balance-Leahy Scale: Fair     Standing balance support: No upper extremity supported Standing balance-Leahy Scale: Fair Standing balance comment: can static stand at sink without UE support                           ADL either performed or assessed with clinical judgement   ADL Overall ADL's : Needs assistance/impaired Eating/Feeding: Set up;Sitting   Grooming: Wash/dry hands;Supervision/safety;Standing   Upper Body Bathing: Moderate assistance;Sitting   Lower Body Bathing: Sitting/lateral leans;Sit to/from stand;Moderate assistance   Upper Body Dressing : Moderate assistance;Sitting;Cueing for safety;Cueing for sequencing;Cueing for UE precautions Upper Body Dressing Details (indicate cue type and reason): patient having significant pain able to thread L UE, needing assist with R UE and buttoning shirt Lower Body Dressing: Moderate assistance;Sitting/lateral leans;Sit to/from stand Lower Body Dressing Details (indicate cue type and reason): patient needs assist to initiate threading clothing and to pull up over R hip Toilet Transfer: Minimal assistance;Ambulation (BSC over toilet) Toilet Transfer Details (indicate cue type and reason): hand held assistance for stabilization to ambulate to/from bathroom Toileting- Clothing Manipulation and Hygiene: Set up;Sitting/lateral lean       Functional mobility during ADLs: Minimal assistance General ADL  Comments: patient requiring increased assistance with self  care compared to baseline due to post op precautions and significant pain                  Pertinent Vitals/Pain Pain Assessment: Faces Faces Pain Scale: Hurts whole lot Pain Location: R shoulder Pain Descriptors / Indicators: Aching;Grimacing;Guarding;Moaning;Restless;Operative site guarding Pain Intervention(s): Premedicated before session;Limited activity within patient's tolerance;Monitored during session     Hand Dominance Right   Extremity/Trunk Assessment Upper Extremity Assessment Upper Extremity Assessment: RUE deficits/detail RUE Deficits / Details: note patient able to mobilize hand and wrist functionally RUE: Unable to fully assess due to pain;Unable to fully assess due to immobilization   Lower Extremity Assessment Lower Extremity Assessment: Defer to PT evaluation   Cervical / Trunk Assessment Cervical / Trunk Assessment: Normal   Communication Communication Communication: No difficulties   Cognition Arousal/Alertness: Awake/alert Behavior During Therapy: Restless Overall Cognitive Status: Within Functional Limits for tasks assessed                                 General Comments: patient states she has some atrophy "but I'm not total demented" needed some directions repeated however suspect due to medications      Exercises Exercises: Shoulder   Shoulder Instructions Shoulder Instructions Donning/doffing shirt without moving shoulder: Moderate assistance;Patient able to independently direct caregiver Method for sponge bathing under operated UE: Moderate assistance;Patient able to independently direct caregiver Donning/doffing sling/immobilizer: Maximal assistance;Patient able to independently direct caregiver Correct positioning of sling/immobilizer: Patient able to independently direct caregiver Pendulum exercises (written home exercise program):  (N/A) ROM for elbow, wrist and  digits of operated UE: Patient able to independently direct caregiver Sling wearing schedule (on at all times/off for ADL's): Patient able to independently direct caregiver Proper positioning of operated UE when showering: Patient able to independently direct caregiver Positioning of UE while sleeping: Patient able to independently direct caregiver    Home Living Family/patient expects to be discharged to:: Private residence Living Arrangements: Spouse/significant other Available Help at Discharge: Family;Available PRN/intermittently Type of Home: House Home Access: Stairs to enter [employer] of Steps: 5 Entrance Stairs-Rails: Right;Left Home Layout: One level     Bathroom Shower/Tub: Teacher, early years/pre: Standard     Home Equipment: Shower seat;Bedside commode (BSC over toilet)          Prior Functioning/Environment Level of Independence: Independent                 OT Problem List: Pain;Impaired UE functional use;Decreased knowledge of precautions;Decreased safety awareness         OT Goals(Current goals can be found in the care plan section) Acute Rehab OT Goals Patient Stated Goal: less pain OT Goal Formulation: All assessment and education complete, DC therapy   AM-PAC OT "6 Clicks" Daily Activity     Outcome Measure Help from another person eating meals?: A Little Help from another person taking care of personal grooming?: A Little Help from another person toileting, which includes using toliet, bedpan, or urinal?: A Little Help from another person bathing (including washing, rinsing, drying)?: A Lot Help from another person to put on and taking off regular upper body clothing?: A Lot Help from another person to put on and taking off regular lower body clothing?: A Lot 6 Click Score: 15   End of Session Equipment Utilized During Treatment: Other (comment) (sling) Nurse Communication: Mobility status  Activity Tolerance: Patient  limited by pain  Patient left: in chair;with call bell/phone within reach  OT Visit Diagnosis: Pain;History of falling (Z91.81) Pain - Right/Left: Right Pain - part of body: Shoulder                Time: 9826-4158 OT Time Calculation (min): 62 min Charges:  OT General Charges $OT Visit: 1 Visit OT Evaluation $OT Eval Low Complexity: 1 Low OT Treatments $Self Care/Home Management : 38-52 mins  Delbert Phenix OT OT pager: [phone]  Rosemary Holms 05/01/2020, 10:31 AM

## 2020-05-01 NOTE — Discharge Instructions (Signed)
 Kevin M. Supple, M.D., F.A.A.O.S. Orthopaedic Surgery Specializing in Arthroscopic and Reconstructive Surgery of the Shoulder [phone] [address] - [place], [place] [address] - Fax [phone]   POST-OP TOTAL SHOULDER REPLACEMENT INSTRUCTIONS  1. Follow up in the office for your first post-op appointment 10-14 days from the date of your surgery. If you do not already have a scheduled appointment, our office will contact you to schedule.  2. The bandage over your incision is waterproof. You may begin showering with this dressing on. You may leave this dressing on until first follow up appointment within 2 weeks. We prefer you leave this dressing in place until follow up however after 5-7 days if you are having itching or skin irritation and would like to remove it you may do so. Go slow and tug at the borders gently to break the bond the dressing has with the skin. At this point if there is no drainage it is okay to go without a bandage or you may cover it with a light guaze and tape. You can also expect significant bruising around your shoulder that will drift down your arm and into your chest wall. This is very normal and should resolve over several days.   3. Wear your sling/immobilizer at all times except to perform the exercises below or to occasionally let your arm dangle by your side to stretch your elbow. You also need to sleep in your sling immobilizer until instructed otherwise. It is ok to remove your sling if you are sitting in a controlled environment and allow your arm to rest in a position of comfort by your side or on your lap with pillows to give your neck and skin a break from the sling. You may remove it to allow arm to dangle by side to shower. If you are up walking around and when you go to sleep at night you need to wear it.  4. Range of motion to your elbow, wrist, and hand are encouraged 3-5 times daily. Exercise to your hand and fingers helps to reduce  swelling you may experience.   5. Prescriptions for a pain medication and a muscle relaxant are provided for you. It is recommended that if you are experiencing pain that you pain medication alone is not controlling, add the muscle relaxant along with the pain medication which can give additional pain relief. The first 1-2 days is generally the most severe of your pain and then should gradually decrease. As your pain lessens it is recommended that you decrease your use of the pain medications to an "as needed basis'" only and to always comply with the recommended dosages of the pain medications.  6. Pain medications can produce constipation along with their use. If you experience this, the use of an over the counter stool softener or laxative daily is recommended.   7. For additional questions or concerns, please do not hesitate to call the office. If after hours there is an answering service to forward your concerns to the physician on call.  8.Pain control following an exparel block  To help control your post-operative pain you received a nerve block  performed with Exparel which is a long acting anesthetic (numbing agent) which can provide pain relief and sensations of numbness (and relief of pain) in the operative shoulder and arm for up to 3 days. Sometimes it provides mixed relief, meaning you may still have numbness in certain areas of the arm but can still be able to   move  parts of that arm, hand, and fingers. We recommend that your prescribed pain medications  be used as needed. We do not feel it is necessary to "pre medicate" and "stay ahead" of pain.  Taking narcotic pain medications when you are not having any pain can lead to unnecessary and potentially dangerous side effects.    9. Use the ice machine as much as possible in the first 5-7 days from surgery, then you can wean its use to as needed. The ice typically needs to be replaced every 6 hours, instead of ice you can actually freeze  water bottles to put in the cooler and then fill water around them to avoid having to purchase ice. You can have spare water bottles freezing to allow you to rotate them once they have melted. Try to have a thin shirt or light cloth or towel under the ice wrap to protect your skin.   FOR ADDITIONAL INFO ON ICE MACHINE AND INSTRUCTIONS GO TO THE WEBSITE AT  [email or web address]  10.  We recommend that you avoid any dental work or cleaning in the first 3 months following your joint replacement. This is to help minimize the possibility of infection from the bacteria in your mouth that enters your bloodstream during dental work. We also recommend that you take an antibiotic prior to your dental work for the first year after your shoulder replacement to further help reduce that risk. Please simply contact our office for antibiotics to be sent to your pharmacy prior to dental work.  11. Dental Antibiotics:  In most cases prophylactic antibiotics for Dental procdeures after total joint surgery are not necessary.  Exceptions are as follows:  1. History of prior total joint infection  2. Severely immunocompromised (Organ Transplant, cancer chemotherapy, Rheumatoid biologic meds such as Weatherby)  3. Poorly controlled diabetes (A1C &gt; 8.0, blood glucose over 200)  If you have one of these conditions, contact your surgeon for an antibiotic prescription, prior to your dental procedure.   POST-OP EXERCISES  OK to allow arm to dangle by side and mover elbow wrist and hand, avoid bearing weight through the arm

## 2020-05-01 NOTE — Progress Notes (Signed)
When patient arrived from PACU at the 8 PM hour, was told in report that patient's oxygen saturation level was WNL on room air. After bedside report was given from the PACU RN, via patient's continuous pulse ox monitor, oxygen saturation levels were running from the 90's and quickly down to the 70's and 80's. Put patient on 2 L 02 nasal canula and the oxygen saturation levels were still falling to the 70's/80's. The oxygen saturation levels were still doing the same thing when patient was on 3 L of 02. Called rapid response at 8:38 PM. Informed Mortimer Fries, Charge RN at 8:42 PM that I called rapid. Was told that Kandra Nicolas, RN (House coverage) was on the way to assess patient. Patient told us that when in pain, she tends to hold her breath. Encouraged deep breathing and teaching on incentive spirometer use done. Patient is alert and oriented and not in any distress. When attempting to warm hands and change finger probes as well as use vital sign machine to monitor 02 sats, patient's 02 sats went to normal limits. Will give patient throat spray (pt c/o pain in throat) and continue to monitor. Will notify Margarita Grizzle, RN (house coverage) if any further concerns.

## 2020-05-01 NOTE — Plan of Care (Signed)
  Problem: Education: Goal: Knowledge of General Education information will improve Description Including pain rating scale, medication(s)/side effects and non-pharmacologic comfort measures Outcome: Progressing   Problem: Health Behavior/Discharge Planning: Goal: Ability to manage health-related needs will improve Outcome: Progressing   

## 2020-05-01 NOTE — Discharge Summary (Signed)
PATIENT ID:      Brittney Pace  MRN:     [id number] DOB/AGE:    66/28/1957 / 66 y.o.     DISCHARGE SUMMARY  ADMISSION DATE:    04/30/2020 DISCHARGE DATE:    ADMISSION DIAGNOSIS: Right proximal humerus fracture Past Medical History:  Diagnosis Date  . Anemia   . Arthritis   . 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 CHF (congestive heart failure) ([place])   . Chronic kidney disease    stage 3  . Coronary artery disease   . Depression   . Diabetes mellitus without complication (HCC)    Type 1  . Diabetic retinopathy ([place])   . 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 Family history of adverse reaction to anesthesia    mother: " while she was under she stopped breathing."  . Fibromyalgia   . Gastroparesis   . GERD (gastroesophageal reflux disease)   . Headache    migraines  . Hypothyroidism   . IBS (irritable bowel syndrome)   . Idiopathic edema   . Iron deficiency anemia 09/04/2019  . Lyme disease   . Mitral valve prolapse   . Myocardial infarction ([place])    1 major in 1999 and 2 minor " small vessel disease."  . Osteoporosis   . Peripheral neuropathy   . Peripheral vascular disease ([place])   . 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  HOSPITAL COURSE:  Brittney Pace is a 65 y.o. admitted on 04/30/2020 with a diagnosis of Right proximal humerus fracture.  They were brought to the operating room on 04/30/2020 and underwent Procedure(s): OPEN REDUCTION INTERNAL FIXATION (ORIF) PROXIMAL HUMERUS FRACTURE.    They were given perioperative antibiotics:  Anti-infectives (From admission, onward)   Start     Dose/Rate  Route Frequency Ordered Stop   05/01/20 0600  ceFAZolin (ANCEF) IVPB 2g/100 mL premix        2 g 200 mL/hr over 30 Minutes Intravenous On call to O.R. 04/30/20 1425 04/30/20 1700    .  Patient underwent the above named procedure and tolerated it well. The following day they were hemodynamically stable and pain was controlled on oral analgesics. They were neurovascularly intact to the operative extremity. OT was ordered and worked with patient per protocol. They were medically and orthopaedically stable for discharge on . Events of last night noted with Rapid response input noted. Patient reports poor pain control last night, improving some this am. O2 sats normal this am , Stable for DC to home    DIAGNOSTIC STUDIES:  RECENT RADIOGRAPHIC STUDIES :  DG Shoulder Right  Result Date: 04/30/2020 CLINICAL DATA:  Elective surgery. EXAM: RIGHT SHOULDER - 2+ VIEW; DG C-ARM 1-60 MIN-NO REPORT COMPARISON:  None. FINDINGS: Five fluoroscopic spot views of the right shoulder obtained in the operating room in frontal, lateral and oblique projections. Interval lateral plate and multi screw fixation of comminuted proximal humerus fracture. Total fluoroscopy time 24 seconds. IMPRESSION: Intraoperative fluoroscopy during proximal humerus fracture ORIF. Electronically Signed   By: Keith Rake M.D.   On: 04/30/2020 19:56  DG C-Arm 1-60 Min-No Report  Result Date: 04/30/2020 Fluoroscopy was utilized by the requesting physician.  No radiographic interpretation.    RECENT VITAL SIGNS:   Patient Vitals for the past 24 hrs:  BP Temp Temp src Pulse Resp SpO2 Height  05/01/20 0535 (!) 145/63 (!) 97.2 F (36.2 C) -- 67 16 99 % --  05/01/20 0201 (!) 121/49 98.6 F (37 C) -- 67 16 99 % --  04/30/20 2239 (!) 136/58 97.9 F (36.6 C) Oral 67 16 100 % --  04/30/20 2011 (!) 162/70 98.2 F (36.8 C) Oral 93 20 (!) 89 % --  04/30/20 1930 (!) 154/87 97.6 F (36.4 C) -- 80 18 95 % --  04/30/20 1915 (!) 163/70 -- -- 69  19 99 % --  04/30/20 1900 (!) 144/94 -- -- (!) 58 15 99 % --  04/30/20 1845 (!) 165/80 98.1 F (36.7 C) -- 69 17 100 % --  04/30/20 1220 (!) 176/75 97.6 F (36.4 C) Oral 71 19 99 % 4\' 11"  (1.499 m)  .  RECENT EKG RESULTS:    Orders placed or performed during the hospital encounter of 04/30/20  . EKG 12 lead per protocol  . EKG 12 lead per protocol    DISCHARGE INSTRUCTIONS:    DISCHARGE MEDICATIONS:   Allergies as of 05/01/2020      Reactions   Morphine Anaphylaxis, Shortness Of Breath   Morphine And Related Shortness Of Breath   Penicillins Hives   Tolerated ANCEF on 04/30/20 Has patient had a PCN reaction causing immediate rash, facial/tongue/throat swelling, SOB or lightheadedness with hypotension: Yes Has patient had a PCN reaction causing severe rash involving mucus membranes or skin necrosis: No Has patient had a PCN reaction that required hospitalization No Has patient had a PCN reaction occurring within the last 10 years: No If all of the above answers are "NO", then may proceed with Cephalosporin use.   Tramadol Anaphylaxis   Tramadol-acetaminophen Other (See Comments)   Small vessel heart attack Other reaction(s): Cardiovascular Arrest (ALLERGY/intolerance), Other (See Comments)   Acetaminophen Other (See Comments)   Alters insulin pump readings   Amitriptyline Other (See Comments)   Severe headache/ out of body feeling   Codeine Other (See Comments)   Severe headaches/ out of body feeling   Ibuprofen Other (See Comments)   Messes up CGM reading on glucose monitor   Losartan Cough   Propoxyphene Other (See Comments)   Severe headaches / out of body feeling   Shellfish Allergy Diarrhea, Nausea And Vomiting   Statins Other (See Comments)   Muscle pains Other reaction(s): Other   Sulfamethoxazole Other (See Comments)   Mouth ulcers   Trichophyton Itching, Other (See Comments)   (fungus) sinusitis   Krill Oil Diarrhea, Itching, Nausea And Vomiting       Medication List    TAKE these medications   AMBULATORY NON FORMULARY MEDICATION Incontinence pads per patient preference, #unlimited, refill x99 Fax to [phone]   Armodafinil 250 MG tablet Take 1 tablet (250 mg total) by mouth daily. OR can take 0.5 tablet (125 mg) po bid What changed: additional instructions   aspirin EC 325 MG tablet Take 1 tablet (325 mg total) by mouth daily. What changed: when to take this   carvedilol 12.5 MG tablet Commonly known as: COREG Take 1 tablet (12.5 mg total) by mouth 2 (two) times daily with a meal.   Creon 3000-9500 units Cpep Generic drug: Pancrelipase (Lip-Prot-Amyl) Take 2 capsules by mouth  2 (two) times daily with a meal. Amylase 15000 units   cyclobenzaprine 10 MG tablet Commonly known as: FLEXERIL Take 0.5-1 tablets (5-10 mg total) by mouth 3 (three) times daily as needed for muscle spasms. Caution: can cause drowsiness What changed: how much to take   Dexcom G6 Sensor Misc USE TO CONTINUOUSLY MONITOR BLOOD SUGARS. CHANGE EVERY 10 DAYS   diclofenac Sodium 1 % Gel Commonly known as: VOLTAREN Apply 2 g topically daily as needed (Pain).   diphenhydrAMINE 25 MG tablet Commonly known as: BENADRYL Take 25 mg by mouth at bedtime.   diphenoxylate-atropine 2.5-0.025 MG tablet Commonly known as: LOMOTIL TAKE 1 TABLET BY MOUTH 4 TIMES DAILY AS NEEDED FOR  DIARRHEA  OR  LOOSE  STOOLS What changed: See the new instructions.   Emgality 120 MG/ML Soaj Generic drug: Galcanezumab-gnlm Inject 120 mg into the skin every 30 (thirty) days. Start taking on: May 14, 2020   esomeprazole 20 MG capsule Commonly known as: NEXIUM Take 20 mg by mouth every morning.   estradiol 0.1 MG/GM vaginal cream Commonly known as: ESTRACE VAGINAL Place 1 Applicatorful vaginally at bedtime for 7 days, THEN 1 Applicatorful 3 (three) times a week. Start taking on: February 13, 2020   fluocinonide 0.05 % external solution Commonly known as: LIDEX Apply  1 application topically See admin instructions. Once every three days   gabapentin 100 MG capsule Commonly known as: NEURONTIN Take 1-2 capsules (100-200 mg total) by mouth 3 (three) times daily as needed. What changed:   when to take this  additional instructions   insulin pump Soln Inject into the skin continuous. Novolog insulin   ketoconazole 2 % shampoo Commonly known as: NIZORAL APPLY 1 APPLICATION TOPICALLY 2 (TWO) TIMES A WEEK.   lidocaine-prilocaine cream Commonly known as: EMLA Apply 1 application topically every 2 (two) hours as needed. Please run with good Rx discount coupon   Melatonin 5 MG Chew Chew 5 mg by mouth daily as needed (Sleep).   nitroGLYCERIN 0.4 MG/SPRAY spray Commonly known as: NITROLINGUAL Place 1 spray under the tongue every 5 (five) minutes x 3 doses as needed for chest pain.   ondansetron 4 MG tablet Commonly known as: Zofran Take 1 tablet (4 mg total) by mouth every 8 (eight) hours as needed for nausea or vomiting.   OVER THE COUNTER MEDICATION as directed. Magnesium L-Treonate   oxycodone 5 MG capsule Commonly known as: OXY-IR Take 1 capsule (5 mg total) by mouth every 4 (four) hours as needed.   spironolactone 25 MG tablet Commonly known as: ALDACTONE Take 50 mg by mouth daily.   thyroid 60 MG tablet Commonly known as: Armour Thyroid Take 1 tablet (60 mg total) by mouth daily before breakfast.   torsemide 20 MG tablet Commonly known as: [name] TAKE 1 TABLET ONCE DAILY INTHE MORNING. MAY TAKE AN   EXTRA 1/2 TABLET AS        NEEDED What changed: See the new instructions.   valACYclovir 500 MG tablet Commonly known as: VALTREX Take 500 mg by mouth daily as needed (Flair up).       FOLLOW UP VISIT:    Follow-up Information    Justice Britain, MD Follow up.   Specialty: Orthopedic Surgery Why: We will call you with your follow up appt Contact information: [place]  [id number] [phone]               DISCHARGE TO: Home   DISCHARGE CONDITION:  Stable  Olivia Mackie Zarina Pe for Dr. Justice Britain 05/01/2020, 8:42 AM

## 2020-05-01 NOTE — Anesthesia Postprocedure Evaluation (Signed)
Anesthesia Post Note  Patient: Ermal Brzozowski  Procedure(s) Performed: OPEN REDUCTION INTERNAL FIXATION (ORIF) PROXIMAL HUMERUS FRACTURE (Right )     Patient location during evaluation: PACU Anesthesia Type: General and Regional Level of consciousness: awake and alert Pain management: pain level controlled Vital Signs Assessment: post-procedure vital signs reviewed and stable Respiratory status: spontaneous breathing, nonlabored ventilation, respiratory function stable and patient connected to nasal cannula oxygen Cardiovascular status: blood pressure returned to baseline and stable Postop Assessment: no apparent nausea or vomiting Anesthetic complications: no   No complications documented.  Last Vitals:  Vitals:   04/30/20 2011 04/30/20 2239  BP: (!) 162/70 (!) 136/58  Pulse: 93 67  Resp: 20 16  Temp: 36.8 C 36.6 C  SpO2: (!) 89% 100%    Last Pain:  Vitals:   04/30/20 2239  TempSrc: Oral  PainSc:                  Lac[place] S

## 2020-05-04 ENCOUNTER — Other Ambulatory Visit: Payer: Self-pay | Admitting: Osteopathic Medicine

## 2020-05-04 ENCOUNTER — Encounter (HOSPITAL_COMMUNITY): Payer: Self-pay | Admitting: Orthopedic Surgery

## 2020-05-05 ENCOUNTER — Other Ambulatory Visit: Payer: No Typology Code available for payment source

## 2020-05-05 ENCOUNTER — Ambulatory Visit: Payer: No Typology Code available for payment source

## 2020-05-05 NOTE — Telephone Encounter (Signed)
Patient is past due for an appointment.

## 2020-05-07 ENCOUNTER — Other Ambulatory Visit: Payer: Self-pay

## 2020-05-07 MED ORDER — ARMODAFINIL 250 MG PO TABS: 250.0000 mg | ORAL_TABLET | Freq: Every day | ORAL | 1 refills | Status: DC

## 2020-05-07 MED ORDER — ARMODAFINIL 250 MG PO TABS: 250.0000 mg | ORAL_TABLET | Freq: Every day | ORAL | 0 refills | Status: DC

## 2020-05-07 NOTE — Progress Notes (Signed)
Requesting refill too soon, picked up #90 on 03/27/2020 based on PDMP should have enough for 90 days from then

## 2020-05-07 NOTE — Progress Notes (Signed)
Pt requested med refill for armodafinil. Pt is requesting for rx to be sent to [facility] on [address]. Rx pended.

## 2020-05-13 ENCOUNTER — Inpatient Hospital Stay: Payer: No Typology Code available for payment source

## 2020-05-13 ENCOUNTER — Ambulatory Visit: Payer: No Typology Code available for payment source | Admitting: Medical

## 2020-05-13 ENCOUNTER — Telehealth: Payer: Self-pay | Admitting: *Deleted

## 2020-05-13 NOTE — Telephone Encounter (Signed)
Patient's husband called to cancel appointment due to patient being sick.- will call back to reschedule

## 2020-05-19 ENCOUNTER — Other Ambulatory Visit: Payer: Self-pay | Admitting: Cardiology

## 2020-05-19 DIAGNOSIS — I2510 Atherosclerotic heart disease of native coronary artery without angina pectoris: Secondary | ICD-10-CM

## 2020-05-20 ENCOUNTER — Other Ambulatory Visit: Payer: Self-pay

## 2020-05-21 ENCOUNTER — Ambulatory Visit (INDEPENDENT_AMBULATORY_CARE_PROVIDER_SITE_OTHER): Payer: No Typology Code available for payment source | Admitting: Medical

## 2020-05-21 ENCOUNTER — Other Ambulatory Visit: Payer: Self-pay

## 2020-05-21 VITALS — BP 150/80 | HR 62 | Resp 18 | Ht 59.0 in | Wt 103.0 lb

## 2020-05-21 DIAGNOSIS — R519 Headache, unspecified: Secondary | ICD-10-CM

## 2020-05-21 DIAGNOSIS — E1059 Type 1 diabetes mellitus with other circulatory complications: Secondary | ICD-10-CM

## 2020-05-21 DIAGNOSIS — I10 Essential (primary) hypertension: Secondary | ICD-10-CM

## 2020-05-21 DIAGNOSIS — K3184 Gastroparesis: Secondary | ICD-10-CM

## 2020-05-21 DIAGNOSIS — G8929 Other chronic pain: Secondary | ICD-10-CM

## 2020-05-21 DIAGNOSIS — K219 Gastro-esophageal reflux disease without esophagitis: Secondary | ICD-10-CM

## 2020-05-21 DIAGNOSIS — E1022 Type 1 diabetes mellitus with diabetic chronic kidney disease: Secondary | ICD-10-CM

## 2020-05-21 DIAGNOSIS — I2510 Atherosclerotic heart disease of native coronary artery without angina pectoris: Secondary | ICD-10-CM

## 2020-05-21 DIAGNOSIS — N183 Chronic kidney disease, stage 3 unspecified: Secondary | ICD-10-CM

## 2020-05-21 NOTE — Patient Instructions (Addendum)
Hx of gerd and gastroparesis. Continue omeprazole and placed referral to GI for possible endoscopy and GI motility studies if indicated.  For type 1 diabetes on insulin pump continue to follow up with endocrinologist.  For anemia continue to see hematologist.  For history of migraines continue with neurologist and start emgality.   For ckd keep adeuately hydrate and folllow up with endocrinlogist as regularly scheduled.  HTN not ideally controlled. Continue coreg and spirinolactone. Check bp 3 times 5 minute apart over next 2 days. Update me by my chart on readings. If bp elevated with those readings. Then consider low dose amlodiine 2.5 mg daily.   Follow up in one month or as needed

## 2020-05-21 NOTE — Progress Notes (Signed)
Subjective:    Patient ID: Brittney Pace, female    DOB: 12-19-1955, 65 y.o.   MRN: [id number]  HPI  Pt in for first time   Pt has hx of gerd. Her symptoms are controlled with omeprazole. Pt states has GI md at [facility]. She wants to be referred to  [place]. Pt states she has gastroparesis.   Pt has hx of diarrhea. Now on creon and diarrhea much better.   Hx of ckf and anemia. Anemia treated by [name].   Pt sees nephrologist at [facility]. Pt states last visit prescribed spirinolactone.   Hx of type I diabetes since youth. Pt uses insulin. Pt sees endocrinologist at [place] appears to be happy with him. Pt might have insurance change. If so she wants to try [facility].   Last colonoscopy done in 2017.   Hx of low thyroid. Pt is on armour thyroid.   On review controlled med profile she is on oxyccodone. Pt was given this after rt upper extremity fracture. Pt had surgery for humerus fracture. She has not started therapy yet. Oxycodone written by orthopedist.  Also on review pt has rx of armodafinil. Pt has seen neurologist in the past. On extensive review of care everywhere I could not find diagnosis for which nuvigil was prescribed. Pt states started by neurologist and continue by her former pcp Dr. Sheppard Coil. Pt has no narcolepsy. Brain atrophy dx in chart. Pt has hx of chronic ha. Pt saw Dr. Jaynee Eagles. She prescribed emgality. Pt has not started.    Pt bp is elevated. Pt is on coreg 12. 5 mg bid. Also on spirinolactone. On (2) 25 mg daily. Pt states nephrologist did not think amlodipine was good idea due to potential of pedal edema.    Review of Systems  Constitutional: Negative for chills, fatigue and fever.  Respiratory: Negative for cough, chest tightness, shortness of breath and wheezing.   Cardiovascular: Negative for chest pain and palpitations.  Gastrointestinal: Positive for diarrhea. Negative for abdominal pain, blood in stool, nausea and vomiting.        Jerrye Bushy.  Musculoskeletal: Negative for joint swelling and neck pain.  Skin: Negative for rash.  Neurological: Negative for dizziness, syncope, speech difficulty, light-headedness, numbness and headaches.  Hematological: Negative for adenopathy. Does not bruise/bleed easily.  Psychiatric/Behavioral: Negative for behavioral problems, confusion and sleep disturbance. The patient is not nervous/anxious and is not hyperactive.      Past Medical History:  Diagnosis Date  . Anemia   . Arthritis   . 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 CHF (congestive heart failure) ([place])   . Chronic kidney disease    stage 3  . Coronary artery disease   . Depression   . Diabetes mellitus without complication (HCC)    Type 1  . Diabetic retinopathy ([place])   . 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 Family history of adverse reaction to anesthesia    mother: " while she was under she stopped breathing."  . Fibromyalgia   . Gastroparesis   . GERD (gastroesophageal reflux disease)   . Headache    migraines  . Hypothyroidism   . IBS (irritable bowel syndrome)   . Idiopathic edema   . Iron deficiency anemia 09/04/2019  . Lyme disease   . Mitral valve prolapse   . Myocardial infarction ([place])    1 major in 1999 and 2 minor " small vessel disease."  . Osteoporosis   . Peripheral  neuropathy   . Peripheral vascular disease ([place])   . 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 Marital status: Married    Spouse name: Not on file  . Number of children: 0  . Years of education: college  . Highest education level: Not on file  Occupational History  . Occupation: Retired  Tobacco Use  . Smoking status: Former Smoker    Types: Cigarettes  . Smokeless tobacco: Never Used  . Tobacco comment:  " Quit smoking cigarettes in 20's "  Vaping  Use  . Vaping Use: Never used  Substance and Sexual Activity  . Alcohol use: Yes    Comment: occasional beer or wine  . Drug use: No  . Sexual activity: Not on file  Other Topics Concern  . Not on file  Social History Narrative   Drinks 1-2  cups caffeine drinks a day    Right handed   Lives at home with husband and dog   Social Determinants of Health   Financial Resource Strain: Not on file  Food Insecurity: Not on file  Transportation Needs: Not on file  Physical Activity: Not on file  Stress: Not on file  Social Connections: Not on file  Intimate Partner Violence: Not on file    Past Surgical History:  Procedure Laterality Date  . ABDOMINAL HYSTERECTOMY    . APPENDECTOMY    . BREAST SURGERY     B/L biopsy and lumpectomy   . CARDIAC CATHETERIZATION    . CARPAL TUNNEL RELEASE    . CATARACT EXTRACTION W/ INTRAOCULAR LENS IMPLANT     right eye  . COLONOSCOPY W/ BIOPSIES AND POLYPECTOMY    . CORONARY ARTERY BYPASS GRAFT    . coronary artery stents     at [facility] and [place]  . ECTOPIC PREGNANCY SURGERY    . NASAL SEPTUM SURGERY    . OPEN REDUCTION INTERNAL FIXATION (ORIF) DISTAL RADIAL FRACTURE Right 05/06/2015   Procedure: OPEN REDUCTION INTERNAL FIXATION (ORIF) RIGHT DISTAL RADIAL FRACTURE AND REPAIRS AS NEEDED;  Surgeon: Iran Planas, MD;  Location: [facility];  Service: Orthopedics;  Laterality: Right;  . ORIF HUMERUS FRACTURE Right 04/30/2020   Procedure: OPEN REDUCTION INTERNAL FIXATION (ORIF) PROXIMAL HUMERUS FRACTURE;  Surgeon: Justice Britain, MD;  Location: [facility];  Service: Orthopedics;  Laterality: Right;  131min  . ORIF WRIST FRACTURE Left 11/05/2014  . ORIF WRIST FRACTURE Left 11/05/2014   Procedure: OPEN REDUCTION INTERNAL FIXATION (ORIF) LEFT WRIST FRACTURE AND REPAIR AS INDICATED;  Surgeon: Iran Planas, MD;  Location: [facility];  Service: Orthopedics;  Laterality: Left;  . TRIGGER FINGER RELEASE      Family History  Problem Relation Age of Onset  . Breast cancer Mother    . Migraines Mother   . Heart disease Father   . Hypertension Father   . Breast cancer Sister     Allergies  Allergen Reactions  . Morphine Anaphylaxis and Shortness Of Breath  . Morphine And Related Shortness Of Breath  . Penicillins Hives    Tolerated ANCEF on 04/30/20 Has patient had a PCN reaction causing immediate rash, facial/tongue/throat swelling, SOB or lightheadedness with hypotension: Yes Has patient had a PCN reaction causing severe rash involving mucus membranes or skin necrosis: No Has patient had a PCN reaction  that required hospitalization No Has patient had a PCN reaction occurring within the last 10 years: No If all of the above answers are "NO", then may proceed with Cephalosporin use.  . Tramadol Anaphylaxis  . Tramadol-Acetaminophen Other (See Comments)    Small vessel heart attack Other reaction(s): Cardiovascular Arrest (ALLERGY/intolerance), Other (See Comments)   . Acetaminophen Other (See Comments)    Alters insulin pump readings   . Amitriptyline Other (See Comments)    Severe headache/ out of body feeling  . Codeine Other (See Comments)    Severe headaches/ out of body feeling   . Ibuprofen Other (See Comments)    Messes up CGM reading on glucose monitor    . Losartan Cough  . Propoxyphene Other (See Comments)    Severe headaches / out of body feeling   . Shellfish Allergy Diarrhea and Nausea And Vomiting  . Statins Other (See Comments)    Muscle pains Other reaction(s): Other  . Sulfamethoxazole Other (See Comments)    Mouth ulcers   . Trichophyton Itching and Other (See Comments)    (fungus) sinusitis   . Krill Oil Diarrhea, Itching and Nausea And Vomiting    Current Outpatient Medications on File Prior to Visit  Medication Sig Dispense Refill  . AMBULATORY NON FORMULARY MEDICATION Incontinence pads per patient preference, #unlimited, refill x99 Fax to [phone] 1 Units prn  . Armodafinil 250 MG tablet Take 1 tablet (250 mg  total) by mouth daily. OR can take 0.5 tablet (125 mg) po bid 90 tablet 0  . aspirin EC 325 MG tablet Take 1 tablet (325 mg total) by mouth daily. (Patient taking differently: Take 325 mg by mouth at bedtime.) 30 tablet 0  . carvedilol (COREG) 12.5 MG tablet TAKE 1 TABLET BY MOUTH TWICE DAILY WITH MEALS 60 tablet 0  . Continuous Blood Gluc Sensor (DEXCOM G6 SENSOR) MISC USE TO CONTINUOUSLY MONITOR BLOOD SUGARS. CHANGE EVERY 10 DAYS    . cyclobenzaprine (FLEXERIL) 10 MG tablet Take 0.5-1 tablets (5-10 mg total) by mouth 3 (three) times daily as needed for muscle spasms. Caution: can cause drowsiness 40 tablet 1  . diclofenac Sodium (VOLTAREN) 1 % GEL Apply 2 g topically daily as needed (Pain).    Marland Kitchen diphenhydrAMINE (BENADRYL) 25 MG tablet Take 25 mg by mouth at bedtime.    . diphenoxylate-atropine (LOMOTIL) 2.5-0.025 MG tablet TAKE 1 TABLET BY MOUTH 4 TIMES DAILY AS NEEDED FOR  DIARRHEA  OR  LOOSE  STOOLS (Patient taking differently: Take 1 tablet by mouth daily as needed for diarrhea or loose stools.) 90 tablet 0  . esomeprazole (NEXIUM) 20 MG capsule Take 20 mg by mouth every morning.     . fluocinonide (LIDEX) 0.05 % external solution Apply 1 application topically See admin instructions. Once every three days    . gabapentin (NEURONTIN) 100 MG capsule Take 1-2 capsules (100-200 mg total) by mouth 3 (three) times daily as needed. (Patient taking differently: Take 100-200 mg by mouth 2 (two) times daily. Pain and headache) 180 capsule 5  . Galcanezumab-gnlm (EMGALITY) 120 MG/ML SOAJ Inject 120 mg into the skin every 30 (thirty) days. 1 mL 5  . Insulin Human (INSULIN PUMP) SOLN Inject into the skin continuous. Novolog insulin    . ketoconazole (NIZORAL) 2 % shampoo APPLY 1 APPLICATION TOPICALLY 2 (TWO) TIMES A WEEK. 240 mL 1  . lidocaine-prilocaine (EMLA) cream Apply 1 application topically every 2 (two) hours as needed. Please run with good Rx discount coupon 30 g  0  . Melatonin 5 MG CHEW Chew 5 mg by  mouth daily as needed (Sleep).    . nitroGLYCERIN (NITROLINGUAL) 0.4 MG/SPRAY spray Place 1 spray under the tongue every 5 (five) minutes x 3 doses as needed for chest pain. 12 g 1  . ondansetron (ZOFRAN) 4 MG tablet Take 1 tablet (4 mg total) by mouth every 8 (eight) hours as needed for nausea or vomiting. 10 tablet 0  . OVER THE COUNTER MEDICATION as directed. Magnesium L-Treonate    . oxycodone (OXY-IR) 5 MG capsule Take 1 capsule (5 mg total) by mouth every 4 (four) hours as needed. 30 capsule 0  . Pancrelipase, Lip-Prot-Amyl, (CREON) 3000-9500 units CPEP Take 2 capsules by mouth 2 (two) times daily with a meal. Amylase 15000 units    . promethazine (PHENERGAN) 25 MG tablet Take 1 tablet (25 mg total) by mouth every 6 (six) hours as needed for nausea or vomiting. 10 tablet 0  . spironolactone (ALDACTONE) 25 MG tablet Take 50 mg by mouth daily.    Marland Kitchen thyroid (ARMOUR THYROID) 60 MG tablet Take 1 tablet (60 mg total) by mouth daily before breakfast. 90 tablet 3  . torsemide ([name]) 20 MG tablet TAKE 1 TABLET ONCE DAILY INTHE MORNING. MAY TAKE AN   EXTRA 1/2 TABLET AS        NEEDED (Patient taking differently: Take 20 mg by mouth daily as needed (Water retention).) 135 tablet 0  . valACYclovir (VALTREX) 500 MG tablet Take 500 mg by mouth daily as needed (Flair up).     No current facility-administered medications on file prior to visit.    BP (!) 150/80   Pulse 62   Resp 18   Ht 4\' 11"  (1.499 m)   Wt 103 lb (46.7 kg)   SpO2 90%   BMI 20.80 kg/m       Objective:   Physical Exam  General Mental Status- Alert. General Appearance- Not in acute distress.   Skin General: Color- Normal Color. Moisture- Normal Moisture.  Neck Carotid Arteries- Normal color. Moisture- Normal Moisture. No carotid bruits. No JVD.  Chest and Lung Exam Auscultation: Breath Sounds:-Normal.  Cardiovascular Auscultation:Rythm- Regular. Murmurs & Other Heart Sounds:Auscultation of the heart reveals- No  Murmurs.  Abdomen Inspection:-Inspeection Normal. Palpation/Percussion:Note:No mass. Palpation and Percussion of the abdomen reveal- Non Tender, Non Distended + BS, no rebound or guarding.    Neurologic Cranial Nerve exam:- CN III-XII intact(No nystagmus), symmetric smile. Strength:- 5/5 equal and symmetric strength both upper and lower extremities.      Assessment & Plan:  Hx of gerd and gastroparesis. Continue omeprazole and placed referral to GI for possible endoscopy and GI motility studies if indicated.  For type 1 diabetes on insulin pump continue to follow up with endocrinologist.  For anemia continue to see hematologist.  For history of migraines continue with neurologist and start emgality.   For ckd keep adeuately hydrate and folllow up with endocrinlogist as regularly scheduled.  HTN not ideally controlled. Continue coreg and spirinolactone. Check bp 3 times 5 minute apart over next 2 days. Update me by my chart on readings. If bp elevated with those readings. Then consider low dose amlodiine 2.5 mg daily.   Follow up in one month or as needed  Mackie Pai, PA-C   Time spent with patient today was 50  minutes which consisted of extensive  chart review in [facility] record & care everywhere, discussing diagnoses, answering question, discussing bp treatment options and documentation. Long  discussion on nuvigil rx that she uses and discussion on indication. If will be able to prescribe in future need ot understand indication.

## 2020-05-22 ENCOUNTER — Telehealth: Payer: Self-pay | Admitting: Gastroenterology

## 2020-05-22 NOTE — Telephone Encounter (Signed)
Hey Dr Lyndel Safe, this pt is being referred to [place] from [facility] for Gastric reflux but it looks like the pt has seen [facility] Dr Nyoka Cowden on 09/2019, notes are in epic for review, please advise on scheduling

## 2020-05-26 ENCOUNTER — Ambulatory Visit: Payer: No Typology Code available for payment source

## 2020-05-26 ENCOUNTER — Other Ambulatory Visit: Payer: No Typology Code available for payment source

## 2020-05-26 ENCOUNTER — Telehealth: Payer: Self-pay | Admitting: Medical

## 2020-05-26 NOTE — Telephone Encounter (Signed)
Medication hasnt been filled since 2019 , is it ok to fill ?

## 2020-05-26 NOTE — Telephone Encounter (Signed)
Medication: torsemide ([name]) 20 MG tablet    Has the patient contacted their pharmacy? No. (If no, request that the patient contact the pharmacy for the refill.) (If yes, when and what did the pharmacy advise?)  Preferred Pharmacy (with phone number or street name):  [place], [place] Phone:  [phone]  Fax:  [phone]       Agent: Please be advised that RX refills may take up to 3 business days. We ask that you follow-up with your pharmacy.

## 2020-05-26 NOTE — Telephone Encounter (Signed)
Pt has history of chronic kidney disease. She is on spirinolactone/diuretic. Torsemide is also a diuretic. Diuretic can effect kidney function. She should get med from nephrologist if they think appropiate.   Recommend she ask her nephrologist. With her kidney function would not recommend too many diuretics.

## 2020-05-27 ENCOUNTER — Other Ambulatory Visit: Payer: Self-pay

## 2020-05-27 ENCOUNTER — Inpatient Hospital Stay: Payer: No Typology Code available for payment source

## 2020-05-27 ENCOUNTER — Inpatient Hospital Stay: Payer: No Typology Code available for payment source | Attending: Hematology & Oncology

## 2020-05-27 VITALS — BP 137/44 | HR 61 | Temp 98.0°F | Resp 18

## 2020-05-27 DIAGNOSIS — E1022 Type 1 diabetes mellitus with diabetic chronic kidney disease: Secondary | ICD-10-CM

## 2020-05-27 DIAGNOSIS — N183 Chronic kidney disease, stage 3 unspecified: Secondary | ICD-10-CM

## 2020-05-27 DIAGNOSIS — N189 Chronic kidney disease, unspecified: Secondary | ICD-10-CM | POA: Insufficient documentation

## 2020-05-27 DIAGNOSIS — Z79899 Other long term (current) drug therapy: Secondary | ICD-10-CM | POA: Insufficient documentation

## 2020-05-27 DIAGNOSIS — D638 Anemia in other chronic diseases classified elsewhere: Secondary | ICD-10-CM

## 2020-05-27 DIAGNOSIS — D631 Anemia in chronic kidney disease: Secondary | ICD-10-CM

## 2020-05-27 DIAGNOSIS — N1831 Chronic kidney disease, stage 3a: Secondary | ICD-10-CM

## 2020-05-27 LAB — CBC WITH DIFFERENTIAL (CANCER CENTER ONLY)
Abs Immature Granulocytes: 0.01 K/uL (ref 0.00–0.07)
Basophils Absolute: 0 K/uL (ref 0.0–0.1)
Basophils Relative: 1 %
Eosinophils Absolute: 0.4 K/uL (ref 0.0–0.5)
Eosinophils Relative: 8 %
HCT: 22.2 % — ABNORMAL LOW (ref 36.0–46.0)
Hemoglobin: 7.3 g/dL — ABNORMAL LOW (ref 12.0–15.0)
Immature Granulocytes: 0 %
Lymphocytes Relative: 26 %
Lymphs Abs: 1.2 K/uL (ref 0.7–4.0)
MCH: 27.5 pg (ref 26.0–34.0)
MCHC: 32.9 g/dL (ref 30.0–36.0)
MCV: 83.8 fL (ref 80.0–100.0)
Monocytes Absolute: 0.5 K/uL (ref 0.1–1.0)
Monocytes Relative: 9 %
Neutro Abs: 2.7 K/uL (ref 1.7–7.7)
Neutrophils Relative %: 56 %
Platelet Count: 292 K/uL (ref 150–400)
RBC: 2.65 MIL/uL — ABNORMAL LOW (ref 3.87–5.11)
RDW: 16.7 % — ABNORMAL HIGH (ref 11.5–15.5)
WBC Count: 4.8 K/uL (ref 4.0–10.5)
nRBC: 0 % (ref 0.0–0.2)

## 2020-05-27 LAB — CMP (CANCER CENTER ONLY)
ALT: 11 U/L (ref 0–44)
AST: 20 U/L (ref 15–41)
Albumin: 3.6 g/dL (ref 3.5–5.0)
Alkaline Phosphatase: 115 U/L (ref 38–126)
Anion gap: 10 (ref 5–15)
BUN: 38 mg/dL — ABNORMAL HIGH (ref 8–23)
CO2: 25 mmol/L (ref 22–32)
Calcium: 8.7 mg/dL — ABNORMAL LOW (ref 8.9–10.3)
Chloride: 87 mmol/L — ABNORMAL LOW (ref 98–111)
Creatinine: 1.48 mg/dL — ABNORMAL HIGH (ref 0.44–1.00)
GFR, Estimated: 39 mL/min — ABNORMAL LOW
Glucose, Bld: 339 mg/dL — ABNORMAL HIGH (ref 70–99)
Potassium: 4.3 mmol/L (ref 3.5–5.1)
Sodium: 122 mmol/L — ABNORMAL LOW (ref 135–145)
Total Bilirubin: 0.3 mg/dL (ref 0.3–1.2)
Total Protein: 5.9 g/dL — ABNORMAL LOW (ref 6.5–8.1)

## 2020-05-27 MED ORDER — DARBEPOETIN ALFA 300 MCG/0.6ML IJ SOSY
PREFILLED_SYRINGE | INTRAMUSCULAR | Status: AC
  Filled 2020-05-27: qty 0.6

## 2020-05-27 MED ORDER — DARBEPOETIN ALFA 300 MCG/0.6ML IJ SOSY
300.0000 ug | PREFILLED_SYRINGE | Freq: Once | INTRAMUSCULAR | Status: AC
  Administered 2020-05-27: 300 ug via SUBCUTANEOUS

## 2020-05-27 NOTE — Telephone Encounter (Signed)
Pt notified ., also stated she has been having high BP readings at home but hasn't kept a log , told her to document the readings and update [place] on Friday with readings

## 2020-05-27 NOTE — Telephone Encounter (Signed)
Lets schedule her in APP clinic-next available.  Sooner if there is any cancellation RG

## 2020-05-27 NOTE — Patient Instructions (Signed)
Darbepoetin Alfa injection What is this medicine? DARBEPOETIN ALFA (dar be POE e tin AL fa) helps your body make more red blood cells. It is used to treat anemia caused by chronic kidney failure and chemotherapy. This medicine may be used for other purposes; ask your health care provider or pharmacist if you have questions. COMMON BRAND NAME(S): Aranesp What should I tell my health care provider before I take this medicine? They need to know if you have any of these conditions:  blood clotting disorders or history of blood clots  cancer patient not on chemotherapy  cystic fibrosis  heart disease, such as angina, heart failure, or a history of a heart attack  hemoglobin level of 12 g/dL or greater  high blood pressure  low levels of folate, iron, or vitamin B12  seizures  an unusual or allergic reaction to darbepoetin, erythropoietin, albumin, hamster proteins, latex, other medicines, foods, dyes, or preservatives  pregnant or trying to get pregnant  breast-feeding How should I use this medicine? This medicine is for injection into a vein or under the skin. It is usually given by a health care professional in a hospital or clinic setting. If you get this medicine at home, you will be taught how to prepare and give this medicine. Use exactly as directed. Take your medicine at regular intervals. Do not take your medicine more often than directed. It is important that you put your used needles and syringes in a special sharps container. Do not put them in a trash can. If you do not have a sharps container, call your pharmacist or healthcare provider to get one. A special MedGuide will be given to you by the pharmacist with each prescription and refill. Be sure to read this information carefully each time. Talk to your pediatrician regarding the use of this medicine in children. While this medicine may be used in children as young as 1 month of age for selected conditions, precautions do  apply. Overdosage: If you think you have taken too much of this medicine contact a poison control center or emergency room at once. NOTE: This medicine is only for you. Do not share this medicine with others. What if I miss a dose? If you miss a dose, take it as soon as you can. If it is almost time for your next dose, take only that dose. Do not take double or extra doses. What may interact with this medicine? Do not take this medicine with any of the following medications:  epoetin alfa This list may not describe all possible interactions. Give your health care provider a list of all the medicines, herbs, non-prescription drugs, or dietary supplements you use. Also tell them if you smoke, drink alcohol, or use illegal drugs. Some items may interact with your medicine. What should I watch for while using this medicine? Your condition will be monitored carefully while you are receiving this medicine. You may need blood work done while you are taking this medicine. This medicine may cause a decrease in vitamin B6. You should make sure that you get enough vitamin B6 while you are taking this medicine. Discuss the foods you eat and the vitamins you take with your health care professional. What side effects may I notice from receiving this medicine? Side effects that you should report to your doctor or health care professional as soon as possible:  allergic reactions like skin rash, itching or hives, swelling of the face, lips, or tongue  breathing problems  changes in   vision  chest pain  confusion, trouble speaking or understanding  feeling faint or lightheaded, falls  high blood pressure  muscle aches or pains  pain, swelling, warmth in the leg  rapid weight gain  severe headaches  sudden numbness or weakness of the face, arm or leg  trouble walking, dizziness, loss of balance or coordination  seizures (convulsions)  swelling of the ankles, feet, hands  unusually weak or  tired Side effects that usually do not require medical attention (report to your doctor or health care professional if they continue or are bothersome):  diarrhea  fever, chills (flu-like symptoms)  headaches  nausea, vomiting  redness, stinging, or swelling at site where injected This list may not describe all possible side effects. Call your doctor for medical advice about side effects. You may report side effects to FDA at 1-800-FDA-1088. Where should I keep my medicine? Keep out of the reach of children. Store in a refrigerator between 2 and 8 degrees C (36 and 46 degrees F). Do not freeze. Do not shake. Throw away any unused portion if using a single-dose vial. Throw away any unused medicine after the expiration date. NOTE: This sheet is a summary. It may not cover all possible information. If you have questions about this medicine, talk to your doctor, pharmacist, or health care provider.  2021 [employer] Standard (2017-01-25 16:44:20)  

## 2020-06-01 ENCOUNTER — Other Ambulatory Visit: Payer: Self-pay | Admitting: Family

## 2020-06-01 ENCOUNTER — Encounter: Payer: Self-pay | Admitting: Medical

## 2020-06-01 ENCOUNTER — Encounter: Payer: Self-pay | Admitting: Family

## 2020-06-01 DIAGNOSIS — N1831 Chronic kidney disease, stage 3a: Secondary | ICD-10-CM

## 2020-06-01 DIAGNOSIS — D631 Anemia in chronic kidney disease: Secondary | ICD-10-CM

## 2020-06-01 DIAGNOSIS — E860 Dehydration: Secondary | ICD-10-CM

## 2020-06-01 DIAGNOSIS — D508 Other iron deficiency anemias: Secondary | ICD-10-CM

## 2020-06-02 ENCOUNTER — Telehealth: Payer: Self-pay | Admitting: *Deleted

## 2020-06-02 ENCOUNTER — Other Ambulatory Visit: Payer: Self-pay | Admitting: Family

## 2020-06-02 ENCOUNTER — Telehealth: Payer: Self-pay

## 2020-06-02 ENCOUNTER — Emergency Department (HOSPITAL_BASED_OUTPATIENT_CLINIC_OR_DEPARTMENT_OTHER)
Admission: EM | Admit: 2020-06-02 | Discharge: 2020-06-02 | Disposition: A | Discharge status: Home / Self Care | Payer: No Typology Code available for payment source | Attending: Emergency Medicine | Admitting: Emergency Medicine

## 2020-06-02 ENCOUNTER — Other Ambulatory Visit: Payer: Self-pay | Admitting: *Deleted

## 2020-06-02 ENCOUNTER — Encounter (HOSPITAL_BASED_OUTPATIENT_CLINIC_OR_DEPARTMENT_OTHER): Payer: Self-pay

## 2020-06-02 ENCOUNTER — Other Ambulatory Visit: Payer: Self-pay

## 2020-06-02 ENCOUNTER — Inpatient Hospital Stay: Payer: No Typology Code available for payment source

## 2020-06-02 DIAGNOSIS — Z7982 Long term (current) use of aspirin: Secondary | ICD-10-CM | POA: Diagnosis not present

## 2020-06-02 DIAGNOSIS — D508 Other iron deficiency anemias: Secondary | ICD-10-CM

## 2020-06-02 DIAGNOSIS — N183 Chronic kidney disease, stage 3 unspecified: Secondary | ICD-10-CM | POA: Insufficient documentation

## 2020-06-02 DIAGNOSIS — I2510 Atherosclerotic heart disease of native coronary artery without angina pectoris: Secondary | ICD-10-CM | POA: Insufficient documentation

## 2020-06-02 DIAGNOSIS — E11319 Type 2 diabetes mellitus with unspecified diabetic retinopathy without macular edema: Secondary | ICD-10-CM | POA: Insufficient documentation

## 2020-06-02 DIAGNOSIS — I5032 Chronic diastolic (congestive) heart failure: Secondary | ICD-10-CM | POA: Diagnosis not present

## 2020-06-02 DIAGNOSIS — D649 Anemia, unspecified: Secondary | ICD-10-CM

## 2020-06-02 DIAGNOSIS — J45909 Unspecified asthma, uncomplicated: Secondary | ICD-10-CM | POA: Insufficient documentation

## 2020-06-02 DIAGNOSIS — E871 Hypo-osmolality and hyponatremia: Secondary | ICD-10-CM

## 2020-06-02 DIAGNOSIS — Z794 Long term (current) use of insulin: Secondary | ICD-10-CM | POA: Insufficient documentation

## 2020-06-02 DIAGNOSIS — I130 Hypertensive heart and chronic kidney disease with heart failure and stage 1 through stage 4 chronic kidney disease, or unspecified chronic kidney disease: Secondary | ICD-10-CM | POA: Diagnosis not present

## 2020-06-02 DIAGNOSIS — Z951 Presence of aortocoronary bypass graft: Secondary | ICD-10-CM | POA: Diagnosis not present

## 2020-06-02 DIAGNOSIS — E1122 Type 2 diabetes mellitus with diabetic chronic kidney disease: Secondary | ICD-10-CM | POA: Insufficient documentation

## 2020-06-02 DIAGNOSIS — N1831 Chronic kidney disease, stage 3a: Secondary | ICD-10-CM

## 2020-06-02 DIAGNOSIS — D631 Anemia in chronic kidney disease: Secondary | ICD-10-CM

## 2020-06-02 DIAGNOSIS — Z79899 Other long term (current) drug therapy: Secondary | ICD-10-CM | POA: Insufficient documentation

## 2020-06-02 DIAGNOSIS — Z87891 Personal history of nicotine dependence: Secondary | ICD-10-CM | POA: Diagnosis not present

## 2020-06-02 DIAGNOSIS — E860 Dehydration: Secondary | ICD-10-CM

## 2020-06-02 DIAGNOSIS — E039 Hypothyroidism, unspecified: Secondary | ICD-10-CM | POA: Diagnosis not present

## 2020-06-02 LAB — CBC WITH DIFFERENTIAL (CANCER CENTER ONLY)
Abs Immature Granulocytes: 0.04 10*3/uL (ref 0.00–0.07)
Basophils Absolute: 0 10*3/uL (ref 0.0–0.1)
Basophils Relative: 1 %
Eosinophils Absolute: 0.4 10*3/uL (ref 0.0–0.5)
Eosinophils Relative: 8 %
HCT: 22.7 % — ABNORMAL LOW (ref 36.0–46.0)
Hemoglobin: 7.5 g/dL — ABNORMAL LOW (ref 12.0–15.0)
Immature Granulocytes: 1 %
Lymphocytes Relative: 19 %
Lymphs Abs: 0.9 10*3/uL (ref 0.7–4.0)
MCH: 27.9 pg (ref 26.0–34.0)
MCHC: 33 g/dL (ref 30.0–36.0)
MCV: 84.4 fL (ref 80.0–100.0)
Monocytes Absolute: 0.5 10*3/uL (ref 0.1–1.0)
Monocytes Relative: 11 %
Neutro Abs: 2.8 10*3/uL (ref 1.7–7.7)
Neutrophils Relative %: 60 %
Platelet Count: 318 10*3/uL (ref 150–400)
RBC: 2.69 MIL/uL — ABNORMAL LOW (ref 3.87–5.11)
RDW: 17.2 % — ABNORMAL HIGH (ref 11.5–15.5)
WBC Count: 4.7 10*3/uL (ref 4.0–10.5)
nRBC: 0 % (ref 0.0–0.2)

## 2020-06-02 LAB — CMP (CANCER CENTER ONLY)
ALT: 12 U/L (ref 0–44)
AST: 19 U/L (ref 15–41)
Albumin: 3.4 g/dL — ABNORMAL LOW (ref 3.5–5.0)
Alkaline Phosphatase: 103 U/L (ref 38–126)
Anion gap: 7 (ref 5–15)
BUN: 21 mg/dL (ref 8–23)
CO2: 24 mmol/L (ref 22–32)
Calcium: 8.9 mg/dL (ref 8.9–10.3)
Chloride: 91 mmol/L — ABNORMAL LOW (ref 98–111)
Creatinine: 1.17 mg/dL — ABNORMAL HIGH (ref 0.44–1.00)
GFR, Estimated: 52 mL/min — ABNORMAL LOW (ref >60)
Glucose, Bld: 168 mg/dL — ABNORMAL HIGH (ref 70–99)
Potassium: 4.8 mmol/L (ref 3.5–5.1)
Sodium: 122 mmol/L — ABNORMAL LOW (ref 135–145)
Total Bilirubin: 0.3 mg/dL (ref 0.3–1.2)
Total Protein: 5.5 g/dL — ABNORMAL LOW (ref 6.5–8.1)

## 2020-06-02 LAB — RETICULOCYTES
Immature Retic Fract: 24 % — ABNORMAL HIGH (ref 2.3–15.9)
RBC.: 2.7 MIL/uL — ABNORMAL LOW (ref 3.87–5.11)
Retic Count, Absolute: 59.1 10*3/uL (ref 19.0–186.0)
Retic Ct Pct: 2.2 % (ref 0.4–3.1)

## 2020-06-02 LAB — SAMPLE TO BLOOD BANK

## 2020-06-02 LAB — PREPARE RBC (CROSSMATCH)

## 2020-06-02 MED ORDER — SODIUM CHLORIDE 0.9 % IV BOLUS
500.0000 mL | Freq: Once | INTRAVENOUS | Status: AC
  Administered 2020-06-02: 500 mL via INTRAVENOUS

## 2020-06-02 NOTE — Telephone Encounter (Signed)
Jory Ee NP notified of [place].  Order received for pt to go to the ER now. Call placed to patient and patient informed to go to the ER now per order of S. [place] NP.  Pt states that she is in the parking lot of [facility] ER and will go to that ER now.

## 2020-06-02 NOTE — ED Triage Notes (Signed)
Pt states she was advised by oncology to come to ED due to low sodium level drawn today-NAD-to triage in w/c

## 2020-06-02 NOTE — ED Notes (Signed)
IV Fluid Bolus completed, AVS again reviewed with pt, pt ready for DC to home post fluid bolus per ED Providers orders. Opportunity for questions provided

## 2020-06-02 NOTE — Telephone Encounter (Signed)
appts made, calendar pritned and transport sch to pick her up 9:30-10

## 2020-06-02 NOTE — ED Provider Notes (Signed)
[facility] EMERGENCY DEPARTMENT Provider Note   CSN: [id number] Arrival date & time: 06/02/20  1440     History Chief Complaint  Patient presents with  . Abnormal Lab    Brittney Pace is a 65 y.o. female.  The history is provided by the patient and medical records.  Abnormal Lab  Brittney Pace is a 65 y.o. female who presents to the Emergency Department complaining of abnormal lab.  She referred to the ED from the cancer center for low sodium on outpatient labs.  She reports feeling lightheaded - ongoing issue but worsening today.    Temp is 98- high for her.  No chest pain, sob.  Gets abdominal pain occasionally - chronic issue.  Has constipation.   Vomited on Sunday - small amount.  Urinating well.  Has lower extremity edema, chronic problem, worsening for last several weeks.    Takes torsemide as needed - took one in the last week.   Takes spironolactone - increased one month ago.  Drinks 1-2 beers daily    Past Medical History:  Diagnosis Date  . Anemia   . Arthritis   . 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 CHF (congestive heart failure) ([place])   . Chronic kidney disease    stage 3  . Coronary artery disease   . Depression   . Diabetes mellitus without complication (HCC)    Type 1  . Diabetic retinopathy ([place])   . 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 Family history of adverse reaction to anesthesia    mother: " while she was under she stopped breathing."  . Fibromyalgia   . Gastroparesis   . GERD (gastroesophageal reflux disease)   . Headache    migraines  . Hypothyroidism   . IBS (irritable bowel syndrome)   . Idiopathic edema   . Iron deficiency anemia 09/04/2019  . Lyme disease   . Mitral valve prolapse   . Myocardial infarction ([place])    1 major in 1999 and 2 minor " small vessel disease."  . Osteoporosis   . Peripheral neuropathy   . Peripheral vascular disease ([place])   . Sinus disorder    resistant "staph"  bacteria in her sinuses  . Stroke [facility])    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" " the second stroke was a lacunar     Patient Active Problem List   Diagnosis Date Noted  . S/P ORIF (open reduction internal fixation) fracture 04/30/2020  . Chronic migraine without aura, with intractable migraine, so stated, with status migrainosus 04/01/2020  . Herpes genitalis 11/07/2019  . History of multiple cerebrovascular accidents (CVAs) 10/30/2019  . Iron deficiency anemia 09/04/2019  . ETD (Eustachian tube dysfunction), bilateral 08/22/2019  . History of hyperbaric oxygen therapy 08/22/2019  . DNR (do not resuscitate) 12/10/2018  . OAB (overactive bladder) 11/20/2018  . Recurrent UTI 11/20/2018  . Vaginal atrophy 11/20/2018  . Voiding dysfunction 11/20/2018  . Dizziness 10/22/2018  . Ear fullness, bilateral 10/22/2018  . Tinnitus, right 10/22/2018  . 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 Arthritis 02/23/2018  . Asthma 02/23/2018  . Carpal tunnel syndrome 02/23/2018  . Gastroesophageal reflux disease 02/23/2018  . Kidney disease 02/23/2018  . Seasonal affective disorder ([place]) 02/23/2018  . Electrolyte abnormality 02/20/2017  . Sepsis secondary to UTI ([place]) 02/15/2017  . Sepsis with metabolic encephalopathy ([place]) 02/15/2017  . Hypertension associated with diabetes ([place]) 02/14/2017  . Encephalopathy acute 12/23/2016  . Brain atrophy ([place]) 05/24/2016  .  Chronic headache 05/24/2016  . Postmenopausal osteoporosis 05/03/2016  . Neuropathy 04/20/2016  . Lacunar stroke ([place]) 01/20/2016  . Memory loss 01/20/2016  . Other fatigue 01/20/2016  . CAD (coronary artery disease) of artery bypass graft 01/20/2016  . Depression 01/20/2016  . Right lateral epicondylitis 12/16/2015  . 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 Lyme disease 12/16/2015  . Small intestinal bacterial overgrowth 11/19/2015  . Hyperlipidemia 09/25/2015  . Abdominal bloating 09/04/2015  . Closed displaced intra-articular fracture of  right calcaneus 06/16/2015  . Myopia of both eyes with astigmatism and presbyopia 05/21/2015  . Vitreous syneresis of both eyes 05/21/2015  . Combined form of age-related cataract, left eye 05/21/2015  . 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 Pseudophakia, right eye 05/21/2015  . Surgery, elective 05/06/2015  . CAD (coronary artery disease) 11/19/2014  . Diabetes mellitus type I ([place]) 11/19/2014  . DDD (degenerative disc disease), cervical 03/11/2014  . Cervical spondylosis with radiculopathy 03/11/2014  . Spondylolisthesis of cervical region 03/11/2014  . Vitreous hemorrhage of left eye ([place]) 03/03/2014  . DDD (degenerative disc disease), thoracic 01/07/2014  . Anemia due to stage 3 chronic kidney disease ([place]) 12/16/2013  . Osteoporosis 11/14/2013  . Chronic diastolic heart failure ([place]) 09/18/2013  . Cataract due to secondary diabetes ([place]) 08/26/2013  . 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 associated with type 2 diabetes mellitus ([place]) 08/26/2013  . Secondary diabetes mellitus with ophthalmic complication ([place]) 54/65/6812  . Type 1 diabetes mellitus with hyperosmolar coma ([place]) 08/26/2013  . Hypothyroidism due to acquired atrophy of thyroid 08/02/2013  . Acquired hypothyroidism 08/02/2013  . Cerebral artery occlusion with cerebral infarction ([place]) 07/14/2013  . History of diabetic gastroparesis 04/09/2013  . Irritable bowel syndrome with diarrhea 04/09/2013  . Diarrhea 04/09/2013  . History of endocrine, metabolic or immunity disorder 04/09/2013  . Anemia of chronic disease 04/08/2013  . 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 ([place]) 75/17/0017  . CKD stage 3 due to type 1 diabetes mellitus ([place]) 04/08/2013  . Anemia 04/08/2013    Past Surgical History:  Procedure Laterality Date  . ABDOMINAL HYSTERECTOMY    . APPENDECTOMY    . BREAST SURGERY     B/L biopsy and lumpectomy   . CARDIAC CATHETERIZATION    .  CARPAL TUNNEL RELEASE    . CATARACT EXTRACTION W/ INTRAOCULAR LENS IMPLANT     right eye  . COLONOSCOPY W/ BIOPSIES AND POLYPECTOMY    . CORONARY ARTERY BYPASS GRAFT    . coronary artery stents     at [facility] and [place]  . ECTOPIC PREGNANCY SURGERY    . NASAL SEPTUM SURGERY    . OPEN REDUCTION INTERNAL FIXATION (ORIF) DISTAL RADIAL FRACTURE Right 05/06/2015   Procedure: OPEN REDUCTION INTERNAL FIXATION (ORIF) RIGHT DISTAL RADIAL FRACTURE AND REPAIRS AS NEEDED;  Surgeon: Iran Planas, MD;  Location: [facility];  Service: Orthopedics;  Laterality: Right;  . ORIF HUMERUS FRACTURE Right 04/30/2020   Procedure: OPEN REDUCTION INTERNAL FIXATION (ORIF) PROXIMAL HUMERUS FRACTURE;  Surgeon: Justice Britain, MD;  Location: [facility];  Service: Orthopedics;  Laterality: Right;  120min  . ORIF WRIST FRACTURE Left 11/05/2014  . ORIF WRIST FRACTURE Left 11/05/2014   Procedure: OPEN REDUCTION INTERNAL FIXATION (ORIF) LEFT WRIST FRACTURE AND REPAIR AS INDICATED;  Surgeon: Iran Planas, MD;  Location: [facility];  Service: Orthopedics;  Laterality: Left;  . TRIGGER FINGER RELEASE       OB History   No obstetric history on file.  Family History  Problem Relation Age of Onset  . Breast cancer Mother   . Migraines Mother   . Heart disease Father   . Hypertension Father   . Breast cancer Sister     Social History   Tobacco Use  . Smoking status: Former Smoker    Types: Cigarettes  . Smokeless tobacco: Never Used  . Tobacco comment:  " Quit smoking cigarettes in 20's "  Vaping Use  . Vaping Use: Never used  Substance Use Topics  . Alcohol use: Yes    Comment: occasional beer or wine  . Drug use: No    Home Medications Prior to Admission medications   Medication Sig Start Date End Date Taking? Authorizing Provider  AMBULATORY NON FORMULARY MEDICATION Incontinence pads per patient preference, #unlimited, refill x99 Fax to [phone] 12/25/18   Emeterio Reeve, DO  Armodafinil 250 MG tablet Take 1 tablet  (250 mg total) by mouth daily. OR can take 0.5 tablet (125 mg) po bid 05/07/20   Emeterio Reeve, DO  aspirin EC 325 MG tablet Take 1 tablet (325 mg total) by mouth daily. Patient taking differently: Take 325 mg by mouth at bedtime. 03/06/19   Lelon Perla, MD  carvedilol (COREG) 12.5 MG tablet TAKE 1 TABLET BY MOUTH TWICE DAILY WITH MEALS 05/19/20   Lelon Perla, MD  Continuous Blood Gluc Sensor (DEXCOM G6 SENSOR) MISC USE TO CONTINUOUSLY MONITOR BLOOD SUGARS. CHANGE EVERY 10 DAYS 04/02/19   [provider]  cyclobenzaprine (FLEXERIL) 10 MG tablet Take 0.5-1 tablets (5-10 mg total) by mouth 3 (three) times daily as needed for muscle spasms. Caution: can cause drowsiness 05/01/20   Shuford, Olivia Mackie, PA-C  diclofenac Sodium (VOLTAREN) 1 % GEL Apply 2 g topically daily as needed (Pain).    [provider]  diphenhydrAMINE (BENADRYL) 25 MG tablet Take 25 mg by mouth at bedtime.    [provider]  diphenoxylate-atropine (LOMOTIL) 2.5-0.025 MG tablet TAKE 1 TABLET BY MOUTH 4 TIMES DAILY AS NEEDED FOR  DIARRHEA  OR  LOOSE  STOOLS Patient taking differently: Take 1 tablet by mouth daily as needed for diarrhea or loose stools. 01/14/20   [name], MD  esomeprazole (NEXIUM) 20 MG capsule Take 20 mg by mouth every morning.     [provider]  fluocinonide (LIDEX) 0.05 % external solution Apply 1 application topically See admin instructions. Once every three days    [provider]  gabapentin (NEURONTIN) 100 MG capsule Take 1-2 capsules (100-200 mg total) by mouth 3 (three) times daily as needed. Patient taking differently: Take 100-200 mg by mouth 2 (two) times daily. Pain and headache 10/04/19   Samuel Bouche, NP  Galcanezumab-gnlm (EMGALITY) 120 MG/ML SOAJ Inject 120 mg into the skin every 30 (thirty) days. 05/14/20   Melvenia Beam, MD  Insulin Human (INSULIN PUMP) SOLN Inject into the skin continuous. Novolog insulin    [provider]   ketoconazole (NIZORAL) 2 % shampoo APPLY 1 APPLICATION TOPICALLY 2 (TWO) TIMES A WEEK. 03/09/20   Emeterio Reeve, DO  lidocaine-prilocaine (EMLA) cream Apply 1 application topically every 2 (two) hours as needed. Please run with good Rx discount coupon 11/08/19   Emeterio Reeve, DO  Melatonin 5 MG CHEW Chew 5 mg by mouth daily as needed (Sleep).    [provider]  nitroGLYCERIN (NITROLINGUAL) 0.4 MG/SPRAY spray Place 1 spray under the tongue every 5 (five) minutes x 3 doses as needed for chest pain. 03/06/19  Lelon Perla, MD  ondansetron (ZOFRAN) 4 MG tablet Take 1 tablet (4 mg total) by mouth every 8 (eight) hours as needed for nausea or vomiting. 05/01/20   Shuford, Olivia Mackie, PA-C  OVER THE COUNTER MEDICATION as directed. Magnesium L-Treonate    [provider]  oxycodone (OXY-IR) 5 MG capsule Take 1 capsule (5 mg total) by mouth every 4 (four) hours as needed. 05/01/20   Shuford, Olivia Mackie, PA-C  Pancrelipase, Lip-Prot-Amyl, (CREON) 3000-9500 units CPEP Take 2 capsules by mouth 2 (two) times daily with a meal. Amylase 15000 units 05/20/19   [provider]  promethazine (PHENERGAN) 25 MG tablet Take 1 tablet (25 mg total) by mouth every 6 (six) hours as needed for nausea or vomiting. 05/01/20   Shuford, Olivia Mackie, PA-C  spironolactone (ALDACTONE) 25 MG tablet Take 50 mg by mouth daily. 05/09/19   [provider]  thyroid (ARMOUR THYROID) 60 MG tablet Take 1 tablet (60 mg total) by mouth daily before breakfast. 04/02/19   Emeterio Reeve, DO  torsemide ([name]) 20 MG tablet TAKE 1 TABLET ONCE DAILY INTHE MORNING. MAY TAKE AN   EXTRA 1/2 TABLET AS        NEEDED Patient taking differently: Take 20 mg by mouth daily as needed (Water retention). 01/15/18   Trixie Dredge, PA-C  valACYclovir (VALTREX) 500 MG tablet Take 500 mg by mouth daily as needed (Flair up).    [provider]    Allergies    Morphine, Morphine and related, Penicillins,  Tramadol, Tramadol-acetaminophen, Acetaminophen, Amitriptyline, Codeine, Ibuprofen, Losartan, Propoxyphene, Shellfish allergy, Statins, Sulfamethoxazole, Trichophyton, and Krill oil  Review of Systems   Review of Systems  All other systems reviewed and are negative.   Physical Exam Updated Vital Signs BP (!) 144/89 (BP Location: Right Arm)   Pulse 81   Temp 98.1 F (36.7 C) (Oral)   Resp 15   SpO2 100%   Physical Exam Vitals and nursing note reviewed.  Constitutional:      Appearance: She is well-developed.  HENT:     Head: Normocephalic and atraumatic.  Cardiovascular:     Rate and Rhythm: Normal rate and regular rhythm.     Heart sounds: No murmur heard.   Pulmonary:     Effort: Pulmonary effort is normal. No respiratory distress.     Breath sounds: Normal breath sounds.  Abdominal:     Palpations: Abdomen is soft.     Tenderness: There is no abdominal tenderness. There is no guarding or rebound.  Musculoskeletal:        General: Swelling present. No tenderness.     Comments: 1+ edema to BLE to ankle/mid shin  Skin:    General: Skin is warm and dry.  Neurological:     Mental Status: She is alert and oriented to person, place, and time.  Psychiatric:        Behavior: Behavior normal.     ED Results / Procedures / Treatments   Labs (all labs ordered are listed, but only abnormal results are displayed) Labs Reviewed - No data to display  EKG None  Radiology No results found.  Procedures Procedures   Medications Ordered in ED Medications  sodium chloride 0.9 % bolus 500 mL (0 mLs Intravenous Stopped 06/02/20 1656)    ED Course  I have reviewed the triage vital signs and the nursing notes.  Pertinent labs & imaging results that were available during my care of the patient were reviewed by me and considered in my  medical decision making (see chart for details).    MDM Rules/Calculators/A&P                         patient referred to the emergency  department for hyponatremia on outpatient labs, similar sodium level one week ago. Patient has chronic lightheadedness and states that this is been an ongoing issue but worsening lately. She is scheduled for blood transfusion tomorrow for progressive anemia. Source of her hyponatremia is multifactorial. Do feel that she would benefit from IV fluid hydration. Initially patient very hesitant to receive IV fluids but on discussion she is agreeable to fluid bolus with return tomorrow for blood transfusion. Discussed hyponatremia and return precautions.  Final Clinical Impression(s) / ED Diagnoses Final diagnoses:  Hyponatremia    Rx / DC Orders ED Discharge Orders    None       Quintella Reichert, MD 06/02/20 [id number]

## 2020-06-03 ENCOUNTER — Inpatient Hospital Stay: Payer: No Typology Code available for payment source

## 2020-06-03 DIAGNOSIS — D649 Anemia, unspecified: Secondary | ICD-10-CM

## 2020-06-03 DIAGNOSIS — N189 Chronic kidney disease, unspecified: Secondary | ICD-10-CM | POA: Diagnosis not present

## 2020-06-03 LAB — FERRITIN: Ferritin: 125 ng/mL (ref 11–307)

## 2020-06-03 LAB — IRON AND TIBC
Iron: 36 ug/dL — ABNORMAL LOW (ref 41–142)
Saturation Ratios: 13 % — ABNORMAL LOW (ref 21–57)
TIBC: 284 ug/dL (ref 236–444)
UIBC: 247 ug/dL (ref 120–384)

## 2020-06-03 MED ORDER — DIPHENHYDRAMINE HCL 25 MG PO CAPS
ORAL_CAPSULE | ORAL | Status: AC
  Filled 2020-06-03: qty 1

## 2020-06-03 MED ORDER — DIPHENHYDRAMINE HCL 25 MG PO CAPS
25.0000 mg | ORAL_CAPSULE | Freq: Once | ORAL | Status: AC
  Administered 2020-06-03: 25 mg via ORAL

## 2020-06-03 MED ORDER — SODIUM CHLORIDE 0.9% IV SOLUTION
250.0000 mL | Freq: Once | INTRAVENOUS | Status: DC
  Filled 2020-06-03: qty 250

## 2020-06-03 NOTE — Patient Instructions (Signed)

## 2020-06-04 ENCOUNTER — Telehealth: Payer: Self-pay | Admitting: Medical

## 2020-06-04 LAB — TYPE AND SCREEN
ABO/RH(D): O NEG
Antibody Screen: NEGATIVE
Unit division: 0
Unit division: 0

## 2020-06-04 LAB — BPAM RBC
Blood Product Expiration Date: 202206062359
Blood Product Expiration Date: 202206132359
ISSUE DATE / TIME: 202205110733
ISSUE DATE / TIME: 202205110733
Unit Type and Rh: 9500
Unit Type and Rh: 9500

## 2020-06-04 NOTE — Telephone Encounter (Signed)
Opened to review 

## 2020-06-04 NOTE — Telephone Encounter (Signed)
Pt asking to see GI MD with [facility] or [facility]. She had former with [facility]. She has definitie preference [facility].    There is delay in getting response from Dr. Lyndel Safe office. Will you call them and ask on status.   If he won't can some else with [facility] or [facility] see pt.

## 2020-06-09 ENCOUNTER — Other Ambulatory Visit: Payer: Self-pay | Admitting: Osteopathic Medicine

## 2020-06-12 ENCOUNTER — Ambulatory Visit: Payer: No Typology Code available for payment source | Admitting: Medical

## 2020-06-16 ENCOUNTER — Inpatient Hospital Stay: Payer: No Typology Code available for payment source

## 2020-06-16 ENCOUNTER — Other Ambulatory Visit: Payer: Self-pay

## 2020-06-16 DIAGNOSIS — D631 Anemia in chronic kidney disease: Secondary | ICD-10-CM

## 2020-06-16 DIAGNOSIS — N1831 Chronic kidney disease, stage 3a: Secondary | ICD-10-CM

## 2020-06-16 DIAGNOSIS — N189 Chronic kidney disease, unspecified: Secondary | ICD-10-CM | POA: Diagnosis not present

## 2020-06-16 LAB — CBC WITH DIFFERENTIAL (CANCER CENTER ONLY)
Abs Immature Granulocytes: 0.01 10*3/uL (ref 0.00–0.07)
Basophils Absolute: 0.1 10*3/uL (ref 0.0–0.1)
Basophils Relative: 1 %
Eosinophils Absolute: 0.4 10*3/uL (ref 0.0–0.5)
Eosinophils Relative: 6 %
HCT: 37.2 % (ref 36.0–46.0)
Hemoglobin: 11.9 g/dL — ABNORMAL LOW (ref 12.0–15.0)
Immature Granulocytes: 0 %
Lymphocytes Relative: 20 %
Lymphs Abs: 1.1 10*3/uL (ref 0.7–4.0)
MCH: 29 pg (ref 26.0–34.0)
MCHC: 32 g/dL (ref 30.0–36.0)
MCV: 90.7 fL (ref 80.0–100.0)
Monocytes Absolute: 0.8 10*3/uL (ref 0.1–1.0)
Monocytes Relative: 14 %
Neutro Abs: 3.3 10*3/uL (ref 1.7–7.7)
Neutrophils Relative %: 59 %
Platelet Count: 334 10*3/uL (ref 150–400)
RBC: 4.1 MIL/uL (ref 3.87–5.11)
RDW: 18.6 % — ABNORMAL HIGH (ref 11.5–15.5)
WBC Count: 5.6 10*3/uL (ref 4.0–10.5)
nRBC: 0 % (ref 0.0–0.2)

## 2020-06-16 LAB — CMP (CANCER CENTER ONLY)
ALT: 12 U/L (ref 0–44)
AST: 18 U/L (ref 15–41)
Albumin: 3.6 g/dL (ref 3.5–5.0)
Alkaline Phosphatase: 106 U/L (ref 38–126)
Anion gap: 7 (ref 5–15)
BUN: 22 mg/dL (ref 8–23)
CO2: 30 mmol/L (ref 22–32)
Calcium: 8.9 mg/dL (ref 8.9–10.3)
Chloride: 94 mmol/L — ABNORMAL LOW (ref 98–111)
Creatinine: 1.24 mg/dL — ABNORMAL HIGH (ref 0.44–1.00)
GFR, Estimated: 48 mL/min — ABNORMAL LOW (ref >60)
Glucose, Bld: 182 mg/dL — ABNORMAL HIGH (ref 70–99)
Potassium: 4.4 mmol/L (ref 3.5–5.1)
Sodium: 131 mmol/L — ABNORMAL LOW (ref 135–145)
Total Bilirubin: 0.4 mg/dL (ref 0.3–1.2)
Total Protein: 5.9 g/dL — ABNORMAL LOW (ref 6.5–8.1)

## 2020-06-19 ENCOUNTER — Encounter: Payer: Self-pay | Admitting: Medical

## 2020-06-19 ENCOUNTER — Ambulatory Visit (INDEPENDENT_AMBULATORY_CARE_PROVIDER_SITE_OTHER): Payer: No Typology Code available for payment source | Admitting: Medical

## 2020-06-19 ENCOUNTER — Other Ambulatory Visit: Payer: Self-pay

## 2020-06-19 ENCOUNTER — Ambulatory Visit (HOSPITAL_BASED_OUTPATIENT_CLINIC_OR_DEPARTMENT_OTHER)
Admission: RE | Admit: 2020-06-19 | Discharge: 2020-06-19 | Disposition: A | Discharge status: Home / Self Care | Payer: No Typology Code available for payment source | Source: Ambulatory Visit | Attending: Medical | Admitting: Medical

## 2020-06-19 VITALS — BP 185/85 | HR 63 | Temp 97.7°F | Resp 18 | Ht 59.0 in | Wt 98.4 lb

## 2020-06-19 DIAGNOSIS — R600 Localized edema: Secondary | ICD-10-CM

## 2020-06-19 DIAGNOSIS — I10 Essential (primary) hypertension: Secondary | ICD-10-CM

## 2020-06-19 DIAGNOSIS — R591 Generalized enlarged lymph nodes: Secondary | ICD-10-CM | POA: Diagnosis not present

## 2020-06-19 DIAGNOSIS — E871 Hypo-osmolality and hyponatremia: Secondary | ICD-10-CM | POA: Diagnosis not present

## 2020-06-19 DIAGNOSIS — E1022 Type 1 diabetes mellitus with diabetic chronic kidney disease: Secondary | ICD-10-CM | POA: Diagnosis not present

## 2020-06-19 DIAGNOSIS — E1059 Type 1 diabetes mellitus with other circulatory complications: Secondary | ICD-10-CM | POA: Diagnosis not present

## 2020-06-19 DIAGNOSIS — N183 Chronic kidney disease, stage 3 unspecified: Secondary | ICD-10-CM

## 2020-06-19 DIAGNOSIS — R21 Rash and other nonspecific skin eruption: Secondary | ICD-10-CM

## 2020-06-19 DIAGNOSIS — I2510 Atherosclerotic heart disease of native coronary artery without angina pectoris: Secondary | ICD-10-CM

## 2020-06-19 LAB — COMPREHENSIVE METABOLIC PANEL
ALT: 15 U/L (ref 0–35)
AST: 22 U/L (ref 0–37)
Albumin: 3.5 g/dL (ref 3.5–5.2)
Alkaline Phosphatase: 108 U/L (ref 39–117)
BUN: 28 mg/dL — ABNORMAL HIGH (ref 6–23)
CO2: 27 mEq/L (ref 19–32)
Calcium: 9 mg/dL (ref 8.4–10.5)
Chloride: 94 mEq/L — ABNORMAL LOW (ref 96–112)
Creatinine, Ser: 1.28 mg/dL — ABNORMAL HIGH (ref 0.40–1.20)
GFR: 43.83 mL/min — ABNORMAL LOW (ref >60.00)
Glucose, Bld: 233 mg/dL — ABNORMAL HIGH (ref 70–99)
Potassium: 5 mEq/L (ref 3.5–5.1)
Sodium: 129 mEq/L — ABNORMAL LOW (ref 135–145)
Total Bilirubin: 0.4 mg/dL (ref 0.2–1.2)
Total Protein: 6.1 g/dL (ref 6.0–8.3)

## 2020-06-19 LAB — CBC WITH DIFFERENTIAL/PLATELET
Basophils Absolute: 0.1 10*3/uL (ref 0.0–0.1)
Basophils Relative: 1.4 % (ref 0.0–3.0)
Eosinophils Absolute: 0.3 10*3/uL (ref 0.0–0.7)
Eosinophils Relative: 4 % (ref 0.0–5.0)
HCT: 36.9 % (ref 36.0–46.0)
Hemoglobin: 12.2 g/dL (ref 12.0–15.0)
Lymphocytes Relative: 15.2 % (ref 12.0–46.0)
Lymphs Abs: 1 10*3/uL (ref 0.7–4.0)
MCHC: 33 g/dL (ref 30.0–36.0)
MCV: 89.1 fl (ref 78.0–100.0)
Monocytes Absolute: 0.5 10*3/uL (ref 0.1–1.0)
Monocytes Relative: 7.5 % (ref 3.0–12.0)
Neutro Abs: 4.7 10*3/uL (ref 1.4–7.7)
Neutrophils Relative %: 71.9 % (ref 43.0–77.0)
Platelets: 392 10*3/uL (ref 150.0–400.0)
RBC: 4.15 Mil/uL (ref 3.87–5.11)
RDW: 18.9 % — ABNORMAL HIGH (ref 11.5–15.5)
WBC: 6.5 10*3/uL (ref 4.0–10.5)

## 2020-06-19 LAB — BRAIN NATRIURETIC PEPTIDE: Pro B Natriuretic peptide (BNP): 211 pg/mL — ABNORMAL HIGH (ref 0.0–100.0)

## 2020-06-19 MED ORDER — AMLODIPINE BESYLATE 5 MG PO TABS: ORAL_TABLET | ORAL | 0 refills | Status: DC

## 2020-06-19 NOTE — Progress Notes (Signed)
Subjective:    Patient ID: Brittney Pace, female    DOB: 02/27/1955, 65 y.o.   MRN: [id number]  HPI    Pt in for some swelling in both ankles.   Pt 3 days ago had metabolic panel that showed sodium 131. 2 weeks ago had sodium of 122.   ED evaluation 06-02-2020   Arrival date & time: 06/02/20  1440   History    Chief Complaint  Patient presents with  . Abnormal Lab    Brittney Pace is a 65 y.o. female.  The history is provided by the patient and medical records.  Abnormal Lab  Brittney Pace is a 65 y.o. female who presents to the Emergency Department complaining of abnormal lab.  She referred to the ED from the cancer center for low sodium on outpatient labs.  She reports feeling lightheaded - ongoing issue but worsening today.    Temp is 98- high for her.  No chest pain, sob.  Gets abdominal pain occasionally - chronic issue.  Has constipation.   Vomited on Sunday - small amount.  Urinating well.  Has lower extremity edema, chronic problem, worsening for last several weeks.    Takes torsemide as needed - took one in the last week.   Takes spironolactone - increased one month ago.  Drinks 1-2 beers daily   A/P from ED.  patient referred to the emergency department for hyponatremia on outpatient labs, similar sodium level one week ago. Patient has chronic lightheadedness and states that this is been an ongoing issue but worsening lately. She is scheduled for blood transfusion tomorrow for progressive anemia. Source of her hyponatremia is multifactorial. Do feel that she would benefit from IV fluid hydration. Initially patient very hesitant to receive IV fluids but on discussion she is agreeable to fluid bolus with return tomorrow for blood transfusion. Discussed hyponatremia and return precautions.  Pt has appointment with nephrologist July 15, 2020.   Pt appears alert and oriented. Pt has been hydrating well.    Pt las echo done in past for sob. Saw  Dr. Stanford Breed in the past.   History:  PMH:  Coronary artery disease. Mitral valve prolapse.  Stroke. PMH:  Myocardial infarction. Risk factors: Peripheral  Vascular Disease, Lyme Disease, Idiopathic Edema. Family history of  coronary artery disease. Former tobacco use. Hypertension. Diabetes  mellitus. Dyslipidemia.   -------------------------------------------------------------------  Study Conclusions   - Left ventricle: The cavity size was normal. Systolic function was  normal. The estimated ejection fraction was in the range of 55%  to 60%. Wall motion was normal; there were no regional wall  motion abnormalities. Left ventricular diastolic function  parameters were normal.  - Ventricular septum: Septal motion showed paradox.  - Mitral valve: Systolic bowing without prolapse. (&quot;prolapse&quot; is  seen in the 2-chamber view only, a nonspecific finding; there are  no myxomatous leaflet changes)    Pt has diabetes. A1c was 8.1.      Review of Systems  Constitutional: Negative for chills, fatigue and fever.  HENT: Negative for congestion and drooling.   Respiratory: Negative for cough, chest tightness, shortness of breath and wheezing.   Cardiovascular: Negative for chest pain and palpitations.  Gastrointestinal: Negative for abdominal pain, diarrhea and vomiting.  Genitourinary: Negative for dysuria, frequency and genital sores.  Musculoskeletal: Negative for back pain.  Skin: Negative for rash.  Neurological: Negative for dizziness and headaches.  Hematological: Negative for adenopathy. Does not bruise/bleed easily.  Psychiatric/Behavioral: Negative for behavioral problems, confusion and  dysphoric mood. The patient is not nervous/anxious.     Past Medical History:  Diagnosis Date  . Anemia   . Arthritis   . 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 CHF (congestive heart failure) ([place])   . Chronic kidney disease    stage 3  . Coronary  artery disease   . Depression   . Diabetes mellitus without complication (HCC)    Type 1  . Diabetic retinopathy ([place])   . 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 Family history of adverse reaction to anesthesia    mother: " while she was under she stopped breathing."  . Fibromyalgia   . Gastroparesis   . GERD (gastroesophageal reflux disease)   . Headache    migraines  . Hypothyroidism   . IBS (irritable bowel syndrome)   . Idiopathic edema   . Iron deficiency anemia 09/04/2019  . Lyme disease   . Mitral valve prolapse   . Myocardial infarction ([place])    1 major in 1999 and 2 minor " small vessel disease."  . Osteoporosis   . Peripheral neuropathy   . Peripheral vascular disease ([place])   . 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 Marital status: Married    Spouse name: Not on file  . Number of children: 0  . Years of education: college  . Highest education level: Not on file  Occupational History  . Occupation: Retired  Tobacco Use  . Smoking status: Former Smoker    Types: Cigarettes  . Smokeless tobacco: Never Used  . Tobacco comment:  " Quit smoking cigarettes in 20's "  Vaping Use  . Vaping Use: Never used  Substance and Sexual Activity  . Alcohol use: Yes    Comment: occasional beer or wine  . Drug use: No  . Sexual activity: Not on file  Other Topics Concern  . Not on file  Social History Narrative   Drinks 1-2  cups caffeine drinks a day    Right handed   Lives at home with husband and dog   Social Determinants of Health   Financial Resource Strain: Not on file  Food Insecurity: Not on file  Transportation Needs: Not on file  Physical Activity: Not on file  Stress: Not on file  Social Connections: Not on file  Intimate Partner Violence: Not on file    Past Surgical History:  Procedure  Laterality Date  . ABDOMINAL HYSTERECTOMY    . APPENDECTOMY    . BREAST SURGERY     B/L biopsy and lumpectomy   . CARDIAC CATHETERIZATION    . CARPAL TUNNEL RELEASE    . CATARACT EXTRACTION W/ INTRAOCULAR LENS IMPLANT     right eye  . COLONOSCOPY W/ BIOPSIES AND POLYPECTOMY    . CORONARY ARTERY BYPASS GRAFT    . coronary artery stents     at [place] and [place]  . ECTOPIC PREGNANCY SURGERY    . NASAL SEPTUM SURGERY    . OPEN REDUCTION INTERNAL FIXATION (ORIF) DISTAL RADIAL FRACTURE Right 05/06/2015   Procedure: OPEN REDUCTION INTERNAL FIXATION (ORIF) RIGHT DISTAL RADIAL FRACTURE AND REPAIRS AS NEEDED;  Surgeon: Iran Planas, MD;  Location: [facility];  Service: Orthopedics;  Laterality: Right;  . ORIF HUMERUS FRACTURE Right 04/30/2020  Procedure: OPEN REDUCTION INTERNAL FIXATION (ORIF) PROXIMAL HUMERUS FRACTURE;  Surgeon: Justice Britain, MD;  Location: [facility];  Service: Orthopedics;  Laterality: Right;  127min  . ORIF WRIST FRACTURE Left 11/05/2014  . ORIF WRIST FRACTURE Left 11/05/2014   Procedure: OPEN REDUCTION INTERNAL FIXATION (ORIF) LEFT WRIST FRACTURE AND REPAIR AS INDICATED;  Surgeon: Iran Planas, MD;  Location: [facility];  Service: Orthopedics;  Laterality: Left;  . TRIGGER FINGER RELEASE      Family History  Problem Relation Age of Onset  . Breast cancer Mother   . Migraines Mother   . Heart disease Father   . Hypertension Father   . Breast cancer Sister     Allergies  Allergen Reactions  . Morphine Anaphylaxis and Shortness Of Breath  . Morphine And Related Shortness Of Breath  . Penicillins Hives    Tolerated ANCEF on 04/30/20 Has patient had a PCN reaction causing immediate rash, facial/tongue/throat swelling, SOB or lightheadedness with hypotension: Yes Has patient had a PCN reaction causing severe rash involving mucus membranes or skin necrosis: No Has patient had a PCN reaction that required hospitalization No Has patient had a PCN reaction occurring within the last 10 years:  No If all of the above answers are "NO", then may proceed with Cephalosporin use.  . Tramadol Anaphylaxis  . Tramadol-Acetaminophen Other (See Comments)    Small vessel heart attack Other reaction(s): Cardiovascular Arrest (ALLERGY/intolerance), Other (See Comments)   . Acetaminophen Other (See Comments)    Alters insulin pump readings   . Amitriptyline Other (See Comments)    Severe headache/ out of body feeling  . Codeine Other (See Comments)    Severe headaches/ out of body feeling   . Ibuprofen Other (See Comments)    Messes up CGM reading on glucose monitor    . Losartan Cough  . Propoxyphene Other (See Comments)    Severe headaches / out of body feeling   . Shellfish Allergy Diarrhea and Nausea And Vomiting  . Statins Other (See Comments)    Muscle pains Other reaction(s): Other  . Sulfamethoxazole Other (See Comments)    Mouth ulcers   . Trichophyton Itching and Other (See Comments)    (fungus) sinusitis   . Krill Oil Diarrhea, Itching and Nausea And Vomiting    Current Outpatient Medications on File Prior to Visit  Medication Sig Dispense Refill  . AMBULATORY NON FORMULARY MEDICATION Incontinence pads per patient preference, #unlimited, refill x99 Fax to [phone] 1 Units prn  . Armodafinil 250 MG tablet Take 1 tablet (250 mg total) by mouth daily. OR can take 0.5 tablet (125 mg) po bid 90 tablet 0  . ARMOUR THYROID 60 MG tablet Take 1 tablet (60 mg total) by mouth daily before breakfast. 90 tablet PRN  . aspirin EC 325 MG tablet Take 1 tablet (325 mg total) by mouth daily. (Patient taking differently: Take 325 mg by mouth at bedtime.) 30 tablet 0  . carvedilol (COREG) 12.5 MG tablet TAKE 1 TABLET BY MOUTH TWICE DAILY WITH MEALS 60 tablet 0  . Continuous Blood Gluc Sensor (DEXCOM G6 SENSOR) MISC USE TO CONTINUOUSLY MONITOR BLOOD SUGARS. CHANGE EVERY 10 DAYS    . cyclobenzaprine (FLEXERIL) 10 MG tablet Take 0.5-1 tablets (5-10 mg total) by mouth 3 (three) times  daily as needed for muscle spasms. Caution: can cause drowsiness 40 tablet 1  . diclofenac Sodium (VOLTAREN) 1 % GEL Apply 2 g topically daily as needed (Pain).    Marland Kitchen diphenhydrAMINE (BENADRYL) 25 MG  tablet Take 25 mg by mouth at bedtime.    . diphenoxylate-atropine (LOMOTIL) 2.5-0.025 MG tablet TAKE 1 TABLET BY MOUTH 4 TIMES DAILY AS NEEDED FOR  DIARRHEA  OR  LOOSE  STOOLS (Patient taking differently: Take 1 tablet by mouth daily as needed for diarrhea or loose stools.) 90 tablet 0  . esomeprazole (NEXIUM) 20 MG capsule Take 20 mg by mouth every morning.     . fluocinonide (LIDEX) 0.05 % external solution Apply 1 application topically See admin instructions. Once every three days    . gabapentin (NEURONTIN) 100 MG capsule Take 1-2 capsules (100-200 mg total) by mouth 3 (three) times daily as needed. (Patient taking differently: Take 100-200 mg by mouth 2 (two) times daily. Pain and headache) 180 capsule 5  . Galcanezumab-gnlm (EMGALITY) 120 MG/ML SOAJ Inject 120 mg into the skin every 30 (thirty) days. 1 mL 5  . Insulin Human (INSULIN PUMP) SOLN Inject into the skin continuous. Novolog insulin    . ketoconazole (NIZORAL) 2 % shampoo APPLY 1 APPLICATION TOPICALLY 2 (TWO) TIMES A WEEK. 240 mL 1  . lidocaine-prilocaine (EMLA) cream Apply 1 application topically every 2 (two) hours as needed. Please run with good Rx discount coupon 30 g 0  . Melatonin 5 MG CHEW Chew 5 mg by mouth daily as needed (Sleep).    . nitroGLYCERIN (NITROLINGUAL) 0.4 MG/SPRAY spray Place 1 spray under the tongue every 5 (five) minutes x 3 doses as needed for chest pain. 12 g 1  . ondansetron (ZOFRAN) 4 MG tablet Take 1 tablet (4 mg total) by mouth every 8 (eight) hours as needed for nausea or vomiting. 10 tablet 0  . OVER THE COUNTER MEDICATION as directed. Magnesium L-Treonate    . oxycodone (OXY-IR) 5 MG capsule Take 1 capsule (5 mg total) by mouth every 4 (four) hours as needed. 30 capsule 0  . Pancrelipase, Lip-Prot-Amyl,  (CREON) 3000-9500 units CPEP Take 2 capsules by mouth 2 (two) times daily with a meal. Amylase 15000 units    . promethazine (PHENERGAN) 25 MG tablet Take 1 tablet (25 mg total) by mouth every 6 (six) hours as needed for nausea or vomiting. 10 tablet 0  . spironolactone (ALDACTONE) 25 MG tablet Take 50 mg by mouth daily.    Marland Kitchen torsemide ([name]) 20 MG tablet TAKE 1 TABLET ONCE DAILY INTHE MORNING. MAY TAKE AN   EXTRA 1/2 TABLET AS        NEEDED (Patient taking differently: Take 20 mg by mouth daily as needed (Water retention).) 135 tablet 0  . valACYclovir (VALTREX) 500 MG tablet Take 500 mg by mouth daily as needed (Flair up).     No current facility-administered medications on file prior to visit.    BP (!) 183/64   Pulse 63   Temp 97.7 F (36.5 C)   Resp 18   Ht 4\' 11"  (1.499 m)   Wt 98 lb 6.4 oz (44.6 kg)   SpO2 100%   BMI 19.87 kg/m      Objective:   Physical Exam   General Mental Status- Alert. General Appearance- Not in acute distress.   Skin General: Color- Normal Color. Moisture- Normal Moisture.  Neck Carotid Arteries- Normal color. Moisture- Normal Moisture. No carotid bruits. No JVD.  Chest and Lung Exam Auscultation: Breath Sounds:-Normal.  Cardiovascular Auscultation:Rythm- Regular. Murmurs & Other Heart Sounds:Auscultation of the heart reveals- No Murmurs.  Abdomen Inspection:-Inspeection Normal. Palpation/Percussion:Note:No mass. Palpation and Percussion of the abdomen reveal- Non Tender, Non Distended +  BS, no rebound or guarding.   Neurologic   Cranial Nerve exam:- CN III-XII intact(No nystagmus), symmetric smile. Strength:- 5/5 equal and symmetric strength both upper and lower extremities.   Bilateral- lower ext 1-2+ edema. Negative homans signs.    Assessment & Plan:  History of hypertension with elevated blood pressure level today.  No gross motor or sensory function deficits.  Continue spironolactone and Coreg.  History of dry cough with  losartan and history of CKD.  Decided ACE inhibitor and ARB not a good option.  Diuretics could worsen low sodium.  Increasing beta-blocker lower pulse and that is already low.  Decided best option is amlodipine 5 mg daily.  Although this might cause some pedal edema which you are already struggling with.   If you have any neurologic signs and symptoms over the weekend then recommend ED evaluation.  History of diabetes.  Continue insulin pump.  History of CKD.  Keep appointment with nephrologist next month.  Recent pedal edema.  Likely related to decreased use of diuretic.  However will get CMP, BNP and chest x-ray today.  For low sodium we will follow today's stat CMP.  Recommend you can hydrate with propel fitness water or sugar-free Gatorade.  Avoid overhydration with plain water.  Lightly salt foods.  However do not overdo it in light of your blood pressure today.  Mild right upper extremity/groin possible enlarged lymph node.  We will follow infection fighting cell count.  Note skin in this area does not appear infected.  Skin rash chronic on face. Refer to derm.   Follow-up next Wednesday or as needed.  Time spent with patient today was  45 minutes which consisted of chart review, discussing diagnosis, work up, treatment and documentation.  Mackie Pai, PA-C

## 2020-06-19 NOTE — Patient Instructions (Addendum)
History of hypertension with elevated blood pressure level today.  No gross motor or sensory function deficits.  Continue spironolactone and Coreg.  History of dry cough with losartan and history of CKD.  Decided ACE inhibitor and ARB not a good option.  Diuretics could worsen low sodium.  Increasing beta-blocker lower pulse and that is already low.  Decided best option is amlodipine 5 mg daily.  Although this might cause some pedal edema which you are already struggling with.   If you have any neurologic signs and symptoms over the weekend then recommend ED evaluation.  History of diabetes.  Continue insulin pump.  History of CKD.  Keep appointment with nephrologist next month.  Recent pedal edema.  Likely related to decreased use of diuretic.  However will get CMP, BNP and chest x-ray today.  For low sodium we will follow today's stat CMP.  Recommend you can hydrate with propel fitness water or sugar-free Gatorade.  Avoid overhydration with plain water.  Lightly salt foods.  However do not overdo it in light of your blood pressure today.  Mild right upper extremity/groin possible enlarged lymph node.  We will follow infection fighting cell count.  Note skin in this area does not appear infected.  Skin rash chronic on face. Refer to derm.   Follow-up next Wednesday or as needed.

## 2020-06-20 ENCOUNTER — Encounter: Payer: Self-pay | Admitting: Medical

## 2020-06-23 ENCOUNTER — Other Ambulatory Visit: Payer: Self-pay | Admitting: Cardiology

## 2020-06-23 DIAGNOSIS — I2510 Atherosclerotic heart disease of native coronary artery without angina pectoris: Secondary | ICD-10-CM

## 2020-06-29 ENCOUNTER — Ambulatory Visit: Payer: No Typology Code available for payment source | Admitting: Physician Assistant

## 2020-07-03 ENCOUNTER — Telehealth: Payer: Self-pay

## 2020-07-03 ENCOUNTER — Encounter: Payer: Self-pay | Admitting: Gastroenterology

## 2020-07-03 ENCOUNTER — Ambulatory Visit (INDEPENDENT_AMBULATORY_CARE_PROVIDER_SITE_OTHER): Payer: No Typology Code available for payment source | Admitting: Gastroenterology

## 2020-07-03 VITALS — BP 142/64 | HR 64 | Ht 59.0 in | Wt 97.0 lb

## 2020-07-03 DIAGNOSIS — K529 Noninfective gastroenteritis and colitis, unspecified: Secondary | ICD-10-CM | POA: Diagnosis not present

## 2020-07-03 MED ORDER — CLENPIQ 10-3.5-12 MG-GM -GM/160ML PO SOLN: 1.0000 | ORAL | 0 refills | Status: DC

## 2020-07-03 NOTE — Patient Instructions (Addendum)
It was a pleasure to meet you today. Based on our discussion, I am providing you with my recommendations below:  RECOMMENDATION(S):   COLONOSCOPY:   You have been scheduled for a colonoscopy. Please follow written instructions given to you at your visit today.   PREP:   Please pick up your prep supplies at the pharmacy within the next 1-3 days.  INHALERS:   If you use inhalers (even only as needed), please bring them with you on the day of your procedure.  INSULIN PUMP:  You will receive a call from our office regarding instructions for your insulin pump AFTER we have spoken to Dr. Hermelinda Dellen  COLONOSCOPY TIPS:  To reduce nausea and dehydration, stay well hydrated for 3-4 days prior to the exam.  To prevent skin/hemorrhoid irritation - prior to wiping, put A&Dointment or vaseline on the toilet paper. Keep a towel or pad on the bed.  BEFORE STARTING YOUR PREP, drink  64oz of clear liquids in the morning. This will help to flush the colon and will ensure you are well hydrated!!!!  NOTE - This is in addition to the fluids required for to complete your prep. Use of a flavored hard candy, such as grape Anise Salvo, can counteract some of the flavor of the prep and may prevent some nausea.   BMI:  If you are age 27 or older, your body mass index should be between 23-30. Your Body mass index is 19.59 kg/m. If this is out of the aforementioned range listed, please consider follow up with your Primary Care Provider.  MY CHART:  The [facility] providers would like to encourage you to use [facility] to communicate with providers for non-urgent requests or questions.  Due to long hold times on the telephone, sending your provider a message by [facility] may be a faster and more efficient way to get a response.  Please allow 48 business hours for a response.  Please remember that this is for non-urgent requests.   FOLLOW UP:  After your procedure, you will receive a call from my office staff  regarding my recommendation for follow up.  Thank you for trusting me with your gastrointestinal care!    Alonza Bogus, PA-C

## 2020-07-03 NOTE — Progress Notes (Signed)
07/03/2020 Brittney Pace [id number] 1955-06-03   HISTORY OF PRESENT ILLNESS: This is a 65 year old female who is new to our office.  She was referred here by Mackie Pai, PA-C, for evaluation of GERD and gastroparesis.  She is transferring her care here from Dr. Nyoka Cowden at [facility].  She has had care at multiple facilities over the years including in [place], [place], [place], and most recently [facility], now transferring here (accepted by Dr. Lyndel Safe).  Sounds like they are settling here as they purchased a house in [place].  She tells me that she has longstanding history of IBS.  She is type I diabetic.  She tells me that she had been diagnosed with gastroparesis years ago.  Was treated with Reglan and that did not really seem to work and she developed tardive dyskinesias from that.  When they moved here she tried to get in at [facility] to see about getting gastric pacemaker placed, but then she had a gastric emptying scan performed there in 2015 that was normal.  Most recent EGD and colonoscopy were performed there in 2017 and were noted to be unremarkable.  She is here today with mostly complains of explosive diarrhea.  It does not look like during her colonoscopy in 2017 that random biopsies were performed.  It looks like hydrogen breath test in 2017 showed increased foregut bacterial colonization but fell just short of official diagnosis of SIBO.  She was treated with Xifaxan and had some response to that.  She had an alpha 1 antitrypsin stool study and a pancreatic elastase stool study performed and 2021 that were normal.  She tells me though that her endocrinologist had empirically placed her on some Creon and she was on that at the time of the stool study.  She remains on Creon, but only at 12,000 units with meals.  Looks like that was actually recently increased to that dose.  She is on Imodium or Lomotil as needed.  She  reports some Lyme disease and babesiosis that went undiagnosed for several years.  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  She also reports that she had not tolerated antifungals in the past.  She has multiple medical problems as listed below, but include chronic kidney disease stage III, coronary artery disease, type 1 diabetes with diabetic retinopathy, history of Lyme disease and babesiasis, hypothyroidism, chronic migraines, IBS, chronic anemia thought due to her chronic kidney disease, history of stroke, fibromyalgia.  She reports EGD and colonoscopy in [place] in 2012 or so had a poor prep and she got violently ill when using Gatorade/MiraLAX.   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   Family history of adverse reaction to anesthesia    mother: " while she was under she stopped breathing."   Fibromyalgia    Gastroparesis    GERD (gastroesophageal reflux disease)    Headache  migraines   Hypothyroidism    IBS (irritable bowel syndrome)    Idiopathic edema    Iron deficiency anemia 09/04/2019   Lyme disease    Mitral valve prolapse    Myocardial infarction ([place])    1 major in 1999 and 2 minor " small vessel disease."   Osteoporosis    Peripheral neuropathy    Peripheral vascular disease (HCC)    Sinus disorder    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   Stroke [facility])    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" " the second stroke was a lacunar    Past Surgical History:  Procedure Laterality Date    ABDOMINAL HYSTERECTOMY     APPENDECTOMY     BREAST SURGERY     B/L biopsy and lumpectomy    CARDIAC CATHETERIZATION     CARPAL TUNNEL RELEASE     CATARACT EXTRACTION W/ INTRAOCULAR LENS IMPLANT     right eye   COLONOSCOPY W/ BIOPSIES AND POLYPECTOMY     CORONARY ARTERY BYPASS GRAFT     coronary artery stents     at [place] and [place]   ECTOPIC PREGNANCY SURGERY     NASAL SEPTUM SURGERY     OPEN REDUCTION INTERNAL FIXATION (ORIF) DISTAL RADIAL FRACTURE Right 05/06/2015   Procedure: OPEN REDUCTION INTERNAL FIXATION (ORIF) RIGHT DISTAL RADIAL FRACTURE AND REPAIRS AS NEEDED;  Surgeon: Iran Planas, MD;  Location: [facility];  Service: Orthopedics;  Laterality: Right;   ORIF HUMERUS FRACTURE Right 04/30/2020   Procedure: OPEN REDUCTION INTERNAL FIXATION (ORIF) PROXIMAL HUMERUS FRACTURE;  Surgeon: Justice Britain, MD;  Location: [facility];  Service: Orthopedics;  Laterality: Right;  166min   ORIF WRIST FRACTURE Left 11/05/2014   ORIF WRIST FRACTURE Left 11/05/2014   Procedure: OPEN REDUCTION INTERNAL FIXATION (ORIF) LEFT WRIST FRACTURE AND REPAIR AS INDICATED;  Surgeon: Iran Planas, MD;  Location: [facility];  Service: Orthopedics;  Laterality: Left;   TRIGGER FINGER RELEASE      reports that she has quit smoking. Her smoking use included cigarettes. She has never used smokeless tobacco. She reports current alcohol use. She reports that she does not use drugs. family history includes Breast cancer in her mother and sister; Heart disease in her father; Hypertension in her father; Migraines in her mother. Allergies  Allergen Reactions   Morphine Anaphylaxis and Shortness Of Breath   Morphine And Related Shortness Of Breath   Penicillins Hives    Tolerated ANCEF on 04/30/20 Has patient had a PCN reaction causing immediate rash, facial/tongue/throat swelling, SOB or lightheadedness with hypotension: Yes Has patient had a PCN reaction causing severe rash involving mucus membranes or skin necrosis: No Has patient had a  PCN reaction that required hospitalization No Has patient had a PCN reaction occurring within the last 10 years: No If all of the above answers are "NO", then may proceed with Cephalosporin use.   Tramadol Anaphylaxis   Tramadol-Acetaminophen Other (See Comments)    Small vessel heart attack Other reaction(s): Cardiovascular Arrest (ALLERGY/intolerance), Other (See Comments)    Acetaminophen Other (See Comments)    Alters insulin pump readings    Amitriptyline Other (See Comments)    Severe headache/ out of body feeling   Codeine Other (See Comments)    Severe headaches/ out of body feeling    Ibuprofen Other (See Comments)    Messes up CGM reading on glucose monitor     Losartan Cough   Propoxyphene Other (See Comments)    Severe headaches /  out of body feeling    Shellfish Allergy Diarrhea and Nausea And Vomiting   Statins Other (See Comments)    Muscle pains Other reaction(s): Other   Sulfamethoxazole Other (See Comments)    Mouth ulcers    Trichophyton Itching and Other (See Comments)    (fungus) sinusitis    Krill Oil Diarrhea, Itching and Nausea And Vomiting      Outpatient Encounter Medications as of 07/03/2020  Medication Sig   AMBULATORY NON FORMULARY MEDICATION Incontinence pads per patient preference, #unlimited, refill x99 Fax to [phone]   amLODipine (NORVASC) 5 MG tablet 1 tab po q day   Armodafinil 250 MG tablet Take 1 tablet (250 mg total) by mouth daily. OR can take 0.5 tablet (125 mg) po bid   ARMOUR THYROID 60 MG tablet Take 1 tablet (60 mg total) by mouth daily before breakfast.   aspirin EC 325 MG tablet Take 1 tablet (325 mg total) by mouth daily. (Patient taking differently: Take 325 mg by mouth at bedtime.)   carvedilol (COREG) 12.5 MG tablet TAKE 1 TABLET BY MOUTH TWICE DAILY WITH MEALS   Continuous Blood Gluc Sensor (DEXCOM G6 SENSOR) MISC USE TO CONTINUOUSLY MONITOR BLOOD SUGARS. CHANGE EVERY 10 DAYS   cyclobenzaprine (FLEXERIL) 10 MG  tablet Take 0.5-1 tablets (5-10 mg total) by mouth 3 (three) times daily as needed for muscle spasms. Caution: can cause drowsiness   diclofenac Sodium (VOLTAREN) 1 % GEL Apply 2 g topically daily as needed (Pain).   diphenhydrAMINE (BENADRYL) 25 MG tablet Take 25 mg by mouth at bedtime.   diphenoxylate-atropine (LOMOTIL) 2.5-0.025 MG tablet TAKE 1 TABLET BY MOUTH 4 TIMES DAILY AS NEEDED FOR  DIARRHEA  OR  LOOSE  STOOLS (Patient taking differently: Take 1 tablet by mouth daily as needed for diarrhea or loose stools.)   esomeprazole (NEXIUM) 20 MG capsule Take 20 mg by mouth every morning.    fluocinonide (LIDEX) 0.05 % external solution Apply 1 application topically See admin instructions. Once every three days   gabapentin (NEURONTIN) 100 MG capsule Take 1-2 capsules (100-200 mg total) by mouth 3 (three) times daily as needed. (Patient taking differently: Take 100-200 mg by mouth 2 (two) times daily. Pain and headache)   Galcanezumab-gnlm (EMGALITY) 120 MG/ML SOAJ Inject 120 mg into the skin every 30 (thirty) days.   Insulin Human (INSULIN PUMP) SOLN Inject into the skin continuous. Novolog insulin   ketoconazole (NIZORAL) 2 % shampoo APPLY 1 APPLICATION TOPICALLY 2 (TWO) TIMES A WEEK.   lidocaine-prilocaine (EMLA) cream Apply 1 application topically every 2 (two) hours as needed. Please run with good Rx discount coupon   Melatonin 5 MG CHEW Chew 5 mg by mouth daily as needed (Sleep).   nitroGLYCERIN (NITROLINGUAL) 0.4 MG/SPRAY spray Place 1 spray under the tongue every 5 (five) minutes x 3 doses as needed for chest pain.   NOVOLOG 100 UNIT/ML injection Inject 40 Units into the skin daily.   ondansetron (ZOFRAN) 4 MG tablet Take 1 tablet (4 mg total) by mouth every 8 (eight) hours as needed for nausea or vomiting.   OVER THE COUNTER MEDICATION as directed. Magnesium L-Treonate   oxycodone (OXY-IR) 5 MG capsule Take 1 capsule (5 mg total) by mouth every 4 (four) hours as needed.   Pancrelipase,  Lip-Prot-Amyl, (CREON) 3000-9500 units CPEP Take 4 capsules by mouth 3 (three) times daily.   promethazine (PHENERGAN) 25 MG tablet Take 1 tablet (25 mg total) by mouth every 6 (six) hours as needed for  nausea or vomiting.   spironolactone (ALDACTONE) 25 MG tablet Take 50 mg by mouth daily.   torsemide ([name]) 20 MG tablet TAKE 1 TABLET ONCE DAILY INTHE MORNING. MAY TAKE AN   EXTRA 1/2 TABLET AS        NEEDED (Patient taking differently: Take 20 mg by mouth daily as needed (Water retention).)   valACYclovir (VALTREX) 500 MG tablet Take 500 mg by mouth daily as needed (Flair up).   [DISCONTINUED] Pancrelipase, Lip-Prot-Amyl, (CREON) 3000-9500 units CPEP Take 2 capsules by mouth 2 (two) times daily with a meal. Amylase 15000 units   No facility-administered encounter medications on file as of 07/03/2020.     REVIEW OF SYSTEMS  : All other systems reviewed and negative except where noted in the History of Present Illness.   PHYSICAL EXAM: BP (!) 142/64   Pulse 64   Ht 4\' 11"  (1.499 m)   Wt 97 lb (44 kg)   BMI 19.59 kg/m  General: Well developed white female in no acute distress Head: Normocephalic and atraumatic Eyes:  Sclerae anicteric, conjunctiva pink. Ears: Normal auditory acuity Lungs: Clear throughout to auscultation; no W/R/R. Heart: Regular rate and rhythm; no M/R/G. Abdomen: Soft, non-distended.  BS present.  Non-tender. Rectal:  Will be done at the time of colonoscopy. Musculoskeletal: Symmetrical with no gross deformities  Skin: No lesions on visible extremities Extremities: No edema  Neurological: Alert oriented x 4, grossly non-focal Psychological:  Alert and cooperative. Normal mood and affect  ASSESSMENT AND PLAN: *Chronic diarrhea: Has longstanding IBS.  Had a breath test but does not sound like a formal diagnosis of SIBO was made, but it appears that she was treated with Xifaxan on 1 or 2 occasions in the past and had some response to that previously.  She is  empirically on pancreatic enzymes for possible EPI, but they are really underdosed at only 12,000 units with meals and that was actually just increased to that dose recently.  Last colonoscopy was 5 years ago.  I think we will plan for colonoscopy with Dr. Lyndel Safe to rule out microscopic colitis.  If that is negative then could consider either SIBO testing or repeat empiric treatment with Xifaxan.  Could also try increasing Creon to appropriate dosing to see if that is more effective.  Could consider taking her off of the Creon long enough to perform fecal elastase stool study as well.  In the interim she can continue using Imodium or Lomotil as needed.  The risks, benefits, and alternatives to colonoscopy were discussed with the patient and she consents to proceed.   *IDDM:  Insulin will be adjusted prior to endoscopic procedure per protocol. Will resume normal dosing after procedure.  **60 minutes were spent with the patient, at least 50% of which was spent reviewing previous records, discussing differential diagnosis and care plan, etc.   CC:  Saguier, Percell Miller, PA-C

## 2020-07-03 NOTE — Telephone Encounter (Signed)
INSULIN PUMP    Brittney Pace 06-01-1955 [id number]  Procedure: 07/17/20  Anesthesia type:  MAC Procedure Date: 07/17/20 Provider: Dr. Lyndel Safe  Type of Clearance needed: Pharmacy - Insulin Pump  Insulin Pump Instructions needed    Please review request and advise by either responding to this message or by sending your response to the fax # provided below.  Thank you,  [facility]  Phone: [phone] Fax: [phone] ATTENTION: Aften Lipsey, LPN

## 2020-07-06 ENCOUNTER — Encounter: Payer: Self-pay | Admitting: Medical

## 2020-07-06 NOTE — Telephone Encounter (Signed)
Chart Review Routing History Since 07/08/2019 [facility] Full Routing History)  Recipients Sent On Sent By Routed Reports   Dr. Hermelinda Dellen   07/03/2020  3:40 PM Aleatha Borer, LPN Telephone on 0/98/1191 with Aleatha Borer, LPN      Cover Page Message : Please review request and advise

## 2020-07-07 ENCOUNTER — Inpatient Hospital Stay: Payer: No Typology Code available for payment source

## 2020-07-07 ENCOUNTER — Encounter: Payer: Self-pay | Admitting: Family

## 2020-07-07 ENCOUNTER — Inpatient Hospital Stay: Payer: No Typology Code available for payment source | Attending: Hematology & Oncology

## 2020-07-07 ENCOUNTER — Telehealth: Payer: Self-pay

## 2020-07-07 ENCOUNTER — Inpatient Hospital Stay (HOSPITAL_BASED_OUTPATIENT_CLINIC_OR_DEPARTMENT_OTHER): Payer: No Typology Code available for payment source | Admitting: Family

## 2020-07-07 VITALS — BP 155/69 | HR 67 | Temp 97.9°F | Resp 17 | Wt 95.0 lb

## 2020-07-07 DIAGNOSIS — D631 Anemia in chronic kidney disease: Secondary | ICD-10-CM | POA: Insufficient documentation

## 2020-07-07 DIAGNOSIS — D508 Other iron deficiency anemias: Secondary | ICD-10-CM

## 2020-07-07 DIAGNOSIS — N1831 Chronic kidney disease, stage 3a: Secondary | ICD-10-CM

## 2020-07-07 DIAGNOSIS — D649 Anemia, unspecified: Secondary | ICD-10-CM | POA: Insufficient documentation

## 2020-07-07 DIAGNOSIS — Z79899 Other long term (current) drug therapy: Secondary | ICD-10-CM | POA: Diagnosis not present

## 2020-07-07 DIAGNOSIS — N189 Chronic kidney disease, unspecified: Secondary | ICD-10-CM | POA: Diagnosis present

## 2020-07-07 LAB — CMP (CANCER CENTER ONLY)
ALT: 17 U/L (ref 0–44)
AST: 26 U/L (ref 15–41)
Albumin: 3.6 g/dL (ref 3.5–5.0)
Alkaline Phosphatase: 106 U/L (ref 38–126)
Anion gap: 7 (ref 5–15)
BUN: 32 mg/dL — ABNORMAL HIGH (ref 8–23)
CO2: 30 mmol/L (ref 22–32)
Calcium: 9.2 mg/dL (ref 8.9–10.3)
Chloride: 91 mmol/L — ABNORMAL LOW (ref 98–111)
Creatinine: 1.41 mg/dL — ABNORMAL HIGH (ref 0.44–1.00)
GFR, Estimated: 41 mL/min — ABNORMAL LOW (ref >60)
Glucose, Bld: 259 mg/dL — ABNORMAL HIGH (ref 70–99)
Potassium: 4.5 mmol/L (ref 3.5–5.1)
Sodium: 128 mmol/L — ABNORMAL LOW (ref 135–145)
Total Bilirubin: 0.4 mg/dL (ref 0.3–1.2)
Total Protein: 5.6 g/dL — ABNORMAL LOW (ref 6.5–8.1)

## 2020-07-07 LAB — CBC WITH DIFFERENTIAL (CANCER CENTER ONLY)
Abs Immature Granulocytes: 0.01 10*3/uL (ref 0.00–0.07)
Basophils Absolute: 0.1 10*3/uL (ref 0.0–0.1)
Basophils Relative: 1 %
Eosinophils Absolute: 0.3 10*3/uL (ref 0.0–0.5)
Eosinophils Relative: 6 %
HCT: 34.1 % — ABNORMAL LOW (ref 36.0–46.0)
Hemoglobin: 11.2 g/dL — ABNORMAL LOW (ref 12.0–15.0)
Immature Granulocytes: 0 %
Lymphocytes Relative: 26 %
Lymphs Abs: 1.3 10*3/uL (ref 0.7–4.0)
MCH: 29.8 pg (ref 26.0–34.0)
MCHC: 32.8 g/dL (ref 30.0–36.0)
MCV: 90.7 fL (ref 80.0–100.0)
Monocytes Absolute: 0.5 10*3/uL (ref 0.1–1.0)
Monocytes Relative: 9 %
Neutro Abs: 2.9 10*3/uL (ref 1.7–7.7)
Neutrophils Relative %: 58 %
Platelet Count: 304 10*3/uL (ref 150–400)
RBC: 3.76 MIL/uL — ABNORMAL LOW (ref 3.87–5.11)
RDW: 15.5 % (ref 11.5–15.5)
WBC Count: 5 10*3/uL (ref 4.0–10.5)
nRBC: 0 % (ref 0.0–0.2)

## 2020-07-07 LAB — RETICULOCYTES
Immature Retic Fract: 2.8 % (ref 2.3–15.9)
RBC.: 3.79 MIL/uL — ABNORMAL LOW (ref 3.87–5.11)
Retic Count, Absolute: 16.3 10*3/uL — ABNORMAL LOW (ref 19.0–186.0)
Retic Ct Pct: 0.4 % (ref 0.4–3.1)

## 2020-07-07 NOTE — Progress Notes (Signed)
Hematology and Oncology Follow Up Visit  Brittney Pace [id number] 1955-01-30 65 y.o. 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 is here today for follow-up. She is still feeling fatigued since receiving blood several weeks ago.  She has not noted any blood loss. No bruising or petechiae.  She is scheduled to have a colonoscopy next week with Dr. Lyndel Safe for further eval.  Her chloride is 91. She is making an effort to stay well hydrated and wants to try to drink fluids instead of getting IV fluids at this time.  She has been working hard to regulate her blood sugars. She has a pump and this can sometimes be hard to manage.  The candida rash has effected her face and vagina and she plans to discuss a referral to ID at [facility] with her PCP. She does not want to go back to her provider at [facility].  No fever, chills, n/v, cough, rash, dizziness, SOB, chest pain or changes in bladder habits or IBS.  No swelling in her extremities at this time.  The neuropathy in her hands and feet.  No falls or syncope to report.   ECOG Performance Status: 1 - Symptomatic but completely ambulatory  Medications:  Allergies as of 07/07/2020       Reactions   Morphine Anaphylaxis, Shortness Of Breath   Morphine And Related Shortness Of Breath   Penicillins Hives   Tolerated ANCEF on 04/30/20 Has patient had a PCN reaction causing immediate rash, facial/tongue/throat swelling, SOB or lightheadedness with hypotension: Yes Has patient had a PCN reaction causing severe rash involving mucus membranes or skin necrosis: No Has patient had a PCN reaction that required hospitalization No Has patient had a PCN reaction occurring within the last 10 years: No If all of the above answers are "NO", then may proceed with  Cephalosporin use.   Tramadol Anaphylaxis   Tramadol-acetaminophen Other (See Comments)   Small vessel heart attack Other reaction(s): Cardiovascular Arrest (ALLERGY/intolerance), Other (See Comments)   Acetaminophen Other (See Comments)   Alters insulin pump readings   Amitriptyline Other (See Comments)   Severe headache/ out of body feeling   Codeine Other (See Comments)   Severe headaches/ out of body feeling   Ibuprofen Other (See Comments)   Messes up CGM reading on glucose monitor   Losartan Cough   Propoxyphene Other (See Comments)   Severe headaches / out of body feeling   Shellfish Allergy Diarrhea, Nausea And Vomiting   Statins Other (See Comments)   Muscle pains Other reaction(s): Other   Sulfamethoxazole Other (See Comments)   Mouth ulcers   Trichophyton Itching, Other (See Comments)   (fungus) sinusitis   Krill Oil Diarrhea, Itching, Nausea And Vomiting        Medication List        Accurate as of July 07, 2020  3:14 PM. If you have any questions, ask your nurse or doctor.          AMBULATORY NON FORMULARY MEDICATION Incontinence pads per patient preference, #unlimited, refill x99 Fax to [phone]   amLODipine 5 MG tablet Commonly known as: NORVASC 1 tab po q day   Armodafinil 250 MG tablet Take 1 tablet (250 mg total) by mouth daily. OR can take 0.5 tablet (125 mg)  po bid   Armour Thyroid 60 MG tablet Generic drug: thyroid Take 1 tablet (60 mg total) by mouth daily before breakfast.   aspirin EC 325 MG tablet Take 1 tablet (325 mg total) by mouth daily. What changed: when to take this   carvedilol 12.5 MG tablet Commonly known as: COREG TAKE 1 TABLET BY MOUTH TWICE DAILY WITH MEALS   Clenpiq 10-3.5-12 MG-GM -GM/160ML Soln Generic drug: Sod Picosulfate-Mag Ox-Cit Acd Take 1 kit by mouth as directed.   Creon 3000-9500 units Cpep Generic drug: Pancrelipase (Lip-Prot-Amyl) Take 4 capsules by mouth 3 (three) times daily.    cyclobenzaprine 10 MG tablet Commonly known as: FLEXERIL Take 0.5-1 tablets (5-10 mg total) by mouth 3 (three) times daily as needed for muscle spasms. Caution: can cause drowsiness   Dexcom G6 Sensor Misc USE TO CONTINUOUSLY MONITOR BLOOD SUGARS. CHANGE EVERY 10 DAYS   diclofenac Sodium 1 % Gel Commonly known as: VOLTAREN Apply 2 g topically daily as needed (Pain).   diphenhydrAMINE 25 MG tablet Commonly known as: BENADRYL Take 25 mg by mouth at bedtime.   diphenoxylate-atropine 2.5-0.025 MG tablet Commonly known as: LOMOTIL TAKE 1 TABLET BY MOUTH 4 TIMES DAILY AS NEEDED FOR  DIARRHEA  OR  LOOSE  STOOLS What changed: See the new instructions.   Emgality 120 MG/ML Soaj Generic drug: Galcanezumab-gnlm Inject 120 mg into the skin every 30 (thirty) days.   esomeprazole 20 MG capsule Commonly known as: NEXIUM Take 20 mg by mouth every morning.   fluocinonide 0.05 % external solution Commonly known as: LIDEX Apply 1 application topically See admin instructions. Once every three days   gabapentin 100 MG capsule Commonly known as: NEURONTIN Take 1-2 capsules (100-200 mg total) by mouth 3 (three) times daily as needed. What changed:  when to take this additional instructions   insulin pump Soln Inject into the skin continuous. Novolog insulin   ketoconazole 2 % shampoo Commonly known as: NIZORAL APPLY 1 APPLICATION TOPICALLY 2 (TWO) TIMES A WEEK.   lidocaine-prilocaine cream Commonly known as: EMLA Apply 1 application topically every 2 (two) hours as needed. Please run with good Rx discount coupon   Melatonin 5 MG Chew Chew 5 mg by mouth daily as needed (Sleep).   nitroGLYCERIN 0.4 MG/SPRAY spray Commonly known as: NITROLINGUAL Place 1 spray under the tongue every 5 (five) minutes x 3 doses as needed for chest pain.   NovoLOG 100 UNIT/ML injection Generic drug: insulin aspart Inject 40 Units into the skin daily.   ondansetron 4 MG tablet Commonly known as:  Zofran Take 1 tablet (4 mg total) by mouth every 8 (eight) hours as needed for nausea or vomiting.   OVER THE COUNTER MEDICATION as directed. Magnesium L-Treonate   oxycodone 5 MG capsule Commonly known as: OXY-IR Take 1 capsule (5 mg total) by mouth every 4 (four) hours as needed.   promethazine 25 MG tablet Commonly known as: PHENERGAN Take 1 tablet (25 mg total) by mouth every 6 (six) hours as needed for nausea or vomiting.   spironolactone 25 MG tablet Commonly known as: ALDACTONE Take 50 mg by mouth daily.   torsemide 20 MG tablet Commonly known as: [name] TAKE 1 TABLET ONCE DAILY INTHE MORNING. MAY TAKE AN   EXTRA 1/2 TABLET AS        NEEDED What changed: See the new instructions.   valACYclovir 500 MG tablet Commonly known as: VALTREX Take 500 mg by mouth daily as needed (Flair up).  Allergies:  Allergies  Allergen Reactions   Morphine Anaphylaxis and Shortness Of Breath   Morphine And Related Shortness Of Breath   Penicillins Hives    Tolerated ANCEF on 04/30/20 Has patient had a PCN reaction causing immediate rash, facial/tongue/throat swelling, SOB or lightheadedness with hypotension: Yes Has patient had a PCN reaction causing severe rash involving mucus membranes or skin necrosis: No Has patient had a PCN reaction that required hospitalization No Has patient had a PCN reaction occurring within the last 10 years: No If all of the above answers are "NO", then may proceed with Cephalosporin use.   Tramadol Anaphylaxis   Tramadol-Acetaminophen Other (See Comments)    Small vessel heart attack Other reaction(s): Cardiovascular Arrest (ALLERGY/intolerance), Other (See Comments)    Acetaminophen Other (See Comments)    Alters insulin pump readings    Amitriptyline Other (See Comments)    Severe headache/ out of body feeling   Codeine Other (See Comments)    Severe headaches/ out of body feeling    Ibuprofen Other (See Comments)    Messes up CGM  reading on glucose monitor     Losartan Cough   Propoxyphene Other (See Comments)    Severe headaches / out of body feeling    Shellfish Allergy Diarrhea and Nausea And Vomiting   Statins Other (See Comments)    Muscle pains Other reaction(s): Other   Sulfamethoxazole Other (See Comments)    Mouth ulcers    Trichophyton Itching and Other (See Comments)    (fungus) sinusitis    Krill Oil Diarrhea, Itching and Nausea And Vomiting    Past Medical History, Surgical history, Social history, and Family History were reviewed and updated.  Review of Systems: All other 10 point review of systems is negative.   Physical Exam:  vitals were not taken for this visit.   Wt Readings from Last 3 Encounters:  07/03/20 97 lb (44 kg)  06/19/20 98 lb 6.4 oz (44.6 kg)  05/21/20 103 lb (46.7 kg)    Ocular: Sclerae unicteric, pupils equal, round and reactive to light Ear-nose-throat: Oropharynx clear, dentition fair Lymphatic: No cervical or supraclavicular adenopathy Lungs no rales or rhonchi, good excursion bilaterally Heart regular rate and rhythm, no murmur appreciated Abd soft, nontender, positive bowel sounds MSK no focal spinal tenderness, no joint edema Neuro: non-focal, well-oriented, appropriate affect Breasts: Deferred   Lab Results  Component Value Date   WBC 5.0 07/07/2020   HGB 11.2 (L) 07/07/2020   HCT 34.1 (L) 07/07/2020   MCV 90.7 07/07/2020   PLT 304 07/07/2020   Lab Results  Component Value Date   FERRITIN 125 06/02/2020   IRON 36 (L) 06/02/2020   TIBC 284 06/02/2020   UIBC 247 06/02/2020   IRONPCTSAT 13 (L) 06/02/2020   Lab Results  Component Value Date   RETICCTPCT 2.2 06/02/2020   RBC 3.76 (L) 07/07/2020   No results found for: KPAFRELGTCHN, LAMBDASER, KAPLAMBRATIO No results found for: IGGSERUM, IGA, IGMSERUM No results found for: TOTALPROTELP, ALBUMINELP, A1GS, A2GS, BETS, BETA2SER, GAMS, MSPIKE, SPEI   Chemistry      Component Value Date/Time    NA 128 (L) 07/07/2020 1341   NA 138 07/17/2017 1128   K 4.5 07/07/2020 1341   CL 91 (L) 07/07/2020 1341   CO2 30 07/07/2020 1341   BUN 32 (H) 07/07/2020 1341   BUN 23 07/17/2017 1128   CREATININE 1.41 (H) 07/07/2020 1341   CREATININE 0.99 07/20/2018 0821      Component Value Date/Time  CALCIUM 9.2 07/07/2020 1341   ALKPHOS 106 07/07/2020 1341   AST 26 07/07/2020 1341   ALT 17 07/07/2020 1341   BILITOT 0.4 07/07/2020 1341       Impression and Plan: Ms. Zingale is a very pleasant 65 yo female with multifactorial anemia. No ESA needed today, Hgb 11.2.  Iron studies are pending.  She will increase her oral hydration and contact us if she feels she needs IV fluids.  Lab and injection every 3 weeks and follow-up in 12 weeks.  She will contact our office with any questions or concerns.   Laverna Peace, NP 6/14/20223:14 PM

## 2020-07-07 NOTE — Telephone Encounter (Signed)
Appts made per los 07/07/20, pt to view them mychart

## 2020-07-08 ENCOUNTER — Telehealth: Payer: Self-pay | Admitting: Medical

## 2020-07-08 DIAGNOSIS — L089 Local infection of the skin and subcutaneous tissue, unspecified: Secondary | ICD-10-CM

## 2020-07-08 LAB — IRON AND TIBC
Iron: 126 ug/dL (ref 41–142)
Saturation Ratios: 44 % (ref 21–57)
TIBC: 285 ug/dL (ref 236–444)
UIBC: 159 ug/dL (ref 120–384)

## 2020-07-08 LAB — FERRITIN: Ferritin: 160 ng/mL (ref 11–307)

## 2020-07-08 NOTE — Telephone Encounter (Signed)
Called pt and gave her the #

## 2020-07-08 NOTE — Telephone Encounter (Signed)
Opened for review. 

## 2020-07-08 NOTE — Telephone Encounter (Signed)
Pt needs Dermatologist office phone number. I think she missed appointment or phone call?

## 2020-07-08 NOTE — Telephone Encounter (Signed)
Future wound culture placed.

## 2020-07-09 NOTE — Telephone Encounter (Signed)
Called Dr. Melida Quitter office to follow up about our request for insulin pump instructions. Spoke with Caryl Pina. States she will request to have a nurse return my call.

## 2020-07-09 NOTE — Telephone Encounter (Signed)
Patient already has a follow up appointment for 6/20

## 2020-07-10 NOTE — Telephone Encounter (Signed)
Called Dr. Melida Quitter office to f/u on status on insulin pump instructions. Confirmed nurse DID forward our request to provider and is awaiting provider's response. States she will likely fax by the end of today. Will await fax.

## 2020-07-13 ENCOUNTER — Ambulatory Visit (INDEPENDENT_AMBULATORY_CARE_PROVIDER_SITE_OTHER): Payer: No Typology Code available for payment source | Admitting: Medical

## 2020-07-13 ENCOUNTER — Encounter: Payer: Self-pay | Admitting: Family

## 2020-07-13 ENCOUNTER — Other Ambulatory Visit: Payer: Self-pay

## 2020-07-13 VITALS — BP 130/78 | HR 62 | Resp 18 | Ht 59.0 in | Wt 95.8 lb

## 2020-07-13 DIAGNOSIS — K3184 Gastroparesis: Secondary | ICD-10-CM

## 2020-07-13 DIAGNOSIS — E1022 Type 1 diabetes mellitus with diabetic chronic kidney disease: Secondary | ICD-10-CM

## 2020-07-13 DIAGNOSIS — Z8673 Personal history of transient ischemic attack (TIA), and cerebral infarction without residual deficits: Secondary | ICD-10-CM

## 2020-07-13 DIAGNOSIS — E1059 Type 1 diabetes mellitus with other circulatory complications: Secondary | ICD-10-CM | POA: Diagnosis not present

## 2020-07-13 DIAGNOSIS — I2510 Atherosclerotic heart disease of native coronary artery without angina pectoris: Secondary | ICD-10-CM

## 2020-07-13 DIAGNOSIS — I10 Essential (primary) hypertension: Secondary | ICD-10-CM

## 2020-07-13 DIAGNOSIS — Z8619 Personal history of other infectious and parasitic diseases: Secondary | ICD-10-CM | POA: Diagnosis not present

## 2020-07-13 DIAGNOSIS — R21 Rash and other nonspecific skin eruption: Secondary | ICD-10-CM

## 2020-07-13 DIAGNOSIS — N183 Chronic kidney disease, stage 3 unspecified: Secondary | ICD-10-CM

## 2020-07-13 DIAGNOSIS — L509 Urticaria, unspecified: Secondary | ICD-10-CM

## 2020-07-13 DIAGNOSIS — K219 Gastro-esophageal reflux disease without esophagitis: Secondary | ICD-10-CM

## 2020-07-13 DIAGNOSIS — E871 Hypo-osmolality and hyponatremia: Secondary | ICD-10-CM

## 2020-07-13 MED ORDER — ARMODAFINIL 250 MG PO TABS: ORAL_TABLET | ORAL | 1 refills | Status: DC

## 2020-07-13 NOTE — Telephone Encounter (Signed)
  Chart Review Routing History Since 07/15/2019 [facility] Full Routing History)  Recipients Sent On Sent By Routed Reports   Tempie Hoist, MD   07/13/2020  8:19 AM Aleatha Borer, LPN Telephone on 2/84/1324 with Aleatha Borer, LPN      Cover Page Message : Georgetta Haber!!

## 2020-07-13 NOTE — Patient Instructions (Addendum)
For diabetes continue to see current endocrinologist.  Htn adequately controlled. Continue current diuretics.   Low sodium hx. Diuretics may play a role. Your report seeing nephrologist and getting cmp that day. Let me know results so can review sodium level. Our fax number is [phone].  Hx of lyme disease. You request referral to ID. Will place referral.  Upcoming colonoscopy.  Hx of stroke and depression. Post stroke you describe fatigue, "foggy headed "and old notes mention from prior pcp mentioned possible sleep disorder. Serotonin syndrome reported in the past. So will avoid ssri. Will refill your nuvigil former pcp prescribed.

## 2020-07-13 NOTE — Progress Notes (Signed)
Subjective:    Patient ID: Brittney Pace, female    DOB: 04-Nov-1955, 65 y.o.   MRN: [id number]  HPI  Pt in for follow up.  Zaray Gatchel is a 65 y.o. 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    She is scheduled to have labs done on July 28, 2020.  Pt feels depressed and she associated depression with her poor health. Some notes on review indicate. Pt states in past did not do well with wellbutrin or zoloft. State had serotonin syndrome in the past.  Pt attributes poor health prior tick bites. Pt states she took antibiotic for 4 months. Pt wants to be referred  ID MD.  Pt has seen Dr. Sheppard Coil. DO  Pt sees her in [place]. Pt was given nuvigil in the past. Dr. Dannielle Karvonen recommeded nuvigil per pt. Dr. Sheppard Coil decided to prescribe. Pt has 4 tabs left. Pt relays to me she takes it for brain fog. I can't see Dr Dannielle Karvonen not in epic. Pt indicates she needs nuvigil to function.   Pt is going to see Dr. Lyndel Safe for colonoscopy. Pt has concerns for fungal overgrowth.   Pt last a1c was 8.1. See endocrinologist.   Pt wants to see Gerentologist.  Pt has htn. She has not taken amlodipine. She is on spirinolactone and demadex.  Pt will see her kidney MD in 2 days. Pt will get cmp with nephrologist.  Pt lives in [place].    Review of Systems  Constitutional:  Negative for chills,  fatigue and fever.  Respiratory:  Negative for cough, chest tightness, shortness of breath and wheezing.   Cardiovascular:  Negative for chest pain and palpitations.  Gastrointestinal:  Negative for abdominal pain, diarrhea and vomiting.  Genitourinary:  Negative for difficulty urinating, dysuria, enuresis and frequency.  Musculoskeletal:  Negative for back pain, gait problem and joint swelling.    Past Medical History:  Diagnosis Date   Anemia    Arthritis    Babesiasis    secondary due to lyme disease   CHF (congestive heart failure) ([place])    Chronic kidney disease    stage 3   Coronary artery disease    Depression    Diabetes mellitus without complication (HCC)    Type 1   Diabetic retinopathy ([place])    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   Family history of adverse reaction to anesthesia    mother: " while she was under she stopped breathing."   Fibromyalgia    Gastroparesis    GERD (gastroesophageal reflux disease)    Headache    migraines   Hypothyroidism    IBS (irritable bowel syndrome)    Idiopathic edema    Iron deficiency anemia 09/04/2019   Lyme disease    Mitral valve prolapse    Myocardial infarction ([place])    1 major in 1999 and 2 minor " small vessel disease."   Osteoporosis    Peripheral neuropathy    Peripheral vascular disease (HCC)    Sinus disorder    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   Stroke ([place])  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" " the second stroke was a lacunar      Social History   Socioeconomic History   Marital status: Married    Spouse name: Not on file   Number of children: 0   Years of education: college   Highest education level: Not on file  Occupational History   Occupation: Retired  Tobacco Use   Smoking status: Former    Pack years: 0.00    Types: Cigarettes   Smokeless tobacco: Never   Tobacco comments:     " Quit smoking cigarettes in 20's "  Vaping Use   Vaping Use: Never used  Substance and Sexual  Activity   Alcohol use: Yes    Comment: occasional beer or wine   Drug use: No   Sexual activity: Not on file  Other Topics Concern   Not on file  Social History Narrative   Drinks 1-2  cups caffeine drinks a day    Right handed   Lives at home with husband and dog   Social Determinants of Health   Financial Resource Strain: Not on file  Food Insecurity: Not on file  Transportation Needs: Not on file  Physical Activity: Not on file  Stress: Not on file  Social Connections: Not on file  Intimate Partner Violence: Not on file    Past Surgical History:  Procedure Laterality Date   ABDOMINAL HYSTERECTOMY     APPENDECTOMY     BREAST SURGERY     B/L biopsy and lumpectomy    CARDIAC CATHETERIZATION     CARPAL TUNNEL RELEASE     CATARACT EXTRACTION W/ INTRAOCULAR LENS IMPLANT     right eye   COLONOSCOPY W/ BIOPSIES AND POLYPECTOMY     CORONARY ARTERY BYPASS GRAFT     coronary artery stents     at [place] and [place]   ECTOPIC PREGNANCY SURGERY     NASAL SEPTUM SURGERY     OPEN REDUCTION INTERNAL FIXATION (ORIF) DISTAL RADIAL FRACTURE Right 05/06/2015   Procedure: OPEN REDUCTION INTERNAL FIXATION (ORIF) RIGHT DISTAL RADIAL FRACTURE AND REPAIRS AS NEEDED;  Surgeon: Iran Planas, MD;  Location: [facility];  Service: Orthopedics;  Laterality: Right;   ORIF HUMERUS FRACTURE Right 04/30/2020   Procedure: OPEN REDUCTION INTERNAL FIXATION (ORIF) PROXIMAL HUMERUS FRACTURE;  Surgeon: Justice Britain, MD;  Location: [facility];  Service: Orthopedics;  Laterality: Right;  122min   ORIF WRIST FRACTURE Left 11/05/2014   ORIF WRIST FRACTURE Left 11/05/2014   Procedure: OPEN REDUCTION INTERNAL FIXATION (ORIF) LEFT WRIST FRACTURE AND REPAIR AS INDICATED;  Surgeon: Iran Planas, MD;  Location: [facility];  Service: Orthopedics;  Laterality: Left;   TRIGGER FINGER RELEASE      Family History  Problem Relation Age of Onset   Breast cancer Mother    Migraines Mother    Heart disease Father    Hypertension Father     Breast cancer Sister     Allergies  Allergen Reactions   Morphine Anaphylaxis and Shortness Of Breath   Morphine And Related Shortness Of Breath   Penicillins Hives    Tolerated ANCEF on 04/30/20 Has patient had a PCN reaction causing immediate rash, facial/tongue/throat swelling, SOB or lightheadedness with hypotension: Yes Has patient had a PCN reaction causing severe rash involving mucus membranes or skin necrosis: No Has patient had a PCN reaction that required hospitalization No Has patient had a PCN reaction occurring within the last 10 years: No If all of the above  answers are "NO", then may proceed with Cephalosporin use.   Tramadol Anaphylaxis   Tramadol-Acetaminophen Other (See Comments)    Small vessel heart attack Other reaction(s): Cardiovascular Arrest (ALLERGY/intolerance), Other (See Comments)    Acetaminophen Other (See Comments)    Alters insulin pump readings    Amitriptyline Other (See Comments)    Severe headache/ out of body feeling   Codeine Other (See Comments)    Severe headaches/ out of body feeling    Ibuprofen Other (See Comments)    Messes up CGM reading on glucose monitor     Losartan Cough   Propoxyphene Other (See Comments)    Severe headaches / out of body feeling    Shellfish Allergy Diarrhea and Nausea And Vomiting   Statins Other (See Comments)    Muscle pains Other reaction(s): Other   Sulfamethoxazole Other (See Comments)    Mouth ulcers    Trichophyton Itching and Other (See Comments)    (fungus) sinusitis    Krill Oil Diarrhea, Itching and Nausea And Vomiting    Current Outpatient Medications on File Prior to Visit  Medication Sig Dispense Refill   AMBULATORY NON FORMULARY MEDICATION Incontinence pads per patient preference, #unlimited, refill x99 Fax to [phone] 1 Units prn   amLODipine (NORVASC) 5 MG tablet 1 tab po q day 30 tablet 0   ARMOUR THYROID 60 MG tablet Take 1 tablet (60 mg total) by mouth daily before  breakfast. 90 tablet PRN   aspirin EC 325 MG tablet Take 1 tablet (325 mg total) by mouth daily. (Patient taking differently: Take 325 mg by mouth at bedtime.) 30 tablet 0   carvedilol (COREG) 12.5 MG tablet TAKE 1 TABLET BY MOUTH TWICE DAILY WITH MEALS 60 tablet 0   Continuous Blood Gluc Sensor (DEXCOM G6 SENSOR) MISC USE TO CONTINUOUSLY MONITOR BLOOD SUGARS. CHANGE EVERY 10 DAYS     cyclobenzaprine (FLEXERIL) 10 MG tablet Take 0.5-1 tablets (5-10 mg total) by mouth 3 (three) times daily as needed for muscle spasms. Caution: can cause drowsiness 40 tablet 1   diclofenac Sodium (VOLTAREN) 1 % GEL Apply 2 g topically daily as needed (Pain).     diphenhydrAMINE (BENADRYL) 25 MG tablet Take 25 mg by mouth at bedtime.     diphenoxylate-atropine (LOMOTIL) 2.5-0.025 MG tablet TAKE 1 TABLET BY MOUTH 4 TIMES DAILY AS NEEDED FOR  DIARRHEA  OR  LOOSE  STOOLS (Patient taking differently: Take 1 tablet by mouth daily as needed for diarrhea or loose stools.) 90 tablet 0   esomeprazole (NEXIUM) 20 MG capsule Take 20 mg by mouth every morning.      fluocinonide (LIDEX) 0.05 % external solution Apply 1 application topically See admin instructions. Once every three days     gabapentin (NEURONTIN) 100 MG capsule Take 1-2 capsules (100-200 mg total) by mouth 3 (three) times daily as needed. (Patient taking differently: Take 100-200 mg by mouth 2 (two) times daily. Pain and headache) 180 capsule 5   Galcanezumab-gnlm (EMGALITY) 120 MG/ML SOAJ Inject 120 mg into the skin every 30 (thirty) days. 1 mL 5   Insulin Human (INSULIN PUMP) SOLN Inject into the skin continuous. Novolog insulin     ketoconazole (NIZORAL) 2 % shampoo APPLY 1 APPLICATION TOPICALLY 2 (TWO) TIMES A WEEK. 240 mL 1   lidocaine-prilocaine (EMLA) cream Apply 1 application topically every 2 (two) hours as needed. Please run with good Rx discount coupon 30 g 0   Melatonin 5 MG CHEW Chew 5 mg by mouth daily  as needed (Sleep).     nitroGLYCERIN (NITROLINGUAL)  0.4 MG/SPRAY spray Place 1 spray under the tongue every 5 (five) minutes x 3 doses as needed for chest pain. 12 g 1   NOVOLOG 100 UNIT/ML injection Inject 40 Units into the skin daily.     ondansetron (ZOFRAN) 4 MG tablet Take 1 tablet (4 mg total) by mouth every 8 (eight) hours as needed for nausea or vomiting. 10 tablet 0   OVER THE COUNTER MEDICATION as directed. Magnesium L-Treonate     oxycodone (OXY-IR) 5 MG capsule Take 1 capsule (5 mg total) by mouth every 4 (four) hours as needed. 30 capsule 0   Pancrelipase, Lip-Prot-Amyl, (CREON) 3000-9500 units CPEP Take 4 capsules by mouth 3 (three) times daily.     promethazine (PHENERGAN) 25 MG tablet Take 1 tablet (25 mg total) by mouth every 6 (six) hours as needed for nausea or vomiting. 10 tablet 0   Sod Picosulfate-Mag Ox-Cit Acd (CLENPIQ) 10-3.5-12 MG-GM -GM/160ML SOLN Take 1 kit by mouth as directed. 320 mL 0   spironolactone (ALDACTONE) 25 MG tablet Take 50 mg by mouth daily.     torsemide ([name]) 20 MG tablet TAKE 1 TABLET ONCE DAILY INTHE MORNING. MAY TAKE AN   EXTRA 1/2 TABLET AS        NEEDED (Patient taking differently: Take 20 mg by mouth daily as needed (Water retention).) 135 tablet 0   valACYclovir (VALTREX) 500 MG tablet Take 500 mg by mouth daily as needed (Flair up).     No current facility-administered medications on file prior to visit.    BP 130/78   Pulse 62   Resp 18   Ht $R'4\' 11"'QZ$  (1.499 m)   Wt 95 lb 12.8 oz (43.5 kg)   SpO2 95%   BMI 19.35 kg/m     Objective:   Physical Exam  General Mental Status- Alert. General Appearance- Not in acute distress.   Skin General: Color- Normal Color. Moisture- Normal Moisture.  Neck Carotid Arteries- Normal color. Moisture- Normal Moisture. No carotid bruits. No JVD.  Chest and Lung Exam Auscultation: Breath Sounds:-Normal.  Cardiovascular Auscultation:Rythm- Regular. Murmurs & Other Heart Sounds:Auscultation of the heart reveals- No  Murmurs.  Abdomen Inspection:-Inspeection Normal. Palpation/Percussion:Note:No mass. Palpation and Percussion of the abdomen reveal- Non Tender, Non Distended + BS, no rebound or guarding.   Neurologic Cranial Nerve exam:- CN III-XII intact(No nystagmus), symmetric smile. Strength:- 5/5 equal and symmetric strength both upper and lower extremities.       Assessment & Plan:  For diabetes continue to see current endocrinologist.  Htn adequately controlled. Continue current diuretics.   Low sodium hx. Diuretics may play a role. Your report seeing nephrologist and getting cmp that day. Let me know results so can review sodium level. Our fax number is [phone].  Hx of lyme disease. You request referral to ID. Will place referral.  Upcoming colonoscopy.  Hx of stroke and depression. Post stroke you describe fatigue, "foggy headed "and old notes mention from prior pcp mentioned possible sleep disorder. Serotonin syndrome reported in the past. So will avoid ssri. Will refill your nuvigil former pcp prescribed.  Follow up 1 month or as needed.   Pt wants to see Gerentologist. Will try to give her information so she can see them/transfer care.  Mackie Pai, PA-C   Time spent with patient today was  50 minutes which consisted of chart revdiew, discussing diagnoses, work up treatment and documentation.

## 2020-07-14 ENCOUNTER — Other Ambulatory Visit: Payer: No Typology Code available for payment source

## 2020-07-14 ENCOUNTER — Telehealth: Payer: Self-pay

## 2020-07-14 NOTE — Telephone Encounter (Signed)
Called again today and spoke with Caryl Pina to follow up on the status of this request. Confirmed fax # of [phone]. Advised we have faxed successfully numerous times but still have not received a response. So we do not delay pt care, asked that they expedite request ASAP. States she is sending a message to [facility] and should receive a response later today. Will continue efforts to follow up

## 2020-07-14 NOTE — Telephone Encounter (Signed)
You want her to wait until June or get her rescheduled to come into our office sometime this week

## 2020-07-14 NOTE — Telephone Encounter (Signed)
Pt cancelled lab appointment for today with the commentary:       Labs will be done at [facility] office at [facility] main on June 22. Nephrology appointment. I will need orders sent to them for Anything more than a BMP .

## 2020-07-15 NOTE — Telephone Encounter (Signed)
Telephone Encounter - Sharol Given Cox, RN - 07/14/2020 10:49 AM EDT Formatting of this note might be different from the original. Instructions sent in letter form to [place] by fax to number listed on request.  Electronically signed by: Quay Burow, RN 07/14/20 1050  Electronically signed by Quay Burow, RN at 07/14/2020 10:50 AM EDT  Back to top of Miscellaneous Notes Telephone Encounter - Tempie Hoist, MD - 07/14/2020 10:40 AM EDT Formatting of this note might be different from the original. I did Message is the same Leave insulin pump in place and continue her basal rate settings NO Changes  Electronically signed by: Tempie Hoist, MD 07/14/20 1041

## 2020-07-15 NOTE — Telephone Encounter (Signed)
Called pt and informed her about the instructions provided below re: insulin pump. Verbalized acceptance and understanding.

## 2020-07-17 ENCOUNTER — Encounter: Payer: Self-pay | Admitting: Medical

## 2020-07-17 ENCOUNTER — Telehealth: Payer: Self-pay | Admitting: Gastroenterology

## 2020-07-17 ENCOUNTER — Encounter: Payer: No Typology Code available for payment source | Admitting: Gastroenterology

## 2020-07-17 NOTE — Telephone Encounter (Signed)
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  (She will be seeing  Infectious Disease specialist the day before the Colonoscopy.)   Also would  like to know what to do if her blood sugar gets to low at home before procedure.   Patient has been running a fever because her candida overgrowth but it doesn't show as fever because her resting temperature 96.7  however it has been progressing.   Please call patient to go over her concerns

## 2020-07-17 NOTE — Telephone Encounter (Signed)
Please advise if you are familiar with pt request below re: Dr. Harvie Heck. With regards to her concerns about her CBG's and candidiasis over-growth, would you prefer she speak with her endocrinologist and ID?

## 2020-07-20 NOTE — Telephone Encounter (Signed)
Brittney Pace - I realize you are out of the office today. Please let me know upon your return so that I can return this pt call. At this time, I am unable to answer her questions.

## 2020-07-20 NOTE — Telephone Encounter (Signed)
Patient called to follow up on previous message would like to know prior to her scheduled Colonoscopy 07/23/20.

## 2020-07-21 ENCOUNTER — Telehealth: Payer: Self-pay | Admitting: Gastroenterology

## 2020-07-21 ENCOUNTER — Encounter: Payer: Self-pay | Admitting: Family

## 2020-07-21 NOTE — Telephone Encounter (Signed)
Hi Dr. Lyndel Safe, this patient just called to cancel procedure that was scheduled on 07/23/20 because she has a systemic candida infection and feels very sick. Patient will call back to reschedule when she feels better. Thank you.

## 2020-07-21 NOTE — Telephone Encounter (Signed)
Thanks for letting me know RG 

## 2020-07-21 NOTE — Telephone Encounter (Signed)
My chart message sent to pt. Appears pt has sent an additional My Chart message to Dr. Lyndel Safe. That message was forwarded to Dr. Lyndel Safe with included responses from Alonza Bogus, [place] as provided below. Dr. Lyndel Safe has been asked if he had anything further to add to [name]'s responses below. Will await his response and forward IF he has anything further to add.

## 2020-07-22 ENCOUNTER — Encounter: Payer: Self-pay | Admitting: Internal Medicine

## 2020-07-22 ENCOUNTER — Other Ambulatory Visit: Payer: Self-pay

## 2020-07-22 ENCOUNTER — Ambulatory Visit (INDEPENDENT_AMBULATORY_CARE_PROVIDER_SITE_OTHER): Payer: No Typology Code available for payment source | Admitting: Internal Medicine

## 2020-07-22 VITALS — BP 144/70 | HR 58 | Temp 98.2°F | Wt 94.0 lb

## 2020-07-22 DIAGNOSIS — B379 Candidiasis, unspecified: Secondary | ICD-10-CM

## 2020-07-22 DIAGNOSIS — B6000 Babesiosis, unspecified: Secondary | ICD-10-CM | POA: Diagnosis not present

## 2020-07-22 DIAGNOSIS — A6920 Lyme disease, unspecified: Secondary | ICD-10-CM | POA: Diagnosis not present

## 2020-07-22 NOTE — Progress Notes (Signed)
[facility] for Infectious Disease  Reason for Consult: reported history of chronic lyme disease, babesiosis, and candida overgrowth  Referring Provider: Mackie Pai, PA   HPI:    Brittney Pace is a 65 y.o. female with PMHx as below who presents to the clinic for further evaluation of chronic lyme disease, babesiosis, and candida overgrowth.  She was referred recently by her PCP at her request due to history of reported Lyme disease.  She has previously seen Dr Manuela Neptune at [facility] for the same issue in September and October 2019.  I have reviewed those notes and the extensive lab work up done at that time which was notable for no prior evidence of Lyme or Babesia exposure.  She was told at that time she never had either disease.  She has also been treated by "Lyme literate" physician with various modalities including IVIG.  Currently not receiving any specific treatments.  She also has ongoing brain fog and fatigue.  She also reports a history of "Candida overgrowth" that was present prior to diagnosis of Lyme, but was further exacerbated by doxycycline and has been out of control.  She has previously received antifungal therapy from her Lyme physician.  She feels that she has yeast growing everywhere again and has been using topical antifungal on her legs and face.  When she applies the antifungal cream she can feel the "dying off of candida".  She reports a prior history of esophagitis and I reviewed prior GI notes which showed a negative colonoscopy in 2017 and EGD that same year with mild gastritis but no mention of Candida infection.  She is planning to see GI here locally soon for repeat endoscopy.  She is seeing a dermatologist on Friday for her lower extremity skin lesions.   Patient's Medications  New Prescriptions   No medications on file  Previous Medications   AMBULATORY NON FORMULARY MEDICATION    Incontinence pads per patient preference, #unlimited,  refill x99 Fax to [phone]   AMLODIPINE (NORVASC) 5 MG TABLET    1 tab po q day   ARMODAFINIL 250 MG TABLET    1 tab po q day   ARMOUR THYROID 60 MG TABLET    Take 1 tablet (60 mg total) by mouth daily before breakfast.   ASPIRIN EC 325 MG TABLET    Take 1 tablet (325 mg total) by mouth daily.   CARVEDILOL (COREG) 12.5 MG TABLET    TAKE 1 TABLET BY MOUTH TWICE DAILY WITH MEALS   CONTINUOUS BLOOD GLUC SENSOR (DEXCOM G6 SENSOR) MISC    USE TO CONTINUOUSLY MONITOR BLOOD SUGARS. CHANGE EVERY 10 DAYS   CYCLOBENZAPRINE (FLEXERIL) 10 MG TABLET    Take 0.5-1 tablets (5-10 mg total) by mouth 3 (three) times daily as needed for muscle spasms. Caution: can cause drowsiness   DICLOFENAC SODIUM (VOLTAREN) 1 % GEL    Apply 2 g topically daily as needed (Pain).   DIPHENHYDRAMINE (BENADRYL) 25 MG TABLET    Take 25 mg by mouth at bedtime.   DIPHENOXYLATE-ATROPINE (LOMOTIL) 2.5-0.025 MG TABLET    TAKE 1 TABLET BY MOUTH 4 TIMES DAILY AS NEEDED FOR  DIARRHEA  OR  LOOSE  STOOLS   ESOMEPRAZOLE (NEXIUM) 20 MG CAPSULE    Take 20 mg by mouth every morning.    FLUOCINONIDE (LIDEX) 0.05 % EXTERNAL SOLUTION    Apply 1 application topically See admin instructions. Once every three days   GABAPENTIN (NEURONTIN) 100 MG CAPSULE  Take 1-2 capsules (100-200 mg total) by mouth 3 (three) times daily as needed.   GALCANEZUMAB-GNLM (EMGALITY) 120 MG/ML SOAJ    Inject 120 mg into the skin every 30 (thirty) days.   INSULIN HUMAN (INSULIN PUMP) SOLN    Inject into the skin continuous. Novolog insulin   KETOCONAZOLE (NIZORAL) 2 % SHAMPOO    APPLY 1 APPLICATION TOPICALLY 2 (TWO) TIMES A WEEK.   LIDOCAINE-PRILOCAINE (EMLA) CREAM    Apply 1 application topically every 2 (two) hours as needed. Please run with good Rx discount coupon   MELATONIN 5 MG CHEW    Chew 5 mg by mouth daily as needed (Sleep).   NITROGLYCERIN (NITROLINGUAL) 0.4 MG/SPRAY SPRAY    Place 1 spray under the tongue every 5 (five) minutes x 3 doses as needed for chest  pain.   NOVOLOG 100 UNIT/ML INJECTION    Inject 40 Units into the skin daily.   ONDANSETRON (ZOFRAN) 4 MG TABLET    Take 1 tablet (4 mg total) by mouth every 8 (eight) hours as needed for nausea or vomiting.   OVER THE COUNTER MEDICATION    as directed. Magnesium L-Treonate   OXYCODONE (OXY-IR) 5 MG CAPSULE    Take 1 capsule (5 mg total) by mouth every 4 (four) hours as needed.   PANCRELIPASE, LIP-PROT-AMYL, (CREON) 3000-9500 UNITS CPEP    Take 4 capsules by mouth 3 (three) times daily.   PROMETHAZINE (PHENERGAN) 25 MG TABLET    Take 1 tablet (25 mg total) by mouth every 6 (six) hours as needed for nausea or vomiting.   SOD PICOSULFATE-MAG OX-CIT ACD (CLENPIQ) 10-3.5-12 MG-GM -GM/160ML SOLN    Take 1 kit by mouth as directed.   SPIRONOLACTONE (ALDACTONE) 25 MG TABLET    Take 50 mg by mouth daily.   TORSEMIDE ([name]) 20 MG TABLET    TAKE 1 TABLET ONCE DAILY INTHE MORNING. MAY TAKE AN   EXTRA 1/2 TABLET AS        NEEDED   VALACYCLOVIR (VALTREX) 500 MG TABLET    Take 500 mg by mouth daily as needed (Flair up).  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   Family history of adverse reaction to anesthesia    mother: " while she was under she stopped breathing."   Fibromyalgia    Gastroparesis    GERD (gastroesophageal reflux disease)    Headache    migraines   Hypothyroidism    IBS (irritable bowel syndrome)    Idiopathic edema    Iron deficiency anemia 09/04/2019   Lyme disease    Mitral valve prolapse    Myocardial infarction ([place])    1 major in 1999 and 2 minor "  small vessel disease."   Osteoporosis    Peripheral neuropathy    Peripheral vascular disease (HCC)    Sinus disorder    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   Stroke [facility])    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" " the second stroke was a lacunar     Social History   Tobacco Use   Smoking status: Former    Pack years: 0.00    Types: Cigarettes   Smokeless tobacco: Never   Tobacco comments:     " Quit smoking cigarettes in 20's "  Vaping Use   Vaping Use: Never used  Substance Use Topics   Alcohol use: Yes    Comment: occasional beer or wine   Drug use: No    Family History  Problem Relation Age of Onset   Breast cancer Mother    Migraines Mother    Heart disease Father    Hypertension Father    Breast cancer Sister     Allergies  Allergen Reactions   Morphine Anaphylaxis and Shortness Of Breath   Morphine And Related Shortness Of Breath   Penicillins Hives    Tolerated ANCEF on 04/30/20 Has patient had a PCN reaction causing immediate rash, facial/tongue/throat swelling, SOB or lightheadedness with hypotension: Yes Has patient had a PCN reaction causing severe rash involving mucus membranes or skin necrosis: No Has patient had a PCN reaction that required hospitalization No Has patient had a PCN reaction occurring within the last 10 years: No If all of the above answers are "NO", then may proceed with Cephalosporin use.   Tramadol Anaphylaxis   Tramadol-Acetaminophen Other (See Comments)    Small vessel heart attack Other reaction(s): Cardiovascular Arrest (ALLERGY/intolerance), Other (See Comments)    Acetaminophen Other (See Comments)    Alters insulin pump readings    Amitriptyline Other (See Comments)    Severe headache/ out of body feeling   Codeine Other (See Comments)    Severe headaches/ out of body feeling    Ibuprofen Other (See Comments)    Messes up CGM reading on glucose monitor     Losartan Cough   Propoxyphene Other (See Comments)     Severe headaches / out of body feeling    Shellfish Allergy Diarrhea and Nausea And Vomiting   Statins Other (See Comments)    Muscle pains Other reaction(s): Other   Sulfamethoxazole Other (See Comments)    Mouth ulcers    Trichophyton Itching and Other (See Comments)    (fungus) sinusitis    Krill Oil Diarrhea, Itching and Nausea And Vomiting   Fluconazole     Other reaction(s): Contact Dermatitis (intolerance)    ROS As noted above.   OBJECTIVE:    Vitals:   07/22/20 1111  BP: (!) 144/70  Pulse: (!) 58  Temp: 98.2 F (36.8 C)  TempSrc: Oral  Weight: 94 lb (42.6 kg)     Body mass index is 18.99 kg/m.  Physical Exam Constitutional:      General: She is not in acute distress.    Appearance: Normal appearance.  HENT:     Head: Normocephalic and atraumatic.  Pulmonary:     Effort: Pulmonary effort is normal. No respiratory distress.  Musculoskeletal:        General: Normal range of motion.     Right lower leg: Edema present.     Left lower leg: Edema present.  Skin:    General: Skin is warm and dry.     Findings: Lesion present.     Comments: She has mild bilateral lower extremity pitting edema and small areas of excoriation on her right leg greater than left.  There is no evidence of active fungal infection that I can appreciate.   Neurological:  General: No focal deficit present.     Mental Status: She is alert and oriented to person, place, and time.  Psychiatric:        Mood and Affect: Mood normal.        Behavior: Behavior normal.     Labs and Microbiology:  CBC Latest Ref Rng & Units 07/07/2020 06/19/2020 06/16/2020  WBC 4.0 - 10.5 K/uL 5.0 6.5 5.6  Hemoglobin 12.0 - 15.0 g/dL 11.2(L) 12.2 11.9(L)  Hematocrit 36.0 - 46.0 % 34.1(L) 36.9 37.2  Platelets 150 - 400 K/uL 304 392.0 334   CMP Latest Ref Rng & Units 07/07/2020 06/19/2020 06/16/2020  Glucose 70 - 99 mg/dL 259(H) 233(H) 182(H)  BUN 8 - 23 mg/dL 32(H) 28(H) 22  Creatinine 0.44 - 1.00  mg/dL 1.41(H) 1.28(H) 1.24(H)  Sodium 135 - 145 mmol/L 128(L) 129(L) 131(L)  Potassium 3.5 - 5.1 mmol/L 4.5 5.0 4.4  Chloride 98 - 111 mmol/L 91(L) 94(L) 94(L)  CO2 22 - 32 mmol/L _0 Calcium 8.9 - 10.3 mg/dL 9.2 9.0 8.9  Total Protein 6.5 - 8.1 g/dL 5.6(L) 6.1 5.9(L)  Total Bilirubin 0.3 - 1.2 mg/dL 0.4 0.4 0.4  Alkaline Phos 38 - 126 U/L 106 108 106  AST 15 - 41 U/L _1 ALT 0 - 44 U/L _2 ASSESSMENT & PLAN:     Discussed with patient and her husband regarding work up previously and prior treatment.  Her prior labs show no evidence of previous infection with either Lyme or Babesia.  Further discussed that neither cause a chronic disease entity and would not explain her ongoing symptoms.  She has some lower extremity excoriations and rash that she will see dermatology for later this week but does not have an appearance of active Candida infection.  I'm not sure why her previous Lyme literate provider told her of the diagnosis of "Candida overgrowth" but I do not see any indication for antifungals.  She also will see GI for endoscopy but does not currently have symptoms to suggest esophagitis.  I do not have anything further to provide her and she was advised to follow up as needed.     Raynelle [facility] for Infectious Disease [facility] 07/22/2020, 12:09 PM   I spent 60 minutes dedicated to the care of this patient on the date of this encounter to include pre-visit review of records, face-to-face time with the patient discussing candida, lyme, babesia, and post-visit ordering of testing.

## 2020-07-23 ENCOUNTER — Encounter: Payer: No Typology Code available for payment source | Admitting: Gastroenterology

## 2020-07-24 NOTE — Progress Notes (Signed)
Agree with excellent plan. RG 

## 2020-07-28 ENCOUNTER — Other Ambulatory Visit: Payer: Self-pay

## 2020-07-28 ENCOUNTER — Telehealth: Payer: Self-pay | Admitting: Medical

## 2020-07-28 ENCOUNTER — Inpatient Hospital Stay: Payer: Medicare Other | Attending: Hematology & Oncology

## 2020-07-28 ENCOUNTER — Inpatient Hospital Stay: Payer: Medicare Other

## 2020-07-28 VITALS — BP 176/64 | HR 76 | Temp 98.0°F | Resp 18

## 2020-07-28 DIAGNOSIS — Z79899 Other long term (current) drug therapy: Secondary | ICD-10-CM | POA: Diagnosis not present

## 2020-07-28 DIAGNOSIS — N1831 Chronic kidney disease, stage 3a: Secondary | ICD-10-CM | POA: Diagnosis not present

## 2020-07-28 DIAGNOSIS — D631 Anemia in chronic kidney disease: Secondary | ICD-10-CM | POA: Insufficient documentation

## 2020-07-28 DIAGNOSIS — N189 Chronic kidney disease, unspecified: Secondary | ICD-10-CM | POA: Diagnosis present

## 2020-07-28 DIAGNOSIS — E108 Type 1 diabetes mellitus with unspecified complications: Secondary | ICD-10-CM | POA: Diagnosis not present

## 2020-07-28 DIAGNOSIS — N183 Chronic kidney disease, stage 3 unspecified: Secondary | ICD-10-CM

## 2020-07-28 DIAGNOSIS — E1022 Type 1 diabetes mellitus with diabetic chronic kidney disease: Secondary | ICD-10-CM

## 2020-07-28 DIAGNOSIS — D638 Anemia in other chronic diseases classified elsewhere: Secondary | ICD-10-CM

## 2020-07-28 LAB — CBC WITH DIFFERENTIAL (CANCER CENTER ONLY)
Abs Immature Granulocytes: 0.01 10*3/uL (ref 0.00–0.07)
Basophils Absolute: 0 10*3/uL (ref 0.0–0.1)
Basophils Relative: 1 %
Eosinophils Absolute: 0.3 10*3/uL (ref 0.0–0.5)
Eosinophils Relative: 5 %
HCT: 29 % — ABNORMAL LOW (ref 36.0–46.0)
Hemoglobin: 9.7 g/dL — ABNORMAL LOW (ref 12.0–15.0)
Immature Granulocytes: 0 %
Lymphocytes Relative: 22 %
Lymphs Abs: 1.1 10*3/uL (ref 0.7–4.0)
MCH: 31 pg (ref 26.0–34.0)
MCHC: 33.4 g/dL (ref 30.0–36.0)
MCV: 92.7 fL (ref 80.0–100.0)
Monocytes Absolute: 0.4 10*3/uL (ref 0.1–1.0)
Monocytes Relative: 8 %
Neutro Abs: 3.2 10*3/uL (ref 1.7–7.7)
Neutrophils Relative %: 64 %
Platelet Count: 279 10*3/uL (ref 150–400)
RBC: 3.13 MIL/uL — ABNORMAL LOW (ref 3.87–5.11)
RDW: 14.4 % (ref 11.5–15.5)
WBC Count: 5.1 10*3/uL (ref 4.0–10.5)
nRBC: 0 % (ref 0.0–0.2)

## 2020-07-28 LAB — CMP (CANCER CENTER ONLY)
ALT: 18 U/L (ref 0–44)
AST: 23 U/L (ref 15–41)
Albumin: 3.5 g/dL (ref 3.5–5.0)
Alkaline Phosphatase: 117 U/L (ref 38–126)
Anion gap: 9 (ref 5–15)
BUN: 30 mg/dL — ABNORMAL HIGH (ref 8–23)
CO2: 24 mmol/L (ref 22–32)
Calcium: 8.9 mg/dL (ref 8.9–10.3)
Chloride: 98 mmol/L (ref 98–111)
Creatinine: 1.23 mg/dL — ABNORMAL HIGH (ref 0.44–1.00)
GFR, Estimated: 48 mL/min — ABNORMAL LOW (ref >60)
Glucose, Bld: 288 mg/dL — ABNORMAL HIGH (ref 70–99)
Potassium: 5.3 mmol/L — ABNORMAL HIGH (ref 3.5–5.1)
Sodium: 131 mmol/L — ABNORMAL LOW (ref 135–145)
Total Bilirubin: 0.3 mg/dL (ref 0.3–1.2)
Total Protein: 5.6 g/dL — ABNORMAL LOW (ref 6.5–8.1)

## 2020-07-28 MED ORDER — DARBEPOETIN ALFA 300 MCG/0.6ML IJ SOSY
300.0000 ug | PREFILLED_SYRINGE | Freq: Once | INTRAMUSCULAR | Status: AC
  Administered 2020-07-28: 300 ug via SUBCUTANEOUS

## 2020-07-28 MED ORDER — DARBEPOETIN ALFA 300 MCG/0.6ML IJ SOSY
PREFILLED_SYRINGE | INTRAMUSCULAR | Status: AC
  Filled 2020-07-28: qty 0.6

## 2020-07-28 NOTE — Telephone Encounter (Signed)
Pt needs 40 minutes appointment times. Very complicated history. For time being would always try to give her 11 and 11:20 slots(but not on day that I have lunch meeting). That way can have option to run into lunch. No one has to wait for me  as I fall behind and then she can go to lab. Please make a note of this.

## 2020-07-28 NOTE — Patient Instructions (Signed)
Darbepoetin Alfa injection What is this medication? DARBEPOETIN ALFA (dar be POE e tin AL fa) helps your body make more red blood cells. It is used to treat anemia caused by chronic kidney failure and chemotherapy. This medicine may be used for other purposes; ask your health care provider or pharmacist if you have questions. COMMON BRAND NAME(S): Aranesp What should I tell my care team before I take this medication? They need to know if you have any of these conditions: blood clotting disorders or history of blood clots cancer patient not on chemotherapy cystic fibrosis heart disease, such as angina, heart failure, or a history of a heart attack hemoglobin level of 12 g/dL or greater high blood pressure low levels of folate, iron, or vitamin B12 seizures an unusual or allergic reaction to darbepoetin, erythropoietin, albumin, hamster proteins, latex, other medicines, foods, dyes, or preservatives pregnant or trying to get pregnant breast-feeding How should I use this medication? This medicine is for injection into a vein or under the skin. It is usually given by a health care professional in a hospital or clinic setting. If you get this medicine at home, you will be taught how to prepare and give this medicine. Use exactly as directed. Take your medicine at regular intervals. Do not take your medicine more often than directed. It is important that you put your used needles and syringes in a special sharps container. Do not put them in a trash can. If you do not have a sharps container, call your pharmacist or healthcare provider to get one. A special MedGuide will be given to you by the pharmacist with each prescription and refill. Be sure to read this information carefully each time. Talk to your pediatrician regarding the use of this medicine in children. While this medicine may be used in children as young as 1 month of age for selected conditions, precautions do apply. Overdosage: If you  think you have taken too much of this medicine contact a poison control center or emergency room at once. NOTE: This medicine is only for you. Do not share this medicine with others. What if I miss a dose? If you miss a dose, take it as soon as you can. If it is almost time for your next dose, take only that dose. Do not take double or extra doses. What may interact with this medication? Do not take this medicine with any of the following medications: epoetin alfa This list may not describe all possible interactions. Give your health care provider a list of all the medicines, herbs, non-prescription drugs, or dietary supplements you use. Also tell them if you smoke, drink alcohol, or use illegal drugs. Some items may interact with your medicine. What should I watch for while using this medication? Your condition will be monitored carefully while you are receiving this medicine. You may need blood work done while you are taking this medicine. This medicine may cause a decrease in vitamin B6. You should make sure that you get enough vitamin B6 while you are taking this medicine. Discuss the foods you eat and the vitamins you take with your health care professional. What side effects may I notice from receiving this medication? Side effects that you should report to your doctor or health care professional as soon as possible: allergic reactions like skin rash, itching or hives, swelling of the face, lips, or tongue breathing problems changes in vision chest pain confusion, trouble speaking or understanding feeling faint or lightheaded, falls high blood pressure   muscle aches or pains pain, swelling, warmth in the leg rapid weight gain severe headaches sudden numbness or weakness of the face, arm or leg trouble walking, dizziness, loss of balance or coordination seizures (convulsions) swelling of the ankles, feet, hands unusually weak or tired Side effects that usually do not require medical  attention (report to your doctor or health care professional if they continue or are bothersome): diarrhea fever, chills (flu-like symptoms) headaches nausea, vomiting redness, stinging, or swelling at site where injected This list may not describe all possible side effects. Call your doctor for medical advice about side effects. You may report side effects to FDA at 1-800-FDA-1088. Where should I keep my medication? Keep out of the reach of children. Store in a refrigerator between 2 and 8 degrees C (36 and 46 degrees F). Do not freeze. Do not shake. Throw away any unused portion if using a single-dose vial. Throw away any unused medicine after the expiration date. NOTE: This sheet is a summary. It may not cover all possible information. If you have questions about this medicine, talk to your doctor, pharmacist, or health care provider.  2022 Elsevier/Gold Standard (2017-01-25 16:44:20)  

## 2020-07-29 ENCOUNTER — Encounter: Payer: Self-pay | Admitting: Medical

## 2020-07-30 ENCOUNTER — Encounter: Payer: Self-pay | Admitting: Medical

## 2020-07-31 ENCOUNTER — Telehealth: Payer: Self-pay | Admitting: Medical

## 2020-07-31 MED ORDER — AMLODIPINE BESYLATE 5 MG PO TABS: 5.0000 mg | ORAL_TABLET | Freq: Every day | ORAL | 1 refills | Status: DC

## 2020-07-31 NOTE — Telephone Encounter (Signed)
Opened to send in amlodipine.

## 2020-08-10 ENCOUNTER — Other Ambulatory Visit: Payer: Self-pay | Admitting: Osteopathic Medicine

## 2020-08-15 ENCOUNTER — Other Ambulatory Visit: Payer: Self-pay | Admitting: Medical

## 2020-08-15 NOTE — Addendum Note (Signed)
Addended by: Anabel Halon on: 08/15/2020 09:15 AM   Modules accepted: Orders

## 2020-08-16 ENCOUNTER — Encounter: Payer: Self-pay | Admitting: Medical

## 2020-08-17 ENCOUNTER — Telehealth: Payer: Self-pay | Admitting: Medical

## 2020-08-17 DIAGNOSIS — E871 Hypo-osmolality and hyponatremia: Secondary | ICD-10-CM

## 2020-08-17 DIAGNOSIS — R790 Abnormal level of blood mineral: Secondary | ICD-10-CM

## 2020-08-17 NOTE — Telephone Encounter (Signed)
Future labs placed. 

## 2020-08-18 ENCOUNTER — Other Ambulatory Visit: Payer: Self-pay

## 2020-08-18 ENCOUNTER — Inpatient Hospital Stay: Payer: Medicare Other

## 2020-08-18 VITALS — BP 156/63 | HR 82 | Temp 97.9°F | Resp 20

## 2020-08-18 DIAGNOSIS — D638 Anemia in other chronic diseases classified elsewhere: Secondary | ICD-10-CM

## 2020-08-18 DIAGNOSIS — D631 Anemia in chronic kidney disease: Secondary | ICD-10-CM

## 2020-08-18 DIAGNOSIS — N1831 Chronic kidney disease, stage 3a: Secondary | ICD-10-CM

## 2020-08-18 DIAGNOSIS — N183 Chronic kidney disease, stage 3 unspecified: Secondary | ICD-10-CM

## 2020-08-18 DIAGNOSIS — E1022 Type 1 diabetes mellitus with diabetic chronic kidney disease: Secondary | ICD-10-CM

## 2020-08-18 DIAGNOSIS — N189 Chronic kidney disease, unspecified: Secondary | ICD-10-CM | POA: Diagnosis not present

## 2020-08-18 LAB — CBC WITH DIFFERENTIAL (CANCER CENTER ONLY)
Abs Immature Granulocytes: 0.04 10*3/uL (ref 0.00–0.07)
Basophils Absolute: 0.1 10*3/uL (ref 0.0–0.1)
Basophils Relative: 1 %
Eosinophils Absolute: 0.5 10*3/uL (ref 0.0–0.5)
Eosinophils Relative: 8 %
HCT: 31.6 % — ABNORMAL LOW (ref 36.0–46.0)
Hemoglobin: 10.1 g/dL — ABNORMAL LOW (ref 12.0–15.0)
Immature Granulocytes: 1 %
Lymphocytes Relative: 13 %
Lymphs Abs: 0.9 10*3/uL (ref 0.7–4.0)
MCH: 31.9 pg (ref 26.0–34.0)
MCHC: 32 g/dL (ref 30.0–36.0)
MCV: 99.7 fL (ref 80.0–100.0)
Monocytes Absolute: 0.8 10*3/uL (ref 0.1–1.0)
Monocytes Relative: 10 %
Neutro Abs: 4.9 10*3/uL (ref 1.7–7.7)
Neutrophils Relative %: 67 %
Platelet Count: 300 10*3/uL (ref 150–400)
RBC: 3.17 MIL/uL — ABNORMAL LOW (ref 3.87–5.11)
RDW: 16.6 % — ABNORMAL HIGH (ref 11.5–15.5)
WBC Count: 7.2 10*3/uL (ref 4.0–10.5)
nRBC: 0 % (ref 0.0–0.2)

## 2020-08-18 LAB — CMP (CANCER CENTER ONLY)
ALT: 15 U/L (ref 0–44)
AST: 21 U/L (ref 15–41)
Albumin: 3.5 g/dL (ref 3.5–5.0)
Alkaline Phosphatase: 126 U/L (ref 38–126)
Anion gap: 7 (ref 5–15)
BUN: 27 mg/dL — ABNORMAL HIGH (ref 8–23)
CO2: 27 mmol/L (ref 22–32)
Calcium: 9 mg/dL (ref 8.9–10.3)
Chloride: 93 mmol/L — ABNORMAL LOW (ref 98–111)
Creatinine: 1.27 mg/dL — ABNORMAL HIGH (ref 0.44–1.00)
GFR, Estimated: 47 mL/min — ABNORMAL LOW (ref >60)
Glucose, Bld: 179 mg/dL — ABNORMAL HIGH (ref 70–99)
Potassium: 4.6 mmol/L (ref 3.5–5.1)
Sodium: 127 mmol/L — ABNORMAL LOW (ref 135–145)
Total Bilirubin: 0.3 mg/dL (ref 0.3–1.2)
Total Protein: 5.9 g/dL — ABNORMAL LOW (ref 6.5–8.1)

## 2020-08-18 MED ORDER — DARBEPOETIN ALFA 300 MCG/0.6ML IJ SOSY
300.0000 ug | PREFILLED_SYRINGE | Freq: Once | INTRAMUSCULAR | Status: AC
  Administered 2020-08-18: 300 ug via SUBCUTANEOUS

## 2020-08-18 MED ORDER — DARBEPOETIN ALFA 300 MCG/0.6ML IJ SOSY
PREFILLED_SYRINGE | INTRAMUSCULAR | Status: AC
  Filled 2020-08-18: qty 0.6

## 2020-08-18 NOTE — Patient Instructions (Signed)
Darbepoetin Alfa injection What is this medication? DARBEPOETIN ALFA (dar be POE e tin AL fa) helps your body make more red blood cells. It is used to treat anemia caused by chronic kidney failure and chemotherapy. This medicine may be used for other purposes; ask your health care provider or pharmacist if you have questions. COMMON BRAND NAME(S): Aranesp What should I tell my care team before I take this medication? They need to know if you have any of these conditions: blood clotting disorders or history of blood clots cancer patient not on chemotherapy cystic fibrosis heart disease, such as angina, heart failure, or a history of a heart attack hemoglobin level of 12 g/dL or greater high blood pressure low levels of folate, iron, or vitamin B12 seizures an unusual or allergic reaction to darbepoetin, erythropoietin, albumin, hamster proteins, latex, other medicines, foods, dyes, or preservatives pregnant or trying to get pregnant breast-feeding How should I use this medication? This medicine is for injection into a vein or under the skin. It is usually given by a health care professional in a hospital or clinic setting. If you get this medicine at home, you will be taught how to prepare and give this medicine. Use exactly as directed. Take your medicine at regular intervals. Do not take your medicine more often than directed. It is important that you put your used needles and syringes in a special sharps container. Do not put them in a trash can. If you do not have a sharps container, call your pharmacist or healthcare provider to get one. A special MedGuide will be given to you by the pharmacist with each prescription and refill. Be sure to read this information carefully each time. Talk to your pediatrician regarding the use of this medicine in children. While this medicine may be used in children as young as 1 month of age for selected conditions, precautions do apply. Overdosage: If you  think you have taken too much of this medicine contact a poison control center or emergency room at once. NOTE: This medicine is only for you. Do not share this medicine with others. What if I miss a dose? If you miss a dose, take it as soon as you can. If it is almost time for your next dose, take only that dose. Do not take double or extra doses. What may interact with this medication? Do not take this medicine with any of the following medications: epoetin alfa This list may not describe all possible interactions. Give your health care provider a list of all the medicines, herbs, non-prescription drugs, or dietary supplements you use. Also tell them if you smoke, drink alcohol, or use illegal drugs. Some items may interact with your medicine. What should I watch for while using this medication? Your condition will be monitored carefully while you are receiving this medicine. You may need blood work done while you are taking this medicine. This medicine may cause a decrease in vitamin B6. You should make sure that you get enough vitamin B6 while you are taking this medicine. Discuss the foods you eat and the vitamins you take with your health care professional. What side effects may I notice from receiving this medication? Side effects that you should report to your doctor or health care professional as soon as possible: allergic reactions like skin rash, itching or hives, swelling of the face, lips, or tongue breathing problems changes in vision chest pain confusion, trouble speaking or understanding feeling faint or lightheaded, falls high blood pressure   muscle aches or pains pain, swelling, warmth in the leg rapid weight gain severe headaches sudden numbness or weakness of the face, arm or leg trouble walking, dizziness, loss of balance or coordination seizures (convulsions) swelling of the ankles, feet, hands unusually weak or tired Side effects that usually do not require medical  attention (report to your doctor or health care professional if they continue or are bothersome): diarrhea fever, chills (flu-like symptoms) headaches nausea, vomiting redness, stinging, or swelling at site where injected This list may not describe all possible side effects. Call your doctor for medical advice about side effects. You may report side effects to FDA at 1-800-FDA-1088. Where should I keep my medication? Keep out of the reach of children. Store in a refrigerator between 2 and 8 degrees C (36 and 46 degrees F). Do not freeze. Do not shake. Throw away any unused portion if using a single-dose vial. Throw away any unused medicine after the expiration date. NOTE: This sheet is a summary. It may not cover all possible information. If you have questions about this medicine, talk to your doctor, pharmacist, or health care provider.  2022 Elsevier/Gold Standard (2017-01-25 16:44:20)  

## 2020-08-20 ENCOUNTER — Encounter: Payer: Self-pay | Admitting: Medical

## 2020-08-20 ENCOUNTER — Encounter: Payer: Self-pay | Admitting: Hematology & Oncology

## 2020-08-21 ENCOUNTER — Telehealth: Payer: Self-pay | Admitting: *Deleted

## 2020-08-21 NOTE — Telephone Encounter (Signed)
Per scheduling message Erline Levine 08/21/20 called and lvm of upcoming appointment - requested callback to confirm

## 2020-09-02 ENCOUNTER — Encounter: Payer: Self-pay | Admitting: Family

## 2020-09-08 ENCOUNTER — Other Ambulatory Visit: Payer: Self-pay

## 2020-09-08 ENCOUNTER — Inpatient Hospital Stay: Payer: Medicare Other

## 2020-09-08 ENCOUNTER — Encounter: Payer: Self-pay | Admitting: Hematology & Oncology

## 2020-09-08 ENCOUNTER — Inpatient Hospital Stay (HOSPITAL_BASED_OUTPATIENT_CLINIC_OR_DEPARTMENT_OTHER): Payer: Medicare Other | Admitting: Hematology & Oncology

## 2020-09-08 ENCOUNTER — Encounter: Payer: Self-pay | Admitting: Family

## 2020-09-08 ENCOUNTER — Inpatient Hospital Stay: Payer: Medicare Other | Attending: Hematology & Oncology

## 2020-09-08 VITALS — BP 167/81 | HR 63 | Temp 97.7°F | Resp 18 | Wt 97.0 lb

## 2020-09-08 DIAGNOSIS — N189 Chronic kidney disease, unspecified: Secondary | ICD-10-CM | POA: Insufficient documentation

## 2020-09-08 DIAGNOSIS — N1831 Chronic kidney disease, stage 3a: Secondary | ICD-10-CM

## 2020-09-08 DIAGNOSIS — D649 Anemia, unspecified: Secondary | ICD-10-CM

## 2020-09-08 DIAGNOSIS — Z79899 Other long term (current) drug therapy: Secondary | ICD-10-CM | POA: Diagnosis not present

## 2020-09-08 DIAGNOSIS — D631 Anemia in chronic kidney disease: Secondary | ICD-10-CM | POA: Insufficient documentation

## 2020-09-08 DIAGNOSIS — R599 Enlarged lymph nodes, unspecified: Secondary | ICD-10-CM | POA: Diagnosis not present

## 2020-09-08 DIAGNOSIS — D508 Other iron deficiency anemias: Secondary | ICD-10-CM

## 2020-09-08 LAB — CBC WITH DIFFERENTIAL (CANCER CENTER ONLY)
Abs Immature Granulocytes: 0.03 10*3/uL (ref 0.00–0.07)
Basophils Absolute: 0.1 10*3/uL (ref 0.0–0.1)
Basophils Relative: 1 %
Eosinophils Absolute: 0.7 10*3/uL — ABNORMAL HIGH (ref 0.0–0.5)
Eosinophils Relative: 7 %
HCT: 35.1 % — ABNORMAL LOW (ref 36.0–46.0)
Hemoglobin: 11.5 g/dL — ABNORMAL LOW (ref 12.0–15.0)
Immature Granulocytes: 0 %
Lymphocytes Relative: 8 %
Lymphs Abs: 0.9 10*3/uL (ref 0.7–4.0)
MCH: 31.9 pg (ref 26.0–34.0)
MCHC: 32.8 g/dL (ref 30.0–36.0)
MCV: 97.5 fL (ref 80.0–100.0)
Monocytes Absolute: 0.8 10*3/uL (ref 0.1–1.0)
Monocytes Relative: 8 %
Neutro Abs: 8.1 10*3/uL — ABNORMAL HIGH (ref 1.7–7.7)
Neutrophils Relative %: 76 %
Platelet Count: 277 10*3/uL (ref 150–400)
RBC: 3.6 MIL/uL — ABNORMAL LOW (ref 3.87–5.11)
RDW: 14.5 % (ref 11.5–15.5)
WBC Count: 10.6 10*3/uL — ABNORMAL HIGH (ref 4.0–10.5)
nRBC: 0 % (ref 0.0–0.2)

## 2020-09-08 LAB — CMP (CANCER CENTER ONLY)
ALT: 15 U/L (ref 0–44)
AST: 26 U/L (ref 15–41)
Albumin: 3.6 g/dL (ref 3.5–5.0)
Alkaline Phosphatase: 106 U/L (ref 38–126)
Anion gap: 9 (ref 5–15)
BUN: 43 mg/dL — ABNORMAL HIGH (ref 8–23)
CO2: 27 mmol/L (ref 22–32)
Calcium: 9 mg/dL (ref 8.9–10.3)
Chloride: 91 mmol/L — ABNORMAL LOW (ref 98–111)
Creatinine: 1.34 mg/dL — ABNORMAL HIGH (ref 0.44–1.00)
GFR, Estimated: 44 mL/min — ABNORMAL LOW (ref >60)
Glucose, Bld: 191 mg/dL — ABNORMAL HIGH (ref 70–99)
Potassium: 4.7 mmol/L (ref 3.5–5.1)
Sodium: 127 mmol/L — ABNORMAL LOW (ref 135–145)
Total Bilirubin: 0.6 mg/dL (ref 0.3–1.2)
Total Protein: 5.5 g/dL — ABNORMAL LOW (ref 6.5–8.1)

## 2020-09-08 LAB — RETICULOCYTES
Immature Retic Fract: 8 % (ref 2.3–15.9)
RBC.: 3.62 MIL/uL — ABNORMAL LOW (ref 3.87–5.11)
Retic Count, Absolute: 52.9 10*3/uL (ref 19.0–186.0)
Retic Ct Pct: 1.5 % (ref 0.4–3.1)

## 2020-09-08 NOTE — Progress Notes (Signed)
Hematology and Oncology Follow Up Visit  Brittney Pace [id number] [birth year] 65 y.o. 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  I know she has been having some difficulties.  I just had the fact that she has been having some problems.  She wanted me to tell her about a Engineer, manufacturing.  I told her that this is something that her family doctor does.  I am not sure she has a medical doctor right now who does family practice for her.  I know there is been some issues with the internal medicine group in our building.  I know she has had problems with her blood sugars.  Her diabetes, in my opinion, will dictate her prognosis.  Her blood sugars are 191.    She still wants to see an infectious disease doctor.  I am unsure exactly what the problem with the past infectious disease specialist was.  She feels that she has Candida.  She has had flareups of babesiosis.  I am surprised about the babesiosis since this is a disease usually up in [place].  She is always worried about some enlarged lymph nodes that she was told about in her abdomen.  The last CT scan that I see on her was back in April 2021.  There was nothing there that looked suspicious for lymphadenopathy.  However there was a suspicious lesion in the upper pole of the right kidney.  She then had a follow-up MRI done on 07/01/2019.  This showed a 2.4 cm indeterminate probably benign cystic lesion in the right kidney.  I do not think it would be a problem to repeat a CT scan on her to see exactly what might be going on, if anything.  She is post to have a colonoscopy.  I am still not sure why this has not been done.  I am sure that she has not scheduled this.  She is post to see a dermatologist today.  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  Thankfully, she does not need any Aranesp today.  Her hemoglobin is 11.5.  She has not had a mammogram for about 6 years.  She says that because of fibromyalgia, she really cannot have a mammogram.  She has had no obvious bleeding.  I would have to say that overall, her performance status is probably ECOG 2.    Medications:  Allergies as of 09/08/2020       Reactions   Morphine Anaphylaxis, Shortness Of Breath   Morphine And Related Shortness Of Breath   Penicillins Hives   Tolerated ANCEF on 04/30/20 Has patient had a PCN reaction causing immediate rash, facial/tongue/throat swelling, SOB or lightheadedness with hypotension: Yes Has patient had a PCN reaction causing severe rash involving mucus membranes or skin necrosis: No Has patient had a PCN reaction that required hospitalization No Has patient had a PCN reaction occurring within the last 10 years: No If all of the above answers are "NO", then may proceed with Cephalosporin use.   Tramadol Anaphylaxis   Tramadol-acetaminophen Other (See Comments)   Small vessel heart attack Other reaction(s): Cardiovascular Arrest (ALLERGY/intolerance), Other (See Comments)   Acetaminophen Other (See Comments)   Alters insulin  pump readings   Amitriptyline Other (See Comments)   Severe headache/ out of body feeling   Codeine Other (See Comments)   Severe headaches/ out of body feeling   Ibuprofen Other (See Comments)   Messes up CGM reading on glucose monitor   Losartan Cough   Propoxyphene Other (See Comments)   Severe headaches / out of body feeling   Shellfish Allergy Diarrhea, Nausea And Vomiting   Statins Other (See Comments)   Muscle pains Other reaction(s): Other   Sulfamethoxazole Other (See Comments)   Mouth ulcers   Trichophyton Itching, Other (See Comments)   (fungus) sinusitis   Krill Oil Diarrhea, Itching, Nausea And Vomiting   Norvasc [amlodipine  Besylate] Swelling   Fluconazole    Other reaction(s): Contact Dermatitis (intolerance)        Medication List        Accurate as of September 08, 2020  1:33 PM. If you have any questions, ask your nurse or doctor.          STOP taking these medications    amLODipine 5 MG tablet Commonly known as: NORVASC Stopped by: [name], MD       TAKE these medications    AMBULATORY NON FORMULARY MEDICATION Incontinence pads per patient preference, #unlimited, refill [phone] Fax to [phone]   Armodafinil 250 MG tablet 1 tab po q day   Armour Thyroid 60 MG tablet Generic drug: thyroid Take 1 tablet (60 mg total) by mouth daily before breakfast.   aspirin EC 325 MG tablet Take 1 tablet (325 mg total) by mouth daily. What changed: when to take this   carvedilol 12.5 MG tablet Commonly known as: COREG TAKE 1 TABLET BY MOUTH TWICE DAILY WITH MEALS   Clenpiq 10-3.5-12 MG-GM -GM/160ML Soln Generic drug: Sod Picosulfate-Mag Ox-Cit Acd Take 1 kit by mouth as directed.   Creon 3000-9500 units Cpep Generic drug: Pancrelipase (Lip-Prot-Amyl) Take 4 capsules by mouth 3 (three) times daily.   cyclobenzaprine 10 MG tablet Commonly known as: FLEXERIL Take 0.5-1 tablets (5-10 mg total) by mouth 3 (three) times daily as needed for muscle spasms. Caution: can cause drowsiness   Dexcom G6 Sensor Misc USE TO CONTINUOUSLY MONITOR BLOOD SUGARS. CHANGE EVERY 10 DAYS   diclofenac Sodium 1 % Gel Commonly known as: VOLTAREN Apply 2 g topically daily as needed (Pain).   diphenhydrAMINE 25 MG tablet Commonly known as: BENADRYL Take 25 mg by mouth at bedtime.   diphenoxylate-atropine 2.5-0.025 MG tablet Commonly known as: LOMOTIL TAKE 1 TABLET BY MOUTH 4 TIMES DAILY AS NEEDED FOR  DIARRHEA  OR  LOOSE  STOOLS What changed: See the new instructions.   Emgality 120 MG/ML Soaj Generic drug: Galcanezumab-gnlm Inject 120 mg into the skin every 30 (thirty) days.   esomeprazole 20  MG capsule Commonly known as: NEXIUM Take 20 mg by mouth every morning.   fluocinonide 0.05 % external solution Commonly known as: LIDEX Apply 1 application topically See admin instructions. Once every three days   gabapentin 100 MG capsule Commonly known as: NEURONTIN Take 1-2 capsules (100-200 mg total) by mouth 3 (three) times daily as needed. What changed:  when to take this additional instructions   insulin pump Soln Inject into the skin continuous. Novolog insulin   ketoconazole 2 % shampoo Commonly known as: NIZORAL APPLY 1 APPLICATION TOPICALLY 2 (TWO) TIMES A WEEK.   lidocaine-prilocaine cream Commonly known as: EMLA Apply 1 application topically every 2 (two) hours as needed. Please run with good  Rx discount coupon   Melatonin 5 MG Chew Chew 5 mg by mouth daily as needed (Sleep).   nitroGLYCERIN 0.4 MG/SPRAY spray Commonly known as: NITROLINGUAL Place 1 spray under the tongue every 5 (five) minutes x 3 doses as needed for chest pain.   NovoLOG 100 UNIT/ML injection Generic drug: insulin aspart Inject 40 Units into the skin daily.   ondansetron 4 MG tablet Commonly known as: Zofran Take 1 tablet (4 mg total) by mouth every 8 (eight) hours as needed for nausea or vomiting.   OVER THE COUNTER MEDICATION as directed. Magnesium L-Treonate   oxycodone 5 MG capsule Commonly known as: OXY-IR Take 1 capsule (5 mg total) by mouth every 4 (four) hours as needed.   promethazine 25 MG tablet Commonly known as: PHENERGAN Take 1 tablet (25 mg total) by mouth every 6 (six) hours as needed for nausea or vomiting.   spironolactone 25 MG tablet Commonly known as: ALDACTONE Take 50 mg by mouth daily.   torsemide 20 MG tablet Commonly known as: [name] TAKE 1 TABLET ONCE DAILY INTHE MORNING. MAY TAKE AN   EXTRA 1/2 TABLET AS        NEEDED What changed: See the new instructions.   valACYclovir 500 MG tablet Commonly known as: VALTREX Take 500 mg by mouth daily as  needed (Flair up).        Allergies:  Allergies  Allergen Reactions   Morphine Anaphylaxis and Shortness Of Breath   Morphine And Related Shortness Of Breath   Penicillins Hives    Tolerated ANCEF on 04/30/20 Has patient had a PCN reaction causing immediate rash, facial/tongue/throat swelling, SOB or lightheadedness with hypotension: Yes Has patient had a PCN reaction causing severe rash involving mucus membranes or skin necrosis: No Has patient had a PCN reaction that required hospitalization No Has patient had a PCN reaction occurring within the last 10 years: No If all of the above answers are "NO", then may proceed with Cephalosporin use.   Tramadol Anaphylaxis   Tramadol-Acetaminophen Other (See Comments)    Small vessel heart attack Other reaction(s): Cardiovascular Arrest (ALLERGY/intolerance), Other (See Comments)    Acetaminophen Other (See Comments)    Alters insulin pump readings    Amitriptyline Other (See Comments)    Severe headache/ out of body feeling   Codeine Other (See Comments)    Severe headaches/ out of body feeling    Ibuprofen Other (See Comments)    Messes up CGM reading on glucose monitor     Losartan Cough   Propoxyphene Other (See Comments)    Severe headaches / out of body feeling    Shellfish Allergy Diarrhea and Nausea And Vomiting   Statins Other (See Comments)    Muscle pains Other reaction(s): Other   Sulfamethoxazole Other (See Comments)    Mouth ulcers    Trichophyton Itching and Other (See Comments)    (fungus) sinusitis    Krill Oil Diarrhea, Itching and Nausea And Vomiting   Norvasc [Amlodipine Besylate] Swelling   Fluconazole     Other reaction(s): Contact Dermatitis (intolerance)    Past Medical History, Surgical history, Social history, and Family History were reviewed and updated.  Review of Systems: Review of Systems  Constitutional:  Positive for malaise/fatigue.  HENT: Negative.    Eyes: Negative.    Respiratory: Negative.    Cardiovascular:  Positive for leg swelling.  Gastrointestinal:  Positive for abdominal pain.  Genitourinary: Negative.   Musculoskeletal:  Positive for joint pain and myalgias.  Skin:  Positive for rash.  Neurological: Negative.   Endo/Heme/Allergies: Negative.   Psychiatric/Behavioral: Negative.      Physical Exam:  weight is 97 lb (44 kg). Her oral temperature is 97.7 F (36.5 C). Her blood pressure is 167/81 (abnormal) and her pulse is 63. Her respiration is 18.   Wt Readings from Last 3 Encounters:  09/08/20 97 lb (44 kg)  07/22/20 94 lb (42.6 kg)  07/13/20 95 lb 12.8 oz (43.5 kg)    Physical Exam Vitals reviewed.  HENT:     Head: Normocephalic and atraumatic.  Eyes:     Pupils: Pupils are equal, round, and reactive to light.  Cardiovascular:     Rate and Rhythm: Normal rate and regular rhythm.     Heart sounds: Normal heart sounds.  Pulmonary:     Effort: Pulmonary effort is normal.     Breath sounds: Normal breath sounds.  Abdominal:     General: Bowel sounds are normal.     Palpations: Abdomen is soft.  Musculoskeletal:        General: No tenderness or deformity. Normal range of motion.     Cervical back: Normal range of motion.     Comments: On her extremities, there is some edema in her legs.  She has about 2+ edema.  She has a macular type reddish rash in the lower extremities.  This is in the areas above her ankles.  There is a couple areas that might be a little bit open.  Lymphadenopathy:     Cervical: No cervical adenopathy.  Skin:    General: Skin is warm and dry.     Findings: No erythema or rash.  Neurological:     Mental Status: She is alert and oriented to person, place, and time.  Psychiatric:        Behavior: Behavior normal.        Thought Content: Thought content normal.        Judgment: Judgment normal.    Lab Results  Component Value Date   WBC 10.6 (H) 09/08/2020   HGB 11.5 (L) 09/08/2020   HCT 35.1 (L)  09/08/2020   MCV 97.5 09/08/2020   PLT 277 09/08/2020   Lab Results  Component Value Date   FERRITIN 160 07/07/2020   IRON 126 07/07/2020   TIBC 285 07/07/2020   UIBC 159 07/07/2020   IRONPCTSAT 44 07/07/2020   Lab Results  Component Value Date   RETICCTPCT 1.5 09/08/2020   RBC 3.62 (L) 09/08/2020   No results found for: KPAFRELGTCHN, LAMBDASER, KAPLAMBRATIO No results found for: IGGSERUM, IGA, IGMSERUM No results found for: Ronnald Ramp, A1GS, A2GS, BETS, BETA2SER, GAMS, MSPIKE, SPEI   Chemistry      Component Value Date/Time   NA 127 (L) 09/08/2020 1159   NA 138 07/17/2017 1128   K 4.7 09/08/2020 1159   CL 91 (L) 09/08/2020 1159   CO2 27 09/08/2020 1159   BUN 43 (H) 09/08/2020 1159   BUN 23 07/17/2017 1128   CREATININE 1.34 (H) 09/08/2020 1159   CREATININE 0.99 07/20/2018 0821      Component Value Date/Time   CALCIUM 9.0 09/08/2020 1159   ALKPHOS 106 09/08/2020 1159   AST 26 09/08/2020 1159   ALT 15 09/08/2020 1159   BILITOT 0.6 09/08/2020 1159       Impression and Plan: Brittney Pace is a very pleasant 65 yo female with multifactorial anemia.  I am happy that her anemia is not a problem today.  I just know that she has other issues that need to be addressed.  We will do the CT scan.  We will try to set this up for about a week.  Maybe, we can then see if there is anything that we find that might be suspicious.  Hopefully, she will be able to see Infectious Disease.  Hopefully she will have the colonoscopy done.  She is seen about try to get the Medicare to approve the CGM for her glucose pump.  I think this would be very important for her diabetic management as I believe the diabetes will be determined of her future prognosis.  For right now, we will plan to get her back to see [place] in about 6 weeks or so.  We can get her back sooner if there are problems with the scan. No ESA needed today, Hgb 11.2.  Iron studies are pending.  She will increase her  oral hydration and contact us if she feels she needs IV fluids.  Lab and injection every 3 weeks and follow-up in 12 weeks.  She will contact our office with any questions or concerns.   [name], MD 8/16/20221:33 PM

## 2020-09-09 ENCOUNTER — Telehealth: Payer: Self-pay

## 2020-09-09 LAB — IRON AND TIBC
Iron: 169 ug/dL — ABNORMAL HIGH (ref 41–142)
Saturation Ratios: 56 % (ref 21–57)
TIBC: 300 ug/dL (ref 236–444)
UIBC: 131 ug/dL (ref 120–384)

## 2020-09-09 LAB — FERRITIN: Ferritin: 88 ng/mL (ref 11–307)

## 2020-09-17 ENCOUNTER — Ambulatory Visit (HOSPITAL_BASED_OUTPATIENT_CLINIC_OR_DEPARTMENT_OTHER): Payer: Medicare Other

## 2020-09-18 ENCOUNTER — Other Ambulatory Visit: Payer: Self-pay | Admitting: Hematology & Oncology

## 2020-09-18 ENCOUNTER — Ambulatory Visit (HOSPITAL_BASED_OUTPATIENT_CLINIC_OR_DEPARTMENT_OTHER)
Admission: RE | Admit: 2020-09-18 | Discharge: 2020-09-18 | Disposition: A | Discharge status: Home / Self Care | Payer: Medicare Other | Source: Ambulatory Visit | Attending: Hematology & Oncology | Admitting: Hematology & Oncology

## 2020-09-18 ENCOUNTER — Encounter: Payer: Self-pay | Admitting: Family

## 2020-09-18 ENCOUNTER — Other Ambulatory Visit: Payer: Self-pay

## 2020-09-18 DIAGNOSIS — R599 Enlarged lymph nodes, unspecified: Secondary | ICD-10-CM | POA: Diagnosis not present

## 2020-09-18 DIAGNOSIS — N2889 Other specified disorders of kidney and ureter: Secondary | ICD-10-CM

## 2020-09-18 MED ORDER — IOHEXOL 300 MG/ML  SOLN
75.0000 mL | Freq: Once | INTRAMUSCULAR | Status: AC | PRN
  Administered 2020-09-18: 75 mL via INTRAVENOUS

## 2020-09-26 ENCOUNTER — Other Ambulatory Visit: Payer: Self-pay

## 2020-09-26 ENCOUNTER — Ambulatory Visit (HOSPITAL_BASED_OUTPATIENT_CLINIC_OR_DEPARTMENT_OTHER)
Admission: RE | Admit: 2020-09-26 | Discharge: 2020-09-26 | Disposition: A | Discharge status: Home / Self Care | Payer: Medicare Other | Source: Ambulatory Visit | Attending: Hematology & Oncology | Admitting: Hematology & Oncology

## 2020-09-26 DIAGNOSIS — N2889 Other specified disorders of kidney and ureter: Secondary | ICD-10-CM | POA: Diagnosis present

## 2020-09-26 MED ORDER — GADOBUTROL 1 MMOL/ML IV SOLN
4.4000 mL | Freq: Once | INTRAVENOUS | Status: AC | PRN
  Administered 2020-09-26: 4.4 mL via INTRAVENOUS

## 2020-09-29 ENCOUNTER — Inpatient Hospital Stay: Payer: Medicare Other

## 2020-09-29 ENCOUNTER — Encounter: Payer: Self-pay | Admitting: *Deleted

## 2020-09-29 ENCOUNTER — Inpatient Hospital Stay: Payer: Medicare Other | Admitting: Family

## 2020-09-29 ENCOUNTER — Other Ambulatory Visit: Payer: Self-pay

## 2020-09-29 ENCOUNTER — Inpatient Hospital Stay: Payer: Medicare Other | Attending: Hematology & Oncology

## 2020-09-29 VITALS — BP 155/71 | HR 60 | Temp 97.5°F | Resp 17

## 2020-09-29 DIAGNOSIS — D631 Anemia in chronic kidney disease: Secondary | ICD-10-CM

## 2020-09-29 DIAGNOSIS — R599 Enlarged lymph nodes, unspecified: Secondary | ICD-10-CM

## 2020-09-29 DIAGNOSIS — N183 Chronic kidney disease, stage 3 unspecified: Secondary | ICD-10-CM

## 2020-09-29 DIAGNOSIS — N1831 Chronic kidney disease, stage 3a: Secondary | ICD-10-CM

## 2020-09-29 DIAGNOSIS — N189 Chronic kidney disease, unspecified: Secondary | ICD-10-CM | POA: Insufficient documentation

## 2020-09-29 DIAGNOSIS — Z79899 Other long term (current) drug therapy: Secondary | ICD-10-CM | POA: Diagnosis not present

## 2020-09-29 DIAGNOSIS — D638 Anemia in other chronic diseases classified elsewhere: Secondary | ICD-10-CM

## 2020-09-29 DIAGNOSIS — E1022 Type 1 diabetes mellitus with diabetic chronic kidney disease: Secondary | ICD-10-CM

## 2020-09-29 LAB — CBC WITH DIFFERENTIAL (CANCER CENTER ONLY)
Abs Immature Granulocytes: 0.01 10*3/uL (ref 0.00–0.07)
Basophils Absolute: 0.1 10*3/uL (ref 0.0–0.1)
Basophils Relative: 2 %
Eosinophils Absolute: 0.5 10*3/uL (ref 0.0–0.5)
Eosinophils Relative: 10 %
HCT: 31 % — ABNORMAL LOW (ref 36.0–46.0)
Hemoglobin: 10.1 g/dL — ABNORMAL LOW (ref 12.0–15.0)
Immature Granulocytes: 0 %
Lymphocytes Relative: 16 %
Lymphs Abs: 0.7 10*3/uL (ref 0.7–4.0)
MCH: 32.2 pg (ref 26.0–34.0)
MCHC: 32.6 g/dL (ref 30.0–36.0)
MCV: 98.7 fL (ref 80.0–100.0)
Monocytes Absolute: 0.5 10*3/uL (ref 0.1–1.0)
Monocytes Relative: 10 %
Neutro Abs: 2.7 10*3/uL (ref 1.7–7.7)
Neutrophils Relative %: 62 %
Platelet Count: 264 10*3/uL (ref 150–400)
RBC: 3.14 MIL/uL — ABNORMAL LOW (ref 3.87–5.11)
RDW: 12.9 % (ref 11.5–15.5)
WBC Count: 4.4 10*3/uL (ref 4.0–10.5)
nRBC: 0 % (ref 0.0–0.2)

## 2020-09-29 LAB — CMP (CANCER CENTER ONLY)
ALT: 17 U/L (ref 0–44)
AST: 22 U/L (ref 15–41)
Albumin: 3.6 g/dL (ref 3.5–5.0)
Alkaline Phosphatase: 120 U/L (ref 38–126)
Anion gap: 6 (ref 5–15)
BUN: 32 mg/dL — ABNORMAL HIGH (ref 8–23)
CO2: 25 mmol/L (ref 22–32)
Calcium: 8.7 mg/dL — ABNORMAL LOW (ref 8.9–10.3)
Chloride: 97 mmol/L — ABNORMAL LOW (ref 98–111)
Creatinine: 1.22 mg/dL — ABNORMAL HIGH (ref 0.44–1.00)
GFR, Estimated: 49 mL/min — ABNORMAL LOW (ref >60)
Glucose, Bld: 205 mg/dL — ABNORMAL HIGH (ref 70–99)
Potassium: 4.8 mmol/L (ref 3.5–5.1)
Sodium: 128 mmol/L — ABNORMAL LOW (ref 135–145)
Total Bilirubin: 0.3 mg/dL (ref 0.3–1.2)
Total Protein: 5.8 g/dL — ABNORMAL LOW (ref 6.5–8.1)

## 2020-09-29 LAB — RETICULOCYTES
Immature Retic Fract: 2.5 % (ref 2.3–15.9)
RBC.: 3.16 MIL/uL — ABNORMAL LOW (ref 3.87–5.11)
Retic Count, Absolute: 14.9 10*3/uL — ABNORMAL LOW (ref 19.0–186.0)
Retic Ct Pct: 0.5 % (ref 0.4–3.1)

## 2020-09-29 MED ORDER — DARBEPOETIN ALFA 300 MCG/0.6ML IJ SOSY
300.0000 ug | PREFILLED_SYRINGE | Freq: Once | INTRAMUSCULAR | Status: AC
  Administered 2020-09-29: 300 ug via SUBCUTANEOUS
  Filled 2020-09-29: qty 0.6

## 2020-09-29 NOTE — Patient Instructions (Signed)
Darbepoetin Alfa injection What is this medication? DARBEPOETIN ALFA (dar be POE e tin AL fa) helps your body make more red blood cells. It is used to treat anemia caused by chronic kidney failure and chemotherapy. This medicine may be used for other purposes; ask your health care provider or pharmacist if you have questions. COMMON BRAND NAME(S): Aranesp What should I tell my care team before I take this medication? They need to know if you have any of these conditions: blood clotting disorders or history of blood clots cancer patient not on chemotherapy cystic fibrosis heart disease, such as angina, heart failure, or a history of a heart attack hemoglobin level of 12 g/dL or greater high blood pressure low levels of folate, iron, or vitamin B12 seizures an unusual or allergic reaction to darbepoetin, erythropoietin, albumin, hamster proteins, latex, other medicines, foods, dyes, or preservatives pregnant or trying to get pregnant breast-feeding How should I use this medication? This medicine is for injection into a vein or under the skin. It is usually given by a health care professional in a hospital or clinic setting. If you get this medicine at home, you will be taught how to prepare and give this medicine. Use exactly as directed. Take your medicine at regular intervals. Do not take your medicine more often than directed. It is important that you put your used needles and syringes in a special sharps container. Do not put them in a trash can. If you do not have a sharps container, call your pharmacist or healthcare provider to get one. A special MedGuide will be given to you by the pharmacist with each prescription and refill. Be sure to read this information carefully each time. Talk to your pediatrician regarding the use of this medicine in children. While this medicine may be used in children as young as 1 month of age for selected conditions, precautions do apply. Overdosage: If you  think you have taken too much of this medicine contact a poison control center or emergency room at once. NOTE: This medicine is only for you. Do not share this medicine with others. What if I miss a dose? If you miss a dose, take it as soon as you can. If it is almost time for your next dose, take only that dose. Do not take double or extra doses. What may interact with this medication? Do not take this medicine with any of the following medications: epoetin alfa This list may not describe all possible interactions. Give your health care provider a list of all the medicines, herbs, non-prescription drugs, or dietary supplements you use. Also tell them if you smoke, drink alcohol, or use illegal drugs. Some items may interact with your medicine. What should I watch for while using this medication? Your condition will be monitored carefully while you are receiving this medicine. You may need blood work done while you are taking this medicine. This medicine may cause a decrease in vitamin B6. You should make sure that you get enough vitamin B6 while you are taking this medicine. Discuss the foods you eat and the vitamins you take with your health care professional. What side effects may I notice from receiving this medication? Side effects that you should report to your doctor or health care professional as soon as possible: allergic reactions like skin rash, itching or hives, swelling of the face, lips, or tongue breathing problems changes in vision chest pain confusion, trouble speaking or understanding feeling faint or lightheaded, falls high blood pressure   muscle aches or pains pain, swelling, warmth in the leg rapid weight gain severe headaches sudden numbness or weakness of the face, arm or leg trouble walking, dizziness, loss of balance or coordination seizures (convulsions) swelling of the ankles, feet, hands unusually weak or tired Side effects that usually do not require medical  attention (report to your doctor or health care professional if they continue or are bothersome): diarrhea fever, chills (flu-like symptoms) headaches nausea, vomiting redness, stinging, or swelling at site where injected This list may not describe all possible side effects. Call your doctor for medical advice about side effects. You may report side effects to FDA at 1-800-FDA-1088. Where should I keep my medication? Keep out of the reach of children. Store in a refrigerator between 2 and 8 degrees C (36 and 46 degrees F). Do not freeze. Do not shake. Throw away any unused portion if using a single-dose vial. Throw away any unused medicine after the expiration date. NOTE: This sheet is a summary. It may not cover all possible information. If you have questions about this medicine, talk to your doctor, pharmacist, or health care provider.  2022 Elsevier/Gold Standard (2017-01-25 16:44:20)  

## 2020-09-30 LAB — FERRITIN: Ferritin: 128 ng/mL (ref 11–307)

## 2020-09-30 LAB — IRON AND TIBC
Iron: 105 ug/dL (ref 41–142)
Saturation Ratios: 42 % (ref 21–57)
TIBC: 249 ug/dL (ref 236–444)
UIBC: 143 ug/dL (ref 120–384)

## 2020-10-08 ENCOUNTER — Ambulatory Visit: Payer: Self-pay

## 2020-10-12 ENCOUNTER — Other Ambulatory Visit: Payer: Self-pay

## 2020-10-12 ENCOUNTER — Emergency Department (INDEPENDENT_AMBULATORY_CARE_PROVIDER_SITE_OTHER)
Admission: RE | Admit: 2020-10-12 | Discharge: 2020-10-12 | Disposition: A | Discharge status: Home / Self Care | Payer: Medicare Other | Source: Ambulatory Visit

## 2020-10-12 VITALS — BP 173/89 | HR 64 | Temp 97.8°F | Resp 16

## 2020-10-12 DIAGNOSIS — R300 Dysuria: Secondary | ICD-10-CM

## 2020-10-12 DIAGNOSIS — R350 Frequency of micturition: Secondary | ICD-10-CM | POA: Diagnosis not present

## 2020-10-12 LAB — POCT URINALYSIS DIP (MANUAL ENTRY)
Bilirubin, UA: NEGATIVE
Blood, UA: NEGATIVE
Glucose, UA: NEGATIVE mg/dL
Ketones, POC UA: NEGATIVE mg/dL
Leukocytes, UA: NEGATIVE
Nitrite, UA: NEGATIVE
Protein Ur, POC: >=300 mg/dL — AB
Spec Grav, UA: 1.02 (ref 1.010–1.025)
Urobilinogen, UA: 0.2 E.U./dL
pH, UA: 5.5 (ref 5.0–8.0)

## 2020-10-12 NOTE — ED Triage Notes (Signed)
Pt present urinary frequency with burning sensation  while urinating. Symptom started a week ago.

## 2020-10-12 NOTE — Discharge Instructions (Addendum)
Advised patient Brittney Pace (urinalysis) was unremarkable today with the exception of a high protein.  Advised/instructed patient we will send for urinary culture and follow-up with her once results return in the next 2 days.

## 2020-10-12 NOTE — ED Provider Notes (Signed)
[name]    CSN: [id number] Arrival date & time: 10/12/20  1021      History   Chief Complaint Chief Complaint  Patient presents with   Urinary Frequency    HPI Keiondra Brookover is a 65 y.o. female.   HPI 64 year old female presents with dysuria and urinary frequency for 1 week.  PMH significant for multiple CVAs, OAB, CKD stage III, and CAD.  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   Family history of adverse reaction to anesthesia    mother: " while she was under she stopped breathing."   Fibromyalgia    Gastroparesis    GERD (gastroesophageal reflux disease)    Headache    migraines   Hypothyroidism    IBS (irritable bowel syndrome)    Idiopathic edema    Iron deficiency anemia 09/04/2019   Lyme disease    Mitral valve prolapse    Myocardial infarction ([place])    1 major in 1999 and 2 minor " small vessel disease."   Osteoporosis    Peripheral neuropathy    Peripheral vascular disease ([place])    Sinus disorder    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   Stroke [facility])    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" " the second stroke was a lacunar     Patient Active Problem List   Diagnosis Date Noted   Chronic diarrhea 07/03/2020   S/P ORIF (open reduction internal fixation) fracture 04/30/2020   Chronic migraine without aura, with intractable migraine, so stated, with status migrainosus 04/01/2020   Herpes genitalis 11/07/2019   History of multiple cerebrovascular accidents (CVAs) 10/30/2019   Iron deficiency anemia 09/04/2019   ETD (Eustachian tube dysfunction), bilateral 08/22/2019   History of hyperbaric oxygen therapy 08/22/2019    DNR (do not resuscitate) 12/10/2018   OAB (overactive bladder) 11/20/2018   Recurrent UTI 11/20/2018   Vaginal atrophy 11/20/2018   Voiding dysfunction 11/20/2018   Dizziness 10/22/2018   Ear fullness, bilateral 10/22/2018   Tinnitus, right 10/22/2018   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   Arthritis 02/23/2018   Asthma 02/23/2018   Carpal tunnel syndrome 02/23/2018   Gastroesophageal reflux disease 02/23/2018   Kidney disease 02/23/2018   Seasonal affective disorder ([place]) 02/23/2018   Electrolyte abnormality 02/20/2017   Sepsis secondary to UTI ([place]) 02/15/2017   Sepsis with metabolic encephalopathy ([place]) 02/15/2017   Hypertension associated with diabetes ([place]) 02/14/2017   Encephalopathy acute 12/23/2016   Brain atrophy ([place]) 05/24/2016   Chronic headache 05/24/2016   Postmenopausal osteoporosis 05/03/2016   Neuropathy 04/20/2016   Lacunar stroke ([place]) 01/20/2016   Memory loss 01/20/2016   Other fatigue 01/20/2016   CAD (coronary artery disease) of artery bypass graft 01/20/2016   Depression 01/20/2016   Right lateral epicondylitis 12/16/2015   Babesiosis 12/16/2015   Lyme disease 12/16/2015   Small intestinal bacterial overgrowth 11/19/2015   Hyperlipidemia 09/25/2015   Abdominal bloating 09/04/2015   Closed displaced intra-articular fracture of right calcaneus 06/16/2015   Myopia of both eyes with astigmatism and presbyopia 05/21/2015   Vitreous syneresis of both eyes  05/21/2015   Combined form of age-related cataract, left eye 05/21/2015   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   Pseudophakia, right eye 05/21/2015   Surgery, elective 05/06/2015   CAD (coronary artery disease) 11/19/2014   Diabetes mellitus type I ([place]) 11/19/2014   DDD (degenerative disc disease), cervical 03/11/2014   Cervical spondylosis with radiculopathy 03/11/2014   Spondylolisthesis of cervical region 03/11/2014   Vitreous hemorrhage of left eye ([place])  03/03/2014   DDD (degenerative disc disease), thoracic 01/07/2014   Anemia due to stage 3 chronic kidney disease ([place]) 12/16/2013   Osteoporosis 11/14/2013   Chronic diastolic heart failure ([place]) 09/18/2013   Cataract due to secondary diabetes ([place]) 08/26/2013   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 associated with type 2 diabetes mellitus ([place]) 08/26/2013   Secondary diabetes mellitus with ophthalmic complication ([place]) 69/62/9528   Type 1 diabetes mellitus with hyperosmolar coma ([place]) 08/26/2013   Hypothyroidism due to acquired atrophy of thyroid 08/02/2013   Acquired hypothyroidism 08/02/2013   Cerebral artery occlusion with cerebral infarction ([place]) 07/14/2013   History of diabetic gastroparesis 04/09/2013   Irritable bowel syndrome with diarrhea 04/09/2013   Diarrhea 04/09/2013   History of endocrine, metabolic or immunity disorder 04/09/2013   Anemia of chronic disease 04/08/2013   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 ([place]) 41/32/4401   CKD stage 3 due to type 1 diabetes mellitus ([place]) 04/08/2013   Anemia 04/08/2013    Past Surgical History:  Procedure Laterality Date   ABDOMINAL HYSTERECTOMY     APPENDECTOMY     BREAST SURGERY     B/L biopsy and lumpectomy    CARDIAC CATHETERIZATION     CARPAL TUNNEL RELEASE     CATARACT EXTRACTION W/ INTRAOCULAR LENS IMPLANT     right eye   COLONOSCOPY W/ BIOPSIES AND POLYPECTOMY     CORONARY ARTERY BYPASS GRAFT     coronary artery stents     at [place] and [place]   ECTOPIC PREGNANCY SURGERY     NASAL SEPTUM SURGERY     OPEN REDUCTION INTERNAL FIXATION (ORIF) DISTAL RADIAL FRACTURE Right 05/06/2015   Procedure: OPEN REDUCTION INTERNAL FIXATION (ORIF) RIGHT DISTAL RADIAL FRACTURE AND REPAIRS AS NEEDED;  Surgeon: Iran Planas, MD;  Location: [facility];  Service: Orthopedics;  Laterality: Right;   ORIF HUMERUS FRACTURE Right 04/30/2020   Procedure: OPEN REDUCTION INTERNAL FIXATION (ORIF) PROXIMAL HUMERUS  FRACTURE;  Surgeon: Justice Britain, MD;  Location: [facility];  Service: Orthopedics;  Laterality: Right;  137mn   ORIF WRIST FRACTURE Left 11/05/2014   ORIF WRIST FRACTURE Left 11/05/2014   Procedure: OPEN REDUCTION INTERNAL FIXATION (ORIF) LEFT WRIST FRACTURE AND REPAIR AS INDICATED;  Surgeon: FIran Planas MD;  Location: [facility]  Service: Orthopedics;  Laterality: Left;   TRIGGER FINGER RELEASE      OB History   No obstetric history on file.      Home Medications    Prior to Admission medications   Medication Sig Start Date End Date Taking? Authorizing Provider  AMBULATORY NON FORMULARY MEDICATION Incontinence pads per patient preference, #unlimited, refill [phone] Fax to [phone]/1/20   AEmeterio Reeve DO  Armodafinil 250 MG tablet 1 tab po q day 07/13/20   Saguier,Percell Miller PA-C  ARMOUR THYROID 60 MG tablet Take 1 tablet (60 mg total) by mouth daily before breakfast. 06/10/20   AEmeterio Reeve DO  aspirin EC 325 MG tablet Take 1 tablet (325 mg total) by mouth daily. Patient taking differently: Take  325 mg by mouth at bedtime. 03/06/19   Lelon Perla, MD  carvedilol (COREG) 12.5 MG tablet TAKE 1 TABLET BY MOUTH TWICE DAILY WITH MEALS 06/23/20   Lelon Perla, MD  Continuous Blood Gluc Sensor (DEXCOM G6 SENSOR) MISC USE TO CONTINUOUSLY MONITOR BLOOD SUGARS. CHANGE EVERY 10 DAYS 04/02/19   [provider]  cyclobenzaprine (FLEXERIL) 10 MG tablet Take 0.5-1 tablets (5-10 mg total) by mouth 3 (three) times daily as needed for muscle spasms. Caution: can cause drowsiness 05/01/20   Shuford, Olivia Mackie, PA-C  diclofenac Sodium (VOLTAREN) 1 % GEL Apply 2 g topically daily as needed (Pain).    [provider]  diphenhydrAMINE (BENADRYL) 25 MG tablet Take 25 mg by mouth at bedtime.    [provider]  diphenoxylate-atropine (LOMOTIL) 2.5-0.025 MG tablet TAKE 1 TABLET BY MOUTH 4 TIMES DAILY AS NEEDED FOR  DIARRHEA  OR  LOOSE  STOOLS Patient taking differently: Take  1 tablet by mouth daily as needed for diarrhea or loose stools. 01/14/20   [name], MD  esomeprazole (NEXIUM) 20 MG capsule Take 20 mg by mouth every morning.     [provider]  fluocinonide (LIDEX) 0.05 % external solution Apply 1 application topically See admin instructions. Once every three days    [provider]  gabapentin (NEURONTIN) 100 MG capsule Take 1-2 capsules (100-200 mg total) by mouth 3 (three) times daily as needed. Patient taking differently: Take 100-200 mg by mouth 2 (two) times daily. Pain and headache 10/04/19   Samuel Bouche, NP  Galcanezumab-gnlm (EMGALITY) 120 MG/ML SOAJ Inject 120 mg into the skin every 30 (thirty) days. Patient not taking: Reported on 07/22/2020 05/14/20   Melvenia Beam, MD  Insulin Human (INSULIN PUMP) SOLN Inject into the skin continuous. Novolog insulin    [provider]  ketoconazole (NIZORAL) 2 % shampoo APPLY 1 APPLICATION TOPICALLY 2 (TWO) TIMES A WEEK. 08/17/20   Saguier, Percell Miller, PA-C  lidocaine-prilocaine (EMLA) cream Apply 1 application topically every 2 (two) hours as needed. Please run with good Rx discount coupon 11/08/19   Emeterio Reeve, DO  Melatonin 5 MG CHEW Chew 5 mg by mouth daily as needed (Sleep).    [provider]  nitroGLYCERIN (NITROLINGUAL) 0.4 MG/SPRAY spray Place 1 spray under the tongue every 5 (five) minutes x 3 doses as needed for chest pain. 03/06/19   Lelon Perla, MD  NOVOLOG 100 UNIT/ML injection Inject 40 Units into the skin daily. 06/08/20   [provider]  ondansetron (ZOFRAN) 4 MG tablet Take 1 tablet (4 mg total) by mouth every 8 (eight) hours as needed for nausea or vomiting. 05/01/20   Shuford, Olivia Mackie, PA-C  OVER THE COUNTER MEDICATION as directed. Magnesium L-Treonate    [provider]  oxycodone (OXY-IR) 5 MG capsule Take 1 capsule (5 mg total) by mouth every 4 (four) hours as needed. Patient not taking: Reported on 07/22/2020 05/01/20   Shuford,  Olivia Mackie, PA-C  Pancrelipase, Lip-Prot-Amyl, (CREON) 3000-9500 units CPEP Take 4 capsules by mouth 3 (three) times daily. 06/25/20   [provider]  promethazine (PHENERGAN) 25 MG tablet Take 1 tablet (25 mg total) by mouth every 6 (six) hours as needed for nausea or vomiting. 05/01/20   Shuford, Olivia Mackie, PA-C  Sod Picosulfate-Mag Ox-Cit Acd (CLENPIQ) 10-3.5-12 MG-GM -GM/160ML SOLN Take 1 kit by mouth as directed. 07/03/20   Zehr, Laban Emperor, PA-C  spironolactone (ALDACTONE) 25 MG tablet Take 50 mg by mouth daily. 05/09/19  [provider]  torsemide ([name]) 20 MG tablet TAKE 1 TABLET ONCE DAILY INTHE MORNING. MAY TAKE AN   EXTRA 1/2 TABLET AS        NEEDED Patient taking differently: Take 20 mg by mouth daily as needed (Water retention). 01/15/18   Trixie Dredge, PA-C  valACYclovir (VALTREX) 500 MG tablet Take 500 mg by mouth daily as needed (Flair up).    [provider]    Family History Family History  Problem Relation Age of Onset   Breast cancer Mother    Migraines Mother    Heart disease Father    Hypertension Father    Breast cancer Sister     Social History Social History   Tobacco Use   Smoking status: Former    Types: Cigarettes   Smokeless tobacco: Never   Tobacco comments:     " Quit smoking cigarettes in 20's "  Vaping Use   Vaping Use: Never used  Substance Use Topics   Alcohol use: Yes    Comment: occasional beer or wine   Drug use: No     Allergies   Morphine, Morphine and related, Penicillins, Tramadol, Tramadol-acetaminophen, Acetaminophen, Amitriptyline, Codeine, Ibuprofen, Losartan, Propoxyphene, Shellfish allergy, Statins, Sulfamethoxazole, Trichophyton, Krill oil, Norvasc [amlodipine besylate], and Fluconazole   Review of Systems Review of Systems  Genitourinary:  Positive for dysuria and urgency.  All other systems reviewed and are negative.   Physical Exam Triage Vital Signs ED Triage Vitals  Enc Vitals Group      BP 10/12/20 1043 (!) 173/89     Pulse Rate 10/12/20 1043 64     Resp 10/12/20 1043 16     Temp 10/12/20 1043 97.8 F (36.6 C)     Temp Source 10/12/20 1043 Oral     SpO2 10/12/20 1043 100 %     Weight --      Height --      Head Circumference --      Peak Flow --      Pain Score 10/12/20 1044 6     Pain Loc --      Pain Edu? --      Excl. in [place]? --    No data found.  Updated Vital Signs BP (!) 173/89 (BP Location: Right Arm)   Pulse 64   Temp 97.8 F (36.6 C) (Oral)   Resp 16   SpO2 100%    Physical Exam Vitals and nursing note reviewed.  Constitutional:      General: She is not in acute distress.    Appearance: Normal appearance. She is normal weight. She is not ill-appearing.  HENT:     Head: Normocephalic and atraumatic.     Mouth/Throat:     Mouth: Mucous membranes are moist.     Pharynx: Oropharynx is clear.  Eyes:     Extraocular Movements: Extraocular movements intact.     Conjunctiva/sclera: Conjunctivae normal.     Pupils: Pupils are equal, round, and reactive to light.  Cardiovascular:     Rate and Rhythm: Normal rate and regular rhythm.     Pulses: Normal pulses.     Heart sounds: Normal heart sounds.  Pulmonary:     Effort: Pulmonary effort is normal.     Breath sounds: Normal breath sounds. No wheezing, rhonchi or rales.  Abdominal:     Tenderness: There is no right CVA tenderness or left CVA tenderness.  Musculoskeletal:        General: Normal range of  motion.     Cervical back: Normal range of motion and neck supple.  Skin:    General: Skin is warm and dry.  Neurological:     General: No focal deficit present.     Mental Status: She is alert and oriented to person, place, and time. Mental status is at baseline.  Psychiatric:        Mood and Affect: Mood normal.        Behavior: Behavior normal.        Thought Content: Thought content normal.     UC Treatments / Results  Labs (all labs ordered are listed, but only abnormal results  are displayed) Labs Reviewed  POCT URINALYSIS DIP (MANUAL ENTRY) - Abnormal; Notable for the following components:      Result Value   Protein Ur, POC >=300 (*)    All other components within normal limits    EKG   Radiology No results found.  Procedures Procedures (including critical care time)  Medications Ordered in UC Medications - No data to display  Initial Impression / Assessment and Plan / UC Course  I have reviewed the triage vital signs and the nursing notes.  Pertinent labs & imaging results that were available during my care of the patient were reviewed by me and considered in my medical decision making (see chart for details).     MDM: 1.  Dysuria-Advised patient UA (urinalysis) was unremarkable today with the exception of a high protein.  Advised/instructed patient we will send for urinary culture and follow-up with her once results return in the next 2 days.  Patient discharged home, hemodynamically stable. Final Clinical Impressions(s) / UC Diagnoses   Final diagnoses:  Dysuria     Discharge Instructions      Advised patient UA (urinalysis) was unremarkable today with the exception of a high protein.  Advised/instructed patient we will send for urinary culture and follow-up with her once results return in the next 2 days.     ED Prescriptions   None    PDMP not reviewed this encounter.   Eliezer Lofts, [place] 10/12/20 1158

## 2020-10-13 LAB — URINE CULTURE
MICRO NUMBER:: 12391408
Result:: NO GROWTH
SPECIMEN QUALITY:: ADEQUATE

## 2020-10-22 ENCOUNTER — Inpatient Hospital Stay: Payer: Medicare Other

## 2020-10-22 ENCOUNTER — Encounter: Payer: Self-pay | Admitting: Hematology & Oncology

## 2020-10-22 ENCOUNTER — Other Ambulatory Visit: Payer: Self-pay

## 2020-10-22 ENCOUNTER — Inpatient Hospital Stay (HOSPITAL_BASED_OUTPATIENT_CLINIC_OR_DEPARTMENT_OTHER): Payer: Medicare Other | Admitting: Hematology & Oncology

## 2020-10-22 VITALS — BP 156/54 | HR 59 | Temp 97.7°F | Resp 18 | Wt 98.0 lb

## 2020-10-22 DIAGNOSIS — N183 Chronic kidney disease, stage 3 unspecified: Secondary | ICD-10-CM

## 2020-10-22 DIAGNOSIS — N189 Chronic kidney disease, unspecified: Secondary | ICD-10-CM | POA: Diagnosis not present

## 2020-10-22 DIAGNOSIS — D638 Anemia in other chronic diseases classified elsewhere: Secondary | ICD-10-CM

## 2020-10-22 DIAGNOSIS — N186 End stage renal disease: Secondary | ICD-10-CM

## 2020-10-22 DIAGNOSIS — D631 Anemia in chronic kidney disease: Secondary | ICD-10-CM

## 2020-10-22 DIAGNOSIS — E1022 Type 1 diabetes mellitus with diabetic chronic kidney disease: Secondary | ICD-10-CM | POA: Diagnosis not present

## 2020-10-22 DIAGNOSIS — D500 Iron deficiency anemia secondary to blood loss (chronic): Secondary | ICD-10-CM | POA: Diagnosis not present

## 2020-10-22 DIAGNOSIS — Z992 Dependence on renal dialysis: Secondary | ICD-10-CM

## 2020-10-22 LAB — CBC WITH DIFFERENTIAL (CANCER CENTER ONLY)
Abs Immature Granulocytes: 0.01 10*3/uL (ref 0.00–0.07)
Basophils Absolute: 0.1 10*3/uL (ref 0.0–0.1)
Basophils Relative: 1 %
Eosinophils Absolute: 0.3 10*3/uL (ref 0.0–0.5)
Eosinophils Relative: 7 %
HCT: 33.6 % — ABNORMAL LOW (ref 36.0–46.0)
Hemoglobin: 10.9 g/dL — ABNORMAL LOW (ref 12.0–15.0)
Immature Granulocytes: 0 %
Lymphocytes Relative: 24 %
Lymphs Abs: 1.2 10*3/uL (ref 0.7–4.0)
MCH: 31.9 pg (ref 26.0–34.0)
MCHC: 32.4 g/dL (ref 30.0–36.0)
MCV: 98.2 fL (ref 80.0–100.0)
Monocytes Absolute: 0.5 10*3/uL (ref 0.1–1.0)
Monocytes Relative: 9 %
Neutro Abs: 3.1 10*3/uL (ref 1.7–7.7)
Neutrophils Relative %: 59 %
Platelet Count: 238 10*3/uL (ref 150–400)
RBC: 3.42 MIL/uL — ABNORMAL LOW (ref 3.87–5.11)
RDW: 13.6 % (ref 11.5–15.5)
WBC Count: 5.2 10*3/uL (ref 4.0–10.5)
nRBC: 0 % (ref 0.0–0.2)

## 2020-10-22 LAB — CMP (CANCER CENTER ONLY)
ALT: 12 U/L (ref 0–44)
AST: 17 U/L (ref 15–41)
Albumin: 3.5 g/dL (ref 3.5–5.0)
Alkaline Phosphatase: 130 U/L — ABNORMAL HIGH (ref 38–126)
Anion gap: 7 (ref 5–15)
BUN: 28 mg/dL — ABNORMAL HIGH (ref 8–23)
CO2: 25 mmol/L (ref 22–32)
Calcium: 8.7 mg/dL — ABNORMAL LOW (ref 8.9–10.3)
Chloride: 100 mmol/L (ref 98–111)
Creatinine: 1.26 mg/dL — ABNORMAL HIGH (ref 0.44–1.00)
GFR, Estimated: 47 mL/min — ABNORMAL LOW (ref >60)
Glucose, Bld: 187 mg/dL — ABNORMAL HIGH (ref 70–99)
Potassium: 5 mmol/L (ref 3.5–5.1)
Sodium: 132 mmol/L — ABNORMAL LOW (ref 135–145)
Total Bilirubin: 0.3 mg/dL (ref 0.3–1.2)
Total Protein: 5.7 g/dL — ABNORMAL LOW (ref 6.5–8.1)

## 2020-10-22 MED ORDER — DARBEPOETIN ALFA 300 MCG/0.6ML IJ SOSY
300.0000 ug | PREFILLED_SYRINGE | Freq: Once | INTRAMUSCULAR | Status: AC
  Administered 2020-10-22: 300 ug via SUBCUTANEOUS
  Filled 2020-10-22: qty 0.6

## 2020-10-22 NOTE — Progress Notes (Signed)
Hematology and Oncology Follow Up Visit  Brittney Pace [id number] 1955-07-01 65 y.o. 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 Sachse is here today for follow-up.  She is still having some problems.  She still some issues with Candida.  She is going to see [facility].  She does not see them until November.  Her diabetes is still a problem.  She does have the insulin pump.  She does have a bony pain.  She is hoping that she will be able to get a doctor who will be able to manage a lot of her other issues.  She has had no problems with fever.  There has been no obvious change in bowel or bladder habits.  I think she may have some episodes of diarrhea.  There has been no issues with cough.  Thankfully, she has had no problems with COVID.  She has had a little bit of a rash.  She says it is from Candida dying.  She cannot take Diflucan because of the issues with Candida dying often causing an immune reaction.  Currently, I would say her performance status is probably ECOG 1.  I did a CT scan on her back in early September.  The CT scan did not show any adenopathy that was suggestive of lymphoma in the abdomen or pelvis.  We did do an MRI.  This showed no growth and a cystic lesion in the right kidney.  The lesion measures 2 x 2.1 cm.  It was recommended that the MRI be repeated in a year.  Overall, I would say her performance status is probably ECOG 2.    Medications:  Allergies as of 10/22/2020       Reactions   Morphine Anaphylaxis, Shortness Of Breath   Morphine And Related Shortness Of Breath   Penicillins Hives   Tolerated ANCEF on 04/30/20 Has patient had a PCN reaction causing immediate rash, facial/tongue/throat swelling, SOB or lightheadedness with hypotension:  Yes Has patient had a PCN reaction causing severe rash involving mucus membranes or skin necrosis: No Has patient had a PCN reaction that required hospitalization No Has patient had a PCN reaction occurring within the last 10 years: No If all of the above answers are "NO", then may proceed with Cephalosporin use.   Tramadol Anaphylaxis   Tramadol-acetaminophen Other (See Comments)   Small vessel heart attack Other reaction(s): Cardiovascular Arrest (ALLERGY/intolerance), Other (See Comments)   Acetaminophen Other (See Comments)   Alters insulin pump readings   Amitriptyline Other (See Comments)   Severe headache/ out of body feeling   Codeine Other (See Comments)   Severe headaches/ out of body feeling   Ibuprofen Other (See Comments)   Messes up CGM reading on glucose monitor   Losartan Cough   Propoxyphene Other (See Comments)   Severe headaches / out of body feeling   Shellfish Allergy Diarrhea, Nausea And Vomiting   Statins Other (See Comments)   Muscle pains Other reaction(s): Other   Sulfamethoxazole Other (See Comments)   Mouth ulcers   Trichophyton Itching, Other (See Comments)   (fungus) sinusitis   Krill Oil Diarrhea, Itching, Nausea And Vomiting   Norvasc [amlodipine Besylate] Swelling   Fluconazole    Other reaction(s): Contact Dermatitis (intolerance)  Medication List        Accurate as of October 22, 2020  2:09 PM. If you have any questions, ask your nurse or doctor.          AMBULATORY NON FORMULARY MEDICATION Incontinence pads per patient preference, #unlimited, refill x99 Fax to [phone]   Armodafinil 250 MG tablet 1 tab po q day   Armour Thyroid 60 MG tablet Generic drug: thyroid Take 1 tablet (60 mg total) by mouth daily before breakfast.   aspirin EC 325 MG tablet Take 1 tablet (325 mg total) by mouth daily. What changed: when to take this   carvedilol 12.5 MG tablet Commonly known as: COREG TAKE 1 TABLET BY MOUTH TWICE  DAILY WITH MEALS   Clenpiq 10-3.5-12 MG-GM -GM/160ML Soln Generic drug: Sod Picosulfate-Mag Ox-Cit Acd Take 1 kit by mouth as directed.   Creon 3000-9500 units Cpep Generic drug: Pancrelipase (Lip-Prot-Amyl) Take 4 capsules by mouth 3 (three) times daily.   cyclobenzaprine 10 MG tablet Commonly known as: FLEXERIL Take 0.5-1 tablets (5-10 mg total) by mouth 3 (three) times daily as needed for muscle spasms. Caution: can cause drowsiness   Dexcom G6 Sensor Misc USE TO CONTINUOUSLY MONITOR BLOOD SUGARS. CHANGE EVERY 10 DAYS   diclofenac Sodium 1 % Gel Commonly known as: VOLTAREN Apply 2 g topically daily as needed (Pain).   diphenhydrAMINE 25 MG tablet Commonly known as: BENADRYL Take 25 mg by mouth at bedtime.   diphenoxylate-atropine 2.5-0.025 MG tablet Commonly known as: LOMOTIL TAKE 1 TABLET BY MOUTH 4 TIMES DAILY AS NEEDED FOR  DIARRHEA  OR  LOOSE  STOOLS What changed: See the new instructions.   Emgality 120 MG/ML Soaj Generic drug: Galcanezumab-gnlm Inject 120 mg into the skin every 30 (thirty) days.   esomeprazole 20 MG capsule Commonly known as: NEXIUM Take 20 mg by mouth every morning.   fluocinonide 0.05 % external solution Commonly known as: LIDEX Apply 1 application topically See admin instructions. Once every three days   gabapentin 100 MG capsule Commonly known as: NEURONTIN Take 1-2 capsules (100-200 mg total) by mouth 3 (three) times daily as needed. What changed:  when to take this additional instructions   insulin pump Soln Inject into the skin continuous. Novolog insulin   ketoconazole 2 % shampoo Commonly known as: NIZORAL APPLY 1 APPLICATION TOPICALLY 2 (TWO) TIMES A WEEK.   lidocaine-prilocaine cream Commonly known as: EMLA Apply 1 application topically every 2 (two) hours as needed. Please run with good Rx discount coupon   Melatonin 5 MG Chew Chew 5 mg by mouth daily as needed (Sleep).   nitroGLYCERIN 0.4 MG/SPRAY spray Commonly  known as: NITROLINGUAL Place 1 spray under the tongue every 5 (five) minutes x 3 doses as needed for chest pain.   NovoLOG 100 UNIT/ML injection Generic drug: insulin aspart Inject 40 Units into the skin daily.   ondansetron 4 MG tablet Commonly known as: Zofran Take 1 tablet (4 mg total) by mouth every 8 (eight) hours as needed for nausea or vomiting.   OVER THE COUNTER MEDICATION as directed. Magnesium L-Treonate   oxycodone 5 MG capsule Commonly known as: OXY-IR Take 1 capsule (5 mg total) by mouth every 4 (four) hours as needed.   promethazine 25 MG tablet Commonly known as: PHENERGAN Take 1 tablet (25 mg total) by mouth every 6 (six) hours as needed for nausea or vomiting.   spironolactone 25 MG tablet Commonly known as: ALDACTONE Take 50 mg by mouth daily.   torsemide  20 MG tablet Commonly known as: [name] TAKE 1 TABLET ONCE DAILY INTHE MORNING. MAY TAKE AN   EXTRA 1/2 TABLET AS        NEEDED What changed: See the new instructions.   valACYclovir 500 MG tablet Commonly known as: VALTREX Take 500 mg by mouth daily as needed (Flair up).        Allergies:  Allergies  Allergen Reactions   Morphine Anaphylaxis and Shortness Of Breath   Morphine And Related Shortness Of Breath   Penicillins Hives    Tolerated ANCEF on 04/30/20 Has patient had a PCN reaction causing immediate rash, facial/tongue/throat swelling, SOB or lightheadedness with hypotension: Yes Has patient had a PCN reaction causing severe rash involving mucus membranes or skin necrosis: No Has patient had a PCN reaction that required hospitalization No Has patient had a PCN reaction occurring within the last 10 years: No If all of the above answers are "NO", then may proceed with Cephalosporin use.   Tramadol Anaphylaxis   Tramadol-Acetaminophen Other (See Comments)    Small vessel heart attack Other reaction(s): Cardiovascular Arrest (ALLERGY/intolerance), Other (See Comments)    Acetaminophen Other  (See Comments)    Alters insulin pump readings    Amitriptyline Other (See Comments)    Severe headache/ out of body feeling   Codeine Other (See Comments)    Severe headaches/ out of body feeling    Ibuprofen Other (See Comments)    Messes up CGM reading on glucose monitor     Losartan Cough   Propoxyphene Other (See Comments)    Severe headaches / out of body feeling    Shellfish Allergy Diarrhea and Nausea And Vomiting   Statins Other (See Comments)    Muscle pains Other reaction(s): Other   Sulfamethoxazole Other (See Comments)    Mouth ulcers    Trichophyton Itching and Other (See Comments)    (fungus) sinusitis    Krill Oil Diarrhea, Itching and Nausea And Vomiting   Norvasc [Amlodipine Besylate] Swelling   Fluconazole     Other reaction(s): Contact Dermatitis (intolerance)    Past Medical History, Surgical history, Social history, and Family History were reviewed and updated.  Review of Systems: Review of Systems  Constitutional:  Positive for malaise/fatigue.  HENT: Negative.    Eyes: Negative.   Respiratory: Negative.    Cardiovascular:  Positive for leg swelling.  Gastrointestinal:  Positive for abdominal pain.  Genitourinary: Negative.   Musculoskeletal:  Positive for joint pain and myalgias.  Skin:  Positive for rash.  Neurological: Negative.   Endo/Heme/Allergies: Negative.   Psychiatric/Behavioral: Negative.      Physical Exam:  weight is 98 lb (44.5 kg). Her oral temperature is 97.7 F (36.5 C). Her blood pressure is 156/54 (abnormal) and her pulse is 59 (abnormal). Her respiration is 18 and oxygen saturation is 100%.   Wt Readings from Last 3 Encounters:  10/22/20 98 lb (44.5 kg)  09/08/20 97 lb (44 kg)  07/22/20 94 lb (42.6 kg)    Physical Exam Vitals reviewed.  HENT:     Head: Normocephalic and atraumatic.  Eyes:     Pupils: Pupils are equal, round, and reactive to light.  Cardiovascular:     Rate and Rhythm: Normal rate and  regular rhythm.     Heart sounds: Normal heart sounds.  Pulmonary:     Effort: Pulmonary effort is normal.     Breath sounds: Normal breath sounds.  Abdominal:     General: Bowel sounds are normal.  Palpations: Abdomen is soft.  Musculoskeletal:        General: No tenderness or deformity. Normal range of motion.     Cervical back: Normal range of motion.     Comments: On her extremities, there is some edema in her legs.  She has about 2+ edema.  She has a macular type reddish rash in the lower extremities.  This is in the areas above her ankles.  There is a couple areas that might be a little bit open.  Lymphadenopathy:     Cervical: No cervical adenopathy.  Skin:    General: Skin is warm and dry.     Findings: No erythema or rash.  Neurological:     Mental Status: She is alert and oriented to person, place, and time.  Psychiatric:        Behavior: Behavior normal.        Thought Content: Thought content normal.        Judgment: Judgment normal.    Lab Results  Component Value Date   WBC 5.2 10/22/2020   HGB 10.9 (L) 10/22/2020   HCT 33.6 (L) 10/22/2020   MCV 98.2 10/22/2020   PLT 238 10/22/2020   Lab Results  Component Value Date   FERRITIN 128 09/29/2020   IRON 105 09/29/2020   TIBC 249 09/29/2020   UIBC 143 09/29/2020   IRONPCTSAT 42 09/29/2020   Lab Results  Component Value Date   RETICCTPCT 0.5 09/29/2020   RBC 3.42 (L) 10/22/2020   No results found for: KPAFRELGTCHN, LAMBDASER, KAPLAMBRATIO No results found for: IGGSERUM, IGA, IGMSERUM No results found for: Ronnald Ramp, A1GS, A2GS, Violet Baldy, MSPIKE, SPEI   Chemistry      Component Value Date/Time   NA 132 (L) 10/22/2020 1301   NA 138 07/17/2017 1128   K 5.0 10/22/2020 1301   CL 100 10/22/2020 1301   CO2 25 10/22/2020 1301   BUN 28 (H) 10/22/2020 1301   BUN 23 07/17/2017 1128   CREATININE 1.26 (H) 10/22/2020 1301   CREATININE 0.99 07/20/2018 0821      Component Value  Date/Time   CALCIUM 8.7 (L) 10/22/2020 1301   ALKPHOS 130 (H) 10/22/2020 1301   AST 17 10/22/2020 1301   ALT 12 10/22/2020 1301   BILITOT 0.3 10/22/2020 1301       Impression and Plan: Ms. Barillas is a very pleasant 65 yo female with multifactorial anemia.  I am happy that her anemia is not a problem today.  This continues to improve.  We will give her a dose of Aranesp today.  I am sure that her iron studies should be okay.  Her last iron studies a month ago showed a ferritin of 128 with iron saturation of 42%.  I would like to see her back before Thanksgiving.  Hopefully, we can then try to address issues and try to get her through the holiday season.  She says that she will let me know how everything goes with her doctor at [facility].   [name], MD 9/29/20222:09 PM

## 2020-10-22 NOTE — Patient Instructions (Signed)
Darbepoetin Alfa injection What is this medication? DARBEPOETIN ALFA (dar be POE e tin AL fa) helps your body make more red blood cells. It is used to treat anemia caused by chronic kidney failure and chemotherapy. This medicine may be used for other purposes; ask your health care provider or pharmacist if you have questions. COMMON BRAND NAME(S): Aranesp What should I tell my care team before I take this medication? They need to know if you have any of these conditions: blood clotting disorders or history of blood clots cancer patient not on chemotherapy cystic fibrosis heart disease, such as angina, heart failure, or a history of a heart attack hemoglobin level of 12 g/dL or greater high blood pressure low levels of folate, iron, or vitamin B12 seizures an unusual or allergic reaction to darbepoetin, erythropoietin, albumin, hamster proteins, latex, other medicines, foods, dyes, or preservatives pregnant or trying to get pregnant breast-feeding How should I use this medication? This medicine is for injection into a vein or under the skin. It is usually given by a health care professional in a hospital or clinic setting. If you get this medicine at home, you will be taught how to prepare and give this medicine. Use exactly as directed. Take your medicine at regular intervals. Do not take your medicine more often than directed. It is important that you put your used needles and syringes in a special sharps container. Do not put them in a trash can. If you do not have a sharps container, call your pharmacist or healthcare provider to get one. A special MedGuide will be given to you by the pharmacist with each prescription and refill. Be sure to read this information carefully each time. Talk to your pediatrician regarding the use of this medicine in children. While this medicine may be used in children as young as 1 month of age for selected conditions, precautions do apply. Overdosage: If you  think you have taken too much of this medicine contact a poison control center or emergency room at once. NOTE: This medicine is only for you. Do not share this medicine with others. What if I miss a dose? If you miss a dose, take it as soon as you can. If it is almost time for your next dose, take only that dose. Do not take double or extra doses. What may interact with this medication? Do not take this medicine with any of the following medications: epoetin alfa This list may not describe all possible interactions. Give your health care provider a list of all the medicines, herbs, non-prescription drugs, or dietary supplements you use. Also tell them if you smoke, drink alcohol, or use illegal drugs. Some items may interact with your medicine. What should I watch for while using this medication? Your condition will be monitored carefully while you are receiving this medicine. You may need blood work done while you are taking this medicine. This medicine may cause a decrease in vitamin B6. You should make sure that you get enough vitamin B6 while you are taking this medicine. Discuss the foods you eat and the vitamins you take with your health care professional. What side effects may I notice from receiving this medication? Side effects that you should report to your doctor or health care professional as soon as possible: allergic reactions like skin rash, itching or hives, swelling of the face, lips, or tongue breathing problems changes in vision chest pain confusion, trouble speaking or understanding feeling faint or lightheaded, falls high blood pressure   muscle aches or pains pain, swelling, warmth in the leg rapid weight gain severe headaches sudden numbness or weakness of the face, arm or leg trouble walking, dizziness, loss of balance or coordination seizures (convulsions) swelling of the ankles, feet, hands unusually weak or tired Side effects that usually do not require medical  attention (report to your doctor or health care professional if they continue or are bothersome): diarrhea fever, chills (flu-like symptoms) headaches nausea, vomiting redness, stinging, or swelling at site where injected This list may not describe all possible side effects. Call your doctor for medical advice about side effects. You may report side effects to FDA at 1-800-FDA-1088. Where should I keep my medication? Keep out of the reach of children. Store in a refrigerator between 2 and 8 degrees C (36 and 46 degrees F). Do not freeze. Do not shake. Throw away any unused portion if using a single-dose vial. Throw away any unused medicine after the expiration date. NOTE: This sheet is a summary. It may not cover all possible information. If you have questions about this medicine, talk to your doctor, pharmacist, or health care provider.  2022 Elsevier/Gold Standard (2017-01-25 16:44:20)  

## 2020-10-29 ENCOUNTER — Other Ambulatory Visit: Payer: Self-pay | Admitting: Medical

## 2020-11-02 ENCOUNTER — Telehealth: Payer: Self-pay | Admitting: Pharmacist

## 2020-11-02 NOTE — Telephone Encounter (Signed)
Pt previously seen in lipid clinic, intolerant to atorvastatin, rosuvastatin, simvastatin, pravastatin, and Praluent. LDL 116 at last check in 2020, plans to have this rechecked when she sees Dr Stanford Breed next. LDL goal < 55 due to recurrent ASCVD/strokes and CKD. She is interested in trying Leqvio if affordable. Will mail enrollment form to her to sign and follow up once copay information is known.

## 2020-11-02 NOTE — Telephone Encounter (Signed)
Mailed pt assistance to [place] [phone] as instructed and will keep an eye out

## 2020-11-05 ENCOUNTER — Telehealth: Payer: Self-pay | Admitting: Cardiology

## 2020-11-05 NOTE — Telephone Encounter (Signed)
*  STAT* If patient is at the pharmacy, call can be transferred to refill team.   1. Which medications need to be refilled? (please list name of each medication and dose if known) carvedilol (COREG) 12.5 MG tablet  2. Which pharmacy/location (including street and city if local pharmacy) is medication to be sent to? [facility] [address] [place], [place]  3. Do they need a 30 day or 90 day supply? [place]

## 2020-11-05 NOTE — Progress Notes (Deleted)
HPI: FU CAD. Patient previously followed at [facility]; H/O CABG (LIMA to LAD; SVG to RCA); According to the patient, she had 2 stents to the LIMA graft and the LAD in 2000. She's had EECP treatments in [place]. Her last heart catheterization was August 2015 at [facility] which revealed two-vessel coronary disease with a patent LIMA to the LAD and SVG to the RCA. No obstructive disease in left circumflex; EF 65. Last echo January 2020 showed normal LV function, systolic bowing of mitral valve without frank prolapse.  Also with history of stroke. Since last seen,   Current Outpatient Medications  Medication Sig Dispense Refill   AMBULATORY NON FORMULARY MEDICATION Incontinence pads per patient preference, #unlimited, refill x99 Fax to [phone] Units prn   Armodafinil 250 MG tablet 1 tab po q day 30 tablet 1   ARMOUR THYROID 60 MG tablet Take 1 tablet (60 mg total) by mouth daily before breakfast. 90 tablet PRN   aspirin EC 325 MG tablet Take 1 tablet (325 mg total) by mouth daily. (Patient taking differently: Take 325 mg by mouth at bedtime.) 30 tablet 0   carvedilol (COREG) 12.5 MG tablet TAKE 1 TABLET BY MOUTH TWICE DAILY WITH MEALS 60 tablet 0   Continuous Blood Gluc Sensor (DEXCOM G6 SENSOR) MISC USE TO CONTINUOUSLY MONITOR BLOOD SUGARS. CHANGE EVERY 10 DAYS     cyclobenzaprine (FLEXERIL) 10 MG tablet Take 0.5-1 tablets (5-10 mg total) by mouth 3 (three) times daily as needed for muscle spasms. Caution: can cause drowsiness 40 tablet 1   diclofenac Sodium (VOLTAREN) 1 % GEL Apply 2 g topically daily as needed (Pain).     diphenhydrAMINE (BENADRYL) 25 MG tablet Take 25 mg by mouth at bedtime.     diphenoxylate-atropine (LOMOTIL) 2.5-0.025 MG tablet TAKE 1 TABLET BY MOUTH 4 TIMES DAILY AS NEEDED FOR  DIARRHEA  OR  LOOSE  STOOLS (Patient taking differently: Take 1 tablet by mouth daily as needed for diarrhea or loose stools.) 90 tablet 0   esomeprazole  (NEXIUM) 20 MG capsule Take 20 mg by mouth every morning.      fluocinonide (LIDEX) 0.05 % external solution Apply 1 application topically See admin instructions. Once every three days     gabapentin (NEURONTIN) 100 MG capsule Take 1-2 capsules (100-200 mg total) by mouth 3 (three) times daily as needed. (Patient taking differently: Take 100-200 mg by mouth 2 (two) times daily. Pain and headache) 180 capsule 5   Galcanezumab-gnlm (EMGALITY) 120 MG/ML SOAJ Inject 120 mg into the skin every 30 (thirty) days. (Patient not taking: Reported on 07/22/2020) 1 mL 5   Insulin Human (INSULIN PUMP) SOLN Inject into the skin continuous. Novolog insulin     ketoconazole (NIZORAL) 2 % shampoo APPLY 1 APPLICATION TOPICALLY 2 (TWO) TIMES A WEEK. 240 mL 1   lidocaine-prilocaine (EMLA) cream Apply 1 application topically every 2 (two) hours as needed. Please run with good Rx discount coupon 30 g 0   Melatonin 5 MG CHEW Chew 5 mg by mouth daily as needed (Sleep).     nitroGLYCERIN (NITROLINGUAL) 0.4 MG/SPRAY spray Place 1 spray under the tongue every 5 (five) minutes x 3 doses as needed for chest pain. 12 g 1   NOVOLOG 100 UNIT/ML injection Inject 40 Units into the skin daily.     ondansetron (ZOFRAN) 4 MG tablet Take 1 tablet (4 mg total) by mouth every 8 (eight) hours as needed for nausea or  vomiting. 10 tablet 0   OVER THE COUNTER MEDICATION as directed. Magnesium L-Treonate     oxycodone (OXY-IR) 5 MG capsule Take 1 capsule (5 mg total) by mouth every 4 (four) hours as needed. (Patient not taking: Reported on 07/22/2020) 30 capsule 0   Pancrelipase, Lip-Prot-Amyl, (CREON) 3000-9500 units CPEP Take 4 capsules by mouth 3 (three) times daily.     promethazine (PHENERGAN) 25 MG tablet Take 1 tablet (25 mg total) by mouth every 6 (six) hours as needed for nausea or vomiting. 10 tablet 0   Sod Picosulfate-Mag Ox-Cit Acd (CLENPIQ) 10-3.5-12 MG-GM -GM/160ML SOLN Take 1 kit by mouth as directed. 320 mL 0   spironolactone  (ALDACTONE) 25 MG tablet Take 50 mg by mouth daily.     torsemide ([name]) 20 MG tablet TAKE 1 TABLET ONCE DAILY INTHE MORNING. MAY TAKE AN   EXTRA 1/2 TABLET AS        NEEDED (Patient taking differently: Take 20 mg by mouth daily as needed (Water retention).) 135 tablet 0   valACYclovir (VALTREX) 500 MG tablet Take 500 mg by mouth daily as needed (Flair up).     No current facility-administered medications for this visit.     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   Family history of adverse reaction to anesthesia    mother: " while she was under she stopped breathing."   Fibromyalgia    Gastroparesis    GERD (gastroesophageal reflux disease)    Headache    migraines   Hypothyroidism    IBS (irritable bowel syndrome)    Idiopathic edema    Iron deficiency anemia 09/04/2019   Lyme disease    Mitral valve prolapse    Myocardial infarction (HCC)    1 major in 1999 and 2 minor " small vessel disease."   Osteoporosis    Peripheral neuropathy    Peripheral vascular disease (HCC)    Sinus disorder    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   Stroke (HCC)    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" " the second stroke was a lacunar     Past Surgical History:  Procedure Laterality Date   ABDOMINAL HYSTERECTOMY     APPENDECTOMY     BREAST SURGERY     B/L biopsy and lumpectomy    CARDIAC CATHETERIZATION     CARPAL TUNNEL RELEASE     CATARACT EXTRACTION W/ INTRAOCULAR LENS IMPLANT     right eye   COLONOSCOPY W/ BIOPSIES AND POLYPECTOMY     CORONARY ARTERY BYPASS GRAFT     coronary artery stents     at LAD and LIMA   ECTOPIC PREGNANCY SURGERY     NASAL SEPTUM  SURGERY     OPEN REDUCTION INTERNAL FIXATION (ORIF) DISTAL RADIAL FRACTURE Right 05/06/2015   Procedure: OPEN REDUCTION INTERNAL FIXATION (ORIF) RIGHT DISTAL RADIAL FRACTURE AND REPAIRS AS NEEDED;  Surgeon: Bradly Bienenstock, MD;  Location: [facility] OR;  Service: Orthopedics;  Laterality: Right;   ORIF HUMERUS FRACTURE Right 04/30/2020   Procedure: OPEN REDUCTION INTERNAL FIXATION (ORIF) PROXIMAL HUMERUS FRACTURE;  Surgeon: Justice Britain, MD;  Location: [facility];  Service: Orthopedics;  Laterality: Right;  135min   ORIF WRIST FRACTURE Left 11/05/2014   ORIF WRIST FRACTURE Left 11/05/2014   Procedure: OPEN REDUCTION INTERNAL FIXATION (ORIF) LEFT WRIST FRACTURE AND REPAIR AS INDICATED;  Surgeon: Iran Planas, MD;  Location: [facility];  Service: Orthopedics;  Laterality: Left;   TRIGGER FINGER RELEASE      Social History   Socioeconomic History   Marital status: Married    Spouse name: Not on file   Number of children: 0   Years of education: college   Highest education level: Not on file  Occupational History   Occupation: Retired  Tobacco Use   Smoking status: Former    Types: Cigarettes   Smokeless tobacco: Never   Tobacco comments:     " Quit smoking cigarettes in 20's "  Vaping Use   Vaping Use: Never used  Substance and Sexual Activity   Alcohol use: Yes    Comment: occasional beer or wine   Drug use: No   Sexual activity: Not on file  Other Topics Concern   Not on file  Social History Narrative   Drinks 1-2  cups caffeine drinks a day    Right handed   Lives at home with husband and dog   Social Determinants of Health   Financial Resource Strain: Not on file  Food Insecurity: Not on file  Transportation Needs: Not on file  Physical Activity: Not on file  Stress: Not on file  Social Connections: Not on file  Intimate Partner Violence: Not on file    Family History  Problem Relation Age of Onset   Breast cancer Mother    Migraines Mother    Heart disease Father    Hypertension  Father    Breast cancer Sister     ROS: no fevers or chills, productive cough, hemoptysis, dysphasia, odynophagia, melena, hematochezia, dysuria, hematuria, rash, seizure activity, orthopnea, PND, pedal edema, claudication. Remaining systems are negative.  Physical Exam: Well-developed well-nourished in no acute distress.  Skin is warm and dry.  HEENT is normal.  Neck is supple.  Chest is clear to auscultation with normal expansion.  Cardiovascular exam is regular rate and rhythm.  Abdominal exam nontender or distended. No masses palpated. Extremities show no edema. neuro grossly intact  ECG- personally reviewed  A/P  1 coronary artery disease-continue medical therapy; continue ASA; intolerant to statins. H/O atypical CP.   2 chronic diastolic congestive heart failure-continue diuretics at present dose.   3 hypertension-blood pressure controlled; continue present meds.   4 hyperlipidemia-patient is intolerant to statins and Zetia. She declines other lipid lowering meds.   5 diabetes mellitus-Per primary care.  Kirk Ruths, MD

## 2020-11-09 ENCOUNTER — Ambulatory Visit: Payer: 59 | Admitting: Cardiology

## 2020-11-25 ENCOUNTER — Encounter: Payer: Self-pay | Admitting: Family

## 2020-12-11 ENCOUNTER — Ambulatory Visit: Payer: Self-pay

## 2020-12-14 ENCOUNTER — Other Ambulatory Visit: Payer: Self-pay

## 2020-12-14 ENCOUNTER — Emergency Department (INDEPENDENT_AMBULATORY_CARE_PROVIDER_SITE_OTHER)
Admission: EM | Admit: 2020-12-14 | Discharge: 2020-12-14 | Disposition: A | Discharge status: Home / Self Care | Payer: Medicare Other | Source: Home / Self Care | Attending: Family Medicine | Admitting: Family Medicine

## 2020-12-14 ENCOUNTER — Inpatient Hospital Stay: Payer: Medicare Other

## 2020-12-14 ENCOUNTER — Emergency Department (INDEPENDENT_AMBULATORY_CARE_PROVIDER_SITE_OTHER): Payer: Medicare Other

## 2020-12-14 ENCOUNTER — Inpatient Hospital Stay: Payer: Medicare Other | Admitting: Hematology & Oncology

## 2020-12-14 DIAGNOSIS — S52022A Displaced fracture of olecranon process without intraarticular extension of left ulna, initial encounter for closed fracture: Secondary | ICD-10-CM

## 2020-12-14 DIAGNOSIS — M25522 Pain in left elbow: Secondary | ICD-10-CM

## 2020-12-14 DIAGNOSIS — W19XXXA Unspecified fall, initial encounter: Secondary | ICD-10-CM | POA: Diagnosis not present

## 2020-12-14 DIAGNOSIS — R0789 Other chest pain: Secondary | ICD-10-CM

## 2020-12-14 NOTE — ED Provider Notes (Signed)
[name]    CSN: [id number] Arrival date & time: 12/14/20  1324      History   Chief Complaint Chief Complaint  Patient presents with   Elbow Pain    LT   Chest Injury    STERNUM    HPI Ziyon Cedotal is a 65 y.o. female.   HPI  Patient had a fall on Tuesday of last week.  She is here with continued pain and is worried about fracture.  She does have known osteoporosis.  She does have known repeated falls.  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 She has pain in her left elbow.  She has pain with movement.  Its black and blue bruised, also swollen.  She has limited use of her left arm. She also has pain in her sternum.  States she had open heart surgery at the age of 72.  She states after the surgery she had problems with broken wires.  She states her chest feels the same.  She has pain in her sternum towards the lower border.  Pain with deep breath.  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   Family history of adverse reaction to anesthesia    mother: " while she was under she stopped breathing."   Fibromyalgia    Gastroparesis    GERD (gastroesophageal reflux disease)    Headache    migraines   Hypothyroidism    IBS (irritable bowel syndrome)    Idiopathic edema    Iron deficiency anemia 09/04/2019   Lyme disease    Mitral valve prolapse    Myocardial infarction ([place])    1 major in 1999 and 2 minor " small vessel disease."   Osteoporosis    Peripheral neuropathy    Peripheral vascular disease ([place])    Sinus disorder    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    Stroke [facility])    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" " the second stroke was a lacunar     Patient Active Problem List   Diagnosis Date Noted   Urinary frequency 10/12/2020   Chronic migraine without aura, with intractable migraine, so stated, with status migrainosus 04/01/2020   Herpes genitalis 11/07/2019   History of multiple cerebrovascular accidents (CVAs) 10/30/2019   Iron deficiency anemia 09/04/2019   History of hyperbaric oxygen therapy 08/22/2019   DNR (do not resuscitate) 12/10/2018   OAB (overactive bladder) 11/20/2018   Recurrent UTI 11/20/2018   Vaginal atrophy 11/20/2018   Dizziness 10/22/2018   Tinnitus, right 10/22/2018   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   Arthritis 02/23/2018   Asthma 02/23/2018   Carpal tunnel syndrome 02/23/2018   Gastroesophageal reflux disease 02/23/2018   Seasonal affective disorder ([place]) 02/23/2018   Sepsis with metabolic encephalopathy ([place]) 02/15/2017   Hypertension associated with diabetes ([place]) 02/14/2017   Brain atrophy ([place]) 05/24/2016   Chronic headache 05/24/2016   Lacunar stroke ([place]) 01/20/2016   Memory loss 01/20/2016   Depression 01/20/2016   Babesiosis 12/16/2015   Lyme disease 12/16/2015  Small intestinal bacterial overgrowth 11/19/2015   Hyperlipidemia 09/25/2015   Closed displaced intra-articular fracture of right calcaneus 06/16/2015   Myopia of both eyes with astigmatism and presbyopia 05/21/2015   Vitreous syneresis of both eyes 05/21/2015   Combined form of age-related cataract, left eye 05/21/2015   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   Pseudophakia, right eye 05/21/2015   CAD (coronary artery disease) 11/19/2014   Diabetes mellitus type I ([place]) 11/19/2014   DDD (degenerative disc disease), cervical 03/11/2014   Cervical spondylosis with radiculopathy 03/11/2014   Spondylolisthesis of cervical region 03/11/2014   Vitreous hemorrhage of left eye ([place]) 03/03/2014   DDD  (degenerative disc disease), thoracic 01/07/2014   Anemia due to stage 3 chronic kidney disease ([place]) 12/16/2013   Osteoporosis 11/14/2013   Cataract due to secondary diabetes ([place]) 08/26/2013   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 associated with type 2 diabetes mellitus ([place]) 08/26/2013   Hypothyroidism due to acquired atrophy of thyroid 08/02/2013   Acquired hypothyroidism 08/02/2013   Cerebral artery occlusion with cerebral infarction ([place]) 07/14/2013   History of diabetic gastroparesis 04/09/2013   Irritable bowel syndrome with diarrhea 04/09/2013   Anemia of chronic disease 04/08/2013   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 ([place]) 51/02/5850   CKD stage 3 due to type 1 diabetes mellitus ([place]) 04/08/2013    Past Surgical History:  Procedure Laterality Date   ABDOMINAL HYSTERECTOMY     APPENDECTOMY     BREAST SURGERY     B/L biopsy and lumpectomy    CARDIAC CATHETERIZATION     CARPAL TUNNEL RELEASE     CATARACT EXTRACTION W/ INTRAOCULAR LENS IMPLANT     right eye   COLONOSCOPY W/ BIOPSIES AND POLYPECTOMY     CORONARY ARTERY BYPASS GRAFT     coronary artery stents     at [place] and [place]   ECTOPIC PREGNANCY SURGERY     NASAL SEPTUM SURGERY     OPEN REDUCTION INTERNAL FIXATION (ORIF) DISTAL RADIAL FRACTURE Right 05/06/2015   Procedure: OPEN REDUCTION INTERNAL FIXATION (ORIF) RIGHT DISTAL RADIAL FRACTURE AND REPAIRS AS NEEDED;  Surgeon: Iran Planas, MD;  Location: [facility];  Service: Orthopedics;  Laterality: Right;   ORIF HUMERUS FRACTURE Right 04/30/2020   Procedure: OPEN REDUCTION INTERNAL FIXATION (ORIF) PROXIMAL HUMERUS FRACTURE;  Surgeon: Justice Britain, MD;  Location: [facility];  Service: Orthopedics;  Laterality: Right;  180min   ORIF WRIST FRACTURE Left 11/05/2014   ORIF WRIST FRACTURE Left 11/05/2014   Procedure: OPEN REDUCTION INTERNAL FIXATION (ORIF) LEFT WRIST FRACTURE AND REPAIR AS INDICATED;  Surgeon: Iran Planas, MD;  Location: [facility];  Service: Orthopedics;  Laterality: Left;   TRIGGER FINGER RELEASE      OB History   No obstetric history on file.      Home Medications    Prior to Admission medications   Medication Sig Start Date End Date Taking? Authorizing Provider  AMBULATORY NON FORMULARY MEDICATION Incontinence pads per patient preference, #unlimited, refill [phone] Fax to [phone] 12/25/18   Emeterio Reeve, DO  Armodafinil 250 MG tablet 1 tab po q day 07/13/20   SaguierPercell Miller, PA-C  ARMOUR THYROID 60 MG tablet Take 1 tablet (60 mg total) by mouth daily before breakfast. 06/10/20   Emeterio Reeve, DO  aspirin EC 325 MG tablet Take 1 tablet (325 mg total) by mouth daily. Patient taking differently: Take 325 mg by mouth at bedtime. 03/06/19   Lelon Perla, MD  carvedilol (  COREG) 12.5 MG tablet TAKE 1 TABLET BY MOUTH TWICE DAILY WITH MEALS 06/23/20   Lelon Perla, MD  Continuous Blood Gluc Sensor (DEXCOM G6 SENSOR) MISC USE TO CONTINUOUSLY MONITOR BLOOD SUGARS. CHANGE EVERY 10 DAYS 04/02/19   [provider]  cyclobenzaprine (FLEXERIL) 10 MG tablet Take 0.5-1 tablets (5-10 mg total) by mouth 3 (three) times daily as needed for muscle spasms. Caution: can cause drowsiness 05/01/20   Shuford, Olivia Mackie, PA-C  diclofenac Sodium (VOLTAREN) 1 % GEL Apply 2 g topically daily as needed (Pain).    [provider]  diphenhydrAMINE (BENADRYL) 25 MG tablet Take 25 mg by mouth at bedtime.    [provider]  diphenoxylate-atropine (LOMOTIL) 2.5-0.025 MG tablet TAKE 1 TABLET BY MOUTH 4 TIMES DAILY AS NEEDED FOR  DIARRHEA  OR  LOOSE  STOOLS Patient taking differently: Take 1 tablet by mouth daily as needed for diarrhea or loose stools. 01/14/20   [name], MD  esomeprazole (NEXIUM) 20 MG capsule Take 20 mg by mouth every morning.     [provider]  fluocinonide (LIDEX) 0.05 % external solution Apply 1 application topically See admin instructions. Once every three days     [provider]  gabapentin (NEURONTIN) 100 MG capsule Take 1-2 capsules (100-200 mg total) by mouth 3 (three) times daily as needed. Patient taking differently: Take 100-200 mg by mouth 2 (two) times daily. Pain and headache 10/04/19   Samuel Bouche, NP  Galcanezumab-gnlm (EMGALITY) 120 MG/ML SOAJ Inject 120 mg into the skin every 30 (thirty) days. Patient not taking: Reported on 07/22/2020 05/14/20   Melvenia Beam, MD  Insulin Human (INSULIN PUMP) SOLN Inject into the skin continuous. Novolog insulin    [provider]  ketoconazole (NIZORAL) 2 % shampoo APPLY 1 APPLICATION TOPICALLY 2 (TWO) TIMES A WEEK. 08/17/20   Saguier, Percell Miller, PA-C  lidocaine-prilocaine (EMLA) cream Apply 1 application topically every 2 (two) hours as needed. Please run with good Rx discount coupon 11/08/19   Emeterio Reeve, DO  Melatonin 5 MG CHEW Chew 5 mg by mouth daily as needed (Sleep).    [provider]  nitroGLYCERIN (NITROLINGUAL) 0.4 MG/SPRAY spray Place 1 spray under the tongue every 5 (five) minutes x 3 doses as needed for chest pain. 03/06/19   Lelon Perla, MD  NOVOLOG 100 UNIT/ML injection Inject 40 Units into the skin daily. 06/08/20   [provider]  ondansetron (ZOFRAN) 4 MG tablet Take 1 tablet (4 mg total) by mouth every 8 (eight) hours as needed for nausea or vomiting. 05/01/20   Shuford, Olivia Mackie, PA-C  OVER THE COUNTER MEDICATION as directed. Magnesium L-Treonate    [provider]  oxycodone (OXY-IR) 5 MG capsule Take 1 capsule (5 mg total) by mouth every 4 (four) hours as needed. Patient not taking: Reported on 07/22/2020 05/01/20   Shuford, Olivia Mackie, PA-C  Pancrelipase, Lip-Prot-Amyl, (CREON) 3000-9500 units CPEP Take 4 capsules by mouth 3 (three) times daily. 06/25/20   [provider]  promethazine (PHENERGAN) 25 MG tablet Take 1 tablet (25 mg total) by mouth every 6 (six) hours as needed for nausea or vomiting. 05/01/20   Shuford, Olivia Mackie, PA-C  Sod  Picosulfate-Mag Ox-Cit Acd (CLENPIQ) 10-3.5-12 MG-GM -GM/160ML SOLN Take 1 kit by mouth as directed. 07/03/20   Zehr, Laban Emperor, PA-C  spironolactone (ALDACTONE) 25 MG tablet Take 50 mg by mouth daily. 05/09/19   [provider]  torsemide ([name]) 20 MG tablet TAKE 1 TABLET ONCE DAILY  INTHE MORNING. MAY TAKE AN   EXTRA 1/2 TABLET AS        NEEDED Patient taking differently: Take 20 mg by mouth daily as needed (Water retention). 01/15/18   Trixie Dredge, PA-C  valACYclovir (VALTREX) 500 MG tablet Take 500 mg by mouth daily as needed (Flair up).    [provider]    Family History Family History  Problem Relation Age of Onset   Breast cancer Mother    Migraines Mother    Heart disease Father    Hypertension Father    Breast cancer Sister     Social History Social History   Tobacco Use   Smoking status: Former    Types: Cigarettes   Smokeless tobacco: Never   Tobacco comments:     " Quit smoking cigarettes in 20's "  Vaping Use   Vaping Use: Never used  Substance Use Topics   Alcohol use: Yes    Comment: occasional beer or wine   Drug use: No     Allergies   Morphine, Penicillins, Tramadol, Acetaminophen, Amitriptyline, Codeine, Ibuprofen, Losartan, Propoxyphene, Shellfish allergy, Statins, Sulfamethoxazole, Krill oil, Fluconazole, and Norvasc [amlodipine besylate]   Review of Systems Review of Systems  See HPI Physical Exam Triage Vital Signs ED Triage Vitals  Enc Vitals Group     BP 12/14/20 1342 (!) 192/96     Pulse Rate 12/14/20 1342 71     Resp 12/14/20 1342 18     Temp 12/14/20 1342 97.9 F (36.6 C)     Temp Source 12/14/20 1342 Esophageal     SpO2 12/14/20 1342 100 %     Weight --      Height --      Head Circumference --      Peak Flow --      Pain Score 12/14/20 1343 7     Pain Loc --      Pain Edu? --      Excl. in [place]? --    No data found.  Updated Vital Signs BP (!) 192/96 (BP Location: Right Arm)   Pulse 71    Temp 97.9 F (36.6 C) (Esophageal)   Resp 18   SpO2 100%       Physical Exam Constitutional:      General: She is not in acute distress.    Appearance: She is well-developed. She is ill-appearing.     Comments: Appears frail.  Appears uncomfortable.  HENT:     Head: Normocephalic and atraumatic.     Nose:     Comments: Mask is in place Eyes:     Conjunctiva/sclera: Conjunctivae normal.     Pupils: Pupils are equal, round, and reactive to light.  Cardiovascular:     Rate and Rhythm: Normal rate and regular rhythm.  Pulmonary:     Effort: Pulmonary effort is normal. No respiratory distress.     Comments: Chest wall tenderness over the lower sternum near the xiphoid.  No visible bruising.  No palpable deformity Chest:     Chest wall: Tenderness present.  Abdominal:     General: There is no distension.     Palpations: Abdomen is soft.  Musculoskeletal:        General: Normal range of motion.     Cervical back: Normal range of motion and neck supple.     Comments: Left elbow has soft tissue swelling over the olecranon region, deep purple ecchymosis that extends from the olecranon process halfway down the forearm.  Patient lacks full extension.  Pain with internal or external rotation.  Skin:    General: Skin is warm and dry.  Neurological:     Mental Status: She is alert.     UC Treatments / Results  Labs (all labs ordered are listed, but only abnormal results are displayed) Labs Reviewed - No data to display  EKG   Radiology DG Sternum  Result Date: 12/14/2020 CLINICAL DATA:  Fall 5 days ago. Persistent sternal pain. Initial encounter. EXAM: STERNUM - 2+ VIEW COMPARISON:  None. FINDINGS: There is no evidence of acute sternal fracture. Prior median sternotomy noted, and the superior-most sternal wire is broken. IMPRESSION: No acute fracture. Superior-most sternal wire is broken. Electronically Signed   By: Marlaine Hind M.D.   On: 12/14/2020 15:04   DG Elbow Complete  Left  Result Date: 12/14/2020 CLINICAL DATA:  Fall 5 days ago. Persistent left elbow pain. Initial encounter. EXAM: LEFT ELBOW - COMPLETE 3+ VIEW COMPARISON:  None. FINDINGS: Diffuse soft tissue swelling and elbow joint effusion are seen. A nondisplaced fracture is seen through the olecranon process of the ulna. Other fractures are identified. No evidence of dislocation. Peripheral vascular calcification also noted. IMPRESSION: Nondisplaced fracture through olecranon process of ulna, with elbow joint effusion. Electronically Signed   By: Marlaine Hind M.D.   On: 12/14/2020 15:02    Procedures Procedures (including critical care time)  Medications Ordered in UC Medications - No data to display  Initial Impression / Assessment and Plan / UC Course  I have reviewed the triage vital signs and the nursing notes.  Pertinent labs & imaging results that were available during my care of the patient were reviewed by me and considered in my medical decision making (see chart for details).     Elbow fracture.  I called [facility] and arrange for her to be splinted at their walk-in clinic.  She will follow-up with Dr. Apolonio Schneiders Final Clinical Impressions(s) / UC Diagnoses   Final diagnoses:  Acute chest wall pain  Olecranon fracture, left, closed, initial encounter     Discharge Instructions       Go to the emergency Ortho walk-in clinic today For splinting and care     ED Prescriptions   None    PDMP not reviewed this encounter.   Raylene Everts, MD 12/15/20 [phone]

## 2020-12-14 NOTE — Discharge Instructions (Addendum)
  Go to the emergency Ortho walk-in clinic today For splinting and care

## 2020-12-14 NOTE — ED Triage Notes (Signed)
Pt c/o pain in LT elbow and sternum since Wednesday when she fell onto her stomach on her hardwood floors. Pain 7/10 Heating pad prn. Hx of osteoporosis and fibromyalgia.

## 2020-12-15 ENCOUNTER — Emergency Department (INDEPENDENT_AMBULATORY_CARE_PROVIDER_SITE_OTHER)
Admission: EM | Admit: 2020-12-15 | Discharge: 2020-12-15 | Disposition: A | Discharge status: Home / Self Care | Payer: Medicare Other | Source: Home / Self Care | Attending: Family Medicine | Admitting: Family Medicine

## 2020-12-15 ENCOUNTER — Encounter: Payer: Self-pay | Admitting: *Deleted

## 2020-12-15 DIAGNOSIS — N3001 Acute cystitis with hematuria: Secondary | ICD-10-CM

## 2020-12-15 DIAGNOSIS — R350 Frequency of micturition: Secondary | ICD-10-CM

## 2020-12-15 LAB — POCT URINALYSIS DIP (MANUAL ENTRY)
Bilirubin, UA: NEGATIVE
Glucose, UA: 100 mg/dL — AB
Ketones, POC UA: NEGATIVE mg/dL
Leukocytes, UA: NEGATIVE
Nitrite, UA: NEGATIVE
Protein Ur, POC: >=300 mg/dL — AB
Spec Grav, UA: 1.025 (ref 1.010–1.025)
Urobilinogen, UA: 0.2 E.U./dL
pH, UA: 6.5 (ref 5.0–8.0)

## 2020-12-15 MED ORDER — CEPHALEXIN 250 MG PO CAPS: 250.0000 mg | ORAL_CAPSULE | Freq: Two times a day (BID) | ORAL | 0 refills | Status: DC

## 2020-12-15 NOTE — Discharge Instructions (Signed)
Drink lots of water The urine culture is sent to the lab Take the cephalexin 2 x a day for 5 days  Consider follow up at the [facility] outpatient clinic of [facility] Dr Donny Pique or associates [phone]

## 2020-12-15 NOTE — ED Provider Notes (Signed)
[facility]    CSN: [id number] Arrival date & time: 12/15/20  1442      History   Chief Complaint No chief complaint on file.   HPI Brittney Pace is a 65 y.o. female.   HPI  Patient was here yesterday and it was identified that she has a fracture of her elbow.  She neglected to mention at that time that she has been having some urinary symptoms.  She has dysuria and loss of bladder control, and a foul urine odor off and on for the last couple of weeks.  No fever or chills.  No flank pain.  No history of kidney stone or kidney infection.  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)    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" " the second stroke was a lacunar     Patient Active Problem List   Diagnosis Date Noted   Urinary frequency 10/12/2020   Chronic migraine without aura, with intractable migraine, so stated, with status migrainosus 04/01/2020   Herpes genitalis 11/07/2019   History of multiple cerebrovascular accidents (CVAs) 10/30/2019   Iron  deficiency anemia 09/04/2019   History of hyperbaric oxygen therapy 08/22/2019   DNR (do not resuscitate) 12/10/2018   OAB (overactive bladder) 11/20/2018   Recurrent UTI 11/20/2018   Vaginal atrophy 11/20/2018   Dizziness 10/22/2018   Tinnitus, right 10/22/2018   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   Arthritis 02/23/2018   Asthma 02/23/2018   Carpal tunnel syndrome 02/23/2018   Gastroesophageal reflux disease 02/23/2018   Seasonal affective disorder (HCC) 02/23/2018   Sepsis with metabolic encephalopathy (HCC) 86/48/5984   Hypertension associated with diabetes (HCC) 02/14/2017   Brain atrophy (HCC) 05/24/2016   Chronic headache 05/24/2016   Lacunar stroke (HCC) 01/20/2016   Memory loss 01/20/2016   Depression 01/20/2016   Babesiosis 12/16/2015   Lyme disease 12/16/2015   Small intestinal bacterial overgrowth 11/19/2015   Hyperlipidemia 09/25/2015   Closed displaced intra-articular fracture of right calcaneus 06/16/2015   Myopia of both eyes with astigmatism and presbyopia 05/21/2015   Vitreous syneresis of both eyes 05/21/2015   Combined form of age-related cataract, left eye 05/21/2015   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   Pseudophakia, right eye 05/21/2015   CAD (coronary artery disease) 11/19/2014   Diabetes mellitus type I (HCC) 11/19/2014  DDD (degenerative disc disease), cervical 03/11/2014   Cervical spondylosis with radiculopathy 03/11/2014   Spondylolisthesis of cervical region 03/11/2014   Vitreous hemorrhage of left eye ([place]) 03/03/2014   DDD (degenerative disc disease), thoracic 01/07/2014   Anemia due to stage 3 chronic kidney disease ([place]) 12/16/2013   Osteoporosis 11/14/2013   Cataract due to secondary diabetes ([place]) 08/26/2013   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 associated with type 2 diabetes mellitus ([place]) 08/26/2013   Hypothyroidism due to acquired atrophy of thyroid 08/02/2013    Acquired hypothyroidism 08/02/2013   Cerebral artery occlusion with cerebral infarction ([place]) 07/14/2013   History of diabetic gastroparesis 04/09/2013   Irritable bowel syndrome with diarrhea 04/09/2013   Anemia of chronic disease 04/08/2013   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 ([place]) 35/32/9924   CKD stage 3 due to type 1 diabetes mellitus ([place]) 04/08/2013    Past Surgical History:  Procedure Laterality Date   ABDOMINAL HYSTERECTOMY     APPENDECTOMY     BREAST SURGERY     B/L biopsy and lumpectomy    CARDIAC CATHETERIZATION     CARPAL TUNNEL RELEASE     CATARACT EXTRACTION W/ INTRAOCULAR LENS IMPLANT     right eye   COLONOSCOPY W/ BIOPSIES AND POLYPECTOMY     CORONARY ARTERY BYPASS GRAFT     coronary artery stents     at [place] and [place]   ECTOPIC PREGNANCY SURGERY     NASAL SEPTUM SURGERY     OPEN REDUCTION INTERNAL FIXATION (ORIF) DISTAL RADIAL FRACTURE Right 05/06/2015   Procedure: OPEN REDUCTION INTERNAL FIXATION (ORIF) RIGHT DISTAL RADIAL FRACTURE AND REPAIRS AS NEEDED;  Surgeon: Iran Planas, MD;  Location: [place];  Service: Orthopedics;  Laterality: Right;   ORIF HUMERUS FRACTURE Right 04/30/2020   Procedure: OPEN REDUCTION INTERNAL FIXATION (ORIF) PROXIMAL HUMERUS FRACTURE;  Surgeon: Justice Britain, MD;  Location: [facility];  Service: Orthopedics;  Laterality: Right;  19min   ORIF WRIST FRACTURE Left 11/05/2014   ORIF WRIST FRACTURE Left 11/05/2014   Procedure: OPEN REDUCTION INTERNAL FIXATION (ORIF) LEFT WRIST FRACTURE AND REPAIR AS INDICATED;  Surgeon: Iran Planas, MD;  Location: [facility];  Service: Orthopedics;  Laterality: Left;   TRIGGER FINGER RELEASE      OB History   No obstetric history on file.      Home Medications    Prior to Admission medications   Medication Sig Start Date End Date Taking? Authorizing Provider  cephALEXin (KEFLEX) 250 MG capsule Take 1 capsule (250 mg total) by mouth 2 (two) times daily. 12/15/20  Yes Raylene Everts, MD   AMBULATORY NON FORMULARY MEDICATION Incontinence pads per patient preference, #unlimited, refill x99 Fax to [phone] 12/25/18   Emeterio Reeve, DO  Armodafinil 250 MG tablet 1 tab po q day 07/13/20   SaguierPercell Miller, PA-C  ARMOUR THYROID 60 MG tablet Take 1 tablet (60 mg total) by mouth daily before breakfast. 06/10/20   Emeterio Reeve, DO  aspirin EC 325 MG tablet Take 1 tablet (325 mg total) by mouth daily. Patient taking differently: Take 325 mg by mouth at bedtime. 03/06/19   Lelon Perla, MD  carvedilol (COREG) 12.5 MG tablet TAKE 1 TABLET BY MOUTH TWICE DAILY WITH MEALS 06/23/20   Lelon Perla, MD  Continuous Blood Gluc Sensor (DEXCOM G6 SENSOR) MISC USE TO CONTINUOUSLY MONITOR BLOOD SUGARS. CHANGE EVERY 10 DAYS 04/02/19   [provider]  cyclobenzaprine (FLEXERIL) 10 MG tablet Take 0.5-1 tablets (5-10  mg total) by mouth 3 (three) times daily as needed for muscle spasms. Caution: can cause drowsiness 05/01/20   Shuford, Olivia Mackie, PA-C  diclofenac Sodium (VOLTAREN) 1 % GEL Apply 2 g topically daily as needed (Pain).    [provider]  diphenhydrAMINE (BENADRYL) 25 MG tablet Take 25 mg by mouth at bedtime.    [provider]  diphenoxylate-atropine (LOMOTIL) 2.5-0.025 MG tablet TAKE 1 TABLET BY MOUTH 4 TIMES DAILY AS NEEDED FOR  DIARRHEA  OR  LOOSE  STOOLS Patient taking differently: Take 1 tablet by mouth daily as needed for diarrhea or loose stools. 01/14/20   [name], MD  esomeprazole (NEXIUM) 20 MG capsule Take 20 mg by mouth every morning.     [provider]  fluocinonide (LIDEX) 0.05 % external solution Apply 1 application topically See admin instructions. Once every three days    [provider]  gabapentin (NEURONTIN) 100 MG capsule Take 1-2 capsules (100-200 mg total) by mouth 3 (three) times daily as needed. Patient taking differently: Take 100-200 mg by mouth 2 (two) times daily. Pain and headache 10/04/19   Samuel Bouche, NP  Galcanezumab-gnlm (EMGALITY) 120 MG/ML SOAJ Inject 120 mg into the skin every 30 (thirty) days. Patient not taking: Reported on 07/22/2020 05/14/20   Melvenia Beam, MD  Insulin Human (INSULIN PUMP) SOLN Inject into the skin continuous. Novolog insulin    [provider]  ketoconazole (NIZORAL) 2 % shampoo APPLY 1 APPLICATION TOPICALLY 2 (TWO) TIMES A WEEK. 08/17/20   Saguier, Percell Miller, PA-C  lidocaine-prilocaine (EMLA) cream Apply 1 application topically every 2 (two) hours as needed. Please run with good Rx discount coupon 11/08/19   Emeterio Reeve, DO  Melatonin 5 MG CHEW Chew 5 mg by mouth daily as needed (Sleep).    [provider]  nitroGLYCERIN (NITROLINGUAL) 0.4 MG/SPRAY spray Place 1 spray under the tongue every 5 (five) minutes x 3 doses as needed for chest pain. 03/06/19   Lelon Perla, MD  NOVOLOG 100 UNIT/ML injection Inject 40 Units into the skin daily. 06/08/20   [provider]  ondansetron (ZOFRAN) 4 MG tablet Take 1 tablet (4 mg total) by mouth every 8 (eight) hours as needed for nausea or vomiting. 05/01/20   Shuford, Olivia Mackie, PA-C  OVER THE COUNTER MEDICATION as directed. Magnesium L-Treonate    [provider]  oxycodone (OXY-IR) 5 MG capsule Take 1 capsule (5 mg total) by mouth every 4 (four) hours as needed. Patient not taking: Reported on 07/22/2020 05/01/20   Shuford, Olivia Mackie, PA-C  Pancrelipase, Lip-Prot-Amyl, (CREON) 3000-9500 units CPEP Take 4 capsules by mouth 3 (three) times daily. 06/25/20   [provider]  promethazine (PHENERGAN) 25 MG tablet Take 1 tablet (25 mg total) by mouth every 6 (six) hours as needed for nausea or vomiting. 05/01/20   Shuford, Olivia Mackie, PA-C  Sod Picosulfate-Mag Ox-Cit Acd (CLENPIQ) 10-3.5-12 MG-GM -GM/160ML SOLN Take 1 kit by mouth as directed. 07/03/20   Zehr, Laban Emperor, PA-C  spironolactone (ALDACTONE) 25 MG tablet Take 50 mg by mouth daily. 05/09/19   [provider]  torsemide ([name])  20 MG tablet TAKE 1 TABLET ONCE DAILY INTHE MORNING. MAY TAKE AN   EXTRA 1/2 TABLET AS        NEEDED Patient taking differently: Take 20 mg by mouth daily as needed (Water retention). 01/15/18   Trixie Dredge, PA-C  valACYclovir (VALTREX) 500 MG tablet Take 500 mg by mouth daily as needed (Flair up).  [provider]    Family History Family History  Problem Relation Age of Onset   Breast cancer Mother    Migraines Mother    Heart disease Father    Hypertension Father    Breast cancer Sister     Social History Social History   Tobacco Use   Smoking status: Former    Types: Cigarettes   Smokeless tobacco: Never   Tobacco comments:     " Quit smoking cigarettes in 20's "  Vaping Use   Vaping Use: Never used  Substance Use Topics   Alcohol use: Yes    Comment: occasional beer or wine   Drug use: No     Allergies   Morphine, Penicillins, Tramadol, Acetaminophen, Amitriptyline, Codeine, Ibuprofen, Losartan, Propoxyphene, Shellfish allergy, Statins, Sulfamethoxazole, Krill oil, Fluconazole, and Norvasc [amlodipine besylate]   Review of Systems Review of Systems See HPI  Physical Exam Triage Vital Signs ED Triage Vitals  Enc Vitals Group     BP 12/15/20 1456 (!) 207/78     Pulse Rate 12/15/20 1456 68     Resp 12/15/20 1456 16     Temp 12/15/20 1456 98.2 F (36.8 C)     Temp Source 12/15/20 1456 Oral     SpO2 12/15/20 1456 100 %     Weight --      Height --      Head Circumference --      Peak Flow --      Pain Score 12/15/20 1501 0     Pain Loc --      Pain Edu? --      Excl. in GC? --    No data found.  Updated Vital Signs BP (!) 207/78 (BP Location: Right Wrist)   Pulse 68   Temp 98.2 F (36.8 C) (Oral)   Resp 16   SpO2 100%   Physical Exam Constitutional:      General: She is not in acute distress.    Appearance: She is well-developed. She is ill-appearing.     Comments: Small and frail.  Long-arm splint on left arm  protecting elbow  HENT:     Head: Normocephalic and atraumatic.     Mouth/Throat:     Comments: Mask is in place Eyes:     Conjunctiva/sclera: Conjunctivae normal.     Pupils: Pupils are equal, round, and reactive to light.  Cardiovascular:     Rate and Rhythm: Normal rate.  Pulmonary:     Effort: Pulmonary effort is normal. No respiratory distress.  Abdominal:     General: There is no distension.     Palpations: Abdomen is soft.     Tenderness: There is no right CVA tenderness or left CVA tenderness.  Musculoskeletal:        General: Normal range of motion.     Cervical back: Normal range of motion.  Skin:    General: Skin is warm and dry.  Neurological:     Mental Status: She is alert.     UC Treatments / Results  Labs (all labs ordered are listed, but only abnormal results are displayed) Labs Reviewed  POCT URINALYSIS DIP (MANUAL ENTRY) - Abnormal; Notable for the following components:      Result Value   Clarity, UA cloudy (*)    Glucose, UA =100 (*)    Blood, UA small (*)    Protein Ur, POC >=300 (*)    All other components within normal limits    EKG  RadiologyProcedures Procedures (including critical care time)  Medications Ordered in UC Medications - No data to display  Initial Impression / Assessment and Plan / UC Course  I have reviewed the triage vital signs and the nursing notes.  Pertinent labs & imaging results that were available during my care of the patient were reviewed by me and considered in my medical decision making (see chart for details).     Urine is moderately abnormal with red blood cells sugar and protein.  Will send for culture.  I will cover her with antibiotics until the culture is available.  I have recommended follow-up with primary care.  Her primary care doctor has left the area and she chooses to go to geriatrics.  Since she lives in [place] I have provided her with the number for the geriatric outpatient clinic of [facility] Final Clinical Impressions(s) / UC Diagnoses   Final diagnoses:  Acute cystitis with hematuria     Discharge Instructions      Drink lots of water The urine culture is sent to the lab Take the cephalexin 2 x a day for 5 days  Consider follow up at the Geriatric outpatient clinic of [facility] Dr Donny Pique or associates [phone]    ED Prescriptions     Medication Sig Dispense Auth. Provider   cephALEXin (KEFLEX) 250 MG capsule Take 1 capsule (250 mg total) by mouth 2 (two) times daily. 10 capsule Raylene Everts, MD      PDMP not reviewed this encounter.   Raylene Everts, MD 12/15/20 [phone]

## 2020-12-15 NOTE — ED Triage Notes (Signed)
Patient c/o dysuria and loss of bladder control and c/o mal odor. Reports its possibly been a few weeks but shes not sure.

## 2020-12-18 LAB — URINE CULTURE
MICRO NUMBER:: 12673306
SPECIMEN QUALITY:: ADEQUATE

## 2020-12-24 ENCOUNTER — Other Ambulatory Visit: Payer: Self-pay | Admitting: Hematology & Oncology

## 2020-12-24 ENCOUNTER — Other Ambulatory Visit: Payer: Self-pay

## 2020-12-24 ENCOUNTER — Inpatient Hospital Stay: Payer: Medicare Other | Attending: Hematology & Oncology

## 2020-12-24 ENCOUNTER — Inpatient Hospital Stay (HOSPITAL_BASED_OUTPATIENT_CLINIC_OR_DEPARTMENT_OTHER): Payer: Medicare Other | Admitting: Hematology & Oncology

## 2020-12-24 ENCOUNTER — Telehealth: Payer: Self-pay

## 2020-12-24 ENCOUNTER — Inpatient Hospital Stay: Payer: Medicare Other

## 2020-12-24 ENCOUNTER — Encounter: Payer: Self-pay | Admitting: Hematology & Oncology

## 2020-12-24 VITALS — BP 169/85 | HR 60 | Temp 97.7°F | Resp 18 | Wt 95.5 lb

## 2020-12-24 DIAGNOSIS — E1022 Type 1 diabetes mellitus with diabetic chronic kidney disease: Secondary | ICD-10-CM

## 2020-12-24 DIAGNOSIS — Z8673 Personal history of transient ischemic attack (TIA), and cerebral infarction without residual deficits: Secondary | ICD-10-CM | POA: Diagnosis not present

## 2020-12-24 DIAGNOSIS — S52023A Displaced fracture of olecranon process without intraarticular extension of unspecified ulna, initial encounter for closed fracture: Secondary | ICD-10-CM | POA: Insufficient documentation

## 2020-12-24 DIAGNOSIS — N181 Chronic kidney disease, stage 1: Secondary | ICD-10-CM | POA: Diagnosis not present

## 2020-12-24 DIAGNOSIS — N183 Chronic kidney disease, stage 3 unspecified: Secondary | ICD-10-CM

## 2020-12-24 DIAGNOSIS — E1122 Type 2 diabetes mellitus with diabetic chronic kidney disease: Secondary | ICD-10-CM | POA: Insufficient documentation

## 2020-12-24 DIAGNOSIS — D631 Anemia in chronic kidney disease: Secondary | ICD-10-CM

## 2020-12-24 DIAGNOSIS — Z794 Long term (current) use of insulin: Secondary | ICD-10-CM | POA: Diagnosis not present

## 2020-12-24 DIAGNOSIS — Z992 Dependence on renal dialysis: Secondary | ICD-10-CM

## 2020-12-24 DIAGNOSIS — D500 Iron deficiency anemia secondary to blood loss (chronic): Secondary | ICD-10-CM | POA: Diagnosis not present

## 2020-12-24 DIAGNOSIS — D460 Refractory anemia without ring sideroblasts, so stated: Secondary | ICD-10-CM | POA: Insufficient documentation

## 2020-12-24 DIAGNOSIS — N189 Chronic kidney disease, unspecified: Secondary | ICD-10-CM | POA: Diagnosis present

## 2020-12-24 DIAGNOSIS — D638 Anemia in other chronic diseases classified elsewhere: Secondary | ICD-10-CM

## 2020-12-24 DIAGNOSIS — N186 End stage renal disease: Secondary | ICD-10-CM

## 2020-12-24 LAB — CBC WITH DIFFERENTIAL (CANCER CENTER ONLY)
Abs Immature Granulocytes: 0.01 10*3/uL (ref 0.00–0.07)
Basophils Absolute: 0.1 10*3/uL (ref 0.0–0.1)
Basophils Relative: 1 %
Eosinophils Absolute: 0.2 10*3/uL (ref 0.0–0.5)
Eosinophils Relative: 3 %
HCT: 32.3 % — ABNORMAL LOW (ref 36.0–46.0)
Hemoglobin: 10.6 g/dL — ABNORMAL LOW (ref 12.0–15.0)
Immature Granulocytes: 0 %
Lymphocytes Relative: 27 %
Lymphs Abs: 1.6 10*3/uL (ref 0.7–4.0)
MCH: 29.7 pg (ref 26.0–34.0)
MCHC: 32.8 g/dL (ref 30.0–36.0)
MCV: 90.5 fL (ref 80.0–100.0)
Monocytes Absolute: 0.5 10*3/uL (ref 0.1–1.0)
Monocytes Relative: 8 %
Neutro Abs: 3.6 10*3/uL (ref 1.7–7.7)
Neutrophils Relative %: 61 %
Platelet Count: 439 10*3/uL — ABNORMAL HIGH (ref 150–400)
RBC: 3.57 MIL/uL — ABNORMAL LOW (ref 3.87–5.11)
RDW: 13.5 % (ref 11.5–15.5)
WBC Count: 6 10*3/uL (ref 4.0–10.5)
nRBC: 0 % (ref 0.0–0.2)

## 2020-12-24 LAB — CMP (CANCER CENTER ONLY)
ALT: 17 U/L (ref 0–44)
AST: 23 U/L (ref 15–41)
Albumin: 3.3 g/dL — ABNORMAL LOW (ref 3.5–5.0)
Alkaline Phosphatase: 123 U/L (ref 38–126)
Anion gap: 7 (ref 5–15)
BUN: 28 mg/dL — ABNORMAL HIGH (ref 8–23)
CO2: 27 mmol/L (ref 22–32)
Calcium: 9.3 mg/dL (ref 8.9–10.3)
Chloride: 97 mmol/L — ABNORMAL LOW (ref 98–111)
Creatinine: 1.54 mg/dL — ABNORMAL HIGH (ref 0.44–1.00)
GFR, Estimated: 37 mL/min — ABNORMAL LOW (ref >60)
Glucose, Bld: 312 mg/dL — ABNORMAL HIGH (ref 70–99)
Potassium: 4.9 mmol/L (ref 3.5–5.1)
Sodium: 131 mmol/L — ABNORMAL LOW (ref 135–145)
Total Bilirubin: 0.5 mg/dL (ref 0.3–1.2)
Total Protein: 5.8 g/dL — ABNORMAL LOW (ref 6.5–8.1)

## 2020-12-24 LAB — IRON AND TIBC
Iron: 75 ug/dL (ref 41–142)
Saturation Ratios: 28 % (ref 21–57)
TIBC: 266 ug/dL (ref 236–444)
UIBC: 192 ug/dL (ref 120–384)

## 2020-12-24 LAB — HEMOGLOBIN A1C
Hgb A1c MFr Bld: 9.6 % — ABNORMAL HIGH (ref 4.8–5.6)
Mean Plasma Glucose: 228.82 mg/dL

## 2020-12-24 LAB — RETICULOCYTES
Immature Retic Fract: 6 % (ref 2.3–15.9)
RBC.: 3.63 MIL/uL — ABNORMAL LOW (ref 3.87–5.11)
Retic Count, Absolute: 39.6 10*3/uL (ref 19.0–186.0)
Retic Ct Pct: 1.1 % (ref 0.4–3.1)

## 2020-12-24 LAB — FERRITIN: Ferritin: 254 ng/mL (ref 11–307)

## 2020-12-24 MED ORDER — DARBEPOETIN ALFA 300 MCG/0.6ML IJ SOSY
300.0000 ug | PREFILLED_SYRINGE | Freq: Once | INTRAMUSCULAR | Status: AC
  Administered 2020-12-24: 300 ug via SUBCUTANEOUS
  Filled 2020-12-24: qty 0.6

## 2020-12-24 NOTE — Telephone Encounter (Signed)
Refill request for Lomotil. Last refilled 12/2019. Seen in office today. Please advise for refills, thanks!

## 2020-12-24 NOTE — Telephone Encounter (Signed)
Responded via MyChart.

## 2020-12-24 NOTE — Progress Notes (Signed)
Hematology and Oncology Follow Up Visit  Brittney Pace [id number] Feb 04, 1955 65 y.o. 12/24/2020   Principle Diagnosis:  Anemia of erythropoietin deficiency - chronic kidney disease Insulin-dependent diabetes History of TIAs   Current Therapy:        Aranesp 300 mcg SQ to maintain Hgb > 11    Interim History:  Brittney Pace is here today for follow-up.  Unfortunately, couple weeks ago, she fell in her kitchen and broke her left elbow.  She has a splint/cast on the left arm.  She is seeing Dr. Amedeo Pace of Orthopedic Surgery.  I am sorry that she did this.  I just hate the fact that she broke her elbow.  Her blood sugars are still quite high.  There is also has a problem.  The high blood sugars may delay healing of the elbow.  She has had no obvious bleeding.  Is no cough or shortness of breath.  She has had some nausea.  She has had some urine issues.  There is been no obvious fever.  Overall, I would have to say that her performance status is probably ECOG 1.    Medications:  Allergies as of 12/24/2020       Reactions   Morphine Anaphylaxis, Shortness Of Breath   Penicillins Hives   Tolerated ANCEF on 04/30/20 Has patient had a PCN reaction causing immediate rash, facial/tongue/throat swelling, SOB or lightheadedness with hypotension: Yes Has patient had a PCN reaction causing severe rash involving mucus membranes or skin necrosis: No Has patient had a PCN reaction that required hospitalization No Has patient had a PCN reaction occurring within the last 10 years: No If all of the above answers are "NO", then may proceed with Cephalosporin use.   Tramadol Anaphylaxis   Acetaminophen Other (See Comments)   Alters insulin pump readings   Amitriptyline Other (See Comments)   Severe headache/ out of body feeling   Codeine Other (See Comments)   Severe headaches/ out of body feeling   Ibuprofen Other (See Comments)   Messes up CGM reading on glucose monitor   Losartan Cough    Propoxyphene Other (See Comments)   Severe headaches / out of body feeling   Shellfish Allergy Diarrhea, Nausea And Vomiting   Statins Other (See Comments)   Muscle pains Other reaction(s): Other   Sulfamethoxazole Other (See Comments)   Mouth ulcers   Krill Oil Diarrhea, Itching, Nausea And Vomiting   Fluconazole    Other reaction(s): Contact Dermatitis (intolerance)   Norvasc [amlodipine Besylate] Swelling        Medication List        Accurate as of December 24, 2020 10:42 AM. If you have any questions, ask your nurse or doctor.          AMBULATORY NON FORMULARY MEDICATION Incontinence pads per patient preference, #unlimited, refill [phone] Fax to [phone]   Armodafinil 250 MG tablet 1 tab po q day   Armour Thyroid 60 MG tablet Generic drug: thyroid Take 1 tablet (60 mg total) by mouth daily before breakfast.   aspirin EC 325 MG tablet Take 1 tablet (325 mg total) by mouth daily. What changed: when to take this   carvedilol 12.5 MG tablet Commonly known as: COREG TAKE 1 TABLET BY MOUTH TWICE DAILY WITH MEALS   cephALEXin 250 MG capsule Commonly known as: KEFLEX Take 1 capsule (250 mg total) by mouth 2 (two) times daily.   Clenpiq 10-3.5-12 MG-GM -GM/160ML Soln Generic drug: Sod Picosulfate-Mag Ox-Cit Acd Take 1  kit by mouth as directed.   Creon 3000-9500 units Cpep Generic drug: Pancrelipase (Lip-Prot-Amyl) Take 4 capsules by mouth 3 (three) times daily.   cyclobenzaprine 10 MG tablet Commonly known as: FLEXERIL Take 0.5-1 tablets (5-10 mg total) by mouth 3 (three) times daily as needed for muscle spasms. Caution: can cause drowsiness   Dexcom G6 Sensor Misc USE TO CONTINUOUSLY MONITOR BLOOD SUGARS. CHANGE EVERY 10 DAYS   diclofenac Sodium 1 % Gel Commonly known as: VOLTAREN Apply 2 g topically daily as needed (Pain).   diphenhydrAMINE 25 MG tablet Commonly known as: BENADRYL Take 25 mg by mouth at bedtime.   diphenoxylate-atropine 2.5-0.025  MG tablet Commonly known as: LOMOTIL TAKE 1 TABLET BY MOUTH 4 TIMES DAILY AS NEEDED FOR  DIARRHEA  OR  LOOSE  STOOLS What changed: See the new instructions.   Emgality 120 MG/ML Soaj Generic drug: Galcanezumab-gnlm Inject 120 mg into the skin every 30 (thirty) days.   esomeprazole 20 MG capsule Commonly known as: NEXIUM Take 20 mg by mouth every morning.   fluocinonide 0.05 % external solution Commonly known as: LIDEX Apply 1 application topically See admin instructions. Once every three days   gabapentin 100 MG capsule Commonly known as: NEURONTIN Take 1-2 capsules (100-200 mg total) by mouth 3 (three) times daily as needed.   insulin pump Soln Inject into the skin continuous. Novolog insulin   ketoconazole 2 % shampoo Commonly known as: NIZORAL APPLY 1 APPLICATION TOPICALLY 2 (TWO) TIMES A WEEK.   lidocaine-prilocaine cream Commonly known as: EMLA Apply 1 application topically every 2 (two) hours as needed. Please run with good Rx discount coupon   Melatonin 5 MG Chew Chew 5 mg by mouth daily as needed (Sleep).   nitroGLYCERIN 0.4 MG/SPRAY spray Commonly known as: NITROLINGUAL Place 1 spray under the tongue every 5 (five) minutes x 3 doses as needed for chest pain.   NovoLOG 100 UNIT/ML injection Generic drug: insulin aspart Inject 40 Units into the skin daily.   ondansetron 4 MG tablet Commonly known as: Zofran Take 1 tablet (4 mg total) by mouth every 8 (eight) hours as needed for nausea or vomiting.   OVER THE COUNTER MEDICATION as directed. Magnesium L-Treonate   oxycodone 5 MG capsule Commonly known as: OXY-IR Take 1 capsule (5 mg total) by mouth every 4 (four) hours as needed.   promethazine 25 MG tablet Commonly known as: PHENERGAN Take 1 tablet (25 mg total) by mouth every 6 (six) hours as needed for nausea or vomiting.   spironolactone 25 MG tablet Commonly known as: ALDACTONE Take 50 mg by mouth daily.   torsemide 20 MG tablet Commonly known  as: [name] TAKE 1 TABLET ONCE DAILY INTHE MORNING. MAY TAKE AN   EXTRA 1/2 TABLET AS        NEEDED What changed: See the new instructions.   valACYclovir 500 MG tablet Commonly known as: VALTREX Take 500 mg by mouth daily as needed (Flair up).        Allergies:  Allergies  Allergen Reactions   Morphine Anaphylaxis and Shortness Of Breath   Penicillins Hives    Tolerated ANCEF on 04/30/20 Has patient had a PCN reaction causing immediate rash, facial/tongue/throat swelling, SOB or lightheadedness with hypotension: Yes Has patient had a PCN reaction causing severe rash involving mucus membranes or skin necrosis: No Has patient had a PCN reaction that required hospitalization No Has patient had a PCN reaction occurring within the last 10 years: No If all of the  above answers are "NO", then may proceed with Cephalosporin use.   Tramadol Anaphylaxis   Acetaminophen Other (See Comments)    Alters insulin pump readings    Amitriptyline Other (See Comments)    Severe headache/ out of body feeling   Codeine Other (See Comments)    Severe headaches/ out of body feeling    Ibuprofen Other (See Comments)    Messes up CGM reading on glucose monitor     Losartan Cough   Propoxyphene Other (See Comments)    Severe headaches / out of body feeling    Shellfish Allergy Diarrhea and Nausea And Vomiting   Statins Other (See Comments)    Muscle pains Other reaction(s): Other   Sulfamethoxazole Other (See Comments)    Mouth ulcers    Krill Oil Diarrhea, Itching and Nausea And Vomiting   Fluconazole     Other reaction(s): Contact Dermatitis (intolerance)   Norvasc [Amlodipine Besylate] Swelling    Past Medical History, Surgical history, Social history, and Family History were reviewed and updated.  Review of Systems: Review of Systems  Constitutional:  Positive for malaise/fatigue.  HENT: Negative.    Eyes: Negative.   Respiratory: Negative.    Cardiovascular:  Positive for leg  swelling.  Gastrointestinal:  Positive for abdominal pain.  Genitourinary: Negative.   Musculoskeletal:  Positive for joint pain and myalgias.  Skin:  Positive for rash.  Neurological: Negative.   Endo/Heme/Allergies: Negative.   Psychiatric/Behavioral: Negative.      Physical Exam:  weight is 95 lb 8 oz (43.3 kg). Her oral temperature is 97.7 F (36.5 C). Her blood pressure is 169/85 (abnormal) and her pulse is 60. Her respiration is 18 and oxygen saturation is 100%.   Wt Readings from Last 3 Encounters:  12/24/20 95 lb 8 oz (43.3 kg)  10/22/20 98 lb (44.5 kg)  09/08/20 97 lb (44 kg)    Physical Exam Vitals reviewed.  HENT:     Head: Normocephalic and atraumatic.  Eyes:     Pupils: Pupils are equal, round, and reactive to light.  Cardiovascular:     Rate and Rhythm: Normal rate and regular rhythm.     Heart sounds: Normal heart sounds.  Pulmonary:     Effort: Pulmonary effort is normal.     Breath sounds: Normal breath sounds.  Abdominal:     General: Bowel sounds are normal.     Palpations: Abdomen is soft.  Musculoskeletal:        General: No tenderness or deformity. Normal range of motion.     Cervical back: Normal range of motion.     Comments: On her extremities, there is some edema in her legs.  She has about 2+ edema.  She has a macular type reddish rash in the lower extremities.  This is in the areas above her ankles.  There is a couple areas that might be a little bit open.  Lymphadenopathy:     Cervical: No cervical adenopathy.  Skin:    General: Skin is warm and dry.     Findings: No erythema or rash.  Neurological:     Mental Status: She is alert and oriented to person, place, and time.  Psychiatric:        Behavior: Behavior normal.        Thought Content: Thought content normal.        Judgment: Judgment normal.    Lab Results  Component Value Date   WBC 6.0 12/24/2020   HGB 10.6 (L)  12/24/2020   HCT 32.3 (L) 12/24/2020   MCV 90.5 12/24/2020    PLT 439 (H) 12/24/2020   Lab Results  Component Value Date   FERRITIN 128 09/29/2020   IRON 105 09/29/2020   TIBC 249 09/29/2020   UIBC 143 09/29/2020   IRONPCTSAT 42 09/29/2020   Lab Results  Component Value Date   RETICCTPCT 1.1 12/24/2020   RBC 3.57 (L) 12/24/2020   RBC 3.63 (L) 12/24/2020   No results found for: KPAFRELGTCHN, LAMBDASER, KAPLAMBRATIO No results found for: IGGSERUM, IGA, IGMSERUM No results found for: Kathrynn Ducking, MSPIKE, SPEI   Chemistry      Component Value Date/Time   NA 131 (L) 12/24/2020 1000   NA 138 07/17/2017 1128   K 4.9 12/24/2020 1000   CL 97 (L) 12/24/2020 1000   CO2 27 12/24/2020 1000   BUN 28 (H) 12/24/2020 1000   BUN 23 07/17/2017 1128   CREATININE 1.54 (H) 12/24/2020 1000   CREATININE 0.99 07/20/2018 0821      Component Value Date/Time   CALCIUM 9.3 12/24/2020 1000   ALKPHOS 123 12/24/2020 1000   AST 23 12/24/2020 1000   ALT 17 12/24/2020 1000   BILITOT 0.5 12/24/2020 1000       Impression and Plan: Brittney Pace is a very pleasant 65 yo female with multifactorial anemia.  I am happy that her anemia is not a problem today.  This continues to improve.  We will give her a dose of Aranesp today.  I am sure that her iron studies should be okay.  Her last iron studies a month ago showed a ferritin of 128 with iron saturation of 42%.  I think we now get her back in about 6 weeks or so.  Hopefully, the elbow will be healing.   [name], MD 12/1/202210:42 AM

## 2020-12-24 NOTE — Telephone Encounter (Signed)
-----   Message from [name], MD sent at 12/24/2020  1:28 PM EST ----- Call - the iron level is ok!!  Brittney Pace

## 2020-12-24 NOTE — Patient Instructions (Signed)
Darbepoetin Alfa injection ?What is this medication? ?DARBEPOETIN ALFA (dar be POE e tin  AL fa) helps your body make more red blood cells. It is used to treat anemia caused by chronic kidney failure and chemotherapy. ?This medicine may be used for other purposes; ask your health care provider or pharmacist if you have questions. ?COMMON BRAND NAME(S): Aranesp ?What should I tell my care team before I take this medication? ?They need to know if you have any of these conditions: ?blood clotting disorders or history of blood clots ?cancer patient not on chemotherapy ?cystic fibrosis ?heart disease, such as angina, heart failure, or a history of a heart attack ?hemoglobin level of 12 g/dL or greater ?high blood pressure ?low levels of folate, iron, or vitamin B12 ?seizures ?an unusual or allergic reaction to darbepoetin, erythropoietin, albumin, hamster proteins, latex, other medicines, foods, dyes, or preservatives ?pregnant or trying to get pregnant ?breast-feeding ?How should I use this medication? ?This medicine is for injection into a vein or under the skin. It is usually given by a health care professional in a hospital or clinic setting. ?If you get this medicine at home, you will be taught how to prepare and give this medicine. Use exactly as directed. Take your medicine at regular intervals. Do not take your medicine more often than directed. ?It is important that you put your used needles and syringes in a special sharps container. Do not put them in a trash can. If you do not have a sharps container, call your pharmacist or healthcare provider to get one. ?A special MedGuide will be given to you by the pharmacist with each prescription and refill. Be sure to read this information carefully each time. ?Talk to your pediatrician regarding the use of this medicine in children. While this medicine may be used in children as young as 1 month of age for selected conditions, precautions do apply. ?Overdosage: If  you think you have taken too much of this medicine contact a poison control center or emergency room at once. ?NOTE: This medicine is only for you. Do not share this medicine with others. ?What if I miss a dose? ?If you miss a dose, take it as soon as you can. If it is almost time for your next dose, take only that dose. Do not take double or extra doses. ?What may interact with this medication? ?Do not take this medicine with any of the following medications: ?epoetin alfa ?This list may not describe all possible interactions. Give your health care provider a list of all the medicines, herbs, non-prescription drugs, or dietary supplements you use. Also tell them if you smoke, drink alcohol, or use illegal drugs. Some items may interact with your medicine. ?What should I watch for while using this medication? ?Your condition will be monitored carefully while you are receiving this medicine. ?You may need blood work done while you are taking this medicine. ?This medicine may cause a decrease in vitamin B6. You should make sure that you get enough vitamin B6 while you are taking this medicine. Discuss the foods you eat and the vitamins you take with your health care professional. ?What side effects may I notice from receiving this medication? ?Side effects that you should report to your doctor or health care professional as soon as possible: ?allergic reactions like skin rash, itching or hives, swelling of the face, lips, or tongue ?breathing problems ?changes in vision ?chest pain ?confusion, trouble speaking or understanding ?feeling faint or lightheaded, falls ?high blood   pressure ?muscle aches or pains ?pain, swelling, warmth in the leg ?rapid weight gain ?severe headaches ?sudden numbness or weakness of the face, arm or leg ?trouble walking, dizziness, loss of balance or coordination ?seizures (convulsions) ?swelling of the ankles, feet, hands ?unusually weak or tired ?Side effects that usually do not require  medical attention (report to your doctor or health care professional if they continue or are bothersome): ?diarrhea ?fever, chills (flu-like symptoms) ?headaches ?nausea, vomiting ?redness, stinging, or swelling at site where injected ?This list may not describe all possible side effects. Call your doctor for medical advice about side effects. You may report side effects to FDA at 1-800-FDA-1088. ?Where should I keep my medication? ?Keep out of the reach of children. ?Store in a refrigerator between 2 and 8 degrees C (36 and 46 degrees F). Do not freeze. Do not shake. Throw away any unused portion if using a single-dose vial. Throw away any unused medicine after the expiration date. ?NOTE: This sheet is a summary. It may not cover all possible information. If you have questions about this medicine, talk to your doctor, pharmacist, or health care provider. ?? 2022 [employer] Standard (2017-01-30 00:00:00) ? ?

## 2020-12-25 ENCOUNTER — Encounter: Payer: Self-pay | Admitting: *Deleted

## 2020-12-29 ENCOUNTER — Other Ambulatory Visit: Payer: Self-pay | Admitting: Cardiology

## 2020-12-29 DIAGNOSIS — I2510 Atherosclerotic heart disease of native coronary artery without angina pectoris: Secondary | ICD-10-CM

## 2021-01-28 NOTE — Progress Notes (Signed)
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  Brittney Pace

## 2021-02-01 IMAGING — DX DG FOOT COMPLETE 3+V*L*
3 series · 3 of 3 positions shown · non-contrast
Comparison: August 22, 2017

CLINICAL DATA: Pain posteriorly

EXAM:
LEFT FOOT - COMPLETE 3+ VIEW

[foot ap]
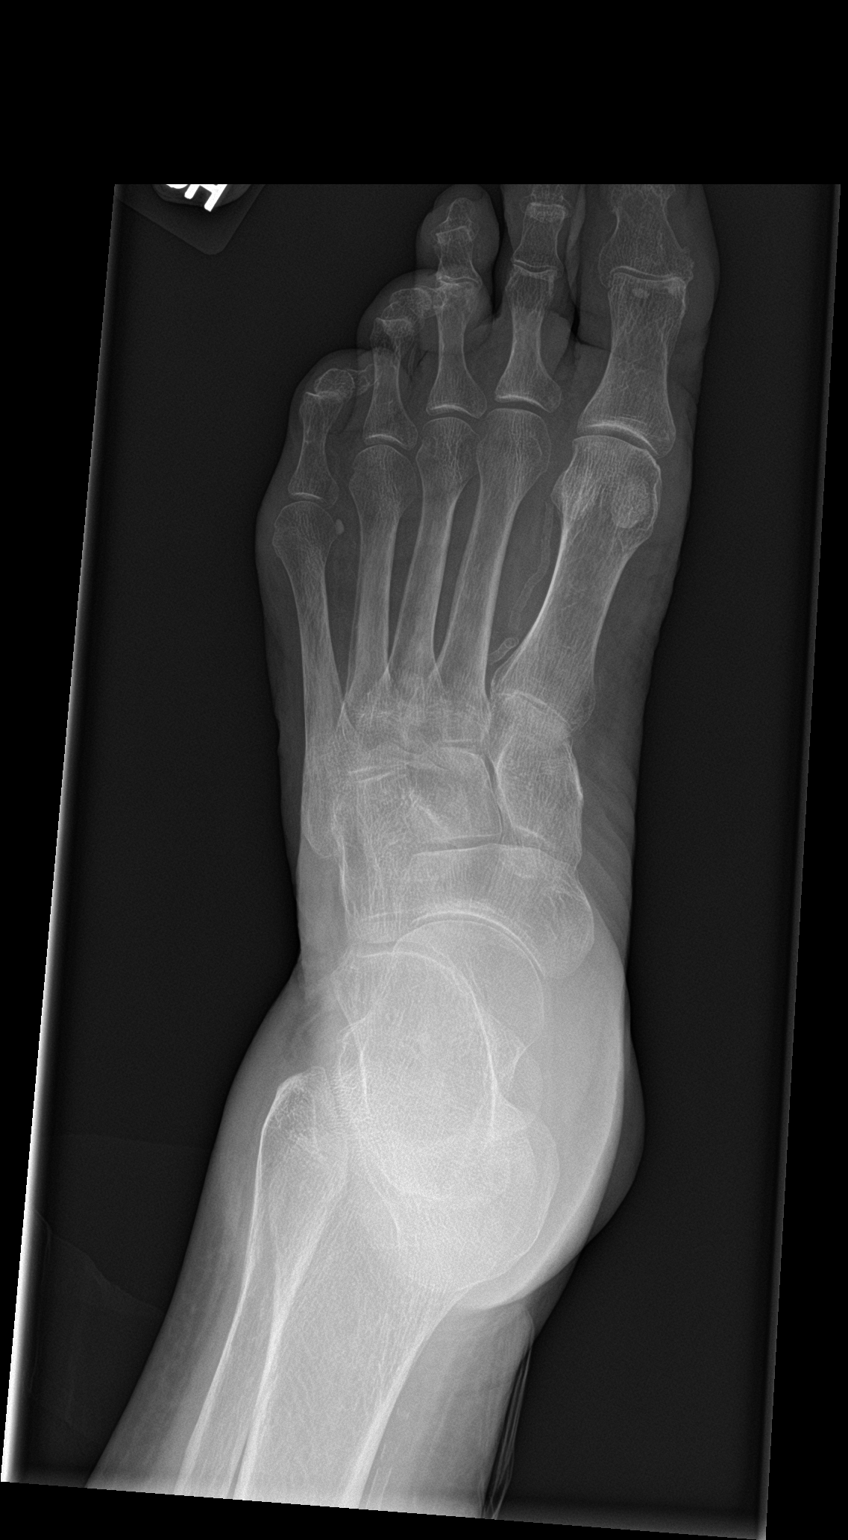

[foot obl]
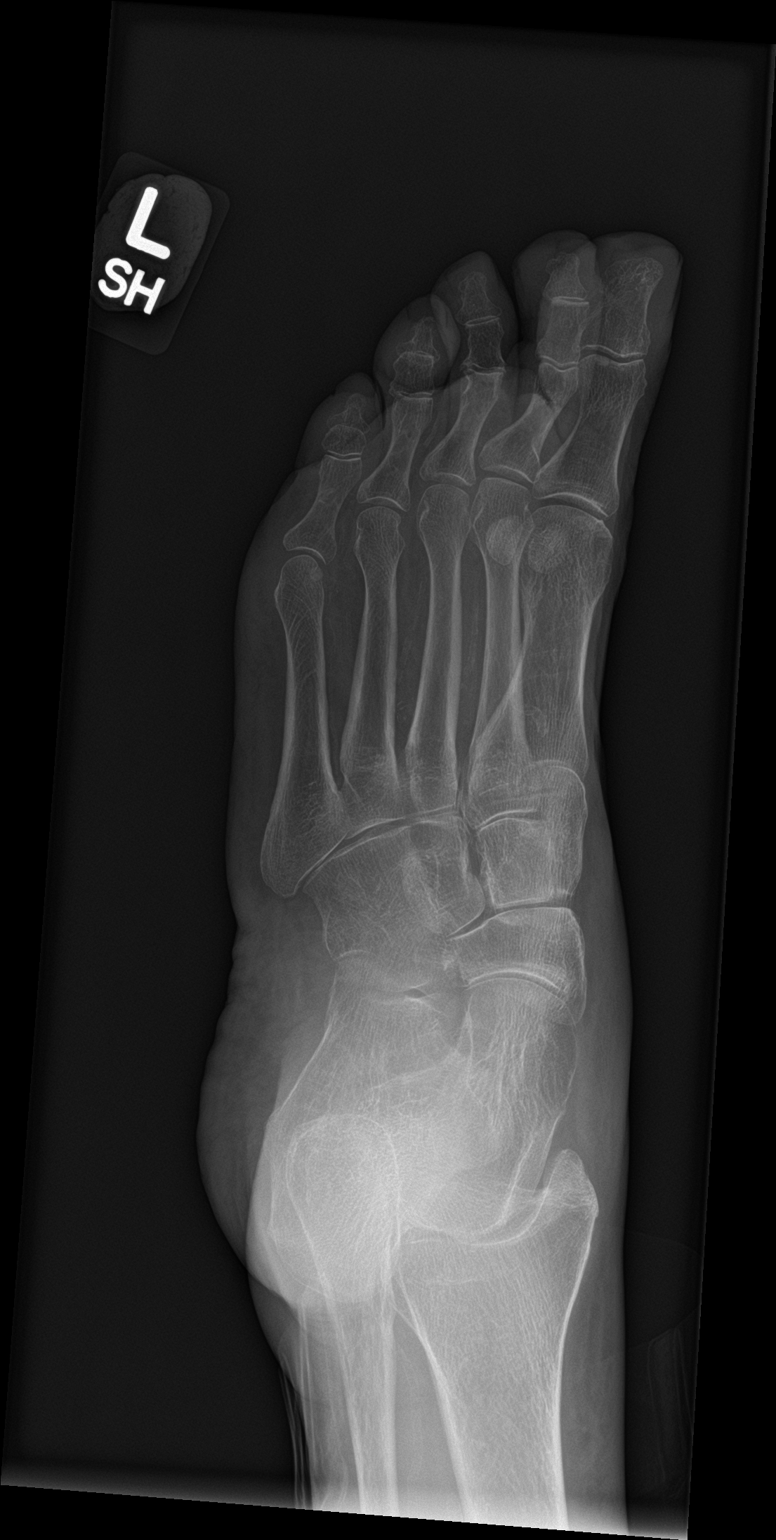

[foot lat]
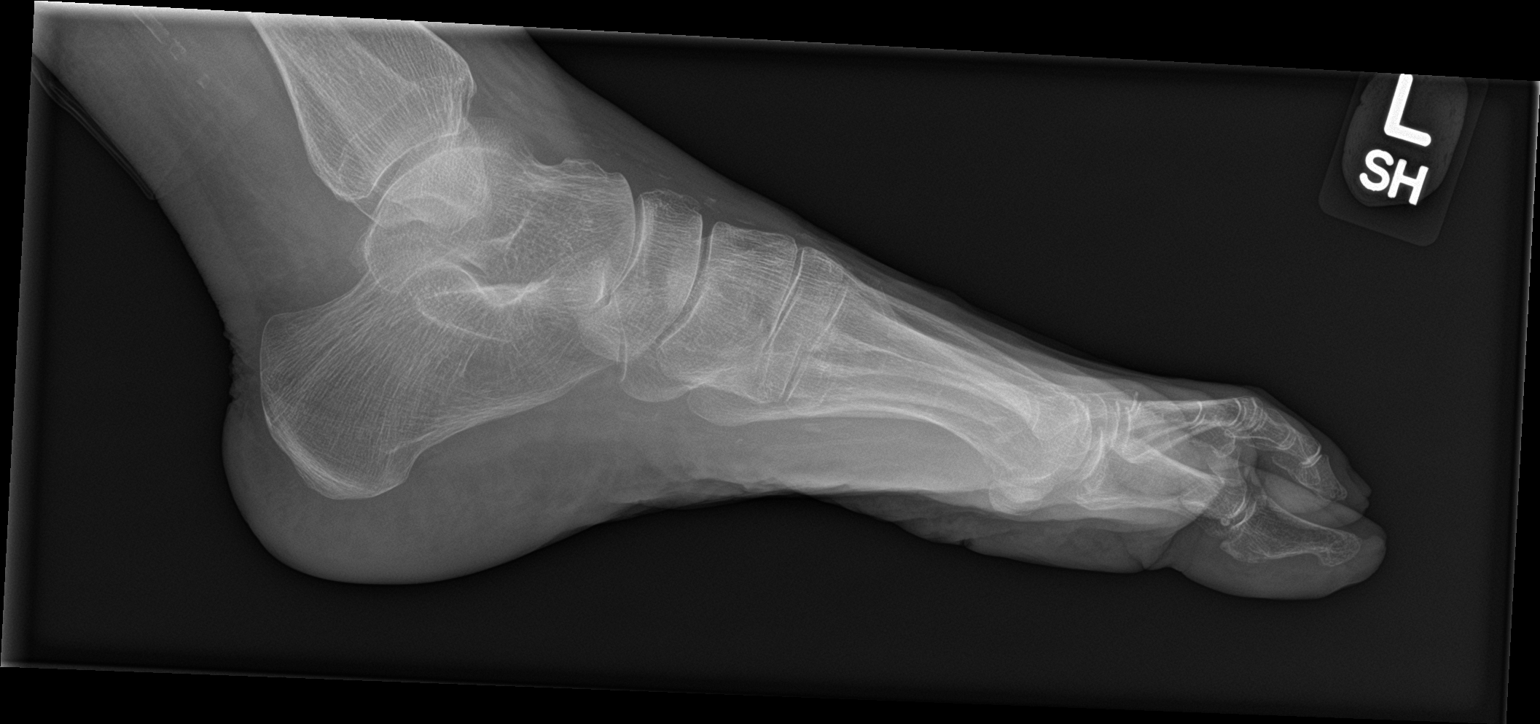

[3 of 3 positions shown; findings below may reference images not displayed]

FINDINGS: Frontal, oblique, and lateral views were obtained. No evident
fracture or dislocation. No calcaneal spurs are evident. Joint
spaces appear unremarkable. No erosive changes. There are foci of
arterial vascular calcification.
IMPRESSION: Foci of arterial vascular calcification. No fracture or dislocation.
No appreciable arthropathic change. No calcaneal spurs evident.

## 2021-02-08 ENCOUNTER — Inpatient Hospital Stay: Payer: Medicare Other

## 2021-02-08 ENCOUNTER — Inpatient Hospital Stay: Payer: Medicare Other | Attending: Hematology & Oncology

## 2021-02-08 ENCOUNTER — Other Ambulatory Visit: Payer: Self-pay

## 2021-02-08 ENCOUNTER — Encounter: Payer: Self-pay | Admitting: Hematology & Oncology

## 2021-02-08 ENCOUNTER — Inpatient Hospital Stay (HOSPITAL_BASED_OUTPATIENT_CLINIC_OR_DEPARTMENT_OTHER): Payer: Medicare Other | Admitting: Hematology & Oncology

## 2021-02-08 VITALS — BP 194/97 | HR 57 | Temp 97.3°F | Resp 18 | Ht 59.0 in | Wt 102.1 lb

## 2021-02-08 DIAGNOSIS — Z888 Allergy status to other drugs, medicaments and biological substances status: Secondary | ICD-10-CM | POA: Insufficient documentation

## 2021-02-08 DIAGNOSIS — Z8673 Personal history of transient ischemic attack (TIA), and cerebral infarction without residual deficits: Secondary | ICD-10-CM | POA: Diagnosis not present

## 2021-02-08 DIAGNOSIS — Z881 Allergy status to other antibiotic agents status: Secondary | ICD-10-CM | POA: Insufficient documentation

## 2021-02-08 DIAGNOSIS — Z885 Allergy status to narcotic agent status: Secondary | ICD-10-CM | POA: Diagnosis not present

## 2021-02-08 DIAGNOSIS — R030 Elevated blood-pressure reading, without diagnosis of hypertension: Secondary | ICD-10-CM | POA: Insufficient documentation

## 2021-02-08 DIAGNOSIS — R21 Rash and other nonspecific skin eruption: Secondary | ICD-10-CM | POA: Diagnosis not present

## 2021-02-08 DIAGNOSIS — Z79899 Other long term (current) drug therapy: Secondary | ICD-10-CM

## 2021-02-08 DIAGNOSIS — M791 Myalgia, unspecified site: Secondary | ICD-10-CM | POA: Diagnosis not present

## 2021-02-08 DIAGNOSIS — Z883 Allergy status to other anti-infective agents status: Secondary | ICD-10-CM | POA: Diagnosis not present

## 2021-02-08 DIAGNOSIS — R5383 Other fatigue: Secondary | ICD-10-CM | POA: Insufficient documentation

## 2021-02-08 DIAGNOSIS — Z794 Long term (current) use of insulin: Secondary | ICD-10-CM | POA: Insufficient documentation

## 2021-02-08 DIAGNOSIS — D631 Anemia in chronic kidney disease: Secondary | ICD-10-CM

## 2021-02-08 DIAGNOSIS — E1122 Type 2 diabetes mellitus with diabetic chronic kidney disease: Secondary | ICD-10-CM | POA: Diagnosis not present

## 2021-02-08 DIAGNOSIS — R197 Diarrhea, unspecified: Secondary | ICD-10-CM | POA: Insufficient documentation

## 2021-02-08 DIAGNOSIS — R109 Unspecified abdominal pain: Secondary | ICD-10-CM | POA: Insufficient documentation

## 2021-02-08 DIAGNOSIS — D500 Iron deficiency anemia secondary to blood loss (chronic): Secondary | ICD-10-CM

## 2021-02-08 DIAGNOSIS — D460 Refractory anemia without ring sideroblasts, so stated: Secondary | ICD-10-CM | POA: Insufficient documentation

## 2021-02-08 DIAGNOSIS — Z882 Allergy status to sulfonamides status: Secondary | ICD-10-CM | POA: Diagnosis not present

## 2021-02-08 DIAGNOSIS — Z886 Allergy status to analgesic agent status: Secondary | ICD-10-CM | POA: Diagnosis not present

## 2021-02-08 DIAGNOSIS — N189 Chronic kidney disease, unspecified: Secondary | ICD-10-CM

## 2021-02-08 DIAGNOSIS — R399 Unspecified symptoms and signs involving the genitourinary system: Secondary | ICD-10-CM

## 2021-02-08 DIAGNOSIS — N181 Chronic kidney disease, stage 1: Secondary | ICD-10-CM

## 2021-02-08 DIAGNOSIS — N1831 Chronic kidney disease, stage 3a: Secondary | ICD-10-CM

## 2021-02-08 DIAGNOSIS — Z880 Allergy status to penicillin: Secondary | ICD-10-CM | POA: Insufficient documentation

## 2021-02-08 DIAGNOSIS — M7989 Other specified soft tissue disorders: Secondary | ICD-10-CM | POA: Insufficient documentation

## 2021-02-08 DIAGNOSIS — M2550 Pain in unspecified joint: Secondary | ICD-10-CM | POA: Insufficient documentation

## 2021-02-08 DIAGNOSIS — E860 Dehydration: Secondary | ICD-10-CM

## 2021-02-08 LAB — URINALYSIS, COMPLETE (UACMP) WITH MICROSCOPIC
Bilirubin Urine: NEGATIVE
Glucose, UA: NEGATIVE mg/dL
Ketones, ur: NEGATIVE mg/dL
Leukocytes,Ua: NEGATIVE
Nitrite: NEGATIVE
Protein, ur: 100 mg/dL — AB
Specific Gravity, Urine: 1.015 (ref 1.005–1.030)
pH: 6.5 (ref 5.0–8.0)

## 2021-02-08 LAB — RETICULOCYTES
Immature Retic Fract: 4.4 % (ref 2.3–15.9)
RBC.: 3.71 MIL/uL — ABNORMAL LOW (ref 3.87–5.11)
Retic Count, Absolute: 28.6 10*3/uL (ref 19.0–186.0)
Retic Ct Pct: 0.8 % (ref 0.4–3.1)

## 2021-02-08 LAB — CMP (CANCER CENTER ONLY)
ALT: 14 U/L (ref 0–44)
AST: 25 U/L (ref 15–41)
Albumin: 3.7 g/dL (ref 3.5–5.0)
Alkaline Phosphatase: 80 U/L (ref 38–126)
Anion gap: 8 (ref 5–15)
BUN: 42 mg/dL — ABNORMAL HIGH (ref 8–23)
CO2: 23 mmol/L (ref 22–32)
Calcium: 9.3 mg/dL (ref 8.9–10.3)
Chloride: 101 mmol/L (ref 98–111)
Creatinine: 1.64 mg/dL — ABNORMAL HIGH (ref 0.44–1.00)
GFR, Estimated: 34 mL/min — ABNORMAL LOW (ref >60)
Glucose, Bld: 147 mg/dL — ABNORMAL HIGH (ref 70–99)
Potassium: 5.5 mmol/L — ABNORMAL HIGH (ref 3.5–5.1)
Sodium: 132 mmol/L — ABNORMAL LOW (ref 135–145)
Total Bilirubin: 0.3 mg/dL (ref 0.3–1.2)
Total Protein: 6.4 g/dL — ABNORMAL LOW (ref 6.5–8.1)

## 2021-02-08 LAB — CBC WITH DIFFERENTIAL (CANCER CENTER ONLY)
Abs Immature Granulocytes: 0.11 10*3/uL — ABNORMAL HIGH (ref 0.00–0.07)
Basophils Absolute: 0.1 10*3/uL (ref 0.0–0.1)
Basophils Relative: 1 %
Eosinophils Absolute: 0.4 10*3/uL (ref 0.0–0.5)
Eosinophils Relative: 5 %
HCT: 33.9 % — ABNORMAL LOW (ref 36.0–46.0)
Hemoglobin: 10.9 g/dL — ABNORMAL LOW (ref 12.0–15.0)
Immature Granulocytes: 1 %
Lymphocytes Relative: 19 %
Lymphs Abs: 1.7 10*3/uL (ref 0.7–4.0)
MCH: 29.8 pg (ref 26.0–34.0)
MCHC: 32.2 g/dL (ref 30.0–36.0)
MCV: 92.6 fL (ref 80.0–100.0)
Monocytes Absolute: 0.7 10*3/uL (ref 0.1–1.0)
Monocytes Relative: 8 %
Neutro Abs: 5.8 10*3/uL (ref 1.7–7.7)
Neutrophils Relative %: 66 %
Platelet Count: 343 10*3/uL (ref 150–400)
RBC: 3.66 MIL/uL — ABNORMAL LOW (ref 3.87–5.11)
RDW: 14.3 % (ref 11.5–15.5)
WBC Count: 8.7 10*3/uL (ref 4.0–10.5)
nRBC: 0 % (ref 0.0–0.2)

## 2021-02-08 MED ORDER — DARBEPOETIN ALFA 300 MCG/0.6ML IJ SOSY
300.0000 ug | PREFILLED_SYRINGE | Freq: Once | INTRAMUSCULAR | Status: DC
  Filled 2021-02-08: qty 0.6

## 2021-02-08 NOTE — Progress Notes (Signed)
Hematology and Oncology Follow Up Visit  Brittney Pace [id number] 09-03-1955 66 y.o. 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  She had decent time over the Christmas holiday.  Her blood sugars have been fluctuating a little bit.  She apparently has a new CGM which she does not think is working all that well.  She does have high blood pressure today.  Blood pressure is 194/97.  We will not be able to give her any Aranesp..    She thinks she may have a urinary tract infection.  Back in November, she had a Klebsiella urinary tract infection.  I am not sure this was treated.  I will send off another urinalysis on her and let us see what it shows.  She has been having little bit of diarrhea.  She is on Lomotil for this.  Her blood sugars have been doing a bit better.  She did have issues with her left elbow when we last saw her.  This appears to be doing a little bit better.  Her last iron studies back in December showed a ferritin of 254 with an iron saturation of 28%.  Overall, I would say that her performance status is ECOG 2.   Medications:  Allergies as of 02/08/2021       Reactions   Morphine Anaphylaxis, Shortness Of Breath   Penicillins Hives   Tolerated ANCEF on 04/30/20 Has patient had a PCN reaction causing immediate rash, facial/tongue/throat swelling, SOB or lightheadedness with hypotension: Yes Has patient had a PCN reaction causing severe rash involving mucus membranes or skin necrosis: No Has patient had a PCN reaction that required hospitalization No Has patient had a PCN reaction occurring within the last 10 years: No If all of the above answers are "NO", then may proceed with Cephalosporin use.   Tramadol Anaphylaxis   Acetaminophen  Other (See Comments)   Alters insulin pump readings   Amitriptyline Other (See Comments)   Severe headache/ out of body feeling   Codeine Other (See Comments)   Severe headaches/ out of body feeling   Ibuprofen Other (See Comments)   Messes up CGM reading on glucose monitor   Krill Oil Diarrhea, Itching, Nausea And Vomiting   Losartan Cough   Propoxyphene Other (See Comments)   Severe headaches / out of body feeling   Shellfish Allergy Diarrhea, Nausea And Vomiting   Statins Other (See Comments)   Muscle pains Other reaction(s): Other   Sulfamethoxazole Other (See Comments)   Mouth ulcers   Fluconazole    Other reaction(s): Contact Dermatitis (intolerance)   Norvasc [amlodipine Besylate] Swelling        Medication List        Accurate as of February 08, 2021  2:40 PM. If you have any questions, ask your nurse or doctor.          STOP taking these medications    cephALEXin 250 MG capsule Commonly known as: KEFLEX Stopped by: [name], MD   Clenpiq 10-3.5-12 MG-GM -GM/160ML Soln Generic drug: Sod Picosulfate-Mag Ox-Cit Acd Stopped by: [name], MD   ketoconazole 2 % shampoo Commonly known as: NIZORAL Stopped by: [name], MD   oxycodone 5 MG capsule Commonly known as:  OXY-IR Stopped by: [name], MD       TAKE these medications    AMBULATORY NON FORMULARY MEDICATION Incontinence pads per patient preference, #unlimited, refill x99 Fax to [phone]   Armodafinil 250 MG tablet 1 tab po q day   Armour Thyroid 60 MG tablet Generic drug: thyroid Take 1 tablet (60 mg total) by mouth daily before breakfast.   aspirin EC 325 MG tablet Take 1 tablet (325 mg total) by mouth daily. What changed: when to take this   carvedilol 12.5 MG tablet Commonly known as: COREG TAKE 1 TABLET BY MOUTH TWICE DAILY WITH MEALS   Creon 3000-9500 units Cpep Generic drug: Pancrelipase (Lip-Prot-Amyl) Take 4 capsules by mouth 3 (three) times  daily.   cyclobenzaprine 10 MG tablet Commonly known as: FLEXERIL Take 0.5-1 tablets (5-10 mg total) by mouth 3 (three) times daily as needed for muscle spasms. Caution: can cause drowsiness   Dexcom G6 Sensor Misc USE TO CONTINUOUSLY MONITOR BLOOD SUGARS. CHANGE EVERY 10 DAYS   diclofenac Sodium 1 % Gel Commonly known as: VOLTAREN Apply 2 g topically daily as needed (Pain).   diphenhydrAMINE 25 MG tablet Commonly known as: BENADRYL Take 25 mg by mouth at bedtime.   diphenoxylate-atropine 2.5-0.025 MG tablet Commonly known as: LOMOTIL TAKE 1 TABLET BY MOUTH 4 TIMES DAILY AS NEEDED FOR DIARRHEA OR  LOOSE  STOOLS   Emgality 120 MG/ML Soaj Generic drug: Galcanezumab-gnlm Inject 120 mg into the skin every 30 (thirty) days.   esomeprazole 20 MG capsule Commonly known as: NEXIUM Take 20 mg by mouth every morning.   fluocinonide 0.05 % external solution Commonly known as: LIDEX Apply 1 application topically See admin instructions. Once every three days   gabapentin 100 MG capsule Commonly known as: NEURONTIN Take 1-2 capsules (100-200 mg total) by mouth 3 (three) times daily as needed.   insulin pump Soln Inject into the skin continuous. Novolog insulin   lidocaine-prilocaine cream Commonly known as: EMLA Apply 1 application topically every 2 (two) hours as needed. Please run with good Rx discount coupon   Melatonin 5 MG Chew Chew 5 mg by mouth daily as needed (Sleep).   nitroGLYCERIN 0.4 MG/SPRAY spray Commonly known as: NITROLINGUAL Place 1 spray under the tongue every 5 (five) minutes x 3 doses as needed for chest pain.   NovoLOG 100 UNIT/ML injection Generic drug: insulin aspart Inject 40 Units into the skin daily.   ondansetron 4 MG tablet Commonly known as: Zofran Take 1 tablet (4 mg total) by mouth every 8 (eight) hours as needed for nausea or vomiting.   OVER THE COUNTER MEDICATION as directed. Magnesium L-Treonate   promethazine 25 MG tablet Commonly  known as: PHENERGAN Take 1 tablet (25 mg total) by mouth every 6 (six) hours as needed for nausea or vomiting.   spironolactone 25 MG tablet Commonly known as: ALDACTONE Take 50 mg by mouth daily.   torsemide 20 MG tablet Commonly known as: [name] TAKE 1 TABLET ONCE DAILY INTHE MORNING. MAY TAKE AN   EXTRA 1/2 TABLET AS        NEEDED What changed: See the new instructions.   valACYclovir 500 MG tablet Commonly known as: VALTREX Take 500 mg by mouth daily as needed (Flair up).        Allergies:  Allergies  Allergen Reactions   Morphine Anaphylaxis and Shortness Of Breath   Penicillins Hives    Tolerated ANCEF on 04/30/20 Has patient had a PCN reaction causing immediate rash,  facial/tongue/throat swelling, SOB or lightheadedness with hypotension: Yes Has patient had a PCN reaction causing severe rash involving mucus membranes or skin necrosis: No Has patient had a PCN reaction that required hospitalization No Has patient had a PCN reaction occurring within the last 10 years: No If all of the above answers are "NO", then may proceed with Cephalosporin use.   Tramadol Anaphylaxis   Acetaminophen Other (See Comments)    Alters insulin pump readings    Amitriptyline Other (See Comments)    Severe headache/ out of body feeling   Codeine Other (See Comments)    Severe headaches/ out of body feeling    Ibuprofen Other (See Comments)    Messes up CGM reading on glucose monitor     Krill Oil Diarrhea, Itching and Nausea And Vomiting   Losartan Cough   Propoxyphene Other (See Comments)    Severe headaches / out of body feeling    Shellfish Allergy Diarrhea and Nausea And Vomiting   Statins Other (See Comments)    Muscle pains Other reaction(s): Other   Sulfamethoxazole Other (See Comments)    Mouth ulcers    Fluconazole     Other reaction(s): Contact Dermatitis (intolerance)   Norvasc [Amlodipine Besylate] Swelling    Past Medical History, Surgical history, Social  history, and Family History were reviewed and updated.  Review of Systems: Review of Systems  Constitutional:  Positive for malaise/fatigue.  HENT: Negative.    Eyes: Negative.   Respiratory: Negative.    Cardiovascular:  Positive for leg swelling.  Gastrointestinal:  Positive for abdominal pain.  Genitourinary: Negative.   Musculoskeletal:  Positive for joint pain and myalgias.  Skin:  Positive for rash.  Neurological: Negative.   Endo/Heme/Allergies: Negative.   Psychiatric/Behavioral: Negative.      Physical Exam:  height is 4\' 11"  (1.499 m) and weight is 102 lb 1.9 oz (46.3 kg). Her oral temperature is 97.3 F (36.3 C) (abnormal). Her blood pressure is 194/97 (abnormal) and her pulse is 57 (abnormal). Her respiration is 18 and oxygen saturation is 100%.   Wt Readings from Last 3 Encounters:  02/08/21 102 lb 1.9 oz (46.3 kg)  12/24/20 95 lb 8 oz (43.3 kg)  10/22/20 98 lb (44.5 kg)    Physical Exam Vitals reviewed.  HENT:     Head: Normocephalic and atraumatic.  Eyes:     Pupils: Pupils are equal, round, and reactive to light.  Cardiovascular:     Rate and Rhythm: Normal rate and regular rhythm.     Heart sounds: Normal heart sounds.  Pulmonary:     Effort: Pulmonary effort is normal.     Breath sounds: Normal breath sounds.  Abdominal:     General: Bowel sounds are normal.     Palpations: Abdomen is soft.  Musculoskeletal:        General: No tenderness or deformity. Normal range of motion.     Cervical back: Normal range of motion.     Comments: On her extremities, there is some edema in her legs.  She has about 2+ edema.  She has a macular type reddish rash in the lower extremities.  This is in the areas above her ankles.  There is a couple areas that might be a little bit open.  Lymphadenopathy:     Cervical: No cervical adenopathy.  Skin:    General: Skin is warm and dry.     Findings: No erythema or rash.  Neurological:     Mental Status: She  is alert and  oriented to person, place, and time.  Psychiatric:        Behavior: Behavior normal.        Thought Content: Thought content normal.        Judgment: Judgment normal.    Lab Results  Component Value Date   WBC 8.7 02/08/2021   HGB 10.9 (L) 02/08/2021   HCT 33.9 (L) 02/08/2021   MCV 92.6 02/08/2021   PLT 343 02/08/2021   Lab Results  Component Value Date   FERRITIN 254 12/24/2020   IRON 75 12/24/2020   TIBC 266 12/24/2020   UIBC 192 12/24/2020   IRONPCTSAT 28 12/24/2020   Lab Results  Component Value Date   RETICCTPCT 0.8 02/08/2021   RBC 3.71 (L) 02/08/2021   No results found for: KPAFRELGTCHN, LAMBDASER, KAPLAMBRATIO No results found for: IGGSERUM, IGA, IGMSERUM No results found for: Ronnald Ramp, A1GS, A2GS, Violet Baldy, MSPIKE, SPEI   Chemistry      Component Value Date/Time   NA 132 (L) 02/08/2021 1322   NA 138 07/17/2017 1128   K 5.5 (H) 02/08/2021 1322   CL 101 02/08/2021 1322   CO2 23 02/08/2021 1322   BUN 42 (H) 02/08/2021 1322   BUN 23 07/17/2017 1128   CREATININE 1.64 (H) 02/08/2021 1322   CREATININE 0.99 07/20/2018 0821      Component Value Date/Time   CALCIUM 9.3 02/08/2021 1322   ALKPHOS 80 02/08/2021 1322   AST 25 02/08/2021 1322   ALT 14 02/08/2021 1322   BILITOT 0.3 02/08/2021 1322       Impression and Plan: Ms. Marquart is a very pleasant 66 yo female with multifactorial anemia.   Her hemoglobin is holding steady.  Again, we really cannot give her any Aranesp because her blood pressure is so high.  We will check her urine to make sure there is no urinary tract infection.  We will see what her iron studies show.    Hopefully, she will feel better when we see her again.  I will plan to get her back to see [place] in about 6 weeks or so.   [name], MD 1/16/20232:40 PM

## 2021-02-08 NOTE — Progress Notes (Signed)
No injection due to hypertension

## 2021-02-09 ENCOUNTER — Other Ambulatory Visit: Payer: Self-pay | Admitting: Cardiology

## 2021-02-09 ENCOUNTER — Telehealth: Payer: Self-pay

## 2021-02-09 DIAGNOSIS — I2510 Atherosclerotic heart disease of native coronary artery without angina pectoris: Secondary | ICD-10-CM

## 2021-02-09 LAB — IRON AND IRON BINDING CAPACITY (CC-WL,HP ONLY)
Iron: 116 ug/dL (ref 28–170)
Saturation Ratios: 32 % — ABNORMAL HIGH (ref 10.4–31.8)
TIBC: 358 ug/dL (ref 250–450)
UIBC: 242 ug/dL (ref 148–442)

## 2021-02-09 LAB — FERRITIN: Ferritin: 122 ng/mL (ref 11–307)

## 2021-02-09 NOTE — Telephone Encounter (Signed)
-----   Message from [name], MD sent at 02/08/2021  5:47 PM EST ----- Please call her and let her know that the urinalysis looks okay.  I do not think there is any urinary tract infection.  Laurey Arrow

## 2021-02-10 ENCOUNTER — Telehealth: Payer: Self-pay

## 2021-02-10 DIAGNOSIS — N390 Urinary tract infection, site not specified: Secondary | ICD-10-CM

## 2021-02-10 LAB — URINE CULTURE: Culture: >=100000 — AB

## 2021-02-10 MED ORDER — CIPROFLOXACIN HCL 500 MG PO TABS: 500.0000 mg | ORAL_TABLET | Freq: Two times a day (BID) | ORAL | 0 refills | Status: AC

## 2021-02-10 NOTE — Telephone Encounter (Signed)
Called and informed patient of lab results, patient verbalized understanding and denies any questions or concerns at this time. Rx sent to requested pharmacy ([facility]).

## 2021-02-10 NOTE — Telephone Encounter (Signed)
-----   Message from [name], MD sent at 02/09/2021  8:38 PM EST ----- Call - she does have a UTI!!!  Klebsiella is growing.  Call in Cipro 500 mg po BID x 7 days. Brittney Pace

## 2021-02-11 ENCOUNTER — Telehealth: Payer: Self-pay

## 2021-02-11 NOTE — Telephone Encounter (Signed)
I called to follow up w/pt to see if they still would like to take the leqvio and they stated that they aren't interested at this time.

## 2021-02-26 NOTE — Progress Notes (Signed)
HPI:FU CAD. Patient previously followed at [facility]; H/O CABG (LIMA to LAD; SVG to RCA); According to the patient, she had 2 stents to the LIMA graft and the LAD in 2000. She's had EECP treatments in [place]. Her last heart catheterization was August 2015 at [facility] which revealed two-vessel coronary disease with a patent LIMA to the LAD and SVG to the RCA. No obstructive disease in left circumflex; EF 65. Last echo January 2020 showed normal LV function, systolic bowing of mitral valve without frank prolapse. Also with history of stroke. Since last seen, she denies dyspnea, palpitations or syncope.  She occasionally has chest pain for which she takes nitroglycerin which is unchanged compared to previous.  She has chronic mild pedal edema.  Current Outpatient Medications  Medication Sig Dispense Refill   AMBULATORY NON FORMULARY MEDICATION Incontinence pads per patient preference, #unlimited, refill x99 Fax to [phone] 1 Units prn   Armodafinil 250 MG tablet 1 tab po q day 30 tablet 1   ARMOUR THYROID 60 MG tablet Take 1 tablet (60 mg total) by mouth daily before breakfast. 90 tablet PRN   aspirin EC 325 MG tablet Take 1 tablet (325 mg total) by mouth daily. (Patient taking differently: Take 325 mg by mouth at bedtime.) 30 tablet 0   carvedilol (COREG) 12.5 MG tablet TAKE 1 TABLET BY MOUTH TWICE DAILY WITH MEALS 60 tablet 0   Continuous Blood Gluc Sensor (DEXCOM G6 SENSOR) MISC USE TO CONTINUOUSLY MONITOR BLOOD SUGARS. CHANGE EVERY 10 DAYS     cyclobenzaprine (FLEXERIL) 10 MG tablet Take 0.5-1 tablets (5-10 mg total) by mouth 3 (three) times daily as needed for muscle spasms. Caution: can cause drowsiness 40 tablet 1   diclofenac Sodium (VOLTAREN) 1 % GEL Apply 2 g topically daily as needed (Pain).     diphenhydrAMINE (BENADRYL) 25 MG tablet Take 25 mg by mouth at bedtime.     diphenoxylate-atropine (LOMOTIL) 2.5-0.025 MG tablet TAKE 1 TABLET BY MOUTH 4  TIMES DAILY AS NEEDED FOR DIARRHEA OR  LOOSE  STOOLS 45 tablet 0   esomeprazole (NEXIUM) 20 MG capsule Take 20 mg by mouth every morning.      fluocinonide (LIDEX) 0.05 % external solution Apply 1 application topically See admin instructions. Once every three days     gabapentin (NEURONTIN) 100 MG capsule Take 1-2 capsules (100-200 mg total) by mouth 3 (three) times daily as needed. 180 capsule 5   Galcanezumab-gnlm (EMGALITY) 120 MG/ML SOAJ Inject 120 mg into the skin every 30 (thirty) days. 1 mL 5   Insulin Human (INSULIN PUMP) SOLN Inject into the skin continuous. Novolog insulin     lidocaine-prilocaine (EMLA) cream Apply 1 application topically every 2 (two) hours as needed. Please run with good Rx discount coupon 30 g 0   Melatonin 5 MG CHEW Chew 5 mg by mouth daily as needed (Sleep).     nitroGLYCERIN (NITROLINGUAL) 0.4 MG/SPRAY spray Place 1 spray under the tongue every 5 (five) minutes x 3 doses as needed for chest pain. 12 g 1   NOVOLOG 100 UNIT/ML injection Inject 40 Units into the skin daily.     ondansetron (ZOFRAN) 4 MG tablet Take 1 tablet (4 mg total) by mouth every 8 (eight) hours as needed for nausea or vomiting. 10 tablet 0   OVER THE COUNTER MEDICATION as directed. Magnesium L-Treonate     Pancrelipase, Lip-Prot-Amyl, (CREON) 3000-9500 units CPEP Take 4 capsules by mouth 3 (three)  times daily.     promethazine (PHENERGAN) 25 MG tablet Take 1 tablet (25 mg total) by mouth every 6 (six) hours as needed for nausea or vomiting. 10 tablet 0   spironolactone (ALDACTONE) 25 MG tablet Take 50 mg by mouth daily.     torsemide ([name]) 20 MG tablet TAKE 1 TABLET ONCE DAILY INTHE MORNING. MAY TAKE AN   EXTRA 1/2 TABLET AS        NEEDED (Patient taking differently: Take 20 mg by mouth daily as needed (Water retention).) 135 tablet 0   valACYclovir (VALTREX) 500 MG tablet Take 500 mg by mouth daily as needed (Flair up).     No current facility-administered medications for this visit.      Past Medical History:  Diagnosis Date   Anemia    Arthritis    Babesiasis    secondary due to lyme disease   CHF (congestive heart failure) (HCC)    Chronic kidney disease    stage 3   Coronary artery disease    Depression    Diabetes mellitus without complication (HCC)    Type 1   Diabetic retinopathy ([place])    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   Family history of adverse reaction to anesthesia    mother: " while she was under she stopped breathing."   Fibromyalgia    Gastroparesis    GERD (gastroesophageal reflux disease)    Headache    migraines   Hypothyroidism    IBS (irritable bowel syndrome)    Idiopathic edema    Iron deficiency anemia 09/04/2019   Lyme disease    Mitral valve prolapse    Myocardial infarction ([place])    1 major in 1999 and 2 minor " small vessel disease."   Osteoporosis    Peripheral neuropathy    Peripheral vascular disease (HCC)    Sinus disorder    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   Stroke ([place])    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" " the second stroke was a lacunar     Past Surgical History:  Procedure Laterality Date   ABDOMINAL HYSTERECTOMY     APPENDECTOMY     BREAST SURGERY     B/L biopsy and lumpectomy    CARDIAC CATHETERIZATION     CARPAL TUNNEL RELEASE     CATARACT EXTRACTION W/ INTRAOCULAR LENS IMPLANT     right eye   COLONOSCOPY W/ BIOPSIES AND POLYPECTOMY     CORONARY ARTERY BYPASS GRAFT     coronary artery stents     at LAD and LIMA   ECTOPIC PREGNANCY SURGERY     NASAL SEPTUM SURGERY     OPEN REDUCTION INTERNAL FIXATION (ORIF) DISTAL RADIAL FRACTURE Right 05/06/2015   Procedure: OPEN REDUCTION INTERNAL FIXATION (ORIF) RIGHT DISTAL RADIAL FRACTURE AND REPAIRS AS NEEDED;  Surgeon: Iran Planas, MD;  Location: [facility];  Service: Orthopedics;  Laterality: Right;   ORIF HUMERUS FRACTURE Right 04/30/2020   Procedure: OPEN REDUCTION INTERNAL FIXATION (ORIF) PROXIMAL HUMERUS FRACTURE;  Surgeon: Justice Britain,  MD;  Location: [facility];  Service: Orthopedics;  Laterality: Right;  126min   ORIF WRIST FRACTURE Left 11/05/2014   ORIF WRIST FRACTURE Left 11/05/2014   Procedure: OPEN REDUCTION INTERNAL FIXATION (ORIF) LEFT WRIST FRACTURE AND REPAIR AS INDICATED;  Surgeon: Iran Planas, MD;  Location: [facility];  Service: Orthopedics;  Laterality: Left;   TRIGGER FINGER RELEASE      Social History   Socioeconomic History   Marital status: Married  Spouse name: Not on file   Number of children: 0   Years of education: college   Highest education level: Not on file  Occupational History   Occupation: Retired  Tobacco Use   Smoking status: Former    Types: Cigarettes   Smokeless tobacco: Never   Tobacco comments:     " Quit smoking cigarettes in 20's "  Vaping Use   Vaping Use: Never used  Substance and Sexual Activity   Alcohol use: Yes    Comment: occasional beer or wine   Drug use: No   Sexual activity: Not on file  Other Topics Concern   Not on file  Social History Narrative   Drinks 1-2  cups caffeine drinks a day    Right handed   Lives at home with husband and dog   Social Determinants of Health   Financial Resource Strain: Not on file  Food Insecurity: Not on file  Transportation Needs: Not on file  Physical Activity: Not on file  Stress: Not on file  Social Connections: Not on file  Intimate Partner Violence: Not on file    Family History  Problem Relation Age of Onset   Breast cancer Mother    Migraines Mother    Heart disease Father    Hypertension Father    Breast cancer Sister     ROS: Diarrhea but no fevers or chills, productive cough, hemoptysis, dysphasia, odynophagia, melena, hematochezia, dysuria, hematuria, rash, seizure activity, orthopnea, PND, claudication. Remaining systems are negative.  Physical Exam: Well-developed well-nourished in no acute distress.  Skin is warm and dry.  HEENT is normal.  Neck is supple.  Chest is clear to auscultation with  normal expansion.  Cardiovascular exam is regular rate and rhythm.  Abdominal exam nontender or distended. No masses palpated. Extremities show 1+ edema. neuro grossly intact  ECG-sinus bradycardia at a rate of 59, right axis deviation, no ST changes.  Personally reviewed  A/P  1 coronary artery disease-patient is symptomatically unchanged compared to previous.  She occasionally has atypical chest pain that is similar to previous.  We will continue aspirin.  Intolerant to statins.  2 hyperlipidemia-intolerant to statins and Zetia.  Patient has declined other lipid-lowering medications.  I again discussed the [place] and Praluent.  She will research these medications and contact us if she would be agreeable.  3 hypertension-blood pressure elevated.  Add amlodipine 5 mg daily and follow.  4 chronic diastolic congestive heart failure-she appears to be euvolemic.  We will continue [name] at present dose.  Kirk Ruths, MD

## 2021-03-10 ENCOUNTER — Ambulatory Visit (INDEPENDENT_AMBULATORY_CARE_PROVIDER_SITE_OTHER): Payer: Medicare Other | Admitting: Cardiology

## 2021-03-10 ENCOUNTER — Encounter: Payer: Self-pay | Admitting: *Deleted

## 2021-03-10 ENCOUNTER — Other Ambulatory Visit: Payer: Self-pay

## 2021-03-10 ENCOUNTER — Encounter: Payer: Self-pay | Admitting: Cardiology

## 2021-03-10 VITALS — BP 142/71 | HR 59 | Ht 59.0 in | Wt 101.0 lb

## 2021-03-10 DIAGNOSIS — I10 Essential (primary) hypertension: Secondary | ICD-10-CM

## 2021-03-10 DIAGNOSIS — I5032 Chronic diastolic (congestive) heart failure: Secondary | ICD-10-CM | POA: Diagnosis not present

## 2021-03-10 DIAGNOSIS — I2510 Atherosclerotic heart disease of native coronary artery without angina pectoris: Secondary | ICD-10-CM

## 2021-03-10 DIAGNOSIS — E7800 Pure hypercholesterolemia, unspecified: Secondary | ICD-10-CM

## 2021-03-10 DIAGNOSIS — R072 Precordial pain: Secondary | ICD-10-CM | POA: Diagnosis not present

## 2021-03-10 MED ORDER — AMLODIPINE BESYLATE 5 MG PO TABS: 5.0000 mg | ORAL_TABLET | Freq: Every day | ORAL | 3 refills | Status: DC

## 2021-03-10 MED ORDER — CARVEDILOL 12.5 MG PO TABS: 12.5000 mg | ORAL_TABLET | Freq: Two times a day (BID) | ORAL | 3 refills | Status: DC

## 2021-03-10 NOTE — Patient Instructions (Signed)
Medication Instructions:  ° °START AMLODIPINE 5 MG ONCE DAILY ° °*If you need a refill on your cardiac medications before your next appointment, please call your pharmacy* ° ° °Follow-Up: °At [facility], you and your health needs are our priority.  As part of our continuing mission to provide you with exceptional heart care, we have created designated Provider Care Teams.  These Care Teams include your primary Cardiologist (physician) and Advanced Practice Providers (APPs -  Physician Assistants and Nurse Practitioners) who all work together to provide you with the care you need, when you need it. ° °We recommend signing up for the patient portal called "MyChart".  Sign up information is provided on this After Visit Summary.  MyChart is used to connect with patients for Virtual Visits (Telemedicine).  Patients are able to view lab/test results, encounter notes, upcoming appointments, etc.  Non-urgent messages can be sent to your provider as well.   °To learn more about what you can do with MyChart, go to [email or web address].   ° °Your next appointment:   °6 month(s) ° °The format for your next appointment:   °In Person ° °Provider:   °Brian Crenshaw, MD   ° ° ° °

## 2021-03-18 ENCOUNTER — Inpatient Hospital Stay: Payer: Medicare Other | Attending: Hematology & Oncology

## 2021-03-18 ENCOUNTER — Encounter: Payer: Self-pay | Admitting: Hematology & Oncology

## 2021-03-18 ENCOUNTER — Other Ambulatory Visit: Payer: Self-pay

## 2021-03-18 ENCOUNTER — Inpatient Hospital Stay: Payer: Medicare Other

## 2021-03-18 ENCOUNTER — Inpatient Hospital Stay (HOSPITAL_BASED_OUTPATIENT_CLINIC_OR_DEPARTMENT_OTHER): Payer: Medicare Other | Admitting: Hematology & Oncology

## 2021-03-18 VITALS — BP 134/53 | HR 64 | Temp 98.0°F | Resp 18 | Ht 59.0 in | Wt 101.8 lb

## 2021-03-18 DIAGNOSIS — D500 Iron deficiency anemia secondary to blood loss (chronic): Secondary | ICD-10-CM

## 2021-03-18 DIAGNOSIS — Z8673 Personal history of transient ischemic attack (TIA), and cerebral infarction without residual deficits: Secondary | ICD-10-CM | POA: Diagnosis not present

## 2021-03-18 DIAGNOSIS — E1165 Type 2 diabetes mellitus with hyperglycemia: Secondary | ICD-10-CM | POA: Insufficient documentation

## 2021-03-18 DIAGNOSIS — Z794 Long term (current) use of insulin: Secondary | ICD-10-CM | POA: Insufficient documentation

## 2021-03-18 DIAGNOSIS — E1022 Type 1 diabetes mellitus with diabetic chronic kidney disease: Secondary | ICD-10-CM

## 2021-03-18 DIAGNOSIS — D631 Anemia in chronic kidney disease: Secondary | ICD-10-CM

## 2021-03-18 DIAGNOSIS — N183 Chronic kidney disease, stage 3 unspecified: Secondary | ICD-10-CM

## 2021-03-18 DIAGNOSIS — Z79899 Other long term (current) drug therapy: Secondary | ICD-10-CM | POA: Insufficient documentation

## 2021-03-18 DIAGNOSIS — D460 Refractory anemia without ring sideroblasts, so stated: Secondary | ICD-10-CM | POA: Insufficient documentation

## 2021-03-18 DIAGNOSIS — D638 Anemia in other chronic diseases classified elsewhere: Secondary | ICD-10-CM

## 2021-03-18 DIAGNOSIS — N1831 Chronic kidney disease, stage 3a: Secondary | ICD-10-CM

## 2021-03-18 LAB — CBC WITH DIFFERENTIAL (CANCER CENTER ONLY)
Abs Immature Granulocytes: 0.08 10*3/uL — ABNORMAL HIGH (ref 0.00–0.07)
Basophils Absolute: 0 10*3/uL (ref 0.0–0.1)
Basophils Relative: 1 %
Eosinophils Absolute: 0.3 10*3/uL (ref 0.0–0.5)
Eosinophils Relative: 3 %
HCT: 27.3 % — ABNORMAL LOW (ref 36.0–46.0)
Hemoglobin: 9 g/dL — ABNORMAL LOW (ref 12.0–15.0)
Immature Granulocytes: 1 %
Lymphocytes Relative: 14 %
Lymphs Abs: 1.1 10*3/uL (ref 0.7–4.0)
MCH: 29.9 pg (ref 26.0–34.0)
MCHC: 33 g/dL (ref 30.0–36.0)
MCV: 90.7 fL (ref 80.0–100.0)
Monocytes Absolute: 0.6 10*3/uL (ref 0.1–1.0)
Monocytes Relative: 7 %
Neutro Abs: 6.3 10*3/uL (ref 1.7–7.7)
Neutrophils Relative %: 74 %
Platelet Count: 302 10*3/uL (ref 150–400)
RBC: 3.01 MIL/uL — ABNORMAL LOW (ref 3.87–5.11)
RDW: 12.9 % (ref 11.5–15.5)
WBC Count: 8.4 10*3/uL (ref 4.0–10.5)
nRBC: 0 % (ref 0.0–0.2)

## 2021-03-18 LAB — CMP (CANCER CENTER ONLY)
ALT: 16 U/L (ref 0–44)
AST: 25 U/L (ref 15–41)
Albumin: 3.4 g/dL — ABNORMAL LOW (ref 3.5–5.0)
Alkaline Phosphatase: 80 U/L (ref 38–126)
Anion gap: 10 (ref 5–15)
BUN: 41 mg/dL — ABNORMAL HIGH (ref 8–23)
CO2: 21 mmol/L — ABNORMAL LOW (ref 22–32)
Calcium: 8.5 mg/dL — ABNORMAL LOW (ref 8.9–10.3)
Chloride: 99 mmol/L (ref 98–111)
Creatinine: 1.82 mg/dL — ABNORMAL HIGH (ref 0.44–1.00)
GFR, Estimated: 30 mL/min — ABNORMAL LOW (ref >60)
Glucose, Bld: 315 mg/dL — ABNORMAL HIGH (ref 70–99)
Potassium: 4.6 mmol/L (ref 3.5–5.1)
Sodium: 130 mmol/L — ABNORMAL LOW (ref 135–145)
Total Bilirubin: 0.4 mg/dL (ref 0.3–1.2)
Total Protein: 6 g/dL — ABNORMAL LOW (ref 6.5–8.1)

## 2021-03-18 LAB — RETICULOCYTES
Immature Retic Fract: 6 % (ref 2.3–15.9)
RBC.: 2.99 MIL/uL — ABNORMAL LOW (ref 3.87–5.11)
Retic Count, Absolute: 32.6 10*3/uL (ref 19.0–186.0)
Retic Ct Pct: 1.1 % (ref 0.4–3.1)

## 2021-03-18 MED ORDER — DARBEPOETIN ALFA 300 MCG/0.6ML IJ SOSY
300.0000 ug | PREFILLED_SYRINGE | Freq: Once | INTRAMUSCULAR | Status: AC
  Administered 2021-03-18: 300 ug via SUBCUTANEOUS
  Filled 2021-03-18: qty 0.6

## 2021-03-18 NOTE — Progress Notes (Signed)
Hematology and Oncology Follow Up Visit  Brittney Pace [id number] 1955/07/10 66 y.o. 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  Her problem continues to be the hyperglycemia.  She has a insulin pump.  I think this is still trying to be adjusted.  Today, her blood sugar was 315.Marland Kitchen  She does feel tired.  I am sure some of this has to do with her being anemic.  She has had her last Aranesp think back in December.  She has had no problems with bleeding.  She has had no problems with fever.  There is been no nausea or vomiting.  Her appetite seems to be doing decent..  She has had little bit of swelling in the legs.    When we last saw her, her ferritin was 122 with an iron saturation of 32%.  She has had no problems with rashes..  She is trying to stay as active as possible.  Her blood pressure is doing a whole lot better now that she is on antihypertensives.  Overall, I would say performance status is probably ECOG 2.      Medications:  Allergies as of 03/18/2021       Reactions   Morphine Anaphylaxis, Shortness Of Breath   Penicillins Hives   Tolerated ANCEF on 04/30/20 Has patient had a PCN reaction causing immediate rash, facial/tongue/throat swelling, SOB or lightheadedness with hypotension: Yes Has patient had a PCN reaction causing severe rash involving mucus membranes or skin necrosis: No Has patient had a PCN reaction that required hospitalization No Has patient had a PCN reaction occurring within the last 10 years: No If all of the above answers are "NO", then may proceed with Cephalosporin use.   Tramadol Anaphylaxis   Acetaminophen Other (See Comments)   Alters insulin pump readings   Amitriptyline Other (See Comments)   Severe headache/ out  of body feeling   Codeine Other (See Comments)   Severe headaches/ out of body feeling   Ibuprofen Other (See Comments)   Messes up CGM reading on glucose monitor   Krill Oil Diarrhea, Itching, Nausea And Vomiting   Losartan Cough   Propoxyphene Other (See Comments)   Severe headaches / out of body feeling   Shellfish Allergy Diarrhea, Nausea And Vomiting   Statins Other (See Comments)   Muscle pains Other reaction(s): Other   Sulfamethoxazole Other (See Comments)   Mouth ulcers   Fluconazole    Other reaction(s): Contact Dermatitis (intolerance)   Norvasc [amlodipine Besylate] Swelling        Medication List        Accurate as of March 18, 2021  3:17 PM. If you have any questions, ask your nurse or doctor.          AMBULATORY NON FORMULARY MEDICATION Incontinence pads per patient preference, #unlimited, refill x99 Fax to [phone]   amLODipine 5 MG tablet Commonly known as: NORVASC Take 1 tablet (5 mg total) by mouth daily.   Armodafinil 250 MG tablet 1 tab po q day   Armour Thyroid 60 MG tablet Generic drug: thyroid Take 1 tablet (60 mg total) by mouth daily before breakfast.   aspirin EC 325 MG tablet Take 1 tablet (325 mg total) by mouth daily. What  changed: when to take this   carvedilol 12.5 MG tablet Commonly known as: COREG Take 1 tablet (12.5 mg total) by mouth 2 (two) times daily with a meal.   Creon 3000-9500 units Cpep Generic drug: Pancrelipase (Lip-Prot-Amyl) Take 4 capsules by mouth 3 (three) times daily.   cyclobenzaprine 10 MG tablet Commonly known as: FLEXERIL Take 0.5-1 tablets (5-10 mg total) by mouth 3 (three) times daily as needed for muscle spasms. Caution: can cause drowsiness   Dexcom G6 Sensor Misc USE TO CONTINUOUSLY MONITOR BLOOD SUGARS. CHANGE EVERY 10 DAYS   diclofenac Sodium 1 % Gel Commonly known as: VOLTAREN Apply 2 g topically daily as needed (Pain).   diphenhydrAMINE 25 MG tablet Commonly known as:  BENADRYL Take 25 mg by mouth at bedtime.   diphenoxylate-atropine 2.5-0.025 MG tablet Commonly known as: LOMOTIL TAKE 1 TABLET BY MOUTH 4 TIMES DAILY AS NEEDED FOR DIARRHEA OR  LOOSE  STOOLS   Emgality 120 MG/ML Soaj Generic drug: Galcanezumab-gnlm Inject 120 mg into the skin every 30 (thirty) days.   esomeprazole 20 MG capsule Commonly known as: NEXIUM Take 20 mg by mouth every morning.   fluocinonide 0.05 % external solution Commonly known as: LIDEX Apply 1 application topically See admin instructions. Once every three days   gabapentin 100 MG capsule Commonly known as: NEURONTIN Take 1-2 capsules (100-200 mg total) by mouth 3 (three) times daily as needed.   insulin pump Soln Inject into the skin continuous. Novolog insulin   lidocaine-prilocaine cream Commonly known as: EMLA Apply 1 application topically every 2 (two) hours as needed. Please run with good Rx discount coupon   Melatonin 5 MG Chew Chew 5 mg by mouth daily as needed (Sleep).   nitroGLYCERIN 0.4 MG/SPRAY spray Commonly known as: NITROLINGUAL Place 1 spray under the tongue every 5 (five) minutes x 3 doses as needed for chest pain.   NovoLOG 100 UNIT/ML injection Generic drug: insulin aspart Inject 40 Units into the skin daily.   ondansetron 4 MG tablet Commonly known as: Zofran Take 1 tablet (4 mg total) by mouth every 8 (eight) hours as needed for nausea or vomiting.   OVER THE COUNTER MEDICATION as directed. Magnesium L-Treonate   promethazine 25 MG tablet Commonly known as: PHENERGAN Take 1 tablet (25 mg total) by mouth every 6 (six) hours as needed for nausea or vomiting.   spironolactone 25 MG tablet Commonly known as: ALDACTONE Take 50 mg by mouth daily.   torsemide 20 MG tablet Commonly known as: [name] TAKE 1 TABLET ONCE DAILY INTHE MORNING. MAY TAKE AN   EXTRA 1/2 TABLET AS        NEEDED What changed: See the new instructions.   valACYclovir 500 MG tablet Commonly known as:  VALTREX Take 500 mg by mouth daily as needed (Flair up).        Allergies:  Allergies  Allergen Reactions   Morphine Anaphylaxis and Shortness Of Breath   Penicillins Hives    Tolerated ANCEF on 04/30/20 Has patient had a PCN reaction causing immediate rash, facial/tongue/throat swelling, SOB or lightheadedness with hypotension: Yes Has patient had a PCN reaction causing severe rash involving mucus membranes or skin necrosis: No Has patient had a PCN reaction that required hospitalization No Has patient had a PCN reaction occurring within the last 10 years: No If all of the above answers are "NO", then may proceed with Cephalosporin use.   Tramadol Anaphylaxis   Acetaminophen Other (See Comments)    Alters insulin pump  readings    Amitriptyline Other (See Comments)    Severe headache/ out of body feeling   Codeine Other (See Comments)    Severe headaches/ out of body feeling    Ibuprofen Other (See Comments)    Messes up CGM reading on glucose monitor     Krill Oil Diarrhea, Itching and Nausea And Vomiting   Losartan Cough   Propoxyphene Other (See Comments)    Severe headaches / out of body feeling    Shellfish Allergy Diarrhea and Nausea And Vomiting   Statins Other (See Comments)    Muscle pains Other reaction(s): Other   Sulfamethoxazole Other (See Comments)    Mouth ulcers    Fluconazole     Other reaction(s): Contact Dermatitis (intolerance)   Norvasc [Amlodipine Besylate] Swelling    Past Medical History, Surgical history, Social history, and Family History were reviewed and updated.  Review of Systems: Review of Systems  Constitutional:  Positive for malaise/fatigue.  HENT: Negative.    Eyes: Negative.   Respiratory: Negative.    Cardiovascular:  Positive for leg swelling.  Gastrointestinal:  Positive for abdominal pain.  Genitourinary: Negative.   Musculoskeletal:  Positive for joint pain and myalgias.  Skin:  Positive for rash.  Neurological:  Negative.   Endo/Heme/Allergies: Negative.   Psychiatric/Behavioral: Negative.      Physical Exam:  vitals were not taken for this visit.   Wt Readings from Last 3 Encounters:  03/10/21 101 lb (45.8 kg)  02/08/21 102 lb 1.9 oz (46.3 kg)  12/24/20 95 lb 8 oz (43.3 kg)    Physical Exam Vitals reviewed.  HENT:     Head: Normocephalic and atraumatic.  Eyes:     Pupils: Pupils are equal, round, and reactive to light.  Cardiovascular:     Rate and Rhythm: Normal rate and regular rhythm.     Heart sounds: Normal heart sounds.  Pulmonary:     Effort: Pulmonary effort is normal.     Breath sounds: Normal breath sounds.  Abdominal:     General: Bowel sounds are normal.     Palpations: Abdomen is soft.  Musculoskeletal:        General: No tenderness or deformity. Normal range of motion.     Cervical back: Normal range of motion.     Comments: On her extremities, there is some edema in her legs.  She has about 2+ edema.  She has a macular type reddish rash in the lower extremities.  This is in the areas above her ankles.  There is a couple areas that might be a little bit open.  Lymphadenopathy:     Cervical: No cervical adenopathy.  Skin:    General: Skin is warm and dry.     Findings: No erythema or rash.  Neurological:     Mental Status: She is alert and oriented to person, place, and time.  Psychiatric:        Behavior: Behavior normal.        Thought Content: Thought content normal.        Judgment: Judgment normal.    Lab Results  Component Value Date   WBC 8.4 03/18/2021   HGB 9.0 (L) 03/18/2021   HCT 27.3 (L) 03/18/2021   MCV 90.7 03/18/2021   PLT 302 03/18/2021   Lab Results  Component Value Date   FERRITIN 122 02/08/2021   IRON 116 02/08/2021   TIBC 358 02/08/2021   UIBC 242 02/08/2021   IRONPCTSAT 32 (H) 02/08/2021  Lab Results  Component Value Date   RETICCTPCT 1.1 03/18/2021   RBC 2.99 (L) 03/18/2021   No results found for: KPAFRELGTCHN, LAMBDASER,  KAPLAMBRATIO No results found for: IGGSERUM, IGA, IGMSERUM No results found for: Ronnald Ramp, A1GS, A2GS, Violet Baldy, MSPIKE, SPEI   Chemistry      Component Value Date/Time   NA 132 (L) 02/08/2021 1322   NA 138 07/17/2017 1128   K 5.5 (H) 02/08/2021 1322   CL 101 02/08/2021 1322   CO2 23 02/08/2021 1322   BUN 42 (H) 02/08/2021 1322   BUN 23 07/17/2017 1128   CREATININE 1.64 (H) 02/08/2021 1322   CREATININE 0.99 07/20/2018 0821      Component Value Date/Time   CALCIUM 9.3 02/08/2021 1322   ALKPHOS 80 02/08/2021 1322   AST 25 02/08/2021 1322   ALT 14 02/08/2021 1322   BILITOT 0.3 02/08/2021 1322       Impression and Plan: Ms. Klomp is a very pleasant 66 yo female with multifactorial anemia.   We will go ahead and give her Aranesp today.  We did not give her Aranesp last time because her blood pressure was so high.  I think that if we can get her anemia better, she will feel better.  We will plan to get her back in 1 month.  We will see what her iron studies look like.  [name], MD 2/23/20233:17 PM

## 2021-03-18 NOTE — Patient Instructions (Signed)
Darbepoetin Alfa injection ?What is this medication? ?DARBEPOETIN ALFA (dar be POE e tin  AL fa) helps your body make more red blood cells. It is used to treat anemia caused by chronic kidney failure and chemotherapy. ?This medicine may be used for other purposes; ask your health care provider or pharmacist if you have questions. ?COMMON BRAND NAME(S): Aranesp ?What should I tell my care team before I take this medication? ?They need to know if you have any of these conditions: ?blood clotting disorders or history of blood clots ?cancer patient not on chemotherapy ?cystic fibrosis ?heart disease, such as angina, heart failure, or a history of a heart attack ?hemoglobin level of 12 g/dL or greater ?high blood pressure ?low levels of folate, iron, or vitamin B12 ?seizures ?an unusual or allergic reaction to darbepoetin, erythropoietin, albumin, hamster proteins, latex, other medicines, foods, dyes, or preservatives ?pregnant or trying to get pregnant ?breast-feeding ?How should I use this medication? ?This medicine is for injection into a vein or under the skin. It is usually given by a health care professional in a hospital or clinic setting. ?If you get this medicine at home, you will be taught how to prepare and give this medicine. Use exactly as directed. Take your medicine at regular intervals. Do not take your medicine more often than directed. ?It is important that you put your used needles and syringes in a special sharps container. Do not put them in a trash can. If you do not have a sharps container, call your pharmacist or healthcare provider to get one. ?A special MedGuide will be given to you by the pharmacist with each prescription and refill. Be sure to read this information carefully each time. ?Talk to your pediatrician regarding the use of this medicine in children. While this medicine may be used in children as young as 1 month of age for selected conditions, precautions do apply. ?Overdosage: If  you think you have taken too much of this medicine contact a poison control center or emergency room at once. ?NOTE: This medicine is only for you. Do not share this medicine with others. ?What if I miss a dose? ?If you miss a dose, take it as soon as you can. If it is almost time for your next dose, take only that dose. Do not take double or extra doses. ?What may interact with this medication? ?Do not take this medicine with any of the following medications: ?epoetin alfa ?This list may not describe all possible interactions. Give your health care provider a list of all the medicines, herbs, non-prescription drugs, or dietary supplements you use. Also tell them if you smoke, drink alcohol, or use illegal drugs. Some items may interact with your medicine. ?What should I watch for while using this medication? ?Your condition will be monitored carefully while you are receiving this medicine. ?You may need blood work done while you are taking this medicine. ?This medicine may cause a decrease in vitamin B6. You should make sure that you get enough vitamin B6 while you are taking this medicine. Discuss the foods you eat and the vitamins you take with your health care professional. ?What side effects may I notice from receiving this medication? ?Side effects that you should report to your doctor or health care professional as soon as possible: ?allergic reactions like skin rash, itching or hives, swelling of the face, lips, or tongue ?breathing problems ?changes in vision ?chest pain ?confusion, trouble speaking or understanding ?feeling faint or lightheaded, falls ?high blood   pressure ?muscle aches or pains ?pain, swelling, warmth in the leg ?rapid weight gain ?severe headaches ?sudden numbness or weakness of the face, arm or leg ?trouble walking, dizziness, loss of balance or coordination ?seizures (convulsions) ?swelling of the ankles, feet, hands ?unusually weak or tired ?Side effects that usually do not require  medical attention (report to your doctor or health care professional if they continue or are bothersome): ?diarrhea ?fever, chills (flu-like symptoms) ?headaches ?nausea, vomiting ?redness, stinging, or swelling at site where injected ?This list may not describe all possible side effects. Call your doctor for medical advice about side effects. You may report side effects to FDA at 1-800-FDA-1088. ?Where should I keep my medication? ?Keep out of the reach of children. ?Store in a refrigerator between 2 and 8 degrees C (36 and 46 degrees F). Do not freeze. Do not shake. Throw away any unused portion if using a single-dose vial. Throw away any unused medicine after the expiration date. ?NOTE: This sheet is a summary. It may not cover all possible information. If you have questions about this medicine, talk to your doctor, pharmacist, or health care provider. ?? 2022 [employer] Standard (2017-01-30 00:00:00) ? ?

## 2021-03-19 LAB — IRON AND IRON BINDING CAPACITY (CC-WL,HP ONLY)
Iron: 81 ug/dL (ref 28–170)
Saturation Ratios: 26 % (ref 10.4–31.8)
TIBC: 312 ug/dL (ref 250–450)
UIBC: 231 ug/dL (ref 148–442)

## 2021-03-19 LAB — FERRITIN: Ferritin: 199 ng/mL (ref 11–307)

## 2021-03-26 DIAGNOSIS — R5381 Other malaise: Secondary | ICD-10-CM | POA: Insufficient documentation

## 2021-03-26 DIAGNOSIS — L299 Pruritus, unspecified: Secondary | ICD-10-CM | POA: Insufficient documentation

## 2021-03-26 DIAGNOSIS — G479 Sleep disorder, unspecified: Secondary | ICD-10-CM | POA: Insufficient documentation

## 2021-04-16 ENCOUNTER — Encounter: Payer: Self-pay | Admitting: Hematology & Oncology

## 2021-04-16 ENCOUNTER — Other Ambulatory Visit: Payer: Self-pay

## 2021-04-16 ENCOUNTER — Inpatient Hospital Stay: Payer: Medicare Other | Attending: Hematology & Oncology

## 2021-04-16 ENCOUNTER — Inpatient Hospital Stay: Payer: Medicare Other

## 2021-04-16 ENCOUNTER — Inpatient Hospital Stay (HOSPITAL_BASED_OUTPATIENT_CLINIC_OR_DEPARTMENT_OTHER): Payer: Medicare Other | Admitting: Hematology & Oncology

## 2021-04-16 VITALS — BP 167/71 | HR 55 | Temp 97.7°F | Resp 18 | Wt 104.0 lb

## 2021-04-16 DIAGNOSIS — D500 Iron deficiency anemia secondary to blood loss (chronic): Secondary | ICD-10-CM

## 2021-04-16 DIAGNOSIS — Z8673 Personal history of transient ischemic attack (TIA), and cerebral infarction without residual deficits: Secondary | ICD-10-CM | POA: Diagnosis not present

## 2021-04-16 DIAGNOSIS — Z794 Long term (current) use of insulin: Secondary | ICD-10-CM | POA: Insufficient documentation

## 2021-04-16 DIAGNOSIS — E1165 Type 2 diabetes mellitus with hyperglycemia: Secondary | ICD-10-CM | POA: Diagnosis not present

## 2021-04-16 DIAGNOSIS — D460 Refractory anemia without ring sideroblasts, so stated: Secondary | ICD-10-CM | POA: Insufficient documentation

## 2021-04-16 DIAGNOSIS — D631 Anemia in chronic kidney disease: Secondary | ICD-10-CM

## 2021-04-16 DIAGNOSIS — Z79899 Other long term (current) drug therapy: Secondary | ICD-10-CM | POA: Insufficient documentation

## 2021-04-16 DIAGNOSIS — N183 Chronic kidney disease, stage 3 unspecified: Secondary | ICD-10-CM

## 2021-04-16 DIAGNOSIS — D638 Anemia in other chronic diseases classified elsewhere: Secondary | ICD-10-CM

## 2021-04-16 LAB — CMP (CANCER CENTER ONLY)
ALT: 9 U/L (ref 0–44)
AST: 19 U/L (ref 15–41)
Albumin: 3.8 g/dL (ref 3.5–5.0)
Alkaline Phosphatase: 76 U/L (ref 38–126)
Anion gap: 5 (ref 5–15)
BUN: 30 mg/dL — ABNORMAL HIGH (ref 8–23)
CO2: 22 mmol/L (ref 22–32)
Calcium: 9 mg/dL (ref 8.9–10.3)
Chloride: 103 mmol/L (ref 98–111)
Creatinine: 1.55 mg/dL — ABNORMAL HIGH (ref 0.44–1.00)
GFR, Estimated: 37 mL/min — ABNORMAL LOW (ref >60)
Glucose, Bld: 156 mg/dL — ABNORMAL HIGH (ref 70–99)
Potassium: 5.1 mmol/L (ref 3.5–5.1)
Sodium: 130 mmol/L — ABNORMAL LOW (ref 135–145)
Total Bilirubin: 0.5 mg/dL (ref 0.3–1.2)
Total Protein: 6.2 g/dL — ABNORMAL LOW (ref 6.5–8.1)

## 2021-04-16 LAB — CBC WITH DIFFERENTIAL (CANCER CENTER ONLY)
Abs Immature Granulocytes: 0.07 10*3/uL (ref 0.00–0.07)
Basophils Absolute: 0.1 10*3/uL (ref 0.0–0.1)
Basophils Relative: 1 %
Eosinophils Absolute: 0.3 10*3/uL (ref 0.0–0.5)
Eosinophils Relative: 5 %
HCT: 33.4 % — ABNORMAL LOW (ref 36.0–46.0)
Hemoglobin: 10.4 g/dL — ABNORMAL LOW (ref 12.0–15.0)
Immature Granulocytes: 1 %
Lymphocytes Relative: 21 %
Lymphs Abs: 1.3 10*3/uL (ref 0.7–4.0)
MCH: 28.7 pg (ref 26.0–34.0)
MCHC: 31.1 g/dL (ref 30.0–36.0)
MCV: 92.3 fL (ref 80.0–100.0)
Monocytes Absolute: 0.5 10*3/uL (ref 0.1–1.0)
Monocytes Relative: 8 %
Neutro Abs: 4.1 10*3/uL (ref 1.7–7.7)
Neutrophils Relative %: 64 %
Platelet Count: 317 10*3/uL (ref 150–400)
RBC: 3.62 MIL/uL — ABNORMAL LOW (ref 3.87–5.11)
RDW: 13.6 % (ref 11.5–15.5)
WBC Count: 6.4 10*3/uL (ref 4.0–10.5)
nRBC: 0 % (ref 0.0–0.2)

## 2021-04-16 LAB — RETICULOCYTES
Immature Retic Fract: 6.2 % (ref 2.3–15.9)
RBC.: 3.65 MIL/uL — ABNORMAL LOW (ref 3.87–5.11)
Retic Count, Absolute: 32.5 10*3/uL (ref 19.0–186.0)
Retic Ct Pct: 0.9 % (ref 0.4–3.1)

## 2021-04-16 MED ORDER — DARBEPOETIN ALFA 300 MCG/0.6ML IJ SOSY
300.0000 ug | PREFILLED_SYRINGE | Freq: Once | INTRAMUSCULAR | Status: AC
  Administered 2021-04-16: 300 ug via SUBCUTANEOUS
  Filled 2021-04-16: qty 0.6

## 2021-04-16 NOTE — Patient Instructions (Signed)
Darbepoetin Alfa injection ?What is this medication? ?DARBEPOETIN ALFA (dar be POE e tin  AL fa) helps your body make more red blood cells. It is used to treat anemia caused by chronic kidney failure and chemotherapy. ?This medicine may be used for other purposes; ask your health care provider or pharmacist if you have questions. ?COMMON BRAND NAME(S): Aranesp ?What should I tell my care team before I take this medication? ?They need to know if you have any of these conditions: ?blood clotting disorders or history of blood clots ?cancer patient not on chemotherapy ?cystic fibrosis ?heart disease, such as angina, heart failure, or a history of a heart attack ?hemoglobin level of 12 g/dL or greater ?high blood pressure ?low levels of folate, iron, or vitamin B12 ?seizures ?an unusual or allergic reaction to darbepoetin, erythropoietin, albumin, hamster proteins, latex, other medicines, foods, dyes, or preservatives ?pregnant or trying to get pregnant ?breast-feeding ?How should I use this medication? ?This medicine is for injection into a vein or under the skin. It is usually given by a health care professional in a hospital or clinic setting. ?If you get this medicine at home, you will be taught how to prepare and give this medicine. Use exactly as directed. Take your medicine at regular intervals. Do not take your medicine more often than directed. ?It is important that you put your used needles and syringes in a special sharps container. Do not put them in a trash can. If you do not have a sharps container, call your pharmacist or healthcare provider to get one. ?A special MedGuide will be given to you by the pharmacist with each prescription and refill. Be sure to read this information carefully each time. ?Talk to your pediatrician regarding the use of this medicine in children. While this medicine may be used in children as young as 1 month of age for selected conditions, precautions do apply. ?Overdosage: If  you think you have taken too much of this medicine contact a poison control center or emergency room at once. ?NOTE: This medicine is only for you. Do not share this medicine with others. ?What if I miss a dose? ?If you miss a dose, take it as soon as you can. If it is almost time for your next dose, take only that dose. Do not take double or extra doses. ?What may interact with this medication? ?Do not take this medicine with any of the following medications: ?epoetin alfa ?This list may not describe all possible interactions. Give your health care provider a list of all the medicines, herbs, non-prescription drugs, or dietary supplements you use. Also tell them if you smoke, drink alcohol, or use illegal drugs. Some items may interact with your medicine. ?What should I watch for while using this medication? ?Your condition will be monitored carefully while you are receiving this medicine. ?You may need blood work done while you are taking this medicine. ?This medicine may cause a decrease in vitamin B6. You should make sure that you get enough vitamin B6 while you are taking this medicine. Discuss the foods you eat and the vitamins you take with your health care professional. ?What side effects may I notice from receiving this medication? ?Side effects that you should report to your doctor or health care professional as soon as possible: ?allergic reactions like skin rash, itching or hives, swelling of the face, lips, or tongue ?breathing problems ?changes in vision ?chest pain ?confusion, trouble speaking or understanding ?feeling faint or lightheaded, falls ?high blood   pressure ?muscle aches or pains ?pain, swelling, warmth in the leg ?rapid weight gain ?severe headaches ?sudden numbness or weakness of the face, arm or leg ?trouble walking, dizziness, loss of balance or coordination ?seizures (convulsions) ?swelling of the ankles, feet, hands ?unusually weak or tired ?Side effects that usually do not require  medical attention (report to your doctor or health care professional if they continue or are bothersome): ?diarrhea ?fever, chills (flu-like symptoms) ?headaches ?nausea, vomiting ?redness, stinging, or swelling at site where injected ?This list may not describe all possible side effects. Call your doctor for medical advice about side effects. You may report side effects to FDA at 1-800-FDA-1088. ?Where should I keep my medication? ?Keep out of the reach of children. ?Store in a refrigerator between 2 and 8 degrees C (36 and 46 degrees F). Do not freeze. Do not shake. Throw away any unused portion if using a single-dose vial. Throw away any unused medicine after the expiration date. ?NOTE: This sheet is a summary. It may not cover all possible information. If you have questions about this medicine, talk to your doctor, pharmacist, or health care provider. ?? 2022 [employer] Standard (2017-01-30 00:00:00) ? ?

## 2021-04-16 NOTE — Progress Notes (Signed)
?Hematology and Oncology Follow Up Visit ? ?Nerida Boivin ?[id number] ?12/26/1955 66 y.o. ?04/16/2021 ? ? 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  ?Current Therapy:        ?Aranesp 300 mcg SQ to maintain Hgb > 11  ?  ?Interim History:  Ms. Penza is here today for follow-up.  Her blood sugar is much better.  I am so happy for her. ? ?She feels okay.  She has had no problems with nausea or vomiting.  She has had problems with her bowels.  She has diarrhea.  She is on Creon.  She will have to talk to her Gastroenterologist about this. ? ?She had iron studies done when we last saw her.  Her ferritin was 199 with an iron saturation of 26%. ? ?She has had no problems with fever.  She has had no rashes.  There is been no leg swelling. ? ?Overall, her performance status is ECOG 2.   ? ? ? ?Medications:  ?Allergies as of 04/16/2021   ? ?   Reactions  ? Morphine Anaphylaxis, Shortness Of Breath  ? Penicillins Hives  ? Tolerated ANCEF on 04/30/20 ?Has patient had a PCN reaction causing immediate rash, facial/tongue/throat swelling, SOB or lightheadedness with hypotension: Yes ?Has patient had a PCN reaction causing severe rash involving mucus membranes or skin necrosis: No ?Has patient had a PCN reaction that required hospitalization No ?Has patient had a PCN reaction occurring within the last 10 years: No ?If all of the above answers are "NO", then may proceed with Cephalosporin use.  ? Tramadol Anaphylaxis  ? Acetaminophen Other (See Comments)  ? Alters insulin pump readings  ? Amitriptyline Other (See Comments)  ? Severe headache/ out of body feeling  ? Codeine Other (See Comments)  ? Severe headaches/ out of body feeling  ? Ibuprofen Other (See Comments)  ? Messes up CGM reading on glucose monitor  ? Krill Oil Diarrhea, Itching, Nausea And Vomiting  ? Losartan Cough  ? Propoxyphene Other (See Comments)  ? Severe headaches / out of  body feeling  ? Shellfish Allergy Diarrhea, Nausea And Vomiting  ? Statins Other (See Comments)  ? Muscle pains ?Other reaction(s): Other  ? Sulfamethoxazole Other (See Comments)  ? Mouth ulcers  ? Fluconazole   ? Other reaction(s): Contact Dermatitis (intolerance)  ? Norvasc [amlodipine Besylate] Swelling  ? ?  ? ?  ?Medication List  ?  ? ?  ? Accurate as of April 16, 2021  3:38 PM. If you have any questions, ask your nurse or doctor.  ?  ?  ? ?  ? ?STOP taking these medications   ? ?amLODipine 5 MG tablet ?Commonly known as: NORVASC ?Stopped by: [name], MD ?  ?cyclobenzaprine 10 MG tablet ?Commonly known as: FLEXERIL ?Stopped by: [name], MD ?  ?diphenhydrAMINE 25 MG tablet ?Commonly known as: BENADRYL ?Stopped by: [name], MD ?  ?Emgality 120 MG/ML Soaj ?Generic drug: Galcanezumab-gnlm ?Stopped by: [name], MD ?  ?Melatonin 5 MG Chew ?Stopped by: [name], MD ?  ?ondansetron 4 MG tablet ?Commonly known as: Zofran ?Stopped by: [name], MD ?  ?promethazine 25 MG tablet ?Commonly known as: PHENERGAN ?Stopped by: [name], MD ?  ? ?  ? ?TAKE these medications   ? ?AMBULATORY NON FORMULARY MEDICATION ?Incontinence pads per patient preference, #unlimited, refill x99 ?Fax to [phone] ?  ?Armodafinil 250  MG tablet ?1 tab po q day ?  ?Armour Thyroid 60 MG tablet ?Generic drug: thyroid ?Take 1 tablet (60 mg total) by mouth daily before breakfast. ?  ?aspirin EC 325 MG tablet ?Take 1 tablet (325 mg total) by mouth daily. ?What changed: when to take this ?  ?carvedilol 12.5 MG tablet ?Commonly known as: COREG ?Take 1 tablet (12.5 mg total) by mouth 2 (two) times daily with a meal. ?  ?cetirizine 10 MG tablet ?Commonly known as: ZYRTEC ?Take 10 mg by mouth daily. ?  ?Creon 3000-9500 units Cpep ?Generic drug: Pancrelipase (Lip-Prot-Amyl) ?Take 12,000 Units by mouth in the morning, at noon, and at bedtime. ?What changed: Another medication with the same name was  removed. Continue taking this medication, and follow the directions you see here. ?Changed by: [name], MD ?  ?Dexcom G6 Sensor Misc ?USE TO CONTINUOUSLY MONITOR BLOOD SUGARS. CHANGE EVERY 10 DAYS ?  ?diclofenac Sodium 1 % Gel ?Commonly known as: VOLTAREN ?Apply 2 g topically daily as needed (Pain). ?  ?diphenoxylate-atropine 2.5-0.025 MG tablet ?Commonly known as: LOMOTIL ?TAKE 1 TABLET BY MOUTH 4 TIMES DAILY AS NEEDED FOR DIARRHEA OR  LOOSE  STOOLS ?  ?esomeprazole 20 MG capsule ?Commonly known as: Dwight ?Take 20 mg by mouth every morning. ?  ?fluocinonide 0.05 % external solution ?Commonly known as: LIDEX ?Apply 1 application topically See admin instructions. Once every three days ?  ?gabapentin 100 MG capsule ?Commonly known as: NEURONTIN ?Take 1-2 capsules (100-200 mg total) by mouth 3 (three) times daily as needed. ?  ?insulin pump Soln ?Inject into the skin continuous. Novolog insulin ?  ?lidocaine-prilocaine cream ?Commonly known as: EMLA ?Apply 1 application topically every 2 (two) hours as needed. Please run with good Rx discount coupon ?  ?lisinopril 10 MG tablet ?Commonly known as: ZESTRIL ?Take 10 mg by mouth daily. ?  ?nitroGLYCERIN 0.4 MG/SPRAY spray ?Commonly known as: NITROLINGUAL ?Place 1 spray under the tongue every 5 (five) minutes x 3 doses as needed for chest pain. ?  ?NovoLOG 100 UNIT/ML injection ?Generic drug: insulin aspart ?Inject 40 Units into the skin daily. ?  ?OVER THE COUNTER MEDICATION ?as directed. Magnesium L-Treonate ?  ?Premarin vaginal cream ?Generic drug: conjugated estrogens ?Place vaginally. ?  ?spironolactone 25 MG tablet ?Commonly known as: ALDACTONE ?Take 50 mg by mouth daily. ?  ?torsemide 20 MG tablet ?Commonly known as: [name] ?TAKE 1 TABLET ONCE DAILY INTHE MORNING. MAY TAKE AN   EXTRA 1/2 TABLET AS        NEEDED ?What changed: See the new instructions. ?  ?valACYclovir 500 MG tablet ?Commonly known as: VALTREX ?Take 500 mg by mouth daily as needed (Flair  up). ?  ?Vitamin D (Ergocalciferol) 1.25 MG (50000 UNIT) Caps capsule ?Commonly known as: DRISDOL ?Take 50,000 Units by mouth once a week. ?  ? ?  ? ? ?Allergies:  ?Allergies  ?Allergen Reactions  ? Morphine Anaphylaxis and Shortness Of Breath  ? Penicillins Hives  ?  Tolerated ANCEF on 04/30/20 ?Has patient had a PCN reaction causing immediate rash, facial/tongue/throat swelling, SOB or lightheadedness with hypotension: Yes ?Has patient had a PCN reaction causing severe rash involving mucus membranes or skin necrosis: No ?Has patient had a PCN reaction that required hospitalization No ?Has patient had a PCN reaction occurring within the last 10 years: No ?If all of the above answers are "NO", then may proceed with Cephalosporin use.  ? Tramadol Anaphylaxis  ? Acetaminophen Other (See Comments)  ?  Alters insulin pump readings ?  ? Amitriptyline Other (See Comments)  ?  Severe headache/ out of body feeling  ? Codeine Other (See Comments)  ?  Severe headaches/ out of body feeling ?  ? Ibuprofen Other (See Comments)  ?  Messes up CGM reading on glucose monitor ? ?  ? Krill Oil Diarrhea, Itching and Nausea And Vomiting  ? Losartan Cough  ? Propoxyphene Other (See Comments)  ?  Severe headaches / out of body feeling ?  ? Shellfish Allergy Diarrhea and Nausea And Vomiting  ? Statins Other (See Comments)  ?  Muscle pains ?Other reaction(s): Other  ? Sulfamethoxazole Other (See Comments)  ?  Mouth ulcers ?  ? Fluconazole   ?  Other reaction(s): Contact Dermatitis (intolerance)  ? Norvasc [Amlodipine Besylate] Swelling  ? ? ?Past Medical History, Surgical history, Social history, and Family History were reviewed and updated. ? ?Review of Systems: ?Review of Systems  ?Constitutional:  Positive for malaise/fatigue.  ?HENT: Negative.    ?Eyes: Negative.   ?Respiratory: Negative.    ?Cardiovascular:  Positive for leg swelling.  ?Gastrointestinal:  Positive for abdominal pain.  ?Genitourinary: Negative.   ?Musculoskeletal:   Positive for joint pain and myalgias.  ?Skin:  Positive for rash.  ?Neurological: Negative.   ?Endo/Heme/Allergies: Negative.   ?Psychiatric/Behavioral: Negative.    ? ? ?Physical Exam: ? weight is 104 lb (47.2 kg).

## 2021-04-19 ENCOUNTER — Telehealth: Payer: Self-pay

## 2021-04-19 LAB — IRON AND IRON BINDING CAPACITY (CC-WL,HP ONLY)
Iron: 149 ug/dL (ref 28–170)
Saturation Ratios: 42 % — ABNORMAL HIGH (ref 10.4–31.8)
TIBC: 354 ug/dL (ref 250–450)
UIBC: 205 ug/dL (ref 148–442)

## 2021-04-19 LAB — FERRITIN: Ferritin: 52 ng/mL (ref 11–307)

## 2021-04-19 NOTE — Telephone Encounter (Signed)
-----   Message from [name], MD sent at 04/19/2021 10:48 AM EDT ----- ?Please call and tell her that the iron level is actually okay.  Pete ?

## 2021-04-19 NOTE — Telephone Encounter (Signed)
Advised via MyChart.

## 2021-05-07 ENCOUNTER — Other Ambulatory Visit: Payer: Self-pay | Admitting: Cardiology

## 2021-05-07 ENCOUNTER — Other Ambulatory Visit: Payer: Self-pay

## 2021-05-07 DIAGNOSIS — I2510 Atherosclerotic heart disease of native coronary artery without angina pectoris: Secondary | ICD-10-CM

## 2021-05-07 MED ORDER — CARVEDILOL 12.5 MG PO TABS: 12.5000 mg | ORAL_TABLET | Freq: Two times a day (BID) | ORAL | 3 refills | Status: DC

## 2021-05-14 ENCOUNTER — Inpatient Hospital Stay (HOSPITAL_BASED_OUTPATIENT_CLINIC_OR_DEPARTMENT_OTHER): Payer: Medicare Other | Admitting: Hematology & Oncology

## 2021-05-14 ENCOUNTER — Other Ambulatory Visit: Payer: Self-pay

## 2021-05-14 ENCOUNTER — Inpatient Hospital Stay: Payer: Medicare Other | Attending: Hematology & Oncology

## 2021-05-14 ENCOUNTER — Encounter: Payer: Self-pay | Admitting: Hematology & Oncology

## 2021-05-14 ENCOUNTER — Inpatient Hospital Stay: Payer: Medicare Other

## 2021-05-14 VITALS — BP 153/71 | HR 65 | Temp 98.0°F | Resp 17 | Wt 102.0 lb

## 2021-05-14 DIAGNOSIS — D460 Refractory anemia without ring sideroblasts, so stated: Secondary | ICD-10-CM | POA: Diagnosis present

## 2021-05-14 DIAGNOSIS — D631 Anemia in chronic kidney disease: Secondary | ICD-10-CM

## 2021-05-14 DIAGNOSIS — E1122 Type 2 diabetes mellitus with diabetic chronic kidney disease: Secondary | ICD-10-CM | POA: Diagnosis not present

## 2021-05-14 DIAGNOSIS — Z794 Long term (current) use of insulin: Secondary | ICD-10-CM | POA: Insufficient documentation

## 2021-05-14 DIAGNOSIS — D500 Iron deficiency anemia secondary to blood loss (chronic): Secondary | ICD-10-CM

## 2021-05-14 DIAGNOSIS — R197 Diarrhea, unspecified: Secondary | ICD-10-CM | POA: Diagnosis not present

## 2021-05-14 DIAGNOSIS — Z79899 Other long term (current) drug therapy: Secondary | ICD-10-CM | POA: Diagnosis not present

## 2021-05-14 LAB — CBC WITH DIFFERENTIAL (CANCER CENTER ONLY)
Abs Immature Granulocytes: 0.02 10*3/uL (ref 0.00–0.07)
Basophils Absolute: 0.1 10*3/uL (ref 0.0–0.1)
Basophils Relative: 1 %
Eosinophils Absolute: 0.3 10*3/uL (ref 0.0–0.5)
Eosinophils Relative: 5 %
HCT: 37 % (ref 36.0–46.0)
Hemoglobin: 11.5 g/dL — ABNORMAL LOW (ref 12.0–15.0)
Immature Granulocytes: 0 %
Lymphocytes Relative: 25 %
Lymphs Abs: 1.6 10*3/uL (ref 0.7–4.0)
MCH: 28 pg (ref 26.0–34.0)
MCHC: 31.1 g/dL (ref 30.0–36.0)
MCV: 90.2 fL (ref 80.0–100.0)
Monocytes Absolute: 0.6 10*3/uL (ref 0.1–1.0)
Monocytes Relative: 9 %
Neutro Abs: 4 10*3/uL (ref 1.7–7.7)
Neutrophils Relative %: 60 %
Platelet Count: 312 10*3/uL (ref 150–400)
RBC: 4.1 MIL/uL (ref 3.87–5.11)
RDW: 13.5 % (ref 11.5–15.5)
WBC Count: 6.6 10*3/uL (ref 4.0–10.5)
nRBC: 0 % (ref 0.0–0.2)

## 2021-05-14 LAB — CMP (CANCER CENTER ONLY)
ALT: 8 U/L (ref 0–44)
AST: 14 U/L — ABNORMAL LOW (ref 15–41)
Albumin: 3.6 g/dL (ref 3.5–5.0)
Alkaline Phosphatase: 74 U/L (ref 38–126)
Anion gap: 5 (ref 5–15)
BUN: 39 mg/dL — ABNORMAL HIGH (ref 8–23)
CO2: 24 mmol/L (ref 22–32)
Calcium: 9.1 mg/dL (ref 8.9–10.3)
Chloride: 104 mmol/L (ref 98–111)
Creatinine: 1.85 mg/dL — ABNORMAL HIGH (ref 0.44–1.00)
GFR, Estimated: 30 mL/min — ABNORMAL LOW (ref >60)
Glucose, Bld: 97 mg/dL (ref 70–99)
Potassium: 5.7 mmol/L — ABNORMAL HIGH (ref 3.5–5.1)
Sodium: 133 mmol/L — ABNORMAL LOW (ref 135–145)
Total Bilirubin: 0.5 mg/dL (ref 0.3–1.2)
Total Protein: 6 g/dL — ABNORMAL LOW (ref 6.5–8.1)

## 2021-05-14 LAB — RETICULOCYTES
Immature Retic Fract: 7.1 % (ref 2.3–15.9)
RBC.: 4.11 MIL/uL (ref 3.87–5.11)
Retic Count, Absolute: 38.2 10*3/uL (ref 19.0–186.0)
Retic Ct Pct: 0.9 % (ref 0.4–3.1)

## 2021-05-14 LAB — FERRITIN: Ferritin: 28 ng/mL (ref 11–307)

## 2021-05-14 MED ORDER — ARMODAFINIL 250 MG PO TABS: ORAL_TABLET | ORAL | 1 refills | Status: DC

## 2021-05-14 NOTE — Progress Notes (Signed)
?Hematology and Oncology Follow Up Visit ? ?Brittney Pace ?[id number] ?09-06-1955 66 y.o. ?05/14/2021 ? ? 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  ?Current Therapy:        ?Aranesp 300 mcg SQ to maintain Hgb > 11  ?  ?Interim History:  Brittney Pace is here today for follow-up.  Unfortunately, her problem has been this diarrhea.  This has been chronic.  It often wakes her up at nighttime she has had diarrhea. ? ?I thought maybe Creon would help her out.  She says she is taking Creon but this really has not been all that helpful. ? ?We will have to see about getting her to Gastroenterology and see if they have a able to help her out. ? ?Her blood sugars are doing much better.  I am glad to see that this is under control.  I just wonder if the diarrhea might be a "dumping" issue that could be seen with diabetes. ? ?She is doing well with respect to her blood.  Her hemoglobin is the best has been a long time.  She does not need any Aranesp today. ? ?Her ferritin was 52 with an iron saturation of 42%.  This was back in March. ? ?She is tired all the time.  She thinks that she needs the Nuvigil.  Hopefully, this will help with her stamina. ? ?Overall, I would have said that her performance status is probably ECOG 1.   ? ?Medications:  ?Allergies as of 05/14/2021   ? ?   Reactions  ? Morphine Anaphylaxis, Shortness Of Breath  ? Penicillins Hives  ? Tolerated ANCEF on 04/30/20 ?Has patient had a PCN reaction causing immediate rash, facial/tongue/throat swelling, SOB or lightheadedness with hypotension: Yes ?Has patient had a PCN reaction causing severe rash involving mucus membranes or skin necrosis: No ?Has patient had a PCN reaction that required hospitalization No ?Has patient had a PCN reaction occurring within the last 10 years: No ?If all of the above answers are "NO", then may proceed with Cephalosporin use.  ? Tramadol  Anaphylaxis  ? Acetaminophen Other (See Comments)  ? Alters insulin pump readings  ? Amitriptyline Other (See Comments)  ? Severe headache/ out of body feeling  ? Codeine Other (See Comments)  ? Severe headaches/ out of body feeling  ? Ibuprofen Other (See Comments)  ? Messes up CGM reading on glucose monitor  ? Krill Oil Diarrhea, Itching, Nausea And Vomiting  ? Losartan Cough  ? Propoxyphene Other (See Comments)  ? Severe headaches / out of body feeling  ? Shellfish Allergy Diarrhea, Nausea And Vomiting  ? Statins Other (See Comments)  ? Muscle pains ?Other reaction(s): Other  ? Sulfamethoxazole Other (See Comments)  ? Mouth ulcers  ? Fluconazole   ? Other reaction(s): Contact Dermatitis (intolerance)  ? Norvasc [amlodipine Besylate] Swelling  ? ?  ? ?  ?Medication List  ?  ? ?  ? Accurate as of May 14, 2021  4:26 PM. If you have any questions, ask your nurse or doctor.  ?  ?  ? ?  ? ?AMBULATORY NON FORMULARY MEDICATION ?Incontinence pads per patient preference, #unlimited, refill x99 ?Fax to [phone] ?  ?Armodafinil 250 MG tablet ?1 tab po q day ?  ?Armour Thyroid 60 MG tablet ?Generic drug: thyroid ?Take 1 tablet (60 mg total) by mouth daily before breakfast. ?  ?aspirin EC 325 MG tablet ?Take 1 tablet (325 mg total)  by mouth daily. ?What changed: when to take this ?  ?carvedilol 12.5 MG tablet ?Commonly known as: COREG ?Take 1 tablet (12.5 mg total) by mouth 2 (two) times daily with a meal. ?  ?cetirizine 10 MG tablet ?Commonly known as: ZYRTEC ?Take 10 mg by mouth daily. ?  ?Creon 3000-9500 units Cpep ?Generic drug: Pancrelipase (Lip-Prot-Amyl) ?Take 12,000 Units by mouth in the morning, at noon, and at bedtime. ?  ?Dexcom G6 Sensor Misc ?USE TO CONTINUOUSLY MONITOR BLOOD SUGARS. CHANGE EVERY 10 DAYS ?  ?diclofenac Sodium 1 % Gel ?Commonly known as: VOLTAREN ?Apply 2 g topically daily as needed (Pain). ?  ?diphenoxylate-atropine 2.5-0.025 MG tablet ?Commonly known as: LOMOTIL ?TAKE 1 TABLET BY MOUTH 4  TIMES DAILY AS NEEDED FOR DIARRHEA OR  LOOSE  STOOLS ?  ?esomeprazole 20 MG capsule ?Commonly known as: [place] ?Take 20 mg by mouth every morning. ?  ?fluocinonide 0.05 % external solution ?Commonly known as: LIDEX ?Apply 1 application topically See admin instructions. Once every three days ?  ?gabapentin 100 MG capsule ?Commonly known as: NEURONTIN ?Take 1-2 capsules (100-200 mg total) by mouth 3 (three) times daily as needed. ?  ?insulin pump Soln ?Inject into the skin continuous. Novolog insulin ?  ?lidocaine-prilocaine cream ?Commonly known as: EMLA ?Apply 1 application topically every 2 (two) hours as needed. Please run with good Rx discount coupon ?  ?lisinopril 10 MG tablet ?Commonly known as: ZESTRIL ?Take 10 mg by mouth daily. ?  ?nitroGLYCERIN 0.4 MG/SPRAY spray ?Commonly known as: NITROLINGUAL ?Place 1 spray under the tongue every 5 (five) minutes x 3 doses as needed for chest pain. ?  ?NovoLOG 100 UNIT/ML injection ?Generic drug: insulin aspart ?Inject 40 Units into the skin daily. ?  ?OVER THE COUNTER MEDICATION ?as directed. Magnesium L-Treonate ?  ?Premarin vaginal cream ?Generic drug: conjugated estrogens ?Place vaginally. ?  ?spironolactone 25 MG tablet ?Commonly known as: ALDACTONE ?Take 50 mg by mouth daily. ?  ?torsemide 20 MG tablet ?Commonly known as: [name] ?TAKE 1 TABLET ONCE DAILY INTHE MORNING. MAY TAKE AN   EXTRA 1/2 TABLET AS        NEEDED ?What changed: See the new instructions. ?  ?valACYclovir 500 MG tablet ?Commonly known as: VALTREX ?Take 500 mg by mouth daily as needed (Flair up). ?  ?Vitamin D (Ergocalciferol) 1.25 MG (50000 UNIT) Caps capsule ?Commonly known as: DRISDOL ?Take 50,000 Units by mouth once a week. ?  ? ?  ? ? ?Allergies:  ?Allergies  ?Allergen Reactions  ? Morphine Anaphylaxis and Shortness Of Breath  ? Penicillins Hives  ?  Tolerated ANCEF on 04/30/20 ?Has patient had a PCN reaction causing immediate rash, facial/tongue/throat swelling, SOB or lightheadedness with  hypotension: Yes ?Has patient had a PCN reaction causing severe rash involving mucus membranes or skin necrosis: No ?Has patient had a PCN reaction that required hospitalization No ?Has patient had a PCN reaction occurring within the last 10 years: No ?If all of the above answers are "NO", then may proceed with Cephalosporin use.  ? Tramadol Anaphylaxis  ? Acetaminophen Other (See Comments)  ?  Alters insulin pump readings ?  ? Amitriptyline Other (See Comments)  ?  Severe headache/ out of body feeling  ? Codeine Other (See Comments)  ?  Severe headaches/ out of body feeling ?  ? Ibuprofen Other (See Comments)  ?  Messes up CGM reading on glucose monitor ? ?  ? Krill Oil Diarrhea, Itching and Nausea And Vomiting  ? Losartan  Cough  ? Propoxyphene Other (See Comments)  ?  Severe headaches / out of body feeling ?  ? Shellfish Allergy Diarrhea and Nausea And Vomiting  ? Statins Other (See Comments)  ?  Muscle pains ?Other reaction(s): Other  ? Sulfamethoxazole Other (See Comments)  ?  Mouth ulcers ?  ? Fluconazole   ?  Other reaction(s): Contact Dermatitis (intolerance)  ? Norvasc [Amlodipine Besylate] Swelling  ? ? ?Past Medical History, Surgical history, Social history, and Family History were reviewed and updated. ? ?Review of Systems: ?Review of Systems  ?Constitutional:  Positive for malaise/fatigue.  ?HENT: Negative.    ?Eyes: Negative.   ?Respiratory: Negative.    ?Cardiovascular:  Positive for leg swelling.  ?Gastrointestinal:  Positive for abdominal pain.  ?Genitourinary: Negative.   ?Musculoskeletal:  Positive for joint pain and myalgias.  ?Skin:  Positive for rash.  ?Neurological: Negative.   ?Endo/Heme/Allergies: Negative.   ?Psychiatric/Behavioral: Negative.    ? ? ?Physical Exam: ? weight is 102 lb (46.3 kg). Her oral temperature is 98 ?F (36.7 ?C). Her blood pressure is 153/71 (abnormal) and her pulse is 65. Her respiration is 17 and oxygen saturation is 100%.  ? ?Wt Readings from Last 3 Encounters:   ?05/14/21 102 lb (46.3 kg)  ?04/16/21 104 lb (47.2 kg)  ?03/18/21 101 lb 12.8 oz (46.2 kg)  ? ? ?Physical Exam ?Vitals reviewed.  ?HENT:  ?   Head: Normocephalic and atraumatic.  ?Eyes:  ?   Pupils: Pupils are equal,

## 2021-05-17 LAB — IRON AND IRON BINDING CAPACITY (CC-WL,HP ONLY)
Iron: 181 ug/dL — ABNORMAL HIGH (ref 28–170)
Saturation Ratios: 51 % — ABNORMAL HIGH (ref 10.4–31.8)
TIBC: 356 ug/dL (ref 250–450)
UIBC: 175 ug/dL (ref 148–442)

## 2021-05-24 ENCOUNTER — Telehealth: Payer: Self-pay | Admitting: *Deleted

## 2021-05-24 NOTE — Telephone Encounter (Signed)
Call received from patient stating that she has not heard from GI referral as of today that was sent in by [name]. Pt informed that GI has tried to contact her twice per referral note and given [facility] GI's phone number to contact for appt.  Pt is appreciative of assistance and has no further questions at this time.  ?

## 2021-05-26 ENCOUNTER — Other Ambulatory Visit: Payer: Self-pay | Admitting: *Deleted

## 2021-05-26 MED ORDER — MODAFINIL 100 MG PO TABS: 100.0000 mg | ORAL_TABLET | Freq: Every day | ORAL | 0 refills | Status: DC

## 2021-05-26 NOTE — Telephone Encounter (Signed)
Message received from on-call provider stating that pt called and said that she spoke with her insurance company and said that Provigil will be covered and Nuvigil will not be covered.  [name] notified.  Nuvigil d/c'd and Provigil sent to pt.'s pharmacy per order of [name].  ?

## 2021-05-27 ENCOUNTER — Encounter (HOSPITAL_COMMUNITY): Payer: Self-pay | Admitting: Surgery

## 2021-05-27 NOTE — Progress Notes (Signed)
PA for Modafinil 100 mg tablets was denied by [facility] on 05/27/21.  [name] was notified and does not want to appeal the decision. ?

## 2021-06-04 ENCOUNTER — Encounter: Payer: Self-pay | Admitting: Gastroenterology

## 2021-06-04 ENCOUNTER — Ambulatory Visit (INDEPENDENT_AMBULATORY_CARE_PROVIDER_SITE_OTHER): Payer: Medicare Other | Admitting: Gastroenterology

## 2021-06-04 VITALS — BP 160/68 | HR 59 | Ht 59.0 in | Wt 105.0 lb

## 2021-06-04 DIAGNOSIS — K580 Irritable bowel syndrome with diarrhea: Secondary | ICD-10-CM

## 2021-06-04 DIAGNOSIS — K6389 Other specified diseases of intestine: Secondary | ICD-10-CM | POA: Diagnosis not present

## 2021-06-04 NOTE — Patient Instructions (Addendum)
If you are age 67 or older, your body mass index should be between 23-30. Your Body mass index is 21.21 kg/m?Marland Kitchen If this is out of the aforementioned range listed, please consider follow up with your Primary Care Provider. ? ?If you are age 80 or younger, your body mass index should be between 19-25. Your Body mass index is 21.21 kg/m?Marland Kitchen If this is out of the aformentioned range listed, please consider follow up with your Primary Care Provider.  ? ?________________________________________________________ ? ?The [facility] providers would like to encourage you to use [facility] to communicate with providers for non-urgent requests or questions.  Due to long hold times on the telephone, sending your provider a message by [facility] may be a faster and more efficient way to get a response.  Please allow 48 business hours for a response.  Please remember that this is for non-urgent requests.  ?_______________________________________________________ ? ?Please go to the lab on the 2nd floor [address] before you leave the office today. Return kit back to same location and hemoccult cards once done ? ?Continue lomotil ? ?Please call with any questions ? ?Thank you, ? ?Dr. Jackquline Denmark ? ? ? ? ? ? ? ?We want to thank you for trusting [facility] with your care. All of our staff and providers value the relationships we have built with our patients, and it is an honor to care for you.  ? ?We are writing to let you know that [facility] [place] will close on Jun 07, 2021, and we invite you to continue to see Dr. Carmell Austria and Gerrit Heck at the [facility] [place] office location. We are consolidating our serices at these [facility] practices to better provide care. Our office staff will work with you to ensure a seamless transition.  ? ?Gerrit Heck, DO -Dr. Bryan Lemma will be movig to [facility] at [address] [place], [place] [address], effective Jun 07, 2021.   Contact [phone] to schedule an appointment with him.  ? ?Carmell Austria, MD- Dr. Lyndel Safe will be movig to [facility] at [address], [place], [place] [address], effective Jun 07, 2021.  Contact [phone] to schedule an appointment with him.  ? ?Requesting Medical Records ?If you need to request your medical records, please follow the instructions below. Your medical records are confidential, and a copy can be transferred to another provider or released to you or another person you designate only with your permission. ? ?There are several ways to request your medical records: ?Requests for medical records can be submitted through our practice.   ?You can also request your records electronically, in your MyChart account by selecting the ?Request Health Records? tab.  ?If you need additional information on how to request records, please go to [email or web address] choose Patient Information, then select Request Medical Records. ?To make an appointment or if you have any questions about your health care needs, please contact our office at [phone] and one of our staff members will be glad to assist you. ?[facility] is committed to providing exceptional care for you and our community. Thank you for allowing [place] to serve your health care needs. ?Sincerely, ? ?Windy Canny, Director [facility] ?[facility] also offers convenient virtual care options. Sore throat? Sinus problems? Cold or flu symptoms? Get care from the comfort of home with [facility] Video Visits and e-Visits. Learn more about the non-emergency conditions treated and start your virtual visit at [email or web address] ? ?

## 2021-06-04 NOTE — Progress Notes (Signed)
? ? ?Chief Complaint:  ? ?Referring Provider:  No ref. provider found    ? ? ?ASSESSMENT AND PLAN;  ? ?#1. IBS-D  ? ?#2. H/O SIBO 2017, treated with rifaximin. ? ?#3. H/O gastroparesis. Neg GES. (Had tardive dyskinesia with reglan).  Diet controlled. ? ?Plan: ?-Stool studies for GI Pathogen (includes C. Diff), fecal elastase, fat and Calprotectin ?-Hemoccult stools x 3 ?-Continue Lomotil 1 tab BID as needed (being given by [name]) ?-Continue creon for now.  Note that she is on very low-dose. ?-Refuses Colon.  Understands risks and benefits. ?-Reassured patient. ? ? ? ? ?HPI:   ? ?Brittney Pace is a 66 y.o. female  ?With CKD3, CAD, type 1 diabetes with diabetic retinopathy, H/O Lyme disease and babesiasis, hypothyroidism, chronic migraines, IBS, chronic anemia (Hb 10, 03/2021) d/t chronic kidney disease, history of stroke, fibromyalgia. ? ?Has seen multiple gastroenterologists ([place], [place], [place], [place]) ? ?Dx with IBS-D ? ?Dx with diabetic gastroparesis, eval by Dr Roney Mans at [facility], neg GES. Pt was on cisapride until taken off market.  Had tardive dyskinesia with Reglan.  Currently diet controlled. ? ?Dx SIBO 2017 on hydrogen/methane breath test, treated with rifaximin ? ?Diarrhea -episodic every 3-5 days, then constipation (No Bms) x several years. ?Apparently had normal fecal elastase, given empiric Creon by endocrine with some relief. ?Psyllium husk helps ?Lomotil helps during those episodes ?No nocturnal symptoms.  No melena or hematochezia.  Has gained weight from 102pounds to 105 pounds over the last 2 months. ? ?Heartburn under good control with Nexium. ? ?No nausea, vomiting, regurgitation, odynophagia or dysphagia.  No significant diarrhea or constipation. No unintentional weight loss. No abdominal pain currently. ? ?She was scheduled for colonoscopy but canceled.  She will also schedule for colonoscopy in 2015 which she canceled as well.  She does not want to get colonoscopy  done. ? ?Last EGD/colonoscopy was in 2017 at [facility].  No random colon biopsies.  Also had EGD/colon in [place] 2012.  Got "violently sick" using Gatorade/MiraLAX.  Refuses any further colonoscopies. ? ? ?Past Medical History:  ?Diagnosis Date  ? Anemia   ? Arthritis   ? 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 CHF (congestive heart failure) ([place])   ? Chronic kidney disease   ? stage 3  ? Coronary artery disease   ? Depression   ? Diabetes mellitus without complication ([place])   ? Type 1  ? Diabetic retinopathy ([place])   ? 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  ? Family history of adverse reaction to anesthesia   ? mother: " while she was under she stopped breathing."  ? Fibromyalgia   ? Gastroparesis   ? GERD (gastroesophageal reflux disease)   ? Headache   ? migraines  ? Hypothyroidism   ? IBS (irritable bowel syndrome)   ? Idiopathic edema   ? Iron deficiency anemia 09/04/2019  ? Lyme disease   ? Mitral valve prolapse   ? Myocardial infarction [facility])   ? 1 major in 1999 and 2 minor " small vessel disease."  ? Osteoporosis   ? Peripheral neuropathy   ? Peripheral vascular disease ([place])   ? Sinus disorder   ? 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 Stroke [facility])   ? 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 ? ?Past Surgical History:  ?Procedure Laterality Date  ? ABDOMINAL HYSTERECTOMY    ? APPENDECTOMY    ?  BREAST SURGERY    ? B/L biopsy and lumpectomy   ? CARDIAC CATHETERIZATION    ? CARPAL TUNNEL RELEASE    ? CATARACT EXTRACTION W/ INTRAOCULAR LENS IMPLANT    ? right eye  ? COLONOSCOPY W/ BIOPSIES AND POLYPECTOMY    ? CORONARY ARTERY BYPASS GRAFT    ? coronary artery stents    ? at [facility]  ? ECTOPIC PREGNANCY SURGERY    ? NASAL SEPTUM SURGERY    ? OPEN REDUCTION INTERNAL FIXATION (ORIF) DISTAL RADIAL FRACTURE Right 05/06/2015  ? Procedure: OPEN REDUCTION INTERNAL FIXATION (ORIF) RIGHT DISTAL RADIAL FRACTURE AND REPAIRS AS NEEDED;  Surgeon: Iran Planas, MD;   Location: [facility];  Service: Orthopedics;  Laterality: Right;  ? ORIF HUMERUS FRACTURE Right 04/30/2020  ? Procedure: OPEN REDUCTION INTERNAL FIXATION (ORIF) PROXIMAL HUMERUS FRACTURE;  Surgeon: Justice Britain, MD;  Location: [facility];  Service: Orthopedics;  Laterality: Right;  180mn  ? ORIF WRIST FRACTURE Left 11/05/2014  ? ORIF WRIST FRACTURE Left 11/05/2014  ? Procedure: OPEN REDUCTION INTERNAL FIXATION (ORIF) LEFT WRIST FRACTURE AND REPAIR AS INDICATED;  Surgeon: FIran Planas MD;  [place]  Service: Orthopedics;  Laterality: Left;  ? TRIGGER FINGER RELEASE    ? ? ?Family History  ?Problem Relation Age of Onset  ? Breast cancer Mother   ? Migraines Mother   ? Heart disease Father   ? Hypertension Father   ? Breast cancer Sister   ? ? ?Social History  ? ?Tobacco Use  ? Smoking status: Former  ?  Types: Cigarettes  ? Smokeless tobacco: Never  ? Tobacco comments:  ?   " Quit smoking cigarettes in 20's "  ?Vaping Use  ? Vaping Use: Never used  ?Substance Use Topics  ? Alcohol use: Yes  ?  Comment: occasional beer or wine  ? Drug use: No  ? ? ?Current Outpatient Medications  ?Medication Sig Dispense Refill  ? AMBULATORY NON FORMULARY MEDICATION Incontinence pads per patient preference, #unlimited, refill x99 ?Fax to [phone] Units prn  ? ARMOUR THYROID 60 MG tablet Take 1 tablet (60 mg total) by mouth daily before breakfast. 90 tablet PRN  ? aspirin EC 325 MG tablet Take 1 tablet (325 mg total) by mouth daily. (Patient taking differently: Take 325 mg by mouth at bedtime.) 30 tablet 0  ? carvedilol (COREG) 12.5 MG tablet Take 1 tablet (12.5 mg total) by mouth 2 (two) times daily with a meal. 180 tablet 3  ? cetirizine (ZYRTEC) 10 MG tablet Take 10 mg by mouth daily.    ? Continuous Blood Gluc Sensor (DEXCOM G6 SENSOR) MISC USE TO CONTINUOUSLY MONITOR BLOOD SUGARS. CHANGE EVERY 10 DAYS    ? diclofenac Sodium (VOLTAREN) 1 % GEL Apply 2 g topically daily as needed (Pain).    ? diphenoxylate-atropine (LOMOTIL)  2.5-0.025 MG tablet TAKE 1 TABLET BY MOUTH 4 TIMES DAILY AS NEEDED FOR DIARRHEA OR  LOOSE  STOOLS 45 tablet 0  ? esomeprazole (NEXIUM) 20 MG capsule Take 20 mg by mouth every morning.     ? fluocinonide (LIDEX) 0.05 % external solution Apply 1 application topically See admin instructions. Once every three days    ? gabapentin (NEURONTIN) 100 MG capsule Take 1-2 capsules (100-200 mg total) by mouth 3 (three) times daily as needed. 180 capsule 5  ? Insulin Human (INSULIN PUMP) SOLN Inject into the skin continuous. Novolog insulin    ? lidocaine-prilocaine (EMLA) cream Apply 1 application topically every 2 (two)  hours as needed. Please run with good Rx discount coupon 30 g 0  ? lisinopril (ZESTRIL) 10 MG tablet Take 10 mg by mouth daily.    ? modafinil (PROVIGIL) 100 MG tablet Take 1 tablet (100 mg total) by mouth daily. 30 tablet 0  ? nitroGLYCERIN (NITROLINGUAL) 0.4 MG/SPRAY spray Place 1 spray under the tongue every 5 (five) minutes x 3 doses as needed for chest pain. (Patient not taking: Reported on 03/18/2021) 12 g 1  ? NOVOLOG 100 UNIT/ML injection Inject 40 Units into the skin daily.    ? OVER THE COUNTER MEDICATION as directed. Magnesium L-Treonate    ? Pancrelipase, Lip-Prot-Amyl, (CREON) 3000-9500 units CPEP Take 12,000 Units by mouth in the morning, at noon, and at bedtime.    ? PREMARIN vaginal cream Place vaginally.    ? spironolactone (ALDACTONE) 25 MG tablet Take 50 mg by mouth daily.    ? torsemide ([name]) 20 MG tablet TAKE 1 TABLET ONCE DAILY INTHE MORNING. MAY TAKE AN   EXTRA 1/2 TABLET AS        NEEDED (Patient taking differently: Take 20 mg by mouth daily as needed (Water retention).) 135 tablet 0  ? valACYclovir (VALTREX) 500 MG tablet Take 500 mg by mouth daily as needed (Flair up).    ? Vitamin D, Ergocalciferol, (DRISDOL) 1.25 MG (50000 UNIT) CAPS capsule Take 50,000 Units by mouth once a week.    ? ?No current facility-administered medications for this visit.  ? ? ?Allergies  ?Allergen  Reactions  ? Morphine Anaphylaxis and Shortness Of Breath  ? Penicillins Hives  ?  Tolerated ANCEF on 04/30/20 ?Has patient had a PCN reaction causing immediate rash, facial/tongue/throat swelling, SOB or lighthead

## 2021-06-14 ENCOUNTER — Other Ambulatory Visit: Payer: Self-pay | Admitting: Osteopathic Medicine

## 2021-06-16 ENCOUNTER — Encounter (HOSPITAL_BASED_OUTPATIENT_CLINIC_OR_DEPARTMENT_OTHER): Payer: Self-pay | Admitting: Emergency Medicine

## 2021-06-16 ENCOUNTER — Encounter: Payer: Self-pay | Admitting: Hematology & Oncology

## 2021-06-16 ENCOUNTER — Inpatient Hospital Stay (HOSPITAL_BASED_OUTPATIENT_CLINIC_OR_DEPARTMENT_OTHER): Payer: Medicare Other | Admitting: Hematology & Oncology

## 2021-06-16 ENCOUNTER — Inpatient Hospital Stay: Payer: Medicare Other

## 2021-06-16 ENCOUNTER — Emergency Department (HOSPITAL_BASED_OUTPATIENT_CLINIC_OR_DEPARTMENT_OTHER)
Admission: EM | Admit: 2021-06-16 | Discharge: 2021-06-16 | Disposition: A | Discharge status: Home / Self Care | Payer: Medicare Other | Attending: Emergency Medicine | Admitting: Emergency Medicine

## 2021-06-16 ENCOUNTER — Inpatient Hospital Stay: Payer: Medicare Other | Attending: Hematology & Oncology

## 2021-06-16 ENCOUNTER — Telehealth: Payer: Self-pay

## 2021-06-16 ENCOUNTER — Other Ambulatory Visit: Payer: Self-pay

## 2021-06-16 VITALS — BP 151/68 | HR 64 | Temp 97.7°F | Resp 20 | Wt 106.4 lb

## 2021-06-16 DIAGNOSIS — D631 Anemia in chronic kidney disease: Secondary | ICD-10-CM | POA: Insufficient documentation

## 2021-06-16 DIAGNOSIS — N183 Chronic kidney disease, stage 3 unspecified: Secondary | ICD-10-CM

## 2021-06-16 DIAGNOSIS — E1122 Type 2 diabetes mellitus with diabetic chronic kidney disease: Secondary | ICD-10-CM | POA: Diagnosis not present

## 2021-06-16 DIAGNOSIS — N289 Disorder of kidney and ureter, unspecified: Secondary | ICD-10-CM

## 2021-06-16 DIAGNOSIS — Z7982 Long term (current) use of aspirin: Secondary | ICD-10-CM | POA: Insufficient documentation

## 2021-06-16 DIAGNOSIS — N189 Chronic kidney disease, unspecified: Secondary | ICD-10-CM | POA: Diagnosis not present

## 2021-06-16 DIAGNOSIS — Z794 Long term (current) use of insulin: Secondary | ICD-10-CM | POA: Insufficient documentation

## 2021-06-16 DIAGNOSIS — Z79899 Other long term (current) drug therapy: Secondary | ICD-10-CM | POA: Insufficient documentation

## 2021-06-16 DIAGNOSIS — E875 Hyperkalemia: Secondary | ICD-10-CM | POA: Diagnosis present

## 2021-06-16 DIAGNOSIS — D638 Anemia in other chronic diseases classified elsewhere: Secondary | ICD-10-CM

## 2021-06-16 LAB — COMPREHENSIVE METABOLIC PANEL
ALT: 14 U/L (ref 0–44)
AST: 22 U/L (ref 15–41)
Albumin: 3.7 g/dL (ref 3.5–5.0)
Alkaline Phosphatase: 96 U/L (ref 38–126)
Anion gap: 8 (ref 5–15)
BUN: 41 mg/dL — ABNORMAL HIGH (ref 8–23)
CO2: 19 mmol/L — ABNORMAL LOW (ref 22–32)
Calcium: 9 mg/dL (ref 8.9–10.3)
Chloride: 108 mmol/L (ref 98–111)
Creatinine, Ser: 1.92 mg/dL — ABNORMAL HIGH (ref 0.44–1.00)
GFR, Estimated: 28 mL/min — ABNORMAL LOW (ref >60)
Glucose, Bld: 165 mg/dL — ABNORMAL HIGH (ref 70–99)
Potassium: 5.8 mmol/L — ABNORMAL HIGH (ref 3.5–5.1)
Sodium: 135 mmol/L (ref 135–145)
Total Bilirubin: 0.4 mg/dL (ref 0.3–1.2)
Total Protein: 6.8 g/dL (ref 6.5–8.1)

## 2021-06-16 LAB — URINALYSIS, MICROSCOPIC (REFLEX)
RBC / HPF: NONE SEEN RBC/hpf (ref 0–5)
WBC, UA: NONE SEEN WBC/hpf (ref 0–5)

## 2021-06-16 LAB — CBC WITH DIFFERENTIAL (CANCER CENTER ONLY)
Abs Immature Granulocytes: 0.02 10*3/uL (ref 0.00–0.07)
Basophils Absolute: 0.1 10*3/uL (ref 0.0–0.1)
Basophils Relative: 1 %
Eosinophils Absolute: 0.5 10*3/uL (ref 0.0–0.5)
Eosinophils Relative: 7 %
HCT: 31.8 % — ABNORMAL LOW (ref 36.0–46.0)
Hemoglobin: 10 g/dL — ABNORMAL LOW (ref 12.0–15.0)
Immature Granulocytes: 0 %
Lymphocytes Relative: 21 %
Lymphs Abs: 1.5 10*3/uL (ref 0.7–4.0)
MCH: 27.9 pg (ref 26.0–34.0)
MCHC: 31.4 g/dL (ref 30.0–36.0)
MCV: 88.8 fL (ref 80.0–100.0)
Monocytes Absolute: 0.7 10*3/uL (ref 0.1–1.0)
Monocytes Relative: 10 %
Neutro Abs: 4.3 10*3/uL (ref 1.7–7.7)
Neutrophils Relative %: 61 %
Platelet Count: 264 10*3/uL (ref 150–400)
RBC: 3.58 MIL/uL — ABNORMAL LOW (ref 3.87–5.11)
RDW: 14.4 % (ref 11.5–15.5)
WBC Count: 7 10*3/uL (ref 4.0–10.5)
nRBC: 0 % (ref 0.0–0.2)

## 2021-06-16 LAB — MAGNESIUM: Magnesium: 1.9 mg/dL (ref 1.7–2.4)

## 2021-06-16 LAB — URINALYSIS, ROUTINE W REFLEX MICROSCOPIC
Bilirubin Urine: NEGATIVE
Glucose, UA: NEGATIVE mg/dL
Hgb urine dipstick: NEGATIVE
Ketones, ur: NEGATIVE mg/dL
Leukocytes,Ua: NEGATIVE
Nitrite: NEGATIVE
Protein, ur: 100 mg/dL — AB
Specific Gravity, Urine: 1.01 (ref 1.005–1.030)
pH: 5.5 (ref 5.0–8.0)

## 2021-06-16 LAB — RETICULOCYTES
Immature Retic Fract: 7.7 % (ref 2.3–15.9)
RBC.: 3.58 MIL/uL — ABNORMAL LOW (ref 3.87–5.11)
Retic Count, Absolute: 22.9 10*3/uL (ref 19.0–186.0)
Retic Ct Pct: 0.6 % (ref 0.4–3.1)

## 2021-06-16 LAB — CBC WITH DIFFERENTIAL/PLATELET
Abs Immature Granulocytes: 0.01 10*3/uL (ref 0.00–0.07)
Basophils Absolute: 0.1 10*3/uL (ref 0.0–0.1)
Basophils Relative: 1 %
Eosinophils Absolute: 0.4 10*3/uL (ref 0.0–0.5)
Eosinophils Relative: 6 %
HCT: 34.1 % — ABNORMAL LOW (ref 36.0–46.0)
Hemoglobin: 10.8 g/dL — ABNORMAL LOW (ref 12.0–15.0)
Immature Granulocytes: 0 %
Lymphocytes Relative: 24 %
Lymphs Abs: 1.8 10*3/uL (ref 0.7–4.0)
MCH: 27.9 pg (ref 26.0–34.0)
MCHC: 31.7 g/dL (ref 30.0–36.0)
MCV: 88.1 fL (ref 80.0–100.0)
Monocytes Absolute: 0.7 10*3/uL (ref 0.1–1.0)
Monocytes Relative: 10 %
Neutro Abs: 4.4 10*3/uL (ref 1.7–7.7)
Neutrophils Relative %: 59 %
Platelets: 290 10*3/uL (ref 150–400)
RBC: 3.87 MIL/uL (ref 3.87–5.11)
RDW: 14.5 % (ref 11.5–15.5)
WBC: 7.3 10*3/uL (ref 4.0–10.5)
nRBC: 0 % (ref 0.0–0.2)

## 2021-06-16 LAB — CMP (CANCER CENTER ONLY)
ALT: 10 U/L (ref 0–44)
AST: 18 U/L (ref 15–41)
Albumin: 3.8 g/dL (ref 3.5–5.0)
Alkaline Phosphatase: 89 U/L (ref 38–126)
Anion gap: 6 (ref 5–15)
BUN: 43 mg/dL — ABNORMAL HIGH (ref 8–23)
CO2: 21 mmol/L — ABNORMAL LOW (ref 22–32)
Calcium: 9 mg/dL (ref 8.9–10.3)
Chloride: 105 mmol/L (ref 98–111)
Creatinine: 1.97 mg/dL — ABNORMAL HIGH (ref 0.44–1.00)
GFR, Estimated: 28 mL/min — ABNORMAL LOW (ref >60)
Glucose, Bld: 257 mg/dL — ABNORMAL HIGH (ref 70–99)
Potassium: 6.5 mmol/L (ref 3.5–5.1)
Sodium: 132 mmol/L — ABNORMAL LOW (ref 135–145)
Total Bilirubin: 0.2 mg/dL — ABNORMAL LOW (ref 0.3–1.2)
Total Protein: 6.3 g/dL — ABNORMAL LOW (ref 6.5–8.1)

## 2021-06-16 LAB — BASIC METABOLIC PANEL
Anion gap: 8 (ref 5–15)
BUN: 40 mg/dL — ABNORMAL HIGH (ref 8–23)
CO2: 16 mmol/L — ABNORMAL LOW (ref 22–32)
Calcium: 8.9 mg/dL (ref 8.9–10.3)
Chloride: 113 mmol/L — ABNORMAL HIGH (ref 98–111)
Creatinine, Ser: 1.84 mg/dL — ABNORMAL HIGH (ref 0.44–1.00)
GFR, Estimated: 30 mL/min — ABNORMAL LOW (ref >60)
Glucose, Bld: 124 mg/dL — ABNORMAL HIGH (ref 70–99)
Potassium: 6.2 mmol/L — ABNORMAL HIGH (ref 3.5–5.1)
Sodium: 137 mmol/L (ref 135–145)

## 2021-06-16 LAB — FERRITIN: Ferritin: 71 ng/mL (ref 11–307)

## 2021-06-16 MED ORDER — LOKELMA 10 G PO PACK: 10.0000 g | PACK | Freq: Three times a day (TID) | ORAL | 0 refills | Status: AC

## 2021-06-16 MED ORDER — SODIUM CHLORIDE 0.9 % IV BOLUS
1000.0000 mL | Freq: Once | INTRAVENOUS | Status: AC
Start: 2021-06-16 — End: 2021-06-16
  Administered 2021-06-16: 1000 mL via INTRAVENOUS

## 2021-06-16 MED ORDER — SODIUM ZIRCONIUM CYCLOSILICATE 10 G PO PACK
10.0000 g | PACK | Freq: Once | ORAL | Status: AC
  Administered 2021-06-16: 10 g via ORAL
  Filled 2021-06-16: qty 1

## 2021-06-16 MED ORDER — DARBEPOETIN ALFA 300 MCG/0.6ML IJ SOSY
300.0000 ug | PREFILLED_SYRINGE | Freq: Once | INTRAMUSCULAR | Status: AC
  Administered 2021-06-16: 300 ug via SUBCUTANEOUS
  Filled 2021-06-16: qty 0.6

## 2021-06-16 MED ORDER — SODIUM BICARBONATE 8.4 % IV SOLN
50.0000 meq | Freq: Once | INTRAVENOUS | Status: AC
  Administered 2021-06-16: 50 meq via INTRAVENOUS
  Filled 2021-06-16: qty 50

## 2021-06-16 NOTE — ED Triage Notes (Signed)
Pt sent by oncologist for elevated K+

## 2021-06-16 NOTE — ED Notes (Signed)
Patient increasingly agitated. Patient states "I am just so over this. I am done". Patient looks at this RN and states "its not you, you've been great I'm just sick of being here". Patient requesting to not be monitored. Educated on importance of same. Yao MD, notified of patients agitation and asked to speak to patient. MD currently at bedside.

## 2021-06-16 NOTE — ED Notes (Signed)
Husband ambulatory to nurses station. He reports patients blood sugar is now 80 and she needs something to eat. Patient given apple juice and peanut butter crackers.

## 2021-06-16 NOTE — Progress Notes (Signed)
Hematology and Oncology Follow Up Visit  Brittney Pace [id number] [birth year] 66 y.o. 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  The problem that we have today is that her potassium is quite high.  Potassium is 6.5.  She is not taking any potassium.  She is on spironolactone and lisinopril.  She says she has some ringing in the ears.  We will going to have to get her down to the ER.  This is some that she will need to be taken care of down in the ER may be in the hospital.  Patient has diabetes.  Blood sugar today is 257.  Her BUN and creatinine are also elevated a little bit.  This may be hurting the potassium a little bit.  Her last iron studies back in April showed a ferritin of 28 with an iron saturation of 51%.  I am somewhat surprised because she has diarrhea.  I would have thought that the diarrhea would have caused potassium to be on the lower side.  That she has she has seen Brittney Pace of Gastroenterology.  He is doing some test to help with the diarrhea.  Hopefully, she does not have chronic C. difficile.  She has had no fever.  She has had no bleeding.  She has had no cough or shortness of breath.  There is been no rashes.  She is gaining a little bit of weight.  She has been eating a little bit more.  Overall, I would say performance status is probably ECOG 1.    Overall, I would have said that her performance status is probably ECOG 1.    Medications:  Allergies as of 06/16/2021       Reactions   Morphine Anaphylaxis, Shortness Of Breath   Penicillins Hives   Tolerated ANCEF on 04/30/20 Has patient had a PCN reaction causing immediate rash, facial/tongue/throat swelling, SOB or lightheadedness with hypotension: Yes Has patient had a PCN  reaction causing severe rash involving mucus membranes or skin necrosis: No Has patient had a PCN reaction that required hospitalization No Has patient had a PCN reaction occurring within the last 10 years: No If all of the above answers are "NO", then may proceed with Cephalosporin use.   Tramadol Anaphylaxis   Acetaminophen Other (See Comments)   Alters insulin pump readings   Amitriptyline Other (See Comments)   Severe headache/ out of body feeling   Codeine Other (See Comments)   Severe headaches/ out of body feeling   Ibuprofen Other (See Comments)   Messes up CGM reading on glucose monitor   Krill Oil Diarrhea, Itching, Nausea And Vomiting   Losartan Cough   Propoxyphene Other (See Comments)   Severe headaches / out of body feeling   Shellfish Allergy Diarrhea, Nausea And Vomiting   Statins Other (See Comments)   Muscle pains Other reaction(s): Other   Sulfamethoxazole Other (See Comments)   Mouth ulcers   Fluconazole    Other reaction(s): Contact Dermatitis (intolerance)   Norvasc [amlodipine Besylate] Swelling        Medication List        Accurate as of Jun 16, 2021  2:23 PM. If you have any questions, ask your nurse or doctor.  Accu-Chek Guide test strip Generic drug: glucose blood 3 (three) times daily.   AMBULATORY NON FORMULARY MEDICATION Incontinence pads per patient preference, #unlimited, refill x99 Fax to [phone]   aspirin EC 325 MG tablet Take 1 tablet (325 mg total) by mouth daily. What changed: when to take this   carvedilol 12.5 MG tablet Commonly known as: COREG Take 1 tablet (12.5 mg total) by mouth 2 (two) times daily with a meal.   cetirizine 10 MG tablet Commonly known as: ZYRTEC Take 10 mg by mouth daily.   Creon 3000-9500 units Cpep Generic drug: Pancrelipase (Lip-Prot-Amyl) Take 12,000 Units by mouth in the morning, at noon, and at bedtime.   Dexcom G6 Sensor Misc USE TO CONTINUOUSLY MONITOR BLOOD SUGARS.  CHANGE EVERY 10 DAYS   diclofenac Sodium 1 % Gel Commonly known as: VOLTAREN Apply 2 g topically daily as needed (Pain).   diphenoxylate-atropine 2.5-0.025 MG tablet Commonly known as: LOMOTIL TAKE 1 TABLET BY MOUTH 4 TIMES DAILY AS NEEDED FOR DIARRHEA OR  LOOSE  STOOLS   esomeprazole 20 MG capsule Commonly known as: NEXIUM Take 20 mg by mouth every morning.   fluocinonide 0.05 % external solution Commonly known as: LIDEX Apply 1 application topically See admin instructions. Once every three days   gabapentin 100 MG capsule Commonly known as: NEURONTIN Take 1-2 capsules (100-200 mg total) by mouth 3 (three) times daily as needed.   insulin pump Soln Inject into the skin continuous. Novolog insulin   lidocaine-prilocaine cream Commonly known as: EMLA Apply 1 application topically every 2 (two) hours as needed. Please run with good Rx discount coupon   lisinopril 10 MG tablet Commonly known as: ZESTRIL Take 10 mg by mouth daily.   modafinil 100 MG tablet Commonly known as: Provigil Take 1 tablet (100 mg total) by mouth daily.   nitroGLYCERIN 0.4 MG/SPRAY spray Commonly known as: NITROLINGUAL Place 1 spray under the tongue every 5 (five) minutes x 3 doses as needed for chest pain.   NovoLOG 100 UNIT/ML injection Generic drug: insulin aspart Inject 40 Units into the skin daily.   OVER THE COUNTER MEDICATION daily. Magnesium L-Treonate   Premarin vaginal cream Generic drug: conjugated estrogens every other day.   spironolactone 25 MG tablet Commonly known as: ALDACTONE Take 50 mg by mouth daily.   thyroid 60 MG tablet Commonly known as: NP Thyroid Take 1 tablet (60 mg total) by mouth daily with breakfast. LAST REFILL. NEEDS TO TRANSFER CARE TO NEW PCP   torsemide 20 MG tablet Commonly known as: [name] TAKE 1 TABLET ONCE DAILY INTHE MORNING. MAY TAKE AN   EXTRA 1/2 TABLET AS        NEEDED   valACYclovir 500 MG tablet Commonly known as: VALTREX Take 500 mg  by mouth daily as needed (Flair up).   Vitamin D (Ergocalciferol) 1.25 MG (50000 UNIT) Caps capsule Commonly known as: DRISDOL Take 50,000 Units by mouth once a week.        Allergies:  Allergies  Allergen Reactions   Morphine Anaphylaxis and Shortness Of Breath   Penicillins Hives    Tolerated ANCEF on 04/30/20 Has patient had a PCN reaction causing immediate rash, facial/tongue/throat swelling, SOB or lightheadedness with hypotension: Yes Has patient had a PCN reaction causing severe rash involving mucus membranes or skin necrosis: No Has patient had a PCN reaction that required hospitalization No Has patient had a PCN reaction occurring within the last 10 years: No If all of the above answers are "NO",  then may proceed with Cephalosporin use.   Tramadol Anaphylaxis   Acetaminophen Other (See Comments)    Alters insulin pump readings    Amitriptyline Other (See Comments)    Severe headache/ out of body feeling   Codeine Other (See Comments)    Severe headaches/ out of body feeling    Ibuprofen Other (See Comments)    Messes up CGM reading on glucose monitor     Krill Oil Diarrhea, Itching and Nausea And Vomiting   Losartan Cough   Propoxyphene Other (See Comments)    Severe headaches / out of body feeling    Shellfish Allergy Diarrhea and Nausea And Vomiting   Statins Other (See Comments)    Muscle pains Other reaction(s): Other   Sulfamethoxazole Other (See Comments)    Mouth ulcers    Fluconazole     Other reaction(s): Contact Dermatitis (intolerance)   Norvasc [Amlodipine Besylate] Swelling    Past Medical History, Surgical history, Social history, and Family History were reviewed and updated.  Review of Systems: Review of Systems  Constitutional:  Positive for malaise/fatigue.  HENT: Negative.    Eyes: Negative.   Respiratory: Negative.    Cardiovascular:  Positive for leg swelling.  Gastrointestinal:  Positive for abdominal pain.  Genitourinary:  Negative.   Musculoskeletal:  Positive for joint pain and myalgias.  Skin:  Positive for rash.  Neurological: Negative.   Endo/Heme/Allergies: Negative.   Psychiatric/Behavioral: Negative.      Physical Exam:  weight is 106 lb 6.4 oz (48.3 kg). Her oral temperature is 97.7 F (36.5 C). Her blood pressure is 151/68 (abnormal) and her pulse is 64. Her respiration is 20 and oxygen saturation is 95%.   Wt Readings from Last 3 Encounters:  06/16/21 106 lb 6.4 oz (48.3 kg)  06/04/21 105 lb (47.6 kg)  05/14/21 102 lb (46.3 kg)    Physical Exam Vitals reviewed.  HENT:     Head: Normocephalic and atraumatic.  Eyes:     Pupils: Pupils are equal, round, and reactive to light.  Cardiovascular:     Rate and Rhythm: Normal rate and regular rhythm.     Heart sounds: Normal heart sounds.  Pulmonary:     Effort: Pulmonary effort is normal.     Breath sounds: Normal breath sounds.  Abdominal:     General: Bowel sounds are normal.     Palpations: Abdomen is soft.  Musculoskeletal:        General: No tenderness or deformity. Normal range of motion.     Cervical back: Normal range of motion.     Comments: On her extremities, there is some edema in her legs.  She has about 2+ edema.  She has a macular type reddish rash in the lower extremities.  This is in the areas above her ankles.  There is a couple areas that might be a little bit open.  Lymphadenopathy:     Cervical: No cervical adenopathy.  Skin:    General: Skin is warm and dry.     Findings: No erythema or rash.  Neurological:     Mental Status: She is alert and oriented to person, place, and time.  Psychiatric:        Behavior: Behavior normal.        Thought Content: Thought content normal.        Judgment: Judgment normal.    Lab Results  Component Value Date   WBC 7.0 06/16/2021   HGB 10.0 (L) 06/16/2021   HCT  31.8 (L) 06/16/2021   MCV 88.8 06/16/2021   PLT 264 06/16/2021   Lab Results  Component Value Date    FERRITIN 28 05/14/2021   IRON 181 (H) 05/14/2021   TIBC 356 05/14/2021   UIBC 175 05/14/2021   IRONPCTSAT 51 (H) 05/14/2021   Lab Results  Component Value Date   RETICCTPCT 0.6 06/16/2021   RBC 3.58 (L) 06/16/2021   No results found for: KPAFRELGTCHN, LAMBDASER, KAPLAMBRATIO No results found for: IGGSERUM, IGA, IGMSERUM No results found for: Kathrynn Ducking, MSPIKE, SPEI   Chemistry      Component Value Date/Time   NA 132 (L) 06/16/2021 1312   NA 138 07/17/2017 1128   K 6.5 (HH) 06/16/2021 1312   CL 105 06/16/2021 1312   CO2 21 (L) 06/16/2021 1312   BUN 43 (H) 06/16/2021 1312   BUN 23 07/17/2017 1128   CREATININE 1.97 (H) 06/16/2021 1312   CREATININE 0.99 07/20/2018 0821      Component Value Date/Time   CALCIUM 9.0 06/16/2021 1312   ALKPHOS 89 06/16/2021 1312   AST 18 06/16/2021 1312   ALT 10 06/16/2021 1312   BILITOT 0.2 (L) 06/16/2021 1312       Impression and Plan: Brittney Pace is a very pleasant 66 yo female with multifactorial anemia.  The hemoglobin certainly has improved.  However, she will need her Aranesp today.  Again, she needs to get to the ER.  Hopefully, they can help with the hyperkalemia..  She does have a nephrologist.  I would think that she will need to be referred back to him.  I told her to stop the spironolactone.  We will plan to get her back in about a month or so.     [name], MD 5/24/20232:23 PM

## 2021-06-16 NOTE — ED Notes (Signed)
Notified PT urine sample is needed. PT will call out when she is ready to provide.

## 2021-06-16 NOTE — Telephone Encounter (Signed)
Dr Marin Olp aware of critical high K+ 6.5. MD to address during office visit. dph

## 2021-06-16 NOTE — Discharge Instructions (Signed)
Your potassium is elevated.  Please stop lisinopril for now.  Take Lokelma 10 g 3 times daily for the next 3 days.  You need to avoid food that is high in potassium such as beans and bananas  You need to see your doctor in 2 days to repeat a potassium  Return to ER if you are unable to urinate, weakness or chest pain

## 2021-06-16 NOTE — Patient Instructions (Signed)
Darbepoetin Alfa injection ?What is this medication? ?DARBEPOETIN ALFA (dar be POE e tin  AL fa) helps your body make more red blood cells. It is used to treat anemia caused by chronic kidney failure and chemotherapy. ?This medicine may be used for other purposes; ask your health care provider or pharmacist if you have questions. ?COMMON BRAND NAME(S): Aranesp ?What should I tell my care team before I take this medication? ?They need to know if you have any of these conditions: ?blood clotting disorders or history of blood clots ?cancer patient not on chemotherapy ?cystic fibrosis ?heart disease, such as angina, heart failure, or a history of a heart attack ?hemoglobin level of 12 g/dL or greater ?high blood pressure ?low levels of folate, iron, or vitamin B12 ?seizures ?an unusual or allergic reaction to darbepoetin, erythropoietin, albumin, hamster proteins, latex, other medicines, foods, dyes, or preservatives ?pregnant or trying to get pregnant ?breast-feeding ?How should I use this medication? ?This medicine is for injection into a vein or under the skin. It is usually given by a health care professional in a hospital or clinic setting. ?If you get this medicine at home, you will be taught how to prepare and give this medicine. Use exactly as directed. Take your medicine at regular intervals. Do not take your medicine more often than directed. ?It is important that you put your used needles and syringes in a special sharps container. Do not put them in a trash can. If you do not have a sharps container, call your pharmacist or healthcare provider to get one. ?A special MedGuide will be given to you by the pharmacist with each prescription and refill. Be sure to read this information carefully each time. ?Talk to your pediatrician regarding the use of this medicine in children. While this medicine may be used in children as young as 1 month of age for selected conditions, precautions do apply. ?Overdosage: If  you think you have taken too much of this medicine contact a poison control center or emergency room at once. ?NOTE: This medicine is only for you. Do not share this medicine with others. ?What if I miss a dose? ?If you miss a dose, take it as soon as you can. If it is almost time for your next dose, take only that dose. Do not take double or extra doses. ?What may interact with this medication? ?Do not take this medicine with any of the following medications: ?epoetin alfa ?This list may not describe all possible interactions. Give your health care provider a list of all the medicines, herbs, non-prescription drugs, or dietary supplements you use. Also tell them if you smoke, drink alcohol, or use illegal drugs. Some items may interact with your medicine. ?What should I watch for while using this medication? ?Your condition will be monitored carefully while you are receiving this medicine. ?You may need blood work done while you are taking this medicine. ?This medicine may cause a decrease in vitamin B6. You should make sure that you get enough vitamin B6 while you are taking this medicine. Discuss the foods you eat and the vitamins you take with your health care professional. ?What side effects may I notice from receiving this medication? ?Side effects that you should report to your doctor or health care professional as soon as possible: ?allergic reactions like skin rash, itching or hives, swelling of the face, lips, or tongue ?breathing problems ?changes in vision ?chest pain ?confusion, trouble speaking or understanding ?feeling faint or lightheaded, falls ?high blood   pressure ?muscle aches or pains ?pain, swelling, warmth in the leg ?rapid weight gain ?severe headaches ?sudden numbness or weakness of the face, arm or leg ?trouble walking, dizziness, loss of balance or coordination ?seizures (convulsions) ?swelling of the ankles, feet, hands ?unusually weak or tired ?Side effects that usually do not require  medical attention (report to your doctor or health care professional if they continue or are bothersome): ?diarrhea ?fever, chills (flu-like symptoms) ?headaches ?nausea, vomiting ?redness, stinging, or swelling at site where injected ?This list may not describe all possible side effects. Call your doctor for medical advice about side effects. You may report side effects to FDA at 1-800-FDA-1088. ?Where should I keep my medication? ?Keep out of the reach of children. ?Store in a refrigerator between 2 and 8 degrees C (36 and 46 degrees F). Do not freeze. Do not shake. Throw away any unused portion if using a single-dose vial. Throw away any unused medicine after the expiration date. ?NOTE: This sheet is a summary. It may not cover all possible information. If you have questions about this medicine, talk to your doctor, pharmacist, or health care provider. ?? 2022 [employer] Standard (2017-01-30 00:00:00) ? ?

## 2021-06-16 NOTE — ED Notes (Signed)
Patient has removed BP cuff. Refusing BP to be obtained at this time.

## 2021-06-16 NOTE — ED Provider Notes (Signed)
[facility] EMERGENCY DEPARTMENT Provider Note   CSN: [id number] Arrival date & time: 06/16/21  1451     History  Chief Complaint  Patient presents with   Hyperkalemia    Brittney Pace is a 66 y.o. female history of diabetes, CKD, here presenting with hyperkalemia.  Patient states that she gets regular injections for renal failure.  She was at oncology office today for her injection.  She has routine blood drawn that showed potassium of 6.7.  Patient states that she has baseline renal failure but is not on dialysis.  She does not have any graft for dialysis either.  Patient states that she has been urinating little but denies any vomiting or abdominal pain.  Denies any chest pain or shortness of breath.   The history is provided by the patient.      Home Medications Prior to Admission medications   Medication Sig Start Date End Date Taking? Authorizing Provider  ACCU-CHEK GUIDE test strip 3 (three) times daily. 05/03/21   [provider]  AMBULATORY NON FORMULARY MEDICATION Incontinence pads per patient preference, #unlimited, refill x99 Fax to [phone] 12/25/18   Emeterio Reeve, DO  aspirin EC 325 MG tablet Take 1 tablet (325 mg total) by mouth daily. Patient taking differently: Take 325 mg by mouth at bedtime. 03/06/19   Lelon Perla, MD  carvedilol (COREG) 12.5 MG tablet Take 1 tablet (12.5 mg total) by mouth 2 (two) times daily with a meal. 05/07/21   Crenshaw, Denice Bors, MD  cetirizine (ZYRTEC) 10 MG tablet Take 10 mg by mouth daily. 03/26/21   [provider]  Continuous Blood Gluc Sensor (DEXCOM G6 SENSOR) MISC USE TO CONTINUOUSLY MONITOR BLOOD SUGARS. CHANGE EVERY 10 DAYS 04/02/19   [provider]  diclofenac Sodium (VOLTAREN) 1 % GEL Apply 2 g topically daily as needed (Pain). Patient not taking: Reported on 06/16/2021    [provider]  diphenoxylate-atropine (LOMOTIL) 2.5-0.025 MG tablet TAKE 1 TABLET BY MOUTH 4 TIMES  DAILY AS NEEDED FOR DIARRHEA OR  LOOSE  STOOLS 12/24/20   [name], MD  esomeprazole (NEXIUM) 20 MG capsule Take 20 mg by mouth every morning.     [provider]  fluocinonide (LIDEX) 0.05 % external solution Apply 1 application topically See admin instructions. Once every three days Patient not taking: Reported on 06/16/2021    [provider]  gabapentin (NEURONTIN) 100 MG capsule Take 1-2 capsules (100-200 mg total) by mouth 3 (three) times daily as needed. Patient not taking: Reported on 06/16/2021 10/04/19   Samuel Bouche, NP  Insulin Human (INSULIN PUMP) SOLN Inject into the skin continuous. Novolog insulin    [provider]  lidocaine-prilocaine (EMLA) cream Apply 1 application topically every 2 (two) hours as needed. Please run with good Rx discount coupon Patient not taking: Reported on 06/16/2021 11/08/19   Emeterio Reeve, DO  lisinopril (ZESTRIL) 10 MG tablet Take 10 mg by mouth daily. 03/24/21   [provider]  modafinil (PROVIGIL) 100 MG tablet Take 1 tablet (100 mg total) by mouth daily. Patient not taking: Reported on 06/16/2021 05/26/21   [name], MD  nitroGLYCERIN (NITROLINGUAL) 0.4 MG/SPRAY spray Place 1 spray under the tongue every 5 (five) minutes x 3 doses as needed for chest pain. Patient not taking: Reported on 03/18/2021 03/06/19   Lelon Perla, MD  NOVOLOG 100 UNIT/ML injection Inject 40 Units into the skin daily. Patient not taking: Reported on 06/16/2021 06/08/20   [provider]  OVER THE COUNTER MEDICATION daily. Magnesium L-Treonate    [provider]  Pancrelipase, Lip-Prot-Amyl, (CREON) 3000-9500 units CPEP Take 12,000 Units by mouth in the morning, at noon, and at bedtime. 04/14/21   [provider]  PREMARIN vaginal cream every other day. 03/24/21   [provider]  spironolactone (ALDACTONE) 25 MG tablet Take 50 mg by mouth daily. 05/09/19   [provider]  thyroid (NP  THYROID) 60 MG tablet Take 1 tablet (60 mg total) by mouth daily with breakfast. LAST REFILL. NEEDS TO TRANSFER CARE TO NEW PCP 06/14/21   Hali Marry, MD  torsemide ([name]) 20 MG tablet TAKE 1 TABLET ONCE DAILY INTHE MORNING. MAY TAKE AN   EXTRA 1/2 TABLET AS        NEEDED Patient not taking: Reported on 06/16/2021 01/15/18   Trixie Dredge, PA-C  valACYclovir (VALTREX) 500 MG tablet Take 500 mg by mouth daily as needed (Flair up). Patient not taking: Reported on 06/16/2021    [provider]  Vitamin D, Ergocalciferol, (DRISDOL) 1.25 MG (50000 UNIT) CAPS capsule Take 50,000 Units by mouth once a week. 03/26/21   [provider]      Allergies    Morphine, Penicillins, Tramadol, Acetaminophen, Amitriptyline, Codeine, Ibuprofen, Krill oil, Losartan, Propoxyphene, Shellfish allergy, Statins, Sulfamethoxazole, Fluconazole, and Norvasc [amlodipine besylate]    Review of Systems   Review of Systems  Constitutional:  Positive for fatigue.  All other systems reviewed and are negative.  Physical Exam Updated Vital Signs BP (!) 184/73 (BP Location: Right Arm)   Pulse (!) 57   Temp 98.4 F (36.9 C) (Oral)   Resp 20   Ht '4\' 10"'$  (1.473 m)   Wt 48.1 kg   SpO2 96%   BMI 22.15 kg/m  Physical Exam Vitals and nursing note reviewed.  Constitutional:      Appearance: Normal appearance.  HENT:     Head: Normocephalic.     Nose: Nose normal.     Mouth/Throat:     Mouth: Mucous membranes are dry.  Eyes:     Extraocular Movements: Extraocular movements intact.     Pupils: Pupils are equal, round, and reactive to light.  Cardiovascular:     Rate and Rhythm: Normal rate and regular rhythm.     Pulses: Normal pulses.     Heart sounds: Normal heart sounds.  Pulmonary:     Effort: Pulmonary effort is normal.     Breath sounds: Normal breath sounds.  Abdominal:     General: Abdomen is flat.     Palpations: Abdomen is soft.  Musculoskeletal:        General:  Normal range of motion.     Cervical back: Normal range of motion and neck supple.  Skin:    General: Skin is warm.     Capillary Refill: Capillary refill takes less than 2 seconds.  Neurological:     General: No focal deficit present.     Mental Status: She is alert and oriented to person, place, and time.  Psychiatric:        Mood and Affect: Mood normal.        Behavior: Behavior normal.    ED Results / Procedures / Treatments   Labs (all labs ordered are listed, but only abnormal results are displayed) Labs Reviewed  CBC WITH DIFFERENTIAL/PLATELET - Abnormal; Notable for the following components:      Result Value   Hemoglobin 10.8 (*)  HCT 34.1 (*)    All other components within normal limits  COMPREHENSIVE METABOLIC PANEL  MAGNESIUM  URINALYSIS, ROUTINE W REFLEX MICROSCOPIC    EKG EKG Interpretation  Date/Time:  Wednesday Jun 16 2021 15:17:48 EDT Ventricular Rate:  60 PR Interval:  205 QRS Duration: 84 QT Interval:  397 QTC Calculation: 397 R Axis:   87 Text Interpretation: Sinus rhythm Borderline right axis deviation No significant change since last tracing Confirmed by Wandra Arthurs [phone]) on 06/16/2021 3:34:41 PM  Radiology No results found.  Procedures Procedures    Medications Ordered in ED Medications  sodium chloride 0.9 % bolus 1,000 mL (1,000 mLs Intravenous New Bag/Given 06/16/21 1516)    ED Course/ Medical Decision Making/ A&P                           Medical Decision Making Brittney Pace is a 66 y.o. female here presenting with hyperkalemia.  Patient was noted to be hyperkalemic on routine blood work.  Patient has chronic CKD likely causing her hyperkalemia.  Patient is not on dialysis.  I wonder if this is in a lab error or the previous sample hemolyzed. Plan to recheck CMP.  If potassium still elevated, patient likely needs Lokelma and possible admission.  5:38 PM I reviewed patient's labs.  Patient's initial potassium was 5.8.  I  discussed with the on-call nephrologist at [facility].  They agreed with [facility] and bicarb.  They requested a repeat potassium level.  Repeat potassium is 6.2.  However patient's IV is hurting her and the second potassium was drawn from the IV.  I think the second potassium likely is hemolyzed as she received all the medicines.  Patient does not have any peaked Ts on EKG.  I suggested that patient should stay in the hospital for hyper kalemia.  However she states that she does not want to stay in the hospital. I will prescribe Lokelma 10 g 3 times daily.  She states that she has PCP follow-up on Friday and can get repeat blood work then.  I told her that if her repeat blood work shows that her potassium is still above 6, she needs to be admitted to the hospital.  Problems Addressed: Hyperkalemia: acute illness or injury Renal insufficiency: acute illness or injury  Amount and/or Complexity of Data Reviewed Labs: ordered. Decision-making details documented in ED Course. ECG/medicine tests: ordered and independent interpretation performed. Decision-making details documented in ED Course.  Risk Prescription drug management.    Final Clinical Impression(s) / ED Diagnoses Final diagnoses:  None    Rx / DC Orders ED Discharge Orders     None         Drenda Freeze, MD 06/16/21 1745

## 2021-06-17 ENCOUNTER — Encounter: Payer: Self-pay | Admitting: *Deleted

## 2021-06-17 DIAGNOSIS — F0390 Unspecified dementia without behavioral disturbance: Secondary | ICD-10-CM | POA: Insufficient documentation

## 2021-06-17 DIAGNOSIS — G3184 Mild cognitive impairment, so stated: Secondary | ICD-10-CM | POA: Insufficient documentation

## 2021-06-17 LAB — IRON AND IRON BINDING CAPACITY (CC-WL,HP ONLY)
Iron: 102 ug/dL (ref 28–170)
Saturation Ratios: 32 % — ABNORMAL HIGH (ref 10.4–31.8)
TIBC: 316 ug/dL (ref 250–450)
UIBC: 214 ug/dL (ref 148–442)

## 2021-06-18 ENCOUNTER — Other Ambulatory Visit: Payer: Medicare Other

## 2021-06-18 DIAGNOSIS — K580 Irritable bowel syndrome with diarrhea: Secondary | ICD-10-CM

## 2021-06-18 DIAGNOSIS — K6389 Other specified diseases of intestine: Secondary | ICD-10-CM

## 2021-06-21 LAB — GI PROFILE, STOOL, PCR

## 2021-06-24 LAB — FECAL FAT, QUALITATIVE: FECAL FAT, QUALITATIVE: NORMAL

## 2021-06-26 LAB — PANCREATIC ELASTASE, FECAL: Pancreatic Elastase-1, Stool: 380 ug/g

## 2021-06-30 LAB — CALPROTECTIN, FECAL: Calprotectin, Fecal: 17 ug/g (ref 0–120)

## 2021-07-21 ENCOUNTER — Encounter: Payer: Self-pay | Admitting: Hematology & Oncology

## 2021-07-21 ENCOUNTER — Inpatient Hospital Stay (HOSPITAL_BASED_OUTPATIENT_CLINIC_OR_DEPARTMENT_OTHER): Payer: Medicare Other | Admitting: Hematology & Oncology

## 2021-07-21 ENCOUNTER — Inpatient Hospital Stay: Payer: Medicare Other

## 2021-07-21 ENCOUNTER — Inpatient Hospital Stay: Payer: Medicare Other | Attending: Hematology & Oncology

## 2021-07-21 VITALS — BP 175/74 | HR 62 | Temp 97.9°F | Resp 18 | Wt 107.0 lb

## 2021-07-21 DIAGNOSIS — Z794 Long term (current) use of insulin: Secondary | ICD-10-CM | POA: Insufficient documentation

## 2021-07-21 DIAGNOSIS — D631 Anemia in chronic kidney disease: Secondary | ICD-10-CM

## 2021-07-21 DIAGNOSIS — Z79899 Other long term (current) drug therapy: Secondary | ICD-10-CM | POA: Insufficient documentation

## 2021-07-21 DIAGNOSIS — E1165 Type 2 diabetes mellitus with hyperglycemia: Secondary | ICD-10-CM | POA: Diagnosis not present

## 2021-07-21 DIAGNOSIS — D460 Refractory anemia without ring sideroblasts, so stated: Secondary | ICD-10-CM | POA: Diagnosis present

## 2021-07-21 DIAGNOSIS — D638 Anemia in other chronic diseases classified elsewhere: Secondary | ICD-10-CM

## 2021-07-21 DIAGNOSIS — Z8673 Personal history of transient ischemic attack (TIA), and cerebral infarction without residual deficits: Secondary | ICD-10-CM | POA: Insufficient documentation

## 2021-07-21 DIAGNOSIS — D500 Iron deficiency anemia secondary to blood loss (chronic): Secondary | ICD-10-CM

## 2021-07-21 DIAGNOSIS — E1022 Type 1 diabetes mellitus with diabetic chronic kidney disease: Secondary | ICD-10-CM

## 2021-07-21 LAB — CBC WITH DIFFERENTIAL (CANCER CENTER ONLY)
Abs Immature Granulocytes: 0.12 10*3/uL — ABNORMAL HIGH (ref 0.00–0.07)
Basophils Absolute: 0.1 10*3/uL (ref 0.0–0.1)
Basophils Relative: 1 %
Eosinophils Absolute: 0.5 10*3/uL (ref 0.0–0.5)
Eosinophils Relative: 6 %
HCT: 32.3 % — ABNORMAL LOW (ref 36.0–46.0)
Hemoglobin: 10.1 g/dL — ABNORMAL LOW (ref 12.0–15.0)
Immature Granulocytes: 2 %
Lymphocytes Relative: 21 %
Lymphs Abs: 1.6 10*3/uL (ref 0.7–4.0)
MCH: 26.4 pg (ref 26.0–34.0)
MCHC: 31.3 g/dL (ref 30.0–36.0)
MCV: 84.6 fL (ref 80.0–100.0)
Monocytes Absolute: 0.6 10*3/uL (ref 0.1–1.0)
Monocytes Relative: 8 %
Neutro Abs: 4.8 10*3/uL (ref 1.7–7.7)
Neutrophils Relative %: 62 %
Platelet Count: 288 10*3/uL (ref 150–400)
RBC: 3.82 MIL/uL — ABNORMAL LOW (ref 3.87–5.11)
RDW: 15.1 % (ref 11.5–15.5)
WBC Count: 7.7 10*3/uL (ref 4.0–10.5)
nRBC: 0 % (ref 0.0–0.2)

## 2021-07-21 LAB — RETICULOCYTES
Immature Retic Fract: 5.1 % (ref 2.3–15.9)
RBC.: 3.79 MIL/uL — ABNORMAL LOW (ref 3.87–5.11)
Retic Count, Absolute: 24.3 10*3/uL (ref 19.0–186.0)
Retic Ct Pct: 0.6 % (ref 0.4–3.1)

## 2021-07-21 LAB — CMP (CANCER CENTER ONLY)
ALT: 8 U/L (ref 0–44)
AST: 19 U/L (ref 15–41)
Albumin: 3.8 g/dL (ref 3.5–5.0)
Alkaline Phosphatase: 70 U/L (ref 38–126)
Anion gap: 8 (ref 5–15)
BUN: 46 mg/dL — ABNORMAL HIGH (ref 8–23)
CO2: 27 mmol/L (ref 22–32)
Calcium: 9 mg/dL (ref 8.9–10.3)
Chloride: 94 mmol/L — ABNORMAL LOW (ref 98–111)
Creatinine: 1.77 mg/dL — ABNORMAL HIGH (ref 0.44–1.00)
GFR, Estimated: 31 mL/min — ABNORMAL LOW (ref >60)
Glucose, Bld: 130 mg/dL — ABNORMAL HIGH (ref 70–99)
Potassium: 4.4 mmol/L (ref 3.5–5.1)
Sodium: 129 mmol/L — ABNORMAL LOW (ref 135–145)
Total Bilirubin: 0.5 mg/dL (ref 0.3–1.2)
Total Protein: 6.2 g/dL — ABNORMAL LOW (ref 6.5–8.1)

## 2021-07-21 LAB — FERRITIN: Ferritin: 55 ng/mL (ref 11–307)

## 2021-07-21 MED ORDER — DARBEPOETIN ALFA 300 MCG/0.6ML IJ SOSY
300.0000 ug | PREFILLED_SYRINGE | Freq: Once | INTRAMUSCULAR | Status: AC
  Administered 2021-07-21: 300 ug via SUBCUTANEOUS
  Filled 2021-07-21: qty 0.6

## 2021-07-21 NOTE — Patient Instructions (Signed)
Darbepoetin Alfa injection ?What is this medication? ?DARBEPOETIN ALFA (dar be POE e tin  AL fa) helps your body make more red blood cells. It is used to treat anemia caused by chronic kidney failure and chemotherapy. ?This medicine may be used for other purposes; ask your health care provider or pharmacist if you have questions. ?COMMON BRAND NAME(S): Aranesp ?What should I tell my care team before I take this medication? ?They need to know if you have any of these conditions: ?blood clotting disorders or history of blood clots ?cancer patient not on chemotherapy ?cystic fibrosis ?heart disease, such as angina, heart failure, or a history of a heart attack ?hemoglobin level of 12 g/dL or greater ?high blood pressure ?low levels of folate, iron, or vitamin B12 ?seizures ?an unusual or allergic reaction to darbepoetin, erythropoietin, albumin, hamster proteins, latex, other medicines, foods, dyes, or preservatives ?pregnant or trying to get pregnant ?breast-feeding ?How should I use this medication? ?This medicine is for injection into a vein or under the skin. It is usually given by a health care professional in a hospital or clinic setting. ?If you get this medicine at home, you will be taught how to prepare and give this medicine. Use exactly as directed. Take your medicine at regular intervals. Do not take your medicine more often than directed. ?It is important that you put your used needles and syringes in a special sharps container. Do not put them in a trash can. If you do not have a sharps container, call your pharmacist or healthcare provider to get one. ?A special MedGuide will be given to you by the pharmacist with each prescription and refill. Be sure to read this information carefully each time. ?Talk to your pediatrician regarding the use of this medicine in children. While this medicine may be used in children as young as 1 month of age for selected conditions, precautions do apply. ?Overdosage: If  you think you have taken too much of this medicine contact a poison control center or emergency room at once. ?NOTE: This medicine is only for you. Do not share this medicine with others. ?What if I miss a dose? ?If you miss a dose, take it as soon as you can. If it is almost time for your next dose, take only that dose. Do not take double or extra doses. ?What may interact with this medication? ?Do not take this medicine with any of the following medications: ?epoetin alfa ?This list may not describe all possible interactions. Give your health care provider a list of all the medicines, herbs, non-prescription drugs, or dietary supplements you use. Also tell them if you smoke, drink alcohol, or use illegal drugs. Some items may interact with your medicine. ?What should I watch for while using this medication? ?Your condition will be monitored carefully while you are receiving this medicine. ?You may need blood work done while you are taking this medicine. ?This medicine may cause a decrease in vitamin B6. You should make sure that you get enough vitamin B6 while you are taking this medicine. Discuss the foods you eat and the vitamins you take with your health care professional. ?What side effects may I notice from receiving this medication? ?Side effects that you should report to your doctor or health care professional as soon as possible: ?allergic reactions like skin rash, itching or hives, swelling of the face, lips, or tongue ?breathing problems ?changes in vision ?chest pain ?confusion, trouble speaking or understanding ?feeling faint or lightheaded, falls ?high blood   pressure ?muscle aches or pains ?pain, swelling, warmth in the leg ?rapid weight gain ?severe headaches ?sudden numbness or weakness of the face, arm or leg ?trouble walking, dizziness, loss of balance or coordination ?seizures (convulsions) ?swelling of the ankles, feet, hands ?unusually weak or tired ?Side effects that usually do not require  medical attention (report to your doctor or health care professional if they continue or are bothersome): ?diarrhea ?fever, chills (flu-like symptoms) ?headaches ?nausea, vomiting ?redness, stinging, or swelling at site where injected ?This list may not describe all possible side effects. Call your doctor for medical advice about side effects. You may report side effects to FDA at 1-800-FDA-1088. ?Where should I keep my medication? ?Keep out of the reach of children and pets. ?Store in a refrigerator. Do not freeze. Do not shake. Throw away any unused portion if using a single-dose vial. Multi-dose vials can be kept in the refrigerator for up to 21 days after the initial dose. Throw away unused medicine. ?To get rid of medications that are no longer needed or have expired: ?Take the medication to a medication take-back program. Check with your pharmacy or law enforcement to find a location. ?If you cannot return the medication, ask your pharmacist or care team how to get rid of the medication safely. ?NOTE: This sheet is a summary. It may not cover all possible information. If you have questions about this medicine, talk to your doctor, pharmacist, or health care provider. ?? 2023 Elsevier/Gold Standard (2021-01-11 00:00:00) ? ?

## 2021-07-21 NOTE — Progress Notes (Signed)
Hematology and Oncology Follow Up Visit  Brittney Pace [id number] Jun 01, 1955 66 y.o. 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  He seems to be doing okay although she does feel tired.  Her feet are little bit more swollen.  The last time that we saw her, she was having problems with hyperkalemia.  We had to get her down to the emergency room.  They took care of the problem down there.  She is still have some problems with her blood sugars.  She thinks that is the insulin pump that is causing the problem.  When we last saw her, her iron studies showed a ferritin of 71 with an iron saturation of 32%.  She has had no fever.  There is no cough.  She has had no nausea or vomiting.  There has been some diarrhea.  I think she has been followed by Gastroenterology.  She has had a little bit of leg swelling about the ankles and feet.  She has had no rashes.  Overall, I would say performance status is probably ECOG 2.      Medications:  Allergies as of 07/21/2021       Reactions   Morphine Anaphylaxis, Shortness Of Breath   Penicillins Hives   Tolerated ANCEF on 04/30/20 Has patient had a PCN reaction causing immediate rash, facial/tongue/throat swelling, SOB or lightheadedness with hypotension: Yes Has patient had a PCN reaction causing severe rash involving mucus membranes or skin necrosis: No Has patient had a PCN reaction that required hospitalization No Has patient had a PCN reaction occurring within the last 10 years: No If all of the above answers are "NO", then may proceed with Cephalosporin use.   Tramadol Anaphylaxis   Acetaminophen Other (See Comments)   Alters insulin pump readings   Amitriptyline Other (See Comments)   Severe headache/ out of  body feeling   Codeine Other (See Comments)   Severe headaches/ out of body feeling   Ibuprofen Other (See Comments)   Messes up CGM reading on glucose monitor   Krill Oil Diarrhea, Itching, Nausea And Vomiting   Losartan Cough   Propoxyphene Other (See Comments)   Severe headaches / out of body feeling   Shellfish Allergy Diarrhea, Nausea And Vomiting   Statins Other (See Comments)   Muscle pains Other reaction(s): Other   Sulfamethoxazole Other (See Comments)   Mouth ulcers   Fluconazole    Other reaction(s): Contact Dermatitis (intolerance)   Norvasc [amlodipine Besylate] Swelling        Medication List        Accurate as of July 21, 2021  2:56 PM. If you have any questions, ask your nurse or doctor.          Accu-Chek Guide test strip Generic drug: glucose blood 3 (three) times daily.   AMBULATORY NON FORMULARY MEDICATION Incontinence pads per patient preference, #unlimited, refill [phone] Fax to [phone]   aspirin EC 325 MG tablet Take 1 tablet (325 mg total) by mouth daily. What changed: when to take this   carvedilol 12.5 MG tablet Commonly known as: COREG Take 1 tablet (12.5 mg total) by mouth 2 (two) times daily with a meal.   cetirizine 10 MG tablet Commonly known as: ZYRTEC  Take 10 mg by mouth daily.   Creon 3000-9500 units Cpep Generic drug: Pancrelipase (Lip-Prot-Amyl) Take 12,000 Units by mouth in the morning, at noon, and at bedtime.   Dexcom G6 Sensor Misc USE TO CONTINUOUSLY MONITOR BLOOD SUGARS. CHANGE EVERY 10 DAYS   diclofenac Sodium 1 % Gel Commonly known as: VOLTAREN Apply 2 g topically daily as needed (Pain).   diphenoxylate-atropine 2.5-0.025 MG tablet Commonly known as: LOMOTIL TAKE 1 TABLET BY MOUTH 4 TIMES DAILY AS NEEDED FOR DIARRHEA OR  LOOSE  STOOLS   esomeprazole 20 MG capsule Commonly known as: NEXIUM Take 20 mg by mouth every morning.   fluocinonide 0.05 % external solution Commonly known as: LIDEX Apply 1  application topically See admin instructions. Once every three days   gabapentin 100 MG capsule Commonly known as: NEURONTIN Take 1-2 capsules (100-200 mg total) by mouth 3 (three) times daily as needed.   insulin pump Soln Inject into the skin continuous. Novolog insulin   lidocaine-prilocaine cream Commonly known as: EMLA Apply 1 application topically every 2 (two) hours as needed. Please run with good Rx discount coupon   lisinopril 10 MG tablet Commonly known as: ZESTRIL Take 10 mg by mouth daily.   modafinil 100 MG tablet Commonly known as: Provigil Take 1 tablet (100 mg total) by mouth daily.   nitroGLYCERIN 0.4 MG/SPRAY spray Commonly known as: NITROLINGUAL Place 1 spray under the tongue every 5 (five) minutes x 3 doses as needed for chest pain.   NovoLOG 100 UNIT/ML injection Generic drug: insulin aspart Inject 40 Units into the skin daily.   OVER THE COUNTER MEDICATION daily. Magnesium L-Treonate   Premarin vaginal cream Generic drug: conjugated estrogens every other day.   spironolactone 25 MG tablet Commonly known as: ALDACTONE Take 50 mg by mouth daily.   thyroid 60 MG tablet Commonly known as: NP Thyroid Take 1 tablet (60 mg total) by mouth daily with breakfast. LAST REFILL. NEEDS TO TRANSFER CARE TO NEW PCP   torsemide 20 MG tablet Commonly known as: [name] TAKE 1 TABLET ONCE DAILY INTHE MORNING. MAY TAKE AN   EXTRA 1/2 TABLET AS        NEEDED   valACYclovir 500 MG tablet Commonly known as: VALTREX Take 500 mg by mouth daily as needed (Flair up).   Vitamin D (Ergocalciferol) 1.25 MG (50000 UNIT) Caps capsule Commonly known as: DRISDOL Take 50,000 Units by mouth once a week.        Allergies:  Allergies  Allergen Reactions   Morphine Anaphylaxis and Shortness Of Breath   Penicillins Hives    Tolerated ANCEF on 04/30/20 Has patient had a PCN reaction causing immediate rash, facial/tongue/throat swelling, SOB or lightheadedness with  hypotension: Yes Has patient had a PCN reaction causing severe rash involving mucus membranes or skin necrosis: No Has patient had a PCN reaction that required hospitalization No Has patient had a PCN reaction occurring within the last 10 years: No If all of the above answers are "NO", then may proceed with Cephalosporin use.   Tramadol Anaphylaxis   Acetaminophen Other (See Comments)    Alters insulin pump readings    Amitriptyline Other (See Comments)    Severe headache/ out of body feeling   Codeine Other (See Comments)    Severe headaches/ out of body feeling    Ibuprofen Other (See Comments)    Messes up CGM reading on glucose monitor     Krill Oil Diarrhea, Itching and Nausea And Vomiting   Losartan  Cough   Propoxyphene Other (See Comments)    Severe headaches / out of body feeling    Shellfish Allergy Diarrhea and Nausea And Vomiting   Statins Other (See Comments)    Muscle pains Other reaction(s): Other   Sulfamethoxazole Other (See Comments)    Mouth ulcers    Fluconazole     Other reaction(s): Contact Dermatitis (intolerance)   Norvasc [Amlodipine Besylate] Swelling    Past Medical History, Surgical history, Social history, and Family History were reviewed and updated.  Review of Systems: Review of Systems  Constitutional:  Positive for malaise/fatigue.  HENT: Negative.    Eyes: Negative.   Respiratory: Negative.    Cardiovascular:  Positive for leg swelling.  Gastrointestinal:  Positive for abdominal pain.  Genitourinary: Negative.   Musculoskeletal:  Positive for joint pain and myalgias.  Skin:  Positive for rash.  Neurological: Negative.   Endo/Heme/Allergies: Negative.   Psychiatric/Behavioral: Negative.       Physical Exam:  weight is 107 lb (48.5 kg). Her oral temperature is 97.9 F (36.6 C). Her blood pressure is 175/74 (abnormal) and her pulse is 62. Her respiration is 18 and oxygen saturation is 100%.   Wt Readings from Last 3 Encounters:   07/21/21 107 lb (48.5 kg)  06/16/21 106 lb (48.1 kg)  06/16/21 106 lb 6.4 oz (48.3 kg)    Physical Exam Vitals reviewed.  HENT:     Head: Normocephalic and atraumatic.  Eyes:     Pupils: Pupils are equal, round, and reactive to light.  Cardiovascular:     Rate and Rhythm: Normal rate and regular rhythm.     Heart sounds: Normal heart sounds.  Pulmonary:     Effort: Pulmonary effort is normal.     Breath sounds: Normal breath sounds.  Abdominal:     General: Bowel sounds are normal.     Palpations: Abdomen is soft.  Musculoskeletal:        General: No tenderness or deformity. Normal range of motion.     Cervical back: Normal range of motion.     Comments: On her extremities, there is some edema in her legs.  She has about 2+ edema.  She has a macular type reddish rash in the lower extremities.  This is in the areas above her ankles.  There is a couple areas that might be a little bit open.  Lymphadenopathy:     Cervical: No cervical adenopathy.  Skin:    General: Skin is warm and dry.     Findings: No erythema or rash.  Neurological:     Mental Status: She is alert and oriented to person, place, and time.  Psychiatric:        Behavior: Behavior normal.        Thought Content: Thought content normal.        Judgment: Judgment normal.     Lab Results  Component Value Date   WBC 7.7 07/21/2021   HGB 10.1 (L) 07/21/2021   HCT 32.3 (L) 07/21/2021   MCV 84.6 07/21/2021   PLT 288 07/21/2021   Lab Results  Component Value Date   FERRITIN 71 06/16/2021   IRON 102 06/16/2021   TIBC 316 06/16/2021   UIBC 214 06/16/2021   IRONPCTSAT 32 (H) 06/16/2021   Lab Results  Component Value Date   RETICCTPCT 0.6 07/21/2021   RBC 3.82 (L) 07/21/2021   RBC 3.79 (L) 07/21/2021   No results found for: "KPAFRELGTCHN", "LAMBDASER", "KAPLAMBRATIO" No results found for: "IGGSERUM", "  IGA", "IGMSERUM" No results found for: "TOTALPROTELP", "ALBUMINELP", "A1GS", "A2GS", "BETS",  "BETA2SER", "GAMS", "MSPIKE", "SPEI"   Chemistry      Component Value Date/Time   NA 137 06/16/2021 1652   NA 138 07/17/2017 1128   K 6.2 (H) 06/16/2021 1652   CL 113 (H) 06/16/2021 1652   CO2 16 (L) 06/16/2021 1652   BUN 40 (H) 06/16/2021 1652   BUN 23 07/17/2017 1128   CREATININE 1.84 (H) 06/16/2021 1652   CREATININE 1.97 (H) 06/16/2021 1312   CREATININE 0.99 07/20/2018 0821      Component Value Date/Time   CALCIUM 8.9 06/16/2021 1652   ALKPHOS 96 06/16/2021 1511   AST 22 06/16/2021 1511   AST 18 06/16/2021 1312   ALT 14 06/16/2021 1511   ALT 10 06/16/2021 1312   BILITOT 0.4 06/16/2021 1511   BILITOT 0.2 (L) 06/16/2021 1312       Impression and Plan: Ms. Dant is a very pleasant 66 yo female with multifactorial anemia.  We will go ahead and give her a dose of Aranesp today.  Again this is keeping her hemoglobin above 10.  I think she sees her nephrologist soon.  She can show him the labs that we have on her today.  Which like to follow her somewhat closely.  I just hate that we have the problems with her blood sugars and with some of the swelling in her feet.  Again I think some of the swelling in the feet could be from her being anemic.  We will plan to get her back to see [place] in another 3 weeks or so.   [name], MD 6/28/20232:56 PM

## 2021-07-22 ENCOUNTER — Inpatient Hospital Stay: Payer: Medicare Other

## 2021-07-22 ENCOUNTER — Inpatient Hospital Stay: Payer: Medicare Other | Admitting: Hematology & Oncology

## 2021-07-22 LAB — IRON AND IRON BINDING CAPACITY (CC-WL,HP ONLY)
Iron: 107 ug/dL (ref 28–170)
Saturation Ratios: 33 % — ABNORMAL HIGH (ref 10.4–31.8)
TIBC: 323 ug/dL (ref 250–450)
UIBC: 216 ug/dL (ref 148–442)

## 2021-07-23 ENCOUNTER — Encounter: Payer: Self-pay | Admitting: *Deleted

## 2021-08-09 ENCOUNTER — Encounter: Payer: Self-pay | Admitting: *Deleted

## 2021-08-09 ENCOUNTER — Inpatient Hospital Stay (HOSPITAL_BASED_OUTPATIENT_CLINIC_OR_DEPARTMENT_OTHER): Payer: Medicare Other | Admitting: Hematology & Oncology

## 2021-08-09 ENCOUNTER — Inpatient Hospital Stay: Payer: Medicare Other | Attending: Hematology & Oncology

## 2021-08-09 ENCOUNTER — Encounter: Payer: Self-pay | Admitting: Hematology & Oncology

## 2021-08-09 ENCOUNTER — Inpatient Hospital Stay: Payer: Medicare Other

## 2021-08-09 ENCOUNTER — Other Ambulatory Visit: Payer: Self-pay

## 2021-08-09 VITALS — BP 178/81 | HR 53 | Temp 97.8°F | Resp 18 | Wt 108.0 lb

## 2021-08-09 DIAGNOSIS — E039 Hypothyroidism, unspecified: Secondary | ICD-10-CM | POA: Diagnosis not present

## 2021-08-09 DIAGNOSIS — Z79899 Other long term (current) drug therapy: Secondary | ICD-10-CM | POA: Insufficient documentation

## 2021-08-09 DIAGNOSIS — E1165 Type 2 diabetes mellitus with hyperglycemia: Secondary | ICD-10-CM | POA: Insufficient documentation

## 2021-08-09 DIAGNOSIS — D460 Refractory anemia without ring sideroblasts, so stated: Secondary | ICD-10-CM | POA: Insufficient documentation

## 2021-08-09 DIAGNOSIS — D500 Iron deficiency anemia secondary to blood loss (chronic): Secondary | ICD-10-CM

## 2021-08-09 DIAGNOSIS — Z794 Long term (current) use of insulin: Secondary | ICD-10-CM | POA: Insufficient documentation

## 2021-08-09 DIAGNOSIS — N1831 Chronic kidney disease, stage 3a: Secondary | ICD-10-CM

## 2021-08-09 DIAGNOSIS — D638 Anemia in other chronic diseases classified elsewhere: Secondary | ICD-10-CM

## 2021-08-09 DIAGNOSIS — D631 Anemia in chronic kidney disease: Secondary | ICD-10-CM

## 2021-08-09 DIAGNOSIS — Z8673 Personal history of transient ischemic attack (TIA), and cerebral infarction without residual deficits: Secondary | ICD-10-CM | POA: Insufficient documentation

## 2021-08-09 DIAGNOSIS — N183 Chronic kidney disease, stage 3 unspecified: Secondary | ICD-10-CM

## 2021-08-09 LAB — IRON AND IRON BINDING CAPACITY (CC-WL,HP ONLY)
Iron: 97 ug/dL (ref 28–170)
Saturation Ratios: 26 % (ref 10.4–31.8)
TIBC: 370 ug/dL (ref 250–450)
UIBC: 273 ug/dL (ref 148–442)

## 2021-08-09 LAB — CBC WITH DIFFERENTIAL (CANCER CENTER ONLY)
Abs Immature Granulocytes: 0.07 10*3/uL (ref 0.00–0.07)
Basophils Absolute: 0.1 10*3/uL (ref 0.0–0.1)
Basophils Relative: 1 %
Eosinophils Absolute: 0.4 10*3/uL (ref 0.0–0.5)
Eosinophils Relative: 7 %
HCT: 33.5 % — ABNORMAL LOW (ref 36.0–46.0)
Hemoglobin: 10.2 g/dL — ABNORMAL LOW (ref 12.0–15.0)
Immature Granulocytes: 1 %
Lymphocytes Relative: 25 %
Lymphs Abs: 1.5 10*3/uL (ref 0.7–4.0)
MCH: 26 pg (ref 26.0–34.0)
MCHC: 30.4 g/dL (ref 30.0–36.0)
MCV: 85.5 fL (ref 80.0–100.0)
Monocytes Absolute: 0.6 10*3/uL (ref 0.1–1.0)
Monocytes Relative: 11 %
Neutro Abs: 3.3 10*3/uL (ref 1.7–7.7)
Neutrophils Relative %: 55 %
Platelet Count: 310 10*3/uL (ref 150–400)
RBC: 3.92 MIL/uL (ref 3.87–5.11)
RDW: 16.1 % — ABNORMAL HIGH (ref 11.5–15.5)
WBC Count: 6.1 10*3/uL (ref 4.0–10.5)
nRBC: 0 % (ref 0.0–0.2)

## 2021-08-09 LAB — CMP (CANCER CENTER ONLY)
ALT: 9 U/L (ref 0–44)
AST: 16 U/L (ref 15–41)
Albumin: 3.9 g/dL (ref 3.5–5.0)
Alkaline Phosphatase: 86 U/L (ref 38–126)
Anion gap: 7 (ref 5–15)
BUN: 38 mg/dL — ABNORMAL HIGH (ref 8–23)
CO2: 24 mmol/L (ref 22–32)
Calcium: 8.8 mg/dL — ABNORMAL LOW (ref 8.9–10.3)
Chloride: 93 mmol/L — ABNORMAL LOW (ref 98–111)
Creatinine: 1.8 mg/dL — ABNORMAL HIGH (ref 0.44–1.00)
GFR, Estimated: 30 mL/min — ABNORMAL LOW (ref >60)
Glucose, Bld: 234 mg/dL — ABNORMAL HIGH (ref 70–99)
Potassium: 4.5 mmol/L (ref 3.5–5.1)
Sodium: 124 mmol/L — ABNORMAL LOW (ref 135–145)
Total Bilirubin: 0.6 mg/dL (ref 0.3–1.2)
Total Protein: 6.2 g/dL — ABNORMAL LOW (ref 6.5–8.1)

## 2021-08-09 LAB — TSH: TSH: 0.594 u[IU]/mL (ref 0.350–4.500)

## 2021-08-09 LAB — RETICULOCYTES
Immature Retic Fract: 10.8 % (ref 2.3–15.9)
RBC.: 3.94 MIL/uL (ref 3.87–5.11)
Retic Count, Absolute: 66.2 10*3/uL (ref 19.0–186.0)
Retic Ct Pct: 1.7 % (ref 0.4–3.1)

## 2021-08-09 LAB — FERRITIN: Ferritin: 16 ng/mL (ref 11–307)

## 2021-08-09 LAB — VITAMIN B12: Vitamin B-12: 247 pg/mL (ref 180–914)

## 2021-08-09 MED ORDER — DARBEPOETIN ALFA 300 MCG/0.6ML IJ SOSY
300.0000 ug | PREFILLED_SYRINGE | Freq: Once | INTRAMUSCULAR | Status: AC
  Administered 2021-08-09: 300 ug via SUBCUTANEOUS
  Filled 2021-08-09: qty 0.6

## 2021-08-09 NOTE — Progress Notes (Signed)
Hematology and Oncology Follow Up Visit  Brittney Pace [id number] Apr 03, 1955 66 y.o. 08/09/2021   Principle Diagnosis:  Anemia of erythropoietin deficiency - chronic kidney disease Insulin-dependent diabetes History of TIAs   Current Therapy:        Aranesp 300 mcg SQ to maintain Hgb > 11    Interim History:  Brittney Pace is here today for follow-up.  She is not doing all that well.  She just feels tired.  She has no energy.  She has gained weight.  Somehow, she thinks she has lymphoma.  I am not sure why she was said that.  I think because a relative had a lymphoma and felt the same way.  She has not had her thyroid or B12 checked in 3 years.  Monitor is managing her hypothyroidism.  Maybe they were checking her TSH but it just is not in the [facility] system.  She has seen her nephrologist.  I think he may have adjusted her medications a little bit.  She is supposed to have an MRI of the brain.  I think this might be to see if there is any issues with respect to the arteries.  She still has problems with diabetes.  I still think this can be her biggest issue in the long run.  Her blood sugar today is 234.  She does not have any issues with iron.  We will check her iron studies all the time.  She has had no rashes.  There is been no cough.  She has had no nausea or vomiting.  Overall, I would say performance status is probably ECOG 2.       Medications:  Allergies as of 08/09/2021       Reactions   Morphine Anaphylaxis, Shortness Of Breath   Penicillins Hives   Tolerated ANCEF on 04/30/20 Has patient had a PCN reaction causing immediate rash, facial/tongue/throat swelling, SOB or lightheadedness with hypotension: Yes Has patient had a PCN reaction causing severe rash involving mucus membranes or skin necrosis: No Has patient had a PCN reaction that required hospitalization No Has patient had a PCN reaction occurring within the last 10 years: No If all of the above answers are  "NO", then may proceed with Cephalosporin use.   Tramadol Anaphylaxis   Acetaminophen Other (See Comments)   Alters insulin pump readings   Amitriptyline Other (See Comments)   Severe headache/ out of body feeling   Codeine Other (See Comments)   Severe headaches/ out of body feeling   Ibuprofen Other (See Comments)   Messes up CGM reading on glucose monitor   Krill Oil Diarrhea, Itching, Nausea And Vomiting   Losartan Cough   Propoxyphene Other (See Comments)   Severe headaches / out of body feeling   Shellfish Allergy Diarrhea, Nausea And Vomiting   Statins Other (See Comments)   Muscle pains Other reaction(s): Other   Sulfamethoxazole Other (See Comments)   Mouth ulcers   Fluconazole    Other reaction(s): Contact Dermatitis (intolerance)   Norvasc [amlodipine Besylate] Swelling        Medication List        Accurate as of August 09, 2021  1:36 PM. If you have any questions, ask your nurse or doctor.          STOP taking these medications    fluocinonide 0.05 % external solution Commonly known as: LIDEX Stopped by: [name], MD   modafinil 100 MG tablet Commonly known as: Provigil Stopped by: Collier Salina  Oletha Cruel, MD   spironolactone 25 MG tablet Commonly known as: ALDACTONE Stopped by: [name], MD   Vitamin D (Ergocalciferol) 1.25 MG (50000 UNIT) Caps capsule Commonly known as: DRISDOL Stopped by: [name], MD       TAKE these medications    Accu-Chek Guide test strip Generic drug: glucose blood 3 (three) times daily.   AMBULATORY NON FORMULARY MEDICATION Incontinence pads per patient preference, #unlimited, refill x99 Fax to [phone]   Armour Thyroid 60 MG tablet Generic drug: thyroid Take 1 tablet by mouth daily. What changed: Another medication with the same name was removed. Continue taking this medication, and follow the directions you see here. Changed by: [name], MD   aspirin EC 325 MG tablet Take 1  tablet (325 mg total) by mouth daily. What changed: when to take this   carvedilol 12.5 MG tablet Commonly known as: COREG Take 1 tablet (12.5 mg total) by mouth 2 (two) times daily with a meal.   cetirizine 10 MG tablet Commonly known as: ZYRTEC Take 10 mg by mouth daily.   Creon 3000-9500 units Cpep Generic drug: Pancrelipase (Lip-Prot-Amyl) Take 12,000 Units by mouth in the morning, at noon, and at bedtime.   Dexcom G6 Sensor Misc USE TO CONTINUOUSLY MONITOR BLOOD SUGARS. CHANGE EVERY 10 DAYS   diclofenac Sodium 1 % Gel Commonly known as: VOLTAREN Apply 2 g topically daily as needed (Pain).   diphenoxylate-atropine 2.5-0.025 MG tablet Commonly known as: LOMOTIL TAKE 1 TABLET BY MOUTH 4 TIMES DAILY AS NEEDED FOR DIARRHEA OR  LOOSE  STOOLS   esomeprazole 20 MG capsule Commonly known as: NEXIUM Take 20 mg by mouth every morning.   gabapentin 300 MG capsule Commonly known as: NEURONTIN Take 300 mg by mouth as needed. What changed: Another medication with the same name was removed. Continue taking this medication, and follow the directions you see here. Changed by: [name], MD   insulin pump Soln Inject into the skin continuous. Novolog insulin   lidocaine-prilocaine cream Commonly known as: EMLA Apply 1 application topically every 2 (two) hours as needed. Please run with good Rx discount coupon   lisinopril 10 MG tablet Commonly known as: ZESTRIL Take 10 mg by mouth daily.   nitroGLYCERIN 0.4 MG/SPRAY spray Commonly known as: NITROLINGUAL Place 1 spray under the tongue every 5 (five) minutes x 3 doses as needed for chest pain.   NovoLOG 100 UNIT/ML injection Generic drug: insulin aspart Inject 40 Units into the skin daily.   OVER THE COUNTER MEDICATION daily. Magnesium L-Treonate   Premarin vaginal cream Generic drug: conjugated estrogens every other day.   torsemide 20 MG tablet Commonly known as: [name] TAKE 1 TABLET ONCE DAILY INTHE MORNING.  MAY TAKE AN   EXTRA 1/2 TABLET AS        NEEDED   valACYclovir 500 MG tablet Commonly known as: VALTREX Take 500 mg by mouth daily as needed (Flair up).        Allergies:  Allergies  Allergen Reactions   Morphine Anaphylaxis and Shortness Of Breath   Penicillins Hives    Tolerated ANCEF on 04/30/20 Has patient had a PCN reaction causing immediate rash, facial/tongue/throat swelling, SOB or lightheadedness with hypotension: Yes Has patient had a PCN reaction causing severe rash involving mucus membranes or skin necrosis: No Has patient had a PCN reaction that required hospitalization No Has patient had a PCN reaction occurring within the last 10 years: No If all of the  above answers are "NO", then may proceed with Cephalosporin use.   Tramadol Anaphylaxis   Acetaminophen Other (See Comments)    Alters insulin pump readings    Amitriptyline Other (See Comments)    Severe headache/ out of body feeling   Codeine Other (See Comments)    Severe headaches/ out of body feeling    Ibuprofen Other (See Comments)    Messes up CGM reading on glucose monitor     Krill Oil Diarrhea, Itching and Nausea And Vomiting   Losartan Cough   Propoxyphene Other (See Comments)    Severe headaches / out of body feeling    Shellfish Allergy Diarrhea and Nausea And Vomiting   Statins Other (See Comments)    Muscle pains Other reaction(s): Other   Sulfamethoxazole Other (See Comments)    Mouth ulcers    Fluconazole     Other reaction(s): Contact Dermatitis (intolerance)   Norvasc [Amlodipine Besylate] Swelling    Past Medical History, Surgical history, Social history, and Family History were reviewed and updated.  Review of Systems: Review of Systems  Constitutional:  Positive for malaise/fatigue.  HENT: Negative.    Eyes: Negative.   Respiratory: Negative.    Cardiovascular:  Positive for leg swelling.  Gastrointestinal:  Positive for abdominal pain.  Genitourinary: Negative.    Musculoskeletal:  Positive for joint pain and myalgias.  Skin:  Positive for rash.  Neurological: Negative.   Endo/Heme/Allergies: Negative.   Psychiatric/Behavioral: Negative.       Physical Exam:  weight is 108 lb (49 kg). Her oral temperature is 97.8 F (36.6 C). Her blood pressure is 178/81 (abnormal) and her pulse is 53 (abnormal). Her respiration is 18 and oxygen saturation is 100%.   Wt Readings from Last 3 Encounters:  08/09/21 108 lb (49 kg)  07/21/21 107 lb (48.5 kg)  06/16/21 106 lb (48.1 kg)    Physical Exam Vitals reviewed.  HENT:     Head: Normocephalic and atraumatic.  Eyes:     Pupils: Pupils are equal, round, and reactive to light.  Cardiovascular:     Rate and Rhythm: Normal rate and regular rhythm.     Heart sounds: Normal heart sounds.  Pulmonary:     Effort: Pulmonary effort is normal.     Breath sounds: Normal breath sounds.  Abdominal:     General: Bowel sounds are normal.     Palpations: Abdomen is soft.  Musculoskeletal:        General: No tenderness or deformity. Normal range of motion.     Cervical back: Normal range of motion.     Comments: On her extremities, there is some edema in her legs.  She has about 2+ edema.  She has a macular type reddish rash in the lower extremities.  This is in the areas above her ankles.  There is a couple areas that might be a little bit open.  Lymphadenopathy:     Cervical: No cervical adenopathy.  Skin:    General: Skin is warm and dry.     Findings: No erythema or rash.  Neurological:     Mental Status: She is alert and oriented to person, place, and time.  Psychiatric:        Behavior: Behavior normal.        Thought Content: Thought content normal.        Judgment: Judgment normal.     Lab Results  Component Value Date   WBC 6.1 08/09/2021   HGB 10.2 (L) 08/09/2021  HCT 33.5 (L) 08/09/2021   MCV 85.5 08/09/2021   PLT 310 08/09/2021   Lab Results  Component Value Date   FERRITIN 55  07/21/2021   IRON 107 07/21/2021   TIBC 323 07/21/2021   UIBC 216 07/21/2021   IRONPCTSAT 33 (H) 07/21/2021   Lab Results  Component Value Date   RETICCTPCT 1.7 08/09/2021   RBC 3.92 08/09/2021   RBC 3.94 08/09/2021   No results found for: "KPAFRELGTCHN", "LAMBDASER", "KAPLAMBRATIO" No results found for: "IGGSERUM", "IGA", "IGMSERUM" No results found for: "TOTALPROTELP", "ALBUMINELP", "A1GS", "A2GS", "BETS", "BETA2SER", "GAMS", "MSPIKE", "SPEI"   Chemistry      Component Value Date/Time   NA 124 (L) 08/09/2021 1143   NA 138 07/17/2017 1128   K 4.5 08/09/2021 1143   CL 93 (L) 08/09/2021 1143   CO2 24 08/09/2021 1143   BUN 38 (H) 08/09/2021 1143   BUN 23 07/17/2017 1128   CREATININE 1.80 (H) 08/09/2021 1143   CREATININE 0.99 07/20/2018 0821      Component Value Date/Time   CALCIUM 8.8 (L) 08/09/2021 1143   ALKPHOS 86 08/09/2021 1143   AST 16 08/09/2021 1143   ALT 9 08/09/2021 1143   BILITOT 0.6 08/09/2021 1143       Impression and Plan: Ms. Lampron is a very pleasant 66 yo female with multifactorial anemia.  We will go ahead and give her a dose of Aranesp today.  I realize that her blood pressure is a little on the higher side.  I still think we should be able to go ahead and give the Aranesp.    I we will check her TSH and B12 to see if these are okay.  I just do not think we have to do scans on her.  I think scans would probably not be all that helpful given the fact that we cannot use IV contrast.  Hopefully, she will start feeling better.  Maybe, it would just be a "quick fix" if her thyroid is off or she has low B12.  I will plan to get her back to see [place] in another 4 to 5 weeks.    [name], MD 7/17/20231:36 PM

## 2021-08-09 NOTE — Patient Instructions (Signed)
Darbepoetin Alfa injection ?What is this medication? ?DARBEPOETIN ALFA (dar be POE e tin  AL fa) helps your body make more red blood cells. It is used to treat anemia caused by chronic kidney failure and chemotherapy. ?This medicine may be used for other purposes; ask your health care provider or pharmacist if you have questions. ?COMMON BRAND NAME(S): Aranesp ?What should I tell my care team before I take this medication? ?They need to know if you have any of these conditions: ?blood clotting disorders or history of blood clots ?cancer patient not on chemotherapy ?cystic fibrosis ?heart disease, such as angina, heart failure, or a history of a heart attack ?hemoglobin level of 12 g/dL or greater ?high blood pressure ?low levels of folate, iron, or vitamin B12 ?seizures ?an unusual or allergic reaction to darbepoetin, erythropoietin, albumin, hamster proteins, latex, other medicines, foods, dyes, or preservatives ?pregnant or trying to get pregnant ?breast-feeding ?How should I use this medication? ?This medicine is for injection into a vein or under the skin. It is usually given by a health care professional in a hospital or clinic setting. ?If you get this medicine at home, you will be taught how to prepare and give this medicine. Use exactly as directed. Take your medicine at regular intervals. Do not take your medicine more often than directed. ?It is important that you put your used needles and syringes in a special sharps container. Do not put them in a trash can. If you do not have a sharps container, call your pharmacist or healthcare provider to get one. ?A special MedGuide will be given to you by the pharmacist with each prescription and refill. Be sure to read this information carefully each time. ?Talk to your pediatrician regarding the use of this medicine in children. While this medicine may be used in children as young as 1 month of age for selected conditions, precautions do apply. ?Overdosage: If  you think you have taken too much of this medicine contact a poison control center or emergency room at once. ?NOTE: This medicine is only for you. Do not share this medicine with others. ?What if I miss a dose? ?If you miss a dose, take it as soon as you can. If it is almost time for your next dose, take only that dose. Do not take double or extra doses. ?What may interact with this medication? ?Do not take this medicine with any of the following medications: ?epoetin alfa ?This list may not describe all possible interactions. Give your health care provider a list of all the medicines, herbs, non-prescription drugs, or dietary supplements you use. Also tell them if you smoke, drink alcohol, or use illegal drugs. Some items may interact with your medicine. ?What should I watch for while using this medication? ?Your condition will be monitored carefully while you are receiving this medicine. ?You may need blood work done while you are taking this medicine. ?This medicine may cause a decrease in vitamin B6. You should make sure that you get enough vitamin B6 while you are taking this medicine. Discuss the foods you eat and the vitamins you take with your health care professional. ?What side effects may I notice from receiving this medication? ?Side effects that you should report to your doctor or health care professional as soon as possible: ?allergic reactions like skin rash, itching or hives, swelling of the face, lips, or tongue ?breathing problems ?changes in vision ?chest pain ?confusion, trouble speaking or understanding ?feeling faint or lightheaded, falls ?high blood   pressure ?muscle aches or pains ?pain, swelling, warmth in the leg ?rapid weight gain ?severe headaches ?sudden numbness or weakness of the face, arm or leg ?trouble walking, dizziness, loss of balance or coordination ?seizures (convulsions) ?swelling of the ankles, feet, hands ?unusually weak or tired ?Side effects that usually do not require  medical attention (report to your doctor or health care professional if they continue or are bothersome): ?diarrhea ?fever, chills (flu-like symptoms) ?headaches ?nausea, vomiting ?redness, stinging, or swelling at site where injected ?This list may not describe all possible side effects. Call your doctor for medical advice about side effects. You may report side effects to FDA at 1-800-FDA-1088. ?Where should I keep my medication? ?Keep out of the reach of children and pets. ?Store in a refrigerator. Do not freeze. Do not shake. Throw away any unused portion if using a single-dose vial. Multi-dose vials can be kept in the refrigerator for up to 21 days after the initial dose. Throw away unused medicine. ?To get rid of medications that are no longer needed or have expired: ?Take the medication to a medication take-back program. Check with your pharmacy or law enforcement to find a location. ?If you cannot return the medication, ask your pharmacist or care team how to get rid of the medication safely. ?NOTE: This sheet is a summary. It may not cover all possible information. If you have questions about this medicine, talk to your doctor, pharmacist, or health care provider. ?? 2023 Elsevier/Gold Standard (2021-01-11 00:00:00) ? ?

## 2021-08-10 ENCOUNTER — Encounter: Payer: Self-pay | Admitting: *Deleted

## 2021-08-24 ENCOUNTER — Encounter: Payer: Self-pay | Admitting: Hematology & Oncology

## 2021-08-24 ENCOUNTER — Encounter: Payer: Self-pay | Admitting: Cardiology

## 2021-09-06 DIAGNOSIS — F5101 Primary insomnia: Secondary | ICD-10-CM | POA: Insufficient documentation

## 2021-09-08 ENCOUNTER — Ambulatory Visit (HOSPITAL_BASED_OUTPATIENT_CLINIC_OR_DEPARTMENT_OTHER): Payer: Medicare Other | Admitting: Family

## 2021-09-13 ENCOUNTER — Encounter: Payer: Self-pay | Admitting: Family

## 2021-09-13 NOTE — Progress Notes (Unsigned)
[facility] [place]  ASSESSMENT / PLAN: Brittney Pace is a 66 y.o. female with atherosclerosis of native arteries of bilateral lower extremities causing intermittent claudication.  Patient counseled patients with asymptomatic peripheral arterial disease or claudication have a 1-2% risk of developing chronic limb threatening ischemia, but a 15-30% risk of mortality in the next 5 years. Intervention should only be considered for medically optimized patients with disabling symptoms.   Recommend the following which can slow the progression of atherosclerosis and reduce the risk of major adverse cardiac / limb events:  Complete cessation from all tobacco products. Blood glucose control with goal A1c < 7%. Blood pressure control with goal blood pressure < 140/90 mmHg. Lipid reduction therapy with goal LDL-C <100 mg/dL (<70 if symptomatic from PAD).  Aspirin '81mg'$  PO QD.  Atorvastatin 40-'80mg'$  PO QD (or other "high intensity" statin therapy).  Follow up with me in 1 year with ABI.  CHIEF COMPLAINT: leg pain with walking  HISTORY OF PRESENT ILLNESS: Brittney Pace is a 66 y.o. female With long history of type 1 diabetes mellitus who presents to the clinic for symptoms concerning for intermittent claudication.  She reports a fairly classic history of claudication with cramping discomfort in her calves and feet which occur with activity and stop with rest.  She has no symptoms typical of intermittent claudication or ischemic rest pain.  Past Medical History:  Diagnosis Date   Anemia    Arthritis    Babesiasis    secondary due to lyme disease   CHF (congestive heart failure) (HCC)    Chronic kidney disease    stage 3   Coronary artery disease    Depression    Diabetes mellitus without complication (HCC)    Type 1   Diabetic retinopathy ([place])    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   Family history of adverse reaction to anesthesia    mother: " while she was  under she stopped breathing."   Fibromyalgia    Gastroparesis    GERD (gastroesophageal reflux disease)    Headache    migraines   Hypothyroidism    IBS (irritable bowel syndrome)    Idiopathic edema    Iron deficiency anemia 09/04/2019   Lyme disease    Mitral valve prolapse    Myocardial infarction ([place])    1 major in 1999 and 2 minor " small vessel disease."   Osteoporosis    Peripheral neuropathy    Peripheral vascular disease (HCC)    Sinus disorder    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   Stroke ([place])    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" " the second stroke was a lacunar     Past Surgical History:  Procedure Laterality Date   ABDOMINAL HYSTERECTOMY     APPENDECTOMY     BREAST SURGERY     B/L biopsy and lumpectomy    CARDIAC CATHETERIZATION     CARPAL TUNNEL RELEASE     CATARACT EXTRACTION W/ INTRAOCULAR LENS IMPLANT     right eye   COLONOSCOPY W/ BIOPSIES AND POLYPECTOMY     CORONARY ARTERY BYPASS GRAFT     coronary artery stents     at LAD and LIMA   ECTOPIC PREGNANCY SURGERY     NASAL SEPTUM SURGERY     OPEN REDUCTION INTERNAL FIXATION (ORIF) DISTAL RADIAL FRACTURE Right 05/06/2015   Procedure: OPEN REDUCTION INTERNAL FIXATION (ORIF) RIGHT DISTAL RADIAL FRACTURE AND REPAIRS AS NEEDED;  Surgeon: Iran Planas, MD;  Location: [facility];  Service: Orthopedics;  Laterality: Right;   ORIF HUMERUS FRACTURE Right 04/30/2020   Procedure: OPEN REDUCTION INTERNAL FIXATION (ORIF) PROXIMAL HUMERUS FRACTURE;  Surgeon: Justice Britain, MD;  Location: [facility];  Service: Orthopedics;  Laterality: Right;  134mn   ORIF WRIST FRACTURE Left 11/05/2014   ORIF WRIST FRACTURE Left 11/05/2014   Procedure: OPEN REDUCTION INTERNAL FIXATION (ORIF) LEFT WRIST FRACTURE AND REPAIR AS INDICATED;  Surgeon: FIran Planas MD;  [place] [facility]  Service: Orthopedics;  Laterality: Left;   TRIGGER FINGER RELEASE      Family History  Problem Relation Age of Onset   Breast cancer Mother    Migraines  Mother    Heart disease Father    Hypertension Father    Breast cancer Sister     Social History   Socioeconomic History   Marital status: Married    Spouse name: Not on file   Number of children: 0   Years of education: college   Highest education level: Not on file  Occupational History   Occupation: Retired  Tobacco Use   Smoking status: Former    Packs/day: 1.00    Years: 10.00    Total pack years: 10.00    Types: Cigarettes    Quit date: 1985    Years since quitting: 38.6   Smokeless tobacco: Never   Tobacco comments:     " Quit smoking cigarettes in 20's "  Vaping Use   Vaping Use: Never used  Substance and Sexual Activity   Alcohol use: Yes    Comment: occasional beer or wine   Drug use: No   Sexual activity: Not on file  Other Topics Concern   Not on file  Social History Narrative   Drinks 1-2  cups caffeine drinks a day    Right handed   Lives at home with husband and dog   Social Determinants of Health   Financial Resource Strain: Not on file  Food Insecurity: Not on file  Transportation Needs: Not on file  Physical Activity: Not on file  Stress: Not on file  Social Connections: Not on file  Intimate Partner Violence: Not on file    Allergies  Allergen Reactions   Morphine Anaphylaxis and Shortness Of Breath   Penicillins Hives    Tolerated ANCEF on 04/30/20 Has patient had a PCN reaction causing immediate rash, facial/tongue/throat swelling, SOB or lightheadedness with hypotension: Yes Has patient had a PCN reaction causing severe rash involving mucus membranes or skin necrosis: No Has patient had a PCN reaction that required hospitalization No Has patient had a PCN reaction occurring within the last 10 years: No If all of the above answers are "NO", then may proceed with Cephalosporin use.   Tramadol Anaphylaxis   Acetaminophen Other (See Comments)    Alters insulin pump readings    Amitriptyline Other (See Comments)    Severe headache/ out  of body feeling   Codeine Other (See Comments)    Severe headaches/ out of body feeling    Ibuprofen Other (See Comments)    Messes up CGM reading on glucose monitor     Krill Oil Diarrhea, Itching and Nausea And Vomiting   Losartan Cough   Propoxyphene Other (See Comments)    Severe headaches / out of body feeling    Shellfish Allergy Diarrhea and Nausea And Vomiting   Statins Other (See Comments)    Muscle pains Other reaction(s): Other   Sulfamethoxazole Other (See Comments)  Mouth ulcers    Fluconazole     Other reaction(s): Contact Dermatitis (intolerance)   Norvasc [Amlodipine Besylate] Swelling    Current Outpatient Medications  Medication Sig Dispense Refill   ACCU-CHEK GUIDE test strip 3 (three) times daily.     AMBULATORY NON FORMULARY MEDICATION Incontinence pads per patient preference, #unlimited, refill x99 Fax to [phone] 1 Units prn   aspirin EC 325 MG tablet Take 1 tablet (325 mg total) by mouth daily. (Patient taking differently: Take 325 mg by mouth at bedtime.) 30 tablet 0   carvedilol (COREG) 12.5 MG tablet Take 1 tablet (12.5 mg total) by mouth 2 (two) times daily with a meal. 180 tablet 3   cetirizine (ZYRTEC) 10 MG tablet Take 10 mg by mouth daily.     Continuous Blood Gluc Sensor (DEXCOM G6 SENSOR) MISC USE TO CONTINUOUSLY MONITOR BLOOD SUGARS. CHANGE EVERY 10 DAYS     diclofenac Sodium (VOLTAREN) 1 % GEL Apply 2 g topically daily as needed (Pain). (Patient not taking: Reported on 06/16/2021)     diphenoxylate-atropine (LOMOTIL) 2.5-0.025 MG tablet TAKE 1 TABLET BY MOUTH 4 TIMES DAILY AS NEEDED FOR DIARRHEA OR  LOOSE  STOOLS 45 tablet 0   esomeprazole (NEXIUM) 20 MG capsule Take 20 mg by mouth every morning.      gabapentin (NEURONTIN) 300 MG capsule Take 300 mg by mouth as needed.     Insulin Human (INSULIN PUMP) SOLN Inject into the skin continuous. Novolog insulin     lidocaine-prilocaine (EMLA) cream Apply 1 application topically every 2 (two)  hours as needed. Please run with good Rx discount coupon (Patient not taking: Reported on 06/16/2021) 30 g 0   lisinopril (ZESTRIL) 10 MG tablet Take 10 mg by mouth daily.     nitroGLYCERIN (NITROLINGUAL) 0.4 MG/SPRAY spray Place 1 spray under the tongue every 5 (five) minutes x 3 doses as needed for chest pain. (Patient not taking: Reported on 03/18/2021) 12 g 1   NOVOLOG 100 UNIT/ML injection Inject 40 Units into the skin daily. (Patient not taking: Reported on 06/16/2021)     OVER THE COUNTER MEDICATION daily. Magnesium L-Treonate     Pancrelipase, Lip-Prot-Amyl, (CREON) 3000-9500 units CPEP Take 12,000 Units by mouth in the morning, at noon, and at bedtime.     PREMARIN vaginal cream every other day.     thyroid (ARMOUR THYROID) 60 MG tablet Take 1 tablet by mouth daily.     torsemide ([name]) 20 MG tablet TAKE 1 TABLET ONCE DAILY INTHE MORNING. MAY TAKE AN   EXTRA 1/2 TABLET AS        NEEDED (Patient not taking: Reported on 06/16/2021) 135 tablet 0   valACYclovir (VALTREX) 500 MG tablet Take 500 mg by mouth daily as needed (Flair up). (Patient not taking: Reported on 06/16/2021)     No current facility-administered medications for this visit.    PHYSICAL EXAM Vitals:   09/14/21 1337  BP: (!) 154/65  Pulse: 78  Resp: 20  Temp: 98 F (36.7 C)  SpO2: 95%  Weight: 103 lb (46.7 kg)  Height: '4\' 10"'$  (1.473 m)   Well-appearing woman in no acute distress Regular rate and rhythm Unlabored breathing No palpable pedal pulses    PERTINENT LABORATORY AND RADIOLOGIC DATA  Most recent CBC    Latest Ref Rng & Units 08/09/2021   11:43 AM 07/21/2021    2:31 PM 06/16/2021    3:11 PM  CBC  WBC 4.0 - 10.5 K/uL 6.1  7.7  7.3  Hemoglobin 12.0 - 15.0 g/dL 10.2  10.1  10.8   Hematocrit 36.0 - 46.0 % 33.5  32.3  34.1   Platelets 150 - 400 K/uL 310  288  290      Most recent CMP    Latest Ref Rng & Units 08/09/2021   11:43 AM 07/21/2021    2:31 PM 06/16/2021    4:52 PM  CMP  Glucose 70 - 99  mg/dL 234  130  124   BUN 8 - 23 mg/dL 38  46  40   Creatinine 0.44 - 1.00 mg/dL 1.80  1.77  1.84   Sodium 135 - 145 mmol/L 124  129  137   Potassium 3.5 - 5.1 mmol/L 4.5  4.4  6.2   Chloride 98 - 111 mmol/L 93  94  113   CO2 22 - 32 mmol/L '24  27  16   '$ Calcium 8.9 - 10.3 mg/dL 8.8  9.0  8.9   Total Protein 6.5 - 8.1 g/dL 6.2  6.2    Total Bilirubin 0.3 - 1.2 mg/dL 0.6  0.5    Alkaline Phos 38 - 126 U/L 86  70    AST 15 - 41 U/L 16  19    ALT 0 - 44 U/L 9  8      Renal function CrCl cannot be calculated (Patient's most recent lab result is older than the maximum 21 days allowed.).  Hemoglobin A1C (no units)  Date Value  05/20/2019 7.8%   Hgb A1c MFr Bld (%)  Date Value  12/24/2020 9.6 (H)    LDL Cholesterol (Calc)  Date Value Ref Range Status  07/20/2018 116 (H) mg/dL (calc) Final    Comment:    Reference range: <100 . Desirable range <100 mg/dL for primary prevention;   <70 mg/dL for patients with CHD or diabetic patients  with > or = 2 CHD risk factors. Marland Kitchen LDL-C is now calculated using the Martin-Hopkins  calculation, which is a validated novel method providing  better accuracy than the Friedewald equation in the  estimation of LDL-C.  Cresenciano Genre et al. Annamaria Helling. 3500;938(18): 2061-2068  (http://education.[facility].com/faq/FAQ164)     Outside imaging personally reviewed. Waveform analysis at the ankles shows moderate arterial disease bilaterally Toe pressures reassuring, both greater than 60.  Yevonne Aline. Stanford Breed, MD [facility] Phone Number: [phone] 09/13/2021 5:37 PM  Total time spent on preparing this encounter including chart review, data review, collecting history, examining the patient, coordinating care for this new patient, 60 minutes.  Portions of this report may have been transcribed using voice recognition software.  Every effort has been made to ensure accuracy; however, inadvertent computerized  transcription errors may still be present.

## 2021-09-14 ENCOUNTER — Encounter: Payer: Self-pay | Admitting: Vascular Surgery

## 2021-09-14 ENCOUNTER — Ambulatory Visit (INDEPENDENT_AMBULATORY_CARE_PROVIDER_SITE_OTHER): Payer: Medicare Other | Admitting: Vascular Surgery

## 2021-09-14 VITALS — BP 154/65 | HR 78 | Temp 98.0°F | Resp 20 | Ht <= 58 in | Wt 103.0 lb

## 2021-09-14 DIAGNOSIS — I739 Peripheral vascular disease, unspecified: Secondary | ICD-10-CM | POA: Diagnosis not present

## 2021-09-20 ENCOUNTER — Encounter: Payer: Self-pay | Admitting: Hematology & Oncology

## 2021-09-20 ENCOUNTER — Inpatient Hospital Stay: Payer: Medicare Other | Attending: Hematology & Oncology

## 2021-09-20 ENCOUNTER — Inpatient Hospital Stay: Payer: Medicare Other

## 2021-09-20 ENCOUNTER — Other Ambulatory Visit: Payer: Self-pay

## 2021-09-20 ENCOUNTER — Inpatient Hospital Stay (HOSPITAL_BASED_OUTPATIENT_CLINIC_OR_DEPARTMENT_OTHER): Payer: Medicare Other | Admitting: Hematology & Oncology

## 2021-09-20 VITALS — BP 163/71 | HR 80 | Temp 97.8°F | Resp 19 | Ht <= 58 in | Wt 103.1 lb

## 2021-09-20 DIAGNOSIS — R718 Other abnormality of red blood cells: Secondary | ICD-10-CM

## 2021-09-20 DIAGNOSIS — D460 Refractory anemia without ring sideroblasts, so stated: Secondary | ICD-10-CM | POA: Diagnosis present

## 2021-09-20 DIAGNOSIS — D631 Anemia in chronic kidney disease: Secondary | ICD-10-CM

## 2021-09-20 DIAGNOSIS — D638 Anemia in other chronic diseases classified elsewhere: Secondary | ICD-10-CM

## 2021-09-20 DIAGNOSIS — N183 Type 1 diabetes mellitus with diabetic chronic kidney disease: Secondary | ICD-10-CM

## 2021-09-20 DIAGNOSIS — N1831 Chronic kidney disease, stage 3a: Secondary | ICD-10-CM

## 2021-09-20 DIAGNOSIS — Z79899 Other long term (current) drug therapy: Secondary | ICD-10-CM | POA: Diagnosis not present

## 2021-09-20 DIAGNOSIS — R590 Localized enlarged lymph nodes: Secondary | ICD-10-CM

## 2021-09-20 DIAGNOSIS — E039 Hypothyroidism, unspecified: Secondary | ICD-10-CM

## 2021-09-20 LAB — CBC WITH DIFFERENTIAL (CANCER CENTER ONLY)
Abs Immature Granulocytes: 0.04 10*3/uL (ref 0.00–0.07)
Basophils Absolute: 0.1 10*3/uL (ref 0.0–0.1)
Basophils Relative: 1 %
Eosinophils Absolute: 0.5 10*3/uL (ref 0.0–0.5)
Eosinophils Relative: 6 %
HCT: 32.8 % — ABNORMAL LOW (ref 36.0–46.0)
Hemoglobin: 10.6 g/dL — ABNORMAL LOW (ref 12.0–15.0)
Immature Granulocytes: 1 %
Lymphocytes Relative: 19 %
Lymphs Abs: 1.5 10*3/uL (ref 0.7–4.0)
MCH: 26 pg (ref 26.0–34.0)
MCHC: 32.3 g/dL (ref 30.0–36.0)
MCV: 80.4 fL (ref 80.0–100.0)
Monocytes Absolute: 0.6 10*3/uL (ref 0.1–1.0)
Monocytes Relative: 8 %
Neutro Abs: 5 10*3/uL (ref 1.7–7.7)
Neutrophils Relative %: 65 %
Platelet Count: 285 10*3/uL (ref 150–400)
RBC: 4.08 MIL/uL (ref 3.87–5.11)
RDW: 16 % — ABNORMAL HIGH (ref 11.5–15.5)
WBC Count: 7.7 10*3/uL (ref 4.0–10.5)
nRBC: 0 % (ref 0.0–0.2)

## 2021-09-20 LAB — CMP (CANCER CENTER ONLY)
ALT: 12 U/L (ref 0–44)
AST: 22 U/L (ref 15–41)
Albumin: 3.8 g/dL (ref 3.5–5.0)
Alkaline Phosphatase: 75 U/L (ref 38–126)
Anion gap: 10 (ref 5–15)
BUN: 57 mg/dL — ABNORMAL HIGH (ref 8–23)
CO2: 18 mmol/L — ABNORMAL LOW (ref 22–32)
Calcium: 9.1 mg/dL (ref 8.9–10.3)
Chloride: 99 mmol/L (ref 98–111)
Creatinine: 2.17 mg/dL — ABNORMAL HIGH (ref 0.44–1.00)
GFR, Estimated: 24 mL/min — ABNORMAL LOW (ref >60)
Glucose, Bld: 223 mg/dL — ABNORMAL HIGH (ref 70–99)
Potassium: 4.6 mmol/L (ref 3.5–5.1)
Sodium: 127 mmol/L — ABNORMAL LOW (ref 135–145)
Total Bilirubin: 0.4 mg/dL (ref 0.3–1.2)
Total Protein: 6.3 g/dL — ABNORMAL LOW (ref 6.5–8.1)

## 2021-09-20 LAB — FERRITIN: Ferritin: 53 ng/mL (ref 11–307)

## 2021-09-20 LAB — LACTATE DEHYDROGENASE: LDH: 163 U/L (ref 98–192)

## 2021-09-20 MED ORDER — DARBEPOETIN ALFA 300 MCG/0.6ML IJ SOSY
300.0000 ug | PREFILLED_SYRINGE | Freq: Once | INTRAMUSCULAR | Status: AC
  Administered 2021-09-20: 300 ug via SUBCUTANEOUS
  Filled 2021-09-20: qty 0.6

## 2021-09-20 NOTE — Progress Notes (Signed)
No injection per MD due to elevated BP.

## 2021-09-20 NOTE — Patient Instructions (Signed)

## 2021-09-20 NOTE — Progress Notes (Signed)
Hematology and Oncology Follow Up Visit  Brittney Pace [id number] January 15, 1956 66 y.o. 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  She is not doing all that well.  She is still having a lot of problems with diarrhea.  I think this is probably from her diabetes.  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  She just feels tired.  She is worried about some palpable lymph nodes that she can feel her right inguinal area.  Again, I am not sure these are reactive or if these might suggest an underlying problem.  We cannot do a CT scan because of her renal function.  I will have to see if we can possibly do a ultrasound.  She has had no problems with fever.  There is been no bleeding.  She has had no leg swelling.  When we last saw her, her iron studies showed a ferritin of 16 with an iron saturation of 26%.  When I saw her, her TSH was 0.6.  When we last saw her, her vitamin B12 level was 247.  Overall, I would say performance status is probably ECOG 2.   Medications:  Allergies as of 09/20/2021       Reactions   Morphine Anaphylaxis, Shortness Of Breath   Penicillins Hives   Tolerated ANCEF on 04/30/20 Has patient had a PCN reaction causing immediate rash, facial/tongue/throat swelling, SOB or lightheadedness with hypotension: Yes Has patient had a PCN reaction causing severe rash involving mucus membranes or skin necrosis: No Has patient had a PCN reaction that required hospitalization No Has patient had a PCN reaction occurring within the last 10 years: No If all of the above answers are "NO", then may proceed with Cephalosporin use.   Tramadol Anaphylaxis   Acetaminophen Other (See Comments)   Alters insulin pump readings   Amitriptyline Other  (See Comments)   Severe headache/ out of body feeling   Codeine Other (See Comments)   Severe headaches/ out of body feeling   Ibuprofen Other (See Comments)   Messes up CGM reading on glucose monitor   Krill Oil Diarrhea, Itching, Nausea And Vomiting   Losartan Cough   Propoxyphene Other (See Comments)   Severe headaches / out of body feeling   Shellfish Allergy Diarrhea, Nausea And Vomiting   Statins Other (See Comments)   Muscle pains Other reaction(s): Other   Sulfamethoxazole Other (See Comments)   Mouth ulcers   Fluconazole    Other reaction(s): Contact Dermatitis (intolerance)   Norvasc [amlodipine Besylate] Swelling        Medication List        Accurate as of September 20, 2021  2:16 PM. If you have any questions, ask your nurse or doctor.          Accu-Chek Guide test strip Generic drug: glucose blood 3 (three) times daily.   AMBULATORY NON FORMULARY MEDICATION Incontinence pads per patient preference, #unlimited, refill x99 Fax to [phone]   Armour Thyroid 60 MG tablet Generic drug: thyroid Take 1 tablet by mouth daily.   aspirin EC 325 MG tablet Take 1 tablet (325 mg total) by mouth daily. What changed: when to take this   carvedilol 12.5 MG tablet Commonly known as: COREG Take 1  tablet (12.5 mg total) by mouth 2 (two) times daily with a meal.   cetirizine 10 MG tablet Commonly known as: ZYRTEC Take 10 mg by mouth daily.   Creon 3000-9500 units Cpep Generic drug: Pancrelipase (Lip-Prot-Amyl) Take 12,000 Units by mouth in the morning, at noon, and at bedtime.   Dexcom G6 Sensor Misc USE TO CONTINUOUSLY MONITOR BLOOD SUGARS. CHANGE EVERY 10 DAYS   diclofenac Sodium 1 % Gel Commonly known as: VOLTAREN Apply 2 g topically daily as needed (Pain).   diphenoxylate-atropine 2.5-0.025 MG tablet Commonly known as: LOMOTIL TAKE 1 TABLET BY MOUTH 4 TIMES DAILY AS NEEDED FOR DIARRHEA OR  LOOSE  STOOLS   esomeprazole 20 MG capsule Commonly known  as: NEXIUM Take 20 mg by mouth every morning.   gabapentin 300 MG capsule Commonly known as: NEURONTIN Take 300 mg by mouth as needed.   insulin pump Soln Inject into the skin continuous. Novolog insulin   lidocaine-prilocaine cream Commonly known as: EMLA Apply 1 application topically every 2 (two) hours as needed. Please run with good Rx discount coupon   lisinopril 10 MG tablet Commonly known as: ZESTRIL Take 10 mg by mouth daily.   nitroGLYCERIN 0.4 MG/SPRAY spray Commonly known as: NITROLINGUAL Place 1 spray under the tongue every 5 (five) minutes x 3 doses as needed for chest pain.   NovoLOG 100 UNIT/ML injection Generic drug: insulin aspart Inject 40 Units into the skin daily.   OVER THE COUNTER MEDICATION daily. Magnesium L-Treonate   Premarin vaginal cream Generic drug: conjugated estrogens every other day.   torsemide 20 MG tablet Commonly known as: [name] TAKE 1 TABLET ONCE DAILY INTHE MORNING. MAY TAKE AN   EXTRA 1/2 TABLET AS        NEEDED   valACYclovir 500 MG tablet Commonly known as: VALTREX Take 500 mg by mouth daily as needed (Flair up).        Allergies:  Allergies  Allergen Reactions   Morphine Anaphylaxis and Shortness Of Breath   Penicillins Hives    Tolerated ANCEF on 04/30/20 Has patient had a PCN reaction causing immediate rash, facial/tongue/throat swelling, SOB or lightheadedness with hypotension: Yes Has patient had a PCN reaction causing severe rash involving mucus membranes or skin necrosis: No Has patient had a PCN reaction that required hospitalization No Has patient had a PCN reaction occurring within the last 10 years: No If all of the above answers are "NO", then may proceed with Cephalosporin use.   Tramadol Anaphylaxis   Acetaminophen Other (See Comments)    Alters insulin pump readings    Amitriptyline Other (See Comments)    Severe headache/ out of body feeling   Codeine Other (See Comments)    Severe headaches/ out  of body feeling    Ibuprofen Other (See Comments)    Messes up CGM reading on glucose monitor     Krill Oil Diarrhea, Itching and Nausea And Vomiting   Losartan Cough   Propoxyphene Other (See Comments)    Severe headaches / out of body feeling    Shellfish Allergy Diarrhea and Nausea And Vomiting   Statins Other (See Comments)    Muscle pains Other reaction(s): Other   Sulfamethoxazole Other (See Comments)    Mouth ulcers    Fluconazole     Other reaction(s): Contact Dermatitis (intolerance)   Norvasc [Amlodipine Besylate] Swelling    Past Medical History, Surgical history, Social history, and Family History were reviewed and updated.  Review of Systems: Review of Systems  Constitutional:  Positive for malaise/fatigue.  HENT: Negative.    Eyes: Negative.   Respiratory: Negative.    Cardiovascular:  Positive for leg swelling.  Gastrointestinal:  Positive for abdominal pain.  Genitourinary: Negative.   Musculoskeletal:  Positive for joint pain and myalgias.  Skin:  Positive for rash.  Neurological: Negative.   Endo/Heme/Allergies: Negative.   Psychiatric/Behavioral: Negative.       Physical Exam:  height is '4\' 10"'$  (1.473 m) and weight is 103 lb 1.9 oz (46.8 kg). Her oral temperature is 97.8 F (36.6 C). Her blood pressure is 185/82 (abnormal) and her pulse is 85. Her respiration is 19 and oxygen saturation is 93%.   Wt Readings from Last 3 Encounters:  09/20/21 103 lb 1.9 oz (46.8 kg)  09/14/21 103 lb (46.7 kg)  08/09/21 108 lb (49 kg)    Physical Exam Vitals reviewed.  HENT:     Head: Normocephalic and atraumatic.  Eyes:     Pupils: Pupils are equal, round, and reactive to light.  Cardiovascular:     Rate and Rhythm: Normal rate and regular rhythm.     Heart sounds: Normal heart sounds.  Pulmonary:     Effort: Pulmonary effort is normal.     Breath sounds: Normal breath sounds.  Abdominal:     General: Bowel sounds are normal.     Palpations: Abdomen  is soft.  Musculoskeletal:        General: No tenderness or deformity. Normal range of motion.     Cervical back: Normal range of motion.     Comments: On her extremities, there is some edema in her legs.  She has about 2+ edema.  She has a macular type reddish rash in the lower extremities.  This is in the areas above her ankles.  There is a couple areas that might be a little bit open.  Lymphadenopathy:     Cervical: No cervical adenopathy.  Skin:    General: Skin is warm and dry.     Findings: No erythema or rash.  Neurological:     Mental Status: She is alert and oriented to person, place, and time.  Psychiatric:        Behavior: Behavior normal.        Thought Content: Thought content normal.        Judgment: Judgment normal.    Lab Results  Component Value Date   WBC 7.7 09/20/2021   HGB 10.6 (L) 09/20/2021   HCT 32.8 (L) 09/20/2021   MCV 80.4 09/20/2021   PLT 285 09/20/2021   Lab Results  Component Value Date   FERRITIN 16 08/09/2021   IRON 97 08/09/2021   TIBC 370 08/09/2021   UIBC 273 08/09/2021   IRONPCTSAT 26 08/09/2021   Lab Results  Component Value Date   RETICCTPCT 1.7 08/09/2021   RBC 4.08 09/20/2021   No results found for: "KPAFRELGTCHN", "LAMBDASER", "KAPLAMBRATIO" No results found for: "IGGSERUM", "IGA", "IGMSERUM" No results found for: "TOTALPROTELP", "ALBUMINELP", "A1GS", "A2GS", "BETS", "BETA2SER", "GAMS", "MSPIKE", "SPEI"   Chemistry      Component Value Date/Time   NA 127 (L) 09/20/2021 1323   NA 138 07/17/2017 1128   K 4.6 09/20/2021 1323   CL 99 09/20/2021 1323   CO2 18 (L) 09/20/2021 1323   BUN 57 (H) 09/20/2021 1323   BUN 23 07/17/2017 1128   CREATININE 2.17 (H) 09/20/2021 1323   CREATININE 0.99 07/20/2018 0821      Component Value Date/Time   CALCIUM 9.1  09/20/2021 1323   ALKPHOS 75 09/20/2021 1323   AST 22 09/20/2021 1323   ALT 12 09/20/2021 1323   BILITOT 0.4 09/20/2021 1323       Impression and Plan: Brittney Pace is a  very pleasant 66 yo female with multifactorial anemia.  She needs Aranesp unfortunately, we cannot give her Aranesp because of the high blood pressure.  Maybe, the endocrinologist can help with her diabetes.  We did recheck her blood pressure.  It was 163/80.  As such, we will go ahead and give her a dose of Aranesp today.  I will set her up with a ultrasound of her inguinal area and see what this may show.     [name], MD 8/28/20232:16 PM

## 2021-09-21 LAB — IRON AND IRON BINDING CAPACITY (CC-WL,HP ONLY)
Iron: 88 ug/dL (ref 28–170)
Saturation Ratios: 28 % (ref 10.4–31.8)
TIBC: 319 ug/dL (ref 250–450)
UIBC: 231 ug/dL (ref 148–442)

## 2021-09-23 ENCOUNTER — Other Ambulatory Visit: Payer: Self-pay | Admitting: Hematology & Oncology

## 2021-09-23 ENCOUNTER — Ambulatory Visit (HOSPITAL_BASED_OUTPATIENT_CLINIC_OR_DEPARTMENT_OTHER)
Admission: RE | Admit: 2021-09-23 | Discharge: 2021-09-23 | Disposition: A | Discharge status: Home / Self Care | Payer: Medicare Other | Source: Ambulatory Visit | Attending: Hematology & Oncology | Admitting: Hematology & Oncology

## 2021-09-23 DIAGNOSIS — R590 Localized enlarged lymph nodes: Secondary | ICD-10-CM

## 2021-09-24 ENCOUNTER — Telehealth: Payer: Self-pay

## 2021-09-24 NOTE — Telephone Encounter (Signed)
Advised via MyChart.

## 2021-09-24 NOTE — Telephone Encounter (Signed)
-----   Message from Brittney Napoleon, MD sent at 09/24/2021  1:20 PM EDT ----- Call and let her know that the ultrasound is normal.  There is no enlarged lymph nodes in the groin area.  Thanks.  Laurey Arrow

## 2021-10-22 ENCOUNTER — Encounter: Payer: Self-pay | Admitting: Hematology & Oncology

## 2021-10-22 ENCOUNTER — Inpatient Hospital Stay: Payer: Medicare Other

## 2021-10-22 ENCOUNTER — Inpatient Hospital Stay: Payer: Medicare Other | Attending: Hematology & Oncology

## 2021-10-22 ENCOUNTER — Inpatient Hospital Stay (HOSPITAL_BASED_OUTPATIENT_CLINIC_OR_DEPARTMENT_OTHER): Payer: Medicare Other | Admitting: Hematology & Oncology

## 2021-10-22 VITALS — BP 162/70 | HR 77 | Temp 97.5°F | Resp 20 | Ht 58.5 in | Wt 105.1 lb

## 2021-10-22 DIAGNOSIS — D460 Refractory anemia without ring sideroblasts, so stated: Secondary | ICD-10-CM | POA: Insufficient documentation

## 2021-10-22 DIAGNOSIS — Z79899 Other long term (current) drug therapy: Secondary | ICD-10-CM | POA: Diagnosis not present

## 2021-10-22 DIAGNOSIS — D631 Anemia in chronic kidney disease: Secondary | ICD-10-CM

## 2021-10-22 DIAGNOSIS — R718 Other abnormality of red blood cells: Secondary | ICD-10-CM

## 2021-10-22 DIAGNOSIS — E1122 Type 2 diabetes mellitus with diabetic chronic kidney disease: Secondary | ICD-10-CM | POA: Insufficient documentation

## 2021-10-22 DIAGNOSIS — Z8673 Personal history of transient ischemic attack (TIA), and cerebral infarction without residual deficits: Secondary | ICD-10-CM | POA: Diagnosis not present

## 2021-10-22 DIAGNOSIS — Z794 Long term (current) use of insulin: Secondary | ICD-10-CM | POA: Insufficient documentation

## 2021-10-22 DIAGNOSIS — R590 Localized enlarged lymph nodes: Secondary | ICD-10-CM

## 2021-10-22 LAB — CBC WITH DIFFERENTIAL (CANCER CENTER ONLY)
Abs Immature Granulocytes: 0.02 10*3/uL (ref 0.00–0.07)
Basophils Absolute: 0.1 10*3/uL (ref 0.0–0.1)
Basophils Relative: 1 %
Eosinophils Absolute: 0.5 10*3/uL (ref 0.0–0.5)
Eosinophils Relative: 7 %
HCT: 36.7 % (ref 36.0–46.0)
Hemoglobin: 11.6 g/dL — ABNORMAL LOW (ref 12.0–15.0)
Immature Granulocytes: 0 %
Lymphocytes Relative: 23 %
Lymphs Abs: 1.6 10*3/uL (ref 0.7–4.0)
MCH: 25.7 pg — ABNORMAL LOW (ref 26.0–34.0)
MCHC: 31.6 g/dL (ref 30.0–36.0)
MCV: 81.4 fL (ref 80.0–100.0)
Monocytes Absolute: 0.6 10*3/uL (ref 0.1–1.0)
Monocytes Relative: 9 %
Neutro Abs: 4.1 10*3/uL (ref 1.7–7.7)
Neutrophils Relative %: 60 %
Platelet Count: 300 10*3/uL (ref 150–400)
RBC: 4.51 MIL/uL (ref 3.87–5.11)
RDW: 16.6 % — ABNORMAL HIGH (ref 11.5–15.5)
WBC Count: 6.9 10*3/uL (ref 4.0–10.5)
nRBC: 0 % (ref 0.0–0.2)

## 2021-10-22 LAB — CMP (CANCER CENTER ONLY)
ALT: 13 U/L (ref 0–44)
AST: 20 U/L (ref 15–41)
Albumin: 4.1 g/dL (ref 3.5–5.0)
Alkaline Phosphatase: 87 U/L (ref 38–126)
Anion gap: 9 (ref 5–15)
BUN: 52 mg/dL — ABNORMAL HIGH (ref 8–23)
CO2: 26 mmol/L (ref 22–32)
Calcium: 9.6 mg/dL (ref 8.9–10.3)
Chloride: 91 mmol/L — ABNORMAL LOW (ref 98–111)
Creatinine: 2.21 mg/dL — ABNORMAL HIGH (ref 0.44–1.00)
GFR, Estimated: 24 mL/min — ABNORMAL LOW (ref >60)
Glucose, Bld: 177 mg/dL — ABNORMAL HIGH (ref 70–99)
Potassium: 4.8 mmol/L (ref 3.5–5.1)
Sodium: 126 mmol/L — ABNORMAL LOW (ref 135–145)
Total Bilirubin: 0.4 mg/dL (ref 0.3–1.2)
Total Protein: 6.7 g/dL (ref 6.5–8.1)

## 2021-10-22 LAB — IRON AND IRON BINDING CAPACITY (CC-WL,HP ONLY)
Iron: 91 ug/dL (ref 28–170)
Saturation Ratios: 25 % (ref 10.4–31.8)
TIBC: 361 ug/dL (ref 250–450)
UIBC: 270 ug/dL (ref 148–442)

## 2021-10-22 LAB — RETICULOCYTES
Immature Retic Fract: 1.3 % — ABNORMAL LOW (ref 2.3–15.9)
RBC.: 4.45 MIL/uL (ref 3.87–5.11)
Retic Count, Absolute: 24 10*3/uL (ref 19.0–186.0)
Retic Ct Pct: 0.5 % (ref 0.4–3.1)

## 2021-10-22 LAB — LACTATE DEHYDROGENASE: LDH: 147 U/L (ref 98–192)

## 2021-10-22 LAB — FERRITIN: Ferritin: 44 ng/mL (ref 11–307)

## 2021-10-22 NOTE — Progress Notes (Signed)
Hematology and Oncology Follow Up Visit  Brittney Pace [id number] Aug 06, 1955 66 y.o. 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  Brittney Pace is not doing all that well.  Brittney Pace has had a lot of stress this week.  Apparently, there has been issues with respect to Brittney Pace diabetes equipment.  Brittney Pace may need to file a lawsuit against the company.  Brittney Pace is also remodeling a bathroom.  This is also somewhat stressful for Brittney Pace.  Brittney Pace just does not have a lot of energy.  Brittney Pace is still having a lot of problems with diarrhea.  I think this is probably from Brittney Pace diabetes.  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  Brittney Pace just feels tired.  When we last saw Brittney Pace back in August, Brittney Pace ferritin was 53 with an iron saturation of 28%.  Brittney Pace has had no obvious change in bowel or bladder habits.  We did do an ultrasound of Brittney Pace pelvis.  This was done about 3 weeks ago.  There is no evidence of lymphadenopathy in the inguinal area.  There has been no fever.  Brittney Pace has had no obvious bleeding.  Currently, I would say that Brittney Pace performance status is probably ECOG 2.     Medications:  Allergies as of 10/22/2021       Reactions   Morphine Anaphylaxis, Shortness Of Breath   Penicillins Hives   Tolerated ANCEF on 04/30/20 Has patient had a PCN reaction causing immediate rash, facial/tongue/throat swelling, SOB or lightheadedness with hypotension: Yes Has patient had a PCN reaction causing severe rash involving mucus membranes or skin necrosis: No Has patient had a PCN reaction that required hospitalization No Has patient had a PCN reaction occurring within the last 10 years: No If all of the above answers are "NO", then may proceed with Cephalosporin use.   Tramadol Anaphylaxis   Acetaminophen  Other (See Comments)   Alters insulin pump readings   Amitriptyline Other (See Comments)   Severe headache/ out of body feeling   Codeine Other (See Comments)   Severe headaches/ out of body feeling   Ibuprofen Other (See Comments)   Messes up CGM reading on glucose monitor   Krill Oil Diarrhea, Itching, Nausea And Vomiting   Losartan Cough   Propoxyphene Other (See Comments)   Severe headaches / out of body feeling   Shellfish Allergy Diarrhea, Nausea And Vomiting   Statins Other (See Comments)   Muscle pains Other reaction(s): Other   Sulfamethoxazole Other (See Comments)   Mouth ulcers   Fluconazole    Other reaction(s): Contact Dermatitis (intolerance)   Norvasc [amlodipine Besylate] Swelling        Medication List        Accurate as of October 22, 2021  2:25 PM. If you have any questions, ask your nurse or doctor.          Accu-Chek Guide test strip Generic drug: glucose blood 3 (three) times daily.   AMBULATORY NON FORMULARY MEDICATION Incontinence pads per patient preference, #unlimited, refill x99 Fax to [phone]   [employer]our Thyroid 60 MG tablet Generic drug: thyroid Take 1 tablet by mouth daily.   aspirin EC 325 MG tablet Take 1 tablet (325 mg total) by mouth daily. What changed: when to take  this   carvedilol 12.5 MG tablet Commonly known as: COREG Take 1 tablet (12.5 mg total) by mouth 2 (two) times daily with a meal.   cetirizine 10 MG tablet Commonly known as: ZYRTEC Take 10 mg by mouth daily.   Creon 3000-9500 units Cpep Generic drug: Pancrelipase (Lip-Prot-Amyl) Take 12,000 Units by mouth daily.   Dexcom G6 Sensor Misc USE TO CONTINUOUSLY MONITOR BLOOD SUGARS. CHANGE EVERY 10 DAYS   diclofenac Sodium 1 % Gel Commonly known as: VOLTAREN Apply 2 g topically daily as needed (Pain).   diphenoxylate-atropine 2.5-0.025 MG tablet Commonly known as: LOMOTIL TAKE 1 TABLET BY MOUTH 4 TIMES DAILY AS NEEDED FOR DIARRHEA OR  LOOSE  STOOLS    esomeprazole 20 MG capsule Commonly known as: NEXIUM Take 20 mg by mouth every morning.   gabapentin 300 MG capsule Commonly known as: NEURONTIN Take 300 mg by mouth as needed.   insulin pump Soln Inject into the skin continuous. Novolog insulin   ketoconazole 2 % shampoo Commonly known as: NIZORAL SMARTSIG:Topical 2-3 Times Weekly   lidocaine-prilocaine cream Commonly known as: EMLA Apply 1 application topically every 2 (two) hours as needed. Please run with good Rx discount coupon   lisinopril 10 MG tablet Commonly known as: ZESTRIL Take 10 mg by mouth daily.   mirtazapine 30 MG tablet Commonly known as: REMERON Take 30 mg by mouth at bedtime.   mometasone 0.1 % lotion Commonly known as: ELOCON daily as needed.   nitroGLYCERIN 0.4 MG/SPRAY spray Commonly known as: NITROLINGUAL Place 1 spray under the tongue every 5 (five) minutes x 3 doses as needed for chest pain.   NovoLOG 100 UNIT/ML injection Generic drug: insulin aspart Inject 40 Units into the skin daily.   OVER THE COUNTER MEDICATION daily. Magnesium L-Treonate   Premarin vaginal cream Generic drug: conjugated estrogens every other day.   torsemide 20 MG tablet Commonly known as: [name] TAKE 1 TABLET ONCE DAILY INTHE MORNING. MAY TAKE AN   EXTRA 1/2 TABLET AS        NEEDED   valACYclovir 500 MG tablet Commonly known as: VALTREX Take 500 mg by mouth daily as needed (Flair up).        Allergies:  Allergies  Allergen Reactions   Morphine Anaphylaxis and Shortness Of Breath   Penicillins Hives    Tolerated ANCEF on 04/30/20 Has patient had a PCN reaction causing immediate rash, facial/tongue/throat swelling, SOB or lightheadedness with hypotension: Yes Has patient had a PCN reaction causing severe rash involving mucus membranes or skin necrosis: No Has patient had a PCN reaction that required hospitalization No Has patient had a PCN reaction occurring within the last 10 years: No If all of the  above answers are "NO", then may proceed with Cephalosporin use.   Tramadol Anaphylaxis   Acetaminophen Other (See Comments)    Alters insulin pump readings    Amitriptyline Other (See Comments)    Severe headache/ out of body feeling   Codeine Other (See Comments)    Severe headaches/ out of body feeling    Ibuprofen Other (See Comments)    Messes up CGM reading on glucose monitor     Krill Oil Diarrhea, Itching and Nausea And Vomiting   Losartan Cough   Propoxyphene Other (See Comments)    Severe headaches / out of body feeling    Shellfish Allergy Diarrhea and Nausea And Vomiting   Statins Other (See Comments)    Muscle pains Other reaction(s): Other   Sulfamethoxazole Other (  See Comments)    Mouth ulcers    Fluconazole     Other reaction(s): Contact Dermatitis (intolerance)   Norvasc [Amlodipine Besylate] Swelling    Past Medical History, Surgical history, Social history, and Family History were reviewed and updated.  Review of Systems: Review of Systems  Constitutional:  Positive for malaise/fatigue.  HENT: Negative.    Eyes: Negative.   Respiratory: Negative.    Cardiovascular:  Positive for leg swelling.  Gastrointestinal:  Positive for abdominal pain.  Genitourinary: Negative.   Musculoskeletal:  Positive for joint pain and myalgias.  Skin:  Positive for rash.  Neurological: Negative.   Endo/Heme/Allergies: Negative.   Psychiatric/Behavioral: Negative.       Physical Exam:  height is 4' 10.5" (1.486 m) and weight is 105 lb 1.3 oz (47.7 kg). Brittney Pace oral temperature is 97.5 F (36.4 C) (abnormal). Brittney Pace blood pressure is 162/70 (abnormal) and Brittney Pace pulse is 77. Brittney Pace respiration is 20 and oxygen saturation is 98%.   Wt Readings from Last 3 Encounters:  10/22/21 105 lb 1.3 oz (47.7 kg)  09/20/21 103 lb 1.9 oz (46.8 kg)  09/14/21 103 lb (46.7 kg)    Physical Exam Vitals reviewed.  HENT:     Head: Normocephalic and atraumatic.  Eyes:     Pupils: Pupils are  equal, round, and reactive to light.  Cardiovascular:     Rate and Rhythm: Normal rate and regular rhythm.     Heart sounds: Normal heart sounds.  Pulmonary:     Effort: Pulmonary effort is normal.     Breath sounds: Normal breath sounds.  Abdominal:     General: Bowel sounds are normal.     Palpations: Abdomen is soft.  Musculoskeletal:        General: No tenderness or deformity. Normal range of motion.     Cervical back: Normal range of motion.     Comments: On Brittney Pace extremities, there is some edema in Brittney Pace legs.  Brittney Pace has about 2+ edema.  Brittney Pace has a macular type reddish rash in the lower extremities.  This is in the areas above Brittney Pace ankles.  There is a couple areas that might be a little bit open.  Lymphadenopathy:     Cervical: No cervical adenopathy.  Skin:    General: Skin is warm and dry.     Findings: No erythema or rash.  Neurological:     Mental Status: Brittney Pace is alert and oriented to person, place, and time.  Psychiatric:        Behavior: Behavior normal.        Thought Content: Thought content normal.        Judgment: Judgment normal.     Lab Results  Component Value Date   WBC 6.9 10/22/2021   HGB 11.6 (L) 10/22/2021   HCT 36.7 10/22/2021   MCV 81.4 10/22/2021   PLT 300 10/22/2021   Lab Results  Component Value Date   FERRITIN 53 09/20/2021   IRON 88 09/20/2021   TIBC 319 09/20/2021   UIBC 231 09/20/2021   IRONPCTSAT 28 09/20/2021   Lab Results  Component Value Date   RETICCTPCT 0.5 10/22/2021   RBC 4.45 10/22/2021   RBC 4.51 10/22/2021   No results found for: "KPAFRELGTCHN", "LAMBDASER", "KAPLAMBRATIO" No results found for: "IGGSERUM", "IGA", "IGMSERUM" No results found for: "TOTALPROTELP", "ALBUMINELP", "A1GS", "A2GS", "BETS", "BETA2SER", "GAMS", "MSPIKE", "SPEI"   Chemistry      Component Value Date/Time   NA 126 (L) 10/22/2021 1138   NA 138  07/17/2017 1128   K 4.8 10/22/2021 1138   CL 91 (L) 10/22/2021 1138   CO2 26 10/22/2021 1138   BUN 52 (H)  10/22/2021 1138   BUN 23 07/17/2017 1128   CREATININE 2.21 (H) 10/22/2021 1138   CREATININE 0.99 07/20/2018 0821      Component Value Date/Time   CALCIUM 9.6 10/22/2021 1138   ALKPHOS 87 10/22/2021 1138   AST 20 10/22/2021 1138   ALT 13 10/22/2021 1138   BILITOT 0.4 10/22/2021 1138       Impression and Plan: Brittney Pace is a very pleasant 66 yo female with multifactorial Pace.  I am so happy that Brittney Pace does not need any Aranesp today.  Brittney Pace hemoglobin is quite high.  I am sure Brittney Pace iron level should be okay.  Brittney Pace blood sugars are all that bad.  I would think that the insulin pump should be working fairly well for Brittney Pace.  Brittney Pace blood pressure is on the higher side.  However, Brittney Pace says when Brittney Pace checks it at home, it is within range.  We will go ahead and plan to get Brittney Pace back now in November.  I would like to see Brittney Pace back before the [place] season.    [name], MD 9/29/20232:25 PM

## 2021-10-25 ENCOUNTER — Encounter: Payer: Self-pay | Admitting: *Deleted

## 2021-11-05 ENCOUNTER — Encounter (HOSPITAL_BASED_OUTPATIENT_CLINIC_OR_DEPARTMENT_OTHER): Payer: Self-pay

## 2021-11-05 ENCOUNTER — Emergency Department (HOSPITAL_BASED_OUTPATIENT_CLINIC_OR_DEPARTMENT_OTHER)
Admission: EM | Admit: 2021-11-05 | Discharge: 2021-11-05 | Disposition: A | Discharge status: Home / Self Care | Payer: Medicare Other | Attending: Emergency Medicine | Admitting: Emergency Medicine

## 2021-11-05 ENCOUNTER — Other Ambulatory Visit: Payer: Self-pay

## 2021-11-05 DIAGNOSIS — N3000 Acute cystitis without hematuria: Secondary | ICD-10-CM

## 2021-11-05 DIAGNOSIS — Z7982 Long term (current) use of aspirin: Secondary | ICD-10-CM | POA: Diagnosis not present

## 2021-11-05 DIAGNOSIS — N189 Chronic kidney disease, unspecified: Secondary | ICD-10-CM | POA: Diagnosis not present

## 2021-11-05 DIAGNOSIS — E1022 Type 1 diabetes mellitus with diabetic chronic kidney disease: Secondary | ICD-10-CM | POA: Insufficient documentation

## 2021-11-05 DIAGNOSIS — I2510 Atherosclerotic heart disease of native coronary artery without angina pectoris: Secondary | ICD-10-CM | POA: Diagnosis not present

## 2021-11-05 DIAGNOSIS — Z794 Long term (current) use of insulin: Secondary | ICD-10-CM | POA: Diagnosis not present

## 2021-11-05 DIAGNOSIS — R300 Dysuria: Secondary | ICD-10-CM | POA: Diagnosis present

## 2021-11-05 LAB — URINALYSIS, ROUTINE W REFLEX MICROSCOPIC
Bilirubin Urine: NEGATIVE
Glucose, UA: NEGATIVE mg/dL
Ketones, ur: NEGATIVE mg/dL
Nitrite: NEGATIVE
Protein, ur: >=300 mg/dL — AB
Specific Gravity, Urine: 1.01 (ref 1.005–1.030)
pH: 5.5 (ref 5.0–8.0)

## 2021-11-05 LAB — URINALYSIS, MICROSCOPIC (REFLEX): WBC, UA: >50 WBC/hpf (ref 0–5)

## 2021-11-05 LAB — CBG MONITORING, ED: Glucose-Capillary: 185 mg/dL — ABNORMAL HIGH (ref 70–99)

## 2021-11-05 MED ORDER — CIPROFLOXACIN HCL 500 MG PO TABS
500.0000 mg | ORAL_TABLET | Freq: Once | ORAL | Status: AC
  Administered 2021-11-05: 500 mg via ORAL
  Filled 2021-11-05: qty 1

## 2021-11-05 MED ORDER — CIPROFLOXACIN HCL 500 MG PO TABS: 500.0000 mg | ORAL_TABLET | Freq: Two times a day (BID) | ORAL | 0 refills | Status: DC

## 2021-11-05 NOTE — ED Notes (Addendum)
Pts CBG 185. RN notified.

## 2021-11-05 NOTE — ED Provider Notes (Signed)
[facility]PARTMENT Provider Note   CSN: [id number] Arrival date & time: 11/05/21  1121    History  Chief Complaint  Patient presents with   Dysuria    Brittney Pace is a 66 y.o. female history of type 1 diabetes on pump, PAD, hypothyroid, erythropoietin deficiency anemia, recurrent UTI for evaluation of UTI symptoms.  Had some slight burning with urination yesterday however more pronounced this morning.  Noticed some urgency and frequency.  States this feels like her typical UTI.  Blood sugars have been at her normal range in the 200s according to her Dexcom.  She denies any fever, nausea, vomiting, chest pain, shortness of breath, flank pain, abdominal pain, hematuria.  No vaginal discharge.  States typically her urinalysis does show UTI however culture grows back positive.  Prior UTIs show recurrent Klebsiella many infection.  Last 3 cultures sensitive to Bactrim, Rocephin, Cipro.  HPI     Home Medications Prior to Admission medications   Medication Sig Start Date End Date Taking? Authorizing Provider  ciprofloxacin (CIPRO) 500 MG tablet Take 1 tablet (500 mg total) by mouth every 12 (twelve) hours. 11/05/21  Yes Miasia Crabtree A, PA-C  ACCU-CHEK GUIDE test strip 3 (three) times daily. 05/03/21   [provider]  AMBULATORY NON FORMULARY MEDICATION Incontinence pads per patient preference, #unlimited, refill x99 Fax to [phone] 12/25/18   Emeterio Reeve, DO  aspirin EC 325 MG tablet Take 1 tablet (325 mg total) by mouth daily. Patient taking differently: Take 325 mg by mouth at bedtime. 03/06/19   Lelon Perla, MD  carvedilol (COREG) 12.5 MG tablet Take 1 tablet (12.5 mg total) by mouth 2 (two) times daily with a meal. 05/07/21   Crenshaw, Denice Bors, MD  cetirizine (ZYRTEC) 10 MG tablet Take 10 mg by mouth daily. 03/26/21   [provider]  Continuous Blood Gluc Sensor (DEXCOM G6 SENSOR) MISC USE TO CONTINUOUSLY MONITOR BLOOD  SUGARS. CHANGE EVERY 10 DAYS 04/02/19   [provider]  diphenoxylate-atropine (LOMOTIL) 2.5-0.025 MG tablet TAKE 1 TABLET BY MOUTH 4 TIMES DAILY AS NEEDED FOR DIARRHEA OR  LOOSE  STOOLS 12/24/20   [name], MD  esomeprazole (NEXIUM) 20 MG capsule Take 20 mg by mouth every morning.     [provider]  Insulin Human (INSULIN PUMP) SOLN Inject into the skin continuous. Novolog insulin    [provider]  ketoconazole (NIZORAL) 2 % shampoo SMARTSIG:Topical 2-3 Times Weekly 09/29/21   [provider]  lisinopril (ZESTRIL) 10 MG tablet Take 10 mg by mouth daily. 03/24/21   [provider]  mirtazapine (REMERON) 30 MG tablet Take 30 mg by mouth at bedtime. 10/15/21   [provider]  mometasone (ELOCON) 0.1 % lotion daily as needed. 10/08/21   [provider]  NOVOLOG 100 UNIT/ML injection Inject 40 Units into the skin daily. Patient not taking: Reported on 09/20/2021 06/08/20   [provider]  [place] daily. Magnesium L-Treonate    [provider]  Pancrelipase, Lip-Prot-Amyl, (CREON) 3000-9500 units CPEP Take 12,000 Units by mouth daily. 04/14/21   [provider]  PREMARIN vaginal cream every other day. 03/24/21   [provider]  thyroid (ARMOUR THYROID) 60 MG tablet Take 1 tablet by mouth daily. 07/30/21   [provider]  torsemide ([name]) 20 MG tablet TAKE 1 TABLET ONCE DAILY INTHE MORNING. MAY TAKE AN   EXTRA 1/2 TABLET AS        NEEDED  01/15/18   Trixie Dredge, PA-C  valACYclovir (VALTREX) 500 MG tablet Take 500 mg by mouth daily as needed (Flair up).    [provider]      Allergies    Morphine, Penicillins, Tramadol, Acetaminophen, Amitriptyline, Codeine, Ibuprofen, Krill oil, Losartan, Propoxyphene, Shellfish allergy, Statins, Sulfamethoxazole, Fluconazole, and Norvasc [amlodipine besylate]    Review of Systems   Review of Systems   Constitutional: Negative.   HENT: Negative.    Respiratory: Negative.    Cardiovascular: Negative.   Gastrointestinal: Negative.   Genitourinary:  Positive for dysuria, frequency and urgency. Negative for decreased urine volume, difficulty urinating, dyspareunia, enuresis, flank pain, genital sores, hematuria, menstrual problem, pelvic pain, vaginal bleeding, vaginal discharge and vaginal pain.  Musculoskeletal: Negative.   Skin: Negative.   Neurological: Negative.   All other systems reviewed and are negative.   Physical Exam Updated Vital Signs BP 112/87   Pulse 62   Temp 98.2 F (36.8 C) (Oral)   Resp 18   Ht '4\' 10"'$  (1.473 m)   Wt 47.6 kg   SpO2 99%   BMI 21.95 kg/m  Physical Exam Vitals and nursing note reviewed.  Constitutional:      General: She is not in acute distress.    Appearance: She is well-developed. She is not ill-appearing, toxic-appearing or diaphoretic.  HENT:     Head: Normocephalic and atraumatic.     Nose: Nose normal.     Mouth/Throat:     Mouth: Mucous membranes are moist.  Eyes:     Pupils: Pupils are equal, round, and reactive to light.  Cardiovascular:     Rate and Rhythm: Normal rate.     Pulses: Normal pulses.     Heart sounds: Normal heart sounds.  Pulmonary:     Effort: Pulmonary effort is normal. No respiratory distress.     Breath sounds: Normal breath sounds.  Abdominal:     General: Bowel sounds are normal. There is no distension.     Palpations: Abdomen is soft. There is no mass.     Tenderness: There is no abdominal tenderness. There is no right CVA tenderness, left CVA tenderness or guarding.     Hernia: No hernia is present.  Musculoskeletal:        General: No swelling, tenderness, deformity or signs of injury. Normal range of motion.     Cervical back: Normal range of motion.  Skin:    General: Skin is warm and dry.     Capillary Refill: Capillary refill takes less than 2 seconds.  Neurological:     General: No focal  deficit present.     Mental Status: She is alert and oriented to person, place, and time.  Psychiatric:        Mood and Affect: Mood normal.     ED Results / Procedures / Treatments   Labs (all labs ordered are listed, but only abnormal results are displayed) Labs Reviewed  URINALYSIS, ROUTINE W REFLEX MICROSCOPIC - Abnormal; Notable for the following components:      Result Value   APPearance HAZY (*)    Hgb urine dipstick LARGE (*)    Protein, ur >=300 (*)    Leukocytes,Ua MODERATE (*)    All other components within normal limits  URINALYSIS, MICROSCOPIC (REFLEX) - Abnormal; Notable for the following components:   Bacteria, UA MANY (*)    All other components within normal limits  CBG MONITORING, ED - Abnormal; Notable for the following components:   Glucose-Capillary  185 (*)    All other components within normal limits  URINE CULTURE    EKG None  Radiology No results found.  Procedures Procedures    Medications Ordered in ED Medications  ciprofloxacin (CIPRO) tablet 500 mg (500 mg Oral Given 11/05/21 1224)    ED Course/ Medical Decision Making/ A&P    66 year old history of type 1 diabetes, recurrent UTI, CAD, CKD, erythropoietin deficiency anemia here for evaluation of UTI symptoms.  Symptoms began yesterday evening however more pronounced this morning.  Feels like her typical UTI.  States she has some dysuria, frequency and urgency.  She denies any fever, nausea, vomiting, abdominal pain, flank pain.  No vaginal discharge.  Has not noted any hematuria.  Her blood sugars at home have been at her baseline in the low 200s, high 100s.  Polyuria polydipsia.  No altered mental status.  She is stable vital signs.  We will plan on urinalysis, labs.  Labs personally viewed and interpreted:  CBG 185 UA positive for infection, culture sent   Patient reassessed.  She declines labs at this time.  I feel this is reasonable given she has stable vital signs and is without  systemic sx.  She does realize cannot rule out acute renal dysfunction, significant leukocytosis at this time without labs.  She continues to decline.  Patient reassessed.  Urinalysis reviewed with patient, family in room.  Her UA is consistent with UTI.  I reviewed her prior urine cultures from prior visits.  Sensitive to Keflex, Cipro.  She does have allergy to penicillins.  She states she feels comfortable to ciprofloxacin as she has had this multiple times for her UTIs.  First dose given here in ED.  I sent her prescription into her pharmacy.  I discussed close follow-up with PCP, return for new or worsening symptoms.  Low suspicion for DKA, sepsis, infected stone, perforated viscus, obstruction, intra-abdominal abscess.  The patient has been appropriately medically screened and/or stabilized in the ED. I have low suspicion for any other emergent medical condition which would require further screening, evaluation or treatment in the ED or require inpatient management.  Patient is hemodynamically stable and in no acute distress.  Patient able to ambulate in department prior to ED.  Evaluation does not show acute pathology that would require ongoing or additional emergent interventions while in the emergency department or further inpatient treatment.  I have discussed the diagnosis with the patient and answered all questions.  Pain is been managed while in the emergency department and patient has no further complaints prior to discharge.  Patient is comfortable with plan discussed in room and is stable for discharge at this time.  I have discussed strict return precautions for returning to the emergency department.  Patient was encouraged to follow-up with PCP/specialist refer to at discharge.                            Medical Decision Making Amount and/or Complexity of Data Reviewed Independent Historian:     Details: Family in room External Data Reviewed: labs, radiology and notes. Labs:  ordered. Decision-making details documented in ED Course.  Risk OTC drugs. Prescription drug management. Decision regarding hospitalization. Diagnosis or treatment significantly limited by social determinants of health.          Final Clinical Impression(s) / ED Diagnoses Final diagnoses:  Acute cystitis without hematuria    Rx / DC Orders ED Discharge Orders  Ordered    ciprofloxacin (CIPRO) 500 MG tablet  Every 12 hours        11/05/21 1231              Charlise Giovanetti A, PA-C 11/05/21 1238    Ezequiel Essex, MD 11/05/21 1540

## 2021-11-05 NOTE — Discharge Instructions (Addendum)
Urinalysis did show that you have a urinary tract infection.  I reviewed your prior urine cultures.  They are sensitive to ciprofloxacin.  You are given your first dose here.  We will send you home with prescription.  Make sure to follow-up outpatient with primary care provider.  Make sure to check your blood sugars frequently as infections can cause your blood sugars to be elevated.  Follow-up with primary care provider  Return for new or worsening symptoms

## 2021-11-05 NOTE — ED Notes (Addendum)
Patient states that she started having urinary symptoms yesterday. Patient reports dysuria.  Denies any Hematuria . States that her urine has a smell

## 2021-11-05 NOTE — ED Triage Notes (Signed)
Patient reports burning, frequency and urgency.  Reports "lifetime of these".  CBG 203 off dexicom.  Patient denies fever nausea or vomiting.

## 2021-11-08 LAB — URINE CULTURE: Culture: >=100000 — AB

## 2021-11-09 ENCOUNTER — Telehealth (HOSPITAL_BASED_OUTPATIENT_CLINIC_OR_DEPARTMENT_OTHER): Payer: Self-pay | Admitting: *Deleted

## 2021-11-09 NOTE — Progress Notes (Addendum)
ED Antimicrobial Stewardship Positive Culture Follow Up   Brittney Pace is an 66 y.o. female who presented to [facility] on 11/05/2021 with a chief complaint of urinary frequency, urgency, and burning. Patient has a history of recurrent UTIs, most recently growing klebsiella in January 2023. Chief Complaint  Patient presents with   Dysuria    Recent Results (from the past 720 hour(s))  Urine Culture     Status: Abnormal   Collection Time: 11/05/21 11:17 AM   Specimen: Urine, Random  Result Value Ref Range Status   Specimen Description   Final    URINE, RANDOM Performed at [facility], [address] [place], [place] [id number]    Special Requests   Final    NONE Performed at [facility], [address] [place], [place] [address]    Culture (A)  Final    >=100,000 COLONIES/mL KLEBSIELLA PNEUMONIAE >=100,000 COLONIES/mL ENTEROCOCCUS FAECALIS    Report Status 11/08/2021 FINAL  Final   Organism ID, Bacteria KLEBSIELLA PNEUMONIAE (A)  Final   Organism ID, Bacteria ENTEROCOCCUS FAECALIS (A)  Final      Susceptibility   Enterococcus faecalis - MIC*    AMPICILLIN <=2 SENSITIVE Sensitive     NITROFURANTOIN <=16 SENSITIVE Sensitive     VANCOMYCIN 1 SENSITIVE Sensitive     * >=100,000 COLONIES/mL ENTEROCOCCUS FAECALIS   Klebsiella pneumoniae - MIC*    AMPICILLIN >=32 RESISTANT Resistant     CEFAZOLIN <=4 SENSITIVE Sensitive     CEFEPIME <=0.12 SENSITIVE Sensitive     CEFTRIAXONE <=0.25 SENSITIVE Sensitive     CIPROFLOXACIN 1 RESISTANT Resistant     GENTAMICIN <=1 SENSITIVE Sensitive     IMIPENEM 0.5 SENSITIVE Sensitive     NITROFURANTOIN 64 INTERMEDIATE Intermediate     TRIMETH/SULFA >=320 RESISTANT Resistant     AMPICILLIN/SULBACTAM 8 SENSITIVE Sensitive     PIP/TAZO <=4 SENSITIVE Sensitive     * >=100,000 COLONIES/mL KLEBSIELLA PNEUMONIAE   Patient presenting with urinary symptoms (no reports of flank/back pain or fever) and sent home on  ciprofloxacin. Per micro, the enterococcus faecalis growing in the urine is sensitive to levofloxacin, however, the klebsiella is resistant.   Recommend continuing the ciprofloxacin 500 mg twice daily x5 days to cover the enterococcus, and starting Keflex 500 mg twice daily for 7 days to cover the klebsiella.  New antibiotic prescription: Keflex 500 mg twice daily x7 days  ED Provider: Teressa Lower, MD  Louanne Belton, PharmD, [facility] PGY1 Pharmacy Resident 11/09/2021 9:00 AM

## 2021-11-09 NOTE — Telephone Encounter (Signed)
Post ED Visit - Positive Culture Follow-up: Successful Patient Follow-Up  Culture assessed and recommendations reviewed by:  '[]'$  Elenor Quinones, Pharm.D. '[]'$  Heide Guile, Pharm.D., BCPS AQ-ID '[]'$  Parks Neptune, Pharm.D., BCPS '[]'$  Alycia Rossetti, Pharm.D., BCPS '[]'$  [place], Pharm.D., BCPS, AAHIVP '[]'$  Legrand Como, Pharm.D., BCPS, AAHIVP '[]'$  Salome Arnt, PharmD, BCPS '[]'$  Johnnette Gourd, PharmD, BCPS '[]'$  [place] Better, PharmD, BCPS '[]'$  Leeroy Cha, PharmD  Positive urine culture  '[x]'$  Patient discharged without antimicrobial prescription and treatment is now indicated '[]'$  Organism is resistant to prescribed ED discharge antimicrobial '[]'$  Patient with positive blood cultures  Changes discussed with ED provider: Teressa Lower, MD New antibiotic prescription:  Continue Ciprofloxacin.  Start Keflex '500mg'$  PO BID x 7 days Called to [place], [place], [place]ond[place] Allegra [phone]  Contacted patient, date 11/09/2021, time [place] 11/09/2021, 10:07 AM

## 2021-11-18 ENCOUNTER — Other Ambulatory Visit: Payer: Self-pay

## 2021-11-18 ENCOUNTER — Encounter (HOSPITAL_BASED_OUTPATIENT_CLINIC_OR_DEPARTMENT_OTHER): Payer: Self-pay

## 2021-11-18 ENCOUNTER — Emergency Department (HOSPITAL_BASED_OUTPATIENT_CLINIC_OR_DEPARTMENT_OTHER)
Admission: EM | Admit: 2021-11-18 | Discharge: 2021-11-18 | Disposition: A | Discharge status: Home / Self Care | Payer: Medicare Other | Attending: Emergency Medicine | Admitting: Emergency Medicine

## 2021-11-18 DIAGNOSIS — E1022 Type 1 diabetes mellitus with diabetic chronic kidney disease: Secondary | ICD-10-CM | POA: Diagnosis not present

## 2021-11-18 DIAGNOSIS — N183 Chronic kidney disease, stage 3 unspecified: Secondary | ICD-10-CM | POA: Insufficient documentation

## 2021-11-18 DIAGNOSIS — Z7982 Long term (current) use of aspirin: Secondary | ICD-10-CM | POA: Insufficient documentation

## 2021-11-18 DIAGNOSIS — Z794 Long term (current) use of insulin: Secondary | ICD-10-CM | POA: Insufficient documentation

## 2021-11-18 DIAGNOSIS — R300 Dysuria: Secondary | ICD-10-CM | POA: Insufficient documentation

## 2021-11-18 LAB — URINALYSIS, MICROSCOPIC (REFLEX): RBC / HPF: NONE SEEN RBC/hpf (ref 0–5)

## 2021-11-18 LAB — URINALYSIS, ROUTINE W REFLEX MICROSCOPIC
Bilirubin Urine: NEGATIVE
Glucose, UA: NEGATIVE mg/dL
Hgb urine dipstick: NEGATIVE
Ketones, ur: NEGATIVE mg/dL
Leukocytes,Ua: NEGATIVE
Nitrite: NEGATIVE
Protein, ur: 100 mg/dL — AB
Specific Gravity, Urine: <=1.005 (ref 1.005–1.030)
pH: 5.5 (ref 5.0–8.0)

## 2021-11-18 NOTE — Discharge Instructions (Signed)
You were seen today for painful urination. Your urinalysis shows no signs of infection at this time. You will be contacted if your urine culture grows an organism. Please follow up as needed with your primary care provider.

## 2021-11-18 NOTE — ED Triage Notes (Signed)
Patient reports has done two rounds of antibiotics for UTI and has a hx of having urosepsis.  Reports still feeling the burning and having chills.

## 2021-11-18 NOTE — ED Provider Notes (Signed)
[facility] EMERGENCY DEPARTMENT Provider Note   CSN: [id number] Arrival date & time: 11/18/21  1415     History  Chief Complaint  Patient presents with   Dysuria    Brittney Pace is a 66 y.o. female.  Patient presents to the hospital with chief concern of dysuria.  Patient states she was evaluated at the emergency department on October 13.  This was due to dysuria and a urinary tract infection.  The patient states that she has had 2 different antibiotics since that time.  She states she finished the second antibiotic yesterday.  She complains of continued dysuria today.  She states she has had urinary tract infections in the past which were resistant medications.  She denies nausea, vomiting, fever, hematuria, abdominal pain.  Past medical history significant for type 1 diabetes mellitus, stage III chronic kidney disease, IBS, Anemia HPI     Home Medications Prior to Admission medications   Medication Sig Start Date End Date Taking? Authorizing Provider  ACCU-CHEK GUIDE test strip 3 (three) times daily. 05/03/21   [provider]  AMBULATORY NON FORMULARY MEDICATION Incontinence pads per patient preference, #unlimited, refill x99 Fax to [phone] 12/25/18   Emeterio Reeve, DO  aspirin EC 325 MG tablet Take 1 tablet (325 mg total) by mouth daily. Patient taking differently: Take 325 mg by mouth at bedtime. 03/06/19   Lelon Perla, MD  carvedilol (COREG) 12.5 MG tablet Take 1 tablet (12.5 mg total) by mouth 2 (two) times daily with a meal. 05/07/21   Crenshaw, Denice Bors, MD  cetirizine (ZYRTEC) 10 MG tablet Take 10 mg by mouth daily. 03/26/21   [provider]  ciprofloxacin (CIPRO) 500 MG tablet Take 1 tablet (500 mg total) by mouth every 12 (twelve) hours. 11/05/21   Henderly, Britni A, PA-C  Continuous Blood Gluc Sensor (DEXCOM G6 SENSOR) MISC USE TO CONTINUOUSLY MONITOR BLOOD SUGARS. CHANGE EVERY 10 DAYS 04/02/19   [provider]   diphenoxylate-atropine (LOMOTIL) 2.5-0.025 MG tablet TAKE 1 TABLET BY MOUTH 4 TIMES DAILY AS NEEDED FOR DIARRHEA OR  LOOSE  STOOLS 12/24/20   [name], MD  esomeprazole (NEXIUM) 20 MG capsule Take 20 mg by mouth every morning.     [provider]  Insulin Human (INSULIN PUMP) SOLN Inject into the skin continuous. Novolog insulin    [provider]  ketoconazole (NIZORAL) 2 % shampoo SMARTSIG:Topical 2-3 Times Weekly 09/29/21   [provider]  lisinopril (ZESTRIL) 10 MG tablet Take 10 mg by mouth daily. 03/24/21   [provider]  mirtazapine (REMERON) 30 MG tablet Take 30 mg by mouth at bedtime. 10/15/21   [provider]  mometasone (ELOCON) 0.1 % lotion daily as needed. 10/08/21   [provider]  NOVOLOG 100 UNIT/ML injection Inject 40 Units into the skin daily. Patient not taking: Reported on 09/20/2021 06/08/20   [provider]  [place] daily. Magnesium L-Treonate    [provider]  Pancrelipase, Lip-Prot-Amyl, (CREON) 3000-9500 units CPEP Take 12,000 Units by mouth daily. 04/14/21   [provider]  PREMARIN vaginal cream every other day. 03/24/21   [provider]  thyroid (ARMOUR THYROID) 60 MG tablet Take 1 tablet by mouth daily. 07/30/21   [provider]  torsemide ([name]) 20 MG tablet TAKE 1 TABLET ONCE DAILY INTHE MORNING. MAY TAKE AN   EXTRA 1/2 TABLET AS        NEEDED 01/15/18   Trixie Dredge,  PA-C  valACYclovir (VALTREX) 500 MG tablet Take 500 mg by mouth daily as needed (Flair up).    [provider]      Allergies    Morphine, Penicillins, Tramadol, Acetaminophen, Amitriptyline, Codeine, Ibuprofen, Krill oil, Losartan, Propoxyphene, Shellfish allergy, Statins, Sulfamethoxazole, Fluconazole, and Norvasc [amlodipine besylate]    Review of Systems   Review of Systems  Constitutional:  Negative for fever.  Gastrointestinal:  Negative for  abdominal pain, nausea and vomiting.  Genitourinary:  Positive for dysuria. Negative for flank pain and hematuria.    Physical Exam Updated Vital Signs BP (!) 171/74 (BP Location: Right Arm)   Pulse 60   Temp 98 F (36.7 C) (Oral)   Resp 18   Ht '4\' 10"'$  (1.473 m)   Wt 47.6 kg   SpO2 100%   BMI 21.95 kg/m  Physical Exam  ED Results / Procedures / Treatments   Labs (all labs ordered are listed, but only abnormal results are displayed) Labs Reviewed  URINALYSIS, ROUTINE W REFLEX MICROSCOPIC - Abnormal; Notable for the following components:      Result Value   Protein, ur 100 (*)    All other components within normal limits  URINALYSIS, MICROSCOPIC (REFLEX) - Abnormal; Notable for the following components:   Bacteria, UA RARE (*)    All other components within normal limits  URINE CULTURE    EKG None  Radiology No results found.  Procedures Procedures    Medications Ordered in ED Medications - No data to display  ED Course/ Medical Decision Making/ A&P                           Medical Decision Making Amount and/or Complexity of Data Reviewed Labs: ordered.   This patient presents to the ED for concern of dysuria, this involves an extensive number of treatment options, and is a complaint that carries with it a high risk of complications and morbidity.  The differential diagnosis includes urinary tract infection, pyelonephritis, urethritis, and others   Co morbidities that complicate the patient evaluation  History of urinary tract infections, chronic kidney disease   Additional history obtained:  External records from outside source obtained and reviewed including ED notes from October 13 and telephone encounter notes from October 17 showing change in therapy with a continuation of Cipro with the addition of Keflex   Lab Tests:  I Ordered, and personally interpreted labs.  The pertinent results include: UA with 100 protein, rare bacteria, urine culture  in process     Test / Admission - Considered:  No sign of UTI at this time on urinalysis.  Urine culture is pending.  No flank pain no fevers, nausea/vomiting, no signs of pyelonephritis.  She may have urethritis due to the recent infection.  Patient will discharge home at this time.  She is unable to take medications for pain due to kidney disease and states that due to a glucose monitor she is unable to take Tylenol.  Recommend that she hydrate as she is able.  She will be contacted if the urine culture grows any organisms that needs treatment.        Final Clinical Impression(s) / ED Diagnoses Final diagnoses:  Dysuria    Rx / DC Orders ED Discharge Orders     None         Ronny Bacon 11/18/21 1706    Tegeler, Gwenyth Allegra, MD 11/18/21 2227

## 2021-11-19 LAB — URINE CULTURE: Culture: <10000 — AB

## 2021-12-08 ENCOUNTER — Inpatient Hospital Stay: Payer: Medicare Other | Attending: Hematology & Oncology

## 2021-12-08 ENCOUNTER — Inpatient Hospital Stay: Payer: Medicare Other

## 2021-12-08 ENCOUNTER — Encounter: Payer: Self-pay | Admitting: Hematology & Oncology

## 2021-12-08 ENCOUNTER — Inpatient Hospital Stay (HOSPITAL_BASED_OUTPATIENT_CLINIC_OR_DEPARTMENT_OTHER): Payer: Medicare Other | Admitting: Hematology & Oncology

## 2021-12-08 VITALS — BP 173/80 | HR 68 | Temp 98.2°F | Resp 20 | Ht <= 58 in | Wt 110.1 lb

## 2021-12-08 DIAGNOSIS — Z8673 Personal history of transient ischemic attack (TIA), and cerebral infarction without residual deficits: Secondary | ICD-10-CM | POA: Insufficient documentation

## 2021-12-08 DIAGNOSIS — D631 Anemia in chronic kidney disease: Secondary | ICD-10-CM

## 2021-12-08 DIAGNOSIS — D460 Refractory anemia without ring sideroblasts, so stated: Secondary | ICD-10-CM | POA: Insufficient documentation

## 2021-12-08 DIAGNOSIS — E1122 Type 2 diabetes mellitus with diabetic chronic kidney disease: Secondary | ICD-10-CM | POA: Insufficient documentation

## 2021-12-08 DIAGNOSIS — Z79899 Other long term (current) drug therapy: Secondary | ICD-10-CM | POA: Diagnosis not present

## 2021-12-08 DIAGNOSIS — R8271 Bacteriuria: Secondary | ICD-10-CM

## 2021-12-08 DIAGNOSIS — E1022 Type 1 diabetes mellitus with diabetic chronic kidney disease: Secondary | ICD-10-CM

## 2021-12-08 DIAGNOSIS — N183 Chronic kidney disease, stage 3 unspecified: Secondary | ICD-10-CM

## 2021-12-08 DIAGNOSIS — D638 Anemia in other chronic diseases classified elsewhere: Secondary | ICD-10-CM

## 2021-12-08 DIAGNOSIS — Z794 Long term (current) use of insulin: Secondary | ICD-10-CM | POA: Diagnosis not present

## 2021-12-08 DIAGNOSIS — N1831 Chronic kidney disease, stage 3a: Secondary | ICD-10-CM

## 2021-12-08 LAB — URINALYSIS, COMPLETE (UACMP) WITH MICROSCOPIC
Bilirubin Urine: NEGATIVE
Glucose, UA: NEGATIVE mg/dL
Hgb urine dipstick: NEGATIVE
Ketones, ur: NEGATIVE mg/dL
Leukocytes,Ua: NEGATIVE
Nitrite: NEGATIVE
Protein, ur: >=300 mg/dL — AB
Specific Gravity, Urine: 1.015 (ref 1.005–1.030)
pH: 7.5 (ref 5.0–8.0)

## 2021-12-08 LAB — CMP (CANCER CENTER ONLY)
ALT: 16 U/L (ref 0–44)
AST: 23 U/L (ref 15–41)
Albumin: 4 g/dL (ref 3.5–5.0)
Alkaline Phosphatase: 82 U/L (ref 38–126)
Anion gap: 9 (ref 5–15)
BUN: 42 mg/dL — ABNORMAL HIGH (ref 8–23)
CO2: 25 mmol/L (ref 22–32)
Calcium: 9 mg/dL (ref 8.9–10.3)
Chloride: 92 mmol/L — ABNORMAL LOW (ref 98–111)
Creatinine: 1.94 mg/dL — ABNORMAL HIGH (ref 0.44–1.00)
GFR, Estimated: 28 mL/min — ABNORMAL LOW (ref >60)
Glucose, Bld: 173 mg/dL — ABNORMAL HIGH (ref 70–99)
Potassium: 4.3 mmol/L (ref 3.5–5.1)
Sodium: 126 mmol/L — ABNORMAL LOW (ref 135–145)
Total Bilirubin: 0.4 mg/dL (ref 0.3–1.2)
Total Protein: 6.6 g/dL (ref 6.5–8.1)

## 2021-12-08 LAB — CBC WITH DIFFERENTIAL (CANCER CENTER ONLY)
Abs Immature Granulocytes: 0.03 10*3/uL (ref 0.00–0.07)
Basophils Absolute: 0.1 10*3/uL (ref 0.0–0.1)
Basophils Relative: 1 %
Eosinophils Absolute: 0.9 10*3/uL — ABNORMAL HIGH (ref 0.0–0.5)
Eosinophils Relative: 12 %
HCT: 24.2 % — ABNORMAL LOW (ref 36.0–46.0)
Hemoglobin: 7.9 g/dL — ABNORMAL LOW (ref 12.0–15.0)
Immature Granulocytes: 0 %
Lymphocytes Relative: 18 %
Lymphs Abs: 1.4 10*3/uL (ref 0.7–4.0)
MCH: 26.7 pg (ref 26.0–34.0)
MCHC: 32.6 g/dL (ref 30.0–36.0)
MCV: 81.8 fL (ref 80.0–100.0)
Monocytes Absolute: 0.8 10*3/uL (ref 0.1–1.0)
Monocytes Relative: 11 %
Neutro Abs: 4.3 10*3/uL (ref 1.7–7.7)
Neutrophils Relative %: 58 %
Platelet Count: 285 10*3/uL (ref 150–400)
RBC: 2.96 MIL/uL — ABNORMAL LOW (ref 3.87–5.11)
RDW: 17.2 % — ABNORMAL HIGH (ref 11.5–15.5)
WBC Count: 7.4 10*3/uL (ref 4.0–10.5)
nRBC: 0 % (ref 0.0–0.2)

## 2021-12-08 LAB — RETICULOCYTES
Immature Retic Fract: 2.9 % (ref 2.3–15.9)
RBC.: 2.95 MIL/uL — ABNORMAL LOW (ref 3.87–5.11)
Retic Count, Absolute: 31 10*3/uL (ref 19.0–186.0)
Retic Ct Pct: 1.1 % (ref 0.4–3.1)

## 2021-12-08 LAB — FERRITIN: Ferritin: 139 ng/mL (ref 11–307)

## 2021-12-08 MED ORDER — DARBEPOETIN ALFA 300 MCG/0.6ML IJ SOSY
300.0000 ug | PREFILLED_SYRINGE | Freq: Once | INTRAMUSCULAR | Status: AC
  Administered 2021-12-08: 300 ug via SUBCUTANEOUS
  Filled 2021-12-08: qty 0.6

## 2021-12-08 NOTE — Progress Notes (Addendum)
Hematology and Oncology Follow Up Visit  Brittney Pace [id number] 30-Nov-1955 66 y.o. 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  Unfortunately, her hemoglobin has dropped quite a bit since we last saw her.  She probably has not had Aranesp for a while.  She last had Aranesp back in August.  Is been 6 weeks since we last saw her.  Her hemoglobin is dropped down to 7.9.  When we last saw her back in late September, her ferritin was 44 with an iron saturation of 25%.  She had a urinary tract infection about a month ago.  She had both Klebsiella and Enterococcus in her urine.  We are going  to have to check her urine again I think.  She says that her diarrhea is doing a little bit better.  She is taking Metamucil which does seem to help this.  She still has a diabetes.  She is trying to maintain this is well possible.  This morning, her glucose is 173.  Her blood pressure still on the higher side.  We just have to be careful we will give the Aranesp.  She has had little bit of leg swelling.  This happens when she does not take her diuretic.  She has had no fever.  She has had no cough.  She has had no bleeding.  Overall, I would say performance status is probably ECOG 1-2.      Medications:  Allergies as of 12/08/2021       Reactions   Morphine Anaphylaxis, Shortness Of Breath   Penicillins Hives   Tolerated ANCEF on 04/30/20 Has patient had a PCN reaction causing immediate rash, facial/tongue/throat swelling, SOB or lightheadedness with hypotension: Yes Has patient had a PCN reaction causing severe rash involving mucus membranes or skin necrosis: No Has patient had a PCN reaction that required hospitalization No Has patient had a PCN reaction  occurring within the last 10 years: No If all of the above answers are "NO", then may proceed with Cephalosporin use.   Tramadol Anaphylaxis   Acetaminophen Other (See Comments)   Alters insulin pump readings   Amitriptyline Other (See Comments)   Severe headache/ out of body feeling   Codeine Other (See Comments)   Severe headaches/ out of body feeling   Ibuprofen Other (See Comments)   Messes up CGM reading on glucose monitor   Krill Oil Diarrhea, Itching, Nausea And Vomiting   Losartan Cough   Propoxyphene Other (See Comments)   Severe headaches / out of body feeling   Shellfish Allergy Diarrhea, Nausea And Vomiting   Statins Other (See Comments)   Muscle pains Other reaction(s): Other   Sulfamethoxazole Other (See Comments)   Mouth ulcers   Fluconazole    Other reaction(s): Contact Dermatitis (intolerance)   Norvasc [amlodipine Besylate] Swelling        Medication List        Accurate as of December 08, 2021 12:15 PM. If you have any questions, ask your nurse or doctor.          Accu-Chek Guide test strip Generic drug: glucose blood 3 (three) times daily.   AMBULATORY NON FORMULARY MEDICATION Incontinence pads per patient preference, #unlimited, refill x99 Fax to [phone]  Armour Thyroid 60 MG tablet Generic drug: thyroid Take 1 tablet by mouth daily.   aspirin EC 325 MG tablet Take 1 tablet (325 mg total) by mouth daily. What changed: when to take this   carvedilol 12.5 MG tablet Commonly known as: COREG Take 1 tablet (12.5 mg total) by mouth 2 (two) times daily with a meal.   cetirizine 10 MG tablet Commonly known as: ZYRTEC Take 10 mg by mouth daily.   ciprofloxacin 500 MG tablet Commonly known as: CIPRO Take 1 tablet (500 mg total) by mouth every 12 (twelve) hours.   Creon 3000-9500 units Cpep Generic drug: Pancrelipase (Lip-Prot-Amyl) Take 12,000 Units by mouth daily.   Dexcom G6 Sensor Misc USE TO CONTINUOUSLY MONITOR BLOOD SUGARS.  CHANGE EVERY 10 DAYS   diphenoxylate-atropine 2.5-0.025 MG tablet Commonly known as: LOMOTIL TAKE 1 TABLET BY MOUTH 4 TIMES DAILY AS NEEDED FOR DIARRHEA OR  LOOSE  STOOLS   esomeprazole 20 MG capsule Commonly known as: NEXIUM Take 20 mg by mouth every morning.   insulin pump Soln Inject into the skin continuous. Novolog insulin   ketoconazole 2 % shampoo Commonly known as: NIZORAL SMARTSIG:Topical 2-3 Times Weekly   lisinopril 10 MG tablet Commonly known as: ZESTRIL Take 10 mg by mouth daily.   mirtazapine 30 MG tablet Commonly known as: REMERON Take 30 mg by mouth at bedtime.   mometasone 0.1 % lotion Commonly known as: ELOCON daily as needed.   NovoLOG 100 UNIT/ML injection Generic drug: insulin aspart Inject 40 Units into the skin daily.   OVER THE COUNTER MEDICATION daily. Magnesium L-Treonate   Premarin vaginal cream Generic drug: conjugated estrogens every other day.   torsemide 20 MG tablet Commonly known as: [name] TAKE 1 TABLET ONCE DAILY INTHE MORNING. MAY TAKE AN   EXTRA 1/2 TABLET AS        NEEDED   valACYclovir 500 MG tablet Commonly known as: VALTREX Take 500 mg by mouth daily as needed (Flair up).        Allergies:  Allergies  Allergen Reactions   Morphine Anaphylaxis and Shortness Of Breath   Penicillins Hives    Tolerated ANCEF on 04/30/20 Has patient had a PCN reaction causing immediate rash, facial/tongue/throat swelling, SOB or lightheadedness with hypotension: Yes Has patient had a PCN reaction causing severe rash involving mucus membranes or skin necrosis: No Has patient had a PCN reaction that required hospitalization No Has patient had a PCN reaction occurring within the last 10 years: No If all of the above answers are "NO", then may proceed with Cephalosporin use.   Tramadol Anaphylaxis   Acetaminophen Other (See Comments)    Alters insulin pump readings    Amitriptyline Other (See Comments)    Severe headache/ out of body  feeling   Codeine Other (See Comments)    Severe headaches/ out of body feeling    Ibuprofen Other (See Comments)    Messes up CGM reading on glucose monitor     Krill Oil Diarrhea, Itching and Nausea And Vomiting   Losartan Cough   Propoxyphene Other (See Comments)    Severe headaches / out of body feeling    Shellfish Allergy Diarrhea and Nausea And Vomiting   Statins Other (See Comments)    Muscle pains Other reaction(s): Other   Sulfamethoxazole Other (See Comments)    Mouth ulcers    Fluconazole     Other reaction(s): Contact Dermatitis (intolerance)   Norvasc [Amlodipine Besylate] Swelling    Past Medical History,  Surgical history, Social history, and Family History were reviewed and updated.  Review of Systems: Review of Systems  Constitutional:  Positive for malaise/fatigue.  HENT: Negative.    Eyes: Negative.   Respiratory: Negative.    Cardiovascular:  Positive for leg swelling.  Gastrointestinal:  Positive for abdominal pain.  Genitourinary: Negative.   Musculoskeletal:  Positive for joint pain and myalgias.  Skin:  Positive for rash.  Neurological: Negative.   Endo/Heme/Allergies: Negative.   Psychiatric/Behavioral: Negative.       Physical Exam:   Wt Readings from Last 3 Encounters:  11/18/21 105 lb (47.6 kg)  11/05/21 105 lb (47.6 kg)  10/22/21 105 lb 1.3 oz (47.7 kg)  Vital signs show temperature 98.2.  Pulse 68.  Blood pressure 173/80.  Weight is 110 pounds.  Physical Exam Vitals reviewed.  HENT:     Head: Normocephalic and atraumatic.  Eyes:     Pupils: Pupils are equal, round, and reactive to light.  Cardiovascular:     Rate and Rhythm: Normal rate and regular rhythm.     Heart sounds: Normal heart sounds.  Pulmonary:     Effort: Pulmonary effort is normal.     Breath sounds: Normal breath sounds.  Abdominal:     General: Bowel sounds are normal.     Palpations: Abdomen is soft.  Musculoskeletal:        General: No tenderness or  deformity. Normal range of motion.     Cervical back: Normal range of motion.     Comments: On her extremities, there is some edema in her legs.  She has about 2+ edema.  She has a macular type reddish rash in the lower extremities.  This is in the areas above her ankles.  There is a couple areas that might be a little bit open.  Lymphadenopathy:     Cervical: No cervical adenopathy.  Skin:    General: Skin is warm and dry.     Findings: No erythema or rash.  Neurological:     Mental Status: She is alert and oriented to person, place, and time.  Psychiatric:        Behavior: Behavior normal.        Thought Content: Thought content normal.        Judgment: Judgment normal.     Lab Results  Component Value Date   WBC 7.4 12/08/2021   HGB 7.9 (L) 12/08/2021   HCT 24.2 (L) 12/08/2021   MCV 81.8 12/08/2021   PLT 285 12/08/2021   Lab Results  Component Value Date   FERRITIN 44 10/22/2021   IRON 91 10/22/2021   TIBC 361 10/22/2021   UIBC 270 10/22/2021   IRONPCTSAT 25 10/22/2021   Lab Results  Component Value Date   RETICCTPCT 1.1 12/08/2021   RBC 2.96 (L) 12/08/2021   RBC 2.95 (L) 12/08/2021   No results found for: "KPAFRELGTCHN", "LAMBDASER", "KAPLAMBRATIO" No results found for: "IGGSERUM", "IGA", "IGMSERUM" No results found for: "TOTALPROTELP", "ALBUMINELP", "A1GS", "A2GS", "BETS", "BETA2SER", "GAMS", "MSPIKE", "SPEI"   Chemistry      Component Value Date/Time   NA 126 (L) 12/08/2021 1133   NA 138 07/17/2017 1128   K 4.3 12/08/2021 1133   CL 92 (L) 12/08/2021 1133   CO2 25 12/08/2021 1133   BUN 42 (H) 12/08/2021 1133   BUN 23 07/17/2017 1128   CREATININE 1.94 (H) 12/08/2021 1133   CREATININE 0.99 07/20/2018 0821      Component Value Date/Time   CALCIUM 9.0 12/08/2021  1133   ALKPHOS 82 12/08/2021 1133   AST 23 12/08/2021 1133   ALT 16 12/08/2021 1133   BILITOT 0.4 12/08/2021 1133       Impression and Plan: Ms. Both is a very pleasant 66 yo female with  multifactorial anemia.  I am surprised that her hemoglobin dropped so much.  I suppose that if she had a urinary tract infection, this could drop the hemoglobin.  She has chronic renal insufficiency.  We will give her Aranesp.  I think going to have to give her Aranesp despite the elevated blood pressure.  I told her to make sure she takes her diuretic when she gets home.  We will see what her iron studies look like.  Her MCV is on the lower side.  We will check her urinalysis.  We will see if she still has a urinary tract infection.  There is a lack going on with Ms. Eckersley.  We will get have to get her back in 4 weeks.    [name], MD 11/15/202312:15 PM

## 2021-12-08 NOTE — Patient Instructions (Signed)

## 2021-12-09 LAB — IRON AND IRON BINDING CAPACITY (CC-WL,HP ONLY)
Iron: 77 ug/dL (ref 28–170)
Saturation Ratios: 24 % (ref 10.4–31.8)
TIBC: 316 ug/dL (ref 250–450)
UIBC: 239 ug/dL (ref 148–442)

## 2021-12-10 LAB — URINE CULTURE

## 2022-01-01 ENCOUNTER — Emergency Department (HOSPITAL_BASED_OUTPATIENT_CLINIC_OR_DEPARTMENT_OTHER)
Admission: EM | Admit: 2022-01-01 | Discharge: 2022-01-01 | Discharge status: Against Medical Advice | Payer: Medicare Other | Attending: Emergency Medicine | Admitting: Emergency Medicine

## 2022-01-01 ENCOUNTER — Encounter (HOSPITAL_BASED_OUTPATIENT_CLINIC_OR_DEPARTMENT_OTHER): Payer: Self-pay | Admitting: Emergency Medicine

## 2022-01-01 ENCOUNTER — Other Ambulatory Visit: Payer: Self-pay

## 2022-01-01 DIAGNOSIS — Z5321 Procedure and treatment not carried out due to patient leaving prior to being seen by health care provider: Secondary | ICD-10-CM | POA: Diagnosis not present

## 2022-01-01 DIAGNOSIS — R300 Dysuria: Secondary | ICD-10-CM | POA: Insufficient documentation

## 2022-01-01 LAB — URINALYSIS, ROUTINE W REFLEX MICROSCOPIC
Bilirubin Urine: NEGATIVE
Glucose, UA: NEGATIVE mg/dL
Ketones, ur: NEGATIVE mg/dL
Nitrite: NEGATIVE
Protein, ur: 100 mg/dL — AB
Specific Gravity, Urine: 1.01 (ref 1.005–1.030)
pH: 6 (ref 5.0–8.0)

## 2022-01-01 LAB — URINALYSIS, MICROSCOPIC (REFLEX)

## 2022-01-01 NOTE — ED Notes (Signed)
Called in [place], x 1 no answer

## 2022-01-01 NOTE — ED Triage Notes (Signed)
Pt presents to ED POV. Pt c/o burning w/ urination, and malordorous x1w. Pt has hx of recurrent UTI.

## 2022-01-01 NOTE — ED Notes (Signed)
Called x 2 in [place], no answer

## 2022-01-04 ENCOUNTER — Other Ambulatory Visit: Payer: Self-pay | Admitting: *Deleted

## 2022-01-04 ENCOUNTER — Telehealth: Payer: Self-pay | Admitting: *Deleted

## 2022-01-04 DIAGNOSIS — E039 Hypothyroidism, unspecified: Secondary | ICD-10-CM

## 2022-01-04 DIAGNOSIS — N183 Chronic kidney disease, stage 3 unspecified: Secondary | ICD-10-CM

## 2022-01-04 DIAGNOSIS — R590 Localized enlarged lymph nodes: Secondary | ICD-10-CM

## 2022-01-04 DIAGNOSIS — N1831 Chronic kidney disease, stage 3a: Secondary | ICD-10-CM

## 2022-01-04 DIAGNOSIS — R8271 Bacteriuria: Secondary | ICD-10-CM

## 2022-01-04 DIAGNOSIS — D638 Anemia in other chronic diseases classified elsewhere: Secondary | ICD-10-CM

## 2022-01-04 DIAGNOSIS — R718 Other abnormality of red blood cells: Secondary | ICD-10-CM

## 2022-01-04 DIAGNOSIS — D631 Anemia in chronic kidney disease: Secondary | ICD-10-CM

## 2022-01-04 MED ORDER — CIPROFLOXACIN HCL 500 MG PO TABS: 500.0000 mg | ORAL_TABLET | Freq: Two times a day (BID) | ORAL | 0 refills | Status: DC

## 2022-01-04 NOTE — Telephone Encounter (Signed)
Message received from patient wanting to confirm her appt with Dr Marin Olp on Thursday and to also inform Dr Marin Olp of her urine results from the ER on 01/01/22 which she did not wait for the results of.  Call placed back to patient and appts confirmed for this Thursday and pt notified that Dr Marin Olp has reviewed her urinalysis from the ER and would like for her to take Cipro 500 mg BID for three days and that urine would be rechecked on Thursday.  Pt is appreciative of call and has no further questions at this time.  Prescription for Cipro sent.

## 2022-01-06 ENCOUNTER — Inpatient Hospital Stay: Payer: Medicare Other | Attending: Hematology & Oncology

## 2022-01-06 ENCOUNTER — Inpatient Hospital Stay: Payer: Medicare Other

## 2022-01-06 ENCOUNTER — Encounter: Payer: Self-pay | Admitting: Hematology & Oncology

## 2022-01-06 ENCOUNTER — Inpatient Hospital Stay (HOSPITAL_BASED_OUTPATIENT_CLINIC_OR_DEPARTMENT_OTHER): Payer: Medicare Other | Admitting: Hematology & Oncology

## 2022-01-06 ENCOUNTER — Other Ambulatory Visit: Payer: Self-pay

## 2022-01-06 ENCOUNTER — Encounter: Payer: Self-pay | Admitting: *Deleted

## 2022-01-06 VITALS — BP 175/83 | HR 62 | Temp 97.7°F | Resp 19 | Ht <= 58 in | Wt 106.1 lb

## 2022-01-06 DIAGNOSIS — E1022 Type 1 diabetes mellitus with diabetic chronic kidney disease: Secondary | ICD-10-CM

## 2022-01-06 DIAGNOSIS — D638 Anemia in other chronic diseases classified elsewhere: Secondary | ICD-10-CM

## 2022-01-06 DIAGNOSIS — E1122 Type 2 diabetes mellitus with diabetic chronic kidney disease: Secondary | ICD-10-CM | POA: Insufficient documentation

## 2022-01-06 DIAGNOSIS — Z79899 Other long term (current) drug therapy: Secondary | ICD-10-CM | POA: Diagnosis not present

## 2022-01-06 DIAGNOSIS — Z8673 Personal history of transient ischemic attack (TIA), and cerebral infarction without residual deficits: Secondary | ICD-10-CM | POA: Insufficient documentation

## 2022-01-06 DIAGNOSIS — R718 Other abnormality of red blood cells: Secondary | ICD-10-CM

## 2022-01-06 DIAGNOSIS — D631 Anemia in chronic kidney disease: Secondary | ICD-10-CM

## 2022-01-06 DIAGNOSIS — N1831 Chronic kidney disease, stage 3a: Secondary | ICD-10-CM

## 2022-01-06 DIAGNOSIS — D460 Refractory anemia without ring sideroblasts, so stated: Secondary | ICD-10-CM | POA: Diagnosis present

## 2022-01-06 DIAGNOSIS — R8271 Bacteriuria: Secondary | ICD-10-CM

## 2022-01-06 DIAGNOSIS — Z794 Long term (current) use of insulin: Secondary | ICD-10-CM | POA: Diagnosis not present

## 2022-01-06 DIAGNOSIS — R590 Localized enlarged lymph nodes: Secondary | ICD-10-CM

## 2022-01-06 DIAGNOSIS — E039 Hypothyroidism, unspecified: Secondary | ICD-10-CM

## 2022-01-06 LAB — CMP (CANCER CENTER ONLY)
ALT: 13 U/L (ref 0–44)
AST: 22 U/L (ref 15–41)
Albumin: 4 g/dL (ref 3.5–5.0)
Alkaline Phosphatase: 89 U/L (ref 38–126)
Anion gap: 8 (ref 5–15)
BUN: 45 mg/dL — ABNORMAL HIGH (ref 8–23)
CO2: 26 mmol/L (ref 22–32)
Calcium: 9.2 mg/dL (ref 8.9–10.3)
Chloride: 94 mmol/L — ABNORMAL LOW (ref 98–111)
Creatinine: 2.26 mg/dL — ABNORMAL HIGH (ref 0.44–1.00)
GFR, Estimated: 23 mL/min — ABNORMAL LOW (ref >60)
Glucose, Bld: 253 mg/dL — ABNORMAL HIGH (ref 70–99)
Potassium: 5.5 mmol/L — ABNORMAL HIGH (ref 3.5–5.1)
Sodium: 128 mmol/L — ABNORMAL LOW (ref 135–145)
Total Bilirubin: 0.4 mg/dL (ref 0.3–1.2)
Total Protein: 6.7 g/dL (ref 6.5–8.1)

## 2022-01-06 LAB — CBC WITH DIFFERENTIAL (CANCER CENTER ONLY)
Abs Immature Granulocytes: 0.07 10*3/uL (ref 0.00–0.07)
Basophils Absolute: 0.1 10*3/uL (ref 0.0–0.1)
Basophils Relative: 1 %
Eosinophils Absolute: 0.9 10*3/uL — ABNORMAL HIGH (ref 0.0–0.5)
Eosinophils Relative: 9 %
HCT: 32.6 % — ABNORMAL LOW (ref 36.0–46.0)
Hemoglobin: 10.4 g/dL — ABNORMAL LOW (ref 12.0–15.0)
Immature Granulocytes: 1 %
Lymphocytes Relative: 11 %
Lymphs Abs: 1.1 10*3/uL (ref 0.7–4.0)
MCH: 28.2 pg (ref 26.0–34.0)
MCHC: 31.9 g/dL (ref 30.0–36.0)
MCV: 88.3 fL (ref 80.0–100.0)
Monocytes Absolute: 0.8 10*3/uL (ref 0.1–1.0)
Monocytes Relative: 8 %
Neutro Abs: 7.1 10*3/uL (ref 1.7–7.7)
Neutrophils Relative %: 70 %
Platelet Count: 356 10*3/uL (ref 150–400)
RBC: 3.69 MIL/uL — ABNORMAL LOW (ref 3.87–5.11)
RDW: 15.2 % (ref 11.5–15.5)
WBC Count: 10 10*3/uL (ref 4.0–10.5)
nRBC: 0 % (ref 0.0–0.2)

## 2022-01-06 LAB — RETICULOCYTES
Immature Retic Fract: 5 % (ref 2.3–15.9)
RBC.: 3.64 MIL/uL — ABNORMAL LOW (ref 3.87–5.11)
Retic Count, Absolute: 34.2 10*3/uL (ref 19.0–186.0)
Retic Ct Pct: 0.9 % (ref 0.4–3.1)

## 2022-01-06 LAB — IRON AND IRON BINDING CAPACITY (CC-WL,HP ONLY)
Iron: 100 ug/dL (ref 28–170)
Saturation Ratios: 32 % — ABNORMAL HIGH (ref 10.4–31.8)
TIBC: 318 ug/dL (ref 250–450)
UIBC: 218 ug/dL (ref 148–442)

## 2022-01-06 LAB — FERRITIN: Ferritin: 68 ng/mL (ref 11–307)

## 2022-01-06 MED ORDER — DARBEPOETIN ALFA 300 MCG/0.6ML IJ SOSY
300.0000 ug | PREFILLED_SYRINGE | Freq: Once | INTRAMUSCULAR | Status: AC
  Administered 2022-01-06: 300 ug via SUBCUTANEOUS
  Filled 2022-01-06: qty 0.6

## 2022-01-06 NOTE — Patient Instructions (Signed)

## 2022-01-06 NOTE — Progress Notes (Signed)
Hematology and Oncology Follow Up Visit  Brittney Pace [id number] Dec 25, 1955 66 y.o. 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  As always, she is having a little bit of difficulty.  She has been having some problems with urinary tract infections.  I think she said that she had her urine checked yesterday by her family doctor.  She is not sure whether he cells are.  As always, her blood sugars quite high.  I am not sure as to why this cannot get under better control.  I think she is little bit dehydrated.  She has a low sodium and low chloride.  Potassium level is up.  I told her that she really needs to hydrate well.  I offered her to have hydration in the office.  She says she can do this at home.  She will need to have Aranesp today.  Her hemoglobin is a little bit better which is nice to see.  Her iron studies showed a ferritin 139 with an iron saturation of 24% last month.  She has had no fever.  Her appetite is doing okay.  She really has to watch what she eats because of the diabetes.  She has had no bleeding.  There is been no nausea or vomiting.  As always, her blood pressure is on the high side.  She says that she can control this at home.  Overall, I would have said that her performance status is probably ECOG 1.   Medications:  Allergies as of 01/06/2022       Reactions   Morphine Anaphylaxis, Shortness Of Breath   Penicillins Hives   Tolerated ANCEF on 04/30/20 Has patient had a PCN reaction causing immediate rash, facial/tongue/throat swelling, SOB or lightheadedness with hypotension: Yes Has patient had a PCN reaction causing severe rash involving mucus membranes or skin necrosis: No Has patient had a PCN reaction that required  hospitalization No Has patient had a PCN reaction occurring within the last 10 years: No If all of the above answers are "NO", then may proceed with Cephalosporin use.   Tramadol Anaphylaxis   Acetaminophen Other (See Comments)   Alters insulin pump readings   Amitriptyline Other (See Comments)   Severe headache/ out of body feeling   Codeine Other (See Comments)   Severe headaches/ out of body feeling   Ibuprofen Other (See Comments)   Messes up CGM reading on glucose monitor   Krill Oil Diarrhea, Itching, Nausea And Vomiting   Losartan Cough   Propoxyphene Other (See Comments)   Severe headaches / out of body feeling   Shellfish Allergy Diarrhea, Nausea And Vomiting   Statins Other (See Comments)   Muscle pains Other reaction(s): Other   Sulfamethoxazole Other (See Comments)   Mouth ulcers   Fluconazole    Other reaction(s): Contact Dermatitis (intolerance)   Norvasc [amlodipine Besylate] Swelling        Medication List        Accurate as of January 06, 2022 12:31 PM. If you have any questions, ask your nurse or doctor.          Accu-Chek Guide test strip Generic drug: glucose blood 3 (three) times daily.   AMBULATORY NON FORMULARY MEDICATION Incontinence pads per patient  preference, #unlimited, refill x99 Fax to [phone]   Armour Thyroid 60 MG tablet Generic drug: thyroid Take 1 tablet by mouth daily.   aspirin EC 325 MG tablet Take 1 tablet (325 mg total) by mouth daily. What changed: when to take this   carvedilol 12.5 MG tablet Commonly known as: COREG Take 1 tablet (12.5 mg total) by mouth 2 (two) times daily with a meal.   cetirizine 10 MG tablet Commonly known as: ZYRTEC Take 10 mg by mouth daily.   ciprofloxacin 500 MG tablet Commonly known as: Cipro Take 1 tablet (500 mg total) by mouth 2 (two) times daily.   Creon 3000-9500 units Cpep Generic drug: Pancrelipase (Lip-Prot-Amyl) Take 12,000 Units by mouth daily.   Dexcom G6 Sensor  Misc USE TO CONTINUOUSLY MONITOR BLOOD SUGARS. CHANGE EVERY 10 DAYS   diphenoxylate-atropine 2.5-0.025 MG tablet Commonly known as: LOMOTIL TAKE 1 TABLET BY MOUTH 4 TIMES DAILY AS NEEDED FOR DIARRHEA OR  LOOSE  STOOLS   esomeprazole 20 MG capsule Commonly known as: NEXIUM Take 20 mg by mouth every morning.   insulin pump Soln Inject into the skin continuous. Novolog insulin   ketoconazole 2 % shampoo Commonly known as: NIZORAL SMARTSIG:Topical 2-3 Times Weekly   lamoTRIgine 25 MG tablet Commonly known as: LAMICTAL Take by mouth.   lisinopril 10 MG tablet Commonly known as: ZESTRIL Take 10 mg by mouth daily.   Melatonin Gummies 2.5 MG Chew Chew by mouth at bedtime as needed.   mirtazapine 30 MG tablet Commonly known as: REMERON Take 30 mg by mouth at bedtime.   mometasone 0.1 % lotion Commonly known as: ELOCON daily as needed.   NovoLOG 100 UNIT/ML injection Generic drug: insulin aspart Inject 40 Units into the skin daily.   OVER THE COUNTER MEDICATION daily. Magnesium L-Treonate   Premarin vaginal cream Generic drug: conjugated estrogens every other day.   torsemide 20 MG tablet Commonly known as: [name] TAKE 1 TABLET ONCE DAILY INTHE MORNING. MAY TAKE AN   EXTRA 1/2 TABLET AS        NEEDED   valACYclovir 500 MG tablet Commonly known as: VALTREX Take 500 mg by mouth daily as needed (Flair up).        Allergies:  Allergies  Allergen Reactions   Morphine Anaphylaxis and Shortness Of Breath   Penicillins Hives    Tolerated ANCEF on 04/30/20 Has patient had a PCN reaction causing immediate rash, facial/tongue/throat swelling, SOB or lightheadedness with hypotension: Yes Has patient had a PCN reaction causing severe rash involving mucus membranes or skin necrosis: No Has patient had a PCN reaction that required hospitalization No Has patient had a PCN reaction occurring within the last 10 years: No If all of the above answers are "NO", then may proceed  with Cephalosporin use.   Tramadol Anaphylaxis   Acetaminophen Other (See Comments)    Alters insulin pump readings    Amitriptyline Other (See Comments)    Severe headache/ out of body feeling   Codeine Other (See Comments)    Severe headaches/ out of body feeling    Ibuprofen Other (See Comments)    Messes up CGM reading on glucose monitor     Krill Oil Diarrhea, Itching and Nausea And Vomiting   Losartan Cough   Propoxyphene Other (See Comments)    Severe headaches / out of body feeling    Shellfish Allergy Diarrhea and Nausea And Vomiting   Statins Other (See Comments)    Muscle pains Other reaction(s): Other  Sulfamethoxazole Other (See Comments)    Mouth ulcers    Fluconazole     Other reaction(s): Contact Dermatitis (intolerance)   Norvasc [Amlodipine Besylate] Swelling    Past Medical History, Surgical history, Social history, and Family History were reviewed and updated.  Review of Systems: Review of Systems  Constitutional:  Positive for malaise/fatigue.  HENT: Negative.    Eyes: Negative.   Respiratory: Negative.    Cardiovascular:  Positive for leg swelling.  Gastrointestinal:  Positive for abdominal pain.  Genitourinary: Negative.   Musculoskeletal:  Positive for joint pain and myalgias.  Skin:  Positive for rash.  Neurological: Negative.   Endo/Heme/Allergies: Negative.   Psychiatric/Behavioral: Negative.       Physical Exam:   Wt Readings from Last 3 Encounters:  01/06/22 106 lb 1.9 oz (48.1 kg)  01/01/22 110 lb (49.9 kg)  12/08/21 110 lb 1.9 oz (50 kg)  Vital signs show temperature 98.2.  Pulse 68.  Blood pressure 173/80.  Weight is 110 pounds.  Physical Exam Vitals reviewed.  HENT:     Head: Normocephalic and atraumatic.  Eyes:     Pupils: Pupils are equal, round, and reactive to light.  Cardiovascular:     Rate and Rhythm: Normal rate and regular rhythm.     Heart sounds: Normal heart sounds.  Pulmonary:     Effort: Pulmonary  effort is normal.     Breath sounds: Normal breath sounds.  Abdominal:     General: Bowel sounds are normal.     Palpations: Abdomen is soft.  Musculoskeletal:        General: No tenderness or deformity. Normal range of motion.     Cervical back: Normal range of motion.     Comments: On her extremities, there is some edema in her legs.  She has about 2+ edema.  She has a macular type reddish rash in the lower extremities.  This is in the areas above her ankles.  There is a couple areas that might be a little bit open.  Lymphadenopathy:     Cervical: No cervical adenopathy.  Skin:    General: Skin is warm and dry.     Findings: No erythema or rash.  Neurological:     Mental Status: She is alert and oriented to person, place, and time.  Psychiatric:        Behavior: Behavior normal.        Thought Content: Thought content normal.        Judgment: Judgment normal.    Lab Results  Component Value Date   WBC 10.0 01/06/2022   HGB 10.4 (L) 01/06/2022   HCT 32.6 (L) 01/06/2022   MCV 88.3 01/06/2022   PLT 356 01/06/2022   Lab Results  Component Value Date   FERRITIN 139 12/08/2021   IRON 77 12/08/2021   TIBC 316 12/08/2021   UIBC 239 12/08/2021   IRONPCTSAT 24 12/08/2021   Lab Results  Component Value Date   RETICCTPCT 0.9 01/06/2022   RBC 3.64 (L) 01/06/2022   No results found for: "KPAFRELGTCHN", "LAMBDASER", "KAPLAMBRATIO" No results found for: "IGGSERUM", "IGA", "IGMSERUM" No results found for: "TOTALPROTELP", "ALBUMINELP", "A1GS", "A2GS", "BETS", "BETA2SER", "GAMS", "MSPIKE", "SPEI"   Chemistry      Component Value Date/Time   NA 128 (L) 01/06/2022 1139   NA 138 07/17/2017 1128   K 5.5 (H) 01/06/2022 1139   CL 94 (L) 01/06/2022 1139   CO2 26 01/06/2022 1139   BUN 45 (H) 01/06/2022 1139  BUN 23 07/17/2017 1128   CREATININE 2.26 (H) 01/06/2022 1139   CREATININE 0.99 07/20/2018 0821      Component Value Date/Time   CALCIUM 9.2 01/06/2022 1139   ALKPHOS 89  01/06/2022 1139   AST 22 01/06/2022 1139   ALT 13 01/06/2022 1139   BILITOT 0.4 01/06/2022 1139       Impression and Plan: Ms. Dawn is a very pleasant 66 yo female with multifactorial anemia.  I am happy that her hemoglobin is better.  She still needs to have Aranesp.  Again, her sodium and chloride are little bit low.  She says that she we will be able to hydrate at home.  Her blood sugars are quite high.  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  We will plan to get her back to see [place] in early January.  I really hope that next year will be a much better year for her and a quite year for her.   [name], MD 12/14/202312:31 PM

## 2022-01-25 NOTE — Progress Notes (Signed)
HPI: FU CAD. Patient previously followed at [place]; H/O CABG (LIMA to LAD; SVG to RCA); According to the patient, she had 2 stents to the LIMA graft and the LAD in 2000. She's had EECP treatments in [place]. Her last heart catheterization was August 2015 at [facility] which revealed two-vessel coronary disease with a patent LIMA to the LAD and SVG to the RCA. No obstructive disease in left circumflex; EF 65. Last echo January 2020 showed normal LV function, systolic bowing of mitral valve without frank prolapse. Also with history of stroke. Since last seen, she has dyspnea with more vigorous activities but not routine activities.  No orthopnea, PND, pedal edema, chest pain or syncope.  She does complain of a cough since September.  Current Outpatient Medications  Medication Sig Dispense Refill   ACCU-CHEK GUIDE test strip 3 (three) times daily.     AMBULATORY NON FORMULARY MEDICATION Incontinence pads per patient preference, #unlimited, refill x99 Fax to [phone] 1 Units prn   aspirin EC 325 MG tablet Take 1 tablet (325 mg total) by mouth daily. 30 tablet 0   carvedilol (COREG) 12.5 MG tablet Take 1 tablet (12.5 mg total) by mouth 2 (two) times daily with a meal. 180 tablet 3   cetirizine (ZYRTEC) 10 MG tablet Take 10 mg by mouth daily.     Continuous Blood Gluc Sensor (DEXCOM G6 SENSOR) MISC USE TO CONTINUOUSLY MONITOR BLOOD SUGARS. CHANGE EVERY 10 DAYS     diphenoxylate-atropine (LOMOTIL) 2.5-0.025 MG tablet TAKE 1 TABLET BY MOUTH 4 TIMES DAILY AS NEEDED FOR DIARRHEA OR  LOOSE  STOOLS 45 tablet 0   esomeprazole (NEXIUM) 20 MG capsule Take 20 mg by mouth every morning.      Insulin Human (INSULIN PUMP) SOLN Inject into the skin continuous. Novolog insulin     ketoconazole (NIZORAL) 2 % shampoo SMARTSIG:Topical 2-3 Times Weekly     lamoTRIgine (LAMICTAL) 150 MG tablet Take 1 tablet by mouth daily.     lisinopril (ZESTRIL) 20 MG tablet Take 20 mg by mouth  daily.     Melatonin Gummies 2.5 MG CHEW Chew by mouth at bedtime as needed.     mirtazapine (REMERON) 30 MG tablet Take 30 mg by mouth at bedtime.     mometasone (ELOCON) 0.1 % lotion daily as needed.     NOVOLOG 100 UNIT/ML injection Inject 40 Units into the skin daily.     OVER THE COUNTER MEDICATION daily. Magnesium L-Treonate     Pancrelipase, Lip-Prot-Amyl, (CREON) 3000-9500 units CPEP Take 12,000 Units by mouth daily.     PREMARIN vaginal cream every other day.     thyroid (ARMOUR THYROID) 60 MG tablet Take 1 tablet by mouth daily.     torsemide ([name]) 20 MG tablet TAKE 1 TABLET ONCE DAILY INTHE MORNING. MAY TAKE AN   EXTRA 1/2 TABLET AS        NEEDED 135 tablet 0   valACYclovir (VALTREX) 500 MG tablet Take 500 mg by mouth daily as needed (Flair up).     No current facility-administered medications for this visit.     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   Family history of adverse reaction to anesthesia    mother: " while she was under she stopped breathing."   Fibromyalgia    Gastroparesis    GERD (gastroesophageal reflux disease)    Headache    migraines   Hypothyroidism    IBS (irritable bowel syndrome)    Idiopathic edema    Iron deficiency anemia 09/04/2019   Lyme disease    Mitral valve prolapse    Myocardial infarction ([place])    1 major in 1999 and 2 minor " small vessel disease."   Osteoporosis    Peripheral neuropathy    Peripheral vascular disease (HCC)    Sinus disorder    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   Stroke ([place])    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" " the second stroke was a lacunar     Past Surgical History:   Procedure Laterality Date   ABDOMINAL HYSTERECTOMY     APPENDECTOMY     BREAST SURGERY     B/L biopsy and lumpectomy    CARDIAC CATHETERIZATION     CARPAL TUNNEL RELEASE     CATARACT EXTRACTION W/ INTRAOCULAR LENS IMPLANT     right eye   COLONOSCOPY W/ BIOPSIES AND POLYPECTOMY     CORONARY ARTERY BYPASS GRAFT     coronary artery stents     at LAD and LIMA   ECTOPIC PREGNANCY SURGERY     NASAL SEPTUM SURGERY     OPEN REDUCTION INTERNAL FIXATION (ORIF) DISTAL RADIAL FRACTURE Right 05/06/2015   Procedure: OPEN REDUCTION INTERNAL FIXATION (ORIF) RIGHT DISTAL RADIAL FRACTURE AND REPAIRS AS NEEDED;  Surgeon: Iran Planas, MD;  Location: [facility];  Service: Orthopedics;  Laterality: Right;   ORIF HUMERUS FRACTURE Right 04/30/2020   Procedure: OPEN REDUCTION INTERNAL FIXATION (ORIF) PROXIMAL HUMERUS FRACTURE;  Surgeon: Justice Britain, MD;  Location: [facility];  Service: Orthopedics;  Laterality: Right;  123mn   ORIF WRIST FRACTURE Left 11/05/2014   ORIF WRIST FRACTURE Left 11/05/2014   Procedure: OPEN REDUCTION INTERNAL FIXATION (ORIF) LEFT WRIST FRACTURE AND REPAIR AS INDICATED;  Surgeon: FIran Planas MD;  Location[facility]  Service: Orthopedics;  Laterality: Left;   TRIGGER FINGER RELEASE      Social History   Socioeconomic History   Marital status: Married    Spouse name: Not on file   Number of children: 0   Years of education: college   Highest education level: Not on file  Occupational History   Occupation: Retired  Tobacco Use   Smoking status: Former    Packs/day: 1.00    Years: 10.00    Total pack years: 10.00    Types: Cigarettes    Quit date: 1985    Years since quitting: 39.0   Smokeless tobacco: Never   Tobacco comments:     " Quit smoking cigarettes in 20's "  Vaping Use   Vaping Use: Never used  Substance and Sexual Activity   Alcohol use: Yes    Comment: occasional beer or wine   Drug use: No   Sexual activity: Not Currently  Other Topics Concern   Not on file   Social History Narrative   Drinks 1-2  cups caffeine drinks a day    Right handed   Lives at home with husband and dog   Social Determinants of Health   Financial Resource Strain: Not on file  Food Insecurity:  Not on file  Transportation Needs: Not on file  Physical Activity: Not on file  Stress: Not on file  Social Connections: Not on file  Intimate Partner Violence: Not on file    Family History  Problem Relation Age of Onset   Breast cancer Mother    Migraines Mother    Heart disease Father    Hypertension Father    Breast cancer Sister     ROS: no fevers or chills, productive cough, hemoptysis, dysphasia, odynophagia, melena, hematochezia, dysuria, hematuria, rash, seizure activity, orthopnea, PND, pedal edema, claudication. Remaining systems are negative.  Physical Exam: Well-developed well-nourished in no acute distress.  Skin is warm and dry.  HEENT is normal.  Neck is supple.  Chest is clear to auscultation with normal expansion.  Cardiovascular exam is regular rate and rhythm.  Abdominal exam nontender or distended. No masses palpated. Extremities show no edema. neuro grossly intact   A/P  1 coronary artery disease-patient continues to have occasional atypical chest pain that is unchanged.  Plan to continue medical therapy.  Continue aspirin.  She is intolerant to statins.  2 hyperlipidemia-intolerant to statins and Zetia.  We discussed PCSK9 inhibitor but she again declined.  3 hypertension-patient's blood pressure is elevated; she also complains of a cough which could be related to lisinopril.  I will discontinue lisinopril and treat with Avapro 150 mg daily.  Check potassium and renal function in 1 week.  Follow blood pressure and advance regimen as needed.  4 chronic diastolic congestive heart failure-she is euvolemic.  Continue [name] at present dose.  Check potassium and renal function.  Kirk Ruths, MD

## 2022-02-02 ENCOUNTER — Ambulatory Visit: Payer: Medicare Other

## 2022-02-02 ENCOUNTER — Ambulatory Visit: Payer: Medicare Other | Admitting: Hematology & Oncology

## 2022-02-02 ENCOUNTER — Inpatient Hospital Stay: Payer: Medicare Other

## 2022-02-03 ENCOUNTER — Encounter: Payer: Self-pay | Admitting: Hematology & Oncology

## 2022-02-03 ENCOUNTER — Other Ambulatory Visit: Payer: Self-pay

## 2022-02-03 ENCOUNTER — Inpatient Hospital Stay (HOSPITAL_BASED_OUTPATIENT_CLINIC_OR_DEPARTMENT_OTHER): Payer: Medicare Other | Admitting: Hematology & Oncology

## 2022-02-03 ENCOUNTER — Inpatient Hospital Stay: Payer: Medicare Other

## 2022-02-03 ENCOUNTER — Inpatient Hospital Stay: Payer: Medicare Other | Attending: Hematology & Oncology

## 2022-02-03 VITALS — BP 167/64 | HR 64 | Temp 97.6°F | Resp 16 | Ht <= 58 in | Wt 106.0 lb

## 2022-02-03 DIAGNOSIS — D500 Iron deficiency anemia secondary to blood loss (chronic): Secondary | ICD-10-CM

## 2022-02-03 DIAGNOSIS — E1122 Type 2 diabetes mellitus with diabetic chronic kidney disease: Secondary | ICD-10-CM | POA: Diagnosis not present

## 2022-02-03 DIAGNOSIS — D460 Refractory anemia without ring sideroblasts, so stated: Secondary | ICD-10-CM | POA: Insufficient documentation

## 2022-02-03 DIAGNOSIS — D631 Anemia in chronic kidney disease: Secondary | ICD-10-CM

## 2022-02-03 DIAGNOSIS — Z8673 Personal history of transient ischemic attack (TIA), and cerebral infarction without residual deficits: Secondary | ICD-10-CM | POA: Diagnosis not present

## 2022-02-03 DIAGNOSIS — Z794 Long term (current) use of insulin: Secondary | ICD-10-CM | POA: Diagnosis not present

## 2022-02-03 LAB — CMP (CANCER CENTER ONLY)
ALT: 12 U/L (ref 0–44)
AST: 21 U/L (ref 15–41)
Albumin: 3.7 g/dL (ref 3.5–5.0)
Alkaline Phosphatase: 88 U/L (ref 38–126)
Anion gap: 10 (ref 5–15)
BUN: 37 mg/dL — ABNORMAL HIGH (ref 8–23)
CO2: 25 mmol/L (ref 22–32)
Calcium: 9 mg/dL (ref 8.9–10.3)
Chloride: 95 mmol/L — ABNORMAL LOW (ref 98–111)
Creatinine: 2.32 mg/dL — ABNORMAL HIGH (ref 0.44–1.00)
GFR, Estimated: 22 mL/min — ABNORMAL LOW (ref >60)
Glucose, Bld: 276 mg/dL — ABNORMAL HIGH (ref 70–99)
Potassium: 4.8 mmol/L (ref 3.5–5.1)
Sodium: 130 mmol/L — ABNORMAL LOW (ref 135–145)
Total Bilirubin: 0.4 mg/dL (ref 0.3–1.2)
Total Protein: 6.3 g/dL — ABNORMAL LOW (ref 6.5–8.1)

## 2022-02-03 LAB — CBC WITH DIFFERENTIAL (CANCER CENTER ONLY)
Abs Immature Granulocytes: 0.01 10*3/uL (ref 0.00–0.07)
Basophils Absolute: 0.1 10*3/uL (ref 0.0–0.1)
Basophils Relative: 2 %
Eosinophils Absolute: 0.6 10*3/uL — ABNORMAL HIGH (ref 0.0–0.5)
Eosinophils Relative: 10 %
HCT: 35 % — ABNORMAL LOW (ref 36.0–46.0)
Hemoglobin: 11.1 g/dL — ABNORMAL LOW (ref 12.0–15.0)
Immature Granulocytes: 0 %
Lymphocytes Relative: 20 %
Lymphs Abs: 1.2 10*3/uL (ref 0.7–4.0)
MCH: 28.7 pg (ref 26.0–34.0)
MCHC: 31.7 g/dL (ref 30.0–36.0)
MCV: 90.4 fL (ref 80.0–100.0)
Monocytes Absolute: 0.5 10*3/uL (ref 0.1–1.0)
Monocytes Relative: 8 %
Neutro Abs: 3.5 10*3/uL (ref 1.7–7.7)
Neutrophils Relative %: 60 %
Platelet Count: 297 10*3/uL (ref 150–400)
RBC: 3.87 MIL/uL (ref 3.87–5.11)
RDW: 13.6 % (ref 11.5–15.5)
WBC Count: 5.7 10*3/uL (ref 4.0–10.5)
nRBC: 0 % (ref 0.0–0.2)

## 2022-02-03 LAB — RETICULOCYTES
Immature Retic Fract: 2.1 % — ABNORMAL LOW (ref 2.3–15.9)
RBC.: 3.88 MIL/uL (ref 3.87–5.11)
Retic Count, Absolute: 26.8 10*3/uL (ref 19.0–186.0)
Retic Ct Pct: 0.7 % (ref 0.4–3.1)

## 2022-02-03 LAB — FERRITIN: Ferritin: 37 ng/mL (ref 11–307)

## 2022-02-03 NOTE — Progress Notes (Signed)
Hematology and Oncology Follow Up Visit  Brittney Pace [id number] Aug 30, 1955 67 y.o. 02/03/2022   Principle Diagnosis:  Anemia of erythropoietin deficiency - chronic kidney disease Insulin-dependent diabetes History of TIAs   Current Therapy:        Aranesp 300 mcg SQ to maintain Hgb > 11    Interim History:  Ms. Pace is here today for follow-up.  She is feeling a little bit tired.  She has had no bleeding.  Her blood sugars are still on the high side.  Morning labs are December, her ferritin was 68 with an iron saturation of 32%.  Her blood sugars continue to be a real problem for her.  She is under some stress right now.  This is why her blood sugars are high.  Her blood pressure is also high.  Nephrologist is trying to help this.  She is on 3 different blood pressure medicines.  She has had no fever.  There has been no change in bowel or bladder habits.  When we last saw her, we did a urinalysis and this did not show any obvious infection.  She still having some hip pain over the right hip.  There has been no rashes.  She has had no leg swelling.  Overall, I would say that her performance status is ECOG 2.    Medications:  Allergies as of 02/03/2022       Reactions   Morphine Anaphylaxis, Shortness Of Breath   Penicillins Hives   Tolerated ANCEF on 04/30/20 Has patient had a PCN reaction causing immediate rash, facial/tongue/throat swelling, SOB or lightheadedness with hypotension: Yes Has patient had a PCN reaction causing severe rash involving mucus membranes or skin necrosis: No Has patient had a PCN reaction that required hospitalization No Has patient had a PCN reaction occurring within the last 10 years: No If all of the above answers are "NO", then may proceed with Cephalosporin use.   Tramadol Anaphylaxis   Acetaminophen Other (See Comments)   Alters insulin pump readings   Amitriptyline Other (See Comments)   Severe headache/ out of body feeling    Codeine Other (See Comments)   Severe headaches/ out of body feeling   Ibuprofen Other (See Comments)   Messes up CGM reading on glucose monitor   Krill Oil Diarrhea, Itching, Nausea And Vomiting   Losartan Cough   Propoxyphene Other (See Comments)   Severe headaches / out of body feeling   Shellfish Allergy Diarrhea, Nausea And Vomiting   Statins Other (See Comments)   Muscle pains Other reaction(s): Other   Sulfamethoxazole Other (See Comments)   Mouth ulcers   Fluconazole    Other reaction(s): Contact Dermatitis (intolerance)   Norvasc [amlodipine Besylate] Swelling        Medication List        Accurate as of February 03, 2022  3:51 PM. If you have any questions, ask your nurse or doctor.          Accu-Chek Guide test strip Generic drug: glucose blood 3 (three) times daily.   AMBULATORY NON FORMULARY MEDICATION Incontinence pads per patient preference, #unlimited, refill x99 Fax to [phone]   Armour Thyroid 60 MG tablet Generic drug: thyroid Take 1 tablet by mouth daily.   aspirin EC 325 MG tablet Take 1 tablet (325 mg total) by mouth daily. What changed: when to take this   carvedilol 12.5 MG tablet Commonly known as: COREG Take 1 tablet (12.5 mg total) by mouth 2 (two) times daily with  a meal.   cetirizine 10 MG tablet Commonly known as: ZYRTEC Take 10 mg by mouth daily.   ciprofloxacin 500 MG tablet Commonly known as: Cipro Take 1 tablet (500 mg total) by mouth 2 (two) times daily.   Creon 3000-9500 units Cpep Generic drug: Pancrelipase (Lip-Prot-Amyl) Take 12,000 Units by mouth daily.   Dexcom G6 Sensor Misc USE TO CONTINUOUSLY MONITOR BLOOD SUGARS. CHANGE EVERY 10 DAYS   diphenoxylate-atropine 2.5-0.025 MG tablet Commonly known as: LOMOTIL TAKE 1 TABLET BY MOUTH 4 TIMES DAILY AS NEEDED FOR DIARRHEA OR  LOOSE  STOOLS   esomeprazole 20 MG capsule Commonly known as: NEXIUM Take 20 mg by mouth every morning.   insulin pump Soln Inject  into the skin continuous. Novolog insulin   ketoconazole 2 % shampoo Commonly known as: NIZORAL SMARTSIG:Topical 2-3 Times Weekly   lamoTRIgine 25 MG tablet Commonly known as: LAMICTAL Take by mouth.   lisinopril 10 MG tablet Commonly known as: ZESTRIL Take 10 mg by mouth daily.   Melatonin Gummies 2.5 MG Chew Chew by mouth at bedtime as needed.   mirtazapine 30 MG tablet Commonly known as: REMERON Take 30 mg by mouth at bedtime.   mometasone 0.1 % lotion Commonly known as: ELOCON daily as needed.   NovoLOG 100 UNIT/ML injection Generic drug: insulin aspart Inject 40 Units into the skin daily.   OVER THE COUNTER MEDICATION daily. Magnesium L-Treonate   Premarin vaginal cream Generic drug: conjugated estrogens every other day.   torsemide 20 MG tablet Commonly known as: [name] TAKE 1 TABLET ONCE DAILY INTHE MORNING. MAY TAKE AN   EXTRA 1/2 TABLET AS        NEEDED   valACYclovir 500 MG tablet Commonly known as: VALTREX Take 500 mg by mouth daily as needed (Flair up).        Allergies:  Allergies  Allergen Reactions   Morphine Anaphylaxis and Shortness Of Breath   Penicillins Hives    Tolerated ANCEF on 04/30/20 Has patient had a PCN reaction causing immediate rash, facial/tongue/throat swelling, SOB or lightheadedness with hypotension: Yes Has patient had a PCN reaction causing severe rash involving mucus membranes or skin necrosis: No Has patient had a PCN reaction that required hospitalization No Has patient had a PCN reaction occurring within the last 10 years: No If all of the above answers are "NO", then may proceed with Cephalosporin use.   Tramadol Anaphylaxis   Acetaminophen Other (See Comments)    Alters insulin pump readings    Amitriptyline Other (See Comments)    Severe headache/ out of body feeling   Codeine Other (See Comments)    Severe headaches/ out of body feeling    Ibuprofen Other (See Comments)    Messes up CGM reading on glucose  monitor     Krill Oil Diarrhea, Itching and Nausea And Vomiting   Losartan Cough   Propoxyphene Other (See Comments)    Severe headaches / out of body feeling    Shellfish Allergy Diarrhea and Nausea And Vomiting   Statins Other (See Comments)    Muscle pains Other reaction(s): Other   Sulfamethoxazole Other (See Comments)    Mouth ulcers    Fluconazole     Other reaction(s): Contact Dermatitis (intolerance)   Norvasc [Amlodipine Besylate] Swelling    Past Medical History, Surgical history, Social history, and Family History were reviewed and updated.  Review of Systems: Review of Systems  Constitutional:  Positive for malaise/fatigue.  HENT: Negative.    Eyes: Negative.  Respiratory: Negative.    Cardiovascular:  Positive for leg swelling.  Gastrointestinal:  Positive for abdominal pain.  Genitourinary: Negative.   Musculoskeletal:  Positive for joint pain and myalgias.  Skin:  Positive for rash.  Neurological: Negative.   Endo/Heme/Allergies: Negative.   Psychiatric/Behavioral: Negative.       Physical Exam:   Wt Readings from Last 3 Encounters:  02/03/22 106 lb (48.1 kg)  01/06/22 106 lb 1.9 oz (48.1 kg)  01/01/22 110 lb (49.9 kg)  Vital signs show temperature 98.2.  Pulse 68.  Blood pressure 173/80.  Weight is 110 pounds.  Physical Exam Vitals reviewed.  HENT:     Head: Normocephalic and atraumatic.  Eyes:     Pupils: Pupils are equal, round, and reactive to light.  Cardiovascular:     Rate and Rhythm: Normal rate and regular rhythm.     Heart sounds: Normal heart sounds.  Pulmonary:     Effort: Pulmonary effort is normal.     Breath sounds: Normal breath sounds.  Abdominal:     General: Bowel sounds are normal.     Palpations: Abdomen is soft.  Musculoskeletal:        General: No tenderness or deformity. Normal range of motion.     Cervical back: Normal range of motion.     Comments: On her extremities, there is some edema in her legs.  She has  about 2+ edema.  She has a macular type reddish rash in the lower extremities.  This is in the areas above her ankles.  There is a couple areas that might be a little bit open.  Lymphadenopathy:     Cervical: No cervical adenopathy.  Skin:    General: Skin is warm and dry.     Findings: No erythema or rash.  Neurological:     Mental Status: She is alert and oriented to person, place, and time.  Psychiatric:        Behavior: Behavior normal.        Thought Content: Thought content normal.        Judgment: Judgment normal.     Lab Results  Component Value Date   WBC 5.7 02/03/2022   HGB 11.1 (L) 02/03/2022   HCT 35.0 (L) 02/03/2022   MCV 90.4 02/03/2022   PLT 297 02/03/2022   Lab Results  Component Value Date   FERRITIN 68 01/06/2022   IRON 100 01/06/2022   TIBC 318 01/06/2022   UIBC 218 01/06/2022   IRONPCTSAT 32 (H) 01/06/2022   Lab Results  Component Value Date   RETICCTPCT 0.7 02/03/2022   RBC 3.88 02/03/2022   No results found for: "KPAFRELGTCHN", "LAMBDASER", "KAPLAMBRATIO" No results found for: "IGGSERUM", "IGA", "IGMSERUM" No results found for: "TOTALPROTELP", "ALBUMINELP", "A1GS", "A2GS", "BETS", "BETA2SER", "GAMS", "MSPIKE", "SPEI"   Chemistry      Component Value Date/Time   NA 130 (L) 02/03/2022 1510   NA 138 07/17/2017 1128   K 4.8 02/03/2022 1510   CL 95 (L) 02/03/2022 1510   CO2 25 02/03/2022 1510   BUN 37 (H) 02/03/2022 1510   BUN 23 07/17/2017 1128   CREATININE 2.32 (H) 02/03/2022 1510   CREATININE 0.99 07/20/2018 0821      Component Value Date/Time   CALCIUM 9.0 02/03/2022 1510   ALKPHOS 88 02/03/2022 1510   AST 21 02/03/2022 1510   ALT 12 02/03/2022 1510   BILITOT 0.4 02/03/2022 1510       Impression and Plan: Ms. Ange is a very pleasant  67 yo female with multifactorial anemia.  I am happy that her hemoglobin is better.  Thankfully, she does not need any Aranesp today.    Again, her sodium and chloride are little bit low.  She says  that she we will be able to hydrate at home.  Her blood sugars are quite high.  She says that this is from a little bit of stress.  She will continue to work on this.  We will try to get her back in 6 weeks.  I will try to get her through the Winter so she would not commit with any potential bad weather.    [name], MD 1/11/20243:51 PM

## 2022-02-04 LAB — IRON AND IRON BINDING CAPACITY (CC-WL,HP ONLY)
Iron: 66 ug/dL (ref 28–170)
Saturation Ratios: 21 % (ref 10.4–31.8)
TIBC: 311 ug/dL (ref 250–450)
UIBC: 245 ug/dL (ref 148–442)

## 2022-02-07 ENCOUNTER — Ambulatory Visit (INDEPENDENT_AMBULATORY_CARE_PROVIDER_SITE_OTHER): Payer: Medicare Other | Admitting: Cardiology

## 2022-02-07 ENCOUNTER — Encounter: Payer: Self-pay | Admitting: *Deleted

## 2022-02-07 ENCOUNTER — Encounter: Payer: Self-pay | Admitting: Cardiology

## 2022-02-07 VITALS — BP 149/67 | HR 66 | Ht <= 58 in | Wt 105.1 lb

## 2022-02-07 DIAGNOSIS — I2510 Atherosclerotic heart disease of native coronary artery without angina pectoris: Secondary | ICD-10-CM

## 2022-02-07 DIAGNOSIS — E7800 Pure hypercholesterolemia, unspecified: Secondary | ICD-10-CM

## 2022-02-07 DIAGNOSIS — I5032 Chronic diastolic (congestive) heart failure: Secondary | ICD-10-CM | POA: Diagnosis not present

## 2022-02-07 DIAGNOSIS — I10 Essential (primary) hypertension: Secondary | ICD-10-CM

## 2022-02-07 NOTE — Addendum Note (Signed)
Addended by: Cristopher Estimable on: 02/07/2022 01:53 PM   Modules accepted: Orders

## 2022-02-07 NOTE — Patient Instructions (Addendum)
Medication Instructions:   STOP LISINOPRIL  START IRBESARTAN 150 MG ONCE DAILY  *If you need a refill on your cardiac medications before your next appointment, please call your pharmacy*   Lab Work: Your physician recommends that you return for lab work in: [place] on the 2 nd floor in [address] Hours- Mon-Thur 8 am-11:30 am and 1 pm - 4:30 pm             Fri-8am - 11 am   If you have labs (blood work) drawn today and your tests are completely normal, you will receive your results only by: [place] (if you have MyChart) OR A paper copy in the mail If you have any lab test that is abnormal or we need to change your treatment, we will call you to review the results.   Follow-Up: At [facility], you and your health needs are our priority.  As part of our continuing mission to provide you with exceptional heart care, we have created designated Provider Care Teams.  These Care Teams include your primary Cardiologist (physician) and Advanced Practice Providers (APPs -  Physician Assistants and Nurse Practitioners) who all work together to provide you with the care you need, when you need it.  We recommend signing up for the patient portal called "MyChart".  Sign up information is provided on this After Visit Summary.  MyChart is used to connect with patients for Virtual Visits (Telemedicine).  Patients are able to view lab/test results, encounter notes, upcoming appointments, etc.  Non-urgent messages can be sent to your provider as well.   To learn more about what you can do with MyChart, go to [email or web address].    Your next appointment:   12 month(s)  Provider:   Kirk Ruths, MD

## 2022-02-08 ENCOUNTER — Telehealth: Payer: Self-pay | Admitting: Cardiology

## 2022-02-08 MED ORDER — IRBESARTAN 150 MG PO TABS: 150.0000 mg | ORAL_TABLET | Freq: Every day | ORAL | 3 refills | Status: DC

## 2022-02-08 NOTE — Telephone Encounter (Addendum)
Spoke to patient she prefer a ninety day supply.  E-scribed irbesaratn 150 mg daily

## 2022-02-08 NOTE — Telephone Encounter (Signed)
Pt c/o medication issue:  1. Name of Medication:   IRBESARTAN 150 MG ONCE DAILY  2. How are you currently taking this medication (dosage and times per day)?  Not started yet  3. Are you having a reaction (difficulty breathing--STAT)? N/A  4. What is your medication issue?   Patient stated she was prescribed this medication by Dr. Stanford Breed but the prescription has not been sent to her [place], [place].

## 2022-02-15 ENCOUNTER — Encounter: Payer: Self-pay | Admitting: Cardiology

## 2022-02-15 DIAGNOSIS — I10 Essential (primary) hypertension: Secondary | ICD-10-CM

## 2022-02-15 MED ORDER — HYDRALAZINE HCL 25 MG PO TABS: 25.0000 mg | ORAL_TABLET | Freq: Three times a day (TID) | ORAL | 1 refills | Status: DC

## 2022-02-17 ENCOUNTER — Telehealth: Payer: Self-pay | Admitting: *Deleted

## 2022-02-17 NOTE — Telephone Encounter (Signed)
Message received from patient wanting to hold off on receiving iron infusion at this time and states that she will increase her red meat intake.  Scheduling and [name] notified.

## 2022-03-04 ENCOUNTER — Other Ambulatory Visit: Payer: Self-pay | Admitting: *Deleted

## 2022-03-04 DIAGNOSIS — I2510 Atherosclerotic heart disease of native coronary artery without angina pectoris: Secondary | ICD-10-CM

## 2022-03-04 LAB — BASIC METABOLIC PANEL
BUN/Creatinine Ratio: 22 (calc) (ref 6–22)
BUN: 53 mg/dL — ABNORMAL HIGH (ref 7–25)
CO2: 21 mmol/L (ref 20–32)
Calcium: 9.7 mg/dL (ref 8.6–10.4)
Chloride: 99 mmol/L (ref 98–110)
Creat: 2.45 mg/dL — ABNORMAL HIGH (ref 0.50–1.05)
Glucose, Bld: 133 mg/dL (ref 65–139)
Potassium: 5.8 mmol/L — ABNORMAL HIGH (ref 3.5–5.3)
Sodium: 133 mmol/L — ABNORMAL LOW (ref 135–146)

## 2022-03-17 ENCOUNTER — Encounter (HOSPITAL_BASED_OUTPATIENT_CLINIC_OR_DEPARTMENT_OTHER): Payer: Self-pay | Admitting: Urology

## 2022-03-17 ENCOUNTER — Inpatient Hospital Stay: Payer: Medicare Other

## 2022-03-17 ENCOUNTER — Telehealth: Payer: Self-pay | Admitting: *Deleted

## 2022-03-17 ENCOUNTER — Inpatient Hospital Stay (HOSPITAL_BASED_OUTPATIENT_CLINIC_OR_DEPARTMENT_OTHER): Payer: Medicare Other | Admitting: Hematology & Oncology

## 2022-03-17 ENCOUNTER — Other Ambulatory Visit: Payer: Self-pay

## 2022-03-17 ENCOUNTER — Encounter: Payer: Self-pay | Admitting: Hematology & Oncology

## 2022-03-17 ENCOUNTER — Encounter: Payer: Self-pay | Admitting: *Deleted

## 2022-03-17 ENCOUNTER — Emergency Department (EMERGENCY_DEPARTMENT_HOSPITAL)
Admission: EM | Admit: 2022-03-17 | Discharge: 2022-03-17 | Discharge status: Against Medical Advice | Payer: Medicare Other | Source: Home / Self Care | Attending: Emergency Medicine | Admitting: Emergency Medicine

## 2022-03-17 VITALS — BP 137/58 | HR 65 | Temp 98.4°F | Resp 19 | Ht <= 58 in | Wt 113.0 lb

## 2022-03-17 DIAGNOSIS — Z955 Presence of coronary angioplasty implant and graft: Secondary | ICD-10-CM | POA: Insufficient documentation

## 2022-03-17 DIAGNOSIS — Z20822 Contact with and (suspected) exposure to covid-19: Secondary | ICD-10-CM | POA: Insufficient documentation

## 2022-03-17 DIAGNOSIS — D649 Anemia, unspecified: Secondary | ICD-10-CM | POA: Insufficient documentation

## 2022-03-17 DIAGNOSIS — N184 Chronic kidney disease, stage 4 (severe): Secondary | ICD-10-CM

## 2022-03-17 DIAGNOSIS — E871 Hypo-osmolality and hyponatremia: Secondary | ICD-10-CM

## 2022-03-17 DIAGNOSIS — I5032 Chronic diastolic (congestive) heart failure: Secondary | ICD-10-CM

## 2022-03-17 DIAGNOSIS — I509 Heart failure, unspecified: Secondary | ICD-10-CM | POA: Insufficient documentation

## 2022-03-17 DIAGNOSIS — D631 Anemia in chronic kidney disease: Secondary | ICD-10-CM

## 2022-03-17 DIAGNOSIS — E039 Hypothyroidism, unspecified: Secondary | ICD-10-CM | POA: Insufficient documentation

## 2022-03-17 DIAGNOSIS — E1022 Type 1 diabetes mellitus with diabetic chronic kidney disease: Secondary | ICD-10-CM | POA: Insufficient documentation

## 2022-03-17 DIAGNOSIS — E1010 Type 1 diabetes mellitus with ketoacidosis without coma: Secondary | ICD-10-CM | POA: Diagnosis not present

## 2022-03-17 DIAGNOSIS — I2510 Atherosclerotic heart disease of native coronary artery without angina pectoris: Secondary | ICD-10-CM | POA: Insufficient documentation

## 2022-03-17 DIAGNOSIS — E10319 Type 1 diabetes mellitus with unspecified diabetic retinopathy without macular edema: Secondary | ICD-10-CM | POA: Insufficient documentation

## 2022-03-17 DIAGNOSIS — Z794 Long term (current) use of insulin: Secondary | ICD-10-CM | POA: Insufficient documentation

## 2022-03-17 DIAGNOSIS — R531 Weakness: Secondary | ICD-10-CM

## 2022-03-17 DIAGNOSIS — E1110 Type 2 diabetes mellitus with ketoacidosis without coma: Secondary | ICD-10-CM | POA: Diagnosis not present

## 2022-03-17 DIAGNOSIS — Z7982 Long term (current) use of aspirin: Secondary | ICD-10-CM | POA: Insufficient documentation

## 2022-03-17 DIAGNOSIS — E1122 Type 2 diabetes mellitus with diabetic chronic kidney disease: Secondary | ICD-10-CM | POA: Insufficient documentation

## 2022-03-17 DIAGNOSIS — D460 Refractory anemia without ring sideroblasts, so stated: Secondary | ICD-10-CM | POA: Insufficient documentation

## 2022-03-17 DIAGNOSIS — D638 Anemia in other chronic diseases classified elsewhere: Secondary | ICD-10-CM

## 2022-03-17 DIAGNOSIS — Z8673 Personal history of transient ischemic attack (TIA), and cerebral infarction without residual deficits: Secondary | ICD-10-CM | POA: Insufficient documentation

## 2022-03-17 DIAGNOSIS — D500 Iron deficiency anemia secondary to blood loss (chronic): Secondary | ICD-10-CM

## 2022-03-17 LAB — CMP (CANCER CENTER ONLY)
ALT: 179 U/L — ABNORMAL HIGH (ref 0–44)
AST: 85 U/L — ABNORMAL HIGH (ref 15–41)
Albumin: 3.8 g/dL (ref 3.5–5.0)
Alkaline Phosphatase: 518 U/L — ABNORMAL HIGH (ref 38–126)
Anion gap: 10 (ref 5–15)
BUN: 48 mg/dL — ABNORMAL HIGH (ref 8–23)
CO2: 24 mmol/L (ref 22–32)
Calcium: 8.9 mg/dL (ref 8.9–10.3)
Chloride: 89 mmol/L — ABNORMAL LOW (ref 98–111)
Creatinine: 3.06 mg/dL — ABNORMAL HIGH (ref 0.44–1.00)
GFR, Estimated: 16 mL/min — ABNORMAL LOW (ref >60)
Glucose, Bld: 261 mg/dL — ABNORMAL HIGH (ref 70–99)
Potassium: 4.4 mmol/L (ref 3.5–5.1)
Sodium: 123 mmol/L — ABNORMAL LOW (ref 135–145)
Total Bilirubin: 0.6 mg/dL (ref 0.3–1.2)
Total Protein: 6.3 g/dL — ABNORMAL LOW (ref 6.5–8.1)

## 2022-03-17 LAB — CBC WITH DIFFERENTIAL (CANCER CENTER ONLY)
Abs Immature Granulocytes: 0.02 10*3/uL (ref 0.00–0.07)
Basophils Absolute: 0 10*3/uL (ref 0.0–0.1)
Basophils Relative: 1 %
Eosinophils Absolute: 0.8 10*3/uL — ABNORMAL HIGH (ref 0.0–0.5)
Eosinophils Relative: 14 %
HCT: 28.9 % — ABNORMAL LOW (ref 36.0–46.0)
Hemoglobin: 9.5 g/dL — ABNORMAL LOW (ref 12.0–15.0)
Immature Granulocytes: 0 %
Lymphocytes Relative: 12 %
Lymphs Abs: 0.7 10*3/uL (ref 0.7–4.0)
MCH: 27.5 pg (ref 26.0–34.0)
MCHC: 32.9 g/dL (ref 30.0–36.0)
MCV: 83.8 fL (ref 80.0–100.0)
Monocytes Absolute: 0.6 10*3/uL (ref 0.1–1.0)
Monocytes Relative: 9 %
Neutro Abs: 3.8 10*3/uL (ref 1.7–7.7)
Neutrophils Relative %: 64 %
Platelet Count: 216 10*3/uL (ref 150–400)
RBC: 3.45 MIL/uL — ABNORMAL LOW (ref 3.87–5.11)
RDW: 13.9 % (ref 11.5–15.5)
WBC Count: 5.9 10*3/uL (ref 4.0–10.5)
nRBC: 0 % (ref 0.0–0.2)

## 2022-03-17 LAB — CBC
HCT: 28 % — ABNORMAL LOW (ref 36.0–46.0)
Hemoglobin: 9.5 g/dL — ABNORMAL LOW (ref 12.0–15.0)
MCH: 27.9 pg (ref 26.0–34.0)
MCHC: 33.9 g/dL (ref 30.0–36.0)
MCV: 82.1 fL (ref 80.0–100.0)
Platelets: 223 10*3/uL (ref 150–400)
RBC: 3.41 MIL/uL — ABNORMAL LOW (ref 3.87–5.11)
RDW: 14 % (ref 11.5–15.5)
WBC: 7.4 10*3/uL (ref 4.0–10.5)
nRBC: 0 % (ref 0.0–0.2)

## 2022-03-17 LAB — RESP PANEL BY RT-PCR (RSV, FLU A&B, COVID)  RVPGX2
Influenza A by PCR: NEGATIVE
Influenza B by PCR: NEGATIVE
Resp Syncytial Virus by PCR: NEGATIVE
SARS Coronavirus 2 by RT PCR: NEGATIVE

## 2022-03-17 LAB — RETICULOCYTES
Immature Retic Fract: 6.3 % (ref 2.3–15.9)
RBC.: 3.39 MIL/uL — ABNORMAL LOW (ref 3.87–5.11)
Retic Count, Absolute: 21 10*3/uL (ref 19.0–186.0)
Retic Ct Pct: 0.6 % (ref 0.4–3.1)

## 2022-03-17 LAB — COMPREHENSIVE METABOLIC PANEL
ALT: 171 U/L — ABNORMAL HIGH (ref 0–44)
AST: 79 U/L — ABNORMAL HIGH (ref 15–41)
Albumin: 3.2 g/dL — ABNORMAL LOW (ref 3.5–5.0)
Alkaline Phosphatase: 497 U/L — ABNORMAL HIGH (ref 38–126)
Anion gap: 11 (ref 5–15)
BUN: 47 mg/dL — ABNORMAL HIGH (ref 8–23)
CO2: 19 mmol/L — ABNORMAL LOW (ref 22–32)
Calcium: 8.3 mg/dL — ABNORMAL LOW (ref 8.9–10.3)
Chloride: 91 mmol/L — ABNORMAL LOW (ref 98–111)
Creatinine, Ser: 2.78 mg/dL — ABNORMAL HIGH (ref 0.44–1.00)
GFR, Estimated: 18 mL/min — ABNORMAL LOW (ref >60)
Glucose, Bld: 195 mg/dL — ABNORMAL HIGH (ref 70–99)
Potassium: 4.2 mmol/L (ref 3.5–5.1)
Sodium: 121 mmol/L — ABNORMAL LOW (ref 135–145)
Total Bilirubin: 1.1 mg/dL (ref 0.3–1.2)
Total Protein: 6.2 g/dL — ABNORMAL LOW (ref 6.5–8.1)

## 2022-03-17 LAB — FERRITIN: Ferritin: 118 ng/mL (ref 11–307)

## 2022-03-17 MED ORDER — SODIUM CHLORIDE 0.9 % IV BOLUS
1000.0000 mL | Freq: Once | INTRAVENOUS | Status: AC
  Administered 2022-03-17: 1000 mL via INTRAVENOUS

## 2022-03-17 MED ORDER — DARBEPOETIN ALFA 300 MCG/0.6ML IJ SOSY
300.0000 ug | PREFILLED_SYRINGE | Freq: Once | INTRAMUSCULAR | Status: AC
  Filled 2022-03-17: qty 0.6

## 2022-03-17 NOTE — ED Triage Notes (Signed)
PCP sent for concern for Liver and Kidney labs off Pt states worsening fatigue over past week  Reports headache at this time

## 2022-03-17 NOTE — Progress Notes (Signed)
Hematology and Oncology Follow Up Visit  Brittney Pace [id number] 11-07-1955 67 y.o. 03/17/2022   Principle Diagnosis:  Anemia of erythropoietin deficiency - chronic kidney disease Insulin-dependent diabetes History of TIAs   Current Therapy:        Aranesp 300 mcg SQ to maintain Hgb > 11    Interim History:  Brittney Pace is here today for follow-up.  She is not doing well at all.  She really looks rough.  I am not sure exactly what is going on.  She is very cold.  She is bundled up in blankets.  Her temperature is 98.4.  She says her normal temperature is 96..  She says she is been constipated.  She has had no problems with urine.  There is been no nausea or vomiting.  I am not sure if she was been eating all that much.  Her blood sugars are on the high side as always.  Again, I am not sure exactly what is going on with her.  However, I do not like it.  I really think that she is going need to be evaluated more thoroughly and what we do appear.  She is quite hyponatremic.  I suspect this might be from dehydration.  Her sodium is 123 and her chloride is only 89.  Troubles me is that her BUN is 48 and creatinine 3.06 now.  Also, which is brand-new for her is the fact that she has elevated liver function studies.  I do not know if this is some kind of viral illness that she has.  I do not think this would be COVID.  I would not think this would be influenza.  She has had a cough.  She is not coughing up any mucus.  She has had no obvious bleeding..  She just feels incredibly tired.  Her appetite has been down.  Overall, I would say that her performance status is probably ECOG 3.  .    Medications:  Allergies as of 03/17/2022       Reactions   Morphine Anaphylaxis, Shortness Of Breath   Penicillins Hives   Tolerated ANCEF on 04/30/20 Has patient had a PCN reaction causing immediate rash, facial/tongue/throat swelling, SOB or lightheadedness with hypotension: Yes Has patient  had a PCN reaction causing severe rash involving mucus membranes or skin necrosis: No Has patient had a PCN reaction that required hospitalization No Has patient had a PCN reaction occurring within the last 10 years: No If all of the above answers are "NO", then may proceed with Cephalosporin use.   Tramadol Anaphylaxis   Acetaminophen Other (See Comments)   Alters insulin pump readings   Amitriptyline Other (See Comments)   Severe headache/ out of body feeling   Codeine Other (See Comments)   Severe headaches/ out of body feeling   Ibuprofen Other (See Comments)   Messes up CGM reading on glucose monitor   Krill Oil Diarrhea, Itching, Nausea And Vomiting   Losartan Cough   Propoxyphene Other (See Comments)   Severe headaches / out of body feeling   Shellfish Allergy Diarrhea, Nausea And Vomiting   Statins Other (See Comments)   Muscle pains Other reaction(s): Other   Sulfamethoxazole Other (See Comments)   Mouth ulcers   Sulfites Itching, Other (See Comments)   Mouth ulcers   Fluconazole    Other reaction(s): Contact Dermatitis (intolerance)   Norvasc [amlodipine Besylate] Swelling        Medication List  Accurate as of March 17, 2022  3:46 PM. If you have any questions, ask your nurse or doctor.          STOP taking these medications    irbesartan 150 MG tablet Commonly known as: AVAPRO Stopped by: [name], MD       TAKE these medications    Accu-Chek Guide test strip Generic drug: glucose blood 3 (three) times daily.   AMBULATORY NON FORMULARY MEDICATION Incontinence pads per patient preference, #unlimited, refill x99 Fax to [phone]   Armour Thyroid 60 MG tablet Generic drug: thyroid Take 1 tablet by mouth daily.   aspirin EC 325 MG tablet Take 1 tablet (325 mg total) by mouth daily.   carvedilol 12.5 MG tablet Commonly known as: COREG Take 1 tablet (12.5 mg total) by mouth 2 (two) times daily with a meal.   cetirizine  10 MG tablet Commonly known as: ZYRTEC Take 10 mg by mouth daily.   Creon 3000-9500 units Cpep Generic drug: Pancrelipase (Lip-Prot-Amyl) Take 12,000 Units by mouth daily.   Dexcom G6 Sensor Misc USE TO CONTINUOUSLY MONITOR BLOOD SUGARS. CHANGE EVERY 10 DAYS   diphenoxylate-atropine 2.5-0.025 MG tablet Commonly known as: LOMOTIL TAKE 1 TABLET BY MOUTH 4 TIMES DAILY AS NEEDED FOR DIARRHEA OR  LOOSE  STOOLS   esomeprazole 20 MG capsule Commonly known as: NEXIUM Take 20 mg by mouth every morning.   hydrALAZINE 25 MG tablet Commonly known as: APRESOLINE Take 1 tablet (25 mg total) by mouth 3 (three) times daily.   insulin pump Soln Inject into the skin continuous. Novolog insulin   ketoconazole 2 % shampoo Commonly known as: NIZORAL SMARTSIG:Topical 2-3 Times Weekly   lamoTRIgine 150 MG tablet Commonly known as: LAMICTAL Take 1 tablet by mouth daily.   Melatonin Gummies 2.5 MG Chew Chew by mouth at bedtime as needed.   mirtazapine 30 MG tablet Commonly known as: REMERON Take 30 mg by mouth at bedtime.   mometasone 0.1 % lotion Commonly known as: ELOCON daily as needed.   NovoLOG 100 UNIT/ML injection Generic drug: insulin aspart Inject 40 Units into the skin daily.   OVER THE COUNTER MEDICATION daily. Magnesium L-Treonate   Premarin vaginal cream Generic drug: conjugated estrogens every other day.   torsemide 20 MG tablet Commonly known as: [name] TAKE 1 TABLET ONCE DAILY INTHE MORNING. MAY TAKE AN   EXTRA 1/2 TABLET AS        NEEDED   valACYclovir 500 MG tablet Commonly known as: VALTREX Take 500 mg by mouth daily as needed (Flair up).        Allergies:  Allergies  Allergen Reactions   Morphine Anaphylaxis and Shortness Of Breath   Penicillins Hives    Tolerated ANCEF on 04/30/20 Has patient had a PCN reaction causing immediate rash, facial/tongue/throat swelling, SOB or lightheadedness with hypotension: Yes Has patient had a PCN reaction causing  severe rash involving mucus membranes or skin necrosis: No Has patient had a PCN reaction that required hospitalization No Has patient had a PCN reaction occurring within the last 10 years: No If all of the above answers are "NO", then may proceed with Cephalosporin use.   Tramadol Anaphylaxis   Acetaminophen Other (See Comments)    Alters insulin pump readings    Amitriptyline Other (See Comments)    Severe headache/ out of body feeling   Codeine Other (See Comments)    Severe headaches/ out of body feeling    Ibuprofen Other (See Comments)  Messes up CGM reading on glucose monitor     Krill Oil Diarrhea, Itching and Nausea And Vomiting   Losartan Cough   Propoxyphene Other (See Comments)    Severe headaches / out of body feeling    Shellfish Allergy Diarrhea and Nausea And Vomiting   Statins Other (See Comments)    Muscle pains Other reaction(s): Other   Sulfamethoxazole Other (See Comments)    Mouth ulcers    Sulfites Itching and Other (See Comments)    Mouth ulcers   Fluconazole     Other reaction(s): Contact Dermatitis (intolerance)   Norvasc [Amlodipine Besylate] Swelling    Past Medical History, Surgical history, Social history, and Family History were reviewed and updated.  Review of Systems: Review of Systems  Constitutional:  Positive for malaise/fatigue.  HENT: Negative.    Eyes: Negative.   Respiratory: Negative.    Cardiovascular:  Positive for leg swelling.  Gastrointestinal:  Positive for abdominal pain.  Genitourinary: Negative.   Musculoskeletal:  Positive for joint pain and myalgias.  Skin:  Positive for rash.  Neurological: Negative.   Endo/Heme/Allergies: Negative.   Psychiatric/Behavioral: Negative.       Physical Exam:   Wt Readings from Last 3 Encounters:  03/17/22 113 lb (51.3 kg)  02/07/22 105 lb 1.9 oz (47.7 kg)  02/03/22 106 lb (48.1 kg)  Vital signs show temperature 98.2.  Pulse 68.  Blood pressure 173/80.  Weight is 110  pounds.  Physical Exam Vitals reviewed.  HENT:     Head: Normocephalic and atraumatic.  Eyes:     Pupils: Pupils are equal, round, and reactive to light.  Cardiovascular:     Rate and Rhythm: Normal rate and regular rhythm.     Heart sounds: Normal heart sounds.  Pulmonary:     Effort: Pulmonary effort is normal.     Breath sounds: Normal breath sounds.  Abdominal:     General: Bowel sounds are normal.     Palpations: Abdomen is soft.  Musculoskeletal:        General: No tenderness or deformity. Normal range of motion.     Cervical back: Normal range of motion.     Comments: On her extremities, there is some mild edema in her legs.  .  Lymphadenopathy:     Cervical: No cervical adenopathy.  Skin:    General: Skin is warm and dry.     Findings: No erythema or rash.  Neurological:     Mental Status: She is alert and oriented to person, place, and time.  Psychiatric:        Behavior: Behavior normal.        Thought Content: Thought content normal.        Judgment: Judgment normal.     Lab Results  Component Value Date   WBC 5.9 03/17/2022   HGB 9.5 (L) 03/17/2022   HCT 28.9 (L) 03/17/2022   MCV 83.8 03/17/2022   PLT 216 03/17/2022   Lab Results  Component Value Date   FERRITIN 37 02/03/2022   IRON 66 02/03/2022   TIBC 311 02/03/2022   UIBC 245 02/03/2022   IRONPCTSAT 21 02/03/2022   Lab Results  Component Value Date   RETICCTPCT 0.6 03/17/2022   RBC 3.45 (L) 03/17/2022   RBC 3.39 (L) 03/17/2022   No results found for: "KPAFRELGTCHN", "LAMBDASER", "KAPLAMBRATIO" No results found for: "IGGSERUM", "IGA", "IGMSERUM" No results found for: "TOTALPROTELP", "ALBUMINELP", "A1GS", "A2GS", "BETS", "BETA2SER", "GAMS", "MSPIKE", "SPEI"   Chemistry  Component Value Date/Time   NA 123 (L) 03/17/2022 1445   NA 138 07/17/2017 1128   K 4.4 03/17/2022 1445   CL 89 (L) 03/17/2022 1445   CO2 24 03/17/2022 1445   BUN 48 (H) 03/17/2022 1445   BUN 23 07/17/2017 1128    CREATININE 3.06 (H) 03/17/2022 1445   CREATININE 2.45 (H) 03/03/2022 1129      Component Value Date/Time   CALCIUM 8.9 03/17/2022 1445   ALKPHOS 518 (H) 03/17/2022 1445   AST 85 (H) 03/17/2022 1445   ALT 179 (H) 03/17/2022 1445   BILITOT 0.6 03/17/2022 1445       Impression and Plan: Brittney Pace is a very pleasant 67 yo female with multifactorial anemia.  However, I think her problems are much worse than this right now.  Again I just think that there is something going on with her.  Her LFTs have never been elevated for me.  Renal function is worse.  I we will see about getting her down to the ER.  We will see what they can do to help [place] out.  I know she does have a cardiologist.  I do not see any obvious medication that she is on that would cause her LFTs to be elevated.  Hopefully, this can be taken care of as an outpatient.  I am sure that cultures will be taken.  Her temperature is high for her.  We will probably have to get her back to see [place] in another couple weeks.  I cannot give her any Aranesp today.  This is quite complicated.  I just have not seen her look this rough in the past.     [name], MD 2/22/20243:46 PM

## 2022-03-17 NOTE — ED Notes (Signed)
Called [facility] for transport at 10:19 pm and spoke to [place].

## 2022-03-17 NOTE — ED Provider Notes (Signed)
[facility] Provider Note   CSN: [id number] Arrival date & time: 03/17/22  1610     History  Chief Complaint  Patient presents with   Abnormal Labs    Brittney Pace is a 67 y.o. female with a past medical history of anemia, CHF, CKD stage III, CAD status post CABG, diabetes, fibromyalgia, hypothyroidism, MVP presenting today for evaluation of abnormal lab.  Patient saw heme oncology today was found to have hemoglobin of 9.5, sodium 123, glucose 261, elevated AST and ALT and alk phos, GFR 16. She complains about feeling fatigue. States she is having pain in her R groin with swelling lymph nodes. She has had swelling lymph nodes of the R groin for a long time.  HPI  Past Medical History:  Diagnosis Date   Anemia    Arthritis    Babesiasis    secondary due to lyme disease   CHF (congestive heart failure) ([place])    Chronic kidney disease    stage 3   Coronary artery disease    Depression    Diabetes mellitus without complication (HCC)    Type 1   Diabetic retinopathy ([place])    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   Family history of adverse reaction to anesthesia    mother: " while she was under she stopped breathing."   Fibromyalgia    Gastroparesis    GERD (gastroesophageal reflux disease)    Headache    migraines   Hypothyroidism    IBS (irritable bowel syndrome)    Idiopathic edema    Iron deficiency anemia 09/04/2019   Lyme disease    Mitral valve prolapse    Myocardial infarction ([place])    1 major in 1999 and 2 minor " small vessel disease."   Osteoporosis    Peripheral neuropathy    Peripheral vascular disease (HCC)    Sinus disorder    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" " the second stroke was a lacunar    Past Surgical History:  Procedure Laterality Date   ABDOMINAL HYSTERECTOMY     APPENDECTOMY     BREAST SURGERY     B/L biopsy and lumpectomy     CARDIAC CATHETERIZATION     CARPAL TUNNEL RELEASE     CATARACT EXTRACTION W/ INTRAOCULAR LENS IMPLANT     right eye   COLONOSCOPY W/ BIOPSIES AND POLYPECTOMY     CORONARY ARTERY BYPASS GRAFT     coronary artery stents     at LAD and LIMA   ECTOPIC PREGNANCY SURGERY     NASAL SEPTUM SURGERY     OPEN REDUCTION INTERNAL FIXATION (ORIF) DISTAL RADIAL FRACTURE Right 05/06/2015   Procedure: OPEN REDUCTION INTERNAL FIXATION (ORIF) RIGHT DISTAL RADIAL FRACTURE AND REPAIRS AS NEEDED;  Surgeon: Iran Planas, MD;  Location: [facility];  Service: Orthopedics;  Laterality: Right;   ORIF HUMERUS FRACTURE Right 04/30/2020   Procedure: OPEN REDUCTION INTERNAL FIXATION (ORIF) PROXIMAL HUMERUS FRACTURE;  Surgeon: Justice Britain, MD;  Location: [facility];  Service: Orthopedics;  Laterality: Right;  157mn   ORIF WRIST FRACTURE Left 11/05/2014   ORIF WRIST FRACTURE Left 11/05/2014   Procedure: OPEN REDUCTION INTERNAL FIXATION (ORIF) LEFT WRIST FRACTURE AND REPAIR AS INDICATED;  Surgeon: FIran Planas MD;  Location: [facility]  Service: Orthopedics;  Laterality: Left;   TRIGGER FINGER RELEASE       Home Medications Prior to Admission  medications   Medication Sig Start Date End Date Taking? Authorizing Provider  ACCU-CHEK GUIDE test strip 3 (three) times daily. 05/03/21   [provider]  AMBULATORY NON FORMULARY MEDICATION Incontinence pads per patient preference, #unlimited, refill x99 Fax to [phone] 12/25/18   Emeterio Reeve, DO  aspirin EC 325 MG tablet Take 1 tablet (325 mg total) by mouth daily. 03/06/19   Lelon Perla, MD  carvedilol (COREG) 12.5 MG tablet Take 1 tablet (12.5 mg total) by mouth 2 (two) times daily with a meal. 05/07/21   Crenshaw, Denice Bors, MD  cetirizine (ZYRTEC) 10 MG tablet Take 10 mg by mouth daily. 03/26/21   [provider]  Continuous Blood Gluc Sensor (DEXCOM G6 SENSOR) MISC USE TO CONTINUOUSLY MONITOR BLOOD SUGARS. CHANGE EVERY 10 DAYS 04/02/19   [provider]  diphenoxylate-atropine (LOMOTIL) 2.5-0.025 MG tablet TAKE 1 TABLET BY MOUTH 4 TIMES DAILY AS NEEDED FOR DIARRHEA OR  LOOSE  STOOLS 12/24/20   [name], MD  esomeprazole (NEXIUM) 20 MG capsule Take 20 mg by mouth every morning.     [provider]  hydrALAZINE (APRESOLINE) 25 MG tablet Take 1 tablet (25 mg total) by mouth 3 (three) times daily. 02/15/22 08/14/22  Lelon Perla, MD  Insulin Human (INSULIN PUMP) SOLN Inject into the skin continuous. Novolog insulin    [provider]  ketoconazole (NIZORAL) 2 % shampoo SMARTSIG:Topical 2-3 Times Weekly 09/29/21   [provider]  lamoTRIgine (LAMICTAL) 150 MG tablet Take 1 tablet by mouth daily. 01/25/22   [provider]  Melatonin Gummies 2.5 MG CHEW Chew by mouth at bedtime as needed.    [provider]  mirtazapine (REMERON) 30 MG tablet Take 30 mg by mouth at bedtime. 10/15/21   [provider]  mometasone (ELOCON) 0.1 % lotion daily as needed. 10/08/21   [provider]  NOVOLOG 100 UNIT/ML injection Inject 40 Units into the skin daily. 06/08/20   [provider]  OVER THE COUNTER MEDICATION daily. Magnesium L-Treonate    [provider]  Pancrelipase, Lip-Prot-Amyl, (CREON) 3000-9500 units CPEP Take 12,000 Units by mouth daily. 04/14/21   [provider]  PREMARIN vaginal cream every other day. 03/24/21   [provider]  thyroid (ARMOUR THYROID) 60 MG tablet Take 1 tablet by mouth daily. 07/30/21   [provider]  torsemide ([name]) 20 MG tablet TAKE 1 TABLET ONCE DAILY INTHE MORNING. MAY TAKE AN   EXTRA 1/2 TABLET AS        NEEDED 01/15/18   Trixie Dredge, PA-C  valACYclovir (VALTREX) 500 MG tablet Take 500 mg by mouth daily as needed (Flair up).    [provider]      Allergies    Morphine, Penicillins, Tramadol, Acetaminophen, Amitriptyline, Codeine, Ibuprofen, Krill oil, Losartan,  Propoxyphene, Shellfish allergy, Statins, Sulfamethoxazole, Sulfites, Fluconazole, and Norvasc [amlodipine besylate]    Review of Systems   Review of Systems Negative except as per HPI.  Physical Exam Updated Vital Signs BP (!) 141/67   Pulse 70   Temp 98.3 F (36.8 C) (Oral)   Resp 11   Ht '4\' 10"'$  (1.473 m)   Wt 51.2 kg   SpO2 91%   BMI 23.59 kg/m  Physical Exam Vitals and nursing note reviewed.  Constitutional:      Appearance: Normal appearance. She is ill-appearing.  HENT:     Head: Normocephalic and atraumatic.     Mouth/Throat:     Mouth:  Mucous membranes are moist.  Eyes:     General: No scleral icterus. Cardiovascular:     Rate and Rhythm: Normal rate and regular rhythm.     Pulses: Normal pulses.     Heart sounds: Normal heart sounds.  Pulmonary:     Effort: Pulmonary effort is normal.     Breath sounds: Normal breath sounds.  Abdominal:     General: Abdomen is flat.     Palpations: Abdomen is soft.     Tenderness: There is no abdominal tenderness.  Musculoskeletal:        General: No deformity.  Skin:    General: Skin is warm.     Findings: No rash.  Neurological:     General: No focal deficit present.     Mental Status: She is alert.  Psychiatric:        Mood and Affect: Mood normal.     ED Results / Procedures / Treatments   Labs (all labs ordered are listed, but only abnormal results are displayed) Labs Reviewed  CBC - Abnormal; Notable for the following components:      Result Value   RBC 3.41 (*)    Hemoglobin 9.5 (*)    HCT 28.0 (*)    All other components within normal limits  COMPREHENSIVE METABOLIC PANEL - Abnormal; Notable for the following components:   Sodium 121 (*)    Chloride 91 (*)    CO2 19 (*)    Glucose, Bld 195 (*)    BUN 47 (*)    Creatinine, Ser 2.78 (*)    Calcium 8.3 (*)    Total Protein 6.2 (*)    Albumin 3.2 (*)    AST 79 (*)    ALT 171 (*)    Alkaline Phosphatase 497 (*)    GFR, Estimated 18 (*)    All  other components within normal limits  RESP PANEL BY RT-PCR (RSV, FLU A&B, COVID)  [id number]    EKG None  Radiology No results found.  Procedures Procedures    Medications Ordered in ED Medications  sodium chloride 0.9 % bolus 1,000 mL (0 mLs Intravenous Stopped 03/17/22 2108)    ED Course/ Medical Decision Making/ A&P                             Medical Decision Making Amount and/or Complexity of Data Reviewed Labs: ordered.  Risk Decision regarding hospitalization.   This patient presents to the ED for abnormal lab, this involves an extensive number of treatment options, and is a complaint that carries with a high risk of complications and morbidity.  The differential diagnosis includes electrolyte abnormalities, AKI, anemia, flu, COVID, RSV.  This is not an exhaustive list.  Lab tests: I ordered and personally interpreted labs.  The pertinent results include: WBC unremarkable. Hbg unremarkable. Platelets unremarkable. Electrolytes with hyponatremia 121. BUN 47, creatinine 2.78. Viral panel negative.  Problem list/ ED course/ Critical interventions/ Medical management: HPI: See above Vital signs within normal range and stable throughout visit. Laboratory/imaging studies significant for: See above. On physical examination, patient is afebrile and appears in no acute distress. Patient presents with fatigue, was found to have hyponatremia, worsening anemia, abnormal liver function and kidney function.  Given patient's extensive medical history, ill-appearing and multiple abnormal labs I do think patient requires admission for further evaluation and management.  Normal saline ordered.  I placed patient on 2 L of oxygen as her O2  sat dropped to 89 to 90%. I have reviewed the patient home medicines and have made adjustments as needed.  Cardiac monitoring/EKG: The patient was maintained on a cardiac monitor.  I personally reviewed and interpreted the cardiac monitor which showed an  underlying rhythm of: sinus rhythm.  Additional history obtained: External records from outside source obtained and reviewed including: Chart review including previous notes, labs, imaging.  Consultations obtained: I requested consultation with Dr. Nevada Crane, and discussed lab and imaging findings as well as pertinent plan.  She agrees to admit the patient.  Disposition Patient is admitted to the hospital. However she decided to leave AMA awaiting transfer to [facility]. I have discussed with patient in length the potential risks of leaving AMA. AMA paperwork singed.  This chart was dictated using voice recognition software.  Despite best efforts to proofread,  errors can occur which can change the documentation meaning.          Final Clinical Impression(s) / ED Diagnoses Final diagnoses:  Hyponatremia  Chronic anemia  Stage 4 chronic kidney disease ([place])  Generalized weakness    Rx / DC Orders ED Discharge Orders     None         Rex Kras, [place] 03/18/22 0012    Lajean Saver, MD 03/18/22 2142

## 2022-03-17 NOTE — ED Notes (Signed)
ED TO INPATIENT HANDOFF REPORT  ED Nurse Name and Phone #: Newman Pies [phone]  S Name/Age/Gender Brittney Pace 67 y.o. female Room/Bed: MH01/MH01  Code Status   Code Status: Prior  Home/SNF/Other Home Patient oriented to: self, place, time, and situation Is this baseline? Yes   Triage Complete: Triage complete  Chief Complaint Generalized weakness [R53.1]  Triage Note PCP sent for concern for Liver and Kidney labs off Pt states worsening fatigue over past week  Reports headache at this time    Allergies Allergies  Allergen Reactions   Morphine Anaphylaxis and Shortness Of Breath   Penicillins Hives    Tolerated ANCEF on 04/30/20 Has patient had a PCN reaction causing immediate rash, facial/tongue/throat swelling, SOB or lightheadedness with hypotension: Yes Has patient had a PCN reaction causing severe rash involving mucus membranes or skin necrosis: No Has patient had a PCN reaction that required hospitalization No Has patient had a PCN reaction occurring within the last 10 years: No If all of the above answers are "NO", then may proceed with Cephalosporin use.   Tramadol Anaphylaxis   Acetaminophen Other (See Comments)    Alters insulin pump readings    Amitriptyline Other (See Comments)    Severe headache/ out of body feeling   Codeine Other (See Comments)    Severe headaches/ out of body feeling    Ibuprofen Other (See Comments)    Messes up CGM reading on glucose monitor     Krill Oil Diarrhea, Itching and Nausea And Vomiting   Losartan Cough   Propoxyphene Other (See Comments)    Severe headaches / out of body feeling    Shellfish Allergy Diarrhea and Nausea And Vomiting   Statins Other (See Comments)    Muscle pains Other reaction(s): Other   Sulfamethoxazole Other (See Comments)    Mouth ulcers    Sulfites Itching and Other (See Comments)    Mouth ulcers   Fluconazole     Other reaction(s): Contact Dermatitis (intolerance)    Norvasc [Amlodipine Besylate] Swelling    Level of Care/Admitting Diagnosis ED Disposition     ED Disposition  Admit   Condition  --   Comment  Hospital Area: [facility] [[id number]]  Level of Care: Telemetry [5]  Admit to tele based on following criteria: Monitor for Ischemic changes  May [facility] patient to [facility] or [facility] if equivalent level of care is available:: No  Interfacility transfer: Yes  Covid Evaluation: Asymptomatic - no recent exposure (last 10 days) testing not required  Diagnosis: Generalized weakness [id number]  Admitting Physician: Kayleen Memos [id number]  Attending Physician: Kayleen Memos [id number]  Certification:: I certify this patient will need inpatient services for at least 2 midnights  Estimated Length of Stay: 2          B Medical/Surgery History Past Medical History:  Diagnosis Date   Anemia    Arthritis    Babesiasis    secondary due to lyme disease   CHF (congestive heart failure) ([place])    Chronic kidney disease    stage 3   Coronary artery disease    Depression    Diabetes mellitus without complication ([place])    Type 1   Diabetic retinopathy ([place])    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   Family history of adverse reaction to anesthesia    mother: " while she was under she stopped breathing."   Fibromyalgia    Gastroparesis    GERD (gastroesophageal reflux disease)  Headache    migraines   Hypothyroidism    IBS (irritable bowel syndrome)    Idiopathic edema    Iron deficiency anemia 09/04/2019   Lyme disease    Mitral valve prolapse    Myocardial infarction ([place])    1 major in 1999 and 2 minor " small vessel disease."   Osteoporosis    Peripheral neuropathy    Peripheral vascular disease ([facility])    Sinus disorder    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" " the second stroke was a lacunar    Past Surgical History:  Procedure Laterality  Date   ABDOMINAL HYSTERECTOMY     APPENDECTOMY     BREAST SURGERY     B/L biopsy and lumpectomy    CARDIAC CATHETERIZATION     CARPAL TUNNEL RELEASE     CATARACT EXTRACTION W/ INTRAOCULAR LENS IMPLANT     right eye   COLONOSCOPY W/ BIOPSIES AND POLYPECTOMY     CORONARY ARTERY BYPASS GRAFT     coronary artery stents     at [facility] and [facility]   ECTOPIC PREGNANCY SURGERY     NASAL SEPTUM SURGERY     OPEN REDUCTION INTERNAL FIXATION (ORIF) DISTAL RADIAL FRACTURE Right 05/06/2015   Procedure: OPEN REDUCTION INTERNAL FIXATION (ORIF) RIGHT DISTAL RADIAL FRACTURE AND REPAIRS AS NEEDED;  Surgeon: Iran Planas, MD;  Location: [facility];  Service: Orthopedics;  Laterality: Right;   ORIF HUMERUS FRACTURE Right 04/30/2020   Procedure: OPEN REDUCTION INTERNAL FIXATION (ORIF) PROXIMAL HUMERUS FRACTURE;  Surgeon: Justice Britain, MD;  Location: [facility];  Service: Orthopedics;  Laterality: Right;  137mn   ORIF WRIST FRACTURE Left 11/05/2014   ORIF WRIST FRACTURE Left 11/05/2014   Procedure: OPEN REDUCTION INTERNAL FIXATION (ORIF) LEFT WRIST FRACTURE AND REPAIR AS INDICATED;  Surgeon: FIran Planas MD;  [facility]  Service: Orthopedics;  Laterality: Left;   TRIGGER FINGER RELEASE       A IV Location/Drains/Wounds Patient Lines/Drains/Airways Status     Active Line/Drains/Airways     Name Placement date Placement time Site Days   Peripheral IV 03/17/22 20 G 1" Anterior;Right Forearm 03/17/22  1933  Forearm  less than 1            Intake/Output Last 24 hours  Intake/Output Summary (Last 24 hours) at 03/17/2022 2220 Last data filed at 03/17/2022 2108 Gross per 24 hour  Intake 1000 ml  Output --  Net 1000 ml    Labs/Imaging Results for orders placed or performed during the hospital encounter of 03/17/22 (from the past 48 hour(s))  CBC     Status: Abnormal   Collection Time: 03/17/22  7:28 PM  Result Value Ref Range   WBC 7.4 4.0 - 10.5 K/uL   RBC 3.41 (L) 3.87 - 5.11 MIL/uL   Hemoglobin 9.5  (L) 12.0 - 15.0 g/dL   HCT 28.0 (L) 36.0 - 46.0 %   MCV 82.1 80.0 - 100.0 fL   MCH 27.9 26.0 - 34.0 pg   MCHC 33.9 30.0 - 36.0 g/dL   RDW 14.0 11.5 - 15.5 %   Platelets 223 150 - 400 K/uL   nRBC 0.0 0.0 - 0.2 %    Comment: [facility] [address] [id number] Comprehensive metabolic panel     Status: Abnormal   Collection Time: 03/17/22  7:28 PM  Result Value Ref Range  Sodium 121 (L) 135 - 145 mmol/L   Potassium 4.2 3.5 - 5.1 mmol/L   Chloride 91 (L) 98 - 111 mmol/L   CO2 19 (L) 22 - 32 mmol/L   Glucose, Bld 195 (H) 70 - 99 mg/dL    Comment: Glucose reference range applies only to samples taken after fasting for at least 8 hours.   BUN 47 (H) 8 - 23 mg/dL   Creatinine, Ser 2.78 (H) 0.44 - 1.00 mg/dL   Calcium 8.3 (L) 8.9 - 10.3 mg/dL   Total Protein 6.2 (L) 6.5 - 8.1 g/dL   Albumin 3.2 (L) 3.5 - 5.0 g/dL   AST 79 (H) 15 - 41 U/L   ALT 171 (H) 0 - 44 U/L   Alkaline Phosphatase 497 (H) 38 - 126 U/L   Total Bilirubin 1.1 0.3 - 1.2 mg/dL   GFR, Estimated 18 (L) >60 mL/min    Comment: (NOTE) Calculated using the CKD-EPI Creatinine Equation (2021)    Anion gap 11 5 - 15    Comment: Performed at [facility], [address] [place], [place] [address]  Resp panel by RT-PCR (RSV, Flu A&B, Covid) Nasopharyngeal Swab     Status: None   Collection Time: 03/17/22  9:09 PM   Specimen: Nasopharyngeal Swab; Nasal Swab  Result Value Ref Range   SARS Coronavirus 2 by RT PCR NEGATIVE NEGATIVE    Comment: (NOTE) SARS-CoV-2 target nucleic acids are NOT DETECTED.  The SARS-CoV-2 RNA is generally detectable in upper respiratory specimens during the acute phase of infection. The lowest concentration of SARS-CoV-2 viral copies this assay can detect is 138 copies/mL. A negative result does not preclude SARS-Cov-2 infection and should not be used as the sole basis for treatment or other patient management decisions. A negative result may  occur with  improper specimen collection/handling, submission of specimen other than nasopharyngeal swab, presence of viral mutation(s) within the areas targeted by this assay, and inadequate number of viral copies(<138 copies/mL). A negative result must be combined with clinical observations, patient history, and epidemiological information. The expected result is Negative.  Fact Sheet for Patients:  EntrepreneurPulse.com.au  Fact Sheet for Healthcare Providers:  IncredibleEmployment.be  This test is no t yet approved or cleared by the [place] FDA and  has been authorized for detection and/or diagnosis of SARS-CoV-2 by FDA under an Emergency Use Authorization (EUA). This EUA will remain  in effect (meaning this test can be used) for the duration of the COVID-19 declaration under Section 564(b)(1) of the Act, 21 U.S.C.section 360bbb-3(b)(1), unless the authorization is terminated  or revoked sooner.       Influenza A by PCR NEGATIVE NEGATIVE   Influenza B by PCR NEGATIVE NEGATIVE    Comment: (NOTE) The Xpert Xpress SARS-CoV-2/FLU/RSV plus assay is intended as an aid in the diagnosis of influenza from Nasopharyngeal swab specimens and should not be used as a sole basis for treatment. Nasal washings and aspirates are unacceptable for Xpert Xpress SARS-CoV-2/FLU/RSV testing.  Fact Sheet for Patients: EntrepreneurPulse.com.au  Fact Sheet for Healthcare Providers: IncredibleEmployment.be  This test is not yet approved or cleared by the [place] FDA and has been authorized for detection and/or diagnosis of SARS-CoV-2 by FDA under an Emergency Use Authorization (EUA). This EUA will remain in effect (meaning this test can be used) for the duration of the COVID-19 declaration under Section 564(b)(1) of the Act, 21 U.S.C. section 360bbb-3(b)(1), unless the authorization is terminated or revoked.  Resp Syncytial Virus by PCR NEGATIVE NEGATIVE    Comment: (NOTE) Fact Sheet for Patients: EntrepreneurPulse.com.au  Fact Sheet for Healthcare Providers: IncredibleEmployment.be  This test is not yet approved or cleared by the [place] FDA and has been authorized for detection and/or diagnosis of SARS-CoV-2 by FDA under an Emergency Use Authorization (EUA). This EUA will remain in effect (meaning this test can be used) for the duration of the COVID-19 declaration under Section 564(b)(1) of the Act, 21 U.S.C. section 360bbb-3(b)(1), unless the authorization is terminated or revoked.  Performed at [facility], [address] [place], [place] [address]    No results found.  Pending Labs Unresulted Labs (From admission, onward)    None       Vitals/Pain Today's Vitals   03/17/22 1619 03/17/22 1930 03/17/22 2002 03/17/22 2030  BP: (!) 145/108 (!) 131/107  (!) 141/67  Pulse: 68 69  70  Resp: 18 11    Temp: 98.1 F (36.7 C)  98.3 F (36.8 C)   TempSrc: Oral  Oral   SpO2: 97% 98%  91%  Weight:      Height:      PainSc:        Isolation Precautions Airborne and Contact precautions  Medications Medications  sodium chloride 0.9 % bolus 1,000 mL (0 mLs Intravenous Stopped 03/17/22 2108)    Mobility walks     Focused Assessments Multiple lab abnormalities. See results   R Recommendations: See Admitting Provider Note  Report given to:   Additional Notes: A/O x 4, ambulatory, continent b/b. 20ga IV right forearm. Prefers BP cuff on forearm rather than upper arm due to muscle pain. Very cold-natured.

## 2022-03-17 NOTE — Telephone Encounter (Signed)
Brittney Pace, the lab work from [facility] has been reviewed by Dr Stanford Breed and you kidney function is elevated. Make sure you are keeping your total fluids for the day to 1 liter or 4 cups daily. He would like you to repeat this lab work in 2 weeks. I will mail the paperwork to you. Take care  Lab orders mailed to the pt

## 2022-03-17 NOTE — Patient Instructions (Signed)

## 2022-03-17 NOTE — ED Notes (Signed)
Pt states she had blood drawn earlier today and her PCP sent her to ER due to low HgB, Na+, and Cl-

## 2022-03-18 ENCOUNTER — Emergency Department (HOSPITAL_COMMUNITY): Payer: Medicare Other

## 2022-03-18 ENCOUNTER — Telehealth: Payer: Self-pay | Admitting: *Deleted

## 2022-03-18 ENCOUNTER — Inpatient Hospital Stay (HOSPITAL_COMMUNITY): Payer: Medicare Other

## 2022-03-18 ENCOUNTER — Inpatient Hospital Stay (HOSPITAL_COMMUNITY)
Admission: EM | Admit: 2022-03-18 | Discharge: 2022-03-21 | DRG: 638 | Disposition: A | Discharge status: Home / Self Care | Payer: Medicare Other | Attending: Internal Medicine | Admitting: Internal Medicine

## 2022-03-18 ENCOUNTER — Other Ambulatory Visit: Payer: Self-pay

## 2022-03-18 DIAGNOSIS — Z8249 Family history of ischemic heart disease and other diseases of the circulatory system: Secondary | ICD-10-CM

## 2022-03-18 DIAGNOSIS — Z66 Do not resuscitate: Secondary | ICD-10-CM | POA: Diagnosis present

## 2022-03-18 DIAGNOSIS — E871 Hypo-osmolality and hyponatremia: Secondary | ICD-10-CM

## 2022-03-18 DIAGNOSIS — E10319 Type 1 diabetes mellitus with unspecified diabetic retinopathy without macular edema: Secondary | ICD-10-CM | POA: Diagnosis present

## 2022-03-18 DIAGNOSIS — I252 Old myocardial infarction: Secondary | ICD-10-CM

## 2022-03-18 DIAGNOSIS — E875 Hyperkalemia: Secondary | ICD-10-CM | POA: Diagnosis not present

## 2022-03-18 DIAGNOSIS — E1043 Type 1 diabetes mellitus with diabetic autonomic (poly)neuropathy: Secondary | ICD-10-CM | POA: Diagnosis present

## 2022-03-18 DIAGNOSIS — N1832 Chronic kidney disease, stage 3b: Secondary | ICD-10-CM | POA: Diagnosis not present

## 2022-03-18 DIAGNOSIS — Z79899 Other long term (current) drug therapy: Secondary | ICD-10-CM

## 2022-03-18 DIAGNOSIS — E039 Hypothyroidism, unspecified: Secondary | ICD-10-CM | POA: Diagnosis present

## 2022-03-18 DIAGNOSIS — Z951 Presence of aortocoronary bypass graft: Secondary | ICD-10-CM

## 2022-03-18 DIAGNOSIS — E860 Dehydration: Secondary | ICD-10-CM | POA: Diagnosis present

## 2022-03-18 DIAGNOSIS — I509 Heart failure, unspecified: Secondary | ICD-10-CM

## 2022-03-18 DIAGNOSIS — Z7982 Long term (current) use of aspirin: Secondary | ICD-10-CM

## 2022-03-18 DIAGNOSIS — Z9641 Presence of insulin pump (external) (internal): Secondary | ICD-10-CM | POA: Diagnosis present

## 2022-03-18 DIAGNOSIS — F32A Depression, unspecified: Secondary | ICD-10-CM | POA: Diagnosis not present

## 2022-03-18 DIAGNOSIS — K219 Gastro-esophageal reflux disease without esophagitis: Secondary | ICD-10-CM | POA: Diagnosis present

## 2022-03-18 DIAGNOSIS — E1010 Type 1 diabetes mellitus with ketoacidosis without coma: Secondary | ICD-10-CM | POA: Diagnosis not present

## 2022-03-18 DIAGNOSIS — I10 Essential (primary) hypertension: Secondary | ICD-10-CM

## 2022-03-18 DIAGNOSIS — Z885 Allergy status to narcotic agent status: Secondary | ICD-10-CM

## 2022-03-18 DIAGNOSIS — R7401 Elevation of levels of liver transaminase levels: Secondary | ICD-10-CM | POA: Diagnosis not present

## 2022-03-18 DIAGNOSIS — I129 Hypertensive chronic kidney disease with stage 1 through stage 4 chronic kidney disease, or unspecified chronic kidney disease: Secondary | ICD-10-CM | POA: Diagnosis present

## 2022-03-18 DIAGNOSIS — N189 Chronic kidney disease, unspecified: Secondary | ICD-10-CM

## 2022-03-18 DIAGNOSIS — Z8673 Personal history of transient ischemic attack (TIA), and cerebral infarction without residual deficits: Secondary | ICD-10-CM

## 2022-03-18 DIAGNOSIS — Z794 Long term (current) use of insulin: Secondary | ICD-10-CM | POA: Diagnosis not present

## 2022-03-18 DIAGNOSIS — R748 Abnormal levels of other serum enzymes: Secondary | ICD-10-CM | POA: Diagnosis present

## 2022-03-18 DIAGNOSIS — K589 Irritable bowel syndrome without diarrhea: Secondary | ICD-10-CM | POA: Diagnosis present

## 2022-03-18 DIAGNOSIS — Z880 Allergy status to penicillin: Secondary | ICD-10-CM

## 2022-03-18 DIAGNOSIS — N184 Chronic kidney disease, stage 4 (severe): Secondary | ICD-10-CM | POA: Diagnosis present

## 2022-03-18 DIAGNOSIS — D631 Anemia in chronic kidney disease: Secondary | ICD-10-CM | POA: Diagnosis not present

## 2022-03-18 DIAGNOSIS — E1110 Type 2 diabetes mellitus with ketoacidosis without coma: Secondary | ICD-10-CM | POA: Insufficient documentation

## 2022-03-18 DIAGNOSIS — Z91013 Allergy to seafood: Secondary | ICD-10-CM

## 2022-03-18 DIAGNOSIS — I2510 Atherosclerotic heart disease of native coronary artery without angina pectoris: Secondary | ICD-10-CM | POA: Diagnosis not present

## 2022-03-18 DIAGNOSIS — Z955 Presence of coronary angioplasty implant and graft: Secondary | ICD-10-CM | POA: Diagnosis not present

## 2022-03-18 DIAGNOSIS — E785 Hyperlipidemia, unspecified: Secondary | ICD-10-CM | POA: Diagnosis present

## 2022-03-18 DIAGNOSIS — Z882 Allergy status to sulfonamides status: Secondary | ICD-10-CM

## 2022-03-18 DIAGNOSIS — N1831 Chronic kidney disease, stage 3a: Secondary | ICD-10-CM

## 2022-03-18 DIAGNOSIS — Z888 Allergy status to other drugs, medicaments and biological substances status: Secondary | ICD-10-CM

## 2022-03-18 DIAGNOSIS — E1022 Type 1 diabetes mellitus with diabetic chronic kidney disease: Secondary | ICD-10-CM | POA: Diagnosis present

## 2022-03-18 DIAGNOSIS — Z961 Presence of intraocular lens: Secondary | ICD-10-CM | POA: Diagnosis present

## 2022-03-18 DIAGNOSIS — E1051 Type 1 diabetes mellitus with diabetic peripheral angiopathy without gangrene: Secondary | ICD-10-CM | POA: Diagnosis present

## 2022-03-18 DIAGNOSIS — Z1152 Encounter for screening for COVID-19: Secondary | ICD-10-CM | POA: Diagnosis not present

## 2022-03-18 DIAGNOSIS — I2583 Coronary atherosclerosis due to lipid rich plaque: Secondary | ICD-10-CM

## 2022-03-18 DIAGNOSIS — M797 Fibromyalgia: Secondary | ICD-10-CM | POA: Diagnosis present

## 2022-03-18 DIAGNOSIS — N179 Acute kidney failure, unspecified: Secondary | ICD-10-CM

## 2022-03-18 DIAGNOSIS — N183 Chronic kidney disease, stage 3 unspecified: Secondary | ICD-10-CM | POA: Diagnosis present

## 2022-03-18 DIAGNOSIS — Z87891 Personal history of nicotine dependence: Secondary | ICD-10-CM

## 2022-03-18 DIAGNOSIS — K3184 Gastroparesis: Secondary | ICD-10-CM | POA: Diagnosis present

## 2022-03-18 DIAGNOSIS — Z9841 Cataract extraction status, right eye: Secondary | ICD-10-CM

## 2022-03-18 LAB — CBC WITH DIFFERENTIAL/PLATELET
Abs Immature Granulocytes: 0.06 10*3/uL (ref 0.00–0.07)
Basophils Absolute: 0 10*3/uL (ref 0.0–0.1)
Basophils Relative: 1 %
Eosinophils Absolute: 0.1 10*3/uL (ref 0.0–0.5)
Eosinophils Relative: 2 %
HCT: 32.9 % — ABNORMAL LOW (ref 36.0–46.0)
Hemoglobin: 10.1 g/dL — ABNORMAL LOW (ref 12.0–15.0)
Immature Granulocytes: 1 %
Lymphocytes Relative: 11 %
Lymphs Abs: 0.7 10*3/uL (ref 0.7–4.0)
MCH: 28.1 pg (ref 26.0–34.0)
MCHC: 30.7 g/dL (ref 30.0–36.0)
MCV: 91.4 fL (ref 80.0–100.0)
Monocytes Absolute: 0.4 10*3/uL (ref 0.1–1.0)
Monocytes Relative: 5 %
Neutro Abs: 5.3 10*3/uL (ref 1.7–7.7)
Neutrophils Relative %: 80 %
Platelets: 244 10*3/uL (ref 150–400)
RBC: 3.6 MIL/uL — ABNORMAL LOW (ref 3.87–5.11)
RDW: 14.4 % (ref 11.5–15.5)
WBC: 6.6 10*3/uL (ref 4.0–10.5)
nRBC: 0 % (ref 0.0–0.2)

## 2022-03-18 LAB — COMPREHENSIVE METABOLIC PANEL
ALT: 129 U/L — ABNORMAL HIGH (ref 0–44)
AST: 44 U/L — ABNORMAL HIGH (ref 15–41)
Albumin: 3.1 g/dL — ABNORMAL LOW (ref 3.5–5.0)
Alkaline Phosphatase: 398 U/L — ABNORMAL HIGH (ref 38–126)
BUN: 61 mg/dL — ABNORMAL HIGH (ref 8–23)
CO2: <7 mmol/L — ABNORMAL LOW (ref 22–32)
Calcium: 8.3 mg/dL — ABNORMAL LOW (ref 8.9–10.3)
Chloride: 87 mmol/L — ABNORMAL LOW (ref 98–111)
Creatinine, Ser: 3.85 mg/dL — ABNORMAL HIGH (ref 0.44–1.00)
GFR, Estimated: 12 mL/min — ABNORMAL LOW (ref >60)
Glucose, Bld: 388 mg/dL — ABNORMAL HIGH (ref 70–99)
Potassium: 5.8 mmol/L — ABNORMAL HIGH (ref 3.5–5.1)
Sodium: 122 mmol/L — ABNORMAL LOW (ref 135–145)
Total Bilirubin: 2 mg/dL — ABNORMAL HIGH (ref 0.3–1.2)
Total Protein: 5.7 g/dL — ABNORMAL LOW (ref 6.5–8.1)

## 2022-03-18 LAB — BASIC METABOLIC PANEL
BUN: 63 mg/dL — ABNORMAL HIGH (ref 8–23)
CO2: <7 mmol/L — ABNORMAL LOW (ref 22–32)
Calcium: 7.4 mg/dL — ABNORMAL LOW (ref 8.9–10.3)
Chloride: 93 mmol/L — ABNORMAL LOW (ref 98–111)
Creatinine, Ser: 3 mg/dL — ABNORMAL HIGH (ref 0.44–1.00)
GFR, Estimated: 17 mL/min — ABNORMAL LOW (ref >60)
Glucose, Bld: 370 mg/dL — ABNORMAL HIGH (ref 70–99)
Potassium: 5.3 mmol/L — ABNORMAL HIGH (ref 3.5–5.1)
Sodium: 123 mmol/L — ABNORMAL LOW (ref 135–145)

## 2022-03-18 LAB — IRON AND IRON BINDING CAPACITY (CC-WL,HP ONLY)
Iron: 43 ug/dL (ref 28–170)
Saturation Ratios: 16 % (ref 10.4–31.8)
TIBC: 274 ug/dL (ref 250–450)
UIBC: 231 ug/dL (ref 148–442)

## 2022-03-18 LAB — CBG MONITORING, ED
Glucose-Capillary: 376 mg/dL — ABNORMAL HIGH (ref 70–99)
Glucose-Capillary: 394 mg/dL — ABNORMAL HIGH (ref 70–99)
Glucose-Capillary: 381 mg/dL — ABNORMAL HIGH (ref 70–99)

## 2022-03-18 LAB — BRAIN NATRIURETIC PEPTIDE: B Natriuretic Peptide: 1266.8 pg/mL — ABNORMAL HIGH (ref 0.0–100.0)

## 2022-03-18 LAB — BLOOD GAS, VENOUS
Acid-base deficit: 23.8 mmol/L — ABNORMAL HIGH (ref 0.0–2.0)
Bicarbonate: 4.5 mmol/L — ABNORMAL LOW (ref 20.0–28.0)
O2 Saturation: 95.2 %
Patient temperature: 37.1
pCO2, Ven: <18 mmHg — CL (ref 44–60)
pH, Ven: 7.06 — CL (ref 7.25–7.43)
pO2, Ven: 81 mmHg — ABNORMAL HIGH (ref 32–45)

## 2022-03-18 LAB — BETA-HYDROXYBUTYRIC ACID: Beta-Hydroxybutyric Acid: >8 mmol/L — ABNORMAL HIGH (ref 0.05–0.27)

## 2022-03-18 LAB — TSH: TSH: 1.865 u[IU]/mL (ref 0.350–4.500)

## 2022-03-18 LAB — LIPASE, BLOOD: Lipase: 28 U/L (ref 11–51)

## 2022-03-18 MED ORDER — PANTOPRAZOLE SODIUM 40 MG PO TBEC
40.0000 mg | DELAYED_RELEASE_TABLET | Freq: Every day | ORAL | Status: DC
  Administered 2022-03-19 – 2022-03-21 (×3): 40 mg via ORAL
  Filled 2022-03-18 (×3): qty 1

## 2022-03-18 MED ORDER — INSULIN REGULAR(HUMAN) IN NACL 100-0.9 UT/100ML-% IV SOLN
INTRAVENOUS | Status: DC
  Administered 2022-03-18: 4.6 [IU]/h via INTRAVENOUS
  Administered 2022-03-19: 5.5 [IU]/h via INTRAVENOUS
  Filled 2022-03-18 (×2): qty 100

## 2022-03-18 MED ORDER — LAMOTRIGINE 150 MG PO TABS
150.0000 mg | ORAL_TABLET | Freq: Every day | ORAL | Status: DC
  Administered 2022-03-19 – 2022-03-21 (×3): 150 mg via ORAL
  Filled 2022-03-18: qty 1
  Filled 2022-03-18 (×2): qty 2

## 2022-03-18 MED ORDER — THYROID 60 MG PO TABS
60.0000 mg | ORAL_TABLET | Freq: Every day | ORAL | Status: DC
  Administered 2022-03-19 – 2022-03-21 (×3): 60 mg via ORAL
  Filled 2022-03-18 (×4): qty 1

## 2022-03-18 MED ORDER — LACTATED RINGERS IV SOLN: INTRAVENOUS | Status: DC

## 2022-03-18 MED ORDER — PANCRELIPASE (LIP-PROT-AMYL) 12000-38000 UNITS PO CPEP
12000.0000 [IU] | ORAL_CAPSULE | Freq: Every day | ORAL | Status: DC
  Administered 2022-03-19 – 2022-03-21 (×3): 12000 [IU] via ORAL
  Filled 2022-03-18 (×3): qty 1

## 2022-03-18 MED ORDER — DEXTROSE 50 % IV SOLN: 0.0000 mL | INTRAVENOUS | Status: DC | PRN

## 2022-03-18 MED ORDER — LACTATED RINGERS IV BOLUS
20.0000 mL/kg | Freq: Once | INTRAVENOUS | Status: AC
  Administered 2022-03-18: 1000 mL via INTRAVENOUS

## 2022-03-18 MED ORDER — ASPIRIN 325 MG PO TBEC
325.0000 mg | DELAYED_RELEASE_TABLET | Freq: Every day | ORAL | Status: DC
  Administered 2022-03-19 – 2022-03-21 (×3): 325 mg via ORAL
  Filled 2022-03-18 (×3): qty 1

## 2022-03-18 MED ORDER — SODIUM CHLORIDE 0.9 % IV BOLUS: 1000.0000 mL | Freq: Once | INTRAVENOUS | Status: DC

## 2022-03-18 MED ORDER — LORATADINE 10 MG PO TABS
10.0000 mg | ORAL_TABLET | Freq: Every day | ORAL | Status: DC
  Administered 2022-03-19 – 2022-03-21 (×3): 10 mg via ORAL
  Filled 2022-03-18 (×3): qty 1

## 2022-03-18 MED ORDER — MIRTAZAPINE 15 MG PO TABS
30.0000 mg | ORAL_TABLET | Freq: Every day | ORAL | Status: DC
  Administered 2022-03-19 – 2022-03-20 (×3): 30 mg via ORAL
  Filled 2022-03-18: qty 1
  Filled 2022-03-18 (×2): qty 2

## 2022-03-18 MED ORDER — CARVEDILOL 12.5 MG PO TABS
12.5000 mg | ORAL_TABLET | Freq: Two times a day (BID) | ORAL | Status: DC
  Administered 2022-03-19 – 2022-03-21 (×5): 12.5 mg via ORAL
  Filled 2022-03-18 (×6): qty 1

## 2022-03-18 MED ORDER — DEXTROSE IN LACTATED RINGERS 5 % IV SOLN: INTRAVENOUS | Status: DC

## 2022-03-18 NOTE — Assessment & Plan Note (Signed)
-  presented with 388 with incalculable anion gap and CO2<7 -Last A1C of 7.6 in 12/2021. Insulin pump currently taken off.  - start insulin gtt with goal of 140-180 and AG <12 - IV NS until BG <250, then switch to D5 LR  - BMP q4hr  - keep NPO

## 2022-03-18 NOTE — H&P (Signed)
History and Physical    Patient: Brittney Pace [id number] DOB: 1955-08-29 DOA: 03/18/2022 DOS: the patient was seen and examined on 03/18/2022 PCP: [name], MD  Patient coming from: Home  Chief Complaint:  Chief Complaint  Patient presents with   Abnormal Labs   HPI: Brittney Pace is a 67 y.o. 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   Patient was seen by hematologist [name] yesterday for follow-up of her erythropoietin deficiency due to chronic kidney disease.  However she did not look well and was noted to have hyponatremia, worsening renal insufficiency, new transaminitis and was advised to be seen for further workup in the ED.  However patient left AMA prior to being admitted.  She reports a month of feeling fatigued and weak. Today has felt dehydrated, weak, short of breath and had episode of vomiting. Has been constipated. No urinary output since yesterday. Had brief episode of left sided chest pain yesterday. Today has noticed facial and abdominal swelling. No true fever but feels temperature has been higher than her normal. Low appetite. No sick contact.  Denies tobacco, alcohol or illicit drug use. Has not been using Tylenol.   She wears an insulin pump but took it off hours ago when she arrived in ED.   In the ED, she was afebrile normotensive on room air.  CBC without leukocytosis, hemoglobin of 10.1 with hematocrit of 32.9 which appears more hemoconcentrated than lab work from yesterday.  Hyponatremia of 122, K of 5.8, chloride of 87 with a CO2 less than 7.  Creatinine has worsened to 3.85 from prior of 2.78 with BUN of 61.  CBG of 388 with incalculable anion gap.  LFTs continue to be elevated but is downward trending.  AST of 44 from 79.  ALT of 129 from  171.  Alkaline phosphatase of 398 from 497.  Total bilirubin is elevated to 2 from prior normal of 1.1.  BNP is elevated to 1266.  However chest x-ray was negative for any pulmonary edema. Review of Systems: As mentioned in the history of present illness. All other systems reviewed and are negative. Past Medical History:  Diagnosis Date   Anemia    Arthritis    Babesiasis    secondary due to lyme disease   CHF (congestive heart failure) ([place])    Chronic kidney disease    stage 3   Coronary artery disease    Depression    Diabetes mellitus without complication (HCC)    Type 1   Diabetic retinopathy ([place])    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   Family history of adverse reaction to anesthesia    mother: " while she was under she stopped breathing."   Fibromyalgia    Gastroparesis    GERD (gastroesophageal reflux disease)    Headache    migraines   Hypothyroidism    IBS (irritable bowel syndrome)    Idiopathic edema    Iron deficiency anemia 09/04/2019   Lyme disease    Mitral valve prolapse    Myocardial infarction ([place])    1 major in 1999 and 2 minor " small vessel disease."   Osteoporosis    Peripheral neuropathy    Peripheral vascular disease (HCC)    Sinus disorder    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" " the second stroke was a lacunar    Past Surgical History:  Procedure Laterality Date   ABDOMINAL HYSTERECTOMY     APPENDECTOMY     BREAST SURGERY     B/L biopsy and lumpectomy    CARDIAC CATHETERIZATION     CARPAL TUNNEL RELEASE     CATARACT EXTRACTION W/ INTRAOCULAR LENS IMPLANT     right eye   COLONOSCOPY W/ BIOPSIES AND POLYPECTOMY     CORONARY ARTERY BYPASS GRAFT     coronary artery stents     at [place] and [place]   ECTOPIC PREGNANCY SURGERY     NASAL SEPTUM SURGERY     OPEN REDUCTION INTERNAL FIXATION (ORIF) DISTAL RADIAL FRACTURE Right 05/06/2015   Procedure: OPEN REDUCTION INTERNAL FIXATION  (ORIF) RIGHT DISTAL RADIAL FRACTURE AND REPAIRS AS NEEDED;  Surgeon: Iran Planas, MD;  Location: [facility];  Service: Orthopedics;  Laterality: Right;   ORIF HUMERUS FRACTURE Right 04/30/2020   Procedure: OPEN REDUCTION INTERNAL FIXATION (ORIF) PROXIMAL HUMERUS FRACTURE;  Surgeon: Justice Britain, MD;  Location: [facility];  Service: Orthopedics;  Laterality: Right;  117mn   ORIF WRIST FRACTURE Left 11/05/2014   ORIF WRIST FRACTURE Left 11/05/2014   Procedure: OPEN REDUCTION INTERNAL FIXATION (ORIF) LEFT WRIST FRACTURE AND REPAIR AS INDICATED;  Surgeon: FIran Planas MD;  Location: [facility]  Service: Orthopedics;  Laterality: Left;   TRIGGER FINGER RELEASE     Social History:  reports that she quit smoking about 39 years ago. Her smoking use included cigarettes. She has a 10.00 pack-year smoking history. She has never used smokeless tobacco. She reports current alcohol use. She reports that she does not use drugs.  Allergies  Allergen Reactions   Morphine Anaphylaxis and Shortness Of Breath   Penicillins Hives    Tolerated ANCEF on 04/30/20 Has patient had a PCN reaction causing immediate rash, facial/tongue/throat swelling, SOB or lightheadedness with hypotension: Yes Has patient had a PCN reaction causing severe rash involving mucus membranes or skin necrosis: No Has patient had a PCN reaction that required hospitalization No Has patient had a PCN reaction occurring within the last 10 years: No If all of the above answers are "NO", then may proceed with Cephalosporin use.   Tramadol Anaphylaxis   Acetaminophen Other (See Comments)    Alters insulin pump readings    Amitriptyline Other (See Comments)    Severe headache/ out of body feeling   Codeine Other (See Comments)    Severe headaches/ out of body feeling    Ibuprofen Other (See Comments)    Messes up CGM reading on glucose monitor     Krill Oil Diarrhea, Itching and Nausea And Vomiting   Losartan Cough   Propoxyphene Other (See Comments)     Severe headaches / out of body feeling    Shellfish Allergy Diarrhea and Nausea And Vomiting   Statins Other (See Comments)    Muscle pains Other reaction(s): Other   Sulfamethoxazole Other (See Comments)    Mouth ulcers    Sulfites Itching and Other (See Comments)    Mouth ulcers   Fluconazole     Other reaction(s): Contact Dermatitis (intolerance)   Norvasc [Amlodipine Besylate] Swelling    Family History  Problem Relation Age of Onset   Breast cancer Mother    Migraines Mother    Heart disease Father    Hypertension Father    Breast cancer Sister     Prior to Admission medications   Medication  Sig Start Date End Date Taking? Authorizing Provider  ACCU-CHEK GUIDE test strip 3 (three) times daily. 05/03/21   [provider]  AMBULATORY NON FORMULARY MEDICATION Incontinence pads per patient preference, #unlimited, refill x99 Fax to [phone] 12/25/18   Emeterio Reeve, DO  aspirin EC 325 MG tablet Take 1 tablet (325 mg total) by mouth daily. 03/06/19   Lelon Perla, MD  carvedilol (COREG) 12.5 MG tablet Take 1 tablet (12.5 mg total) by mouth 2 (two) times daily with a meal. 05/07/21   Crenshaw, Denice Bors, MD  cetirizine (ZYRTEC) 10 MG tablet Take 10 mg by mouth daily. 03/26/21   [provider]  Continuous Blood Gluc Sensor (DEXCOM G6 SENSOR) MISC USE TO CONTINUOUSLY MONITOR BLOOD SUGARS. CHANGE EVERY 10 DAYS 04/02/19   [provider]  diphenoxylate-atropine (LOMOTIL) 2.5-0.025 MG tablet TAKE 1 TABLET BY MOUTH 4 TIMES DAILY AS NEEDED FOR DIARRHEA OR  LOOSE  STOOLS 12/24/20   [name], MD  esomeprazole (NEXIUM) 20 MG capsule Take 20 mg by mouth every morning.     [provider]  hydrALAZINE (APRESOLINE) 25 MG tablet Take 1 tablet (25 mg total) by mouth 3 (three) times daily. 02/15/22 08/14/22  Lelon Perla, MD  Insulin Human (INSULIN PUMP) SOLN Inject into the skin continuous. Novolog insulin    [provider]   ketoconazole (NIZORAL) 2 % shampoo SMARTSIG:Topical 2-3 Times Weekly 09/29/21   [provider]  lamoTRIgine (LAMICTAL) 150 MG tablet Take 1 tablet by mouth daily. 01/25/22   [provider]  Melatonin Gummies 2.5 MG CHEW Chew by mouth at bedtime as needed.    [provider]  mirtazapine (REMERON) 30 MG tablet Take 30 mg by mouth at bedtime. 10/15/21   [provider]  mometasone (ELOCON) 0.1 % lotion daily as needed. 10/08/21   [provider]  NOVOLOG 100 UNIT/ML injection Inject 40 Units into the skin daily. 06/08/20   [provider]  OVER THE COUNTER MEDICATION daily. Magnesium L-Treonate    [provider]  Pancrelipase, Lip-Prot-Amyl, (CREON) 3000-9500 units CPEP Take 12,000 Units by mouth daily. 04/14/21   [provider]  PREMARIN vaginal cream every other day. 03/24/21   [provider]  thyroid (ARMOUR THYROID) 60 MG tablet Take 1 tablet by mouth daily. 07/30/21   [provider]  torsemide ([name]) 20 MG tablet TAKE 1 TABLET ONCE DAILY INTHE MORNING. MAY TAKE AN   EXTRA 1/2 TABLET AS        NEEDED 01/15/18   Trixie Dredge, PA-C  valACYclovir (VALTREX) 500 MG tablet Take 500 mg by mouth daily as needed (Flair up).    [provider]    Physical Exam: Vitals:   03/18/22 1802 03/18/22 2030  BP: (!) 106/48 (!) 109/45  Pulse: 66 67  Resp: 18 (!) 26  Temp: 97.6 F (36.4 C)   TempSrc: Oral   SpO2: 98% 98%   Constitutional: NAD, thin ill- toxic appearing female laying in bed Eyes: lids and conjunctivae normal ENMT: Mucous membranes are dry  Neck: normal, supple Respiratory: clear to auscultation bilaterally, no wheezing, no crackles. Normal respiratory effort. No accessory muscle use.  Cardiovascular: Regular rate and rhythm, no murmurs / rubs / gallops. No extremity edema.   Abdomen:soft, mildly distended, no tenderness, no masses palpated. No hepatosplenomegaly. Bowel sounds  positive.  Musculoskeletal: no clubbing / cyanosis. No joint deformity upper and lower extremities. Normal muscle tone.  Skin: no rashes, lesions, ulcers. No  telangiectasia. Neurologic: CN 2-12 grossly intact.  No asterixis. Psychiatric: Normal judgment and insight. Alert and oriented x 3. Normal mood. Data Reviewed:  See HPI  Assessment and Plan: * DKA, type 1 ([place]) -presented with 388 with incalculable anion gap and CO2<7 -Last A1C of 7.6 in 12/2021. Insulin pump currently taken off.  - start insulin gtt with goal of 140-180 and AG <12 - IV NS until BG <250, then switch to D5 LR  - BMP q4hr  - keep NPO  HTN (hypertension) -BP on the softer side. Continue Coreg but will hold hydralazine and [place] while she is dehydrated with DKA.  Acute kidney injury superimposed on chronic kidney disease (HCC) Creatinine has worsened to 3.85 from prior of 2.78 with BUN of 61. 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 Check renal ultrasound. Follow intake and output. Most likely due to dehydration from DKA -Follow creatinine daily -avoid nephrotoxic agent or contrasted studies  Hyperkalemia -K of 5.8. Expect this to resolve with DKA management. -Trending BMP  Hyponatremia -corrected Na for hyperglycemia to 127 -continue to follow sodium with DKA correction  Transaminitis -Hx of positive hepatitis B antigen in the past and saw [facility] GI in 11/2021- had labs at that time with Hep B surface antigen, antibody, IgM, HCV antibody all negative/non-reactive -LFTs continue to be elevated but is downward trending.   - AST of 79 -->44.   - ALT of 171 -->129  - Alkaline phosphatase of 497 -->398 -Denies alcohol or illicit drug. No tylenol use. No recent travel -RUQ ultrasound is negative.  -repeat acute hepatitis panel -check Tylenol level, Lamictal level since she is taking for depression -check PT/INR - Will trend LFTs in the morning. However with her  transaminitis, elevated BNP and sensation of facial/abdominal edema most concerning for cirrhosis. Will need GI consult in the morning for further workup.  Acquired hypothyroidism -continue synthroid  Depression -continue Lamictal. Check level.  Anemia due to stage 3 chronic kidney disease ([place]) -Follows with hematology [name] for a week erythropoietin anemia secondary to CKD and receives monthly Araresp to maintain Hgb >11. Did not receive dose yesterday as she was ill-appearing in his office -Hgb is stable 10.1 with her baseline around 9-11.   CAD (coronary artery disease) S/p CABG -No cardiac symptoms -suspect elevated BNP related to liver       Advance Care Planning: FULL  Consults: none  Family Communication: none at bedside  Severity of Illness: The appropriate patient status for this patient is INPATIENT. Inpatient status is judged to be reasonable and necessary in order to provide the required intensity of service to ensure the patient's safety. The patient's presenting symptoms, physical exam findings, and initial radiographic and laboratory data in the context of their chronic comorbidities is felt to place them at high risk for further clinical deterioration. Furthermore, it is not anticipated that the patient will be medically stable for discharge from the hospital within 2 midnights of admission.   * I certify that at the point of admission it is my clinical judgment that the patient will require inpatient hospital care spanning beyond 2 midnights from the point of admission due to high intensity of service, high risk for further deterioration and high frequency of surveillance required.*  Author: Orene Desanctis, DO 03/18/2022 10:32 PM  For on call review [email or web address].

## 2022-03-18 NOTE — ED Notes (Signed)
US at bedside

## 2022-03-18 NOTE — ED Provider Triage Note (Signed)
Emergency Medicine Provider Triage Evaluation Note  Brittney Pace , a 67 y.o. female  was evaluated in triage.  Pt complains of feeling generally fatigued. States that she feels worse than when she left AMA from admission from [facility] yesterday. She states that she on a diuretic and has not peed today. States she feels dehydrated and is having some RUQ abdominal pain. Was admitted from [facility] yesterday for fatigue, hyponatremia, transaminitis. Left due to wait.  Review of Systems  Positive: See above Negative:   Physical Exam  BP (!) 106/48 (BP Location: Left Wrist)   Pulse 66   Temp 97.6 F (36.4 C) (Oral)   Resp 18   SpO2 98%  Gen:   Awake, ill appearing  Resp:  Normal effort  MSK:   Moves extremities without difficulty  Other:    Medical Decision Making  Medically screening exam initiated at 6:11 PM.  Appropriate orders placed.  Damary Hounshell was informed that the remainder of the evaluation will be completed by another provider, this initial triage assessment does not replace that evaluation, and the importance of remaining in the ED until their evaluation is complete.     Mickie Hillier, PA-C 03/18/22 [phone]

## 2022-03-18 NOTE — Assessment & Plan Note (Signed)
-  continue synthroid 

## 2022-03-18 NOTE — Progress Notes (Signed)
Plan of Care Note for accepted transfer   Patient: Brittney Pace MRN: [id number]   [place]: 03/17/2022  Facility requesting transfer: [facility] ED Requesting Provider: Rex Kras, PA-EDP Reason for transfer: Generalized weakness, abnormal labs  Facility course: 67 year old female with past medical history significant for anemia of chronic disease, chronic diastolic CHF, CKD 3B, coronary artery disease status post CABG, type 1 diabetes, asthma, hypothyroidism, who presented to [facility] ED from home with complaints of generalized fatigue and abnormal labs.  She went to her oncologist today for IV iron infusion.  Labs were drawn and returned with hyponatremia and elevated liver chemistries with worsening anemia.  She was advised to go to the ED for further evaluation.  In the ED, labs abnormalities were confirmed.  The patient received 1 L IV fluid bolus.  EDP requested admission for further evaluation and management of present symptomatology and lab abnormalities.  Admitted at [facility] telemetry medical unit as inpatient status.   Plan of care: The patient is accepted for admission to Telemetry unit, at [facility] [place].  Inpatient status.  Author: Kayleen Memos, DO 03/18/2022  Check [email or web address] for on-call coverage.  Nursing staff, Please call [place] number on Amion as soon as patient's arrival, so appropriate admitting provider can evaluate the pt.

## 2022-03-18 NOTE — Assessment & Plan Note (Signed)
S/p CABG -No cardiac symptoms -suspect elevated BNP related to liver

## 2022-03-18 NOTE — Assessment & Plan Note (Addendum)
-  K of 5.8. Expect this to resolve with DKA management. -Trending BMP

## 2022-03-18 NOTE — Assessment & Plan Note (Signed)
-  Follows with hematology [name] for a week erythropoietin anemia secondary to CKD and receives monthly Araresp to maintain Hgb >11. Did not receive dose yesterday as she was ill-appearing in his office -Hgb is stable 10.1 with her baseline around 9-11.

## 2022-03-18 NOTE — Telephone Encounter (Signed)
Per staff message with [place] (1) dose of IV Iron - [place] - Called patient to get her scheduled for lab and iron infusion which she has denied. She said that she did feel well and I tried to get her to speak with a nurse which she denied as well. She said that she may go to the hospital.

## 2022-03-18 NOTE — ED Provider Notes (Signed)
[facility] Provider Note   CSN: [id number] Arrival date & time: 03/18/22  1754     History  Chief Complaint  Patient presents with   Abnormal Labs    Brittney Pace is a 67 y.o. female.  With past medical history of erythropoietin deficiency anemia, iron deficiency anemia, CHF, chronic kidney disease, CAD, diabetes, fibromyalgia, GERD, MVP who presents to the emergency department with fatigue.  Patient states that she is generally fatigued.  She states that she feels dehydrated. States she has not been eating and drinking much.  Takes a diuretic but states she has not yet peed today.  She is also having right upper quadrant abdominal pain that began today.  States that she has had swelling in her face over the past few days. She additionally, feels mildly short of breath. She denies chest pain, vomiting, diarrhea, fever.   Notably, patient was seen yesterday at [name] for the same complaints.  She had abnormal lab work including a sodium of 121, transaminitis, anemia to 9.5.  She also apparently desatted at some point to 89 to 90% requiring 2 L of nasal cannula.  They admitted her to the hospital but the patient left AMA. Also saw Oncology, [name] yesterday for her multifactorial anemia. She was noted to be ill appearing and his note states she has not previously had transaminitis and her renal function was mildly elevated from previous.    HPI     Home Medications Prior to Admission medications   Medication Sig Start Date End Date Taking? Authorizing Provider  aspirin EC 325 MG tablet Take 1 tablet (325 mg total) by mouth daily. 03/06/19  Yes Lelon Perla, MD  carvedilol (COREG) 12.5 MG tablet Take 1 tablet (12.5 mg total) by mouth 2 (two) times daily with a meal. 05/07/21  Yes Crenshaw, Denice Bors, MD  cetirizine (ZYRTEC) 10 MG tablet Take 10 mg by mouth daily. 03/26/21  Yes [provider]   diphenoxylate-atropine (LOMOTIL) 2.5-0.025 MG tablet TAKE 1 TABLET BY MOUTH 4 TIMES DAILY AS NEEDED FOR DIARRHEA OR  LOOSE  STOOLS Patient taking differently: Take 1 tablet by mouth 4 (four) times daily as needed for diarrhea or loose stools. 12/24/20  Yes [name], MD  esomeprazole (NEXIUM) 20 MG capsule Take 20 mg by mouth every morning.    Yes [provider]  hydrALAZINE (APRESOLINE) 25 MG tablet Take 1 tablet (25 mg total) by mouth 3 (three) times daily. 02/15/22 08/14/22 Yes Lelon Perla, MD  Insulin Human (INSULIN PUMP) SOLN Inject into the skin continuous. Novolog insulin   Yes [provider]  lamoTRIgine (LAMICTAL) 150 MG tablet Take 1 tablet by mouth daily. 01/25/22  Yes [provider]  Melatonin Gummies 2.5 MG CHEW Chew by mouth at bedtime as needed.   Yes [provider]  mirtazapine (REMERON) 30 MG tablet Take 30 mg by mouth at bedtime. 10/15/21  Yes [provider]  NOVOLOG 100 UNIT/ML injection Inject 40 Units into the skin daily. 06/08/20  Yes [provider]  Pancrelipase, Lip-Prot-Amyl, (CREON) 3000-9500 units CPEP Take 12,000 Units by mouth daily. 04/14/21  Yes [provider]  PREMARIN vaginal cream every other day. 03/24/21  Yes [provider]  thyroid (ARMOUR THYROID) 60 MG tablet Take 1 tablet by mouth daily. 07/30/21  Yes [provider]  torsemide ([name]) 20 MG tablet TAKE 1 TABLET ONCE DAILY INTHE MORNING. MAY TAKE AN  EXTRA 1/2 TABLET AS        NEEDED 01/15/18  Yes Trixie Dredge, PA-C  valACYclovir (VALTREX) 500 MG tablet Take 500 mg by mouth daily as needed (Flair up).   Yes [provider]  ACCU-CHEK GUIDE test strip 3 (three) times daily. 05/03/21   [provider]  AMBULATORY NON FORMULARY MEDICATION Incontinence pads per patient preference, #unlimited, refill [phone] Fax to [phone] 12/25/18   Emeterio Reeve, DO  Continuous Blood Gluc Sensor  (DEXCOM G6 SENSOR) MISC USE TO CONTINUOUSLY MONITOR BLOOD SUGARS. CHANGE EVERY 10 DAYS 04/02/19   [provider]      Allergies    Morphine, Penicillins, Tramadol, Acetaminophen, Amitriptyline, Codeine, Ibuprofen, Krill oil, Losartan, Propoxyphene, Shellfish allergy, Statins, Sulfamethoxazole, Sulfites, Fluconazole, and Norvasc [amlodipine besylate]    Review of Systems   Review of Systems  Constitutional:  Positive for appetite change and fatigue.  Respiratory:  Positive for shortness of breath.   Gastrointestinal:  Positive for abdominal pain.  All other systems reviewed and are negative.   Physical Exam Updated Vital Signs BP (!) 109/45   Pulse 67   Temp 97.6 F (36.4 C) (Oral)   Resp (!) 26   SpO2 98%  Physical Exam Vitals and nursing note reviewed.  Constitutional:      Appearance: Normal appearance. She is ill-appearing. She is not toxic-appearing.     Comments: Generally slowed   HENT:     Head: Normocephalic.     Comments: Eyelid swelling bilaterally Eyes:     General: No scleral icterus.    Extraocular Movements: Extraocular movements intact.  Cardiovascular:     Rate and Rhythm: Regular rhythm. Tachycardia present.     Pulses: Normal pulses.          Radial pulses are 2+ on the right side and 2+ on the left side.     Heart sounds: No murmur heard. Pulmonary:     Effort: Pulmonary effort is normal. No respiratory distress.     Breath sounds: Normal breath sounds.  Abdominal:     General: Abdomen is protuberant. Bowel sounds are normal. There is no distension.     Palpations: Abdomen is soft.     Tenderness: There is abdominal tenderness in the right upper quadrant. There is no guarding or rebound.  Musculoskeletal:     Cervical back: Neck supple.  Skin:    General: Skin is warm and dry.     Capillary Refill: Capillary refill takes less than 2 seconds.     Coloration: Skin is pale. Skin is not jaundiced.  Neurological:     General: No focal deficit  present.     Mental Status: She is alert and oriented to person, place, and time.  Psychiatric:        Mood and Affect: Mood normal.        Behavior: Behavior normal.     ED Results / Procedures / Treatments   Labs (all labs ordered are listed, but only abnormal results are displayed) Labs Reviewed  COMPREHENSIVE METABOLIC PANEL - Abnormal; Notable for the following components:      Result Value   Sodium 122 (*)    Potassium 5.8 (*)    Chloride 87 (*)    CO2 <7 (*)    Glucose, Bld 388 (*)    BUN 61 (*)    Creatinine, Ser 3.85 (*)    Calcium 8.3 (*)    Total Protein 5.7 (*)    Albumin 3.1 (*)  AST 44 (*)    ALT 129 (*)    Alkaline Phosphatase 398 (*)    Total Bilirubin 2.0 (*)    GFR, Estimated 12 (*)    All other components within normal limits  BRAIN NATRIURETIC PEPTIDE - Abnormal; Notable for the following components:   B Natriuretic Peptide 1,266.8 (*)    All other components within normal limits  CBC WITH DIFFERENTIAL/PLATELET - Abnormal; Notable for the following components:   RBC 3.60 (*)    Hemoglobin 10.1 (*)    HCT 32.9 (*)    All other components within normal limits  BLOOD GAS, VENOUS - Abnormal; Notable for the following components:   pH, Ven 7.06 (*)    pCO2, Ven <18 (*)    pO2, Ven 81 (*)    Bicarbonate 4.5 (*)    Acid-base deficit 23.8 (*)    All other components within normal limits  CBG MONITORING, ED - Abnormal; Notable for the following components:   Glucose-Capillary 376 (*)    All other components within normal limits  CBG MONITORING, ED - Abnormal; Notable for the following components:   Glucose-Capillary 394 (*)    All other components within normal limits  LIPASE, BLOOD  TSH  URINALYSIS, ROUTINE W REFLEX MICROSCOPIC  HEPATITIS PANEL, ACUTE  T4, FREE  T3, FREE  BLOOD GAS, VENOUS  BETA-HYDROXYBUTYRIC ACID  BASIC METABOLIC PANEL  BASIC METABOLIC PANEL  BASIC METABOLIC PANEL  BASIC METABOLIC PANEL  BETA-HYDROXYBUTYRIC ACID  HIV  ANTIBODY (ROUTINE TESTING W REFLEX)  ACETAMINOPHEN LEVEL  PROTIME-INR    EKG None  Radiology US Abdomen Limited RUQ (LIVER/GB)  Result Date: 03/18/2022 CLINICAL DATA:  Right upper quadrant abdominal pain for 2 days EXAM: ULTRASOUND ABDOMEN LIMITED RIGHT UPPER QUADRANT COMPARISON:  MRI 09/26/2020.  CT scan 09/18/2020 FINDINGS: Gallbladder: No gallstones or wall thickening visualized. No sonographic Murphy sign noted by sonographer. Common bile duct: Diameter: 7 mm, upper limits of normal for patient's age Liver: No focal lesion identified. Within normal limits in parenchymal echogenicity. Portal vein is patent on color Doppler imaging with normal direction of blood flow towards the liver. Other: None. IMPRESSION: No gallstones or ductal dilatation. Electronically Signed   By: Jill Side M.D.   On: 03/18/2022 19:23   DG Chest Port 1 View  Result Date: 03/18/2022 CLINICAL DATA:  Shortness of breath EXAM: PORTABLE CHEST - 1 VIEW COMPARISON:  06/19/2020 FINDINGS: Cardiomediastinal silhouette and pulmonary vasculature are within normal limits. Median sternotomy changes again seen. Right humeral fixation hardware partially visualized. Mild linear opacity at the left lateral lung base likely due to atelectasis. Lungs otherwise clear. IMPRESSION: No acute cardiopulmonary process. Electronically Signed   By: Miachel Roux M.D.   On: 03/18/2022 18:52    Procedures .Critical Care  Performed by: Mickie Hillier, PA-C Authorized by: Mickie Hillier, PA-C   Critical care provider statement:    Critical care time (minutes):  35   Critical care time was exclusive of:  Separately billable procedures and treating other patients   Critical care was necessary to treat or prevent imminent or life-threatening deterioration of the following conditions:  Dehydration, endocrine crisis and cardiac failure   Critical care was time spent personally by me on the following activities:  Development of treatment plan with  patient or surrogate, discussions with consultants, discussions with primary provider, evaluation of patient's response to treatment, examination of patient, interpretation of cardiac output measurements, obtaining history from patient or surrogate, review of old charts, re-evaluation of  patient's condition, pulse oximetry, ordering and review of radiographic studies, ordering and review of laboratory studies and ordering and performing treatments and interventions   I assumed direction of critical care for this patient from another provider in my specialty: no     Care discussed with: admitting provider      Medications Ordered in ED Medications  insulin regular, human (MYXREDLIN) 100 units/ 100 mL infusion (4.6 Units/hr Intravenous New Bag/Given 03/18/22 2156)  lactated ringers infusion (has no administration in time range)  dextrose 5 % in lactated ringers infusion (has no administration in time range)  dextrose 50 % solution 0-50 mL (has no administration in time range)  lactated ringers bolus 1,024 mL (1,000 mLs Intravenous New Bag/Given 03/18/22 2123)    ED Course/ Medical Decision Making/ A&P    Medical Decision Making Amount and/or Complexity of Data Reviewed Labs: ordered. Radiology: ordered.  Risk Prescription drug management. Decision regarding hospitalization.  Initial Impression and Ddx 67 year old female who presents to the emergency department with fatigue and abnormal labs. Patient PMH that increases complexity of ED encounter: erythropoietin deficiency anemia, IDA, CHF, CKD, CAD, diabetes, fibromyalgia, GERD Differential: Broad but includes heart failure, sepsis, anemia, viral illness, DKA, electrolyte dysfunction, etc.  Interpretation of Diagnostics I independent reviewed and interpreted the labs as followed: pH of 7.06, CMP with sodium of 122, K of 5.8, CO2 less than 7, glucose of 388, anion gap not calculable.  She also has transaminitis.  Creatinine is 3.85 and  rising, BUN is elevated, chloride is low at 87.  BNP 1200  - I independently visualized the following imaging with scope of interpretation limited to determining acute life threatening conditions related to emergency care: ultrasound right upper quadrant, which revealed no acute findings, chest x-ray negative  Patient Reassessment and Ultimate Disposition/Management This is a ill-appearing 67 year old female who presents to the emergency department with fatigue and abnormal labs. She is ill-appearing, slightly tachypneic, she has facial edema, right upper quadrant abdominal pain.   Labs from yesterday's emergency department visit showed that she had hyponatremia, anemia and felt that she should be admitted. I repeated labs today which showed that she has ongoing hyponatremia to 122.  It does appear that she is in DKA with anion calculable anion gap, metabolic acidosis, glucose of 388. Added on VBG, BHB - pH 7, BHB pending.  - she was started on an insulin gtt and ivf   Given her fatigue I added on a TSH which was normal.  Additionally, appears dry with continuing elevated Cr and BUN as well as chloride of 87. IVF from DKA order set.  BNP 1266. She is unaware of having CHF although she takes diuretics. Likely in CHF exacerbation as well.   LFT elevation - RUQ [place] is negative. Could be from congestion? Hepatitis panel was added on.   Consulted and spoke with Dr. Flossie Buffy, hospitalist who agrees to admit the patient. Patient is agreeable to plan.   Patient management required discussion with the following services or consulting groups:  Hospitalist Service  Complexity of Problems Addressed Acute illness or injury that poses threat of life of bodily function  Additional Data Reviewed and Analyzed Further history obtained from: Further history from spouse/family member, Past medical history and medications listed in the EMR, Prior ED visit notes, and Care Everywhere  Patient Encounter Risk  Assessment Consideration of hospitalization  Final Clinical Impression(s) / ED Diagnoses Final diagnoses:  Diabetic ketoacidosis without coma associated with type 1 diabetes mellitus ([place])  Acute  on chronic congestive heart failure, unspecified heart failure type [facility])    Rx / DC Orders ED Discharge Orders     None         Mickie Hillier, PA-C 03/18/22 2219    Deno Etienne, DO 03/18/22 2235

## 2022-03-18 NOTE — ED Triage Notes (Signed)
Pt with multiple chronic health issues went to [facility] yesterday after receiving abnormal lab results.  Pt stated her liver and kidney labs were elevated.  Worsening fatigue.  Headache.  Was admitted to [facility] and had a bed assignment but elected to leave prior to transport to [facility] from [facility].  Pt comes back today d/t not feeling any better.

## 2022-03-18 NOTE — Assessment & Plan Note (Signed)
-  corrected Na for hyperglycemia to 127 -continue to follow sodium with DKA correction

## 2022-03-18 NOTE — Assessment & Plan Note (Addendum)
-  Hx of positive hepatitis B antigen in the past and saw [facility] in 11/2021- had labs at that time with Hep B surface antigen, antibody, IgM, HCV antibody all negative/non-reactive -LFTs continue to be elevated but is downward trending.   - AST of 79 -->44.   - ALT of 171 -->129  - Alkaline phosphatase of 497 -->398 -Denies alcohol or illicit drug. No tylenol use. No recent travel -RUQ ultrasound is negative.  -repeat acute hepatitis panel -check Tylenol level, Lamictal level since she is taking for depression -check PT/INR - Will trend LFTs in the morning. However with her transaminitis, elevated BNP and sensation of facial/abdominal edema most concerning for cirrhosis. Will need GI consult in the morning for further workup.

## 2022-03-18 NOTE — Assessment & Plan Note (Signed)
-  BP on the softer side. Continue Coreg but will hold hydralazine and [place] while she is dehydrated with DKA.

## 2022-03-18 NOTE — Assessment & Plan Note (Signed)
Creatinine has worsened to 3.85 from prior of 2.78 with BUN of 61. 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 Check renal ultrasound. Follow intake and output. Most likely due to dehydration from DKA -Follow creatinine daily -avoid nephrotoxic agent or contrasted studies

## 2022-03-18 NOTE — Assessment & Plan Note (Addendum)
-  continue Lamictal. Check level.

## 2022-03-19 ENCOUNTER — Inpatient Hospital Stay (HOSPITAL_COMMUNITY): Payer: Medicare Other

## 2022-03-19 ENCOUNTER — Encounter (HOSPITAL_COMMUNITY): Payer: Self-pay | Admitting: Family Medicine

## 2022-03-19 DIAGNOSIS — I10 Essential (primary) hypertension: Secondary | ICD-10-CM

## 2022-03-19 DIAGNOSIS — I2510 Atherosclerotic heart disease of native coronary artery without angina pectoris: Secondary | ICD-10-CM

## 2022-03-19 DIAGNOSIS — E1010 Type 1 diabetes mellitus with ketoacidosis without coma: Secondary | ICD-10-CM | POA: Diagnosis not present

## 2022-03-19 LAB — BLOOD GAS, VENOUS
Acid-base deficit: 7.6 mmol/L — ABNORMAL HIGH (ref 0.0–2.0)
Bicarbonate: 16 mmol/L — ABNORMAL LOW (ref 20.0–28.0)
Drawn by: 7020
O2 Saturation: 95.8 %
Patient temperature: 36.7
pCO2, Ven: 27 mmHg — ABNORMAL LOW (ref 44–60)
pH, Ven: 7.38 (ref 7.25–7.43)
pO2, Ven: 71 mmHg — ABNORMAL HIGH (ref 32–45)

## 2022-03-19 LAB — GLUCOSE, CAPILLARY
Glucose-Capillary: 127 mg/dL — ABNORMAL HIGH (ref 70–99)
Glucose-Capillary: 136 mg/dL — ABNORMAL HIGH (ref 70–99)
Glucose-Capillary: 140 mg/dL — ABNORMAL HIGH (ref 70–99)
Glucose-Capillary: 141 mg/dL — ABNORMAL HIGH (ref 70–99)
Glucose-Capillary: 148 mg/dL — ABNORMAL HIGH (ref 70–99)
Glucose-Capillary: 160 mg/dL — ABNORMAL HIGH (ref 70–99)
Glucose-Capillary: 162 mg/dL — ABNORMAL HIGH (ref 70–99)
Glucose-Capillary: 163 mg/dL — ABNORMAL HIGH (ref 70–99)
Glucose-Capillary: 170 mg/dL — ABNORMAL HIGH (ref 70–99)
Glucose-Capillary: 178 mg/dL — ABNORMAL HIGH (ref 70–99)
Glucose-Capillary: 182 mg/dL — ABNORMAL HIGH (ref 70–99)
Glucose-Capillary: 185 mg/dL — ABNORMAL HIGH (ref 70–99)

## 2022-03-19 LAB — BASIC METABOLIC PANEL
Anion gap: >20 — ABNORMAL HIGH (ref 5–15)
Anion gap: 19 — ABNORMAL HIGH (ref 5–15)
Anion gap: 16 — ABNORMAL HIGH (ref 5–15)
Anion gap: 12 (ref 5–15)
Anion gap: 13 (ref 5–15)
BUN: 72 mg/dL — ABNORMAL HIGH (ref 8–23)
BUN: 70 mg/dL — ABNORMAL HIGH (ref 8–23)
BUN: 67 mg/dL — ABNORMAL HIGH (ref 8–23)
BUN: 67 mg/dL — ABNORMAL HIGH (ref 8–23)
BUN: 68 mg/dL — ABNORMAL HIGH (ref 8–23)
BUN: 65 mg/dL — ABNORMAL HIGH (ref 8–23)
CO2: <7 mmol/L — ABNORMAL LOW (ref 22–32)
CO2: 12 mmol/L — ABNORMAL LOW (ref 22–32)
CO2: 13 mmol/L — ABNORMAL LOW (ref 22–32)
CO2: 15 mmol/L — ABNORMAL LOW (ref 22–32)
CO2: 15 mmol/L — ABNORMAL LOW (ref 22–32)
CO2: 17 mmol/L — ABNORMAL LOW (ref 22–32)
Calcium: 8.1 mg/dL — ABNORMAL LOW (ref 8.9–10.3)
Calcium: 8 mg/dL — ABNORMAL LOW (ref 8.9–10.3)
Calcium: 8 mg/dL — ABNORMAL LOW (ref 8.9–10.3)
Calcium: 8 mg/dL — ABNORMAL LOW (ref 8.9–10.3)
Calcium: 7.8 mg/dL — ABNORMAL LOW (ref 8.9–10.3)
Calcium: 8 mg/dL — ABNORMAL LOW (ref 8.9–10.3)
Chloride: 88 mmol/L — ABNORMAL LOW (ref 98–111)
Chloride: 88 mmol/L — ABNORMAL LOW (ref 98–111)
Chloride: 90 mmol/L — ABNORMAL LOW (ref 98–111)
Chloride: 91 mmol/L — ABNORMAL LOW (ref 98–111)
Chloride: 94 mmol/L — ABNORMAL LOW (ref 98–111)
Chloride: 92 mmol/L — ABNORMAL LOW (ref 98–111)
Creatinine, Ser: 4.02 mg/dL — ABNORMAL HIGH (ref 0.44–1.00)
Creatinine, Ser: 3.84 mg/dL — ABNORMAL HIGH (ref 0.44–1.00)
Creatinine, Ser: 3.73 mg/dL — ABNORMAL HIGH (ref 0.44–1.00)
Creatinine, Ser: 3.52 mg/dL — ABNORMAL HIGH (ref 0.44–1.00)
Creatinine, Ser: 3.34 mg/dL — ABNORMAL HIGH (ref 0.44–1.00)
Creatinine, Ser: 3.25 mg/dL — ABNORMAL HIGH (ref 0.44–1.00)
GFR, Estimated: 12 mL/min — ABNORMAL LOW (ref >60)
GFR, Estimated: 12 mL/min — ABNORMAL LOW (ref >60)
GFR, Estimated: 13 mL/min — ABNORMAL LOW (ref >60)
GFR, Estimated: 14 mL/min — ABNORMAL LOW (ref >60)
GFR, Estimated: 15 mL/min — ABNORMAL LOW (ref >60)
GFR, Estimated: 15 mL/min — ABNORMAL LOW (ref >60)
Glucose, Bld: 401 mg/dL — ABNORMAL HIGH (ref 70–99)
Glucose, Bld: 220 mg/dL — ABNORMAL HIGH (ref 70–99)
Glucose, Bld: 209 mg/dL — ABNORMAL HIGH (ref 70–99)
Glucose, Bld: 179 mg/dL — ABNORMAL HIGH (ref 70–99)
Glucose, Bld: 176 mg/dL — ABNORMAL HIGH (ref 70–99)
Glucose, Bld: 139 mg/dL — ABNORMAL HIGH (ref 70–99)
Potassium: 5.3 mmol/L — ABNORMAL HIGH (ref 3.5–5.1)
Potassium: 4.6 mmol/L (ref 3.5–5.1)
Potassium: 4.6 mmol/L (ref 3.5–5.1)
Potassium: 4.6 mmol/L (ref 3.5–5.1)
Potassium: 4.4 mmol/L (ref 3.5–5.1)
Potassium: 4.2 mmol/L (ref 3.5–5.1)
Sodium: 122 mmol/L — ABNORMAL LOW (ref 135–145)
Sodium: 121 mmol/L — ABNORMAL LOW (ref 135–145)
Sodium: 122 mmol/L — ABNORMAL LOW (ref 135–145)
Sodium: 122 mmol/L — ABNORMAL LOW (ref 135–145)
Sodium: 121 mmol/L — ABNORMAL LOW (ref 135–145)
Sodium: 122 mmol/L — ABNORMAL LOW (ref 135–145)

## 2022-03-19 LAB — URINALYSIS, ROUTINE W REFLEX MICROSCOPIC
Bilirubin Urine: NEGATIVE
Glucose, UA: 50 mg/dL — AB
Ketones, ur: 5 mg/dL — AB
Nitrite: NEGATIVE
Protein, ur: 100 mg/dL — AB
Specific Gravity, Urine: 1.011 (ref 1.005–1.030)
pH: 5 (ref 5.0–8.0)

## 2022-03-19 LAB — ECHOCARDIOGRAM COMPLETE
AR max vel: 2.64 cm2
AV Area VTI: 2.37 cm2
AV Area mean vel: 2.4 cm2
AV Mean grad: 5 mmHg
AV Peak grad: 9.5 mmHg
Ao pk vel: 1.55 m/s
Area-P 1/2: 3.85 cm2
Height: 58 in
MV M vel: 2.46 m/s
MV Peak grad: 24.2 mmHg
S' Lateral: 2.5 cm
Single Plane A4C EF: 68.4 %
Weight: 1880.08 oz

## 2022-03-19 LAB — HEPATIC FUNCTION PANEL
ALT: 113 U/L — ABNORMAL HIGH (ref 0–44)
AST: 39 U/L (ref 15–41)
Albumin: 2.8 g/dL — ABNORMAL LOW (ref 3.5–5.0)
Alkaline Phosphatase: 348 U/L — ABNORMAL HIGH (ref 38–126)
Bilirubin, Direct: 0.1 mg/dL (ref 0.0–0.2)
Indirect Bilirubin: 1.2 mg/dL — ABNORMAL HIGH (ref 0.3–0.9)
Total Bilirubin: 1.3 mg/dL — ABNORMAL HIGH (ref 0.3–1.2)
Total Protein: 5.5 g/dL — ABNORMAL LOW (ref 6.5–8.1)

## 2022-03-19 LAB — HEPATITIS PANEL, ACUTE
HCV Ab: NONREACTIVE
Hep A IgM: NONREACTIVE
Hep B C IgM: NONREACTIVE
Hepatitis B Surface Ag: NONREACTIVE

## 2022-03-19 LAB — CBG MONITORING, ED
Glucose-Capillary: 180 mg/dL — ABNORMAL HIGH (ref 70–99)
Glucose-Capillary: 210 mg/dL — ABNORMAL HIGH (ref 70–99)
Glucose-Capillary: 215 mg/dL — ABNORMAL HIGH (ref 70–99)
Glucose-Capillary: 220 mg/dL — ABNORMAL HIGH (ref 70–99)
Glucose-Capillary: 223 mg/dL — ABNORMAL HIGH (ref 70–99)
Glucose-Capillary: 228 mg/dL — ABNORMAL HIGH (ref 70–99)
Glucose-Capillary: 261 mg/dL — ABNORMAL HIGH (ref 70–99)
Glucose-Capillary: 292 mg/dL — ABNORMAL HIGH (ref 70–99)
Glucose-Capillary: 314 mg/dL — ABNORMAL HIGH (ref 70–99)
Glucose-Capillary: 358 mg/dL — ABNORMAL HIGH (ref 70–99)
Glucose-Capillary: 386 mg/dL — ABNORMAL HIGH (ref 70–99)

## 2022-03-19 LAB — BETA-HYDROXYBUTYRIC ACID
Beta-Hydroxybutyric Acid: 6.36 mmol/L — ABNORMAL HIGH (ref 0.05–0.27)
Beta-Hydroxybutyric Acid: 2.51 mmol/L — ABNORMAL HIGH (ref 0.05–0.27)
Beta-Hydroxybutyric Acid: 1.09 mmol/L — ABNORMAL HIGH (ref 0.05–0.27)

## 2022-03-19 LAB — PROTIME-INR
INR: 1.1 (ref 0.8–1.2)
Prothrombin Time: 14.3 seconds (ref 11.4–15.2)

## 2022-03-19 LAB — T4, FREE: Free T4: 0.83 ng/dL (ref 0.61–1.12)

## 2022-03-19 LAB — HIV ANTIBODY (ROUTINE TESTING W REFLEX): HIV Screen 4th Generation wRfx: NONREACTIVE

## 2022-03-19 LAB — MRSA NEXT GEN BY PCR, NASAL: MRSA by PCR Next Gen: NOT DETECTED

## 2022-03-19 LAB — ACETAMINOPHEN LEVEL: Acetaminophen (Tylenol), Serum: <10 ug/mL — ABNORMAL LOW (ref 10–30)

## 2022-03-19 MED ORDER — ORAL CARE MOUTH RINSE
15.0000 mL | OROMUCOSAL | Status: DC | PRN
  Administered 2022-03-19: 15 mL via OROMUCOSAL

## 2022-03-19 MED ORDER — SODIUM CHLORIDE 0.9 % IV SOLN: INTRAVENOUS | Status: DC | PRN

## 2022-03-19 MED ORDER — HYDRALAZINE HCL 20 MG/ML IJ SOLN
5.0000 mg | INTRAMUSCULAR | Status: DC | PRN
  Administered 2022-03-19: 5 mg via INTRAVENOUS
  Filled 2022-03-19: qty 1

## 2022-03-19 MED ORDER — CHLORHEXIDINE GLUCONATE CLOTH 2 % EX PADS
6.0000 | MEDICATED_PAD | Freq: Every day | CUTANEOUS | Status: DC
  Administered 2022-03-19: 6 via TOPICAL

## 2022-03-19 MED ORDER — DM-GUAIFENESIN ER 30-600 MG PO TB12
1.0000 | ORAL_TABLET | Freq: Two times a day (BID) | ORAL | Status: DC | PRN
  Administered 2022-03-19: 1 via ORAL
  Filled 2022-03-19: qty 1

## 2022-03-19 NOTE — Progress Notes (Signed)
PROGRESS NOTE    Brittney Pace  [id number] DOB: 1955/09/04 DOA: 03/18/2022 PCP: [name], MD     Brief Narrative:  Brittney Pace is a 67 y.o. 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    Patient was seen by [name] in office visit on 2/22.  At that time, she was appearing unwell, blood work revealed hyponatremia, AKI, elevated liver enzymes.  She was referred to be evaluated in the ED.  She was admitted to [facility] and awaiting transfer.  However, patient did not want to wait and signed out [place] prior to transferring to [facility].  She returned to [facility] on 2/23.  Continues to feel fatigued, weak.  She was found to be in DKA with hyponatremia, hyperkalemia, metabolic acidosis with elevated anion gap, elevated LFTs.  Patient was started on IV fluid and IV insulin.  New events last 24 hours / Subjective: Patient seen in the emergency department.  Admits to feeling flushed, fever at home.  Has been afebrile here.  Has not been feeling well for about a month.  Denies decreased p.o. intake.  Assessment & Plan:   Principal Problem:   DKA, type 1 ([place]) Active Problems:   CAD (coronary artery disease)   Anemia due to stage 3 chronic kidney disease (HCC)   Depression   Acquired hypothyroidism   Transaminitis   Hyponatremia   Hyperkalemia   Acute kidney injury superimposed on chronic kidney disease (HCC)   HTN (hypertension)   DKA -Continue insulin drip, IV fluid -Continue to monitor BMP and beta hydroxy until anion gap closes  AKI on CKD stage 4 -Baseline creatinine 2.3 -Followed by nephrology at [facility].  Most recent progress note reviewed today -Renal ultrasound consistent with chronic medical renal disease, small  simple cyst right kidney, can follow-up with outpatient MRI -Continue IV fluid  Elevated liver enzymes -Previously referred to GI due to positive hepatitis B antigen.  Most recent progress note reviewed today.  Repeat hepatitis panel has been negative. -Abdominal ultrasound unremarkable for cirrhosis or fatty liver -Elevated liver enzymes may be secondary to dehydration versus medication use versus congestive hepatopathy in setting of elevated BNP -LFTs are improving  Hyponatremia -Insetting of DKA  Elevated BNP -Obtain echocardiogram  Hypothyroidism -[place] Thyroid  Depression -Lamictal  Anemia of chronic disease -Followed by hematology, [name]  CAD status post CABG -Stable.  Aspirin, Coreg   DVT prophylaxis:  SCDs Start: 03/18/22 2205  Code Status: Full code Family Communication: No family at bedside, updated husband over the phone today Disposition Plan:  Status is: Inpatient Remains inpatient appropriate because: IV fluid, IV insulin   Antimicrobials:  Anti-infectives (From admission, onward)    None        Objective: Vitals:   03/19/22 1215 03/19/22 1230 03/19/22 1245 03/19/22 1300  BP: (!) 166/67 (!) 158/69 (!) 162/59 (!) 155/83  Pulse: 67 65 66 65  Resp: '17 14 19 14  '$ Temp: 97.7 F (36.5 C)     TempSrc: Oral     SpO2: 96% 99% 98% 97%  Weight: 53.3 kg     Height: '4\' 10"'$  (1.473 m)       Intake/Output Summary (Last 24 hours) at 03/19/2022 1328 Last data filed at 03/19/2022 1200 Gross per 24 hour  Intake 2560.28 ml  Output --  Net 2560.28 ml   Filed Weights   03/19/22 1215  Weight: 53.3 kg    Examination:  General exam: Appears calm and comfortable  Respiratory system: Clear to auscultation. Respiratory effort normal. No respiratory distress. No conversational dyspnea.  Cardiovascular system: S1 & S2 heard, RRR. No murmurs. No pedal edema. Gastrointestinal system: Abdomen is nondistended, soft and nontender. Normal bowel sounds  heard. Central nervous system: Alert and oriented. No focal neurological deficits. Speech clear.  Extremities: Symmetric in appearance  Skin: No rashes, lesions or ulcers on exposed skin  Psychiatry: Judgement and insight appear normal. Mood & affect appropriate.   Data Reviewed: I have personally reviewed following labs and imaging studies  CBC: Recent Labs  Lab 03/17/22 1445 03/17/22 1928 03/18/22 1910  WBC 5.9 7.4 6.6  NEUTROABS 3.8  --  5.3  HGB 9.5* 9.5* 10.1*  HCT 28.9* 28.0* 32.9*  MCV 83.8 82.1 91.4  PLT 216 223 [id number]   Basic Metabolic Panel: Recent Labs  Lab 03/18/22 1910 03/18/22 2130 03/19/22 0109 03/19/22 0538 03/19/22 0845  NA 122* 123* 122* 121* 122*  K 5.8* 5.3* 5.3* 4.6 4.6  CL 87* 93* 88* 88* 90*  CO2 <7* <7* <7* 12* 13*  GLUCOSE 388* 370* 401* 220* 209*  BUN 61* 63* 72* 70* 67*  CREATININE 3.85* 3.00* 4.02* 3.84* 3.73*  CALCIUM 8.3* 7.4* 8.1* 8.0* 8.0*   GFR: Estimated Creatinine Clearance: 10.6 mL/min (A) (by C-G formula based on SCr of 3.73 mg/dL (H)). Liver Function Tests: Recent Labs  Lab 03/17/22 1445 03/17/22 1928 03/18/22 1910 03/19/22 0845  AST 85* 79* 44* 39  ALT 179* 171* 129* 113*  ALKPHOS 518* 497* 398* 348*  BILITOT 0.6 1.1 2.0* 1.3*  PROT 6.3* 6.2* 5.7* 5.5*  ALBUMIN 3.8 3.2* 3.1* 2.8*   Recent Labs  Lab 03/18/22 1910  LIPASE 28   No results for input(s): "AMMONIA" in the last 168 hours. Coagulation Profile: Recent Labs  Lab 03/19/22 0108  INR 1.1   Cardiac Enzymes: No results for input(s): "CKTOTAL", "CKMB", "CKMBINDEX", "TROPONINI" in the last 168 hours. BNP (last 3 results) No results for input(s): "PROBNP" in the last 8760 hours. HbA1C: No results for input(s): "HGBA1C" in the last 72 hours. CBG: Recent Labs  Lab 03/19/22 0830 03/19/22 0931 03/19/22 1109 03/19/22 1217 03/19/22 1315  GLUCAP 215* 210* 180* 185* 182*   Lipid Profile: No results for input(s): "CHOL", "HDL", "LDLCALC", "TRIG", "CHOLHDL",  "LDLDIRECT" in the last 72 hours. Thyroid Function Tests: Recent Labs    03/18/22 1910  TSH 1.865  FREET4 0.83   Anemia Panel: Recent Labs    03/17/22 1445  FERRITIN 118  TIBC 274  IRON 43  RETICCTPCT 0.6   Sepsis Labs: No results for input(s): "PROCALCITON", "LATICACIDVEN" in the last 168 hours.  Recent Results (from the past 240 hour(s))  Resp panel by RT-PCR (RSV, Flu A&B, Covid) Nasopharyngeal Swab     Status: None   Collection Time: 03/17/22  9:09 PM   Specimen: Nasopharyngeal Swab; Nasal Swab  Result Value Ref Range Status   SARS Coronavirus 2 by RT PCR NEGATIVE NEGATIVE Final    Comment: (NOTE) SARS-CoV-2 target nucleic acids are NOT DETECTED.  The SARS-CoV-2 RNA is generally detectable in upper respiratory specimens during the acute phase of infection. The lowest concentration of SARS-CoV-2 viral copies this assay can detect is 138 copies/mL. A negative result does not preclude SARS-Cov-2 infection and should not be used as the sole basis for  treatment or other patient management decisions. A negative result may occur with  improper specimen collection/handling, submission of specimen other than nasopharyngeal swab, presence of viral mutation(s) within the areas targeted by this assay, and inadequate number of viral copies(<138 copies/mL). A negative result must be combined with clinical observations, patient history, and epidemiological information. The expected result is Negative.  Fact Sheet for Patients:  EntrepreneurPulse.com.au  Fact Sheet for Healthcare Providers:  IncredibleEmployment.be  This test is no t yet approved or cleared by the [place] FDA and  has been authorized for detection and/or diagnosis of SARS-CoV-2 by FDA under an Emergency Use Authorization (EUA). This EUA will remain  in effect (meaning this test can be used) for the duration of the COVID-19 declaration under Section 564(b)(1) of the Act,  21 U.S.C.section 360bbb-3(b)(1), unless the authorization is terminated  or revoked sooner.       Influenza A by PCR NEGATIVE NEGATIVE Final   Influenza B by PCR NEGATIVE NEGATIVE Final    Comment: (NOTE) The Xpert Xpress SARS-CoV-2/FLU/RSV plus assay is intended as an aid in the diagnosis of influenza from Nasopharyngeal swab specimens and should not be used as a sole basis for treatment. Nasal washings and aspirates are unacceptable for Xpert Xpress SARS-CoV-2/FLU/RSV testing.  Fact Sheet for Patients: EntrepreneurPulse.com.au  Fact Sheet for Healthcare Providers: IncredibleEmployment.be  This test is not yet approved or cleared by the [place] FDA and has been authorized for detection and/or diagnosis of SARS-CoV-2 by FDA under an Emergency Use Authorization (EUA). This EUA will remain in effect (meaning this test can be used) for the duration of the COVID-19 declaration under Section 564(b)(1) of the Act, 21 U.S.C. section 360bbb-3(b)(1), unless the authorization is terminated or revoked.     Resp Syncytial Virus by PCR NEGATIVE NEGATIVE Final    Comment: (NOTE) Fact Sheet for Patients: EntrepreneurPulse.com.au  Fact Sheet for Healthcare Providers: IncredibleEmployment.be  This test is not yet approved or cleared by the [place] FDA and has been authorized for detection and/or diagnosis of SARS-CoV-2 by FDA under an Emergency Use Authorization (EUA). This EUA will remain in effect (meaning this test can be used) for the duration of the COVID-19 declaration under Section 564(b)(1) of the Act, 21 U.S.C. section 360bbb-3(b)(1), unless the authorization is terminated or revoked.  Performed at [facility], [address], [place], [place] [address]       Radiology Studies: US RENAL  Result Date: 03/18/2022 CLINICAL DATA:  Urinary retention. EXAM: RENAL / URINARY TRACT  ULTRASOUND COMPLETE COMPARISON:  Abdominal MRI 09/26/2020, CT 09/18/2020 FINDINGS: Right Kidney: Renal measurements: 9.5 x 4.3 x 5 cm = volume: 108 mL. No hydronephrosis. Diffusely increased renal parenchymal echogenicity in thinning of the renal parenchyma. Simple cyst in the mid right kidney measures 1.2 cm. Complex cyst in the medial kidney measures 1.4 x 1.2 x 1.2 cm. Trace perinephric fluid. No evidence of renal calculi. Left Kidney: Renal measurements: 10.2 x 4.5 x 4.8 cm = volume: 115 mL. No hydronephrosis. Increased renal parenchymal echogenicity in thinning of the renal parenchyma. Simple cyst on prior MRI are not well demonstrated. No evidence of solid lesion. No visible renal stone. Trace perinephric fluid. Bladder: Mild diffuse bladder wall thickening at 5 mm. Moderate bladder distension. Both ureteral jets are demonstrated. Other: None. IMPRESSION: 1. No obstructive uropathy. 2. Increased bilateral renal parenchymal echogenicity typical of chronic medical renal disease. Trace bilateral perinephric fluid is likely chronic. 3. Small simple cysts in the right  kidney. Septated/complex right renal cyst on prior MRI is difficult to delineate by ultrasound. MRI follow-up is recommended on prior abdominal MRI, recommend this be performed on an elective basis in the near future not performed elsewhere. 4. Mild diffuse bladder wall thickening. Moderate bladder distension. Electronically Signed   By: Keith Rake M.D.   On: 03/18/2022 22:59   US Abdomen Limited RUQ (LIVER/GB)  Result Date: 03/18/2022 CLINICAL DATA:  Right upper quadrant abdominal pain for 2 days EXAM: ULTRASOUND ABDOMEN LIMITED RIGHT UPPER QUADRANT COMPARISON:  MRI 09/26/2020.  CT scan 09/18/2020 FINDINGS: Gallbladder: No gallstones or wall thickening visualized. No sonographic Murphy sign noted by sonographer. Common bile duct: Diameter: 7 mm, upper limits of normal for patient's age Liver: No focal lesion identified. Within normal limits  in parenchymal echogenicity. Portal vein is patent on color Doppler imaging with normal direction of blood flow towards the liver. Other: None. IMPRESSION: No gallstones or ductal dilatation. Electronically Signed   By: Jill Side M.D.   On: 03/18/2022 19:23   DG Chest Port 1 View  Result Date: 03/18/2022 CLINICAL DATA:  Shortness of breath EXAM: PORTABLE CHEST - 1 VIEW COMPARISON:  06/19/2020 FINDINGS: Cardiomediastinal silhouette and pulmonary vasculature are within normal limits. Median sternotomy changes again seen. Right humeral fixation hardware partially visualized. Mild linear opacity at the left lateral lung base likely due to atelectasis. Lungs otherwise clear. IMPRESSION: No acute cardiopulmonary process. Electronically Signed   By: Miachel Roux M.D.   On: 03/18/2022 18:52      Scheduled Meds:  aspirin EC  325 mg Oral Daily   carvedilol  12.5 mg Oral BID WC   lamoTRIgine  150 mg Oral Daily   lipase/protease/amylase  12,000 Units Oral Daily   loratadine  10 mg Oral Daily   mirtazapine  30 mg Oral QHS   pantoprazole  40 mg Oral Daily   thyroid  60 mg Oral Q0600   Continuous Infusions:  dextrose 5% lactated ringers Stopped (03/19/22 1159)   insulin 3.6 Units/hr (03/19/22 1219)   lactated ringers Stopped (03/19/22 [id number])     LOS: 1 day   Time spent: 35 minutes   Dessa Phi, DO [facility] 03/19/2022, 1:28 PM   Available via Epic secure chat 7am-7pm After these hours, please refer to coverage provider listed on [email or web address]

## 2022-03-19 NOTE — Inpatient Diabetes Management (Addendum)
Inpatient Diabetes Program Recommendations  AACE/ADA: New Consensus Statement on Inpatient Glycemic Control (2015)  Target Ranges:  Prepandial:   less than 140 mg/dL      Peak postprandial:   less than 180 mg/dL (1-2 hours)      Critically ill patients:  140 - 180 mg/dL   Lab Results  Component Value Date   GLUCAP 215 (H) 03/19/2022   HGBA1C 9.6 (H) 12/24/2020    Review of Glycemic Control  Latest Reference Range & Units 03/19/22 06:07 03/19/22 07:28 03/19/22 08:30  Glucose-Capillary 70 - 99 mg/dL 223 (H) 228 (H) 215 (H)  (H): Data is abnormally high Diabetes history: Type 1 DM Outpatient Diabetes medications: T Slim with Novolog (settings below), if pump failure: Lantus 6 units QD, Novolog 2 units TID Current orders for Inpatient glycemic control: IV insulin  Inpatient Diabetes Program Recommendations:    Continue with IV insulin given DKA.   Last A1C from 12/2021- 7.6% Patient is followed by [facility] for outpatient endocrinology.  Current insulin pump settings are as follows: Basal insulin  0000-0400 0.2 units/hour 0401-0900 0.1 units/hour 0901-1200 0.11 units/hour 1201-1900 0.36 units/hour 1901-0000 0.33 units/hour  Total daily basal insulin: 5.8 units/24 hours  Carb Coverage 0000-1900 1:16 1 unit for every 16 grams of carbohydrates  Insulin Sensitivity 1:110 1 unit drops blood glucose 110 mg/dl  Attempted to reach patient by phone; no answer.   Thanks, Bronson Curb, MSN, RNC-OB Diabetes Coordinator [phone] (8a-5p)

## 2022-03-19 NOTE — ED Notes (Signed)
ED TO INPATIENT HANDOFF REPORT  ED Nurse Name and Phone #: Waynette Buttery Name/Age/Gender Brittney Pace 67 y.o. female Room/Bed: WA25/WA25  Code Status   Code Status: Full Code  Home/SNF/Other Home Patient oriented to: self, place, time, and situation Is this baseline? Yes   Triage Complete: Triage complete  Chief Complaint DKA (diabetic ketoacidosis) ([place]) [E11.10]  Triage Note Pt with multiple chronic health issues went to [facility] yesterday after receiving abnormal lab results.  Pt stated her liver and kidney labs were elevated.  Worsening fatigue.  Headache.  Was admitted to [facility] and had a bed assignment but elected to leave prior to transport to [facility] from [facility].  Pt comes back today d/t not feeling any better.      Allergies Allergies  Allergen Reactions   Morphine Anaphylaxis and Shortness Of Breath   Penicillins Hives    Tolerated ANCEF on 04/30/20 Has patient had a PCN reaction causing immediate rash, facial/tongue/throat swelling, SOB or lightheadedness with hypotension: Yes Has patient had a PCN reaction causing severe rash involving mucus membranes or skin necrosis: No Has patient had a PCN reaction that required hospitalization No Has patient had a PCN reaction occurring within the last 10 years: No If all of the above answers are "NO", then may proceed with Cephalosporin use.   Tramadol Anaphylaxis   Acetaminophen Other (See Comments)    Alters insulin pump readings    Amitriptyline Other (See Comments)    Severe headache/ out of body feeling   Codeine Other (See Comments)    Severe headaches/ out of body feeling    Ibuprofen Other (See Comments)    Messes up CGM reading on glucose monitor     Krill Oil Diarrhea, Itching and Nausea And Vomiting   Losartan Cough   Propoxyphene Other (See Comments)    Severe headaches / out of body feeling    Shellfish Allergy Diarrhea and Nausea And Vomiting   Statins Other (See Comments)    Muscle pains Other  reaction(s): Other   Sulfamethoxazole Other (See Comments)    Mouth ulcers    Sulfites Itching and Other (See Comments)    Mouth ulcers   Fluconazole     Other reaction(s): Contact Dermatitis (intolerance)   Norvasc [Amlodipine Besylate] Swelling    Level of Care/Admitting Diagnosis ED Disposition     ED Disposition  Admit   Condition  --   Comment  Hospital Area: [facility] [[id number]]  Level of Care: Stepdown [14]  Admit to SDU based on following criteria: Severe physiological/psychological symptoms:  Any diagnosis requiring assessment & intervention at least every 4 hours on an ongoing basis to obtain desired patient outcomes including stability and rehabilitation  May admit patient to [facility] or [facility] if equivalent level of care is available:: No  Covid Evaluation: Asymptomatic - no recent exposure (last 10 days) testing not required  Diagnosis: DKA (diabetic ketoacidosis) [facility]) [id number]  Admitting Physician: Orene Desanctis [id number]  Attending Physician: Orene Desanctis [id number]  Certification:: I certify this patient will need inpatient services for at least 2 midnights  Estimated Length of Stay: 3          B Medical/Surgery History Past Medical History:  Diagnosis Date   Anemia    Arthritis    Babesiasis    secondary due to lyme disease   CHF (congestive heart failure) ([place])    Chronic kidney disease    stage 3   Coronary artery disease  Depression    Diabetes mellitus without complication ([place])    Type 1   Diabetic retinopathy ([place])    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   Family history of adverse reaction to anesthesia    mother: " while she was under she stopped breathing."   Fibromyalgia    Gastroparesis    GERD (gastroesophageal reflux disease)    Headache    migraines   Hypothyroidism    IBS (irritable bowel syndrome)    Idiopathic edema    Iron deficiency anemia 09/04/2019   Lyme disease    Mitral valve  prolapse    Myocardial infarction ([place])    1 major in 1999 and 2 minor " small vessel disease."   Osteoporosis    Peripheral neuropathy    Peripheral vascular disease (H[facility])    Sinus disorder    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    Past Surgical History:  Procedure Laterality Date   ABDOMINAL HYSTERECTOMY     APPENDECTOMY     BREAST SURGERY     B/L biopsy and lumpectomy    CARDIAC CATHETERIZATION     CARPAL TUNNEL RELEASE     CATARACT EXTRACTION W/ INTRAOCULAR LENS IMPLANT     right eye   COLONOSCOPY W/ BIOPSIES AND POLYPECTOMY     CORONARY ARTERY BYPASS GRAFT     coronary artery stents     at [place] and [place]   ECTOPIC PREGNANCY SURGERY     NASAL SEPTUM SURGERY     OPEN REDUCTION INTERNAL FIXATION (ORIF) DISTAL RADIAL FRACTURE Right 05/06/2015   Procedure: OPEN REDUCTION INTERNAL FIXATION (ORIF) RIGHT DISTAL RADIAL FRACTURE AND REPAIRS AS NEEDED;  Surgeon: Iran Planas, MD;  Location: [facility];  Service: Orthopedics;  Laterality: Right;   ORIF HUMERUS FRACTURE Right 04/30/2020   Procedure: OPEN REDUCTION INTERNAL FIXATION (ORIF) PROXIMAL HUMERUS FRACTURE;  Surgeon: Justice Britain, MD;  Location: [facility];  Service: Orthopedics;  Laterality: Right;  166mn   ORIF WRIST FRACTURE Left 11/05/2014   ORIF WRIST FRACTURE Left 11/05/2014   Procedure: OPEN REDUCTION INTERNAL FIXATION (ORIF) LEFT WRIST FRACTURE AND REPAIR AS INDICATED;  Surgeon: FIran Planas MD;  [place]  Service: Orthopedics;  Laterality: Left;   TRIGGER FINGER RELEASE       A IV Location/Drains/Wounds Patient Lines/Drains/Airways Status     Active Line/Drains/Airways     Name Placement date Placement time Site Days   Peripheral IV 03/18/22 20 G Anterior;Right Forearm 03/18/22  1913  Forearm  1   Peripheral IV 03/18/22 20 G Left Antecubital 03/18/22  2319  Antecubital  1            Intake/Output Last 24  hours  Intake/Output Summary (Last 24 hours) at 03/19/2022 1117 Last data filed at 03/19/2022 0158 Gross per 24 hour  Intake 1024 ml  Output --  Net 1024 ml    Labs/Imaging Results for orders placed or performed during the hospital encounter of 03/18/22 (from the past 48 hour(s))  CBG monitoring, ED     Status: Abnormal   Collection Time: 03/18/22  6:27 PM  Result Value Ref Range   Glucose-Capillary 376 (H) 70 - 99 mg/dL    Comment: Glucose reference range applies only to samples taken after fasting for at least 8 hours.  Comprehensive metabolic panel     Status: Abnormal   Collection Time: 03/18/22  7:10  PM  Result Value Ref Range   Sodium 122 (L) 135 - 145 mmol/L   Potassium 5.8 (H) 3.5 - 5.1 mmol/L    Comment: DELTA CHECK NOTED   Chloride 87 (L) 98 - 111 mmol/L   CO2 <7 (L) 22 - 32 mmol/L   Glucose, Bld 388 (H) 70 - 99 mg/dL    Comment: Glucose reference range applies only to samples taken after fasting for at least 8 hours.   BUN 61 (H) 8 - 23 mg/dL   Creatinine, Ser 3.85 (H) 0.44 - 1.00 mg/dL    Comment: DELTA CHECK NOTED   Calcium 8.3 (L) 8.9 - 10.3 mg/dL   Total Protein 5.7 (L) 6.5 - 8.1 g/dL   Albumin 3.1 (L) 3.5 - 5.0 g/dL   AST 44 (H) 15 - 41 U/L   ALT 129 (H) 0 - 44 U/L   Alkaline Phosphatase 398 (H) 38 - 126 U/L   Total Bilirubin 2.0 (H) 0.3 - 1.2 mg/dL   GFR, Estimated 12 (L) >60 mL/min    Comment: (NOTE) Calculated using the CKD-EPI Creatinine Equation (2021)    Anion gap NOT CALCULATED 5 - 15    Comment: Performed at [facility], [place] [address], [place], [place] [address]  Brain natriuretic peptide     Status: Abnormal   Collection Time: 03/18/22  7:10 PM  Result Value Ref Range   B Natriuretic Peptide 1,266.8 (H) 0.0 - 100.0 pg/mL    Comment: Performed at [facility], [place] [address] [place], [place] [address]  Lipase, blood     Status: None   Collection Time: 03/18/22  7:10 PM  Result Value Ref Range   Lipase 28  11 - 51 U/L    Comment: Performed at [facility], [place] [address], [place], [place] [address]  CBC with Differential     Status: Abnormal   Collection Time: 03/18/22  7:10 PM  Result Value Ref Range   WBC 6.6 4.0 - 10.5 K/uL   RBC 3.60 (L) 3.87 - 5.11 MIL/uL   Hemoglobin 10.1 (L) 12.0 - 15.0 g/dL   HCT 32.9 (L) 36.0 - 46.0 %   MCV 91.4 80.0 - 100.0 fL   MCH 28.1 26.0 - 34.0 pg   MCHC 30.7 30.0 - 36.0 g/dL   RDW 14.4 11.5 - 15.5 %   Platelets 244 150 - 400 K/uL   nRBC 0.0 0.0 - 0.2 %   Neutrophils Relative % 80 %   Neutro Abs 5.3 1.7 - 7.7 K/uL   Lymphocytes Relative 11 %   Lymphs Abs 0.7 0.7 - 4.0 K/uL   Monocytes Relative 5 %   Monocytes Absolute 0.4 0.1 - 1.0 K/uL   Eosinophils Relative 2 %   Eosinophils Absolute 0.1 0.0 - 0.5 K/uL   Basophils Relative 1 %   Basophils Absolute 0.0 0.0 - 0.1 K/uL   Immature Granulocytes 1 %   Abs Immature Granulocytes 0.06 0.00 - 0.07 K/uL    Comment: Performed at [facility], [place] [address] [place], [place] [address]  Hepatitis panel, acute     Status: None   Collection Time: 03/18/22  7:10 PM  Result Value Ref Range   Hepatitis B Surface Ag NON REACTIVE NON REACTIVE   HCV Ab NON REACTIVE NON REACTIVE    Comment: (NOTE) Nonreactive HCV antibody screen is consistent with no HCV infections,  unless recent infection is suspected or other evidence exists to indicate HCV  infection.     Hep A IgM NON REACTIVE NON REACTIVE   Hep B C IgM NON REACTIVE NON REACTIVE    Comment: Performed at [facility], [place] [address] [place], [place] [address]  TSH     Status: None   Collection Time: 03/18/22  7:10 PM  Result Value Ref Range   TSH 1.865 0.350 - 4.500 uIU/mL    Comment: Performed by a 3rd Generation assay with a functional sensitivity of <=0.01 uIU/mL. Performed at [facility], [place] [address], [place], [place] [address]   T4, free     Status: None   Collection Time: 03/18/22   7:10 PM  Result Value Ref Range   Free T4 0.83 0.61 - 1.12 ng/dL    Comment: (NOTE) Biotin ingestion may interfere with free T4 tests. If the results are inconsistent with the TSH level, previous test results, or the clinical presentation, then consider biotin interference. If needed, order repeat testing after stopping biotin. Performed at [facility], [place] [address], [place], [place] [address]   Blood gas, venous     Status: Abnormal   Collection Time: 03/18/22  9:27 PM  Result Value Ref Range   pH, Ven 7.06 (LL) 7.25 - 7.43    Comment: CRITICAL RESULT CALLED TO, READ BACK BY AND VERIFIED WITH: LIVINGSTON,K. 03/18/22 '@2204'$  BY SEEL,M.    pCO2, Ven <18 (LL) 44 - 60 mmHg    Comment: CRITICAL RESULT CALLED TO, READ BACK BY AND VERIFIED WITH: LIVINGSTON,K. 03/18/22 '@2204'$  BY SEEL,M.    pO2, Ven 81 (H) 32 - 45 mmHg   Bicarbonate 4.5 (L) 20.0 - 28.0 mmol/L   Acid-base deficit 23.8 (H) 0.0 - 2.0 mmol/L   O2 Saturation 95.2 %   Patient temperature 37.1     Comment: Performed at [facility], [place] [address] [place], [place] [address]  CBG monitoring, ED     Status: Abnormal   Collection Time: 03/18/22  9:28 PM  Result Value Ref Range   Glucose-Capillary 394 (H) 70 - 99 mg/dL    Comment: Glucose reference range applies only to samples taken after fasting for at least 8 hours.  Beta-hydroxybutyric acid     Status: Abnormal   Collection Time: 03/18/22  9:30 PM  Result Value Ref Range   Beta-Hydroxybutyric Acid >8.00 (H) 0.05 - 0.27 mmol/L    Comment: RESULT CONFIRMED BY MANUAL DILUTION Performed at [place] [address], [place], [place] [id number]   Basic metabolic panel     Status: Abnormal   Collection Time: 03/18/22  9:30 PM  Result Value Ref Range   Sodium 123 (L) 135 - 145 mmol/L   Potassium 5.3 (H) 3.5 - 5.1 mmol/L   Chloride 93 (L) 98 - 111 mmol/L   CO2 <7 (L) 22 - 32 mmol/L   Glucose, Bld 370 (H) 70 - 99 mg/dL     Comment: Glucose reference range applies only to samples taken after fasting for at least 8 hours.   BUN 63 (H) 8 - 23 mg/dL   Creatinine, Ser 3.00 (H) 0.44 - 1.00 mg/dL   Calcium 7.4 (L) 8.9 - 10.3 mg/dL   GFR, Estimated 17 (L) >60 mL/min    Comment: (NOTE) Calculated using the CKD-EPI Creatinine Equation (2021)    Anion gap NOT CALCULATED 5 - 15    Comment: Performed at [facility], [place] [address], [place],  [address]  CBG monitoring, ED  Status: Abnormal   Collection Time: 03/18/22 11:01 PM  Result Value Ref Range   Glucose-Capillary 381 (H) 70 - 99 mg/dL    Comment: Glucose reference range applies only to samples taken after fasting for at least 8 hours.  CBG monitoring, ED     Status: Abnormal   Collection Time: 03/18/22 11:59 PM  Result Value Ref Range   Glucose-Capillary 358 (H) 70 - 99 mg/dL    Comment: Glucose reference range applies only to samples taken after fasting for at least 8 hours.  HIV Antibody (routine testing w rflx)     Status: None   Collection Time: 03/19/22  1:08 AM  Result Value Ref Range   HIV Screen 4th Generation wRfx Non Reactive Non Reactive    Comment: Performed at [facility], [place] [address] [place], [place] [address]  Acetaminophen level     Status: Abnormal   Collection Time: 03/19/22  1:08 AM  Result Value Ref Range   Acetaminophen (Tylenol), Serum <10 (L) 10 - 30 ug/mL    Comment: (NOTE) Therapeutic concentrations vary significantly. A range of 10-30 ug/mL  may be an effective concentration for many patients. However, some  are best treated at concentrations outside of this range. Acetaminophen concentrations >150 ug/mL at 4 hours after ingestion  and >50 ug/mL at 12 hours after ingestion are often associated with  toxic reactions.  Performed at [facility], [place] [address] [place], [place] [address]   Protime-INR     Status: None   Collection Time: 03/19/22  1:08 AM  Result Value  Ref Range   Prothrombin Time 14.3 11.4 - 15.2 seconds   INR 1.1 0.8 - 1.2    Comment: (NOTE) INR goal varies based on device and disease states. Performed at [facility], [place] [address] [place], [place] [id number]   Basic metabolic panel     Status: Abnormal   Collection Time: 03/19/22  1:09 AM  Result Value Ref Range   Sodium 122 (L) 135 - 145 mmol/L   Potassium 5.3 (H) 3.5 - 5.1 mmol/L   Chloride 88 (L) 98 - 111 mmol/L   CO2 <7 (L) 22 - 32 mmol/L   Glucose, Bld 401 (H) 70 - 99 mg/dL    Comment: Glucose reference range applies only to samples taken after fasting for at least 8 hours.   BUN 72 (H) 8 - 23 mg/dL   Creatinine, Ser 4.02 (H) 0.44 - 1.00 mg/dL   Calcium 8.1 (L) 8.9 - 10.3 mg/dL   GFR, Estimated 12 (L) >60 mL/min    Comment: (NOTE) Calculated using the CKD-EPI Creatinine Equation (2021)    Anion gap NOT CALCULATED 5 - 15    Comment: Performed at [facility], [place] [address], [place], [place] [address]  CBG monitoring, ED     Status: Abnormal   Collection Time: 03/19/22  1:09 AM  Result Value Ref Range   Glucose-Capillary 386 (H) 70 - 99 mg/dL    Comment: Glucose reference range applies only to samples taken after fasting for at least 8 hours.  CBG monitoring, ED     Status: Abnormal   Collection Time: 03/19/22  2:19 AM  Result Value Ref Range   Glucose-Capillary 314 (H) 70 - 99 mg/dL    Comment: Glucose reference range applies only to samples taken after fasting for at least 8 hours.  CBG monitoring, ED     Status: Abnormal   Collection Time: 03/19/22  3:19 AM  Result Value Ref Range   Glucose-Capillary 292 (H) 70 - 99 mg/dL    Comment: Glucose reference range applies only to samples taken after fasting for at least 8 hours.  CBG monitoring, ED     Status: Abnormal   Collection Time: 03/19/22  4:25 AM  Result Value Ref Range   Glucose-Capillary 261 (H) 70 - 99 mg/dL    Comment: Glucose reference range applies only to  samples taken after fasting for at least 8 hours.  CBG monitoring, ED     Status: Abnormal   Collection Time: 03/19/22  5:25 AM  Result Value Ref Range   Glucose-Capillary 220 (H) 70 - 99 mg/dL    Comment: Glucose reference range applies only to samples taken after fasting for at least 8 hours.  Basic metabolic panel     Status: Abnormal   Collection Time: 03/19/22  5:38 AM  Result Value Ref Range   Sodium 121 (L) 135 - 145 mmol/L   Potassium 4.6 3.5 - 5.1 mmol/L   Chloride 88 (L) 98 - 111 mmol/L   CO2 12 (L) 22 - 32 mmol/L   Glucose, Bld 220 (H) 70 - 99 mg/dL    Comment: Glucose reference range applies only to samples taken after fasting for at least 8 hours.   BUN 70 (H) 8 - 23 mg/dL   Creatinine, Ser 3.84 (H) 0.44 - 1.00 mg/dL   Calcium 8.0 (L) 8.9 - 10.3 mg/dL   GFR, Estimated 12 (L) >60 mL/min    Comment: (NOTE) Calculated using the CKD-EPI Creatinine Equation (2021)    Anion gap >20 (H) 5 - 15    Comment: Performed at [facility], [place] [address], [place], [place] [address]  Beta-hydroxybutyric acid     Status: Abnormal   Collection Time: 03/19/22  5:38 AM  Result Value Ref Range   Beta-Hydroxybutyric Acid 6.36 (H) 0.05 - 0.27 mmol/L    Comment: RESULT CONFIRMED BY MANUAL DILUTION Performed at [place] [address] [place], [place] [address]   CBG monitoring, ED     Status: Abnormal   Collection Time: 03/19/22  6:07 AM  Result Value Ref Range   Glucose-Capillary 223 (H) 70 - 99 mg/dL    Comment: Glucose reference range applies only to samples taken after fasting for at least 8 hours.  CBG monitoring, ED     Status: Abnormal   Collection Time: 03/19/22  7:28 AM  Result Value Ref Range   Glucose-Capillary 228 (H) 70 - 99 mg/dL    Comment: Glucose reference range applies only to samples taken after fasting for at least 8 hours.  CBG monitoring, ED     Status: Abnormal   Collection Time: 03/19/22  8:30 AM  Result Value Ref  Range   Glucose-Capillary 215 (H) 70 - 99 mg/dL    Comment: Glucose reference range applies only to samples taken after fasting for at least 8 hours.  Basic metabolic panel     Status: Abnormal   Collection Time: 03/19/22  8:45 AM  Result Value Ref Range   Sodium 122 (L) 135 - 145 mmol/L   Potassium 4.6 3.5 - 5.1 mmol/L   Chloride 90 (L) 98 - 111 mmol/L   CO2 13 (L) 22 - 32 mmol/L   Glucose, Bld 209 (H) 70 - 99 mg/dL    Comment: Glucose reference range applies only to samples taken after fasting for at least 8 hours.   BUN 67 (  H) 8 - 23 mg/dL   Creatinine, Ser 3.73 (H) 0.44 - 1.00 mg/dL   Calcium 8.0 (L) 8.9 - 10.3 mg/dL   GFR, Estimated 13 (L) >60 mL/min    Comment: (NOTE) Calculated using the CKD-EPI Creatinine Equation (2021)    Anion gap 19 (H) 5 - 15    Comment: Performed at [facility], [place] [address] [place], [place] [address]  Hepatic function panel     Status: Abnormal   Collection Time: 03/19/22  8:45 AM  Result Value Ref Range   Total Protein 5.5 (L) 6.5 - 8.1 g/dL   Albumin 2.8 (L) 3.5 - 5.0 g/dL   AST 39 15 - 41 U/L   ALT 113 (H) 0 - 44 U/L   Alkaline Phosphatase 348 (H) 38 - 126 U/L   Total Bilirubin 1.3 (H) 0.3 - 1.2 mg/dL   Bilirubin, Direct 0.1 0.0 - 0.2 mg/dL   Indirect Bilirubin 1.2 (H) 0.3 - 0.9 mg/dL    Comment: Performed at [facility], [place] [address], [place], [place] [address]  CBG monitoring, ED     Status: Abnormal   Collection Time: 03/19/22  9:31 AM  Result Value Ref Range   Glucose-Capillary 210 (H) 70 - 99 mg/dL    Comment: Glucose reference range applies only to samples taken after fasting for at least 8 hours.  CBG monitoring, ED     Status: Abnormal   Collection Time: 03/19/22 11:09 AM  Result Value Ref Range   Glucose-Capillary 180 (H) 70 - 99 mg/dL    Comment: Glucose reference range applies only to samples taken after fasting for at least 8 hours.   US RENAL  Result Date: 03/18/2022 CLINICAL  DATA:  Urinary retention. EXAM: RENAL / URINARY TRACT ULTRASOUND COMPLETE COMPARISON:  Abdominal MRI 09/26/2020, CT 09/18/2020 FINDINGS: Right Kidney: Renal measurements: 9.5 x 4.3 x 5 cm = volume: 108 mL. No hydronephrosis. Diffusely increased renal parenchymal echogenicity in thinning of the renal parenchyma. Simple cyst in the mid right kidney measures 1.2 cm. Complex cyst in the medial kidney measures 1.4 x 1.2 x 1.2 cm. Trace perinephric fluid. No evidence of renal calculi. Left Kidney: Renal measurements: 10.2 x 4.5 x 4.8 cm = volume: 115 mL. No hydronephrosis. Increased renal parenchymal echogenicity in thinning of the renal parenchyma. Simple cyst on prior MRI are not well demonstrated. No evidence of solid lesion. No visible renal stone. Trace perinephric fluid. Bladder: Mild diffuse bladder wall thickening at 5 mm. Moderate bladder distension. Both ureteral jets are demonstrated. Other: None. IMPRESSION: 1. No obstructive uropathy. 2. Increased bilateral renal parenchymal echogenicity typical of chronic medical renal disease. Trace bilateral perinephric fluid is likely chronic. 3. Small simple cysts in the right kidney. Septated/complex right renal cyst on prior MRI is difficult to delineate by ultrasound. MRI follow-up is recommended on prior abdominal MRI, recommend this be performed on an elective basis in the near future not performed elsewhere. 4. Mild diffuse bladder wall thickening. Moderate bladder distension. Electronically Signed   By: Keith Rake M.D.   On: 03/18/2022 22:59   US Abdomen Limited RUQ (LIVER/GB)  Result Date: 03/18/2022 CLINICAL DATA:  Right upper quadrant abdominal pain for 2 days EXAM: ULTRASOUND ABDOMEN LIMITED RIGHT UPPER QUADRANT COMPARISON:  MRI 09/26/2020.  CT scan 09/18/2020 FINDINGS: Gallbladder: No gallstones or wall thickening visualized. No sonographic Murphy sign noted by sonographer. Common bile duct: Diameter: 7 mm, upper limits of normal for patient's age  Liver: No focal  lesion identified. Within normal limits in parenchymal echogenicity. Portal vein is patent on color Doppler imaging with normal direction of blood flow towards the liver. Other: None. IMPRESSION: No gallstones or ductal dilatation. Electronically Signed   By: Jill Side M.D.   On: 03/18/2022 19:23   DG Chest Port 1 View  Result Date: 03/18/2022 CLINICAL DATA:  Shortness of breath EXAM: PORTABLE CHEST - 1 VIEW COMPARISON:  06/19/2020 FINDINGS: Cardiomediastinal silhouette and pulmonary vasculature are within normal limits. Median sternotomy changes again seen. Right humeral fixation hardware partially visualized. Mild linear opacity at the left lateral lung base likely due to atelectasis. Lungs otherwise clear. IMPRESSION: No acute cardiopulmonary process. Electronically Signed   By: Miachel Roux M.D.   On: 03/18/2022 18:52    Pending Labs Unresulted Labs (From admission, onward)     Start     Ordered   03/19/22 0500  Lamotrigine level  Once,   R        03/19/22 0500   03/18/22 [id number]  Basic metabolic panel  (Diabetes Ketoacidosis (DKA))  STAT Now then every 4 hours ,   STAT      03/18/22 2115   03/18/22 2115  Beta-hydroxybutyric acid  (Diabetes Ketoacidosis (DKA))  Now then every 8 hours,   STAT (with TIMED occurrences)      03/18/22 2115   03/18/22 2114  Blood gas, venous (at [facility] and [facility])  Once,   R        03/18/22 2115   03/18/22 1902  T3, free  Once,   URGENT        03/18/22 1901   03/18/22 1815  Urinalysis, Routine w reflex microscopic -Urine, Clean Catch  Once,   URGENT       Question:  Specimen Source  Answer:  Urine, Clean Catch   03/18/22 1817            Vitals/Pain Today's Vitals   03/19/22 0600 03/19/22 0725 03/19/22 0830 03/19/22 1000  BP: 132/60 (!) 145/64 (!) 148/63 (!) 148/61  Pulse: 66 69 67 69  Resp: '18 20  19  '$ Temp:  97.6 F (36.4 C)    TempSrc:  Axillary    SpO2: 98% 98%  100%  PainSc:        Isolation Precautions No active  isolations  Medications Medications  insulin regular, human (MYXREDLIN) 100 units/ 100 mL infusion (2.8 Units/hr Intravenous Rate/Dose Change 03/19/22 1113)  lactated ringers infusion (0 mLs Intravenous Paused 03/19/22 0614)  dextrose 5 % in lactated ringers infusion ( Intravenous New Bag/Given 03/19/22 0529)  dextrose 50 % solution 0-50 mL (has no administration in time range)  aspirin EC tablet 325 mg (325 mg Oral Given 03/19/22 1000)  carvedilol (COREG) tablet 12.5 mg (12.5 mg Oral Given 03/19/22 0830)  mirtazapine (REMERON) tablet 30 mg (30 mg Oral Given 03/19/22 0007)  thyroid (ARMOUR) tablet 60 mg (60 mg Oral Given 03/19/22 0829)  lipase/protease/amylase (CREON) capsule 12,000 Units (12,000 Units Oral Given 03/19/22 1000)  pantoprazole (PROTONIX) EC tablet 40 mg (40 mg Oral Given 03/19/22 1000)  lamoTRIgine (LAMICTAL) tablet 150 mg (150 mg Oral Given 03/19/22 1000)  loratadine (CLARITIN) tablet 10 mg (10 mg Oral Given 03/19/22 1000)  dextromethorphan-guaiFENesin (MUCINEX DM) 30-600 MG per 12 hr tablet 1 tablet (1 tablet Oral Given 03/19/22 1000)  lactated ringers bolus 1,024 mL (0 mLs Intravenous Stopped 03/19/22 0158)    Mobility walks with person assist     Focused Assessments Cardiac Assessment Handoff:  No results found for: "CKTOTAL", "CKMB", "CKMBINDEX", "TROPONINI" No results found for: "DDIMER" Does the Patient currently have chest pain? No    R Recommendations: See Admitting Provider Note  Report given to:   Additional Notes:  Patient has baedside commode due to weakness

## 2022-03-19 NOTE — Progress Notes (Signed)
  Echocardiogram 2D Echocardiogram has been performed.  Lana Fish 03/19/2022, 2:40 PM

## 2022-03-20 DIAGNOSIS — E1010 Type 1 diabetes mellitus with ketoacidosis without coma: Secondary | ICD-10-CM | POA: Diagnosis not present

## 2022-03-20 LAB — GLUCOSE, CAPILLARY
Glucose-Capillary: 146 mg/dL — ABNORMAL HIGH (ref 70–99)
Glucose-Capillary: 160 mg/dL — ABNORMAL HIGH (ref 70–99)
Glucose-Capillary: 153 mg/dL — ABNORMAL HIGH (ref 70–99)
Glucose-Capillary: 134 mg/dL — ABNORMAL HIGH (ref 70–99)
Glucose-Capillary: 164 mg/dL — ABNORMAL HIGH (ref 70–99)
Glucose-Capillary: 141 mg/dL — ABNORMAL HIGH (ref 70–99)
Glucose-Capillary: 146 mg/dL — ABNORMAL HIGH (ref 70–99)
Glucose-Capillary: 183 mg/dL — ABNORMAL HIGH (ref 70–99)
Glucose-Capillary: 168 mg/dL — ABNORMAL HIGH (ref 70–99)
Glucose-Capillary: 126 mg/dL — ABNORMAL HIGH (ref 70–99)
Glucose-Capillary: 104 mg/dL — ABNORMAL HIGH (ref 70–99)

## 2022-03-20 LAB — BASIC METABOLIC PANEL
Anion gap: 9 (ref 5–15)
Anion gap: 12 (ref 5–15)
Anion gap: 12 (ref 5–15)
BUN: 71 mg/dL — ABNORMAL HIGH (ref 8–23)
BUN: 62 mg/dL — ABNORMAL HIGH (ref 8–23)
BUN: 61 mg/dL — ABNORMAL HIGH (ref 8–23)
CO2: 19 mmol/L — ABNORMAL LOW (ref 22–32)
CO2: 18 mmol/L — ABNORMAL LOW (ref 22–32)
CO2: 19 mmol/L — ABNORMAL LOW (ref 22–32)
Calcium: 7.9 mg/dL — ABNORMAL LOW (ref 8.9–10.3)
Calcium: 8.3 mg/dL — ABNORMAL LOW (ref 8.9–10.3)
Calcium: 8.4 mg/dL — ABNORMAL LOW (ref 8.9–10.3)
Chloride: 95 mmol/L — ABNORMAL LOW (ref 98–111)
Chloride: 95 mmol/L — ABNORMAL LOW (ref 98–111)
Chloride: 94 mmol/L — ABNORMAL LOW (ref 98–111)
Creatinine, Ser: 3.22 mg/dL — ABNORMAL HIGH (ref 0.44–1.00)
Creatinine, Ser: 2.96 mg/dL — ABNORMAL HIGH (ref 0.44–1.00)
Creatinine, Ser: 2.89 mg/dL — ABNORMAL HIGH (ref 0.44–1.00)
GFR, Estimated: 15 mL/min — ABNORMAL LOW (ref >60)
GFR, Estimated: 17 mL/min — ABNORMAL LOW (ref >60)
GFR, Estimated: 17 mL/min — ABNORMAL LOW (ref >60)
Glucose, Bld: 153 mg/dL — ABNORMAL HIGH (ref 70–99)
Glucose, Bld: 158 mg/dL — ABNORMAL HIGH (ref 70–99)
Glucose, Bld: 157 mg/dL — ABNORMAL HIGH (ref 70–99)
Potassium: 3.9 mmol/L (ref 3.5–5.1)
Potassium: 3.7 mmol/L (ref 3.5–5.1)
Potassium: 3.7 mmol/L (ref 3.5–5.1)
Sodium: 123 mmol/L — ABNORMAL LOW (ref 135–145)
Sodium: 125 mmol/L — ABNORMAL LOW (ref 135–145)
Sodium: 125 mmol/L — ABNORMAL LOW (ref 135–145)

## 2022-03-20 LAB — HEPATIC FUNCTION PANEL
ALT: 84 U/L — ABNORMAL HIGH (ref 0–44)
AST: 32 U/L (ref 15–41)
Albumin: 2.4 g/dL — ABNORMAL LOW (ref 3.5–5.0)
Alkaline Phosphatase: 288 U/L — ABNORMAL HIGH (ref 38–126)
Bilirubin, Direct: <0.1 mg/dL (ref 0.0–0.2)
Total Bilirubin: 0.6 mg/dL (ref 0.3–1.2)
Total Protein: 4.7 g/dL — ABNORMAL LOW (ref 6.5–8.1)

## 2022-03-20 LAB — CBC
HCT: 24.9 % — ABNORMAL LOW (ref 36.0–46.0)
Hemoglobin: 8.6 g/dL — ABNORMAL LOW (ref 12.0–15.0)
MCH: 27.8 pg (ref 26.0–34.0)
MCHC: 34.5 g/dL (ref 30.0–36.0)
MCV: 80.6 fL (ref 80.0–100.0)
Platelets: 308 10*3/uL (ref 150–400)
RBC: 3.09 MIL/uL — ABNORMAL LOW (ref 3.87–5.11)
RDW: 14.2 % (ref 11.5–15.5)
WBC: 11.6 10*3/uL — ABNORMAL HIGH (ref 4.0–10.5)
nRBC: 0 % (ref 0.0–0.2)

## 2022-03-20 LAB — BETA-HYDROXYBUTYRIC ACID: Beta-Hydroxybutyric Acid: 0.25 mmol/L (ref 0.05–0.27)

## 2022-03-20 LAB — BRAIN NATRIURETIC PEPTIDE: B Natriuretic Peptide: 892.7 pg/mL — ABNORMAL HIGH (ref 0.0–100.0)

## 2022-03-20 MED ORDER — INSULIN ASPART 100 UNIT/ML IJ SOLN: 0.0000 [IU] | Freq: Every day | INTRAMUSCULAR | Status: DC

## 2022-03-20 MED ORDER — INSULIN DETEMIR 100 UNIT/ML ~~LOC~~ SOLN
6.0000 [IU] | Freq: Every day | SUBCUTANEOUS | Status: DC
  Administered 2022-03-20 – 2022-03-21 (×2): 6 [IU] via SUBCUTANEOUS
  Filled 2022-03-20 (×2): qty 0.06

## 2022-03-20 MED ORDER — SODIUM CHLORIDE 0.9 % IV SOLN: INTRAVENOUS | Status: DC

## 2022-03-20 MED ORDER — INSULIN ASPART 100 UNIT/ML IJ SOLN
0.0000 [IU] | Freq: Three times a day (TID) | INTRAMUSCULAR | Status: DC
  Administered 2022-03-20: 2 [IU] via SUBCUTANEOUS
  Administered 2022-03-20: 1 [IU] via SUBCUTANEOUS
  Administered 2022-03-21: 2 [IU] via SUBCUTANEOUS
  Administered 2022-03-21: 5 [IU] via SUBCUTANEOUS

## 2022-03-20 MED ORDER — MENTHOL 3 MG MT LOZG
1.0000 | LOZENGE | OROMUCOSAL | Status: DC | PRN
  Administered 2022-03-20: 3 mg via ORAL
  Filled 2022-03-20: qty 9

## 2022-03-20 NOTE — Progress Notes (Signed)
  Transition of Care [facility]) Screening Note   Patient Details  Name: Brittney Pace Date of Birth: [birth year]   Transition of Care [facility]) CM/SW Contact:    Henrietta Dine, RN Phone Number: 03/20/2022, 9:20 AM    Transition of Care Department [facility]) has reviewed patient and no TOC needs have been identified at this time. We will continue to monitor patient advancement through interdisciplinary progression rounds. If new patient transition needs arise, please place a TOC consult.

## 2022-03-20 NOTE — Progress Notes (Addendum)
PROGRESS NOTE    Brittney Pace  [id number] DOB: 05-Jan-1956 DOA: 03/18/2022 PCP: [name], MD     Brief Narrative:  Brittney Pace is a 67 y.o. 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    Patient was seen by [name] in office visit on 2/22.  At that time, she was appearing unwell, blood work revealed hyponatremia, AKI, elevated liver enzymes.  She was referred to be evaluated in the ED.  She was admitted to [facility] and awaiting transfer.  However, patient did not want to wait and signed out [place] prior to transferring to [facility].  She returned to [place] long ED on 2/23.  Continues to feel fatigued, weak.  She was found to be in DKA with hyponatremia, hyperkalemia, metabolic acidosis with elevated anion gap, elevated LFTs.  Patient was started on IV fluid and IV insulin.  New events last 24 hours / Subjective: Sitting in chair, wants to eat. Feeling much better today.   Assessment & Plan:   Principal Problem:   DKA, type 1 ([place]) Active Problems:   CAD (coronary artery disease)   Anemia due to stage 3 chronic kidney disease (HCC)   Depression   Acquired hypothyroidism   Transaminitis   Hyponatremia   Hyperkalemia   Acute kidney injury superimposed on chronic kidney disease (HCC)   HTN (hypertension)   DKA -Anion gap closed. Transitioned to subq insulin today   AKI on CKD stage 4 -Baseline creatinine 2.3 -Followed by nephrology at [facility].  Most recent progress note reviewed today -Renal ultrasound consistent with chronic medical renal disease, small simple cyst right kidney, can follow-up with outpatient MRI -Continue IV fluid -Improving   Elevated liver enzymes -Previously referred to GI due to positive hepatitis  B antigen.  Most recent progress note reviewed today.  Repeat hepatitis panel has been negative. -Abdominal ultrasound unremarkable for cirrhosis or fatty liver -Elevated liver enzymes may be secondary to dehydration versus medication use versus congestive hepatopathy in setting of elevated BNP -LFTs are improving  Hyponatremia -Insetting of DKA -Improving   Elevated BNP -Echo without evidence of CHF   Hypothyroidism -[place] Thyroid  Depression -Lamictal  Anemia of chronic disease -Followed by hematology, [name]  CAD status post CABG -Stable.  Aspirin, Coreg   DVT prophylaxis:  SCDs Start: 03/18/22 2205  Code Status: DNR. She has a MOST form completed on file in 2018. Confirmed with patient today that she remains DNR.  Family Communication: No family at bedside Disposition Plan:  Status is: Inpatient Remains inpatient appropriate because: IV fluid   Antimicrobials:  Anti-infectives (From admission, onward)    None        Objective: Vitals:   03/20/22 0600 03/20/22 0700 03/20/22 0800 03/20/22 0814  BP: (!) 143/67 (!) 132/34  (!) 158/72  Pulse: 62 63  64  Resp: 16 18  (!) 22  Temp:   98.4 F (36.9 C)   TempSrc:   Oral   SpO2: 95% 95%  93%  Weight:      Height:        Intake/Output Summary (Last 24 hours) at 03/20/2022 0952 Last data filed at 03/20/2022 0800 Gross per 24 hour  Intake 4003.18 ml  Output 1000 ml  Net 3003.18 ml    Filed Weights   03/19/22 1215  Weight: 53.3 kg    Examination:  General exam: Appears calm and comfortable  Respiratory system: Clear to auscultation. Respiratory effort normal. No respiratory distress. No conversational dyspnea.  Cardiovascular system: S1 & S2 heard, RRR. No murmurs. No pedal edema. Gastrointestinal system: Abdomen is nondistended, soft and nontender. Normal bowel sounds heard. Central nervous system: Alert and oriented. No focal neurological deficits. Speech clear.  Extremities: Symmetric in  appearance  Skin: No rashes, lesions or ulcers on exposed skin  Psychiatry: Judgement and insight appear normal. Mood & affect appropriate.   Data Reviewed: I have personally reviewed following labs and imaging studies  CBC: Recent Labs  Lab 03/17/22 1445 03/17/22 1928 03/18/22 1910 03/20/22 0618  WBC 5.9 7.4 6.6 11.6*  NEUTROABS 3.8  --  5.3  --   HGB 9.5* 9.5* 10.1* 8.6*  HCT 28.9* 28.0* 32.9* 24.9*  MCV 83.8 82.1 91.4 80.6  PLT 216 223 244 [id number]    Basic Metabolic Panel: Recent Labs  Lab 03/19/22 1319 03/19/22 1701 03/19/22 2055 03/20/22 0102 03/20/22 0618  NA 122* 121* 122* 123* 125*  K 4.6 4.4 4.2 3.9 3.7  CL 91* 94* 92* 95* 95*  CO2 15* 15* 17* 19* 18*  GLUCOSE 179* 176* 139* 153* 158*  BUN 67* 68* 65* 71* 62*  CREATININE 3.52* 3.34* 3.25* 3.22* 2.96*  CALCIUM 8.0* 7.8* 8.0* 7.9* 8.3*    GFR: Estimated Creatinine Clearance: 13.4 mL/min (A) (by C-G formula based on SCr of 2.96 mg/dL (H)). Liver Function Tests: Recent Labs  Lab 03/17/22 1445 03/17/22 1928 03/18/22 1910 03/19/22 0845 03/20/22 0618  AST 85* 79* 44* 39 32  ALT 179* 171* 129* 113* 84*  ALKPHOS 518* 497* 398* 348* 288*  BILITOT 0.6 1.1 2.0* 1.3* 0.6  PROT 6.3* 6.2* 5.7* 5.5* 4.7*  ALBUMIN 3.8 3.2* 3.1* 2.8* 2.4*    Recent Labs  Lab 03/18/22 1910  LIPASE 28    No results for input(s): "AMMONIA" in the last 168 hours. Coagulation Profile: Recent Labs  Lab 03/19/22 0108  INR 1.1    Cardiac Enzymes: No results for input(s): "CKTOTAL", "CKMB", "CKMBINDEX", "TROPONINI" in the last 168 hours. BNP (last 3 results) No results for input(s): "PROBNP" in the last 8760 hours. HbA1C: No results for input(s): "HGBA1C" in the last 72 hours. CBG: Recent Labs  Lab 03/20/22 0302 03/20/22 0505 03/20/22 0601 03/20/22 0656 03/20/22 0750  GLUCAP 153* 134* 164* 141* 146*    Lipid Profile: No results for input(s): "CHOL", "HDL", "LDLCALC", "TRIG", "CHOLHDL", "LDLDIRECT" in the last 72  hours. Thyroid Function Tests: Recent Labs    03/18/22 1910  TSH 1.865  FREET4 0.83    Anemia Panel: Recent Labs    03/17/22 1445  FERRITIN 118  TIBC 274  IRON 43  RETICCTPCT 0.6    Sepsis Labs: No results for input(s): "PROCALCITON", "LATICACIDVEN" in the last 168 hours.  Recent Results (from the past 240 hour(s))  Resp panel by RT-PCR (RSV, Flu A&B, Covid) Nasopharyngeal Swab     Status: None   Collection Time: 03/17/22  9:09 PM   Specimen: Nasopharyngeal Swab; Nasal Swab  Result Value Ref Range Status   SARS Coronavirus 2 by RT PCR NEGATIVE NEGATIVE Final    Comment: (NOTE) SARS-CoV-2 target nucleic acids are NOT DETECTED.  The SARS-CoV-2 RNA is generally detectable in upper respiratory specimens during the acute phase of infection. The lowest concentration of SARS-CoV-2 viral copies this assay can detect is 138 copies/mL. A negative result does not preclude SARS-Cov-2  infection and should not be used as the sole basis for treatment or other patient management decisions. A negative result may occur with  improper specimen collection/handling, submission of specimen other than nasopharyngeal swab, presence of viral mutation(s) within the areas targeted by this assay, and inadequate number of viral copies(<138 copies/mL). A negative result must be combined with clinical observations, patient history, and epidemiological information. The expected result is Negative.  Fact Sheet for Patients:  EntrepreneurPulse.com.au  Fact Sheet for Healthcare Providers:  IncredibleEmployment.be  This test is no t yet approved or cleared by the [place] FDA and  has been authorized for detection and/or diagnosis of SARS-CoV-2 by FDA under an Emergency Use Authorization (EUA). This EUA will remain  in effect (meaning this test can be used) for the duration of the COVID-19 declaration under Section 564(b)(1) of the Act, 21 U.S.C.section  360bbb-3(b)(1), unless the authorization is terminated  or revoked sooner.       Influenza A by PCR NEGATIVE NEGATIVE Final   Influenza B by PCR NEGATIVE NEGATIVE Final    Comment: (NOTE) The Xpert Xpress SARS-CoV-2/FLU/RSV plus assay is intended as an aid in the diagnosis of influenza from Nasopharyngeal swab specimens and should not be used as a sole basis for treatment. Nasal washings and aspirates are unacceptable for Xpert Xpress SARS-CoV-2/FLU/RSV testing.  Fact Sheet for Patients: EntrepreneurPulse.com.au  Fact Sheet for Healthcare Providers: IncredibleEmployment.be  This test is not yet approved or cleared by the [place] FDA and has been authorized for detection and/or diagnosis of SARS-CoV-2 by FDA under an Emergency Use Authorization (EUA). This EUA will remain in effect (meaning this test can be used) for the duration of the COVID-19 declaration under Section 564(b)(1) of the Act, 21 U.S.C. section 360bbb-3(b)(1), unless the authorization is terminated or revoked.     Resp Syncytial Virus by PCR NEGATIVE NEGATIVE Final    Comment: (NOTE) Fact Sheet for Patients: EntrepreneurPulse.com.au  Fact Sheet for Healthcare Providers: IncredibleEmployment.be  This test is not yet approved or cleared by the [place] FDA and has been authorized for detection and/or diagnosis of SARS-CoV-2 by FDA under an Emergency Use Authorization (EUA). This EUA will remain in effect (meaning this test can be used) for the duration of the COVID-19 declaration under Section 564(b)(1) of the Act, 21 U.S.C. section 360bbb-3(b)(1), unless the authorization is terminated or revoked.  Performed at [facility], [address] [place], [place] [address]   MRSA Next Gen by PCR, Nasal     Status: None   Collection Time: 03/19/22 12:40 PM   Specimen: Nasal Mucosa; Nasal Swab  Result Value Ref Range  Status   MRSA by PCR Next Gen NOT DETECTED NOT DETECTED Final    Comment: (NOTE) The GeneXpert MRSA Assay (FDA approved for NASAL specimens only), is one component of a comprehensive MRSA colonization surveillance program. It is not intended to diagnose MRSA infection nor to guide or monitor treatment for MRSA infections. Test performance is not FDA approved in patients less than 50 years old. Performed at [facility], [place] [address], [place], [place] [address]       Radiology Studies: ECHOCARDIOGRAM COMPLETE  Result Date: 03/19/2022    ECHOCARDIOGRAM REPORT   Patient Name:   [facility] Date of Exam: 03/19/2022 Medical Rec #:  [id number]        Height:       58.0 in Accession #:    [id number]  Weight:       117.5 lb Date of Birth:  11-18-1955        BSA:          1.453 m Patient Age:    55 years         BP:           145/95 mmHg Patient Gender: F                HR:           67 bpm. Exam Location:  Inpatient Procedure: 2D Echo Indications:    elevated BNP  History:        Patient has prior history of Echocardiogram examinations, most                 recent 01/25/2018. CAD; Risk Factors:Diabetes, Dyslipidemia and                 Hypertension.  Sonographer:    Harvie Junior Referring Phys: [id number] [place]  1. Left ventricular ejection fraction, by estimation, is 55 to 60%. The left ventricle has normal function. The left ventricle has no regional wall motion abnormalities. Left ventricular diastolic parameters were normal.  2. Right ventricular systolic function is mildly reduced. The right ventricular size is mildly enlarged. There is mildly elevated pulmonary artery systolic pressure. The estimated right ventricular systolic pressure is 0000000 mmHg.  3. The mitral valve is degenerative. Trivial mitral valve regurgitation.  4. The aortic valve is tricuspid. There is mild calcification of the aortic valve. Aortic valve regurgitation is not visualized. Aortic  valve sclerosis is present, with no evidence of aortic valve stenosis. Aortic valve mean gradient measures 5.0 mmHg.  5. The inferior vena cava is normal in size with <50% respiratory variability, suggesting right atrial pressure of 8 mmHg. Comparison(s): No prior Echocardiogram. FINDINGS  Left Ventricle: Left ventricular ejection fraction, by estimation, is 55 to 60%. The left ventricle has normal function. The left ventricle has no regional wall motion abnormalities. The left ventricular internal cavity size was normal in size. There is  no left ventricular hypertrophy. Left ventricular diastolic parameters were normal. Right Ventricle: The right ventricular size is mildly enlarged. No increase in right ventricular wall thickness. Right ventricular systolic function is mildly reduced. There is mildly elevated pulmonary artery systolic pressure. The tricuspid regurgitant  velocity is 2.95 m/s, and with an assumed right atrial pressure of 8 mmHg, the estimated right ventricular systolic pressure is 0000000 mmHg. Left Atrium: Left atrial size was normal in size. Right Atrium: Right atrial size was normal in size. Pericardium: There is no evidence of pericardial effusion. Mitral Valve: The mitral valve is degenerative in appearance. There is mild calcification of the mitral valve leaflet(s). Mild mitral annular calcification. Trivial mitral valve regurgitation. Tricuspid Valve: The tricuspid valve is grossly normal. Tricuspid valve regurgitation is mild. Aortic Valve: The aortic valve is tricuspid. There is mild calcification of the aortic valve. There is mild aortic valve annular calcification. Aortic valve regurgitation is not visualized. Aortic valve sclerosis is present, with no evidence of aortic valve stenosis. Aortic valve mean gradient measures 5.0 mmHg. Aortic valve peak gradient measures 9.5 mmHg. Aortic valve area, by VTI measures 2.37 cm. Pulmonic Valve: The pulmonic valve was grossly normal. Pulmonic valve  regurgitation is mild to moderate. Aorta: The aortic root is normal in size and structure. Venous: The inferior vena cava is normal in size with less than 50% respiratory variability, suggesting right atrial pressure  of 8 mmHg. IAS/Shunts: No atrial level shunt detected by color flow Doppler.  LEFT VENTRICLE PLAX 2D LVIDd:         3.90 cm     Diastology LVIDs:         2.50 cm     LV e' medial:    7.51 cm/s LV PW:         0.80 cm     LV E/e' medial:  17.7 LV IVS:        0.80 cm     LV e' lateral:   13.60 cm/s LVOT diam:     2.00 cm     LV E/e' lateral: 9.8 LV SV:         80 LV SV Index:   55 LVOT Area:     3.14 cm                             3D Volume EF: LV Volumes (MOD)           3D EF:        56 % LV vol d, MOD A4C: 91.6 ml LV EDV:       92 ml LV vol s, MOD A4C: 28.9 ml LV ESV:       40 ml LV SV MOD A4C:     91.6 ml LV SV:        52 ml RIGHT VENTRICLE RV Basal diam:  3.00 cm RV Mid diam:    2.70 cm RV S prime:     9.68 cm/s TAPSE (M-mode): 1.2 cm LEFT ATRIUM           Index        RIGHT ATRIUM           Index LA diam:      3.40 cm 2.34 cm/m   RA Area:     10.10 cm LA Vol (A4C): 43.8 ml 30.15 ml/m  RA Volume:   18.30 ml  12.60 ml/m  AORTIC VALVE                     PULMONIC VALVE AV Area (Vmax):    2.64 cm      PV Vmax:          1.21 m/s AV Area (Vmean):   2.40 cm      PV Peak grad:     5.9 mmHg AV Area (VTI):     2.37 cm      PR End Diast Vel: 7.51 msec AV Vmax:           154.50 cm/s AV Vmean:          107.000 cm/s AV VTI:            0.340 m AV Peak Grad:      9.5 mmHg AV Mean Grad:      5.0 mmHg LVOT Vmax:         130.00 cm/s LVOT Vmean:        81.800 cm/s LVOT VTI:          0.256 m LVOT/AV VTI ratio: 0.75  AORTA Ao Root diam: 2.50 cm Ao Asc diam:  2.90 cm MITRAL VALVE                TRICUSPID VALVE MV Area (PHT): 3.85 cm     TR Peak grad:   34.8 mmHg MV Decel Time: 197 msec  TR Vmax:        295.00 cm/s MR Peak grad: 24.2 mmHg MR Vmax:      246.00 cm/s   SHUNTS MV E velocity: 133.00 cm/s  Systemic  VTI:  0.26 m MV A velocity: 99.40 cm/s   Systemic Diam: 2.00 cm MV E/A ratio:  1.34 Rozann Lesches MD Electronically signed by Rozann Lesches MD Signature Date/Time: 03/19/2022/3:03:45 PM    Final    US RENAL  Result Date: 03/18/2022 CLINICAL DATA:  Urinary retention. EXAM: RENAL / URINARY TRACT ULTRASOUND COMPLETE COMPARISON:  Abdominal MRI 09/26/2020, CT 09/18/2020 FINDINGS: Right Kidney: Renal measurements: 9.5 x 4.3 x 5 cm = volume: 108 mL. No hydronephrosis. Diffusely increased renal parenchymal echogenicity in thinning of the renal parenchyma. Simple cyst in the mid right kidney measures 1.2 cm. Complex cyst in the medial kidney measures 1.4 x 1.2 x 1.2 cm. Trace perinephric fluid. No evidence of renal calculi. Left Kidney: Renal measurements: 10.2 x 4.5 x 4.8 cm = volume: 115 mL. No hydronephrosis. Increased renal parenchymal echogenicity in thinning of the renal parenchyma. Simple cyst on prior MRI are not well demonstrated. No evidence of solid lesion. No visible renal stone. Trace perinephric fluid. Bladder: Mild diffuse bladder wall thickening at 5 mm. Moderate bladder distension. Both ureteral jets are demonstrated. Other: None. IMPRESSION: 1. No obstructive uropathy. 2. Increased bilateral renal parenchymal echogenicity typical of chronic medical renal disease. Trace bilateral perinephric fluid is likely chronic. 3. Small simple cysts in the right kidney. Septated/complex right renal cyst on prior MRI is difficult to delineate by ultrasound. MRI follow-up is recommended on prior abdominal MRI, recommend this be performed on an elective basis in the near future not performed elsewhere. 4. Mild diffuse bladder wall thickening. Moderate bladder distension. Electronically Signed   By: Keith Rake M.D.   On: 03/18/2022 22:59   US Abdomen Limited RUQ (LIVER/GB)  Result Date: 03/18/2022 CLINICAL DATA:  Right upper quadrant abdominal pain for 2 days EXAM: ULTRASOUND ABDOMEN LIMITED RIGHT UPPER  QUADRANT COMPARISON:  MRI 09/26/2020.  CT scan 09/18/2020 FINDINGS: Gallbladder: No gallstones or wall thickening visualized. No sonographic Murphy sign noted by sonographer. Common bile duct: Diameter: 7 mm, upper limits of normal for patient's age Liver: No focal lesion identified. Within normal limits in parenchymal echogenicity. Portal vein is patent on color Doppler imaging with normal direction of blood flow towards the liver. Other: None. IMPRESSION: No gallstones or ductal dilatation. Electronically Signed   By: Jill Side M.D.   On: 03/18/2022 19:23   DG Chest Port 1 View  Result Date: 03/18/2022 CLINICAL DATA:  Shortness of breath EXAM: PORTABLE CHEST - 1 VIEW COMPARISON:  06/19/2020 FINDINGS: Cardiomediastinal silhouette and pulmonary vasculature are within normal limits. Median sternotomy changes again seen. Right humeral fixation hardware partially visualized. Mild linear opacity at the left lateral lung base likely due to atelectasis. Lungs otherwise clear. IMPRESSION: No acute cardiopulmonary process. Electronically Signed   By: Miachel Roux M.D.   On: 03/18/2022 18:52      Scheduled Meds:  aspirin EC  325 mg Oral Daily   carvedilol  12.5 mg Oral BID WC   Chlorhexidine Gluconate Cloth  6 each Topical Daily   insulin aspart  0-5 Units Subcutaneous QHS   insulin aspart  0-9 Units Subcutaneous TID WC   insulin detemir  6 Units Subcutaneous Daily   lamoTRIgine  150 mg Oral Daily   lipase/protease/amylase  12,000 Units Oral Daily   loratadine  10 mg Oral  Daily   mirtazapine  30 mg Oral QHS   pantoprazole  40 mg Oral Daily   thyroid  60 mg Oral Q0600   Continuous Infusions:  sodium chloride 10 mL/hr at 03/20/22 0700   dextrose 5% lactated ringers 125 mL/hr at 03/20/22 0700   insulin 1.3 Units/hr (03/20/22 0805)   lactated ringers Stopped (03/19/22 [id number])     LOS: 2 days   Time spent: 25 minutes   Dessa Phi, DO [facility] 03/20/2022, 9:52 AM   Available via  Epic secure chat 7am-7pm After these hours, please refer to coverage provider listed on [email or web address]

## 2022-03-21 DIAGNOSIS — E1010 Type 1 diabetes mellitus with ketoacidosis without coma: Secondary | ICD-10-CM | POA: Diagnosis not present

## 2022-03-21 LAB — GLUCOSE, CAPILLARY
Glucose-Capillary: 292 mg/dL — ABNORMAL HIGH (ref 70–99)
Glucose-Capillary: 170 mg/dL — ABNORMAL HIGH (ref 70–99)

## 2022-03-21 LAB — BASIC METABOLIC PANEL
Anion gap: 7 (ref 5–15)
BUN: 58 mg/dL — ABNORMAL HIGH (ref 8–23)
CO2: 17 mmol/L — ABNORMAL LOW (ref 22–32)
Calcium: 7.6 mg/dL — ABNORMAL LOW (ref 8.9–10.3)
Chloride: 99 mmol/L (ref 98–111)
Creatinine, Ser: 2.33 mg/dL — ABNORMAL HIGH (ref 0.44–1.00)
GFR, Estimated: 22 mL/min — ABNORMAL LOW (ref >60)
Glucose, Bld: 212 mg/dL — ABNORMAL HIGH (ref 70–99)
Potassium: 3.7 mmol/L (ref 3.5–5.1)
Sodium: 123 mmol/L — ABNORMAL LOW (ref 135–145)

## 2022-03-21 LAB — CBC
HCT: 24.2 % — ABNORMAL LOW (ref 36.0–46.0)
Hemoglobin: 8.2 g/dL — ABNORMAL LOW (ref 12.0–15.0)
MCH: 28.1 pg (ref 26.0–34.0)
MCHC: 33.9 g/dL (ref 30.0–36.0)
MCV: 82.9 fL (ref 80.0–100.0)
Platelets: 304 10*3/uL (ref 150–400)
RBC: 2.92 MIL/uL — ABNORMAL LOW (ref 3.87–5.11)
RDW: 14.3 % (ref 11.5–15.5)
WBC: 7 10*3/uL (ref 4.0–10.5)
nRBC: 0 % (ref 0.0–0.2)

## 2022-03-21 LAB — LAMOTRIGINE LEVEL: Lamotrigine Lvl: 1.2 ug/mL — ABNORMAL LOW (ref 2.0–20.0)

## 2022-03-21 LAB — HEPATIC FUNCTION PANEL
ALT: 73 U/L — ABNORMAL HIGH (ref 0–44)
AST: 29 U/L (ref 15–41)
Albumin: 2.5 g/dL — ABNORMAL LOW (ref 3.5–5.0)
Alkaline Phosphatase: 263 U/L — ABNORMAL HIGH (ref 38–126)
Bilirubin, Direct: 0.1 mg/dL (ref 0.0–0.2)
Indirect Bilirubin: 0.5 mg/dL (ref 0.3–0.9)
Total Bilirubin: 0.6 mg/dL (ref 0.3–1.2)
Total Protein: 5.1 g/dL — ABNORMAL LOW (ref 6.5–8.1)

## 2022-03-21 MED ORDER — MELATONIN 3 MG PO TABS
3.0000 mg | ORAL_TABLET | Freq: Every evening | ORAL | Status: DC | PRN
  Administered 2022-03-21: 3 mg via ORAL
  Filled 2022-03-21: qty 1

## 2022-03-21 NOTE — Discharge Summary (Signed)
Physician Discharge Summary  Atiyana Pace [id number] DOB: 04/29/1955 DOA: 03/18/2022  PCP: [name], MD  Admit date: 03/18/2022 Discharge date: 03/21/2022  Admitted From: Home Disposition:  Home   Recommendations for Outpatient Follow-up:  Follow up with PCP in 1 week Follow up with [name] Follow up with Endocrinology   Discharge Condition: Stable CODE STATUS: DNR  Diet recommendation: Heart healthy/carb modified   Brief/Interim Summary: Brittney Pace is a 67 y.o. 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     Patient was seen by [name] in office visit on 2/22.  At that time, she was appearing unwell, blood work revealed hyponatremia, AKI, elevated liver enzymes.  She was referred to be evaluated in the ED.  She was admitted to [facility] and awaiting transfer.  However, patient did not want to wait and signed out [place] prior to transferring to [facility].   She returned to [place] long ED on 2/23.  Continues to feel fatigued, weak.  She was found to be in DKA with hyponatremia, hyperkalemia, metabolic acidosis with elevated anion gap, elevated LFTs.  Patient was started on IV fluid and IV insulin.  Patient's anion gap, hyperglycemia, transaminitis, acute kidney injury continue to improve.  IV insulin was transition to subcutaneous insulin.  Discharge Diagnoses:   Principal Problem:   DKA, type 1 ([place]) Active Problems:   CAD (coronary artery disease)   Anemia due to stage 3 chronic kidney disease (HCC)   Depression   Acquired hypothyroidism   Transaminitis   Hyponatremia   Hyperkalemia   Acute kidney injury superimposed on chronic kidney disease (HCC)   HTN (hypertension)    DKA -Anion gap closed. Transitioned to subq insulin     AKI on CKD stage 4 -Baseline creatinine 2.3 -Followed by nephrology at [facility]. -Renal ultrasound consistent with chronic medical renal disease, small simple cyst right kidney, can follow-up with outpatient MRI -Now back to baseline, resume torsemide    Elevated liver enzymes -Previously referred to GI due to positive hepatitis B antigen.  Most recent progress note reviewed today.  Repeat hepatitis panel has been negative. -Abdominal ultrasound unremarkable for cirrhosis or fatty liver -Elevated liver enzymes may be secondary to dehydration versus medication use versus congestive hepatopathy in setting of elevated BNP -LFTs are improving   Chronic hyponatremia -Insetting of DKA -Stable    Elevated BNP -Echo without evidence of CHF    Hypothyroidism -Armour Thyroid   Depression -Lamictal   Anemia of chronic disease -Followed by hematology, [name] -Follow up for iron infusion    CAD status post CABG -Stable.  Aspirin, Coreg  Discharge Instructions  Discharge Instructions     Call MD for:  difficulty breathing, headache or visual disturbances   Complete by: As directed    Call MD for:  extreme fatigue   Complete by: As directed    Call MD for:  persistant dizziness or light-headedness   Complete by: As directed    Call MD for:  persistant nausea and vomiting   Complete by: As directed    Call MD for:  severe uncontrolled pain   Complete by: As directed    Call MD for:  temperature >100.4   Complete by: As directed    Diet Carb Modified   Complete by: As directed  Discharge instructions   Complete by: As directed    You were cared for by a hospitalist during your hospital stay. If you have any questions about your discharge medications or the care you received while you were in the hospital after you are discharged, you can call the unit and ask to speak with the hospitalist on call if the hospitalist that took care of you is not available.  Once you are discharged, your primary care physician will handle any further medical issues. Please note that NO REFILLS for any discharge medications will be authorized once you are discharged, as it is imperative that you return to your primary care physician (or establish a relationship with a primary care physician if you do not have one) for your aftercare needs so that they can reassess your need for medications and monitor your lab values.   Increase activity slowly   Complete by: As directed       Allergies as of 03/21/2022       Reactions   Morphine Anaphylaxis, Shortness Of Breath   Penicillins Hives   Tolerated ANCEF on 04/30/20 Has patient had a PCN reaction causing immediate rash, facial/tongue/throat swelling, SOB or lightheadedness with hypotension: Yes Has patient had a PCN reaction causing severe rash involving mucus membranes or skin necrosis: No Has patient had a PCN reaction that required hospitalization No Has patient had a PCN reaction occurring within the last 10 years: No If all of the above answers are "NO", then may proceed with Cephalosporin use.   Tramadol Anaphylaxis   Acetaminophen Other (See Comments)   Alters insulin pump readings   Amitriptyline Other (See Comments)   Severe headache/ out of body feeling   Codeine Other (See Comments)   Severe headaches/ out of body feeling   Ibuprofen Other (See Comments)   Messes up CGM reading on glucose monitor   Krill Oil Diarrhea, Itching, Nausea And Vomiting   Losartan Cough   Propoxyphene Other (See Comments)   Severe headaches / out of body feeling   Shellfish Allergy Diarrhea, Nausea And Vomiting   Statins Other (See Comments)   Muscle pains Other reaction(s): Other   Sulfamethoxazole Other (See Comments)   Mouth ulcers   Sulfites Itching, Other (See Comments)   Mouth ulcers   Fluconazole    Other reaction(s): Contact Dermatitis (intolerance)   Norvasc [amlodipine Besylate] Swelling         Medication List     TAKE these medications    Accu-Chek Guide test strip Generic drug: glucose blood 3 (three) times daily.   AMBULATORY NON FORMULARY MEDICATION Incontinence pads per patient preference, #unlimited, refill [phone] Fax to [phone]   Armour Thyroid 60 MG tablet Generic drug: thyroid Take 1 tablet by mouth daily.   aspirin EC 325 MG tablet Take 1 tablet (325 mg total) by mouth daily.   carvedilol 12.5 MG tablet Commonly known as: COREG Take 1 tablet (12.5 mg total) by mouth 2 (two) times daily with a meal.   cetirizine 10 MG tablet Commonly known as: ZYRTEC Take 10 mg by mouth daily.   Creon 3000-9500 units Cpep Generic drug: Pancrelipase (Lip-Prot-Amyl) Take 12,000 Units by mouth daily.   Dexcom G6 Sensor Misc USE TO CONTINUOUSLY MONITOR BLOOD SUGARS. CHANGE EVERY 10 DAYS   diphenoxylate-atropine 2.5-0.025 MG tablet Commonly known as: LOMOTIL TAKE 1 TABLET BY MOUTH 4 TIMES DAILY AS NEEDED FOR DIARRHEA OR  LOOSE  STOOLS What changed: See the new instructions.   esomeprazole 20 MG  capsule Commonly known as: NEXIUM Take 20 mg by mouth every morning.   hydrALAZINE 25 MG tablet Commonly known as: APRESOLINE Take 1 tablet (25 mg total) by mouth 3 (three) times daily.   insulin pump Soln Inject into the skin continuous. Novolog insulin   lamoTRIgine 150 MG tablet Commonly known as: LAMICTAL Take 1 tablet by mouth daily.   Melatonin Gummies 2.5 MG Chew Chew by mouth at bedtime as needed.   mirtazapine 30 MG tablet Commonly known as: REMERON Take 30 mg by mouth at bedtime.   NovoLOG 100 UNIT/ML injection Generic drug: insulin aspart Inject 40 Units into the skin daily.   Premarin vaginal cream Generic drug: conjugated estrogens every other day.   torsemide 20 MG tablet Commonly known as: [name] TAKE 1 TABLET ONCE DAILY INTHE MORNING. MAY TAKE AN   EXTRA 1/2 TABLET AS        NEEDED   valACYclovir 500 MG tablet Commonly known as:  VALTREX Take 500 mg by mouth daily as needed (Flair up).        Follow-up Information     [name], MD Follow up.   Specialty: Oncology Contact information: [address] [place] [address] [phone]                Allergies  Allergen Reactions   Morphine Anaphylaxis and Shortness Of Breath   Penicillins Hives    Tolerated ANCEF on 04/30/20 Has patient had a PCN reaction causing immediate rash, facial/tongue/throat swelling, SOB or lightheadedness with hypotension: Yes Has patient had a PCN reaction causing severe rash involving mucus membranes or skin necrosis: No Has patient had a PCN reaction that required hospitalization No Has patient had a PCN reaction occurring within the last 10 years: No If all of the above answers are "NO", then may proceed with Cephalosporin use.   Tramadol Anaphylaxis   Acetaminophen Other (See Comments)    Alters insulin pump readings    Amitriptyline Other (See Comments)    Severe headache/ out of body feeling   Codeine Other (See Comments)    Severe headaches/ out of body feeling    Ibuprofen Other (See Comments)    Messes up CGM reading on glucose monitor     Krill Oil Diarrhea, Itching and Nausea And Vomiting   Losartan Cough   Propoxyphene Other (See Comments)    Severe headaches / out of body feeling    Shellfish Allergy Diarrhea and Nausea And Vomiting   Statins Other (See Comments)    Muscle pains Other reaction(s): Other   Sulfamethoxazole Other (See Comments)    Mouth ulcers    Sulfites Itching and Other (See Comments)    Mouth ulcers   Fluconazole     Other reaction(s): Contact Dermatitis (intolerance)   Norvasc [Amlodipine Besylate] Swelling     Procedures/Studies: ECHOCARDIOGRAM COMPLETE  Result Date: 03/19/2022    ECHOCARDIOGRAM REPORT   Patient Name:   [facility] Date of Exam: 03/19/2022 Medical Rec #:  [id number]        Height:       58.0 in Accession #:    [id number]        Weight:       117.5 lb Date of Birth:  10-03-1955        BSA:          1.453 m Patient Age:    78 years         BP:  145/95 mmHg Patient Gender: F                HR:           67 bpm. Exam Location:  Inpatient Procedure: 2D Echo Indications:    elevated BNP  History:        Patient has prior history of Echocardiogram examinations, most                 recent 01/25/2018. CAD; Risk Factors:Diabetes, Dyslipidemia and                 Hypertension.  Sonographer:    Harvie Junior Referring Phys: [id number] [place]  1. Left ventricular ejection fraction, by estimation, is 55 to 60%. The left ventricle has normal function. The left ventricle has no regional wall motion abnormalities. Left ventricular diastolic parameters were normal.  2. Right ventricular systolic function is mildly reduced. The right ventricular size is mildly enlarged. There is mildly elevated pulmonary artery systolic pressure. The estimated right ventricular systolic pressure is 0000000 mmHg.  3. The mitral valve is degenerative. Trivial mitral valve regurgitation.  4. The aortic valve is tricuspid. There is mild calcification of the aortic valve. Aortic valve regurgitation is not visualized. Aortic valve sclerosis is present, with no evidence of aortic valve stenosis. Aortic valve mean gradient measures 5.0 mmHg.  5. The inferior vena cava is normal in size with <50% respiratory variability, suggesting right atrial pressure of 8 mmHg. Comparison(s): No prior Echocardiogram. FINDINGS  Left Ventricle: Left ventricular ejection fraction, by estimation, is 55 to 60%. The left ventricle has normal function. The left ventricle has no regional wall motion abnormalities. The left ventricular internal cavity size was normal in size. There is  no left ventricular hypertrophy. Left ventricular diastolic parameters were normal. Right Ventricle: The right ventricular size is mildly enlarged. No increase in right ventricular wall thickness.  Right ventricular systolic function is mildly reduced. There is mildly elevated pulmonary artery systolic pressure. The tricuspid regurgitant  velocity is 2.95 m/s, and with an assumed right atrial pressure of 8 mmHg, the estimated right ventricular systolic pressure is 0000000 mmHg. Left Atrium: Left atrial size was normal in size. Right Atrium: Right atrial size was normal in size. Pericardium: There is no evidence of pericardial effusion. Mitral Valve: The mitral valve is degenerative in appearance. There is mild calcification of the mitral valve leaflet(s). Mild mitral annular calcification. Trivial mitral valve regurgitation. Tricuspid Valve: The tricuspid valve is grossly normal. Tricuspid valve regurgitation is mild. Aortic Valve: The aortic valve is tricuspid. There is mild calcification of the aortic valve. There is mild aortic valve annular calcification. Aortic valve regurgitation is not visualized. Aortic valve sclerosis is present, with no evidence of aortic valve stenosis. Aortic valve mean gradient measures 5.0 mmHg. Aortic valve peak gradient measures 9.5 mmHg. Aortic valve area, by VTI measures 2.37 cm. Pulmonic Valve: The pulmonic valve was grossly normal. Pulmonic valve regurgitation is mild to moderate. Aorta: The aortic root is normal in size and structure. Venous: The inferior vena cava is normal in size with less than 50% respiratory variability, suggesting right atrial pressure of 8 mmHg. IAS/Shunts: No atrial level shunt detected by color flow Doppler.  LEFT VENTRICLE PLAX 2D LVIDd:         3.90 cm     Diastology LVIDs:         2.50 cm     LV e' medial:    7.51 cm/s LV PW:  0.80 cm     LV E/e' medial:  17.7 LV IVS:        0.80 cm     LV e' lateral:   13.60 cm/s LVOT diam:     2.00 cm     LV E/e' lateral: 9.8 LV SV:         80 LV SV Index:   55 LVOT Area:     3.14 cm                             3D Volume EF: LV Volumes (MOD)           3D EF:        56 % LV vol d, MOD A4C: 91.6 ml LV  EDV:       92 ml LV vol s, MOD A4C: 28.9 ml LV ESV:       40 ml LV SV MOD A4C:     91.6 ml LV SV:        52 ml RIGHT VENTRICLE RV Basal diam:  3.00 cm RV Mid diam:    2.70 cm RV S prime:     9.68 cm/s TAPSE (M-mode): 1.2 cm LEFT ATRIUM           Index        RIGHT ATRIUM           Index LA diam:      3.40 cm 2.34 cm/m   RA Area:     10.10 cm LA Vol (A4C): 43.8 ml 30.15 ml/m  RA Volume:   18.30 ml  12.60 ml/m  AORTIC VALVE                     PULMONIC VALVE AV Area (Vmax):    2.64 cm      PV Vmax:          1.21 m/s AV Area (Vmean):   2.40 cm      PV Peak grad:     5.9 mmHg AV Area (VTI):     2.37 cm      PR End Diast Vel: 7.51 msec AV Vmax:           154.50 cm/s AV Vmean:          107.000 cm/s AV VTI:            0.340 m AV Peak Grad:      9.5 mmHg AV Mean Grad:      5.0 mmHg LVOT Vmax:         130.00 cm/s LVOT Vmean:        81.800 cm/s LVOT VTI:          0.256 m LVOT/AV VTI ratio: 0.75  AORTA Ao Root diam: 2.50 cm Ao Asc diam:  2.90 cm MITRAL VALVE                TRICUSPID VALVE MV Area (PHT): 3.85 cm     TR Peak grad:   34.8 mmHg MV Decel Time: 197 msec     TR Vmax:        295.00 cm/s MR Peak grad: 24.2 mmHg MR Vmax:      246.00 cm/s   SHUNTS MV E velocity: 133.00 cm/s  Systemic VTI:  0.26 m MV A velocity: 99.40 cm/s   Systemic Diam: 2.00 cm MV E/A ratio:  1.34 Rozann Lesches MD Electronically signed by Rozann Lesches MD  Signature Date/Time: 03/19/2022/3:03:45 PM    Final    US RENAL  Result Date: 03/18/2022 CLINICAL DATA:  Urinary retention. EXAM: RENAL / URINARY TRACT ULTRASOUND COMPLETE COMPARISON:  Abdominal MRI 09/26/2020, CT 09/18/2020 FINDINGS: Right Kidney: Renal measurements: 9.5 x 4.3 x 5 cm = volume: 108 mL. No hydronephrosis. Diffusely increased renal parenchymal echogenicity in thinning of the renal parenchyma. Simple cyst in the mid right kidney measures 1.2 cm. Complex cyst in the medial kidney measures 1.4 x 1.2 x 1.2 cm. Trace perinephric fluid. No evidence of renal calculi. Left  Kidney: Renal measurements: 10.2 x 4.5 x 4.8 cm = volume: 115 mL. No hydronephrosis. Increased renal parenchymal echogenicity in thinning of the renal parenchyma. Simple cyst on prior MRI are not well demonstrated. No evidence of solid lesion. No visible renal stone. Trace perinephric fluid. Bladder: Mild diffuse bladder wall thickening at 5 mm. Moderate bladder distension. Both ureteral jets are demonstrated. Other: None. IMPRESSION: 1. No obstructive uropathy. 2. Increased bilateral renal parenchymal echogenicity typical of chronic medical renal disease. Trace bilateral perinephric fluid is likely chronic. 3. Small simple cysts in the right kidney. Septated/complex right renal cyst on prior MRI is difficult to delineate by ultrasound. MRI follow-up is recommended on prior abdominal MRI, recommend this be performed on an elective basis in the near future not performed elsewhere. 4. Mild diffuse bladder wall thickening. Moderate bladder distension. Electronically Signed   By: Keith Rake M.D.   On: 03/18/2022 22:59   US Abdomen Limited RUQ (LIVER/GB)  Result Date: 03/18/2022 CLINICAL DATA:  Right upper quadrant abdominal pain for 2 days EXAM: ULTRASOUND ABDOMEN LIMITED RIGHT UPPER QUADRANT COMPARISON:  MRI 09/26/2020.  CT scan 09/18/2020 FINDINGS: Gallbladder: No gallstones or wall thickening visualized. No sonographic Murphy sign noted by sonographer. Common bile duct: Diameter: 7 mm, upper limits of normal for patient's age Liver: No focal lesion identified. Within normal limits in parenchymal echogenicity. Portal vein is patent on color Doppler imaging with normal direction of blood flow towards the liver. Other: None. IMPRESSION: No gallstones or ductal dilatation. Electronically Signed   By: Jill Side M.D.   On: 03/18/2022 19:23   DG Chest Port 1 View  Result Date: 03/18/2022 CLINICAL DATA:  Shortness of breath EXAM: PORTABLE CHEST - 1 VIEW COMPARISON:  06/19/2020 FINDINGS: Cardiomediastinal  silhouette and pulmonary vasculature are within normal limits. Median sternotomy changes again seen. Right humeral fixation hardware partially visualized. Mild linear opacity at the left lateral lung base likely due to atelectasis. Lungs otherwise clear. IMPRESSION: No acute cardiopulmonary process. Electronically Signed   By: Miachel Roux M.D.   On: 03/18/2022 18:52      Discharge Exam: Vitals:   03/20/22 2052 03/21/22 0554  BP: (!) 152/71 (!) 169/73  Pulse: 66 66  Resp: 18 18  Temp: 98.4 F (36.9 C) 97.9 F (36.6 C)  SpO2: 96% 96%    General: Pt is alert, awake, not in acute distress Cardiovascular: RRR, S1/S2 +, no pedal edema Respiratory: CTA bilaterally, no wheezing, no rhonchi, no respiratory distress, no conversational dyspnea  Abdominal: Soft, NT, ND, bowel sounds + Extremities: no edema, no cyanosis Psych: Normal mood and affect, stable judgement and insight     The results of significant diagnostics from this hospitalization (including imaging, microbiology, ancillary and laboratory) are listed below for reference.     Microbiology: Recent Results (from the past 240 hour(s))  Resp panel by RT-PCR (RSV, Flu A&B, Covid) Nasopharyngeal Swab     Status: None  Collection Time: 03/17/22  9:09 PM   Specimen: Nasopharyngeal Swab; Nasal Swab  Result Value Ref Range Status   SARS Coronavirus 2 by RT PCR NEGATIVE NEGATIVE Final    Comment: (NOTE) SARS-CoV-2 target nucleic acids are NOT DETECTED.  The SARS-CoV-2 RNA is generally detectable in upper respiratory specimens during the acute phase of infection. The lowest concentration of SARS-CoV-2 viral copies this assay can detect is 138 copies/mL. A negative result does not preclude SARS-Cov-2 infection and should not be used as the sole basis for treatment or other patient management decisions. A negative result may occur with  improper specimen collection/handling, submission of specimen other than nasopharyngeal swab,  presence of viral mutation(s) within the areas targeted by this assay, and inadequate number of viral copies(<138 copies/mL). A negative result must be combined with clinical observations, patient history, and epidemiological information. The expected result is Negative.  Fact Sheet for Patients:  EntrepreneurPulse.com.au  Fact Sheet for Healthcare Providers:  IncredibleEmployment.be  This test is no t yet approved or cleared by the [place] FDA and  has been authorized for detection and/or diagnosis of SARS-CoV-2 by FDA under an Emergency Use Authorization (EUA). This EUA will remain  in effect (meaning this test can be used) for the duration of the COVID-19 declaration under Section 564(b)(1) of the Act, 21 U.S.C.section 360bbb-3(b)(1), unless the authorization is terminated  or revoked sooner.       Influenza A by PCR NEGATIVE NEGATIVE Final   Influenza B by PCR NEGATIVE NEGATIVE Final    Comment: (NOTE) The Xpert Xpress SARS-CoV-2/FLU/RSV plus assay is intended as an aid in the diagnosis of influenza from Nasopharyngeal swab specimens and should not be used as a sole basis for treatment. Nasal washings and aspirates are unacceptable for Xpert Xpress SARS-CoV-2/FLU/RSV testing.  Fact Sheet for Patients: EntrepreneurPulse.com.au  Fact Sheet for Healthcare Providers: IncredibleEmployment.be  This test is not yet approved or cleared by the [place] FDA and has been authorized for detection and/or diagnosis of SARS-CoV-2 by FDA under an Emergency Use Authorization (EUA). This EUA will remain in effect (meaning this test can be used) for the duration of the COVID-19 declaration under Section 564(b)(1) of the Act, 21 U.S.C. section 360bbb-3(b)(1), unless the authorization is terminated or revoked.     Resp Syncytial Virus by PCR NEGATIVE NEGATIVE Final    Comment: (NOTE) Fact Sheet for  Patients: EntrepreneurPulse.com.au  Fact Sheet for Healthcare Providers: IncredibleEmployment.be  This test is not yet approved or cleared by the [place] FDA and has been authorized for detection and/or diagnosis of SARS-CoV-2 by FDA under an Emergency Use Authorization (EUA). This EUA will remain in effect (meaning this test can be used) for the duration of the COVID-19 declaration under Section 564(b)(1) of the Act, 21 U.S.C. section 360bbb-3(b)(1), unless the authorization is terminated or revoked.  Performed at [facility], [address] [place], [place] [address]   MRSA Next Gen by PCR, Nasal     Status: None   Collection Time: 03/19/22 12:40 PM   Specimen: Nasal Mucosa; Nasal Swab  Result Value Ref Range Status   MRSA by PCR Next Gen NOT DETECTED NOT DETECTED Final    Comment: (NOTE) The GeneXpert MRSA Assay (FDA approved for NASAL specimens only), is one component of a comprehensive MRSA colonization surveillance program. It is not intended to diagnose MRSA infection nor to guide or monitor treatment for MRSA infections. Test performance is not FDA approved in patients less than  69 years old. Performed at [facility], [place] [address], [place], [place] [address]      Labs: BNP (last 3 results) Recent Labs    03/18/22 1910 03/20/22 0618  BNP 1,266.8* [id number]   Basic Metabolic Panel: Recent Labs  Lab 03/19/22 2055 03/20/22 0102 03/20/22 0618 03/20/22 0944 03/21/22 0324  NA 122* 123* 125* 125* 123*  K 4.2 3.9 3.7 3.7 3.7  CL 92* 95* 95* 94* 99  CO2 17* 19* 18* 19* 17*  GLUCOSE 139* 153* 158* 157* 212*  BUN 65* 71* 62* 61* 58*  CREATININE 3.25* 3.22* 2.96* 2.89* 2.33*  CALCIUM 8.0* 7.9* 8.3* 8.4* 7.6*   Liver Function Tests: Recent Labs  Lab 03/17/22 1928 03/18/22 1910 03/19/22 0845 03/20/22 0618 03/21/22 0324  AST 79* 44* 39 32 29  ALT 171* 129* 113* 84* 73*  ALKPHOS 497*  398* 348* 288* 263*  BILITOT 1.1 2.0* 1.3* 0.6 0.6  PROT 6.2* 5.7* 5.5* 4.7* 5.1*  ALBUMIN 3.2* 3.1* 2.8* 2.4* 2.5*   Recent Labs  Lab 03/18/22 1910  LIPASE 28   No results for input(s): "AMMONIA" in the last 168 hours. CBC: Recent Labs  Lab 03/17/22 1445 03/17/22 1928 03/18/22 1910 03/20/22 0618 03/21/22 0324  WBC 5.9 7.4 6.6 11.6* 7.0  NEUTROABS 3.8  --  5.3  --   --   HGB 9.5* 9.5* 10.1* 8.6* 8.2*  HCT 28.9* 28.0* 32.9* 24.9* 24.2*  MCV 83.8 82.1 91.4 80.6 82.9  PLT 216 223 244 308 304   Cardiac Enzymes: No results for input(s): "CKTOTAL", "CKMB", "CKMBINDEX", "TROPONINI" in the last 168 hours. BNP: Invalid input(s): "POCBNP" CBG: Recent Labs  Lab 03/20/22 1239 03/20/22 1638 03/20/22 2054 03/21/22 0727 03/21/22 1154  GLUCAP 168* 126* 104* 292* 170*   D-Dimer No results for input(s): "DDIMER" in the last 72 hours. Hgb A1c No results for input(s): "HGBA1C" in the last 72 hours. Lipid Profile No results for input(s): "CHOL", "HDL", "LDLCALC", "TRIG", "CHOLHDL", "LDLDIRECT" in the last 72 hours. Thyroid function studies Recent Labs    03/18/22 1910  TSH 1.865   Anemia work up No results for input(s): "VITAMINB12", "FOLATE", "FERRITIN", "TIBC", "IRON", "RETICCTPCT" in the last 72 hours. Urinalysis    Component Value Date/Time   COLORURINE YELLOW 03/19/2022 1800   APPEARANCEUR HAZY (A) 03/19/2022 1800   LABSPEC 1.011 03/19/2022 1800   PHURINE 5.0 03/19/2022 1800   GLUCOSEU 50 (A) 03/19/2022 1800   HGBUR SMALL (A) 03/19/2022 1800   BILIRUBINUR NEGATIVE 03/19/2022 1800   BILIRUBINUR negative 12/15/2020 1544   BILIRUBINUR negative 02/14/2017 1611   KETONESUR 5 (A) 03/19/2022 1800   PROTEINUR 100 (A) 03/19/2022 1800   UROBILINOGEN 0.2 12/15/2020 1544   NITRITE NEGATIVE 03/19/2022 1800   LEUKOCYTESUR TRACE (A) 03/19/2022 1800   Sepsis Labs Recent Labs  Lab 03/17/22 1928 03/18/22 1910 03/20/22 0618 03/21/22 0324  WBC 7.4 6.6 11.6* 7.0    Microbiology Recent Results (from the past 240 hour(s))  Resp panel by RT-PCR (RSV, Flu A&B, Covid) Nasopharyngeal Swab     Status: None   Collection Time: 03/17/22  9:09 PM   Specimen: Nasopharyngeal Swab; Nasal Swab  Result Value Ref Range Status   SARS Coronavirus 2 by RT PCR NEGATIVE NEGATIVE Final    Comment: (NOTE) SARS-CoV-2 target nucleic acids are NOT DETECTED.  The SARS-CoV-2 RNA is generally detectable in upper respiratory specimens during the acute phase of infection. The lowest concentration of SARS-CoV-2 viral copies this assay can detect is  138 copies/mL. A negative result does not preclude SARS-Cov-2 infection and should not be used as the sole basis for treatment or other patient management decisions. A negative result may occur with  improper specimen collection/handling, submission of specimen other than nasopharyngeal swab, presence of viral mutation(s) within the areas targeted by this assay, and inadequate number of viral copies(<138 copies/mL). A negative result must be combined with clinical observations, patient history, and epidemiological information. The expected result is Negative.  Fact Sheet for Patients:  EntrepreneurPulse.com.au  Fact Sheet for Healthcare Providers:  IncredibleEmployment.be  This test is no t yet approved or cleared by the [place] FDA and  has been authorized for detection and/or diagnosis of SARS-CoV-2 by FDA under an Emergency Use Authorization (EUA). This EUA will remain  in effect (meaning this test can be used) for the duration of the COVID-19 declaration under Section 564(b)(1) of the Act, 21 U.S.C.section 360bbb-3(b)(1), unless the authorization is terminated  or revoked sooner.       Influenza A by PCR NEGATIVE NEGATIVE Final   Influenza B by PCR NEGATIVE NEGATIVE Final    Comment: (NOTE) The Xpert Xpress SARS-CoV-2/FLU/RSV plus assay is intended as an aid in the diagnosis  of influenza from Nasopharyngeal swab specimens and should not be used as a sole basis for treatment. Nasal washings and aspirates are unacceptable for Xpert Xpress SARS-CoV-2/FLU/RSV testing.  Fact Sheet for Patients: EntrepreneurPulse.com.au  Fact Sheet for Healthcare Providers: IncredibleEmployment.be  This test is not yet approved or cleared by the [place] FDA and has been authorized for detection and/or diagnosis of SARS-CoV-2 by FDA under an Emergency Use Authorization (EUA). This EUA will remain in effect (meaning this test can be used) for the duration of the COVID-19 declaration under Section 564(b)(1) of the Act, 21 U.S.C. section 360bbb-3(b)(1), unless the authorization is terminated or revoked.     Resp Syncytial Virus by PCR NEGATIVE NEGATIVE Final    Comment: (NOTE) Fact Sheet for Patients: EntrepreneurPulse.com.au  Fact Sheet for Healthcare Providers: IncredibleEmployment.be  This test is not yet approved or cleared by the [place] FDA and has been authorized for detection and/or diagnosis of SARS-CoV-2 by FDA under an Emergency Use Authorization (EUA). This EUA will remain in effect (meaning this test can be used) for the duration of the COVID-19 declaration under Section 564(b)(1) of the Act, 21 U.S.C. section 360bbb-3(b)(1), unless the authorization is terminated or revoked.  Performed at [facility], [address] [place], [place] [address]   MRSA Next Gen by PCR, Nasal     Status: None   Collection Time: 03/19/22 12:40 PM   Specimen: Nasal Mucosa; Nasal Swab  Result Value Ref Range Status   MRSA by PCR Next Gen NOT DETECTED NOT DETECTED Final    Comment: (NOTE) The GeneXpert MRSA Assay (FDA approved for NASAL specimens only), is one component of a comprehensive MRSA colonization surveillance program. It is not intended to diagnose MRSA infection nor to  guide or monitor treatment for MRSA infections. Test performance is not FDA approved in patients less than 58 years old. Performed at [facility], [place] [address], [place], [place] [address]      Patient was seen and examined on the day of discharge and was found to be in stable condition. Time coordinating discharge: 25 minutes including assessment and coordination of care, as well as examination of the patient.   SIGNED:  Dessa Phi, DO [facility] 03/21/2022, 11:58 AM

## 2022-03-22 ENCOUNTER — Telehealth: Payer: Self-pay

## 2022-03-22 ENCOUNTER — Encounter: Payer: Self-pay | Admitting: Cardiology

## 2022-03-22 LAB — T3, FREE: T3, Free: 1.9 pg/mL — ABNORMAL LOW (ref 2.0–4.4)

## 2022-03-22 NOTE — Transitions of Care (Post Inpatient/ED Visit) (Signed)
   03/22/2022  Name: Brittney Pace MRN: [id number] DOB: 1955-11-06  Today's TOC FU Call Status: Today's TOC FU Call Status:: Successful TOC FU Call Competed TOC FU Call Complete Date: 03/22/22  Transition Care Management Follow-up Telephone Call Date of Discharge: 03/21/22 Discharge Facility: [facility]) Type of Discharge: Inpatient Admission Primary Inpatient Discharge Diagnosis:: Diabetic ketoacidosis How have you been since you were released from the hospital?: Better (I am tried and had some swelling in my legs that is better today.) Any questions or concerns?: No  Items Reviewed: Did you receive and understand the discharge instructions provided?: Yes Medications obtained and verified?: Yes (Medications Reviewed) Any new allergies since your discharge?: No Dietary orders reviewed?: NA Do you have support at home?: Yes People in Home: spouse Name of Support/Comfort Primary Source: husband [place] and Equipment/Supplies: [place] Ordered?: NA Any new equipment or medical supplies ordered?: NA  Functional Questionnaire: Do you need assistance with bathing/showering or dressing?: No Do you need assistance with meal preparation?: No Do you need assistance with eating?: No Do you have difficulty maintaining continence: No Do you need assistance with getting out of bed/getting out of a chair/moving?: No Do you have difficulty managing or taking your medications?: No  Folllow up appointments reviewed: PCP Follow-up appointment confirmed?: No (Followed by doctor at [facility] center but would like to find another doctor) MD Provider Line Number:[phone] Given: Yes [facility] Follow-up appointment confirmed?: Yes Date of Specialist follow-up appointment?: 03/24/22 Follow-Up Specialty Provider:: Dr. Hermelinda Pace endocrinologist Do you need transportation to your follow-up appointment?: No Do you understand care options if your  condition(s) worsen?: Yes-patient verbalized understanding  SDOH Interventions Today    Flowsheet Row Most Recent Value  SDOH Interventions   Food Insecurity Interventions Intervention Not Indicated  Housing Interventions Intervention Not Indicated  Transportation Interventions Intervention Not Indicated  Utilities Interventions Intervention Not Indicated      TOC Interventions Today    Flowsheet Row Most Recent Value  TOC Interventions   TOC Interventions Discussed/Reviewed TOC Interventions Discussed       Interventions Today    Flowsheet Row Most Recent Value  Chronic Disease   Chronic disease during today's visit Diabetes  General Interventions   General Interventions Discussed/Reviewed Doctor Visits  Doctor Visits Discussed/Reviewed Doctor Visits Discussed, PCP  Brittney Pace number for MD referral line to find new PCP]  Education Interventions   Education Provided Provided Education  Provided Verbal Education On When to see the doctor, Nutrition, Blood Sugar Monitoring, Sick Day Rules, Other  [Reviewed importance of follow up with provider post hospitalization]        SIGNATURE Brittney Garter RN, BSN,CCM, CDE Care Management Coordinator [facility] [phone]

## 2022-03-27 ENCOUNTER — Inpatient Hospital Stay (HOSPITAL_COMMUNITY)
Admission: EM | Admit: 2022-03-27 | Discharge: 2022-03-30 | DRG: 291 | Disposition: A | Discharge status: Home / Self Care | Payer: Medicare Other | Attending: Internal Medicine | Admitting: Internal Medicine

## 2022-03-27 ENCOUNTER — Other Ambulatory Visit: Payer: Self-pay

## 2022-03-27 ENCOUNTER — Encounter (HOSPITAL_COMMUNITY): Payer: Self-pay

## 2022-03-27 ENCOUNTER — Emergency Department (HOSPITAL_COMMUNITY): Payer: Medicare Other

## 2022-03-27 DIAGNOSIS — Z8249 Family history of ischemic heart disease and other diseases of the circulatory system: Secondary | ICD-10-CM | POA: Diagnosis not present

## 2022-03-27 DIAGNOSIS — Z794 Long term (current) use of insulin: Secondary | ICD-10-CM

## 2022-03-27 DIAGNOSIS — M810 Age-related osteoporosis without current pathological fracture: Secondary | ICD-10-CM | POA: Diagnosis present

## 2022-03-27 DIAGNOSIS — E10319 Type 1 diabetes mellitus with unspecified diabetic retinopathy without macular edema: Secondary | ICD-10-CM | POA: Diagnosis present

## 2022-03-27 DIAGNOSIS — E1022 Type 1 diabetes mellitus with diabetic chronic kidney disease: Secondary | ICD-10-CM | POA: Diagnosis present

## 2022-03-27 DIAGNOSIS — Z8673 Personal history of transient ischemic attack (TIA), and cerebral infarction without residual deficits: Secondary | ICD-10-CM | POA: Diagnosis not present

## 2022-03-27 DIAGNOSIS — Z885 Allergy status to narcotic agent status: Secondary | ICD-10-CM

## 2022-03-27 DIAGNOSIS — D631 Anemia in chronic kidney disease: Secondary | ICD-10-CM | POA: Diagnosis present

## 2022-03-27 DIAGNOSIS — T502X5A Adverse effect of carbonic-anhydrase inhibitors, benzothiadiazides and other diuretics, initial encounter: Secondary | ICD-10-CM | POA: Diagnosis present

## 2022-03-27 DIAGNOSIS — J90 Pleural effusion, not elsewhere classified: Secondary | ICD-10-CM | POA: Diagnosis present

## 2022-03-27 DIAGNOSIS — Z66 Do not resuscitate: Secondary | ICD-10-CM | POA: Diagnosis present

## 2022-03-27 DIAGNOSIS — E1051 Type 1 diabetes mellitus with diabetic peripheral angiopathy without gangrene: Secondary | ICD-10-CM | POA: Diagnosis present

## 2022-03-27 DIAGNOSIS — E039 Hypothyroidism, unspecified: Secondary | ICD-10-CM | POA: Diagnosis not present

## 2022-03-27 DIAGNOSIS — Z955 Presence of coronary angioplasty implant and graft: Secondary | ICD-10-CM

## 2022-03-27 DIAGNOSIS — Z888 Allergy status to other drugs, medicaments and biological substances status: Secondary | ICD-10-CM

## 2022-03-27 DIAGNOSIS — E871 Hypo-osmolality and hyponatremia: Secondary | ICD-10-CM | POA: Diagnosis present

## 2022-03-27 DIAGNOSIS — M1990 Unspecified osteoarthritis, unspecified site: Secondary | ICD-10-CM | POA: Diagnosis present

## 2022-03-27 DIAGNOSIS — Z803 Family history of malignant neoplasm of breast: Secondary | ICD-10-CM

## 2022-03-27 DIAGNOSIS — I130 Hypertensive heart and chronic kidney disease with heart failure and stage 1 through stage 4 chronic kidney disease, or unspecified chronic kidney disease: Secondary | ICD-10-CM | POA: Diagnosis present

## 2022-03-27 DIAGNOSIS — Z951 Presence of aortocoronary bypass graft: Secondary | ICD-10-CM | POA: Diagnosis not present

## 2022-03-27 DIAGNOSIS — I5033 Acute on chronic diastolic (congestive) heart failure: Secondary | ICD-10-CM | POA: Diagnosis present

## 2022-03-27 DIAGNOSIS — Z91013 Allergy to seafood: Secondary | ICD-10-CM

## 2022-03-27 DIAGNOSIS — F32A Depression, unspecified: Secondary | ICD-10-CM | POA: Diagnosis present

## 2022-03-27 DIAGNOSIS — I10 Essential (primary) hypertension: Secondary | ICD-10-CM | POA: Diagnosis present

## 2022-03-27 DIAGNOSIS — I252 Old myocardial infarction: Secondary | ICD-10-CM

## 2022-03-27 DIAGNOSIS — N184 Chronic kidney disease, stage 4 (severe): Secondary | ICD-10-CM | POA: Diagnosis present

## 2022-03-27 DIAGNOSIS — E1042 Type 1 diabetes mellitus with diabetic polyneuropathy: Secondary | ICD-10-CM | POA: Diagnosis present

## 2022-03-27 DIAGNOSIS — I272 Pulmonary hypertension, unspecified: Secondary | ICD-10-CM | POA: Diagnosis present

## 2022-03-27 DIAGNOSIS — I2510 Atherosclerotic heart disease of native coronary artery without angina pectoris: Secondary | ICD-10-CM | POA: Diagnosis present

## 2022-03-27 DIAGNOSIS — Z880 Allergy status to penicillin: Secondary | ICD-10-CM

## 2022-03-27 DIAGNOSIS — Z79899 Other long term (current) drug therapy: Secondary | ICD-10-CM | POA: Diagnosis not present

## 2022-03-27 DIAGNOSIS — Z9641 Presence of insulin pump (external) (internal): Secondary | ICD-10-CM | POA: Diagnosis present

## 2022-03-27 DIAGNOSIS — E109 Type 1 diabetes mellitus without complications: Secondary | ICD-10-CM | POA: Diagnosis present

## 2022-03-27 DIAGNOSIS — M797 Fibromyalgia: Secondary | ICD-10-CM | POA: Diagnosis not present

## 2022-03-27 DIAGNOSIS — Z87891 Personal history of nicotine dependence: Secondary | ICD-10-CM

## 2022-03-27 DIAGNOSIS — I509 Heart failure, unspecified: Principal | ICD-10-CM

## 2022-03-27 DIAGNOSIS — Z79890 Hormone replacement therapy (postmenopausal): Secondary | ICD-10-CM

## 2022-03-27 DIAGNOSIS — Z882 Allergy status to sulfonamides status: Secondary | ICD-10-CM

## 2022-03-27 DIAGNOSIS — Z7982 Long term (current) use of aspirin: Secondary | ICD-10-CM

## 2022-03-27 LAB — COMPREHENSIVE METABOLIC PANEL
ALT: 34 U/L (ref 0–44)
AST: 23 U/L (ref 15–41)
Albumin: 3.5 g/dL (ref 3.5–5.0)
Alkaline Phosphatase: 280 U/L — ABNORMAL HIGH (ref 38–126)
Anion gap: 13 (ref 5–15)
BUN: 37 mg/dL — ABNORMAL HIGH (ref 8–23)
CO2: 20 mmol/L — ABNORMAL LOW (ref 22–32)
Calcium: 8.5 mg/dL — ABNORMAL LOW (ref 8.9–10.3)
Chloride: 97 mmol/L — ABNORMAL LOW (ref 98–111)
Creatinine, Ser: 1.97 mg/dL — ABNORMAL HIGH (ref 0.44–1.00)
GFR, Estimated: 27 mL/min — ABNORMAL LOW (ref >60)
Glucose, Bld: 222 mg/dL — ABNORMAL HIGH (ref 70–99)
Potassium: 3.8 mmol/L (ref 3.5–5.1)
Sodium: 130 mmol/L — ABNORMAL LOW (ref 135–145)
Total Bilirubin: 0.9 mg/dL (ref 0.3–1.2)
Total Protein: 7.1 g/dL (ref 6.5–8.1)

## 2022-03-27 LAB — CBC WITH DIFFERENTIAL/PLATELET
Abs Immature Granulocytes: 0.07 10*3/uL (ref 0.00–0.07)
Basophils Absolute: 0.1 10*3/uL (ref 0.0–0.1)
Basophils Relative: 1 %
Eosinophils Absolute: 1.3 10*3/uL — ABNORMAL HIGH (ref 0.0–0.5)
Eosinophils Relative: 13 %
HCT: 30.7 % — ABNORMAL LOW (ref 36.0–46.0)
Hemoglobin: 9.9 g/dL — ABNORMAL LOW (ref 12.0–15.0)
Immature Granulocytes: 1 %
Lymphocytes Relative: 10 %
Lymphs Abs: 1 10*3/uL (ref 0.7–4.0)
MCH: 27.7 pg (ref 26.0–34.0)
MCHC: 32.2 g/dL (ref 30.0–36.0)
MCV: 85.8 fL (ref 80.0–100.0)
Monocytes Absolute: 0.8 10*3/uL (ref 0.1–1.0)
Monocytes Relative: 8 %
Neutro Abs: 7 10*3/uL (ref 1.7–7.7)
Neutrophils Relative %: 67 %
Platelets: 506 10*3/uL — ABNORMAL HIGH (ref 150–400)
RBC: 3.58 MIL/uL — ABNORMAL LOW (ref 3.87–5.11)
RDW: 14.9 % (ref 11.5–15.5)
WBC: 10.3 10*3/uL (ref 4.0–10.5)
nRBC: 0 % (ref 0.0–0.2)

## 2022-03-27 LAB — URINALYSIS, ROUTINE W REFLEX MICROSCOPIC
Bacteria, UA: NONE SEEN
Bilirubin Urine: NEGATIVE
Glucose, UA: 50 mg/dL — AB
Hgb urine dipstick: NEGATIVE
Ketones, ur: 5 mg/dL — AB
Leukocytes,Ua: NEGATIVE
Nitrite: NEGATIVE
Protein, ur: >=300 mg/dL — AB
Specific Gravity, Urine: 1.01 (ref 1.005–1.030)
pH: 6 (ref 5.0–8.0)

## 2022-03-27 LAB — GLUCOSE, CAPILLARY: Glucose-Capillary: 149 mg/dL — ABNORMAL HIGH (ref 70–99)

## 2022-03-27 LAB — TROPONIN I (HIGH SENSITIVITY)
Troponin I (High Sensitivity): 17 ng/L (ref <18)
Troponin I (High Sensitivity): 15 ng/L (ref <18)

## 2022-03-27 LAB — BRAIN NATRIURETIC PEPTIDE: B Natriuretic Peptide: 583.5 pg/mL — ABNORMAL HIGH (ref 0.0–100.0)

## 2022-03-27 LAB — CBG MONITORING, ED: Glucose-Capillary: 257 mg/dL — ABNORMAL HIGH (ref 70–99)

## 2022-03-27 MED ORDER — ENOXAPARIN SODIUM 30 MG/0.3ML IJ SOSY
30.0000 mg | PREFILLED_SYRINGE | INTRAMUSCULAR | Status: DC
  Administered 2022-03-27 – 2022-03-28 (×2): 30 mg via SUBCUTANEOUS
  Filled 2022-03-27 (×2): qty 0.3

## 2022-03-27 MED ORDER — MIRTAZAPINE 15 MG PO TABS
30.0000 mg | ORAL_TABLET | Freq: Every day | ORAL | Status: DC
  Administered 2022-03-27 – 2022-03-29 (×3): 30 mg via ORAL
  Filled 2022-03-27 (×3): qty 2

## 2022-03-27 MED ORDER — ASPIRIN 325 MG PO TBEC
325.0000 mg | DELAYED_RELEASE_TABLET | Freq: Every day | ORAL | Status: DC
  Administered 2022-03-27 – 2022-03-30 (×4): 325 mg via ORAL
  Filled 2022-03-27 (×4): qty 1

## 2022-03-27 MED ORDER — PANTOPRAZOLE SODIUM 40 MG PO TBEC
40.0000 mg | DELAYED_RELEASE_TABLET | Freq: Every day | ORAL | Status: DC
  Administered 2022-03-27 – 2022-03-30 (×4): 40 mg via ORAL
  Filled 2022-03-27 (×4): qty 1

## 2022-03-27 MED ORDER — CARVEDILOL 12.5 MG PO TABS
12.5000 mg | ORAL_TABLET | Freq: Two times a day (BID) | ORAL | Status: DC
  Administered 2022-03-27 – 2022-03-30 (×6): 12.5 mg via ORAL
  Filled 2022-03-27 (×6): qty 1

## 2022-03-27 MED ORDER — THYROID 60 MG PO TABS
60.0000 mg | ORAL_TABLET | Freq: Every day | ORAL | Status: DC
  Administered 2022-03-28 – 2022-03-30 (×3): 60 mg via ORAL
  Filled 2022-03-27 (×3): qty 1

## 2022-03-27 MED ORDER — LORATADINE 10 MG PO TABS
10.0000 mg | ORAL_TABLET | Freq: Every day | ORAL | Status: DC
  Administered 2022-03-27 – 2022-03-30 (×4): 10 mg via ORAL
  Filled 2022-03-27 (×4): qty 1

## 2022-03-27 MED ORDER — HYDRALAZINE HCL 25 MG PO TABS
25.0000 mg | ORAL_TABLET | Freq: Three times a day (TID) | ORAL | Status: DC
  Administered 2022-03-27 – 2022-03-30 (×8): 25 mg via ORAL
  Filled 2022-03-27 (×9): qty 1

## 2022-03-27 MED ORDER — LAMOTRIGINE 25 MG PO TABS
150.0000 mg | ORAL_TABLET | Freq: Every day | ORAL | Status: DC
  Administered 2022-03-27 – 2022-03-29 (×3): 150 mg via ORAL
  Filled 2022-03-27 (×3): qty 2

## 2022-03-27 MED ORDER — PANCRELIPASE (LIP-PROT-AMYL) 12000-38000 UNITS PO CPEP
12000.0000 [IU] | ORAL_CAPSULE | Freq: Three times a day (TID) | ORAL | Status: DC
  Administered 2022-03-28: 12000 [IU] via ORAL
  Filled 2022-03-27 (×3): qty 1

## 2022-03-27 MED ORDER — FUROSEMIDE 10 MG/ML IJ SOLN
60.0000 mg | Freq: Two times a day (BID) | INTRAMUSCULAR | Status: DC
  Administered 2022-03-27 – 2022-03-28 (×3): 60 mg via INTRAVENOUS
  Filled 2022-03-27 (×3): qty 6

## 2022-03-27 MED ORDER — ACETAMINOPHEN 325 MG PO TABS: 650.0000 mg | ORAL_TABLET | Freq: Four times a day (QID) | ORAL | Status: DC | PRN

## 2022-03-27 MED ORDER — ONDANSETRON HCL 4 MG/2ML IJ SOLN: 4.0000 mg | Freq: Four times a day (QID) | INTRAMUSCULAR | Status: DC | PRN

## 2022-03-27 MED ORDER — INSULIN PUMP
Freq: Three times a day (TID) | SUBCUTANEOUS | Status: DC
  Filled 2022-03-27: qty 1

## 2022-03-27 MED ORDER — ONDANSETRON HCL 4 MG PO TABS: 4.0000 mg | ORAL_TABLET | Freq: Four times a day (QID) | ORAL | Status: DC | PRN

## 2022-03-27 MED ORDER — ACETAMINOPHEN 650 MG RE SUPP: 650.0000 mg | Freq: Four times a day (QID) | RECTAL | Status: DC | PRN

## 2022-03-27 MED ORDER — ISOSORBIDE MONONITRATE ER 30 MG PO TB24
30.0000 mg | ORAL_TABLET | Freq: Every day | ORAL | Status: DC
  Administered 2022-03-27 – 2022-03-30 (×4): 30 mg via ORAL
  Filled 2022-03-27 (×4): qty 1

## 2022-03-27 NOTE — Assessment & Plan Note (Signed)
Depression.  Continue with mirtazapine and lamotrigine.

## 2022-03-27 NOTE — Assessment & Plan Note (Signed)
Patient is on full dose aspirin at home. Continue blood pressure control.  Patient is not on statin therapy.

## 2022-03-27 NOTE — H&P (Signed)
History and Physical    Patient: Brittney Pace [id number] DOB: 28-Aug-1955 DOA: 03/27/2022 DOS: the patient was seen and examined on 03/27/2022 PCP: [name], MD  Patient coming from: Home  Chief Complaint:  Chief Complaint  Patient presents with   Leg Swelling   HPI: Brittney Pace is a 67 y.o. female with medical history significant of chronic kidney disease, T1DM, anemia of chronic disease, fibromyalgia, hypothyroidism and  heart failure who presented with lower extremity edema and dyspnea.  Patient was recently hospitalized from 03/18/22 to 0000000 for DKA complicated with AKI on CKD, and hyponatremia. Patient was treated with insulin and IV fluids. At the time of her discharge she was feeling better and she was instructed to resume her oral diuretic therapy. At home she developed progressive worsening lower extremity edema, worse in the evenings, and associated with cough. She had limited mobility due to heaviness in her legs and dyspnea on exertion. For the last 48 hrs her symptoms have been severe, initially she was reluctant to come to the hospital but today she agreed to come to the ED.   At home she did increase her diuretic therapy a few days with transitory improvement in her edema, but with no significant resolution of her symptoms.  No chest pain, no fever or chills.     Review of Systems: As mentioned in the history of present illness. All other systems reviewed and are negative. 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   Family history of adverse reaction to anesthesia    mother: " while she was under she stopped breathing."    Fibromyalgia    Gastroparesis    GERD (gastroesophageal reflux disease)    Headache    migraines   Hypothyroidism    IBS (irritable bowel syndrome)    Idiopathic edema    Iron deficiency anemia 09/04/2019   Lyme disease    Mitral valve prolapse    Myocardial infarction ([place])    1 major in 1999 and 2 minor " small vessel disease."   Osteoporosis    Peripheral neuropathy    Peripheral vascular disease (HCC)    Sinus disorder    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" " the second stroke was a lacunar    Past Surgical History:  Procedure Laterality Date   ABDOMINAL HYSTERECTOMY     APPENDECTOMY     BREAST SURGERY     B/L biopsy and lumpectomy    CARDIAC CATHETERIZATION     CARPAL TUNNEL RELEASE     CATARACT EXTRACTION W/ INTRAOCULAR LENS IMPLANT     right eye   COLONOSCOPY W/ BIOPSIES AND POLYPECTOMY     CORONARY ARTERY BYPASS GRAFT     coronary artery stents     at [facility] and [place]   ECTOPIC PREGNANCY SURGERY     NASAL SEPTUM SURGERY     OPEN REDUCTION INTERNAL FIXATION (ORIF) DISTAL RADIAL FRACTURE Right 05/06/2015   Procedure: OPEN REDUCTION INTERNAL FIXATION (ORIF) RIGHT DISTAL RADIAL FRACTURE AND REPAIRS  AS NEEDED;  Surgeon: Iran Planas, MD;  Location: [facility];  Service: Orthopedics;  Laterality: Right;   ORIF HUMERUS FRACTURE Right 04/30/2020   Procedure: OPEN REDUCTION INTERNAL FIXATION (ORIF) PROXIMAL HUMERUS FRACTURE;  Surgeon: Justice Britain, MD;  Location: [facility];  Service: Orthopedics;  Laterality: Right;  136mn   ORIF WRIST FRACTURE Left 11/05/2014   ORIF WRIST FRACTURE Left 11/05/2014   Procedure: OPEN REDUCTION INTERNAL FIXATION (ORIF) LEFT WRIST FRACTURE AND REPAIR AS INDICATED;  Surgeon: FIran Planas MD;  Location: [facility]  Service: Orthopedics;  Laterality: Left;   TRIGGER FINGER RELEASE     Social History:  reports that she quit smoking about 39 years ago. Her smoking use included cigarettes. She has a 10.00  pack-year smoking history. She has never used smokeless tobacco. She reports current alcohol use. She reports that she does not use drugs.  Allergies  Allergen Reactions   Morphine Anaphylaxis and Shortness Of Breath   Penicillins Hives    Tolerated ANCEF on 04/30/20 Has patient had a PCN reaction causing immediate rash, facial/tongue/throat swelling, SOB or lightheadedness with hypotension: Yes Has patient had a PCN reaction causing severe rash involving mucus membranes or skin necrosis: No Has patient had a PCN reaction that required hospitalization No Has patient had a PCN reaction occurring within the last 10 years: No If all of the above answers are "NO", then may proceed with Cephalosporin use.   Tramadol Anaphylaxis   Acetaminophen Other (See Comments)    Alters insulin pump readings    Amitriptyline Other (See Comments)    Severe headache/ out of body feeling   Codeine Other (See Comments)    Severe headaches/ out of body feeling    Ibuprofen Other (See Comments)    Messes up CGM reading on glucose monitor     Krill Oil Diarrhea, Itching and Nausea And Vomiting   Losartan Cough   Propoxyphene Other (See Comments)    Severe headaches / out of body feeling    Shellfish Allergy Diarrhea and Nausea And Vomiting   Statins Other (See Comments)    Muscle pains Other reaction(s): Other   Sulfamethoxazole Other (See Comments)    Mouth ulcers    Sulfites Itching and Other (See Comments)    Mouth ulcers   Fluconazole     Other reaction(s): Contact Dermatitis (intolerance)   Norvasc [Amlodipine Besylate] Swelling    Family History  Problem Relation Age of Onset   Breast cancer Mother    Migraines Mother    Heart disease Father    Hypertension Father    Breast cancer Sister     Prior to Admission medications   Medication Sig Start Date End Date Taking? Authorizing Provider  ACCU-CHEK GUIDE test strip 3 (three) times daily. 05/03/21   [provider]   AMBULATORY NON FORMULARY MEDICATION Incontinence pads per patient preference, #unlimited, refill x99 Fax to [phone]/1/20   AEmeterio Reeve DO  aspirin EC 325 MG tablet Take 1 tablet (325 mg total) by mouth daily. 03/06/19   CLelon Perla MD  carvedilol (COREG) 12.5 MG tablet Take 1 tablet (12.5 mg total) by mouth 2 (two) times daily with a meal. 05/07/21   Crenshaw, BDenice Bors MD  cetirizine (ZYRTEC) 10 MG tablet Take 10 mg by mouth daily. 03/26/21   [provider]  Continuous Blood Gluc Sensor (DEXCOM G6 SENSOR) MISC USE TO CONTINUOUSLY MONITOR BLOOD SUGARS. CHANGE EVERY 10 DAYS 04/02/19   [provider]  diphenoxylate-atropine (LOMOTIL) 2.5-0.025 MG  tablet TAKE 1 TABLET BY MOUTH 4 TIMES DAILY AS NEEDED FOR DIARRHEA OR  LOOSE  STOOLS Patient taking differently: Take 1 tablet by mouth 4 (four) times daily as needed for diarrhea or loose stools. 12/24/20   [name], MD  esomeprazole (NEXIUM) 20 MG capsule Take 20 mg by mouth every morning.     [provider]  hydrALAZINE (APRESOLINE) 25 MG tablet Take 1 tablet (25 mg total) by mouth 3 (three) times daily. 02/15/22 08/14/22  Lelon Perla, MD  Insulin Human (INSULIN PUMP) SOLN Inject into the skin continuous. Novolog insulin    [provider]  lamoTRIgine (LAMICTAL) 150 MG tablet Take 1 tablet by mouth daily. 01/25/22   [provider]  Melatonin Gummies 2.5 MG CHEW Chew by mouth at bedtime as needed.    [provider]  mirtazapine (REMERON) 30 MG tablet Take 30 mg by mouth at bedtime. 10/15/21   [provider]  NOVOLOG 100 UNIT/ML injection Inject 40 Units into the skin daily. 06/08/20   [provider]  Pancrelipase, Lip-Prot-Amyl, (CREON) 3000-9500 units CPEP Take 12,000 Units by mouth daily. 04/14/21   [provider]  PREMARIN vaginal cream every other day. 03/24/21   [provider]  thyroid (ARMOUR THYROID) 60 MG tablet Take 1 tablet by  mouth daily. 07/30/21   [provider]  torsemide ([name]) 20 MG tablet TAKE 1 TABLET ONCE DAILY INTHE MORNING. MAY TAKE AN   EXTRA 1/2 TABLET AS        NEEDED 01/15/18   Trixie Dredge, PA-C  valACYclovir (VALTREX) 500 MG tablet Take 500 mg by mouth daily as needed (Flair up).    [provider]    Physical Exam: Vitals:   03/27/22 1218 03/27/22 1219 03/27/22 1230  BP:  (!) 180/75 (!) 160/123  Pulse:  67 66  Resp:  15 16  Temp:  98.2 F (36.8 C)   TempSrc:  Oral   SpO2:  97% 99%  Weight: 47.6 kg    Height: '4\' 11"'$  (1.499 m)     Neurology awake and alert ENT with mild pallor Cardiovascular with S1 and S2 present and rhythmic, with systolic murmur at the right lower sternal border with no gallops, or rubs. Mild JVD Positive lower extremity edema pitting below the knees, ++ Respiratory with no rales or wheezing, no rhonchi Abdomen with no distention   Data Reviewed:   Na 130, K 3,8 CL 97, bicarbonate 20, glucose 222, bun 37 and cr 1,97  AST 23, ALT 34 BNP 583,5  High sensitive troponin 17  Wbc 10,3 hgb 9,9 plt 506  Urine analysis, SG 1,010, protein >300, negative leukocytes.   Chest radiograph with no cardiomegaly, hyperinflation, bilateral hilar vascular congestion, bilateral small pleural effusion. Sternotomy wires in place.   EKG 73 bpm, right axis deviation, normal intervals, sinus rhythm with no significant ST segment or T wave changes.   67 yo female with CKD stage IV, recently hospitalized for DKA and treated with VI fluids, who returns 6 days later with sings of volume overload, despite taking torsemide at home. On her initial physical examination her blood pressure is elevated with systolic in the [id number], she does have mild JVD and positive lower extremity edema, positive systolic murmur at right lower sternal border.  She has hyponatremia, and creatinine at baseline, with elevated BNP. Chest radiograph with signs of pulmonary edema, new  compared to last chest film.   Assessment and Plan: * Acute on  chronic diastolic CHF (congestive heart failure) (HCC) Echocardiogram with preserved LV systolic function with EF 55 to 60%, no LVH, RV with mild reduction in systolic function, RVSP 0000000 mmHg. No significant valvular disease.  Pulmonary hypertension Acute on chronic core pulmonale.   Plan to continue diuresis with furosemide IV 60 mg q12 to target negative fluid balance with close follow up on renal function.  Continue carvedilol and after load reduction with hydralazine, add isosorbide.  Follow up urine output and response to diuretics.   CKD (chronic kidney disease) stage 4, GFR 15-29 ml/min (HCC) Hyponatremia.   Renal function with serum cr at 1.97 Patient at home on torsemide 20 mg po daily. Will continue diuresis with furosemide 60 mg IV q12 hrs Follow up renal function and electrolytes, avoid hypotension and nephrotoxic medications.    HTN (hypertension) Continue blood pressure control with carvedilol and hydralazine, will add isosorbide.   CAD (coronary artery disease) Patient is on full dose aspirin at home. Continue blood pressure control.  Patient is not on statin therapy.   Diabetes mellitus type I ([place]) Patient will resume her insulin pump for glucose control per protocol.   Fibromyalgia Depression.  Continue with mirtazapine and lamotrigine.    Hypothyroidism Continue with levothyroxine.     Advance Care Planning:   Code Status: DNR   Consults: none   Family Communication: I spoke with patient's husband at the bedside, we talked in detail about patient's condition, plan of care and prognosis and all questions were addressed.   Severity of Illness: The appropriate patient status for this patient is INPATIENT. Inpatient status is judged to be reasonable and necessary in order to provide the required intensity of service to ensure the patient's safety. The patient's presenting symptoms, physical  exam findings, and initial radiographic and laboratory data in the context of their chronic comorbidities is felt to place them at high risk for further clinical deterioration. Furthermore, it is not anticipated that the patient will be medically stable for discharge from the hospital within 2 midnights of admission.   * I certify that at the point of admission it is my clinical judgment that the patient will require inpatient hospital care spanning beyond 2 midnights from the point of admission due to high intensity of service, high risk for further deterioration and high frequency of surveillance required.*  Author: Tawni Millers, MD 03/27/2022 3:27 PM  For on call review [email or web address].

## 2022-03-27 NOTE — Assessment & Plan Note (Addendum)
Echocardiogram with preserved LV systolic function with EF 55 to 60%, no LVH, RV with mild reduction in systolic function, RVSP 0000000 mmHg. No significant valvular disease.  Pulmonary hypertension Acute on chronic core pulmonale.   Plan to continue diuresis with furosemide IV 60 mg q12 to target negative fluid balance with close follow up on renal function.  Continue carvedilol and after load reduction with hydralazine, add isosorbide.  Follow up urine output and response to diuretics.

## 2022-03-27 NOTE — ED Notes (Signed)
ED TO INPATIENT HANDOFF REPORT  ED Nurse Name and Phone #: Clarise Cruz [phone]  S Name/Age/Gender Brittney Pace 67 y.o. female Room/Bed: WA08/WA08  Code Status   Code Status: DNR  Home/SNF/Other  Patient oriented to: self, place, time, and situation Is this baseline? Yes   Triage Complete: Triage complete  Chief Complaint Heart failure [facility]) [I50.9]  Triage Note Patient said both of her legs are "twice her size." She believes she needs dialysis. Has worsening kidney failure. Did not take her diuretic today.    Allergies Allergies  Allergen Reactions   Morphine Anaphylaxis and Shortness Of Breath   Penicillins Hives    Tolerated ANCEF on 04/30/20 Has patient had a PCN reaction causing immediate rash, facial/tongue/throat swelling, SOB or lightheadedness with hypotension: Yes Has patient had a PCN reaction causing severe rash involving mucus membranes or skin necrosis: No Has patient had a PCN reaction that required hospitalization No Has patient had a PCN reaction occurring within the last 10 years: No If all of the above answers are "NO", then may proceed with Cephalosporin use.   Tramadol Anaphylaxis   Acetaminophen Other (See Comments)    Alters insulin pump readings    Amitriptyline Other (See Comments)    Severe headache/ out of body feeling   Codeine Other (See Comments)    Severe headaches/ out of body feeling    Ibuprofen Other (See Comments)    Messes up CGM reading on glucose monitor     Krill Oil Diarrhea, Itching and Nausea And Vomiting   Losartan Cough   Propoxyphene Other (See Comments)    Severe headaches / out of body feeling    Shellfish Allergy Diarrhea and Nausea And Vomiting   Statins Other (See Comments)    Muscle pains Other reaction(s): Other   Sulfamethoxazole Other (See Comments)    Mouth ulcers    Sulfites Itching and Other (See Comments)    Mouth ulcers   Fluconazole     Other reaction(s): Contact Dermatitis (intolerance)    Norvasc [Amlodipine Besylate] Swelling    Level of Care/Admitting Diagnosis ED Disposition     ED Disposition  Admit   Condition  --   Comment  Hospital Area: [place] [[id number]]  Level of Care: Telemetry [5]  Admit to tele based on following criteria: Acute CHF  May admit patient to [facility] or [facility] if equivalent level of care is available:: No  Covid Evaluation: Asymptomatic - no recent exposure (last 10 days) testing not required  Diagnosis: Heart failure [facility]) [id number]  Admitting Physician: Tawni Millers [id number]  Attending Physician: Tawni Millers [id number]  Certification:: I certify this patient will need inpatient services for at least 2 midnights  Estimated Length of Stay: 3          B Medical/Surgery History Past Medical History:  Diagnosis Date   Anemia    Arthritis    Babesiasis    secondary due to lyme disease   CHF (congestive heart failure) ([place])    Chronic kidney disease    stage 3   Coronary artery disease    Depression    Diabetes mellitus without complication ([place])    Type 1   Diabetic retinopathy ([place])    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   Family history of adverse reaction to anesthesia    mother: " while she was under she stopped breathing."   Fibromyalgia    Gastroparesis    GERD (gastroesophageal reflux disease)    Headache  migraines   Hypothyroidism    IBS (irritable bowel syndrome)    Idiopathic edema    Iron deficiency anemia 09/04/2019   Lyme disease    Mitral valve prolapse    Myocardial infarction ([place])    1 major in 1999 and 2 minor " small vessel disease."   Osteoporosis    Peripheral neuropathy    Peripheral vascular disease (HCC)    Sinus disorder    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" " the second stroke was a lacunar    Past Surgical History:  Procedure Laterality Date   ABDOMINAL HYSTERECTOMY      APPENDECTOMY     BREAST SURGERY     B/L biopsy and lumpectomy    CARDIAC CATHETERIZATION     CARPAL TUNNEL RELEASE     CATARACT EXTRACTION W/ INTRAOCULAR LENS IMPLANT     right eye   COLONOSCOPY W/ BIOPSIES AND POLYPECTOMY     CORONARY ARTERY BYPASS GRAFT     coronary artery stents     at LAD and LIMA   ECTOPIC PREGNANCY SURGERY     NASAL SEPTUM SURGERY     OPEN REDUCTION INTERNAL FIXATION (ORIF) DISTAL RADIAL FRACTURE Right 05/06/2015   Procedure: OPEN REDUCTION INTERNAL FIXATION (ORIF) RIGHT DISTAL RADIAL FRACTURE AND REPAIRS AS NEEDED;  Surgeon: Iran Planas, MD;  Location: [facility];  Service: Orthopedics;  Laterality: Right;   ORIF HUMERUS FRACTURE Right 04/30/2020   Procedure: OPEN REDUCTION INTERNAL FIXATION (ORIF) PROXIMAL HUMERUS FRACTURE;  Surgeon: Justice Britain, MD;  Location: [facility];  Service: Orthopedics;  Laterality: Right;  134mn   ORIF WRIST FRACTURE Left 11/05/2014   ORIF WRIST FRACTURE Left 11/05/2014   Procedure: OPEN REDUCTION INTERNAL FIXATION (ORIF) LEFT WRIST FRACTURE AND REPAIR AS INDICATED;  Surgeon: FIran Planas MD;  [facility]  Service: Orthopedics;  Laterality: Left;   TRIGGER FINGER RELEASE       A IV Location/Drains/Wounds Patient Lines/Drains/Airways Status     Active Line/Drains/Airways     Name Placement date Placement time Site Days   Peripheral IV 03/27/22 20 G 1" Anterior;Left;Proximal Forearm 03/27/22  1329  Forearm  less than 1            Intake/Output Last 24 hours No intake or output data in the 24 hours ending 03/27/22 1553  Labs/Imaging Results for orders placed or performed during the hospital encounter of 03/27/22 (from the past 48 hour(s))  Comprehensive metabolic panel     Status: Abnormal   Collection Time: 03/27/22 12:40 PM  Result Value Ref Range   Sodium 130 (L) 135 - 145 mmol/L   Potassium 3.8 3.5 - 5.1 mmol/L   Chloride 97 (L) 98 - 111 mmol/L   CO2 20 (L) 22 - 32 mmol/L   Glucose, Bld 222 (H) 70 - 99 mg/dL     Comment: Glucose reference range applies only to samples taken after fasting for at least 8 hours.   BUN 37 (H) 8 - 23 mg/dL   Creatinine, Ser 1.97 (H) 0.44 - 1.00 mg/dL   Calcium 8.5 (L) 8.9 - 10.3 mg/dL   Total Protein 7.1 6.5 - 8.1 g/dL   Albumin 3.5 3.5 - 5.0 g/dL   AST 23 15 - 41 U/L   ALT 34 0 - 44 U/L   Alkaline Phosphatase 280 (H) 38 - 126 U/L   Total Bilirubin 0.9 0.3 - 1.2 mg/dL   GFR,  Estimated 27 (L) >60 mL/min    Comment: (NOTE) Calculated using the CKD-EPI Creatinine Equation (2021)    Anion gap 13 5 - 15    Comment: Performed at [facility], [place] [address], [place], [place] [address]  Troponin I (High Sensitivity)     Status: None   Collection Time: 03/27/22 12:40 PM  Result Value Ref Range   Troponin I (High Sensitivity) 17 <18 ng/L    Comment: (NOTE) Elevated high sensitivity troponin I (hsTnI) values and significant  changes across serial measurements may suggest ACS but many other  chronic and acute conditions are known to elevate hsTnI results.  Refer to the "Links" section for chest pain algorithms and additional  guidance. Performed at [facility], [place] [address], [place], [place] [address]   CBC with Differential     Status: Abnormal   Collection Time: 03/27/22 12:40 PM  Result Value Ref Range   WBC 10.3 4.0 - 10.5 K/uL   RBC 3.58 (L) 3.87 - 5.11 MIL/uL   Hemoglobin 9.9 (L) 12.0 - 15.0 g/dL   HCT 30.7 (L) 36.0 - 46.0 %   MCV 85.8 80.0 - 100.0 fL   MCH 27.7 26.0 - 34.0 pg   MCHC 32.2 30.0 - 36.0 g/dL   RDW 14.9 11.5 - 15.5 %   Platelets 506 (H) 150 - 400 K/uL   nRBC 0.0 0.0 - 0.2 %   Neutrophils Relative % 67 %   Neutro Abs 7.0 1.7 - 7.7 K/uL   Lymphocytes Relative 10 %   Lymphs Abs 1.0 0.7 - 4.0 K/uL   Monocytes Relative 8 %   Monocytes Absolute 0.8 0.1 - 1.0 K/uL   Eosinophils Relative 13 %   Eosinophils Absolute 1.3 (H) 0.0 - 0.5 K/uL   Basophils Relative 1 %   Basophils Absolute 0.1 0.0 - 0.1 K/uL    Immature Granulocytes 1 %   Abs Immature Granulocytes 0.07 0.00 - 0.07 K/uL    Comment: Performed at [facility], [place] [address] [place], [place] [address]  Urinalysis, Routine w reflex microscopic -Urine, Clean Catch     Status: Abnormal   Collection Time: 03/27/22 12:40 PM  Result Value Ref Range   Color, Urine YELLOW YELLOW   APPearance CLEAR CLEAR   Specific Gravity, Urine 1.010 1.005 - 1.030   pH 6.0 5.0 - 8.0   Glucose, UA 50 (A) NEGATIVE mg/dL   Hgb urine dipstick NEGATIVE NEGATIVE   Bilirubin Urine NEGATIVE NEGATIVE   Ketones, ur 5 (A) NEGATIVE mg/dL   Protein, ur >=300 (A) NEGATIVE mg/dL   Nitrite NEGATIVE NEGATIVE   Leukocytes,Ua NEGATIVE NEGATIVE   RBC / HPF 0-5 0 - 5 RBC/hpf   WBC, UA 0-5 0 - 5 WBC/hpf   Bacteria, UA NONE SEEN NONE SEEN   Squamous Epithelial / HPF 0-5 0 - 5 /HPF    Comment: Performed at [facility], [place] [address], [place], [place] [address]  Brain natriuretic peptide     Status: Abnormal   Collection Time: 03/27/22  2:00 PM  Result Value Ref Range   B Natriuretic Peptide 583.5 (H) 0.0 - 100.0 pg/mL    Comment: Performed at [facility], [place] [address] [place],  [address]  POC CBG, ED     Status: Abnormal   Collection Time: 03/27/22  3:15 PM  Result Value Ref Range   Glucose-Capillary 257 (H) 70 - 99 mg/dL    Comment: Glucose  reference range applies only to samples taken after fasting for at least 8 hours.   DG Chest 2 View  Result Date: 03/27/2022 CLINICAL DATA:  That's shortness of breath. EXAM: CHEST - 2 VIEW COMPARISON:  03/18/2022 FINDINGS: The cardio pericardial silhouette is enlarged. Lungs are hyperexpanded. Bibasilar atelectasis or infiltrate is associated with small bilateral pleural effusions. Interstitial markings are diffusely coarsened with chronic features. Bones are diffusely demineralized. Fixation hardware noted proximal right humerus. Telemetry leads overlie the chest.  IMPRESSION: Bibasilar atelectasis or infiltrate with small bilateral pleural effusions. Emphysema. Electronically Signed   By: Misty Stanley M.D.   On: 03/27/2022 13:28    Pending Labs [employer] (From admission, onward)     Start     Ordered   Signed and Held  CBC  (enoxaparin (LOVENOX)    CrCl >/= 30 ml/min)  Once,   R       Comments: Baseline for enoxaparin therapy IF NOT ALREADY DRAWN.  Notify MD if PLT < 100 K.    Signed and Held   Signed and Held  Creatinine, serum  (enoxaparin (LOVENOX)    CrCl >/= 30 ml/min)  Once,   R       Comments: Baseline for enoxaparin therapy IF NOT ALREADY DRAWN.    Signed and Held   Signed and Held  Creatinine, serum  (enoxaparin (LOVENOX)    CrCl >/= 30 ml/min)  Weekly,   R     Comments: while on enoxaparin therapy    Signed and Held   Signed and Held  Basic metabolic panel  Tomorrow morning,   R        Signed and Held   Signed and Held  Comprehensive metabolic panel  Tomorrow morning,   R        Signed and Held            Vitals/Pain Today's Vitals   03/27/22 1217 03/27/22 1218 03/27/22 1219 03/27/22 1230  BP:   (!) 180/75 (!) 160/123  Pulse:   67 66  Resp:   15 16  Temp:   98.2 F (36.8 C)   TempSrc:   Oral   SpO2:   97% 99%  Weight:  47.6 kg    Height:  '4\' 11"'$  (1.499 m)    PainSc: 0-No pain       Isolation Precautions No active isolations  Medications Medications - No data to display  Mobility walks with device     Focused Assessments    R Recommendations: See Admitting Provider Note  Report given to:   Additional Notes:

## 2022-03-27 NOTE — ED Provider Notes (Addendum)
[facility] Provider Note   CSN: [id number] Arrival date & time: 03/27/22  1211     History  Chief Complaint  Patient presents with   Leg Swelling    Brittney Pace is a 67 y.o. female history of chronic kidney disease, diabetes, stroke presented with bilateral thigh swelling which has been present since she was discharged on 03/18/2022.  Patient states she takes Lasix however did not take her dose today.  Patient believes she needs to be put on dialysis as her kidney function is worsening.  Patient also stated she has shortness of breath but denied any cough.  Patient believes she is also retaining urine and last urinated at 0700 today.  Patient denied chest pain, hemoptysis, recent travel, clotting disorder, abdominal pain, nausea/vomiting, vision changes  Home Medications Prior to Admission medications   Medication Sig Start Date End Date Taking? Authorizing Provider  ACCU-CHEK GUIDE test strip 3 (three) times daily. 05/03/21   [provider]  AMBULATORY NON FORMULARY MEDICATION Incontinence pads per patient preference, #unlimited, refill [phone] Fax to [phone] 12/25/18   Emeterio Reeve, DO  aspirin EC 325 MG tablet Take 1 tablet (325 mg total) by mouth daily. 03/06/19   Lelon Perla, MD  carvedilol (COREG) 12.5 MG tablet Take 1 tablet (12.5 mg total) by mouth 2 (two) times daily with a meal. 05/07/21   Crenshaw, Denice Bors, MD  cetirizine (ZYRTEC) 10 MG tablet Take 10 mg by mouth daily. 03/26/21   [provider]  Continuous Blood Gluc Sensor (DEXCOM G6 SENSOR) MISC USE TO CONTINUOUSLY MONITOR BLOOD SUGARS. CHANGE EVERY 10 DAYS 04/02/19   [provider]  diphenoxylate-atropine (LOMOTIL) 2.5-0.025 MG tablet TAKE 1 TABLET BY MOUTH 4 TIMES DAILY AS NEEDED FOR DIARRHEA OR  LOOSE  STOOLS Patient taking differently: Take 1 tablet by mouth 4 (four) times daily as needed for diarrhea or loose stools. 12/24/20    [name], MD  esomeprazole (NEXIUM) 20 MG capsule Take 20 mg by mouth every morning.     [provider]  hydrALAZINE (APRESOLINE) 25 MG tablet Take 1 tablet (25 mg total) by mouth 3 (three) times daily. 02/15/22 08/14/22  Lelon Perla, MD  Insulin Human (INSULIN PUMP) SOLN Inject into the skin continuous. Novolog insulin    [provider]  lamoTRIgine (LAMICTAL) 150 MG tablet Take 1 tablet by mouth daily. 01/25/22   [provider]  Melatonin Gummies 2.5 MG CHEW Chew by mouth at bedtime as needed.    [provider]  mirtazapine (REMERON) 30 MG tablet Take 30 mg by mouth at bedtime. 10/15/21   [provider]  NOVOLOG 100 UNIT/ML injection Inject 40 Units into the skin daily. 06/08/20   [provider]  Pancrelipase, Lip-Prot-Amyl, (CREON) 3000-9500 units CPEP Take 12,000 Units by mouth daily. 04/14/21   [provider]  PREMARIN vaginal cream every other day. 03/24/21   [provider]  thyroid (ARMOUR THYROID) 60 MG tablet Take 1 tablet by mouth daily. 07/30/21   [provider]  torsemide ([name]) 20 MG tablet TAKE 1 TABLET ONCE DAILY INTHE MORNING. MAY TAKE AN   EXTRA 1/2 TABLET AS        NEEDED 01/15/18   Trixie Dredge, PA-C  valACYclovir (VALTREX) 500 MG tablet Take 500 mg by mouth daily as needed (Flair up).    [provider]      Allergies    Morphine, Penicillins, Tramadol, Acetaminophen, Amitriptyline,  Codeine, Ibuprofen, Krill oil, Losartan, Propoxyphene, Shellfish allergy, Statins, Sulfamethoxazole, Sulfites, Fluconazole, and Norvasc [amlodipine besylate]    Review of Systems   Review of Systems HPI Physical Exam Updated Vital Signs BP (!) 160/123   Pulse 66   Temp 98.2 F (36.8 C) (Oral)   Resp 16   Ht '4\' 11"'$  (1.499 m)   Wt 47.6 kg   SpO2 99%   BMI 21.21 kg/m  Physical Exam Vitals and nursing note reviewed.  Constitutional:      General: She is not in acute  distress.    Appearance: She is well-developed.  HENT:     Head: Normocephalic and atraumatic.  Eyes:     Extraocular Movements: Extraocular movements intact.     Conjunctiva/sclera: Conjunctivae normal.     Pupils: Pupils are equal, round, and reactive to light.  Cardiovascular:     Rate and Rhythm: Normal rate and regular rhythm.     Pulses: Normal pulses.     Heart sounds: Normal heart sounds. No murmur heard.    Comments: 2+ bilateral radial/dorsalis pedis pulses with regular rate Pulmonary:     Effort: Pulmonary effort is normal. No respiratory distress.     Breath sounds: Normal breath sounds.  Abdominal:     Palpations: Abdomen is soft.     Tenderness: There is no abdominal tenderness. There is no guarding or rebound.  Musculoskeletal:        General: No swelling. Normal range of motion.     Cervical back: Neck supple.     Right lower leg: No edema.     Left lower leg: No edema.     Comments: Bilateral thighs did not appear to be edematous  Skin:    General: Skin is warm and dry.     Capillary Refill: Capillary refill takes less than 2 seconds.  Neurological:     General: No focal deficit present.     Mental Status: She is alert and oriented to person, place, and time.     Comments: Sensation intact in all 4 limbs  Psychiatric:        Mood and Affect: Mood normal.     ED Results / Procedures / Treatments   Labs (all labs ordered are listed, but only abnormal results are displayed) Labs Reviewed  COMPREHENSIVE METABOLIC PANEL - Abnormal; Notable for the following components:      Result Value   Sodium 130 (*)    Chloride 97 (*)    CO2 20 (*)    Glucose, Bld 222 (*)    BUN 37 (*)    Creatinine, Ser 1.97 (*)    Calcium 8.5 (*)    Alkaline Phosphatase 280 (*)    GFR, Estimated 27 (*)    All other components within normal limits  CBC WITH DIFFERENTIAL/PLATELET - Abnormal; Notable for the following components:   RBC 3.58 (*)    Hemoglobin 9.9 (*)    HCT 30.7  (*)    Platelets 506 (*)    Eosinophils Absolute 1.3 (*)    All other components within normal limits  URINALYSIS, ROUTINE W REFLEX MICROSCOPIC - Abnormal; Notable for the following components:   Glucose, UA 50 (*)    Ketones, ur 5 (*)    Protein, ur >=300 (*)    All other components within normal limits  BRAIN NATRIURETIC PEPTIDE - Abnormal; Notable for the following components:   B Natriuretic Peptide 583.5 (*)    All other components within normal limits  CBG  MONITORING, ED - Abnormal; Notable for the following components:   Glucose-Capillary 257 (*)    All other components within normal limits  TROPONIN I (HIGH SENSITIVITY)  TROPONIN I (HIGH SENSITIVITY)    EKG None  Radiology DG Chest 2 View  Result Date: 03/27/2022 CLINICAL DATA:  That's shortness of breath. EXAM: CHEST - 2 VIEW COMPARISON:  03/18/2022 FINDINGS: The cardio pericardial silhouette is enlarged. Lungs are hyperexpanded. Bibasilar atelectasis or infiltrate is associated with small bilateral pleural effusions. Interstitial markings are diffusely coarsened with chronic features. Bones are diffusely demineralized. Fixation hardware noted proximal right humerus. Telemetry leads overlie the chest. IMPRESSION: Bibasilar atelectasis or infiltrate with small bilateral pleural effusions. Emphysema. Electronically Signed   By: Misty Stanley M.D.   On: 03/27/2022 13:28    Procedures .Critical Care  Performed by: Chuck Hint, PA-C Authorized by: Chuck Hint, PA-C   Critical care provider statement:    Critical care time (minutes):  40   Critical care time was exclusive of:  Separately billable procedures and treating other patients   Critical care was necessary to treat or prevent imminent or life-threatening deterioration of the following conditions:  Cardiac failure   Critical care was time spent personally by me on the following activities:  Development of treatment plan with patient or surrogate, discussions  with consultants, obtaining history from patient or surrogate, examination of patient, evaluation of patient's response to treatment, review of old charts, re-evaluation of patient's condition, pulse oximetry, ordering and review of radiographic studies, ordering and review of laboratory studies and ordering and performing treatments and interventions   I assumed direction of critical care for this patient from another provider in my specialty: no     Care discussed with: admitting provider       Medications Ordered in ED Medications - No data to display  ED Course/ Medical Decision Making/ A&P                             Medical Decision Making Amount and/or Complexity of Data Reviewed Labs: ordered. Radiology: ordered.  Risk Decision regarding hospitalization.   [name] 67 y.o. presented today for thigh edema and shortness of breath. Working DDx that I considered at this time includes, but not limited to, pleural effusions,, CHF exacerbation, AKI, CKD, liver disease, PE.  Review of prior external notes: 03/21/2022 discharge summary  Unique Tests and My Interpretation:  Chest x-ray: Basilar atelectasis with bilateral pleural effusions CBC with differential: Thrombocytosis 506 UA: Proteinuria CMP: GFR 27, creatinine 1.97 Troponin: 17, pending BNP: 583.5 Point-of-care CBG: 257  Discussion with Independent Historian: Husband  Discussion of Management of Tests: Arrien, MD hospitalist  Risk:  High:  - hospitalization or escalation of hospital-level care  Risk Stratification Score: None  R/o DDx: PE/DVT: No lower leg edema noted on exam Liver process: No history and no transaminitis  Plan: Patient presented for shortness of breath and bilateral thigh edema.  On exam patient had elevated blood pressure of 160/123 but does not appear to be in distress.  On exam I did not note any lower extremity edema the patient did states she feels short of breath and so chest  x-ray ordered along with labs.  Patient stable at this time.  Patient's labs came back with an elevated BNP from when she was discharged with pleural effusions noted on x-ray.  Patient still has a decreased kidney function with GFR of 27 and has  been taking her Lasix daily.  I believe patient would benefit from inpatient stay with careful diuresis with her decreased kidney function and elevated BNP as this is most likely CHF exacerbation.  Patient's glucose was also elevated but patient is unable to receive fluids as she is very hypertensive and retaining fluid due to her CHF.    I spoke with the hospitalist and the hospitalist accepted the patient.  Patient stable at this time.         Final Clinical Impression(s) / ED Diagnoses Final diagnoses:  Acute on chronic congestive heart failure, unspecified heart failure type ([place])  Pleural effusion    Rx / DC Orders ED Discharge Orders     None         [facility] 03/27/22 1539    Chuck Hint, PA-C 03/27/22 1546    Pattricia Boss, MD 03/27/22 1550

## 2022-03-27 NOTE — Progress Notes (Signed)
Insulin pump therapy contract signed by patient and witnessed by this nurse.  Contract placed in patient chart.

## 2022-03-27 NOTE — Assessment & Plan Note (Signed)
Hyponatremia.   Renal function with serum cr at 1.97 Patient at home on torsemide 20 mg po daily. Will continue diuresis with furosemide 60 mg IV q12 hrs Follow up renal function and electrolytes, avoid hypotension and nephrotoxic medications.

## 2022-03-27 NOTE — ED Triage Notes (Signed)
Patient said both of her legs are "twice her size." She believes she needs dialysis. Has worsening kidney failure. Did not take her diuretic today.

## 2022-03-27 NOTE — Assessment & Plan Note (Signed)
Continue with levothyroxine  

## 2022-03-27 NOTE — Assessment & Plan Note (Signed)
Patient will resume her insulin pump for glucose control per protocol.

## 2022-03-27 NOTE — Assessment & Plan Note (Signed)
Continue blood pressure control with carvedilol and hydralazine, will add isosorbide.

## 2022-03-28 LAB — CBC WITH DIFFERENTIAL/PLATELET
Abs Immature Granulocytes: 0.06 10*3/uL (ref 0.00–0.07)
Basophils Absolute: 0.1 10*3/uL (ref 0.0–0.1)
Basophils Relative: 1 %
Eosinophils Absolute: 0.3 10*3/uL (ref 0.0–0.5)
Eosinophils Relative: 3 %
HCT: 23.5 % — ABNORMAL LOW (ref 36.0–46.0)
Hemoglobin: 7.9 g/dL — ABNORMAL LOW (ref 12.0–15.0)
Immature Granulocytes: 1 %
Lymphocytes Relative: 7 %
Lymphs Abs: 0.8 10*3/uL (ref 0.7–4.0)
MCH: 28.7 pg (ref 26.0–34.0)
MCHC: 33.6 g/dL (ref 30.0–36.0)
MCV: 85.5 fL (ref 80.0–100.0)
Monocytes Absolute: 1.4 10*3/uL — ABNORMAL HIGH (ref 0.1–1.0)
Monocytes Relative: 13 %
Neutro Abs: 8.4 10*3/uL — ABNORMAL HIGH (ref 1.7–7.7)
Neutrophils Relative %: 75 %
Platelets: 439 10*3/uL — ABNORMAL HIGH (ref 150–400)
RBC: 2.75 MIL/uL — ABNORMAL LOW (ref 3.87–5.11)
RDW: 14.9 % (ref 11.5–15.5)
WBC: 11.2 10*3/uL — ABNORMAL HIGH (ref 4.0–10.5)
nRBC: 0 % (ref 0.0–0.2)

## 2022-03-28 LAB — GLUCOSE, CAPILLARY
Glucose-Capillary: 122 mg/dL — ABNORMAL HIGH (ref 70–99)
Glucose-Capillary: 121 mg/dL — ABNORMAL HIGH (ref 70–99)
Glucose-Capillary: 158 mg/dL — ABNORMAL HIGH (ref 70–99)
Glucose-Capillary: 212 mg/dL — ABNORMAL HIGH (ref 70–99)
Glucose-Capillary: 156 mg/dL — ABNORMAL HIGH (ref 70–99)
Glucose-Capillary: 203 mg/dL — ABNORMAL HIGH (ref 70–99)

## 2022-03-28 LAB — COMPREHENSIVE METABOLIC PANEL
ALT: 25 U/L (ref 0–44)
AST: 15 U/L (ref 15–41)
Albumin: 2.6 g/dL — ABNORMAL LOW (ref 3.5–5.0)
Alkaline Phosphatase: 193 U/L — ABNORMAL HIGH (ref 38–126)
Anion gap: 10 (ref 5–15)
BUN: 39 mg/dL — ABNORMAL HIGH (ref 8–23)
CO2: 22 mmol/L (ref 22–32)
Calcium: 8.4 mg/dL — ABNORMAL LOW (ref 8.9–10.3)
Chloride: 104 mmol/L (ref 98–111)
Creatinine, Ser: 2.32 mg/dL — ABNORMAL HIGH (ref 0.44–1.00)
GFR, Estimated: 22 mL/min — ABNORMAL LOW (ref >60)
Glucose, Bld: 166 mg/dL — ABNORMAL HIGH (ref 70–99)
Potassium: 3.7 mmol/L (ref 3.5–5.1)
Sodium: 136 mmol/L (ref 135–145)
Total Bilirubin: 0.6 mg/dL (ref 0.3–1.2)
Total Protein: 5.5 g/dL — ABNORMAL LOW (ref 6.5–8.1)

## 2022-03-28 LAB — HEMOGLOBIN AND HEMATOCRIT, BLOOD
HCT: 25.2 % — ABNORMAL LOW (ref 36.0–46.0)
Hemoglobin: 8 g/dL — ABNORMAL LOW (ref 12.0–15.0)

## 2022-03-28 MED ORDER — MELATONIN 5 MG PO TABS
5.0000 mg | ORAL_TABLET | Freq: Every evening | ORAL | Status: AC | PRN
  Administered 2022-03-28 – 2022-03-29 (×2): 5 mg via ORAL
  Filled 2022-03-28 (×2): qty 1

## 2022-03-28 NOTE — Progress Notes (Signed)
PROGRESS NOTE    Brittney Pace  [id number] DOB: [birth year] DOA: 03/27/2022 PCP: [name], MD   Brief Narrative: This 67 years old female with PMH significant for chronic kidney disease stage IV, type 1 diabetes mellitus, anemia of chronic disease, fibromyalgia, hypothyroidism and diastolic CHF presented in the ED with worsening lower extremity swelling and shortness of breath.  Patient was recently hospitalized from 03/18/2022 through [id number] for DKA complicated with AKI on CKD and hyponatremia.  Patient was treated with insulin, IV fluids,  at the time of discharge she was feeling much better and was instructed to resume her oral diuretic therapy.  At home she has developed progressive worsening lower extremity edema associated with cough. She also reports limited mobility due to heaviness in the legs and dyspnea on exertion.  Her symptoms been worse for last 48 hours.  Patient has increased her diuretic therapy at home with partial improvement in her edema.  Patient is admitted for further evaluation  Assessment & Plan:   Principal Problem:   Acute on chronic diastolic CHF (congestive heart failure) (HCC) Active Problems:   CKD (chronic kidney disease) stage 4, GFR 15-29 ml/min (HCC)   HTN (hypertension)   CAD (coronary artery disease)   Diabetes mellitus type I (HCC)   Fibromyalgia   Hypothyroidism  Acute on chronic diastolic CHF: Patient presented with worsening shortness of breath and bilateral leg edema.  BNP 583 Recent echo showed LVEF 55 to 60%, No LVH, No RWMA, No valvular disease. Continue IV diuresis with furosemide 60 mg every 12 hours. Daily weight, Monitor intake / output charting. Closely monitor renal functions. Continue carvedilol, and afterload reduction with hydralazine, Start Imdur 30 mg daily.  AKI on CKD stage IV: Likely secondary to diuretic use at home. Baseline serum creatinine 1.97. Continue diuresis with Lasix 60 mg IV every 12  hours Follow-up renal functions and electrolytes.   Avoid hypotension and nephrotoxic medications  Essential hypertension: Continue carvedilol,  hydralazine and Imdur.  Coronary artery disease: Patient denies any chest pain.  Continue aspirin. Patient is not on a statin therapy  DM I: Resume insulin pump for glucose control.  Fibromyalgia: Continue home pain medications. Continue lamotrigine  Depression: Continue Remeron.  Hypothyroidism: Continue levothyroxine   DVT prophylaxis: Heparin sq Code Status: DNR Family Communication: No family at bed side. Disposition Plan:    Status is: Inpatient Remains inpatient appropriate because:  Admitted for acute on chronic diastolic CHF requiring IV diuresis.  Consultants:  None  Procedures: None  Antimicrobials: None  Subjective: Patient was seen and examined at bedside.  Overnight events noted. Patient reports doing better , still reports having bilateral leg swelling.  Objective: Vitals:   03/27/22 1648 03/27/22 2053 03/28/22 0100 03/28/22 0413  BP: (!) 152/63 139/61 (!) 140/55 (!) 138/56  Pulse: 70 67 66 70  Resp: 18 (!) '21 20 20  '$ Temp: 98.6 F (37 C) 98.6 F (37 C) 98.7 F (37.1 C) 98.7 F (37.1 C)  TempSrc: Oral Oral  Oral  SpO2: 100% 96% 96% 95%  Weight:      Height:        Intake/Output Summary (Last 24 hours) at 03/28/2022 1120 Last data filed at 03/28/2022 [id number] Gross per 24 hour  Intake 480 ml  Output 750 ml  Net -270 ml   Filed Weights   03/27/22 1218  Weight: 47.6 kg    Examination:  General exam: Appears comfortable, not in any acute distress, deconditioned. Respiratory system: Clear to auscultation. Respiratory  effort normal.  RR 16. Cardiovascular system: S1 & S2 heard, regular rate and rhythm, murmur ++. Gastrointestinal system: Abdomen is soft, non tender, non distended, BS+ Central nervous system: Alert and oriented x 3. No focal neurological deficits. Extremities:  Edema+, No  cyanosis, no clubbing Skin: No rashes, lesions or ulcers Psychiatry: Judgement and insight appear normal. Mood & affect appropriate.     Data Reviewed: I have personally reviewed following labs and imaging studies  CBC: Recent Labs  Lab 03/27/22 1240 03/28/22 0645 03/28/22 0902  WBC 10.3 11.2*  --   NEUTROABS 7.0 8.4*  --   HGB 9.9* 7.9* 8.0*  HCT 30.7* 23.5* 25.2*  MCV 85.8 85.5  --   PLT 506* 439*  --    Basic Metabolic Panel: Recent Labs  Lab 03/27/22 1240 03/28/22 0645  NA 130* 136  K 3.8 3.7  CL 97* 104  CO2 20* 22  GLUCOSE 222* 166*  BUN 37* 39*  CREATININE 1.97* 2.32*  CALCIUM 8.5* 8.4*   GFR: Estimated Creatinine Clearance: 16 mL/min (A) (by C-G formula based on SCr of 2.32 mg/dL (H)). Liver Function Tests: Recent Labs  Lab 03/27/22 1240 03/28/22 0645  AST 23 15  ALT 34 25  ALKPHOS 280* 193*  BILITOT 0.9 0.6  PROT 7.1 5.5*  ALBUMIN 3.5 2.6*   No results for input(s): "LIPASE", "AMYLASE" in the last 168 hours. No results for input(s): "AMMONIA" in the last 168 hours. Coagulation Profile: No results for input(s): "INR", "PROTIME" in the last 168 hours. Cardiac Enzymes: No results for input(s): "CKTOTAL", "CKMB", "CKMBINDEX", "TROPONINI" in the last 168 hours. BNP (last 3 results) No results for input(s): "PROBNP" in the last 8760 hours. HbA1C: No results for input(s): "HGBA1C" in the last 72 hours. CBG: Recent Labs  Lab 03/27/22 1515 03/27/22 2131 03/28/22 0305 03/28/22 0411 03/28/22 0721  GLUCAP 257* 149* 122* 121* 158*   Lipid Profile: No results for input(s): "CHOL", "HDL", "LDLCALC", "TRIG", "CHOLHDL", "LDLDIRECT" in the last 72 hours. Thyroid Function Tests: No results for input(s): "TSH", "T4TOTAL", "FREET4", "T3FREE", "THYROIDAB" in the last 72 hours. Anemia Panel: No results for input(s): "VITAMINB12", "FOLATE", "FERRITIN", "TIBC", "IRON", "RETICCTPCT" in the last 72 hours. Sepsis Labs: No results for input(s): "PROCALCITON",  "LATICACIDVEN" in the last 168 hours.  Recent Results (from the past 240 hour(s))  MRSA Next Gen by PCR, Nasal     Status: None   Collection Time: 03/19/22 12:40 PM   Specimen: Nasal Mucosa; Nasal Swab  Result Value Ref Range Status   MRSA by PCR Next Gen NOT DETECTED NOT DETECTED Final    Comment: (NOTE) The GeneXpert MRSA Assay (FDA approved for NASAL specimens only), is one component of a comprehensive MRSA colonization surveillance program. It is not intended to diagnose MRSA infection nor to guide or monitor treatment for MRSA infections. Test performance is not FDA approved in patients less than 58 years old. Performed at [facility], [place] [address] [place], [place] [address]     Radiology Studies: DG Chest 2 View  Result Date: 03/27/2022 CLINICAL DATA:  That's shortness of breath. EXAM: CHEST - 2 VIEW COMPARISON:  03/18/2022 FINDINGS: The cardio pericardial silhouette is enlarged. Lungs are hyperexpanded. Bibasilar atelectasis or infiltrate is associated with small bilateral pleural effusions. Interstitial markings are diffusely coarsened with chronic features. Bones are diffusely demineralized. Fixation hardware noted proximal right humerus. Telemetry leads overlie the chest. IMPRESSION: Bibasilar atelectasis or infiltrate with small bilateral pleural effusions. Emphysema. Electronically Signed  By: Misty Stanley M.D.   On: 03/27/2022 13:28    Scheduled Meds:  aspirin EC  325 mg Oral Daily   carvedilol  12.5 mg Oral BID WC   enoxaparin (LOVENOX) injection  30 mg Subcutaneous Q24H   furosemide  60 mg Intravenous Q12H   hydrALAZINE  25 mg Oral TID   insulin pump   Subcutaneous TID WC, HS, 0200   isosorbide mononitrate  30 mg Oral Daily   lamoTRIgine  150 mg Oral Daily   lipase/protease/amylase  12,000 Units Oral TID WC   loratadine  10 mg Oral Daily   mirtazapine  30 mg Oral QHS   pantoprazole  40 mg Oral Daily   thyroid  60 mg Oral Daily   Continuous  Infusions:   LOS: 1 day    Time spent: 50 mins    Livy Ross, MD [facility]   If 7PM-7AM, please contact night-coverage

## 2022-03-28 NOTE — Progress Notes (Signed)
Mobility Specialist Cancellation/Refusal Note:   Reason for Cancellation/Refusal: Pt declined mobility at this time. Stated feeling too tired from her diarrhea. Will check back as schedule permits.    Ferd Hibbs Mobility Specialist

## 2022-03-29 LAB — PHOSPHORUS: Phosphorus: 5.2 mg/dL — ABNORMAL HIGH (ref 2.5–4.6)

## 2022-03-29 LAB — BASIC METABOLIC PANEL
Anion gap: 8 (ref 5–15)
BUN: 42 mg/dL — ABNORMAL HIGH (ref 8–23)
CO2: 25 mmol/L (ref 22–32)
Calcium: 8.5 mg/dL — ABNORMAL LOW (ref 8.9–10.3)
Chloride: 103 mmol/L (ref 98–111)
Creatinine, Ser: 2.54 mg/dL — ABNORMAL HIGH (ref 0.44–1.00)
GFR, Estimated: 20 mL/min — ABNORMAL LOW (ref >60)
Glucose, Bld: 150 mg/dL — ABNORMAL HIGH (ref 70–99)
Potassium: 3.4 mmol/L — ABNORMAL LOW (ref 3.5–5.1)
Sodium: 136 mmol/L (ref 135–145)

## 2022-03-29 LAB — GLUCOSE, CAPILLARY
Glucose-Capillary: 187 mg/dL — ABNORMAL HIGH (ref 70–99)
Glucose-Capillary: 156 mg/dL — ABNORMAL HIGH (ref 70–99)
Glucose-Capillary: 306 mg/dL — ABNORMAL HIGH (ref 70–99)
Glucose-Capillary: 215 mg/dL — ABNORMAL HIGH (ref 70–99)
Glucose-Capillary: 187 mg/dL — ABNORMAL HIGH (ref 70–99)

## 2022-03-29 LAB — MAGNESIUM: Magnesium: 1.9 mg/dL (ref 1.7–2.4)

## 2022-03-29 MED ORDER — FUROSEMIDE 10 MG/ML IJ SOLN: 40.0000 mg | Freq: Every day | INTRAMUSCULAR | Status: DC

## 2022-03-29 MED ORDER — FUROSEMIDE 40 MG PO TABS
40.0000 mg | ORAL_TABLET | Freq: Every day | ORAL | Status: DC
  Administered 2022-03-29: 40 mg via ORAL
  Filled 2022-03-29: qty 1

## 2022-03-29 MED ORDER — POTASSIUM CHLORIDE 20 MEQ PO PACK
40.0000 meq | PACK | Freq: Once | ORAL | Status: AC
  Administered 2022-03-29: 40 meq via ORAL
  Filled 2022-03-29: qty 2

## 2022-03-29 NOTE — Plan of Care (Signed)
  Problem: Cardiac: Goal: Ability to maintain an adequate cardiac output will improve Outcome: Progressing   Problem: Health Behavior/Discharge Planning: Goal: Ability to identify and utilize available resources and services will improve Outcome: Progressing Goal: Ability to manage health-related needs will improve Outcome: Progressing   Problem: Metabolic: Goal: Ability to maintain appropriate glucose levels will improve Outcome: Progressing   Problem: Nutritional: Goal: Maintenance of adequate nutrition will improve Outcome: Progressing

## 2022-03-29 NOTE — Progress Notes (Signed)
.   Transition of Care [facility]) Screening Note   Patient Details  Name: Brittney Pace Date of Birth: 20-Nov-1955   Transition of Care [facility]) CM/SW Contact:    Illene Regulus, LCSW Phone Number: 03/29/2022, 2:17 PM    Transition of Care Department [facility]) has reviewed patient and no TOC needs have been identified at this time. We will continue to monitor patient advancement through interdisciplinary progression rounds. If new patient transition needs arise, please place a TOC consult.

## 2022-03-29 NOTE — Progress Notes (Signed)
Pt requested "Do not disturb until 0800". Notified to charge nurse. 0600 Lasix pushed to 0800, morning lab and VS check after 0800.

## 2022-03-29 NOTE — Plan of Care (Signed)
  Problem: Education: Goal: Ability to describe self-care measures that may prevent or decrease complications (Diabetes Survival Skills Education) will improve Outcome: Progressing Goal: Individualized Educational Video(s) Outcome: Progressing   Problem: Cardiac: Goal: Ability to maintain an adequate cardiac output will improve Outcome: Progressing   Problem: Health Behavior/Discharge Planning: Goal: Ability to identify and utilize available resources and services will improve Outcome: Progressing Goal: Ability to manage health-related needs will improve Outcome: Progressing   Problem: Fluid Volume: Goal: Ability to achieve a balanced intake and output will improve Outcome: Progressing   Problem: Metabolic: Goal: Ability to maintain appropriate glucose levels will improve Outcome: Progressing   Problem: Nutritional: Goal: Maintenance of adequate nutrition will improve Outcome: Progressing Goal: Maintenance of adequate weight for body size and type will improve Outcome: Progressing   Problem: Respiratory: Goal: Will regain and/or maintain adequate ventilation Outcome: Progressing   Problem: Urinary Elimination: Goal: Ability to achieve and maintain adequate renal perfusion and functioning will improve Outcome: Progressing   Problem: Education: Goal: Knowledge of General Education information will improve Description: Including pain rating scale, medication(s)/side effects and non-pharmacologic comfort measures Outcome: Progressing   Problem: Health Behavior/Discharge Planning: Goal: Ability to manage health-related needs will improve Outcome: Progressing   Problem: Clinical Measurements: Goal: Ability to maintain clinical measurements within normal limits will improve Outcome: Progressing Goal: Will remain free from infection Outcome: Progressing Goal: Diagnostic test results will improve Outcome: Progressing Goal: Respiratory complications will improve Outcome:  Progressing Goal: Cardiovascular complication will be avoided Outcome: Progressing   Problem: Activity: Goal: Risk for activity intolerance will decrease Outcome: Progressing   Problem: Nutrition: Goal: Adequate nutrition will be maintained Outcome: Progressing   Problem: Coping: Goal: Level of anxiety will decrease Outcome: Progressing   Problem: Elimination: Goal: Will not experience complications related to urinary retention Outcome: Progressing   Problem: Elimination: Goal: Will not experience complications related to bowel motility Outcome: Not Progressing

## 2022-03-29 NOTE — Progress Notes (Signed)
PROGRESS NOTE    Brittney Pace  [id number] DOB: 1955/05/29 DOA: 03/27/2022  PCP: [name], MD   Brief Narrative: This 67 years old female with PMH significant for chronic kidney disease stage IV, type 1 diabetes mellitus, anemia of chronic disease, fibromyalgia, hypothyroidism and diastolic CHF presented in the ED with worsening lower extremity swelling and shortness of breath.  Patient was recently hospitalized from 03/18/2022 through [id number] for DKA complicated with AKI on CKD and hyponatremia.  Patient was treated with insulin, IV fluids,  at the time of discharge she was feeling much better and was instructed to resume her oral diuretic therapy.  At home she has developed progressive worsening lower extremity edema associated with cough. She also reports limited mobility due to heaviness in the legs and dyspnea on exertion.  Her symptoms been worse for last 48 hours.  Patient has increased her diuretic therapy at home with partial improvement in her edema.  Patient is admitted for further evaluation  Assessment & Plan:   Principal Problem:   Acute on chronic diastolic CHF (congestive heart failure) (HCC) Active Problems:   CKD (chronic kidney disease) stage 4, GFR 15-29 ml/min (HCC)   HTN (hypertension)   CAD (coronary artery disease)   Diabetes mellitus type I (HCC)   Fibromyalgia   Hypothyroidism  Acute on chronic diastolic CHF: Patient presented with worsening shortness of breath and bilateral leg edema.  BNP 583 Recent echo showed LVEF 55 to 60%, No LVH, No RWMA, No valvular disease. Continued IV diuresis with furosemide 60 mg every 12 hours. Daily weight, Monitor intake / output charting. Continue carvedilol, and afterload reduction with hydralazine, Continue Imdur 30 mg daily. She is back to her euvolemic state, leg swelling has resolved.. Renal function slightly trended up.  Hold Lasix today. Can be resumed on home torsemide at discharge.  AKI on CKD stage  IV: Likely secondary to diuretic use at home. Baseline serum creatinine 1.97. Continued on diuresis with Lasix 60 mg IV every 12 hours Avoid hypotension and nephrotoxic medications. Renal functions trended up.  Serum creatinine 1.97> 2.32> 2.54. Patient wants to establish care with nephrologist. Dr. Jonnie Finner was notified about consult.  Essential hypertension: Continue carvedilol, hydralazine and Imdur.  Coronary artery disease: Patient denies any chest pain. Continue aspirin. Patient is not on a statin therapy  DM I: Resume insulin pump for glucose control.  Fibromyalgia: Continue home pain medications. Continue lamotrigine  Depression: Continue Remeron.  Hypothyroidism: Continue levothyroxine   DVT prophylaxis: Heparin sq Code Status: DNR Family Communication: No family at bed side. Disposition Plan:    Status is: Inpatient Remains inpatient appropriate because:  Admitted for acute on chronic diastolic CHF requiring IV diuresis.  She is back to her baseline euvolemic state.  Renal functions worsened.  Nephrology is consulted  Consultants:  Nephrology  Procedures: None  Antimicrobials: None  Subjective: Patient was seen and examined at bedside.  Overnight events noted. Patient reports doing much better, reports leg swelling has significantly improved.   She feels back to her baseline weight.  Objective: Vitals:   03/28/22 1744 03/28/22 2107 03/29/22 0349 03/29/22 1213  BP: (!) 135/53 130/68  (!) 145/56  Pulse: 72 68  67  Resp: '20 18  19  '$ Temp: 98.7 F (37.1 C) 98.6 F (37 C)  98.6 F (37 C)  TempSrc: Oral Oral Other (Comment) Oral  SpO2: 93% 95%  100%  Weight:      Height:        Intake/Output  Summary (Last 24 hours) at 03/29/2022 1310 Last data filed at 03/29/2022 0900 Gross per 24 hour  Intake 400 ml  Output --  Net 400 ml   Filed Weights   03/27/22 1218  Weight: 47.6 kg    Examination:  General exam: Appears comfortable, not in any acute  distress, deconditioned. Respiratory system: CTA bilaterally, respiratory effort normal, RR 13. Cardiovascular system: S1 & S2 heard, regular rate and rhythm, murmur ++. Gastrointestinal system: Abdomen is soft, non tender, non distended, BS+ Central nervous system: Alert and oriented x 3. No focal neurological deficits. Extremities: No edema, no cyanosis, no clubbing. Skin: No rashes, lesions or ulcers Psychiatry: Judgement and insight appear normal. Mood & affect appropriate.     Data Reviewed: I have personally reviewed following labs and imaging studies  CBC: Recent Labs  Lab 03/27/22 1240 03/28/22 0645 03/28/22 0902  WBC 10.3 11.2*  --   NEUTROABS 7.0 8.4*  --   HGB 9.9* 7.9* 8.0*  HCT 30.7* 23.5* 25.2*  MCV 85.8 85.5  --   PLT 506* 439*  --    Basic Metabolic Panel: Recent Labs  Lab 03/27/22 1240 03/28/22 0645 03/29/22 0755  NA 130* 136 136  K 3.8 3.7 3.4*  CL 97* 104 103  CO2 20* 22 25  GLUCOSE 222* 166* 150*  BUN 37* 39* 42*  CREATININE 1.97* 2.32* 2.54*  CALCIUM 8.5* 8.4* 8.5*  MG  --   --  1.9  PHOS  --   --  5.2*   GFR: Estimated Creatinine Clearance: 14.7 mL/min (A) (by C-G formula based on SCr of 2.54 mg/dL (H)). Liver Function Tests: Recent Labs  Lab 03/27/22 1240 03/28/22 0645  AST 23 15  ALT 34 25  ALKPHOS 280* 193*  BILITOT 0.9 0.6  PROT 7.1 5.5*  ALBUMIN 3.5 2.6*   No results for input(s): "LIPASE", "AMYLASE" in the last 168 hours. No results for input(s): "AMMONIA" in the last 168 hours. Coagulation Profile: No results for input(s): "INR", "PROTIME" in the last 168 hours. Cardiac Enzymes: No results for input(s): "CKTOTAL", "CKMB", "CKMBINDEX", "TROPONINI" in the last 168 hours. BNP (last 3 results) No results for input(s): "PROBNP" in the last 8760 hours. HbA1C: No results for input(s): "HGBA1C" in the last 72 hours. CBG: Recent Labs  Lab 03/28/22 1618 03/28/22 2106 03/29/22 0345 03/29/22 0755 03/29/22 1215  GLUCAP 156*  203* 187* 156* 306*   Lipid Profile: No results for input(s): "CHOL", "HDL", "LDLCALC", "TRIG", "CHOLHDL", "LDLDIRECT" in the last 72 hours. Thyroid Function Tests: No results for input(s): "TSH", "T4TOTAL", "FREET4", "T3FREE", "THYROIDAB" in the last 72 hours. Anemia Panel: No results for input(s): "VITAMINB12", "FOLATE", "FERRITIN", "TIBC", "IRON", "RETICCTPCT" in the last 72 hours. Sepsis Labs: No results for input(s): "PROCALCITON", "LATICACIDVEN" in the last 168 hours.  No results found for this or any previous visit (from the past 240 hour(s)).   Radiology Studies: DG Chest 2 View  Result Date: 03/27/2022 CLINICAL DATA:  That's shortness of breath. EXAM: CHEST - 2 VIEW COMPARISON:  03/18/2022 FINDINGS: The cardio pericardial silhouette is enlarged. Lungs are hyperexpanded. Bibasilar atelectasis or infiltrate is associated with small bilateral pleural effusions. Interstitial markings are diffusely coarsened with chronic features. Bones are diffusely demineralized. Fixation hardware noted proximal right humerus. Telemetry leads overlie the chest. IMPRESSION: Bibasilar atelectasis or infiltrate with small bilateral pleural effusions. Emphysema. Electronically Signed   By: Misty Stanley M.D.   On: 03/27/2022 13:28    Scheduled Meds:  aspirin EC  325  mg Oral Daily   carvedilol  12.5 mg Oral BID WC   enoxaparin (LOVENOX) injection  30 mg Subcutaneous Q24H   furosemide  40 mg Oral Daily   hydrALAZINE  25 mg Oral TID   insulin pump   Subcutaneous TID WC, HS, 0200   isosorbide mononitrate  30 mg Oral Daily   lamoTRIgine  150 mg Oral Daily   lipase/protease/amylase  12,000 Units Oral TID WC   loratadine  10 mg Oral Daily   mirtazapine  30 mg Oral QHS   pantoprazole  40 mg Oral Daily   thyroid  60 mg Oral Daily   Continuous Infusions:   LOS: 2 days    Time spent: 35 mins    Duard Brady, MD [facility]   If 7PM-7AM, please contact night-coverage

## 2022-03-30 LAB — BASIC METABOLIC PANEL
Anion gap: 8 (ref 5–15)
BUN: 43 mg/dL — ABNORMAL HIGH (ref 8–23)
CO2: 24 mmol/L (ref 22–32)
Calcium: 8.4 mg/dL — ABNORMAL LOW (ref 8.9–10.3)
Chloride: 105 mmol/L (ref 98–111)
Creatinine, Ser: 2.28 mg/dL — ABNORMAL HIGH (ref 0.44–1.00)
GFR, Estimated: 23 mL/min — ABNORMAL LOW (ref >60)
Glucose, Bld: 150 mg/dL — ABNORMAL HIGH (ref 70–99)
Potassium: 3.6 mmol/L (ref 3.5–5.1)
Sodium: 137 mmol/L (ref 135–145)

## 2022-03-30 LAB — GLUCOSE, CAPILLARY
Glucose-Capillary: 165 mg/dL — ABNORMAL HIGH (ref 70–99)
Glucose-Capillary: 163 mg/dL — ABNORMAL HIGH (ref 70–99)

## 2022-03-30 LAB — PHOSPHORUS: Phosphorus: 5.6 mg/dL — ABNORMAL HIGH (ref 2.5–4.6)

## 2022-03-30 LAB — MAGNESIUM: Magnesium: 1.7 mg/dL (ref 1.7–2.4)

## 2022-03-30 MED ORDER — TORSEMIDE 20 MG PO TABS: 20.0000 mg | ORAL_TABLET | Freq: Every day | ORAL | Status: DC

## 2022-03-30 MED ORDER — TORSEMIDE 20 MG PO TABS: 20.0000 mg | ORAL_TABLET | Freq: Every evening | ORAL | 0 refills | Status: DC | PRN

## 2022-03-30 MED ORDER — TORSEMIDE 20 MG PO TABS: 20.0000 mg | ORAL_TABLET | Freq: Every evening | ORAL | Status: DC | PRN

## 2022-03-30 MED ORDER — TORSEMIDE 20 MG PO TABS: 20.0000 mg | ORAL_TABLET | Freq: Every day | ORAL | 1 refills | Status: DC

## 2022-03-30 MED ORDER — ISOSORBIDE MONONITRATE ER 30 MG PO TB24: 30.0000 mg | ORAL_TABLET | Freq: Every day | ORAL | 1 refills | Status: DC

## 2022-03-30 NOTE — Consult Note (Signed)
Renal Service Consult Note [facility]  Brittney Pace 03/30/2022 Sol Blazing, MD Requesting Physician: Dr. Tyrell Antonio  Reason for Consult: CKD pt admitted for SOB and vol overload HPI: The patient is a 67 y.o. year-old w/ PMH as below who presented to ED on 3/03 w/ c/o swollen legs and concern about her kidneys failing. SOB also. Pt was taking diuretics at home as described but did not help. In hospital CXR showed +vasc congestion w/o edema. BNP was up, creat stable at 1.9- 2.2. Pt was diuresed and is feeling better. She wants to change nephrologist from [facility] to [facility] team. We are asked to see for CKD management.    Pt seen in room. No SOB , leg swelling has resolved. Has no voiding issues. Lives w/ her husband at home.   States she had low BP for many years after dx of DMT1 when she was younger. Then her BP's were high. She says she also has Lyme's dz and FM and cannot exercise anymore which is what she used to do to feel good. She is on the fence about doing HD if it were needed.   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   Family history of adverse reaction to anesthesia    mother: " while she was under she stopped breathing."   Fibromyalgia    Gastroparesis    GERD (gastroesophageal reflux disease)    Headache    migraines   Hypothyroidism    IBS (irritable bowel syndrome)    Idiopathic edema    Iron deficiency anemia 09/04/2019   Lyme disease    Mitral valve prolapse     Myocardial infarction ([place])    1 major in 1999 and 2 minor " small vessel disease."   Osteoporosis    Peripheral neuropathy    Peripheral vascular disease (HCC)    Sinus disorder    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   Stroke ([place])    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" " the second stroke was a lacunar    Past Surgical History  Past Surgical History:  Procedure Laterality Date   ABDOMINAL HYSTERECTOMY     APPENDECTOMY     BREAST SURGERY     B/L biopsy and lumpectomy    CARDIAC CATHETERIZATION     CARPAL TUNNEL RELEASE     CATARACT EXTRACTION W/ INTRAOCULAR LENS IMPLANT     right eye   COLONOSCOPY W/ BIOPSIES AND POLYPECTOMY     CORONARY ARTERY BYPASS GRAFT     coronary artery stents     at [place] and [place]   ECTOPIC PREGNANCY SURGERY     NASAL SEPTUM SURGERY     OPEN REDUCTION INTERNAL FIXATION (ORIF) DISTAL RADIAL FRACTURE Right 05/06/2015   Procedure: OPEN REDUCTION INTERNAL FIXATION (ORIF) RIGHT DISTAL RADIAL FRACTURE AND REPAIRS AS NEEDED;  Surgeon: Iran Planas, MD;  Location: [place];  Service: Orthopedics;  Laterality: Right;   ORIF HUMERUS FRACTURE Right 04/30/2020   Procedure: OPEN REDUCTION INTERNAL FIXATION (ORIF) PROXIMAL HUMERUS FRACTURE;  Surgeon: Justice Britain, MD;  Location: [facility];  Service: Orthopedics;  Laterality: Right;  127mn   ORIF WRIST FRACTURE Left 11/05/2014   ORIF WRIST FRACTURE Left 11/05/2014   Procedure: OPEN REDUCTION INTERNAL FIXATION (ORIF) LEFT WRIST FRACTURE AND REPAIR AS INDICATED;  Surgeon: FIran Planas MD;  [place]  Service: Orthopedics;  Laterality: Left;   TRIGGER FINGER RELEASE     Family History  Family History  Problem Relation Age of Onset   Breast cancer Mother    Migraines Mother    Heart disease Father    Hypertension Father    Breast cancer Sister    Social History  reports that she quit smoking about 39 years ago. Her smoking use included cigarettes. She has a 10.00 pack-year smoking history. She has never used  smokeless tobacco. She reports current alcohol use. She reports that she does not use drugs. Allergies  Allergies  Allergen Reactions   Morphine Anaphylaxis and Shortness Of Breath   Penicillins Hives    Tolerated ANCEF on 04/30/20 Has patient had a PCN reaction causing immediate rash, facial/tongue/throat swelling, SOB or lightheadedness with hypotension: Yes Has patient had a PCN reaction causing severe rash involving mucus membranes or skin necrosis: No Has patient had a PCN reaction that required hospitalization No Has patient had a PCN reaction occurring within the last 10 years: No If all of the above answers are "NO", then may proceed with Cephalosporin use.   Tramadol Anaphylaxis   Acetaminophen Other (See Comments)    Alters insulin pump readings    Amitriptyline Other (See Comments)    Severe headache/ out of body feeling   Codeine Other (See Comments)    Severe headaches/ out of body feeling    Ibuprofen Other (See Comments)    Messes up CGM reading on glucose monitor     Krill Oil Diarrhea, Itching and Nausea And Vomiting   Losartan Cough   Propoxyphene Other (See Comments)    Severe headaches / out of body feeling    Shellfish Allergy Diarrhea and Nausea And Vomiting   Statins Other (See Comments)    Muscle pains Other reaction(s): Other   Sulfamethoxazole Other (See Comments)    Mouth ulcers    Sulfites Itching and Other (See Comments)    Mouth ulcers   Fluconazole     Other reaction(s): Contact Dermatitis (intolerance)   Norvasc [Amlodipine Besylate] Swelling   Home medications Prior to Admission medications   Medication Sig Start Date End Date Taking? Authorizing Provider  aspirin EC 325 MG tablet Take 1 tablet (325 mg total) by mouth daily. Patient taking differently: Take 325 mg by mouth at bedtime. 03/06/19  Yes CLelon Perla MD  carvedilol (COREG) 12.5 MG tablet Take 1 tablet (12.5 mg total) by mouth 2 (two) times daily with a meal. 05/07/21  Yes  Crenshaw, BDenice Bors MD  cetirizine (ZYRTEC) 10 MG tablet Take 10 mg by mouth at bedtime. 03/26/21  Yes [provider]  cholecalciferol 25 MCG (1000 UT) tablet Take 1,000 Units by mouth daily.   Yes [provider]  diphenoxylate-atropine (LOMOTIL) 2.5-0.025 MG tablet TAKE 1 TABLET BY MOUTH 4 TIMES DAILY AS NEEDED FOR DIARRHEA OR  LOOSE  STOOLS Patient taking differently: Take 1 tablet by mouth 4 (four) times daily as needed for diarrhea or loose stools. 12/24/20  Yes EVolanda Napoleon  MD  esomeprazole (NEXIUM) 20 MG capsule Take 20 mg by mouth every morning.    Yes [provider]  Fluocinolone Acetonide 0.01 % OIL Place 2 Doses in ear(s) daily.   Yes [provider]  hydrALAZINE (APRESOLINE) 25 MG tablet Take 1 tablet (25 mg total) by mouth 3 (three) times daily. Patient taking differently: Take 25 mg by mouth 2 (two) times daily. Supposed to take 1 tablet TID, but patient taking only 1 tablet 2 times daily 02/15/22 08/14/22 Yes Crenshaw, Denice Bors, MD  Insulin Human (INSULIN PUMP) SOLN Inject 1 each into the skin continuous. Novolog insulin   Yes [provider]  ketoconazole (NIZORAL) 2 % shampoo Apply 1 Application topically 2 (two) times a week.   Yes [provider]  lamoTRIgine (LAMICTAL) 150 MG tablet Take 150 mg by mouth at bedtime. 01/25/22  Yes [provider]  lipase/protease/amylase (CREON) 12000-38000 units CPEP capsule Take 12,000-38,000 Units by mouth See admin instructions. Takes 2 tablets with each meals.   Yes [provider]  Melatonin Gummies 2.5 MG CHEW Chew 2.5 mg by mouth at bedtime as needed (sleep).   Yes [provider]  mirtazapine (REMERON) 30 MG tablet Take 30 mg by mouth at bedtime. 10/15/21  Yes [provider]  PREMARIN vaginal cream Place 1 applicator vaginally daily as needed (irritation). 03/24/21  Yes [provider]  Propylene Glycol (SYSTANE BALANCE OP) Apply 1 drop to eye  daily as needed (dry eyes).   Yes [provider]  thyroid (ARMOUR THYROID) 60 MG tablet Take 1 tablet by mouth daily before breakfast. 07/30/21  Yes [provider]  valACYclovir (VALTREX) 500 MG tablet Take 500 mg by mouth daily as needed (Flair up).   Yes [provider]  ACCU-CHEK GUIDE test strip 3 (three) times daily. 05/03/21   [provider]  AMBULATORY NON FORMULARY MEDICATION Incontinence pads per patient preference, #unlimited, refill [phone] Fax to [phone] 12/25/18   Emeterio Reeve, DO  Continuous Blood Gluc Sensor (DEXCOM G6 SENSOR) MISC USE TO CONTINUOUSLY MONITOR BLOOD SUGARS. CHANGE EVERY 10 DAYS 04/02/19   [provider]  isosorbide mononitrate (IMDUR) 30 MG 24 hr tablet Take 1 tablet (30 mg total) by mouth daily. 03/30/22   Regalado, Belkys A, MD  torsemide ([name]) 20 MG tablet Take 1 tablet (20 mg total) by mouth daily with breakfast. 03/31/22   Regalado, Belkys A, MD  torsemide ([name]) 20 MG tablet Take 1 tablet (20 mg total) by mouth at bedtime as needed (for wt gain or leg swelling). 03/30/22   Regalado, Belkys A, MD     Vitals:   03/29/22 1213 03/29/22 2056 03/29/22 2257 03/30/22 0314  BP: (!) 145/56 130/63 (!) 124/52 (!) 133/54  Pulse: 67 70 67 69  Resp: '19 20  20  '$ Temp: 98.6 F (37 C) 98.4 F (36.9 C)  98.6 F (37 C)  TempSrc: Oral Oral  Oral  SpO2: 100% 95%  93%  Weight:      Height:       Exam Gen alert, no distress, a bit frail, pleasant female on room air No rash, cyanosis or gangrene Sclera anicteric, throat clear  No jvd or bruits Chest clear bilat to bases, no rales/ wheezing RRR no MRG Abd soft ntnd no mass or ascites +bs GU defer MS no joint effusions or deformity Ext no LE or UE edema, no wounds or ulcers Neuro is alert, Ox 3 , nf    Home meds include -  creon, asa, coreg, lomitil, nexium, hydralazine 25 bid, insulin pump, remeron, armour thyroid, valtrex, demadex '20mg'$  am and prn '10mg'$  pm,       Date   Creat  eGFR    2016- 2020  0.99- 1.44 39- 60 ml/min, ckd 3a    2021   1.07- 1.29 44- 55 ml/min, ckd 3a     2022   1.08- 1.54 37- 57 ml/min, ckd 3b    Jan 2023  1.64    Feb- july  1.55- 1.85    Aug - dec 2023 1.94- 2.26 23- 28 ml/min, ckd 4    1/11- 03/03/22 2.32- 2.45 21- 42m/min, ckd 4    2/22- 03/21/22 2.33- 4.02     3/03   1.97  27 ml/min, ckd 4    3/04   2.32    3/05   2.54     3/06   2.28  23 ml/min, ckd 4      UA > 300 prot, 0-5 rbc/ wbc    Renal [place] - 9.5/ 10.2 cm kidneys w/o hydro, ^'d echo bilat    BPs 130 / 60 range, HR 70 RR 20 on RA   Assessment/ Plan: CKD 4 - in patient w/ long hx of T1DM, also now w/ HTN. UA's show proteinuria. She likely has diabetic nephropathy. She carries a dx of diast CHF as well, and could have a component of cardiorenal syndrome. She has diuresed and is okay for dc home. Her renal function has been declining and she will likely need higher dose diuretics. I discussed this w/ the patient, will cont torsemide 20 mg qam and ^ torsemide pm to '20mg'$  prn for swelling / wt gain. She would like to be followed by [facility] in [place] our office will contact her to set up an appt. No other suggestions, will sign off.  AHRF / vol overload - resolved w/ IV lasix x 48 hrs DM type 1 Diastolic CHF, acute on chronic - difficult to distinguish from fluid overload due to worsening renal failure.  FM Hypothyroidism HTN - cont coreg / hydralazine      RKelly Splinter MD [facility] 03/30/2022, 12:05 PM  Recent Labs  Lab 03/27/22 1240 03/28/22 0645 03/28/22 0902 03/29/22 0755 03/30/22 0744  HGB 9.9* 7.9* 8.0*  --   --   ALBUMIN 3.5 2.6*  --   --   --   CALCIUM 8.5* 8.4*  --  8.5* 8.4*  PHOS  --   --   --  5.2* 5.6*  CREATININE 1.97* 2.32*  --  2.54* 2.28*  K 3.8 3.7  --  3.4* 3.6   Inpatient medications:  aspirin EC  325 mg Oral Daily   carvedilol  12.5 mg Oral BID WC   hydrALAZINE  25 mg Oral TID   insulin pump   Subcutaneous TID WC, HS, 0200   isosorbide  mononitrate  30 mg Oral Daily   lamoTRIgine  150 mg Oral Daily   lipase/protease/amylase  12,000 Units Oral TID WC   loratadine  10 mg Oral Daily   mirtazapine  30 mg Oral QHS   pantoprazole  40 mg Oral Daily   thyroid  60 mg Oral Daily   [START ON 03/31/2022] torsemide  20 mg Oral Q breakfast    acetaminophen **OR** acetaminophen, ondansetron **OR** ondansetron (ZOFRAN) IV, torsemide

## 2022-03-30 NOTE — Discharge Summary (Signed)
Physician Discharge Summary   Patient: Brittney Pace MRN: [id number] DOB: 04/12/1955  Admit date:     03/27/2022  Discharge date: 03/30/22  Discharge Physician: Elmarie Shiley   PCP: [name], MD   Recommendations at discharge:     Discharge Diagnoses: Principal Problem:   Acute on chronic diastolic CHF (congestive heart failure) ([place]) Active Problems:   CKD (chronic kidney disease) stage 4, GFR 15-29 ml/min (HCC)   HTN (hypertension)   CAD (coronary artery disease)   Diabetes mellitus type I ([place])   Fibromyalgia   Hypothyroidism  Resolved Problems:   * No resolved hospital problems. [facility] Course: This 67 years old female with PMH significant for chronic kidney disease stage IV, type 1 diabetes mellitus, anemia of chronic disease, fibromyalgia, hypothyroidism and diastolic CHF presented in the ED with worsening lower extremity swelling and shortness of breath.  Patient was recently hospitalized from 03/18/2022 through [id number] for DKA complicated with AKI on CKD and hyponatremia.  Patient was treated with insulin, IV fluids,  at the time of discharge she was feeling much better and was instructed to resume her oral diuretic therapy.  At home she has developed progressive worsening lower extremity edema associated with cough. She also reports limited mobility due to heaviness in the legs and dyspnea on exertion.  Her symptoms been worse for last 48 hours.  Patient has increased her diuretic therapy at home with partial improvement in her edema.  Patient is admitted for further evaluation     Assessment and Plan: Acute on chronic diastolic CHF: -Patient presented with worsening shortness of breath and bilateral leg edema.  BNP 583 -Recent echo showed LVEF 55 to 60%, No LVH, No RWMA, No valvular disease. -Treated with  IV diuresis with furosemide 60 mg every 12 hours. -Continue carvedilol, and afterload reduction with hydralazine, -Continue Imdur 30 mg  daily. -Euvolemic.  Plan to resume torsemide 20 mg daily in am. And 20 Mg PM PRN    AKI on CKD stage IV: Likely secondary to diuretic use at home. Baseline serum creatinine 1.97. Received diuresis with Lasix 60 mg IV every 12 hours Avoid hypotension and nephrotoxic medications. Renal functions trended up.  Serum creatinine 1.97> 2.32> 2.54.--2.2 Patient wants to establish care with nephrologist. Dr. Jonnie Finner recommend resumption of torsemide, and close follow up out patient. Ok to discharge patient home today   Essential hypertension: Continue carvedilol, hydralazine and Imdur.   Coronary artery disease: Patient denies any chest pain. Continue aspirin. Patient is not on a statin therapy   DM I: On  insulin pump for glucose control.   Fibromyalgia: Continue home pain medications. Continue lamotrigine   Depression: Continue Remeron.   Hypothyroidism: Continue levothyroxine          Consultants: Nephrology  Procedures performed: none Disposition: Home Diet recommendation:  Discharge Diet Orders (From admission, onward)     Start     Ordered   03/30/22 0000  Diet - low sodium heart healthy        03/30/22 1024           Cardiac diet DISCHARGE MEDICATION: Allergies as of 03/30/2022       Reactions   Morphine Anaphylaxis, Shortness Of Breath   Penicillins Hives   Tolerated ANCEF on 04/30/20 Has patient had a PCN reaction causing immediate rash, facial/tongue/throat swelling, SOB or lightheadedness with hypotension: Yes Has patient had a PCN reaction causing severe rash involving mucus membranes or skin necrosis: No Has patient had  a PCN reaction that required hospitalization No Has patient had a PCN reaction occurring within the last 10 years: No If all of the above answers are "NO", then may proceed with Cephalosporin use.   Tramadol Anaphylaxis   Acetaminophen Other (See Comments)   Alters insulin pump readings   Amitriptyline Other (See Comments)   Severe  headache/ out of body feeling   Codeine Other (See Comments)   Severe headaches/ out of body feeling   Ibuprofen Other (See Comments)   Messes up CGM reading on glucose monitor   Krill Oil Diarrhea, Itching, Nausea And Vomiting   Losartan Cough   Propoxyphene Other (See Comments)   Severe headaches / out of body feeling   Shellfish Allergy Diarrhea, Nausea And Vomiting   Statins Other (See Comments)   Muscle pains Other reaction(s): Other   Sulfamethoxazole Other (See Comments)   Mouth ulcers   Sulfites Itching, Other (See Comments)   Mouth ulcers   Fluconazole    Other reaction(s): Contact Dermatitis (intolerance)   Norvasc [amlodipine Besylate] Swelling        Medication List     STOP taking these medications    NovoLOG 100 UNIT/ML injection Generic drug: insulin aspart       TAKE these medications    Accu-Chek Guide test strip Generic drug: glucose blood 3 (three) times daily.   AMBULATORY NON FORMULARY MEDICATION Incontinence pads per patient preference, #unlimited, refill x99 Fax to [phone]   Armour Thyroid 60 MG tablet Generic drug: thyroid Take 1 tablet by mouth daily before breakfast.   aspirin EC 325 MG tablet Take 1 tablet (325 mg total) by mouth daily. What changed: when to take this   carvedilol 12.5 MG tablet Commonly known as: COREG Take 1 tablet (12.5 mg total) by mouth 2 (two) times daily with a meal.   cetirizine 10 MG tablet Commonly known as: ZYRTEC Take 10 mg by mouth at bedtime.   cholecalciferol 25 MCG (1000 UT) tablet Generic drug: Cholecalciferol Take 1,000 Units by mouth daily.   Dexcom G6 Sensor Misc USE TO CONTINUOUSLY MONITOR BLOOD SUGARS. CHANGE EVERY 10 DAYS   diphenoxylate-atropine 2.5-0.025 MG tablet Commonly known as: LOMOTIL TAKE 1 TABLET BY MOUTH 4 TIMES DAILY AS NEEDED FOR DIARRHEA OR  LOOSE  STOOLS What changed: See the new instructions.   esomeprazole 20 MG capsule Commonly known as: NEXIUM Take 20  mg by mouth every morning.   Fluocinolone Acetonide 0.01 % Oil Place 2 Doses in ear(s) daily.   hydrALAZINE 25 MG tablet Commonly known as: APRESOLINE Take 1 tablet (25 mg total) by mouth 3 (three) times daily. What changed:  when to take this additional instructions   insulin pump Soln Inject 1 each into the skin continuous. Novolog insulin   isosorbide mononitrate 30 MG 24 hr tablet Commonly known as: IMDUR Take 1 tablet (30 mg total) by mouth daily.   ketoconazole 2 % shampoo Commonly known as: NIZORAL Apply 1 Application topically 2 (two) times a week.   lamoTRIgine 150 MG tablet Commonly known as: LAMICTAL Take 150 mg by mouth at bedtime.   lipase/protease/amylase 12000-38000 units Cpep capsule Commonly known as: CREON Take 12,000-38,000 Units by mouth See admin instructions. Takes 2 tablets with each meals.   Melatonin Gummies 2.5 MG Chew Chew 2.5 mg by mouth at bedtime as needed (sleep).   mirtazapine 30 MG tablet Commonly known as: REMERON Take 30 mg by mouth at bedtime.   Premarin vaginal cream Generic drug: conjugated estrogens  Place 1 applicator vaginally daily as needed (irritation).   SYSTANE BALANCE OP Apply 1 drop to eye daily as needed (dry eyes).   torsemide 20 MG tablet Commonly known as: [name] Take 1 tablet (20 mg total) by mouth at bedtime as needed (for wt gain or leg swelling). What changed: You were already taking a medication with the same name, and this prescription was added. Make sure you understand how and when to take each.   torsemide 20 MG tablet Commonly known as: [name] Take 1 tablet (20 mg total) by mouth daily with breakfast. Start taking on: March 31, 2022 What changed: See the new instructions.   valACYclovir 500 MG tablet Commonly known as: VALTREX Take 500 mg by mouth daily as needed (Flair up).        Follow-up Information     [name], MD Follow up in 1 week(s).   Specialty: Oncology Contact  information: [address] [place] [address] [phone]                Discharge Exam: Danley Danker Weights   03/27/22 1218  Weight: 47.6 kg   General; NAD  Condition at discharge: stable  The results of significant diagnostics from this hospitalization (including imaging, microbiology, ancillary and laboratory) are listed below for reference.   Imaging Studies: DG Chest 2 View  Result Date: 03/27/2022 CLINICAL DATA:  That's shortness of breath. EXAM: CHEST - 2 VIEW COMPARISON:  03/18/2022 FINDINGS: The cardio pericardial silhouette is enlarged. Lungs are hyperexpanded. Bibasilar atelectasis or infiltrate is associated with small bilateral pleural effusions. Interstitial markings are diffusely coarsened with chronic features. Bones are diffusely demineralized. Fixation hardware noted proximal right humerus. Telemetry leads overlie the chest. IMPRESSION: Bibasilar atelectasis or infiltrate with small bilateral pleural effusions. Emphysema. Electronically Signed   By: Misty Stanley M.D.   On: 03/27/2022 13:28   ECHOCARDIOGRAM COMPLETE  Result Date: 03/19/2022    ECHOCARDIOGRAM REPORT   Patient Name:   Brittney Pace Date of Exam: 03/19/2022 Medical Rec #:  [id number]        Height:       58.0 in Accession #:    [id number]       Weight:       117.5 lb Date of Birth:  09-14-1955        BSA:          1.453 m Patient Age:    44 years         BP:           145/95 mmHg Patient Gender: F                HR:           67 bpm. Exam Location:  Inpatient Procedure: 2D Echo Indications:    elevated BNP  History:        Patient has prior history of Echocardiogram examinations, most                 recent 01/25/2018. CAD; Risk Factors:Diabetes, Dyslipidemia and                 Hypertension.  Sonographer:    Harvie Junior Referring Phys: [id number] [place]  1. Left ventricular ejection fraction, by estimation, is 55 to 60%. The left ventricle has normal function. The left  ventricle has no regional wall motion abnormalities. Left ventricular diastolic parameters were normal.  2. Right ventricular systolic function is mildly reduced. The  right ventricular size is mildly enlarged. There is mildly elevated pulmonary artery systolic pressure. The estimated right ventricular systolic pressure is 0000000 mmHg.  3. The mitral valve is degenerative. Trivial mitral valve regurgitation.  4. The aortic valve is tricuspid. There is mild calcification of the aortic valve. Aortic valve regurgitation is not visualized. Aortic valve sclerosis is present, with no evidence of aortic valve stenosis. Aortic valve mean gradient measures 5.0 mmHg.  5. The inferior vena cava is normal in size with <50% respiratory variability, suggesting right atrial pressure of 8 mmHg. Comparison(s): No prior Echocardiogram. FINDINGS  Left Ventricle: Left ventricular ejection fraction, by estimation, is 55 to 60%. The left ventricle has normal function. The left ventricle has no regional wall motion abnormalities. The left ventricular internal cavity size was normal in size. There is  no left ventricular hypertrophy. Left ventricular diastolic parameters were normal. Right Ventricle: The right ventricular size is mildly enlarged. No increase in right ventricular wall thickness. Right ventricular systolic function is mildly reduced. There is mildly elevated pulmonary artery systolic pressure. The tricuspid regurgitant  velocity is 2.95 m/s, and with an assumed right atrial pressure of 8 mmHg, the estimated right ventricular systolic pressure is 0000000 mmHg. Left Atrium: Left atrial size was normal in size. Right Atrium: Right atrial size was normal in size. Pericardium: There is no evidence of pericardial effusion. Mitral Valve: The mitral valve is degenerative in appearance. There is mild calcification of the mitral valve leaflet(s). Mild mitral annular calcification. Trivial mitral valve regurgitation. Tricuspid Valve: The  tricuspid valve is grossly normal. Tricuspid valve regurgitation is mild. Aortic Valve: The aortic valve is tricuspid. There is mild calcification of the aortic valve. There is mild aortic valve annular calcification. Aortic valve regurgitation is not visualized. Aortic valve sclerosis is present, with no evidence of aortic valve stenosis. Aortic valve mean gradient measures 5.0 mmHg. Aortic valve peak gradient measures 9.5 mmHg. Aortic valve area, by VTI measures 2.37 cm. Pulmonic Valve: The pulmonic valve was grossly normal. Pulmonic valve regurgitation is mild to moderate. Aorta: The aortic root is normal in size and structure. Venous: The inferior vena cava is normal in size with less than 50% respiratory variability, suggesting right atrial pressure of 8 mmHg. IAS/Shunts: No atrial level shunt detected by color flow Doppler.  LEFT VENTRICLE PLAX 2D LVIDd:         3.90 cm     Diastology LVIDs:         2.50 cm     LV e' medial:    7.51 cm/s LV PW:         0.80 cm     LV E/e' medial:  17.7 LV IVS:        0.80 cm     LV e' lateral:   13.60 cm/s LVOT diam:     2.00 cm     LV E/e' lateral: 9.8 LV SV:         80 LV SV Index:   55 LVOT Area:     3.14 cm                             3D Volume EF: LV Volumes (MOD)           3D EF:        56 % LV vol d, MOD A4C: 91.6 ml LV EDV:       92 ml LV vol s, MOD A4C: 28.9  ml LV ESV:       40 ml LV SV MOD A4C:     91.6 ml LV SV:        52 ml RIGHT VENTRICLE RV Basal diam:  3.00 cm RV Mid diam:    2.70 cm RV S prime:     9.68 cm/s TAPSE (M-mode): 1.2 cm LEFT ATRIUM           Index        RIGHT ATRIUM           Index LA diam:      3.40 cm 2.34 cm/m   RA Area:     10.10 cm LA Vol (A4C): 43.8 ml 30.15 ml/m  RA Volume:   18.30 ml  12.60 ml/m  AORTIC VALVE                     PULMONIC VALVE AV Area (Vmax):    2.64 cm      PV Vmax:          1.21 m/s AV Area (Vmean):   2.40 cm      PV Peak grad:     5.9 mmHg AV Area (VTI):     2.37 cm      PR End Diast Vel: 7.51 msec AV Vmax:            154.50 cm/s AV Vmean:          107.000 cm/s AV VTI:            0.340 m AV Peak Grad:      9.5 mmHg AV Mean Grad:      5.0 mmHg LVOT Vmax:         130.00 cm/s LVOT Vmean:        81.800 cm/s LVOT VTI:          0.256 m LVOT/AV VTI ratio: 0.75  AORTA Ao Root diam: 2.50 cm Ao Asc diam:  2.90 cm MITRAL VALVE                TRICUSPID VALVE MV Area (PHT): 3.85 cm     TR Peak grad:   34.8 mmHg MV Decel Time: 197 msec     TR Vmax:        295.00 cm/s MR Peak grad: 24.2 mmHg MR Vmax:      246.00 cm/s   SHUNTS MV E velocity: 133.00 cm/s  Systemic VTI:  0.26 m MV A velocity: 99.40 cm/s   Systemic Diam: 2.00 cm MV E/A ratio:  1.34 Rozann Lesches MD Electronically signed by Rozann Lesches MD Signature Date/Time: 03/19/2022/3:03:45 PM    Final    US RENAL  Result Date: 03/18/2022 CLINICAL DATA:  Urinary retention. EXAM: RENAL / URINARY TRACT ULTRASOUND COMPLETE COMPARISON:  Abdominal MRI 09/26/2020, CT 09/18/2020 FINDINGS: Right Kidney: Renal measurements: 9.5 x 4.3 x 5 cm = volume: 108 mL. No hydronephrosis. Diffusely increased renal parenchymal echogenicity in thinning of the renal parenchyma. Simple cyst in the mid right kidney measures 1.2 cm. Complex cyst in the medial kidney measures 1.4 x 1.2 x 1.2 cm. Trace perinephric fluid. No evidence of renal calculi. Left Kidney: Renal measurements: 10.2 x 4.5 x 4.8 cm = volume: 115 mL. No hydronephrosis. Increased renal parenchymal echogenicity in thinning of the renal parenchyma. Simple cyst on prior MRI are not well demonstrated. No evidence of solid lesion. No visible renal stone. Trace perinephric fluid. Bladder: Mild diffuse bladder wall thickening at 5 mm. Moderate  bladder distension. Both ureteral jets are demonstrated. Other: None. IMPRESSION: 1. No obstructive uropathy. 2. Increased bilateral renal parenchymal echogenicity typical of chronic medical renal disease. Trace bilateral perinephric fluid is likely chronic. 3. Small simple cysts in the right kidney.  Septated/complex right renal cyst on prior MRI is difficult to delineate by ultrasound. MRI follow-up is recommended on prior abdominal MRI, recommend this be performed on an elective basis in the near future not performed elsewhere. 4. Mild diffuse bladder wall thickening. Moderate bladder distension. Electronically Signed   By: Keith Rake M.D.   On: 03/18/2022 22:59   US Abdomen Limited RUQ (LIVER/GB)  Result Date: 03/18/2022 CLINICAL DATA:  Right upper quadrant abdominal pain for 2 days EXAM: ULTRASOUND ABDOMEN LIMITED RIGHT UPPER QUADRANT COMPARISON:  MRI 09/26/2020.  CT scan 09/18/2020 FINDINGS: Gallbladder: No gallstones or wall thickening visualized. No sonographic Murphy sign noted by sonographer. Common bile duct: Diameter: 7 mm, upper limits of normal for patient's age Liver: No focal lesion identified. Within normal limits in parenchymal echogenicity. Portal vein is patent on color Doppler imaging with normal direction of blood flow towards the liver. Other: None. IMPRESSION: No gallstones or ductal dilatation. Electronically Signed   By: Jill Side M.D.   On: 03/18/2022 19:23   DG Chest Port 1 View  Result Date: 03/18/2022 CLINICAL DATA:  Shortness of breath EXAM: PORTABLE CHEST - 1 VIEW COMPARISON:  06/19/2020 FINDINGS: Cardiomediastinal silhouette and pulmonary vasculature are within normal limits. Median sternotomy changes again seen. Right humeral fixation hardware partially visualized. Mild linear opacity at the left lateral lung base likely due to atelectasis. Lungs otherwise clear. IMPRESSION: No acute cardiopulmonary process. Electronically Signed   By: Miachel Roux M.D.   On: 03/18/2022 18:52    Microbiology: Results for orders placed or performed during the hospital encounter of 03/18/22  MRSA Next Gen by PCR, Nasal     Status: None   Collection Time: 03/19/22 12:40 PM   Specimen: Nasal Mucosa; Nasal Swab  Result Value Ref Range Status   MRSA by PCR Next Gen NOT DETECTED  NOT DETECTED Final    Comment: (NOTE) The GeneXpert MRSA Assay (FDA approved for NASAL specimens only), is one component of a comprehensive MRSA colonization surveillance program. It is not intended to diagnose MRSA infection nor to guide or monitor treatment for MRSA infections. Test performance is not FDA approved in patients less than 9 years old. Performed at [facility], [place] [address] [place],  [address]     Labs: CBC: Recent Labs  Lab 03/27/22 1240 03/28/22 0645 03/28/22 0902  WBC 10.3 11.2*  --   NEUTROABS 7.0 8.4*  --   HGB 9.9* 7.9* 8.0*  HCT 30.7* 23.5* 25.2*  MCV 85.8 85.5  --   PLT 506* 439*  --    Basic Metabolic Panel: Recent Labs  Lab 03/27/22 1240 03/28/22 0645 03/29/22 0755 03/30/22 0744  NA 130* 136 136 137  K 3.8 3.7 3.4* 3.6  CL 97* 104 103 105  CO2 20* '22 25 24  '$ GLUCOSE 222* 166* 150* 150*  BUN 37* 39* 42* 43*  CREATININE 1.97* 2.32* 2.54* 2.28*  CALCIUM 8.5* 8.4* 8.5* 8.4*  MG  --   --  1.9 1.7  PHOS  --   --  5.2* 5.6*   Liver Function Tests: Recent Labs  Lab 03/27/22 1240 03/28/22 0645  AST 23 15  ALT 34 25  ALKPHOS 280* 193*  BILITOT 0.9 0.6  PROT 7.1 5.5*  ALBUMIN 3.5 2.6*   CBG: Recent Labs  Lab 03/29/22 1215 03/29/22 1655 03/29/22 2055 03/30/22 0312 03/30/22 0802  GLUCAP 306* 215* 187* 165* 163*    Discharge time spent: greater than 30 minutes.  Signed: Elmarie Shiley, MD [facility] 03/30/2022

## 2022-04-04 ENCOUNTER — Inpatient Hospital Stay: Payer: Medicare Other

## 2022-04-04 ENCOUNTER — Inpatient Hospital Stay: Payer: Medicare Other | Admitting: Hematology & Oncology

## 2022-04-05 ENCOUNTER — Inpatient Hospital Stay (HOSPITAL_BASED_OUTPATIENT_CLINIC_OR_DEPARTMENT_OTHER): Payer: Medicare Other | Admitting: Hematology & Oncology

## 2022-04-05 ENCOUNTER — Other Ambulatory Visit: Payer: Self-pay

## 2022-04-05 ENCOUNTER — Inpatient Hospital Stay: Payer: Medicare Other | Attending: Hematology & Oncology

## 2022-04-05 ENCOUNTER — Encounter: Payer: Self-pay | Admitting: Hematology & Oncology

## 2022-04-05 ENCOUNTER — Inpatient Hospital Stay: Payer: Medicare Other

## 2022-04-05 VITALS — BP 149/57 | HR 54 | Temp 97.8°F | Resp 18 | Ht 59.0 in | Wt 107.0 lb

## 2022-04-05 DIAGNOSIS — D500 Iron deficiency anemia secondary to blood loss (chronic): Secondary | ICD-10-CM

## 2022-04-05 DIAGNOSIS — E1022 Type 1 diabetes mellitus with diabetic chronic kidney disease: Secondary | ICD-10-CM

## 2022-04-05 DIAGNOSIS — D631 Anemia in chronic kidney disease: Secondary | ICD-10-CM | POA: Diagnosis not present

## 2022-04-05 DIAGNOSIS — D460 Refractory anemia without ring sideroblasts, so stated: Secondary | ICD-10-CM | POA: Diagnosis present

## 2022-04-05 DIAGNOSIS — Z79899 Other long term (current) drug therapy: Secondary | ICD-10-CM | POA: Diagnosis not present

## 2022-04-05 DIAGNOSIS — D638 Anemia in other chronic diseases classified elsewhere: Secondary | ICD-10-CM

## 2022-04-05 LAB — CMP (CANCER CENTER ONLY)
ALT: 16 U/L (ref 0–44)
AST: 21 U/L (ref 15–41)
Albumin: 3.3 g/dL — ABNORMAL LOW (ref 3.5–5.0)
Alkaline Phosphatase: 155 U/L — ABNORMAL HIGH (ref 38–126)
Anion gap: 10 (ref 5–15)
BUN: 48 mg/dL — ABNORMAL HIGH (ref 8–23)
CO2: 21 mmol/L — ABNORMAL LOW (ref 22–32)
Calcium: 8.7 mg/dL — ABNORMAL LOW (ref 8.9–10.3)
Chloride: 96 mmol/L — ABNORMAL LOW (ref 98–111)
Creatinine: 2.35 mg/dL — ABNORMAL HIGH (ref 0.44–1.00)
GFR, Estimated: 22 mL/min — ABNORMAL LOW (ref >60)
Glucose, Bld: 114 mg/dL — ABNORMAL HIGH (ref 70–99)
Potassium: 4.4 mmol/L (ref 3.5–5.1)
Sodium: 127 mmol/L — ABNORMAL LOW (ref 135–145)
Total Bilirubin: 0.6 mg/dL (ref 0.3–1.2)
Total Protein: 6.4 g/dL — ABNORMAL LOW (ref 6.5–8.1)

## 2022-04-05 LAB — CBC WITH DIFFERENTIAL (CANCER CENTER ONLY)
Abs Immature Granulocytes: 0.12 10*3/uL — ABNORMAL HIGH (ref 0.00–0.07)
Basophils Absolute: 0.1 10*3/uL (ref 0.0–0.1)
Basophils Relative: 1 %
Eosinophils Absolute: 0.6 10*3/uL — ABNORMAL HIGH (ref 0.0–0.5)
Eosinophils Relative: 8 %
HCT: 25.2 % — ABNORMAL LOW (ref 36.0–46.0)
Hemoglobin: 8.2 g/dL — ABNORMAL LOW (ref 12.0–15.0)
Immature Granulocytes: 2 %
Lymphocytes Relative: 13 %
Lymphs Abs: 1.1 10*3/uL (ref 0.7–4.0)
MCH: 27.1 pg (ref 26.0–34.0)
MCHC: 32.5 g/dL (ref 30.0–36.0)
MCV: 83.2 fL (ref 80.0–100.0)
Monocytes Absolute: 0.6 10*3/uL (ref 0.1–1.0)
Monocytes Relative: 7 %
Neutro Abs: 5.8 10*3/uL (ref 1.7–7.7)
Neutrophils Relative %: 69 %
Platelet Count: 353 10*3/uL (ref 150–400)
RBC: 3.03 MIL/uL — ABNORMAL LOW (ref 3.87–5.11)
RDW: 14.6 % (ref 11.5–15.5)
WBC Count: 8.3 10*3/uL (ref 4.0–10.5)
nRBC: 0 % (ref 0.0–0.2)

## 2022-04-05 LAB — RETICULOCYTES
Immature Retic Fract: 6 % (ref 2.3–15.9)
RBC.: 2.98 MIL/uL — ABNORMAL LOW (ref 3.87–5.11)
Retic Count, Absolute: 29.8 10*3/uL (ref 19.0–186.0)
Retic Ct Pct: 1 % (ref 0.4–3.1)

## 2022-04-05 LAB — IRON AND IRON BINDING CAPACITY (CC-WL,HP ONLY)
Iron: 74 ug/dL (ref 28–170)
Saturation Ratios: 23 % (ref 10.4–31.8)
TIBC: 321 ug/dL (ref 250–450)
UIBC: 247 ug/dL (ref 148–442)

## 2022-04-05 LAB — FERRITIN: Ferritin: 115 ng/mL (ref 11–307)

## 2022-04-05 LAB — LACTATE DEHYDROGENASE: LDH: 174 U/L (ref 98–192)

## 2022-04-05 MED ORDER — DARBEPOETIN ALFA 300 MCG/0.6ML IJ SOSY
300.0000 ug | PREFILLED_SYRINGE | Freq: Once | INTRAMUSCULAR | Status: AC
  Administered 2022-04-05: 300 ug via SUBCUTANEOUS
  Filled 2022-04-05: qty 0.6

## 2022-04-05 NOTE — Progress Notes (Signed)
Hematology and Oncology Follow Up Visit  Brittney Pace [id number] [birth year] 67 y.o. 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  Thankfully, she looks a whole lot better.  The last time that we saw her, we had to get her admitted.  She had a heart failure.  She had kidney failure.  She improved with diuretics.  She is quite anemic.  Hemoglobin is 8.2.  We really need to give her some Aranesp.  Again she feels much better.  She needs to see the Nephrologist.  I gave her the phone number of the Nephrologist.  She also needs to set herself up with a family doctor.  Hopefully, she can find 1.  I am just happy that she is feeling better.  She does looks better.  She does not look nearly as swollen.  She is watching her blood sugars.  Today, her blood sugar is 114.  She has had no problems with fever.  There is been no headache.  She has had no mouth sores.  She has had a little bit of diarrhea.  When we saw her, her ferritin was 118 with an iron saturation of 16%.  It will be interesting to see what the iron saturation is now.  She is wearing compression stockings.  I did give her a prescription for measured compression stockings to be made down in [place].  I think this will really help her out.  Currently, I would say that her performance status is probably ECOG 2-3.   Medications:  Allergies as of 04/05/2022       Reactions   Morphine Anaphylaxis, Shortness Of Breath   Penicillins Hives   Tolerated ANCEF on 04/30/20 Has patient had a PCN reaction causing immediate rash, facial/tongue/throat swelling, SOB or lightheadedness with hypotension: Yes Has patient had a PCN reaction causing severe rash involving mucus membranes or skin necrosis: No Has patient  had a PCN reaction that required hospitalization No Has patient had a PCN reaction occurring within the last 10 years: No If all of the above answers are "NO", then may proceed with Cephalosporin use.   Tramadol Anaphylaxis   Acetaminophen Other (See Comments)   Alters insulin pump readings   Amitriptyline Other (See Comments)   Severe headache/ out of body feeling   Codeine Other (See Comments)   Severe headaches/ out of body feeling   Ibuprofen Other (See Comments)   Messes up CGM reading on glucose monitor   Krill Oil Diarrhea, Itching, Nausea And Vomiting   Losartan Cough   Propoxyphene Other (See Comments)   Severe headaches / out of body feeling   Shellfish Allergy Diarrhea, Nausea And Vomiting   Statins Other (See Comments)   Muscle pains Other reaction(s): Other   Sulfamethoxazole Other (See Comments)   Mouth ulcers   Sulfites Itching, Other (See Comments)   Mouth ulcers   Fluconazole    Other reaction(s): Contact Dermatitis (intolerance)   Norvasc [amlodipine Besylate] Swelling        Medication List        Accurate as of April 05, 2022 11:26 AM. If you have any questions, ask your nurse or doctor.          Accu-Chek Guide test strip Generic drug:  glucose blood 3 (three) times daily.   AMBULATORY NON FORMULARY MEDICATION Incontinence pads per patient preference, #unlimited, refill x99 Fax to [phone]   Armour Thyroid 60 MG tablet Generic drug: thyroid Take 1 tablet by mouth daily before breakfast.   aspirin EC 325 MG tablet Take 1 tablet (325 mg total) by mouth daily. What changed: when to take this   carvedilol 12.5 MG tablet Commonly known as: COREG Take 1 tablet (12.5 mg total) by mouth 2 (two) times daily with a meal.   cetirizine 10 MG tablet Commonly known as: ZYRTEC Take 10 mg by mouth at bedtime.   cholecalciferol 25 MCG (1000 UT) tablet Generic drug: Cholecalciferol Take 1,000 Units by mouth daily.   Dexcom G6 Sensor Misc USE  TO CONTINUOUSLY MONITOR BLOOD SUGARS. CHANGE EVERY 10 DAYS   diphenoxylate-atropine 2.5-0.025 MG tablet Commonly known as: LOMOTIL TAKE 1 TABLET BY MOUTH 4 TIMES DAILY AS NEEDED FOR DIARRHEA OR  LOOSE  STOOLS What changed: See the new instructions.   esomeprazole 20 MG capsule Commonly known as: NEXIUM Take 20 mg by mouth every morning.   Fluocinolone Acetonide 0.01 % Oil Place 2 Doses in ear(s) daily.   hydrALAZINE 25 MG tablet Commonly known as: APRESOLINE Take 1 tablet (25 mg total) by mouth 3 (three) times daily. What changed:  when to take this additional instructions   insulin pump Soln Inject 1 each into the skin continuous. Novolog insulin   isosorbide mononitrate 30 MG 24 hr tablet Commonly known as: IMDUR Take 1 tablet (30 mg total) by mouth daily.   ketoconazole 2 % shampoo Commonly known as: NIZORAL Apply 1 Application topically 2 (two) times a week.   lamoTRIgine 150 MG tablet Commonly known as: LAMICTAL Take 150 mg by mouth at bedtime.   lipase/protease/amylase 12000-38000 units Cpep capsule Commonly known as: CREON Take 12,000-38,000 Units by mouth See admin instructions. Takes 2 tablets with each meals.   Melatonin Gummies 2.5 MG Chew Chew 2.5 mg by mouth at bedtime as needed (sleep).   mirtazapine 30 MG tablet Commonly known as: REMERON Take 30 mg by mouth at bedtime.   Premarin vaginal cream Generic drug: conjugated estrogens Place 1 applicator vaginally daily as needed (irritation).   SYSTANE BALANCE OP Apply 1 drop to eye daily as needed (dry eyes).   torsemide 20 MG tablet Commonly known as: [name] Take 1 tablet (20 mg total) by mouth at bedtime as needed (for wt gain or leg swelling).   torsemide 20 MG tablet Commonly known as: [name] Take 1 tablet (20 mg total) by mouth daily with breakfast.   valACYclovir 500 MG tablet Commonly known as: VALTREX Take 500 mg by mouth daily as needed (Flair up).        Allergies:   Allergies  Allergen Reactions   Morphine Anaphylaxis and Shortness Of Breath   Penicillins Hives    Tolerated ANCEF on 04/30/20 Has patient had a PCN reaction causing immediate rash, facial/tongue/throat swelling, SOB or lightheadedness with hypotension: Yes Has patient had a PCN reaction causing severe rash involving mucus membranes or skin necrosis: No Has patient had a PCN reaction that required hospitalization No Has patient had a PCN reaction occurring within the last 10 years: No If all of the above answers are "NO", then may proceed with Cephalosporin use.   Tramadol Anaphylaxis   Acetaminophen Other (See Comments)    Alters insulin pump readings    Amitriptyline Other (See Comments)    Severe headache/ out of body  feeling   Codeine Other (See Comments)    Severe headaches/ out of body feeling    Ibuprofen Other (See Comments)    Messes up CGM reading on glucose monitor     Krill Oil Diarrhea, Itching and Nausea And Vomiting   Losartan Cough   Propoxyphene Other (See Comments)    Severe headaches / out of body feeling    Shellfish Allergy Diarrhea and Nausea And Vomiting   Statins Other (See Comments)    Muscle pains Other reaction(s): Other   Sulfamethoxazole Other (See Comments)    Mouth ulcers    Sulfites Itching and Other (See Comments)    Mouth ulcers   Fluconazole     Other reaction(s): Contact Dermatitis (intolerance)   Norvasc [Amlodipine Besylate] Swelling    Past Medical History, Surgical history, Social history, and Family History were reviewed and updated.  Review of Systems: Review of Systems  Constitutional:  Positive for malaise/fatigue.  HENT: Negative.    Eyes: Negative.   Respiratory: Negative.    Cardiovascular:  Positive for leg swelling.  Gastrointestinal:  Positive for abdominal pain.  Genitourinary: Negative.   Musculoskeletal:  Positive for joint pain and myalgias.  Skin:  Positive for rash.  Neurological: Negative.    Endo/Heme/Allergies: Negative.   Psychiatric/Behavioral: Negative.       Physical Exam:   Wt Readings from Last 3 Encounters:  04/05/22 107 lb (48.5 kg)  03/27/22 105 lb (47.6 kg)  03/19/22 117 lb 8.1 oz (53.3 kg)  Vital signs show temperature 98.2.  Pulse 68.  Blood pressure 173/80.  Weight is 110 pounds.  Physical Exam Vitals reviewed.  HENT:     Head: Normocephalic and atraumatic.  Eyes:     Pupils: Pupils are equal, round, and reactive to light.  Cardiovascular:     Rate and Rhythm: Normal rate and regular rhythm.     Heart sounds: Normal heart sounds.  Pulmonary:     Effort: Pulmonary effort is normal.     Breath sounds: Normal breath sounds.  Abdominal:     General: Bowel sounds are normal.     Palpations: Abdomen is soft.  Musculoskeletal:        General: No tenderness or deformity. Normal range of motion.     Cervical back: Normal range of motion.     Comments: On her extremities, there is some mild edema in her legs.  .  Lymphadenopathy:     Cervical: No cervical adenopathy.  Skin:    General: Skin is warm and dry.     Findings: No erythema or rash.  Neurological:     Mental Status: She is alert and oriented to person, place, and time.  Psychiatric:        Behavior: Behavior normal.        Thought Content: Thought content normal.        Judgment: Judgment normal.     Lab Results  Component Value Date   WBC 8.3 04/05/2022   HGB 8.2 (L) 04/05/2022   HCT 25.2 (L) 04/05/2022   MCV 83.2 04/05/2022   PLT 353 04/05/2022   Lab Results  Component Value Date   FERRITIN 118 03/17/2022   IRON 43 03/17/2022   TIBC 274 03/17/2022   UIBC 231 03/17/2022   IRONPCTSAT 16 03/17/2022   Lab Results  Component Value Date   RETICCTPCT 1.0 04/05/2022   RBC 2.98 (L) 04/05/2022   No results found for: "KPAFRELGTCHN", "LAMBDASER", "KAPLAMBRATIO" No results found for: "IGGSERUM", "IGA", "IGMSERUM"  No results found for: "TOTALPROTELP", "ALBUMINELP", "A1GS", "A2GS",  "BETS", "BETA2SER", "GAMS", "MSPIKE", "SPEI"   Chemistry      Component Value Date/Time   NA 137 03/30/2022 0744   NA 138 07/17/2017 1128   K 3.6 03/30/2022 0744   CL 105 03/30/2022 0744   CO2 24 03/30/2022 0744   BUN 43 (H) 03/30/2022 0744   BUN 23 07/17/2017 1128   CREATININE 2.28 (H) 03/30/2022 0744   CREATININE 3.06 (H) 03/17/2022 1445   CREATININE 2.45 (H) 03/03/2022 1129      Component Value Date/Time   CALCIUM 8.4 (L) 03/30/2022 0744   ALKPHOS 193 (H) 03/28/2022 0645   AST 15 03/28/2022 0645   AST 85 (H) 03/17/2022 1445   ALT 25 03/28/2022 0645   ALT 179 (H) 03/17/2022 1445   BILITOT 0.6 03/28/2022 0645   BILITOT 0.6 03/17/2022 1445       Impression and Plan: Brittney Pace is a very pleasant 67 yo female with multifactorial anemia.  Again, she has multiple health issues.  I am just glad that she is feeling better.  We will go ahead and give her some Aranesp today.  I will try to hold off on transfusing her.  We may have to think about this if we cannot get her hemoglobin better.  I would like to plan to get her back to see [place] in another 3 weeks.  If her iron levels are low, we will get her back for IV iron.   [name], MD 3/12/202411:26 AM

## 2022-04-05 NOTE — Patient Instructions (Signed)

## 2022-04-06 ENCOUNTER — Encounter: Payer: Self-pay | Admitting: *Deleted

## 2022-04-26 ENCOUNTER — Other Ambulatory Visit: Payer: Self-pay | Admitting: Cardiology

## 2022-04-26 DIAGNOSIS — I2510 Atherosclerotic heart disease of native coronary artery without angina pectoris: Secondary | ICD-10-CM

## 2022-04-28 ENCOUNTER — Inpatient Hospital Stay: Payer: Medicare Other

## 2022-04-28 ENCOUNTER — Inpatient Hospital Stay: Payer: Medicare Other | Attending: Hematology & Oncology

## 2022-04-28 ENCOUNTER — Encounter: Payer: Self-pay | Admitting: Medical Oncology

## 2022-04-28 ENCOUNTER — Other Ambulatory Visit: Payer: Self-pay

## 2022-04-28 ENCOUNTER — Inpatient Hospital Stay (HOSPITAL_BASED_OUTPATIENT_CLINIC_OR_DEPARTMENT_OTHER): Payer: Medicare Other | Admitting: Medical Oncology

## 2022-04-28 VITALS — BP 153/58 | HR 58 | Temp 97.4°F | Resp 18 | Ht 59.0 in | Wt 106.0 lb

## 2022-04-28 DIAGNOSIS — D460 Refractory anemia without ring sideroblasts, so stated: Secondary | ICD-10-CM | POA: Insufficient documentation

## 2022-04-28 DIAGNOSIS — E1022 Type 1 diabetes mellitus with diabetic chronic kidney disease: Secondary | ICD-10-CM

## 2022-04-28 DIAGNOSIS — N1831 Chronic kidney disease, stage 3a: Secondary | ICD-10-CM | POA: Diagnosis not present

## 2022-04-28 DIAGNOSIS — Z79899 Other long term (current) drug therapy: Secondary | ICD-10-CM | POA: Insufficient documentation

## 2022-04-28 DIAGNOSIS — D500 Iron deficiency anemia secondary to blood loss (chronic): Secondary | ICD-10-CM

## 2022-04-28 DIAGNOSIS — D631 Anemia in chronic kidney disease: Secondary | ICD-10-CM

## 2022-04-28 DIAGNOSIS — D638 Anemia in other chronic diseases classified elsewhere: Secondary | ICD-10-CM

## 2022-04-28 DIAGNOSIS — N183 Chronic kidney disease, stage 3 unspecified: Secondary | ICD-10-CM

## 2022-04-28 LAB — IRON AND IRON BINDING CAPACITY (CC-WL,HP ONLY)
Iron: 60 ug/dL (ref 28–170)
Saturation Ratios: 18 % (ref 10.4–31.8)
TIBC: 326 ug/dL (ref 250–450)
UIBC: 266 ug/dL (ref 148–442)

## 2022-04-28 LAB — CBC WITH DIFFERENTIAL (CANCER CENTER ONLY)
Abs Immature Granulocytes: 0.01 10*3/uL (ref 0.00–0.07)
Basophils Absolute: 0.1 10*3/uL (ref 0.0–0.1)
Basophils Relative: 2 %
Eosinophils Absolute: 0.5 10*3/uL (ref 0.0–0.5)
Eosinophils Relative: 9 %
HCT: 32 % — ABNORMAL LOW (ref 36.0–46.0)
Hemoglobin: 10.2 g/dL — ABNORMAL LOW (ref 12.0–15.0)
Immature Granulocytes: 0 %
Lymphocytes Relative: 19 %
Lymphs Abs: 1.2 10*3/uL (ref 0.7–4.0)
MCH: 27.9 pg (ref 26.0–34.0)
MCHC: 31.9 g/dL (ref 30.0–36.0)
MCV: 87.7 fL (ref 80.0–100.0)
Monocytes Absolute: 0.6 10*3/uL (ref 0.1–1.0)
Monocytes Relative: 9 %
Neutro Abs: 3.7 10*3/uL (ref 1.7–7.7)
Neutrophils Relative %: 61 %
Platelet Count: 310 10*3/uL (ref 150–400)
RBC: 3.65 MIL/uL — ABNORMAL LOW (ref 3.87–5.11)
RDW: 15.9 % — ABNORMAL HIGH (ref 11.5–15.5)
WBC Count: 6.1 10*3/uL (ref 4.0–10.5)
nRBC: 0 % (ref 0.0–0.2)

## 2022-04-28 LAB — RETICULOCYTES
Immature Retic Fract: 3.9 % (ref 2.3–15.9)
RBC.: 3.6 MIL/uL — ABNORMAL LOW (ref 3.87–5.11)
Retic Count, Absolute: 53.6 10*3/uL (ref 19.0–186.0)
Retic Ct Pct: 1.5 % (ref 0.4–3.1)

## 2022-04-28 LAB — CMP (CANCER CENTER ONLY)
ALT: 13 U/L (ref 0–44)
AST: 23 U/L (ref 15–41)
Albumin: 3.4 g/dL — ABNORMAL LOW (ref 3.5–5.0)
Alkaline Phosphatase: 87 U/L (ref 38–126)
Anion gap: 11 (ref 5–15)
BUN: 57 mg/dL — ABNORMAL HIGH (ref 8–23)
CO2: 24 mmol/L (ref 22–32)
Calcium: 8.7 mg/dL — ABNORMAL LOW (ref 8.9–10.3)
Chloride: 94 mmol/L — ABNORMAL LOW (ref 98–111)
Creatinine: 2.38 mg/dL — ABNORMAL HIGH (ref 0.44–1.00)
GFR, Estimated: 22 mL/min — ABNORMAL LOW (ref >60)
Glucose, Bld: 140 mg/dL — ABNORMAL HIGH (ref 70–99)
Potassium: 4.4 mmol/L (ref 3.5–5.1)
Sodium: 129 mmol/L — ABNORMAL LOW (ref 135–145)
Total Bilirubin: 0.7 mg/dL (ref 0.3–1.2)
Total Protein: 6.2 g/dL — ABNORMAL LOW (ref 6.5–8.1)

## 2022-04-28 LAB — FERRITIN: Ferritin: 51 ng/mL (ref 11–307)

## 2022-04-28 MED ORDER — DARBEPOETIN ALFA 300 MCG/0.6ML IJ SOSY
300.0000 ug | PREFILLED_SYRINGE | Freq: Once | INTRAMUSCULAR | Status: AC
  Administered 2022-04-28: 300 ug via SUBCUTANEOUS
  Filled 2022-04-28: qty 0.6

## 2022-04-28 NOTE — Progress Notes (Signed)
Hematology and Oncology Follow Up Visit  Brittney Pace [id number] Feb 17, 1955 67 y.o. 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  She reports that she is fatigue which she thinks is a combo of her hemoglobin but also like stress- they are remodeling her master bathroom among other things.   Since our last visit she has secured a new patient appointment with [facility] which she has later today. This is great news.  No fevers, night sweats, unintentional weight loss, bleeding episodes or significant bruising episodes.   At her last visit on 04/06/2022 her ferritin was 115 with an iron saturation of 23%.   Currently, I would say that her performance status is probably ECOG 2-3.   Wt Readings from Last 3 Encounters:  04/28/22 106 lb (48.1 kg)  04/05/22 107 lb (48.5 kg)  03/27/22 105 lb (47.6 kg)    Medications:  Allergies as of 04/28/2022       Reactions   Morphine Anaphylaxis, Shortness Of Breath   Penicillins Hives   Tolerated ANCEF on 04/30/20 Has patient had a PCN reaction causing immediate rash, facial/tongue/throat swelling, SOB or lightheadedness with hypotension: Yes Has patient had a PCN reaction causing severe rash involving mucus membranes or skin necrosis: No Has patient had a PCN reaction that required hospitalization No Has patient had a PCN reaction occurring within the last 10 years: No If all of the above answers are "NO", then may proceed with Cephalosporin use.   Tramadol Anaphylaxis   Acetaminophen Other (See Comments)   Alters insulin pump readings   Amitriptyline Other (See Comments)   Severe headache/ out of body feeling   Codeine Other (See Comments)   Severe headaches/ out of body feeling   Ibuprofen Other (See  Comments)   Messes up CGM reading on glucose monitor   Krill Oil Diarrhea, Itching, Nausea And Vomiting   Losartan Cough   Propoxyphene Other (See Comments)   Severe headaches / out of body feeling   Shellfish Allergy Diarrhea, Nausea And Vomiting   Statins Other (See Comments)   Muscle pains Other reaction(s): Other   Sulfamethoxazole Other (See Comments)   Mouth ulcers   Sulfites Itching, Other (See Comments)   Mouth ulcers   Fluconazole    Other reaction(s): Contact Dermatitis (intolerance)   Norvasc [amlodipine Besylate] Swelling        Medication List        Accurate as of April 28, 2022 11:59 PM. If you have any questions, ask your nurse or doctor.          STOP taking these medications    insulin pump Soln Stopped by: Rushie Chestnut, PA-C       TAKE these medications    Accu-Chek Guide test strip Generic drug: glucose blood 3 (three) times daily.   AMBULATORY NON FORMULARY MEDICATION Incontinence pads per patient preference, #unlimited, refill x99 Fax to [phone]   Armour Thyroid 60 MG tablet Generic drug: thyroid Take 1 tablet by mouth daily before breakfast.   aspirin EC 325 MG tablet Take 1 tablet (325 mg total) by mouth daily. What changed: when to take this   carvedilol 12.5 MG tablet Commonly known as: COREG TAKE 1 TABLET BY MOUTH TWICE DAILY WITH  A MEAL   cetirizine 10 MG tablet Commonly known as: ZYRTEC Take 10 mg by mouth at bedtime.   cholecalciferol 25 MCG (1000 UT) tablet Generic drug: Cholecalciferol Take 1,000 Units by mouth daily.   Dexcom G6 Sensor Misc USE TO CONTINUOUSLY MONITOR BLOOD SUGARS. CHANGE EVERY 10 DAYS   diphenoxylate-atropine 2.5-0.025 MG tablet Commonly known as: LOMOTIL TAKE 1 TABLET BY MOUTH 4 TIMES DAILY AS NEEDED FOR DIARRHEA OR  LOOSE  STOOLS What changed: See the new instructions.   esomeprazole 20 MG capsule Commonly known as: NEXIUM Take 20 mg by mouth every morning.   Fluocinolone  Acetonide 0.01 % Oil Place 2 Doses in ear(s) daily.   hydrALAZINE 25 MG tablet Commonly known as: APRESOLINE Take 1 tablet (25 mg total) by mouth 3 (three) times daily. What changed:  when to take this additional instructions   isosorbide mononitrate 30 MG 24 hr tablet Commonly known as: IMDUR Take 1 tablet (30 mg total) by mouth daily.   ketoconazole 2 % shampoo Commonly known as: NIZORAL Apply 1 Application topically 2 (two) times a week.   lamoTRIgine 150 MG tablet Commonly known as: LAMICTAL Take 150 mg by mouth at bedtime.   lipase/protease/amylase 12000-38000 units Cpep capsule Commonly known as: CREON Take 12,000-38,000 Units by mouth See admin instructions. Takes 2 tablets with each meals.   Melatonin Gummies 2.5 MG Chew Chew 2.5 mg by mouth at bedtime as needed (sleep).   mirtazapine 30 MG tablet Commonly known as: REMERON Take 30 mg by mouth at bedtime.   NovoLOG 100 UNIT/ML injection Generic drug: insulin aspart Inject 25 Units into the skin continuous.   Premarin vaginal cream Generic drug: conjugated estrogens Place 1 applicator vaginally daily as needed (irritation).   SYSTANE BALANCE OP Apply 1 drop to eye daily as needed (dry eyes).   torsemide 20 MG tablet Commonly known as: [name] Take 1 tablet (20 mg total) by mouth at bedtime as needed (for wt gain or leg swelling).   torsemide 20 MG tablet Commonly known as: [name] Take 1 tablet (20 mg total) by mouth daily with breakfast.   valACYclovir 500 MG tablet Commonly known as: VALTREX Take 500 mg by mouth daily as needed (Flair up).        Allergies:  Allergies  Allergen Reactions   Morphine Anaphylaxis and Shortness Of Breath   Penicillins Hives    Tolerated ANCEF on 04/30/20 Has patient had a PCN reaction causing immediate rash, facial/tongue/throat swelling, SOB or lightheadedness with hypotension: Yes Has patient had a PCN reaction causing severe rash involving mucus membranes or  skin necrosis: No Has patient had a PCN reaction that required hospitalization No Has patient had a PCN reaction occurring within the last 10 years: No If all of the above answers are "NO", then may proceed with Cephalosporin use.   Tramadol Anaphylaxis   Acetaminophen Other (See Comments)    Alters insulin pump readings    Amitriptyline Other (See Comments)    Severe headache/ out of body feeling   Codeine Other (See Comments)    Severe headaches/ out of body feeling    Ibuprofen Other (See Comments)    Messes up CGM reading on glucose monitor     Krill Oil Diarrhea, Itching and Nausea And Vomiting   Losartan Cough   Propoxyphene Other (See Comments)    Severe headaches / out of body feeling    Shellfish Allergy Diarrhea and Nausea And Vomiting   Statins Other (See Comments)  Muscle pains Other reaction(s): Other   Sulfamethoxazole Other (See Comments)    Mouth ulcers    Sulfites Itching and Other (See Comments)    Mouth ulcers   Fluconazole     Other reaction(s): Contact Dermatitis (intolerance)   Norvasc [Amlodipine Besylate] Swelling    Past Medical History, Surgical history, Social history, and Family History were reviewed and updated.  Review of Systems: Review of Systems  Constitutional:  Positive for malaise/fatigue.  HENT: Negative.    Eyes: Negative.   Respiratory: Negative.    Cardiovascular:  Positive for leg swelling.  Gastrointestinal:  Negative for abdominal pain.  Genitourinary: Negative.   Musculoskeletal:  Positive for joint pain and myalgias.  Skin:  Negative for rash.  Neurological: Negative.   Endo/Heme/Allergies: Negative.   Psychiatric/Behavioral: Negative.       Wt Readings from Last 3 Encounters:  04/28/22 106 lb (48.1 kg)  04/05/22 107 lb (48.5 kg)  03/27/22 105 lb (47.6 kg)  Vital signs show temperature 98.2.  Pulse 68.  Blood pressure 173/80.  Weight is 110 pounds.  Physical Exam Vitals reviewed.  HENT:     Head:  Normocephalic and atraumatic.  Eyes:     Pupils: Pupils are equal, round, and reactive to light.  Cardiovascular:     Rate and Rhythm: Normal rate and regular rhythm.     Heart sounds: Normal heart sounds.  Pulmonary:     Effort: Pulmonary effort is normal.     Breath sounds: Normal breath sounds.  Abdominal:     General: Bowel sounds are normal.     Palpations: Abdomen is soft.  Musculoskeletal:        General: No tenderness or deformity. Normal range of motion.     Cervical back: Normal range of motion.     Comments: On her extremities, there is some scant edema in her legs.   Lymphadenopathy:     Cervical: No cervical adenopathy.  Skin:    General: Skin is warm and dry.     Findings: No erythema or rash.  Neurological:     Mental Status: She is alert and oriented to person, place, and time.  Psychiatric:        Behavior: Behavior normal.        Thought Content: Thought content normal.        Judgment: Judgment normal.     Lab Results  Component Value Date   WBC 6.1 04/28/2022   HGB 10.2 (L) 04/28/2022   HCT 32.0 (L) 04/28/2022   MCV 87.7 04/28/2022   PLT 310 04/28/2022   Lab Results  Component Value Date   FERRITIN 51 04/28/2022   IRON 60 04/28/2022   TIBC 326 04/28/2022   UIBC 266 04/28/2022   IRONPCTSAT 18 04/28/2022   Lab Results  Component Value Date   RETICCTPCT 1.5 04/28/2022   RBC 3.60 (L) 04/28/2022   No results found for: "KPAFRELGTCHN", "LAMBDASER", "KAPLAMBRATIO" No results found for: "IGGSERUM", "IGA", "IGMSERUM" No results found for: "TOTALPROTELP", "ALBUMINELP", "A1GS", "A2GS", "BETS", "BETA2SER", "GAMS", "MSPIKE", "SPEI"   Chemistry      Component Value Date/Time   NA 129 (L) 04/28/2022 1016   NA 138 07/17/2017 1128   K 4.4 04/28/2022 1016   CL 94 (L) 04/28/2022 1016   CO2 24 04/28/2022 1016   BUN 57 (H) 04/28/2022 1016   BUN 23 07/17/2017 1128   CREATININE 2.38 (H) 04/28/2022 1016   CREATININE 2.45 (H) 03/03/2022 1129       Component Value  Date/Time   CALCIUM 8.7 (L) 04/28/2022 1016   ALKPHOS 87 04/28/2022 1016   AST 23 04/28/2022 1016   ALT 13 04/28/2022 1016   BILITOT 0.7 04/28/2022 1016      Encounter Diagnoses  Name Primary?   Anemia due to stage 3a chronic kidney disease Yes   Erythropoietin deficiency anemia    Iron deficiency anemia due to chronic blood loss     Impression and Plan: Brittney Pace is a very pleasant 66 yo female with multifactorial anemia.    Today review of her labs show a Hgb of 10.2 which is greatly improved from 8.2 3 weeks ago. Overall her CMP is stable. Her ferritin is 51, Iron saturation is 18 with total iron of 60.   She would benefit from Aranesp today. In target with her iron today so no additional iron needed at this time.   I would like to plan to get her back to see [place] in another 3 weeks.   Rushie Chestnut, PA-C 4/5/202410:09 AM

## 2022-04-28 NOTE — Patient Instructions (Signed)

## 2022-04-29 ENCOUNTER — Encounter: Payer: Self-pay | Admitting: Family

## 2022-05-17 ENCOUNTER — Other Ambulatory Visit: Payer: Self-pay

## 2022-05-17 ENCOUNTER — Encounter: Payer: Self-pay | Admitting: Medical Oncology

## 2022-05-17 ENCOUNTER — Inpatient Hospital Stay: Payer: Medicare Other

## 2022-05-17 ENCOUNTER — Inpatient Hospital Stay (HOSPITAL_BASED_OUTPATIENT_CLINIC_OR_DEPARTMENT_OTHER): Payer: Medicare Other | Admitting: Medical Oncology

## 2022-05-17 VITALS — BP 160/61 | HR 61 | Temp 97.9°F | Resp 18 | Ht 59.0 in | Wt 110.0 lb

## 2022-05-17 DIAGNOSIS — D631 Anemia in chronic kidney disease: Secondary | ICD-10-CM

## 2022-05-17 DIAGNOSIS — N1831 Chronic kidney disease, stage 3a: Secondary | ICD-10-CM

## 2022-05-17 DIAGNOSIS — D500 Iron deficiency anemia secondary to blood loss (chronic): Secondary | ICD-10-CM | POA: Diagnosis not present

## 2022-05-17 DIAGNOSIS — D460 Refractory anemia without ring sideroblasts, so stated: Secondary | ICD-10-CM | POA: Diagnosis not present

## 2022-05-17 DIAGNOSIS — D638 Anemia in other chronic diseases classified elsewhere: Secondary | ICD-10-CM

## 2022-05-17 DIAGNOSIS — N183 Chronic kidney disease, stage 3 unspecified: Secondary | ICD-10-CM

## 2022-05-17 LAB — CMP (CANCER CENTER ONLY)
ALT: 9 U/L (ref 0–44)
AST: 19 U/L (ref 15–41)
Albumin: 3.6 g/dL (ref 3.5–5.0)
Alkaline Phosphatase: 75 U/L (ref 38–126)
Anion gap: 9 (ref 5–15)
BUN: 53 mg/dL — ABNORMAL HIGH (ref 8–23)
CO2: 26 mmol/L (ref 22–32)
Calcium: 8.7 mg/dL — ABNORMAL LOW (ref 8.9–10.3)
Chloride: 97 mmol/L — ABNORMAL LOW (ref 98–111)
Creatinine: 2.55 mg/dL — ABNORMAL HIGH (ref 0.44–1.00)
GFR, Estimated: 20 mL/min — ABNORMAL LOW (ref >60)
Glucose, Bld: 142 mg/dL — ABNORMAL HIGH (ref 70–99)
Potassium: 4.8 mmol/L (ref 3.5–5.1)
Sodium: 132 mmol/L — ABNORMAL LOW (ref 135–145)
Total Bilirubin: 0.3 mg/dL (ref 0.3–1.2)
Total Protein: 5.8 g/dL — ABNORMAL LOW (ref 6.5–8.1)

## 2022-05-17 LAB — CBC WITH DIFFERENTIAL (CANCER CENTER ONLY)
Abs Immature Granulocytes: 0.02 10*3/uL (ref 0.00–0.07)
Basophils Absolute: 0.1 10*3/uL (ref 0.0–0.1)
Basophils Relative: 2 %
Eosinophils Absolute: 0.8 10*3/uL — ABNORMAL HIGH (ref 0.0–0.5)
Eosinophils Relative: 13 %
HCT: 33.7 % — ABNORMAL LOW (ref 36.0–46.0)
Hemoglobin: 10.7 g/dL — ABNORMAL LOW (ref 12.0–15.0)
Immature Granulocytes: 0 %
Lymphocytes Relative: 18 %
Lymphs Abs: 1.1 10*3/uL (ref 0.7–4.0)
MCH: 27.9 pg (ref 26.0–34.0)
MCHC: 31.8 g/dL (ref 30.0–36.0)
MCV: 88 fL (ref 80.0–100.0)
Monocytes Absolute: 0.5 10*3/uL (ref 0.1–1.0)
Monocytes Relative: 9 %
Neutro Abs: 3.6 10*3/uL (ref 1.7–7.7)
Neutrophils Relative %: 58 %
Platelet Count: 288 10*3/uL (ref 150–400)
RBC: 3.83 MIL/uL — ABNORMAL LOW (ref 3.87–5.11)
RDW: 14.7 % (ref 11.5–15.5)
WBC Count: 6.2 10*3/uL (ref 4.0–10.5)
nRBC: 0 % (ref 0.0–0.2)

## 2022-05-17 MED ORDER — HEPARIN SOD (PORK) LOCK FLUSH 100 UNIT/ML IV SOLN: 250.0000 [IU] | Freq: Once | INTRAVENOUS | Status: DC | PRN

## 2022-05-17 MED ORDER — EPINEPHRINE 0.3 MG/0.3ML IJ SOAJ: 0.3000 mg | Freq: Once | INTRAMUSCULAR | Status: DC | PRN

## 2022-05-17 MED ORDER — HEPARIN SOD (PORK) LOCK FLUSH 100 UNIT/ML IV SOLN: 500.0000 [IU] | Freq: Once | INTRAVENOUS | Status: DC | PRN

## 2022-05-17 MED ORDER — SODIUM CHLORIDE 0.9 % IV SOLN: Freq: Once | INTRAVENOUS | Status: DC | PRN

## 2022-05-17 MED ORDER — METHYLPREDNISOLONE SODIUM SUCC 125 MG IJ SOLR: 125.0000 mg | Freq: Once | INTRAMUSCULAR | Status: DC | PRN

## 2022-05-17 MED ORDER — DIPHENHYDRAMINE HCL 50 MG/ML IJ SOLN: 50.0000 mg | Freq: Once | INTRAMUSCULAR | Status: DC | PRN

## 2022-05-17 MED ORDER — FAMOTIDINE IN NACL 20-0.9 MG/50ML-% IV SOLN: 20.0000 mg | Freq: Once | INTRAVENOUS | Status: DC | PRN

## 2022-05-17 MED ORDER — SODIUM CHLORIDE 0.9% FLUSH: 3.0000 mL | Freq: Once | INTRAVENOUS | Status: DC | PRN

## 2022-05-17 MED ORDER — ALBUTEROL SULFATE (2.5 MG/3ML) 0.083% IN NEBU: 2.5000 mg | INHALATION_SOLUTION | Freq: Once | RESPIRATORY_TRACT | Status: DC | PRN

## 2022-05-17 MED ORDER — ALTEPLASE 2 MG IJ SOLR: 2.0000 mg | Freq: Once | INTRAMUSCULAR | Status: DC | PRN

## 2022-05-17 MED ORDER — SODIUM CHLORIDE 0.9% FLUSH: 10.0000 mL | Freq: Once | INTRAVENOUS | Status: DC | PRN

## 2022-05-17 MED ORDER — DARBEPOETIN ALFA 300 MCG/0.6ML IJ SOSY
300.0000 ug | PREFILLED_SYRINGE | Freq: Once | INTRAMUSCULAR | Status: AC
  Administered 2022-05-17: 300 ug via SUBCUTANEOUS
  Filled 2022-05-17: qty 0.6

## 2022-05-17 NOTE — Progress Notes (Signed)
Hematology and Oncology Follow Up Visit  Brittney Pace [id number] 24-Mar-1955 67 y.o. 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 is here today for follow-up.  She reports that she is fatigue which she thinks is a combo of her hemoglobin but also like stress- they are remodeling her master bathroom among other things.   She has seen [facility]. She reports that this visit went well. They did not make any medication adjustments as her blood pressure was well controlled that day. She is getting on the list for a kidney and pancreatic transplant per their recommendation.  No fevers, night sweats, unintentional weight loss, bleeding episodes or significant bruising episodes.   At her last visit on 04/28/2022 her ferritin was 15 with an iron saturation of 18%.   Currently, I would say that her performance status is probably ECOG 2-3.   Wt Readings from Last 3 Encounters:  05/17/22 110 lb (49.9 kg)  04/28/22 106 lb (48.1 kg)  04/05/22 107 lb (48.5 kg)    Medications:  Allergies as of 05/17/2022       Reactions   Morphine Anaphylaxis, Shortness Of Breath   Penicillins Hives   Tolerated ANCEF on 04/30/20 Has patient had a PCN reaction causing immediate rash, facial/tongue/throat swelling, SOB or lightheadedness with hypotension: Yes Has patient had a PCN reaction causing severe rash involving mucus membranes or skin necrosis: No Has patient had a PCN reaction that required hospitalization No Has patient had a PCN reaction occurring within the last 10 years: No If all of the above answers are "NO", then may proceed with Cephalosporin use.   Tramadol Anaphylaxis   Acetaminophen Other (See Comments)   Alters insulin pump readings   Amitriptyline Other (See Comments)    Severe headache/ out of body feeling   Codeine Other (See Comments)   Severe headaches/ out of body feeling   Ibuprofen Other (See Comments)   Messes up CGM reading on glucose monitor   Krill Oil Diarrhea, Itching, Nausea And Vomiting   Losartan Cough   Propoxyphene Other (See Comments)   Severe headaches / out of body feeling   Shellfish Allergy Diarrhea, Nausea And Vomiting   Statins Other (See Comments)   Muscle pains Other reaction(s): Other   Sulfamethoxazole Other (See Comments)   Mouth ulcers   Sulfites Itching, Other (See Comments)   Mouth ulcers   Fluconazole    Other reaction(s): Contact Dermatitis (intolerance)   Norvasc [amlodipine Besylate] Swelling        Medication List        Accurate as of May 17, 2022  1:22 PM. If you have any questions, ask your nurse or doctor.          Accu-Chek Guide test strip Generic drug: glucose blood 3 (three) times daily.   Alpha-Lipoic Acid 100 MG Caps Take 1 capsule by mouth daily.   AMBULATORY NON FORMULARY MEDICATION Incontinence pads per patient preference, #unlimited, refill x99 Fax to [phone]   Armour Thyroid 60 MG tablet Generic drug: thyroid Take 1 tablet by mouth daily before breakfast.   aspirin EC 325 MG tablet Take 1 tablet (325 mg total) by mouth daily. What changed: when to take this   carvedilol 12.5 MG tablet Commonly known as: COREG TAKE 1  TABLET BY MOUTH TWICE DAILY WITH A MEAL   cetirizine 10 MG tablet Commonly known as: ZYRTEC Take 10 mg by mouth at bedtime.   cholecalciferol 25 MCG (1000 UNIT) tablet Commonly known as: VITAMIN D3 Take 1,000 Units by mouth daily.   Dexcom G6 Sensor Misc USE TO CONTINUOUSLY MONITOR BLOOD SUGARS. CHANGE EVERY 10 DAYS   diphenoxylate-atropine 2.5-0.025 MG tablet Commonly known as: LOMOTIL TAKE 1 TABLET BY MOUTH 4 TIMES DAILY AS NEEDED FOR DIARRHEA OR  LOOSE  STOOLS What changed: See the new instructions.   esomeprazole 20 MG capsule Commonly  known as: NEXIUM Take 20 mg by mouth every morning.   Fluocinolone Acetonide 0.01 % Oil Place 2 Doses in ear(s) daily.   hydrALAZINE 25 MG tablet Commonly known as: APRESOLINE Take 1 tablet (25 mg total) by mouth 3 (three) times daily. What changed:  when to take this additional instructions   isosorbide mononitrate 30 MG 24 hr tablet Commonly known as: IMDUR Take 1 tablet (30 mg total) by mouth daily.   ketoconazole 2 % shampoo Commonly known as: NIZORAL Apply 1 Application topically 2 (two) times a week.   lamoTRIgine 150 MG tablet Commonly known as: LAMICTAL Take 150 mg by mouth at bedtime.   lipase/protease/amylase 12000-38000 units Cpep capsule Commonly known as: CREON Take 12,000-38,000 Units by mouth See admin instructions. Takes 2 tablets with each meals.   Melatonin Gummies 2.5 MG Chew Chew 2.5 mg by mouth at bedtime as needed (sleep).   mirtazapine 30 MG tablet Commonly known as: REMERON Take 30 mg by mouth at bedtime.   NovoLOG 100 UNIT/ML injection Generic drug: insulin aspart Inject 25 Units into the skin continuous.   Premarin vaginal cream Generic drug: conjugated estrogens Place 1 applicator vaginally daily as needed (irritation).   SYSTANE BALANCE OP Apply 1 drop to eye daily as needed (dry eyes).   torsemide 20 MG tablet Commonly known as: [name] Take 1 tablet (20 mg total) by mouth at bedtime as needed (for wt gain or leg swelling).   torsemide 20 MG tablet Commonly known as: [name] Take 1 tablet (20 mg total) by mouth daily with breakfast.   valACYclovir 500 MG tablet Commonly known as: VALTREX Take 500 mg by mouth daily as needed (Flair up).        Allergies:  Allergies  Allergen Reactions   Morphine Anaphylaxis and Shortness Of Breath   Penicillins Hives    Tolerated ANCEF on 04/30/20 Has patient had a PCN reaction causing immediate rash, facial/tongue/throat swelling, SOB or lightheadedness with hypotension: Yes Has patient  had a PCN reaction causing severe rash involving mucus membranes or skin necrosis: No Has patient had a PCN reaction that required hospitalization No Has patient had a PCN reaction occurring within the last 10 years: No If all of the above answers are "NO", then may proceed with Cephalosporin use.   Tramadol Anaphylaxis   Acetaminophen Other (See Comments)    Alters insulin pump readings    Amitriptyline Other (See Comments)    Severe headache/ out of body feeling   Codeine Other (See Comments)    Severe headaches/ out of body feeling    Ibuprofen Other (See Comments)    Messes up CGM reading on glucose monitor     Krill Oil Diarrhea, Itching and Nausea And Vomiting   Losartan Cough   Propoxyphene Other (See Comments)    Severe headaches / out of body feeling    Shellfish Allergy Diarrhea and Nausea And Vomiting  Statins Other (See Comments)    Muscle pains Other reaction(s): Other   Sulfamethoxazole Other (See Comments)    Mouth ulcers    Sulfites Itching and Other (See Comments)    Mouth ulcers   Fluconazole     Other reaction(s): Contact Dermatitis (intolerance)   Norvasc [Amlodipine Besylate] Swelling    Past Medical History, Surgical history, Social history, and Family History were reviewed and updated.  Review of Systems: Review of Systems  Constitutional:  Positive for malaise/fatigue.  HENT: Negative.    Eyes: Negative.   Respiratory: Negative.    Cardiovascular:  Positive for leg swelling.  Gastrointestinal:  Negative for abdominal pain.  Genitourinary: Negative.   Musculoskeletal:  Positive for joint pain and myalgias.  Skin:  Negative for rash.  Neurological: Negative.   Endo/Heme/Allergies: Negative.   Psychiatric/Behavioral: Negative.       Wt Readings from Last 3 Encounters:  05/17/22 110 lb (49.9 kg)  04/28/22 106 lb (48.1 kg)  04/05/22 107 lb (48.5 kg)   Vitals:   05/17/22 1315  BP: (!) 160/61  Pulse: 61  Resp: 18  Temp: 97.9 F (36.6  C)  SpO2: 100%     Physical Exam Vitals reviewed.  HENT:     Head: Normocephalic and atraumatic.  Eyes:     Pupils: Pupils are equal, round, and reactive to light.  Cardiovascular:     Rate and Rhythm: Normal rate and regular rhythm.     Heart sounds: Normal heart sounds.  Pulmonary:     Effort: Pulmonary effort is normal.     Breath sounds: Normal breath sounds.  Abdominal:     General: Bowel sounds are normal.     Palpations: Abdomen is soft.  Musculoskeletal:        General: No tenderness or deformity. Normal range of motion.     Cervical back: Normal range of motion.     Comments: On her extremities, there is some scant edema in her legs.   Lymphadenopathy:     Cervical: No cervical adenopathy.  Skin:    General: Skin is warm and dry.     Findings: No erythema or rash.  Neurological:     Mental Status: She is alert and oriented to person, place, and time.  Psychiatric:        Behavior: Behavior normal.        Thought Content: Thought content normal.        Judgment: Judgment normal.     Lab Results  Component Value Date   WBC 6.2 05/17/2022   HGB 10.7 (L) 05/17/2022   HCT 33.7 (L) 05/17/2022   MCV 88.0 05/17/2022   PLT 288 05/17/2022   Lab Results  Component Value Date   FERRITIN 51 04/28/2022   IRON 60 04/28/2022   TIBC 326 04/28/2022   UIBC 266 04/28/2022   IRONPCTSAT 18 04/28/2022   Lab Results  Component Value Date   RETICCTPCT 1.5 04/28/2022   RBC 3.83 (L) 05/17/2022   No results found for: "KPAFRELGTCHN", "LAMBDASER", "KAPLAMBRATIO" No results found for: "IGGSERUM", "IGA", "IGMSERUM" No results found for: "TOTALPROTELP", "ALBUMINELP", "A1GS", "A2GS", "BETS", "BETA2SER", "GAMS", "MSPIKE", "SPEI"   Chemistry      Component Value Date/Time   NA 129 (L) 04/28/2022 1016   NA 138 07/17/2017 1128   K 4.4 04/28/2022 1016   CL 94 (L) 04/28/2022 1016   CO2 24 04/28/2022 1016   BUN 57 (H) 04/28/2022 1016   BUN 23 07/17/2017 1128   CREATININE  2.38 (H) 04/28/2022 1016   CREATININE 2.45 (H) 03/03/2022 1129      Component Value Date/Time   CALCIUM 8.7 (L) 04/28/2022 1016   ALKPHOS 87 04/28/2022 1016   AST 23 04/28/2022 1016   ALT 13 04/28/2022 1016   BILITOT 0.7 04/28/2022 1016      Encounter Diagnosis  Name Primary?   Anemia due to stage 3a chronic kidney disease Yes    Impression and Plan: Ms. Degrand is a very pleasant 67 yo female with multifactorial anemia.    Today review of her labs show a Hgb of 10.7 which is greatly improved from 8.2 on 04/05/2022. Creatinine waxes ans wanes between 2.3 and 2.5- today it is 2.55. At her 04/28/2022 visit her ferritin was 51, Iron saturation was 18 with total iron of 60.   She would benefit from Aranesp today. In target with her iron today so no additional iron needed at this time.   I would like to plan to get her back to see [place] in another 4 weeks.   Disposition: Aranesp today RTC 4 weeks APP,labs, aranesp   Rushie Chestnut, PA-C 4/23/20241:22 PM

## 2022-05-17 NOTE — Patient Instructions (Signed)

## 2022-05-18 ENCOUNTER — Encounter: Payer: Self-pay | Admitting: Family

## 2022-05-31 ENCOUNTER — Other Ambulatory Visit: Payer: Self-pay | Admitting: Gastroenterology

## 2022-05-31 DIAGNOSIS — K580 Irritable bowel syndrome with diarrhea: Secondary | ICD-10-CM

## 2022-05-31 DIAGNOSIS — K638219 Small intestinal bacterial overgrowth, unspecified: Secondary | ICD-10-CM

## 2022-06-13 ENCOUNTER — Other Ambulatory Visit: Payer: Self-pay | Admitting: Medical Oncology

## 2022-06-13 DIAGNOSIS — D500 Iron deficiency anemia secondary to blood loss (chronic): Secondary | ICD-10-CM

## 2022-06-13 DIAGNOSIS — N1831 Chronic kidney disease, stage 3a: Secondary | ICD-10-CM

## 2022-06-14 ENCOUNTER — Inpatient Hospital Stay: Payer: Medicare Other | Attending: Hematology & Oncology

## 2022-06-14 ENCOUNTER — Inpatient Hospital Stay: Payer: Medicare Other

## 2022-06-14 ENCOUNTER — Ambulatory Visit: Payer: Medicare Other

## 2022-06-14 ENCOUNTER — Ambulatory Visit: Payer: Medicare Other | Admitting: Medical Oncology

## 2022-06-14 ENCOUNTER — Inpatient Hospital Stay (HOSPITAL_BASED_OUTPATIENT_CLINIC_OR_DEPARTMENT_OTHER): Payer: Medicare Other | Admitting: Medical Oncology

## 2022-06-14 ENCOUNTER — Other Ambulatory Visit: Payer: Self-pay | Admitting: Cardiology

## 2022-06-14 ENCOUNTER — Encounter: Payer: Self-pay | Admitting: Medical Oncology

## 2022-06-14 VITALS — BP 181/80 | HR 62 | Temp 97.5°F | Resp 17 | Wt 107.0 lb

## 2022-06-14 DIAGNOSIS — D500 Iron deficiency anemia secondary to blood loss (chronic): Secondary | ICD-10-CM | POA: Diagnosis not present

## 2022-06-14 DIAGNOSIS — N1831 Chronic kidney disease, stage 3a: Secondary | ICD-10-CM | POA: Diagnosis not present

## 2022-06-14 DIAGNOSIS — D460 Refractory anemia without ring sideroblasts, so stated: Secondary | ICD-10-CM | POA: Insufficient documentation

## 2022-06-14 DIAGNOSIS — Z8673 Personal history of transient ischemic attack (TIA), and cerebral infarction without residual deficits: Secondary | ICD-10-CM | POA: Diagnosis not present

## 2022-06-14 DIAGNOSIS — Z794 Long term (current) use of insulin: Secondary | ICD-10-CM | POA: Diagnosis not present

## 2022-06-14 DIAGNOSIS — D631 Anemia in chronic kidney disease: Secondary | ICD-10-CM | POA: Diagnosis not present

## 2022-06-14 DIAGNOSIS — E1122 Type 2 diabetes mellitus with diabetic chronic kidney disease: Secondary | ICD-10-CM | POA: Diagnosis not present

## 2022-06-14 DIAGNOSIS — Z79899 Other long term (current) drug therapy: Secondary | ICD-10-CM | POA: Insufficient documentation

## 2022-06-14 LAB — RETICULOCYTES
Immature Retic Fract: 2.3 % (ref 2.3–15.9)
RBC.: 4.75 MIL/uL (ref 3.87–5.11)
Retic Count, Absolute: 24.2 10*3/uL (ref 19.0–186.0)
Retic Ct Pct: 0.5 % (ref 0.4–3.1)

## 2022-06-14 LAB — CBC WITH DIFFERENTIAL (CANCER CENTER ONLY)
Abs Immature Granulocytes: 0.06 10*3/uL (ref 0.00–0.07)
Basophils Absolute: 0.1 10*3/uL (ref 0.0–0.1)
Basophils Relative: 2 %
Eosinophils Absolute: 0.8 10*3/uL — ABNORMAL HIGH (ref 0.0–0.5)
Eosinophils Relative: 11 %
HCT: 40.4 % (ref 36.0–46.0)
Hemoglobin: 12.6 g/dL (ref 12.0–15.0)
Immature Granulocytes: 1 %
Lymphocytes Relative: 19 %
Lymphs Abs: 1.3 10*3/uL (ref 0.7–4.0)
MCH: 26.6 pg (ref 26.0–34.0)
MCHC: 31.2 g/dL (ref 30.0–36.0)
MCV: 85.2 fL (ref 80.0–100.0)
Monocytes Absolute: 0.6 10*3/uL (ref 0.1–1.0)
Monocytes Relative: 8 %
Neutro Abs: 4.1 10*3/uL (ref 1.7–7.7)
Neutrophils Relative %: 59 %
Platelet Count: 280 10*3/uL (ref 150–400)
RBC: 4.74 MIL/uL (ref 3.87–5.11)
RDW: 14.1 % (ref 11.5–15.5)
WBC Count: 6.9 10*3/uL (ref 4.0–10.5)
nRBC: 0 % (ref 0.0–0.2)

## 2022-06-14 LAB — CMP (CANCER CENTER ONLY)
ALT: 7 U/L (ref 0–44)
AST: 19 U/L (ref 15–41)
Albumin: 3.8 g/dL (ref 3.5–5.0)
Alkaline Phosphatase: 73 U/L (ref 38–126)
Anion gap: 6 (ref 5–15)
BUN: 56 mg/dL — ABNORMAL HIGH (ref 8–23)
CO2: 25 mmol/L (ref 22–32)
Calcium: 9.3 mg/dL (ref 8.9–10.3)
Chloride: 104 mmol/L (ref 98–111)
Creatinine: 2.65 mg/dL — ABNORMAL HIGH (ref 0.44–1.00)
GFR, Estimated: 19 mL/min — ABNORMAL LOW (ref >60)
Glucose, Bld: 172 mg/dL — ABNORMAL HIGH (ref 70–99)
Potassium: 4.8 mmol/L (ref 3.5–5.1)
Sodium: 135 mmol/L (ref 135–145)
Total Bilirubin: 0.3 mg/dL (ref 0.3–1.2)
Total Protein: 6 g/dL — ABNORMAL LOW (ref 6.5–8.1)

## 2022-06-14 LAB — IRON AND IRON BINDING CAPACITY (CC-WL,HP ONLY)
Iron: 86 ug/dL (ref 28–170)
Saturation Ratios: 25 % (ref 10.4–31.8)
TIBC: 351 ug/dL (ref 250–450)
UIBC: 265 ug/dL (ref 148–442)

## 2022-06-14 LAB — FERRITIN: Ferritin: 21 ng/mL (ref 11–307)

## 2022-06-14 NOTE — Progress Notes (Signed)
Hematology and Oncology Follow Up Visit  Brittney Pace [id number] January 19, 1956 67 y.o. 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  Brittney Pace states that overall Brittney Pace has been doing well. Brittney Pace only frustration currently is that Brittney Pace is having a hard time remembering to take Brittney Pace mid day BP medication. Brittney Pace is trying to get into a better habit of doing that and has reached out to Brittney Pace nephrologist to see if Brittney Pace can be switched onto a medication that does not need mid day dosing.   Brittney Pace also mentions trying to find a new PCP; Brittney Pace is concerned about Brittney Pace bone strength (last DEXA showed a T-score of -3.5). Brittney Pace is currently taking OTC vitamin D and eats yogurt once daily.   No fevers, night sweats, unintentional weight loss, bleeding episodes or significant bruising episodes.   Currently, I would say that Brittney Pace performance status is probably ECOG 2-3.   Wt Readings from Last 3 Encounters:  06/14/22 107 lb (48.5 kg)  05/17/22 110 lb (49.9 kg)  04/28/22 106 lb (48.1 kg)    Medications:  Allergies as of 06/14/2022       Reactions   Morphine Anaphylaxis, Shortness Of Breath   Penicillins Hives   Tolerated ANCEF on 04/30/20 Has patient had a PCN reaction causing immediate rash, facial/tongue/throat swelling, SOB or lightheadedness with hypotension: Yes Has patient had a PCN reaction causing severe rash involving mucus membranes or skin necrosis: No Has patient had a PCN reaction that required hospitalization No Has patient had a PCN reaction occurring within the last 10 years: No If all of the above answers are "NO", then may proceed with Cephalosporin use.   Tramadol Anaphylaxis   Acetaminophen Other (See Comments)   Alters insulin pump readings   Amitriptyline Other (See  Comments)   Severe headache/ out of body feeling   Codeine Other (See Comments)   Severe headaches/ out of body feeling   Ibuprofen Other (See Comments)   Messes up CGM reading on glucose monitor   Krill Oil Diarrhea, Itching, Nausea And Vomiting   Losartan Cough   Propoxyphene Other (See Comments)   Severe headaches / out of body feeling   Shellfish Allergy Diarrhea, Nausea And Vomiting   Statins Other (See Comments)   Muscle pains Other reaction(s): Other   Sulfamethoxazole Other (See Comments)   Mouth ulcers   Sulfites Itching, Other (See Comments)   Mouth ulcers   Fluconazole    Other reaction(s): Contact Dermatitis (intolerance)   Norvasc [amlodipine Besylate] Swelling        Medication List        Accurate as of Jun 14, 2022  1:34 PM. If you have any questions, ask your nurse or doctor.          Accu-Chek Guide test strip Generic drug: glucose blood 3 (three) times daily.   Alpha-Lipoic Acid 100 MG Caps Take 1 capsule by mouth daily.   AMBULATORY NON FORMULARY MEDICATION Incontinence pads per patient preference, #unlimited, refill x99 Fax to [phone]   Armour Thyroid 60 MG tablet Generic drug: thyroid Take 1 tablet by mouth daily before breakfast.   aspirin EC 325 MG tablet Take 1 tablet (325 mg total) by mouth daily. What changed: when to take this  carvedilol 12.5 MG tablet Commonly known as: COREG TAKE 1 TABLET BY MOUTH TWICE DAILY WITH A MEAL   cetirizine 10 MG tablet Commonly known as: ZYRTEC Take 10 mg by mouth at bedtime.   cholecalciferol 25 MCG (1000 UNIT) tablet Commonly known as: VITAMIN D3 Take 1,000 Units by mouth daily.   Dexcom G6 Sensor Misc USE TO CONTINUOUSLY MONITOR BLOOD SUGARS. CHANGE EVERY 10 DAYS   diphenoxylate-atropine 2.5-0.025 MG tablet Commonly known as: LOMOTIL TAKE 1 TABLET BY MOUTH 4 TIMES DAILY AS NEEDED FOR DIARRHEA OR  LOOSE  STOOLS What changed: See the new instructions.   esomeprazole 20 MG  capsule Commonly known as: NEXIUM Take 20 mg by mouth every morning.   Fluocinolone Acetonide 0.01 % Oil Place 2 Doses in ear(s) daily.   hydrALAZINE 25 MG tablet Commonly known as: APRESOLINE Take 1 tablet (25 mg total) by mouth 3 (three) times daily.   isosorbide mononitrate 30 MG 24 hr tablet Commonly known as: IMDUR Take 1 tablet (30 mg total) by mouth daily.   ketoconazole 2 % shampoo Commonly known as: NIZORAL Apply 1 Application topically 2 (two) times a week.   lamoTRIgine 150 MG tablet Commonly known as: LAMICTAL Take 150 mg by mouth at bedtime.   lipase/protease/amylase 12000-38000 units Cpep capsule Commonly known as: CREON Take 12,000-38,000 Units by mouth See admin instructions. Takes 2 tablets with each meals.   Melatonin Gummies 2.5 MG Chew Chew 2.5 mg by mouth at bedtime as needed (sleep).   mirtazapine 30 MG tablet Commonly known as: REMERON Take 30 mg by mouth at bedtime.   NovoLOG 100 UNIT/ML injection Generic drug: insulin aspart Inject 25 Units into the skin continuous.   Premarin vaginal cream Generic drug: conjugated estrogens Place 1 applicator vaginally daily as needed (irritation).   SYSTANE BALANCE OP Apply 1 drop to eye daily as needed (dry eyes).   torsemide 20 MG tablet Commonly known as: [name] Take 1 tablet (20 mg total) by mouth at bedtime as needed (for wt gain or leg swelling).   torsemide 20 MG tablet Commonly known as: [name] Take 1 tablet (20 mg total) by mouth daily with breakfast.   valACYclovir 500 MG tablet Commonly known as: VALTREX Take 500 mg by mouth daily as needed (Flair up).        Allergies:  Allergies  Allergen Reactions   Morphine Anaphylaxis and Shortness Of Breath   Penicillins Hives    Tolerated ANCEF on 04/30/20 Has patient had a PCN reaction causing immediate rash, facial/tongue/throat swelling, SOB or lightheadedness with hypotension: Yes Has patient had a PCN reaction causing severe rash  involving mucus membranes or skin necrosis: No Has patient had a PCN reaction that required hospitalization No Has patient had a PCN reaction occurring within the last 10 years: No If all of the above answers are "NO", then may proceed with Cephalosporin use.   Tramadol Anaphylaxis   Acetaminophen Other (See Comments)    Alters insulin pump readings    Amitriptyline Other (See Comments)    Severe headache/ out of body feeling   Codeine Other (See Comments)    Severe headaches/ out of body feeling    Ibuprofen Other (See Comments)    Messes up CGM reading on glucose monitor     Krill Oil Diarrhea, Itching and Nausea And Vomiting   Losartan Cough   Propoxyphene Other (See Comments)    Severe headaches / out of body feeling    Shellfish Allergy Diarrhea and Nausea And  Vomiting   Statins Other (See Comments)    Muscle pains Other reaction(s): Other   Sulfamethoxazole Other (See Comments)    Mouth ulcers    Sulfites Itching and Other (See Comments)    Mouth ulcers   Fluconazole     Other reaction(s): Contact Dermatitis (intolerance)   Norvasc [Amlodipine Besylate] Swelling    Past Medical History, Surgical history, Social history, and Family History were reviewed and updated.  Review of Systems: Review of Systems  Constitutional:  Positive for malaise/fatigue.  HENT: Negative.    Eyes: Negative.   Respiratory: Negative.    Cardiovascular:  Positive for leg swelling.  Gastrointestinal:  Negative for abdominal pain.  Genitourinary: Negative.   Musculoskeletal:  Positive for joint pain and myalgias.  Skin:  Negative for rash.  Neurological: Negative.   Endo/Heme/Allergies: Negative.   Psychiatric/Behavioral: Negative.       Wt Readings from Last 3 Encounters:  06/14/22 107 lb (48.5 kg)  05/17/22 110 lb (49.9 kg)  04/28/22 106 lb (48.1 kg)   Vitals:   06/14/22 1258 06/14/22 1305  BP: (!) 176/102 (!) 181/80  Pulse: 62   Resp: 17   Temp: (!) 97.5 F (36.4 C)    SpO2: 100%      Physical Exam Vitals reviewed.  HENT:     Head: Normocephalic and atraumatic.  Eyes:     Pupils: Pupils are equal, round, and reactive to light.  Cardiovascular:     Rate and Rhythm: Normal rate and regular rhythm.     Heart sounds: Normal heart sounds.  Pulmonary:     Effort: Pulmonary effort is normal.     Breath sounds: Normal breath sounds.  Abdominal:     General: Bowel sounds are normal.     Palpations: Abdomen is soft.  Musculoskeletal:        General: No tenderness or deformity. Normal range of motion.     Cervical back: Normal range of motion.  Lymphadenopathy:     Cervical: No cervical adenopathy.  Skin:    General: Skin is warm and dry.     Findings: No erythema or rash.  Neurological:     Mental Status: Brittney Pace is alert and oriented to person, place, and time.  Psychiatric:        Behavior: Behavior normal.        Thought Content: Thought content normal.        Judgment: Judgment normal.     Lab Results  Component Value Date   WBC 6.9 06/14/2022   HGB 12.6 06/14/2022   HCT 40.4 06/14/2022   MCV 85.2 06/14/2022   PLT 280 06/14/2022   Lab Results  Component Value Date   FERRITIN 51 04/28/2022   IRON 60 04/28/2022   TIBC 326 04/28/2022   UIBC 266 04/28/2022   IRONPCTSAT 18 04/28/2022   Lab Results  Component Value Date   RETICCTPCT 0.5 06/14/2022   RBC 4.75 06/14/2022   No results found for: "KPAFRELGTCHN", "LAMBDASER", "KAPLAMBRATIO" No results found for: "IGGSERUM", "IGA", "IGMSERUM" No results found for: "TOTALPROTELP", "ALBUMINELP", "A1GS", "A2GS", "BETS", "BETA2SER", "GAMS", "MSPIKE", "SPEI"   Chemistry      Component Value Date/Time   NA 135 06/14/2022 1231   NA 138 07/17/2017 1128   K 4.8 06/14/2022 1231   CL 104 06/14/2022 1231   CO2 25 06/14/2022 1231   BUN 56 (H) 06/14/2022 1231   BUN 23 07/17/2017 1128   CREATININE 2.65 (H) 06/14/2022 1231   CREATININE 2.45 (H) 03/03/2022 1129  Component Value Date/Time    CALCIUM 9.3 06/14/2022 1231   ALKPHOS 73 06/14/2022 1231   AST 19 06/14/2022 1231   ALT 7 06/14/2022 1231   BILITOT 0.3 06/14/2022 1231     No diagnosis found.  Impression and Plan: Brittney Pace is a very pleasant 67 yo female with multifactorial anemia.    Today review of Brittney Pace labs show a Hgb of 12.6 which is greatly improved from 7.9 on 03/28/2022. Creatinine waxes and wanes between 2.3 and 2.5- today it is 2.65. At Brittney Pace 04/28/2022 visit Brittney Pace ferritin was 51, Iron saturation was 18 with total iron of 60.   We are holding off of Aranesp today given Brittney Pace Hgb and BP. I have encouraged Brittney Pace to go home and take Brittney Pace mid day BP medication. Brittney Pace will work closely with Brittney Pace nephrologist to try to obtain better control of Brittney Pace BP. Brittney Pace reports that this is a chronic problem and not secondary to the Aranesp use.    Disposition: No Aranesp today RTC 4 weeks APP,labs(CBC, CMP, iron, ferritin), aranesp   Rushie Chestnut, PA-C 5/21/20241:34 PM

## 2022-06-14 NOTE — Patient Instructions (Signed)
Ask your nephrologist about a B vitamin to help with any neuropathy Also ask about high dose vitamin D (prescription 50,000 IU once daily and calcium)

## 2022-06-17 ENCOUNTER — Telehealth: Payer: Self-pay

## 2022-06-17 ENCOUNTER — Other Ambulatory Visit: Payer: Self-pay | Admitting: Medical Oncology

## 2022-06-17 NOTE — Telephone Encounter (Signed)
Advised via MyChart.

## 2022-06-17 NOTE — Telephone Encounter (Signed)
-----   Message from Rushie Chestnut, [place] sent at 06/17/2022  3:29 PM EDT ----- Can we ask her if she would like IV iron therapy- her iron storage levels are a bit low. If she does I have placed the orders we just need Bjorn Loser to schedule.  ----- Message ----- From: Leory Plowman, Lab In [place] Sent: 06/14/2022  12:46 PM EDT To: Rushie Chestnut, PA-C

## 2022-07-07 ENCOUNTER — Inpatient Hospital Stay: Payer: Medicare Other

## 2022-07-07 ENCOUNTER — Other Ambulatory Visit: Payer: Self-pay

## 2022-07-07 ENCOUNTER — Encounter: Payer: Self-pay | Admitting: Medical Oncology

## 2022-07-07 ENCOUNTER — Inpatient Hospital Stay (HOSPITAL_BASED_OUTPATIENT_CLINIC_OR_DEPARTMENT_OTHER): Payer: Medicare Other | Admitting: Medical Oncology

## 2022-07-07 ENCOUNTER — Inpatient Hospital Stay: Payer: Medicare Other | Attending: Hematology & Oncology

## 2022-07-07 VITALS — BP 154/69 | HR 61 | Temp 97.3°F | Resp 18 | Ht 59.0 in | Wt 106.0 lb

## 2022-07-07 DIAGNOSIS — D460 Refractory anemia without ring sideroblasts, so stated: Secondary | ICD-10-CM | POA: Insufficient documentation

## 2022-07-07 DIAGNOSIS — Z8673 Personal history of transient ischemic attack (TIA), and cerebral infarction without residual deficits: Secondary | ICD-10-CM | POA: Insufficient documentation

## 2022-07-07 DIAGNOSIS — Z79899 Other long term (current) drug therapy: Secondary | ICD-10-CM | POA: Insufficient documentation

## 2022-07-07 DIAGNOSIS — D631 Anemia in chronic kidney disease: Secondary | ICD-10-CM | POA: Diagnosis not present

## 2022-07-07 DIAGNOSIS — R300 Dysuria: Secondary | ICD-10-CM

## 2022-07-07 DIAGNOSIS — N1831 Chronic kidney disease, stage 3a: Secondary | ICD-10-CM

## 2022-07-07 DIAGNOSIS — Z794 Long term (current) use of insulin: Secondary | ICD-10-CM | POA: Diagnosis not present

## 2022-07-07 DIAGNOSIS — E1122 Type 2 diabetes mellitus with diabetic chronic kidney disease: Secondary | ICD-10-CM | POA: Diagnosis not present

## 2022-07-07 LAB — CBC WITH DIFFERENTIAL (CANCER CENTER ONLY)
Abs Immature Granulocytes: 0.02 10*3/uL (ref 0.00–0.07)
Basophils Absolute: 0.1 10*3/uL (ref 0.0–0.1)
Basophils Relative: 1 %
Eosinophils Absolute: 0.6 10*3/uL — ABNORMAL HIGH (ref 0.0–0.5)
Eosinophils Relative: 8 %
HCT: 37.4 % (ref 36.0–46.0)
Hemoglobin: 12.1 g/dL (ref 12.0–15.0)
Immature Granulocytes: 0 %
Lymphocytes Relative: 14 %
Lymphs Abs: 1.1 10*3/uL (ref 0.7–4.0)
MCH: 26.7 pg (ref 26.0–34.0)
MCHC: 32.4 g/dL (ref 30.0–36.0)
MCV: 82.6 fL (ref 80.0–100.0)
Monocytes Absolute: 0.6 10*3/uL (ref 0.1–1.0)
Monocytes Relative: 8 %
Neutro Abs: 5.6 10*3/uL (ref 1.7–7.7)
Neutrophils Relative %: 69 %
Platelet Count: 258 10*3/uL (ref 150–400)
RBC: 4.53 MIL/uL (ref 3.87–5.11)
RDW: 14.5 % (ref 11.5–15.5)
WBC Count: 8 10*3/uL (ref 4.0–10.5)
nRBC: 0 % (ref 0.0–0.2)

## 2022-07-07 LAB — URINALYSIS, COMPLETE (UACMP) WITH MICROSCOPIC
Bilirubin Urine: NEGATIVE
Glucose, UA: 100 mg/dL — AB
Hgb urine dipstick: NEGATIVE
Ketones, ur: NEGATIVE mg/dL
Nitrite: NEGATIVE
Protein, ur: >=300 mg/dL — AB
Specific Gravity, Urine: 1.015 (ref 1.005–1.030)
pH: 6.5 (ref 5.0–8.0)

## 2022-07-07 LAB — CMP (CANCER CENTER ONLY)
ALT: 13 U/L (ref 0–44)
AST: 22 U/L (ref 15–41)
Albumin: 3.7 g/dL (ref 3.5–5.0)
Alkaline Phosphatase: 68 U/L (ref 38–126)
Anion gap: 9 (ref 5–15)
BUN: 50 mg/dL — ABNORMAL HIGH (ref 8–23)
CO2: 27 mmol/L (ref 22–32)
Calcium: 8.8 mg/dL — ABNORMAL LOW (ref 8.9–10.3)
Chloride: 98 mmol/L (ref 98–111)
Creatinine: 2.43 mg/dL — ABNORMAL HIGH (ref 0.44–1.00)
GFR, Estimated: 21 mL/min — ABNORMAL LOW (ref >60)
Glucose, Bld: 172 mg/dL — ABNORMAL HIGH (ref 70–99)
Potassium: 4.1 mmol/L (ref 3.5–5.1)
Sodium: 134 mmol/L — ABNORMAL LOW (ref 135–145)
Total Bilirubin: 0.3 mg/dL (ref 0.3–1.2)
Total Protein: 6.1 g/dL — ABNORMAL LOW (ref 6.5–8.1)

## 2022-07-07 LAB — FERRITIN: Ferritin: 68 ng/mL (ref 11–307)

## 2022-07-07 LAB — IRON AND IRON BINDING CAPACITY (CC-WL,HP ONLY)
Iron: 75 ug/dL (ref 28–170)
Saturation Ratios: 28 % (ref 10.4–31.8)
TIBC: 272 ug/dL (ref 250–450)
UIBC: 197 ug/dL (ref 148–442)

## 2022-07-07 NOTE — Progress Notes (Signed)
Hematology and Oncology Follow Up Visit  Brittney Pace [id number] 01/17/1956 67 y.o. 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   She states that overall she is doing ok. Feels like she may have a UTI starting over the past few days. Having some dysuria and urinary frequency. No abdominal pain, fevers, vomiting, back pain. Has not tried anything for symptoms. No recent UTI.   No fevers, night sweats, unintentional weight loss, bleeding episodes or significant bruising episodes.   Currently, I would say that her performance status is probably ECOG 2-3.   Wt Readings from Last 3 Encounters:  07/07/22 106 lb 0.6 oz (48.1 kg)  06/14/22 107 lb (48.5 kg)  05/17/22 110 lb (49.9 kg)    Medications:  Allergies as of 07/07/2022       Reactions   Morphine Anaphylaxis, Shortness Of Breath   Penicillins Hives   Tolerated ANCEF on 04/30/20 Has patient had a PCN reaction causing immediate rash, facial/tongue/throat swelling, SOB or lightheadedness with hypotension: Yes Has patient had a PCN reaction causing severe rash involving mucus membranes or skin necrosis: No Has patient had a PCN reaction that required hospitalization No Has patient had a PCN reaction occurring within the last 10 years: No If all of the above answers are "NO", then may proceed with Cephalosporin use.   Tramadol Anaphylaxis   Acetaminophen Other (See Comments)   Alters insulin pump readings   Amitriptyline Other (See Comments)   Severe headache/ out of body feeling   Codeine Other (See Comments)   Severe headaches/ out of body feeling   Ibuprofen Other (See Comments)   Messes up CGM reading on glucose monitor   Krill Oil Diarrhea, Itching, Nausea And Vomiting   Losartan Cough    Propoxyphene Other (See Comments)   Severe headaches / out of body feeling   Shellfish Allergy Diarrhea, Nausea And Vomiting   Statins Other (See Comments)   Muscle pains Other reaction(s): Other   Sulfamethoxazole Other (See Comments)   Mouth ulcers   Sulfites Itching, Other (See Comments)   Mouth ulcers   Fluconazole    Other reaction(s): Contact Dermatitis (intolerance)   Norvasc [amlodipine Besylate] Swelling        Medication List        Accurate as of July 07, 2022  2:35 PM. If you have any questions, ask your nurse or doctor.          STOP taking these medications    [facility] Stopped by: Rushie Chestnut, PA-C       TAKE these medications    Accu-Chek Guide test strip Generic drug: glucose blood 3 (three) times daily.   Alpha-Lipoic Acid 100 MG Caps Take 1 capsule by mouth daily.   Armour Thyroid 60 MG tablet Generic drug: thyroid Take 1 tablet by mouth daily before breakfast.   aspirin EC 325 MG tablet Take 1 tablet (325 mg total) by mouth daily. What changed: when to take this   carvedilol 12.5 MG tablet Commonly known as: COREG TAKE 1 TABLET BY MOUTH TWICE DAILY WITH A MEAL   cetirizine 10 MG tablet Commonly known as: ZYRTEC Take 10 mg by mouth at bedtime.   Dexcom G6 Sensor Misc USE TO CONTINUOUSLY  MONITOR BLOOD SUGARS. CHANGE EVERY 10 DAYS   diphenoxylate-atropine 2.5-0.025 MG tablet Commonly known as: LOMOTIL TAKE 1 TABLET BY MOUTH 4 TIMES DAILY AS NEEDED FOR DIARRHEA OR  LOOSE  STOOLS What changed: See the new instructions.   esomeprazole 20 MG capsule Commonly known as: NEXIUM Take 20 mg by mouth every morning.   Fluocinolone Acetonide 0.01 % Oil Place 2 Doses in ear(s) daily.   hydrALAZINE 25 MG tablet Commonly known as: APRESOLINE TAKE 1 TABLET BY MOUTH THREE TIMES DAILY   isosorbide mononitrate 60 MG 24 hr tablet Commonly known as: IMDUR Take 60 mg by mouth daily. What changed: Another medication with the  same name was removed. Continue taking this medication, and follow the directions you see here. Changed by: Rushie Chestnut, PA-C   ketoconazole 2 % shampoo Commonly known as: NIZORAL Apply 1 Application topically 2 (two) times a week.   lamoTRIgine 150 MG tablet Commonly known as: LAMICTAL Take 150 mg by mouth at bedtime.   Melatonin Gummies 2.5 MG Chew Chew 2.5 mg by mouth at bedtime as needed (sleep).   mirtazapine 30 MG tablet Commonly known as: REMERON Take 30 mg by mouth at bedtime.   NovoLOG 100 UNIT/ML injection Generic drug: insulin aspart Inject 25 Units into the skin continuous.   Premarin vaginal cream Generic drug: conjugated estrogens Place 1 applicator vaginally daily as needed (irritation).   torsemide 20 MG tablet Commonly known as: [name] Take 1 tablet (20 mg total) by mouth at bedtime as needed (for wt gain or leg swelling).   torsemide 20 MG tablet Commonly known as: [name] Take 1 tablet (20 mg total) by mouth daily with breakfast.   valACYclovir 500 MG tablet Commonly known as: VALTREX Take 500 mg by mouth daily as needed (Flair up).        Allergies:  Allergies  Allergen Reactions   Morphine Anaphylaxis and Shortness Of Breath   Penicillins Hives    Tolerated ANCEF on 04/30/20 Has patient had a PCN reaction causing immediate rash, facial/tongue/throat swelling, SOB or lightheadedness with hypotension: Yes Has patient had a PCN reaction causing severe rash involving mucus membranes or skin necrosis: No Has patient had a PCN reaction that required hospitalization No Has patient had a PCN reaction occurring within the last 10 years: No If all of the above answers are "NO", then may proceed with Cephalosporin use.   Tramadol Anaphylaxis   Acetaminophen Other (See Comments)    Alters insulin pump readings    Amitriptyline Other (See Comments)    Severe headache/ out of body feeling   Codeine Other (See Comments)    Severe headaches/ out  of body feeling    Ibuprofen Other (See Comments)    Messes up CGM reading on glucose monitor     Krill Oil Diarrhea, Itching and Nausea And Vomiting   Losartan Cough   Propoxyphene Other (See Comments)    Severe headaches / out of body feeling    Shellfish Allergy Diarrhea and Nausea And Vomiting   Statins Other (See Comments)    Muscle pains Other reaction(s): Other   Sulfamethoxazole Other (See Comments)    Mouth ulcers    Sulfites Itching and Other (See Comments)    Mouth ulcers   Fluconazole     Other reaction(s): Contact Dermatitis (intolerance)   Norvasc [Amlodipine Besylate] Swelling    Past Medical History, Surgical history, Social history, and Family History were reviewed and updated.  Review of Systems: Review of Systems  Constitutional:  Negative for chills, fever and malaise/fatigue.  HENT: Negative.    Eyes: Negative.   Respiratory: Negative.    Cardiovascular:  Negative for leg swelling.  Gastrointestinal:  Negative for abdominal pain.  Genitourinary:  Positive for dysuria, frequency and urgency. Negative for flank pain and hematuria.  Musculoskeletal:  Positive for joint pain and myalgias.  Skin:  Negative for rash.  Neurological: Negative.   Endo/Heme/Allergies: Negative.   Psychiatric/Behavioral: Negative.     Wt Readings from Last 3 Encounters:  07/07/22 106 lb 0.6 oz (48.1 kg)  06/14/22 107 lb (48.5 kg)  05/17/22 110 lb (49.9 kg)   Vitals:   07/07/22 1216  BP: (!) 154/69  Pulse: 61  Resp: 18  Temp: (!) 97.3 F (36.3 C)  SpO2: 100%     Physical Exam Vitals reviewed.  HENT:     Head: Normocephalic and atraumatic.  Eyes:     Pupils: Pupils are equal, round, and reactive to light.  Cardiovascular:     Rate and Rhythm: Normal rate and regular rhythm.     Heart sounds: Normal heart sounds.  Pulmonary:     Effort: Pulmonary effort is normal.     Breath sounds: Normal breath sounds.  Abdominal:     General: Bowel sounds are normal.      Palpations: Abdomen is soft.     Comments: No CVA tenderness  Musculoskeletal:        General: No tenderness or deformity. Normal range of motion.     Cervical back: Normal range of motion.  Lymphadenopathy:     Cervical: No cervical adenopathy.  Skin:    General: Skin is warm and dry.     Findings: No erythema or rash.  Neurological:     Mental Status: She is alert and oriented to person, place, and time.  Psychiatric:        Behavior: Behavior normal.        Thought Content: Thought content normal.        Judgment: Judgment normal.     Lab Results  Component Value Date   WBC 8.0 07/07/2022   HGB 12.1 07/07/2022   HCT 37.4 07/07/2022   MCV 82.6 07/07/2022   PLT 258 07/07/2022   Lab Results  Component Value Date   FERRITIN 21 06/14/2022   IRON 86 06/14/2022   TIBC 351 06/14/2022   UIBC 265 06/14/2022   IRONPCTSAT 25 06/14/2022   Lab Results  Component Value Date   RETICCTPCT 0.5 06/14/2022   RBC 4.53 07/07/2022   No results found for: "KPAFRELGTCHN", "LAMBDASER", "KAPLAMBRATIO" No results found for: "IGGSERUM", "IGA", "IGMSERUM" No results found for: "TOTALPROTELP", "ALBUMINELP", "A1GS", "A2GS", "BETS", "BETA2SER", "GAMS", "MSPIKE", "SPEI"   Chemistry      Component Value Date/Time   NA 134 (L) 07/07/2022 1119   NA 138 07/17/2017 1128   K 4.1 07/07/2022 1119   CL 98 07/07/2022 1119   CO2 27 07/07/2022 1119   BUN 50 (H) 07/07/2022 1119   BUN 23 07/17/2017 1128   CREATININE 2.43 (H) 07/07/2022 1119   CREATININE 2.45 (H) 03/03/2022 1129      Component Value Date/Time   CALCIUM 8.8 (L) 07/07/2022 1119   ALKPHOS 68 07/07/2022 1119   AST 22 07/07/2022 1119   ALT 13 07/07/2022 1119   BILITOT 0.3 07/07/2022 1119     Encounter Diagnoses  Name Primary?   Anemia due to stage 3a chronic kidney disease (HCC) Yes   Dysuria     Impression  and Plan: Ms. Hennesy is a very pleasant 67 yo female with multifactorial anemia.    Today review of her labs show a Hgb  of 12.1 which is greatly improved from 7.9 on 03/28/2022. Creatinine waxes and wanes between 2.3 and 2.5- today it is 2.43. At her 04/28/2022 visit her ferritin was 51, Iron saturation was 18 with total iron of 60.   We are holding off of Aranesp today given her Hgb and BP. UA/Culture pending. Review of her last urine culture shows no growth.   Disposition: No Aranesp today Urine culture pending  RTC 4 weeks APP,labs(CBC, CMP, iron, ferritin), aranesp (we will stay at 1 month follow ups per patient request)  Rushie Chestnut, PA-C 6/13/20242:35 PM

## 2022-07-08 ENCOUNTER — Encounter: Payer: Self-pay | Admitting: *Deleted

## 2022-07-08 ENCOUNTER — Other Ambulatory Visit: Payer: Self-pay | Admitting: Medical Oncology

## 2022-07-08 LAB — URINE CULTURE

## 2022-07-08 MED ORDER — CIPROFLOXACIN HCL 500 MG PO TABS: 500.0000 mg | ORAL_TABLET | Freq: Every day | ORAL | 0 refills | Status: AC

## 2022-07-09 LAB — URINE CULTURE: Culture: >=100000 — AB

## 2022-07-11 ENCOUNTER — Other Ambulatory Visit: Payer: Self-pay | Admitting: Medical Oncology

## 2022-07-11 MED ORDER — FOSFOMYCIN TROMETHAMINE 3 G PO PACK: PACK | ORAL | 0 refills | Status: DC

## 2022-07-12 ENCOUNTER — Ambulatory Visit: Payer: Medicare Other

## 2022-07-12 ENCOUNTER — Ambulatory Visit: Payer: Medicare Other | Admitting: Medical Oncology

## 2022-07-12 ENCOUNTER — Inpatient Hospital Stay: Payer: Medicare Other

## 2022-07-14 DIAGNOSIS — M79671 Pain in right foot: Secondary | ICD-10-CM | POA: Insufficient documentation

## 2022-07-18 ENCOUNTER — Telehealth: Payer: Self-pay

## 2022-07-18 NOTE — Telephone Encounter (Signed)
Received call from patient inquiring about labs drawn for an outside facility ([facility]). Pt educated that the labs were drawn and sent to [facility] and [facility] will send the results to the ordering physician office. Pt aware that labs may take 2 weeks to result. Pt verbalized understanding and had no further questions.

## 2022-07-27 DIAGNOSIS — M7741 Metatarsalgia, right foot: Secondary | ICD-10-CM | POA: Insufficient documentation

## 2022-08-04 ENCOUNTER — Inpatient Hospital Stay: Payer: Medicare Other

## 2022-08-04 ENCOUNTER — Inpatient Hospital Stay: Payer: Medicare Other | Admitting: Medical Oncology

## 2022-08-04 DIAGNOSIS — Z8619 Personal history of other infectious and parasitic diseases: Secondary | ICD-10-CM | POA: Insufficient documentation

## 2022-08-08 ENCOUNTER — Inpatient Hospital Stay: Payer: Medicare Other | Admitting: Medical Oncology

## 2022-08-08 ENCOUNTER — Inpatient Hospital Stay: Payer: Medicare Other

## 2022-08-08 ENCOUNTER — Other Ambulatory Visit: Payer: Self-pay | Admitting: Medical Oncology

## 2022-08-08 DIAGNOSIS — D500 Iron deficiency anemia secondary to blood loss (chronic): Secondary | ICD-10-CM

## 2022-08-08 DIAGNOSIS — N1831 Chronic kidney disease, stage 3a: Secondary | ICD-10-CM

## 2022-08-13 ENCOUNTER — Other Ambulatory Visit: Payer: Self-pay

## 2022-08-13 ENCOUNTER — Emergency Department (HOSPITAL_COMMUNITY)
Admission: EM | Admit: 2022-08-13 | Discharge: 2022-08-13 | Disposition: A | Discharge status: Home / Self Care | Payer: Medicare Other

## 2022-08-13 ENCOUNTER — Emergency Department (HOSPITAL_COMMUNITY): Payer: Medicare Other

## 2022-08-13 DIAGNOSIS — Z7982 Long term (current) use of aspirin: Secondary | ICD-10-CM | POA: Diagnosis not present

## 2022-08-13 DIAGNOSIS — X501XXA Overexertion from prolonged static or awkward postures, initial encounter: Secondary | ICD-10-CM | POA: Insufficient documentation

## 2022-08-13 DIAGNOSIS — M25562 Pain in left knee: Secondary | ICD-10-CM | POA: Diagnosis present

## 2022-08-13 MED ORDER — OXYCODONE HCL 5 MG PO TABS: 5.0000 mg | ORAL_TABLET | Freq: Once | ORAL | Status: DC

## 2022-08-13 NOTE — ED Notes (Signed)
Pt reports holding off on any pain meds at this time. Pt able to stand and pivot into wheelchair for toileting.

## 2022-08-13 NOTE — ED Notes (Signed)
ACE wrap applied to L knee.  Patient is able to return demonstrate use of the walker

## 2022-08-13 NOTE — ED Provider Notes (Signed)
[place] EMERGENCY DEPARTMENT AT [facility] Provider Note   CSN: [id number] Arrival date & time: 08/13/22  1425     History {Add pertinent medical, surgical, social history, OB history to HPI:1} Chief Complaint  Patient presents with   Knee Injury    Brittney Pace is a 67 y.o. female.  Left knee pain after twisting the knee last night.  Reportedly got tripped over her dog.  She states that she more so twisted her left leg, and landed onto chair.  Did not fall or hit her head.  No other injuries.  Reports pain to the left knee on the medial aspect.  No numbness tingling changes in sensation.  Has not taking any pain medications prior to arrival.        Home Medications Prior to Admission medications   Medication Sig Start Date End Date Taking? Authorizing Provider  fosfomycin (MONUROL) 3 g PACK Divide packet into 4 equal doses. Take 1/4th packet by mouth with liquid twice daily for 1 day. You will have 1/2 packet left over that you will not take. 07/11/22   Rushie Chestnut, PA-C  ACCU-CHEK GUIDE test strip 3 (three) times daily. 05/03/21   [provider]  Alpha-Lipoic Acid 100 MG CAPS Take 1 capsule by mouth daily. 05/17/22   [provider]  aspirin EC 325 MG tablet Take 1 tablet (325 mg total) by mouth daily. Patient taking differently: Take 325 mg by mouth at bedtime. 03/06/19   Lewayne Bunting, MD  carvedilol (COREG) 12.5 MG tablet TAKE 1 TABLET BY MOUTH TWICE DAILY WITH A MEAL 04/26/22   Lewayne Bunting, MD  cetirizine (ZYRTEC) 10 MG tablet Take 10 mg by mouth at bedtime. 03/26/21   [provider]  Continuous Blood Gluc Sensor (DEXCOM G6 SENSOR) MISC USE TO CONTINUOUSLY MONITOR BLOOD SUGARS. CHANGE EVERY 10 DAYS 04/02/19   [provider]  diphenoxylate-atropine (LOMOTIL) 2.5-0.025 MG tablet TAKE 1 TABLET BY MOUTH 4 TIMES DAILY AS NEEDED FOR DIARRHEA OR  LOOSE  STOOLS Patient taking differently: Take 1 tablet by mouth 4  (four) times daily as needed for diarrhea or loose stools. 12/24/20   Josph Macho, MD  esomeprazole (NEXIUM) 20 MG capsule Take 20 mg by mouth every morning.     [provider]  Fluocinolone Acetonide 0.01 % OIL Place 2 Doses in ear(s) daily.    [provider]  hydrALAZINE (APRESOLINE) 25 MG tablet TAKE 1 TABLET BY MOUTH THREE TIMES DAILY 06/14/22   Lewayne Bunting, MD  isosorbide mononitrate (IMDUR) 60 MG 24 hr tablet Take 60 mg by mouth daily. 06/16/22   [provider]  ketoconazole (NIZORAL) 2 % shampoo Apply 1 Application topically 2 (two) times a week.    [provider]  lamoTRIgine (LAMICTAL) 150 MG tablet Take 150 mg by mouth at bedtime. 01/25/22   [provider]  Melatonin Gummies 2.5 MG CHEW Chew 2.5 mg by mouth at bedtime as needed (sleep).    [provider]  mirtazapine (REMERON) 30 MG tablet Take 30 mg by mouth at bedtime. 10/15/21   [provider]  NOVOLOG 100 UNIT/ML injection Inject 25 Units into the skin continuous. 02/21/22   [provider]  PREMARIN vaginal cream Place 1 applicator vaginally daily as needed (irritation). 03/24/21   [provider]  thyroid (ARMOUR THYROID) 60 MG tablet Take 1 tablet by mouth daily before breakfast. 07/30/21   [provider]  torsemide ([name]) 20 MG  tablet Take 1 tablet (20 mg total) by mouth daily with breakfast. 03/31/22   Regalado, Belkys A, MD  torsemide ([name]) 20 MG tablet Take 1 tablet (20 mg total) by mouth at bedtime as needed (for wt gain or leg swelling). 03/30/22   Regalado, Belkys A, MD  valACYclovir (VALTREX) 500 MG tablet Take 500 mg by mouth daily as needed (Flair up).    [provider]      Allergies    Morphine, Penicillins, Tramadol, Acetaminophen, Amitriptyline, Codeine, Ibuprofen, Krill oil, Losartan, Propoxyphene, Shellfish allergy, Statins, Sulfamethoxazole, Sulfites, Fluconazole, and Norvasc [amlodipine besylate]     Review of Systems   Review of Systems  Physical Exam Updated Vital Signs BP (!) 142/85 (BP Location: Left Arm)   Pulse 62   Temp 98.1 F (36.7 C) (Oral)   Resp 18   Ht 4\' 10"  (1.473 m)   Wt 48.5 kg   SpO2 99%   BMI 22.36 kg/m  Physical Exam HENT:     Head: Normocephalic and atraumatic.     Mouth/Throat:     Mouth: Mucous membranes are moist.  Eyes:     Conjunctiva/sclera: Conjunctivae normal.  Cardiovascular:     Rate and Rhythm: Normal rate and regular rhythm.  Pulmonary:     Effort: Pulmonary effort is normal.     Breath sounds: Normal breath sounds.  Abdominal:     General: Abdomen is flat.  Musculoskeletal:     Comments: Left knee with no obvious signs of trauma.  No bruising or erythema.  No swelling.  Patient with tenderness to the medial aspect of the joint.  She is able to plantarflex and dorsiflex without issue.  Normal sensation in her lower extremities.  Compartments are soft.  Neurological:     General: No focal deficit present.     Mental Status: She is alert.  Psychiatric:        Mood and Affect: Mood normal.        Behavior: Behavior normal.     ED Results / Procedures / Treatments   Labs (all labs ordered are listed, but only abnormal results are displayed) Labs Reviewed - No data to display  EKG None  Radiology DG Knee Complete 4 Views Left  Result Date: 08/13/2022 CLINICAL DATA:  Knee injury. Twisted knee yesterday. Medial knee pain. EXAM: LEFT KNEE - COMPLETE 4+ VIEW COMPARISON:  Left tibia/fibula radiographs 08/26/2016 FINDINGS: No acute fracture, dislocation, or knee joint effusion is identified. Joint space widths are preserved. Diffuse atherosclerotic vascular calcification is noted. IMPRESSION: No acute osseous abnormality. Electronically Signed   By: Sebastian Ache M.D.   On: 08/13/2022 15:24    Procedures Procedures  {Document cardiac monitor, telemetry assessment procedure when appropriate:1}  Medications Ordered in ED Medications   oxyCODONE (Oxy IR/ROXICODONE) immediate release tablet 5 mg (has no administration in time range)    ED Course/ Medical Decision Making/ A&P   {   Click here for ABCD2, HEART and other calculatorsREFRESH Note before signing :1}                          Medical Decision Making Well-appearing 67 year old female presenting to the emergency department with left knee pain after mechanical fall/twist.  She is afebrile, nontachycardic and hemodynamically stable.  Physical exam reassuring with no overt injury.  No evidence of septic joint or compartment syndrome.  X-rays negative for acute bony pathology.  However, patient had significant pain with loading of the  knee joint.  Concern for occult fracture.  Will get CT scan.  Will also give dose of pain medications.  Anticipate patient discharged with crutches and/or walker.  See ED course for further MDM and final disposition.  Amount and/or Complexity of Data Reviewed Radiology: ordered.  Risk Prescription drug management.   {Document critical care time when appropriate:1} {Document review of labs and clinical decision tools ie heart score, Chads2Vasc2 etc:1}  {Document your independent review of radiology images, and any outside records:1} {Document your discussion with family members, caretakers, and with consultants:1} {Document social determinants of health affecting pt's care:1} {Document your decision making why or why not admission, treatments were needed:1} Final Clinical Impression(s) / ED Diagnoses Final diagnoses:  None    Rx / DC Orders ED Discharge Orders     None

## 2022-08-13 NOTE — Discharge Instructions (Signed)
May wear the Ace wrap to help with symptoms.  Weight-bear as tolerated.  Please follow-up with your primary doctor soon as possible.  Return if you develop fevers, chills, your leg becomes blue, cold or you have pain out of proportion and not responsive to pain medication.  Please follow-up with your primary doctor as soon as possible.

## 2022-08-13 NOTE — ED Triage Notes (Addendum)
Pt arrived via POV. C/o L knee pain after twisting it tripping over their dog. Maintains most ROM, minimal weight bearing, pain on medial side of knee.  Hx osteoporosis   AOx4

## 2022-08-16 ENCOUNTER — Inpatient Hospital Stay: Payer: Medicare Other

## 2022-08-16 ENCOUNTER — Inpatient Hospital Stay: Payer: Medicare Other | Attending: Hematology & Oncology

## 2022-08-16 ENCOUNTER — Inpatient Hospital Stay (HOSPITAL_BASED_OUTPATIENT_CLINIC_OR_DEPARTMENT_OTHER): Payer: Medicare Other | Admitting: Medical Oncology

## 2022-08-16 ENCOUNTER — Encounter: Payer: Self-pay | Admitting: Medical Oncology

## 2022-08-16 ENCOUNTER — Other Ambulatory Visit: Payer: Self-pay

## 2022-08-16 VITALS — BP 131/46 | HR 57 | Temp 97.7°F | Resp 19 | Ht 59.0 in | Wt 107.4 lb

## 2022-08-16 DIAGNOSIS — Z794 Long term (current) use of insulin: Secondary | ICD-10-CM | POA: Diagnosis not present

## 2022-08-16 DIAGNOSIS — D638 Anemia in other chronic diseases classified elsewhere: Secondary | ICD-10-CM

## 2022-08-16 DIAGNOSIS — Z8673 Personal history of transient ischemic attack (TIA), and cerebral infarction without residual deficits: Secondary | ICD-10-CM | POA: Diagnosis not present

## 2022-08-16 DIAGNOSIS — E1122 Type 2 diabetes mellitus with diabetic chronic kidney disease: Secondary | ICD-10-CM | POA: Diagnosis not present

## 2022-08-16 DIAGNOSIS — D631 Anemia in chronic kidney disease: Secondary | ICD-10-CM | POA: Diagnosis not present

## 2022-08-16 DIAGNOSIS — D460 Refractory anemia without ring sideroblasts, so stated: Secondary | ICD-10-CM | POA: Diagnosis present

## 2022-08-16 DIAGNOSIS — N1831 Chronic kidney disease, stage 3a: Secondary | ICD-10-CM | POA: Diagnosis not present

## 2022-08-16 DIAGNOSIS — N183 Chronic kidney disease, stage 3 unspecified: Secondary | ICD-10-CM

## 2022-08-16 DIAGNOSIS — D500 Iron deficiency anemia secondary to blood loss (chronic): Secondary | ICD-10-CM

## 2022-08-16 DIAGNOSIS — Z79899 Other long term (current) drug therapy: Secondary | ICD-10-CM | POA: Diagnosis not present

## 2022-08-16 DIAGNOSIS — E1022 Type 1 diabetes mellitus with diabetic chronic kidney disease: Secondary | ICD-10-CM

## 2022-08-16 LAB — CBC WITH DIFFERENTIAL (CANCER CENTER ONLY)
Abs Immature Granulocytes: 0.02 10*3/uL (ref 0.00–0.07)
Basophils Absolute: 0.1 10*3/uL (ref 0.0–0.1)
Basophils Relative: 1 %
Eosinophils Absolute: 0.8 10*3/uL — ABNORMAL HIGH (ref 0.0–0.5)
Eosinophils Relative: 12 %
HCT: 26.5 % — ABNORMAL LOW (ref 36.0–46.0)
Hemoglobin: 8.7 g/dL — ABNORMAL LOW (ref 12.0–15.0)
Immature Granulocytes: 0 %
Lymphocytes Relative: 25 %
Lymphs Abs: 1.7 10*3/uL (ref 0.7–4.0)
MCH: 26.4 pg (ref 26.0–34.0)
MCHC: 32.8 g/dL (ref 30.0–36.0)
MCV: 80.5 fL (ref 80.0–100.0)
Monocytes Absolute: 0.7 10*3/uL (ref 0.1–1.0)
Monocytes Relative: 11 %
Neutro Abs: 3.5 10*3/uL (ref 1.7–7.7)
Neutrophils Relative %: 51 %
Platelet Count: 303 10*3/uL (ref 150–400)
RBC: 3.29 MIL/uL — ABNORMAL LOW (ref 3.87–5.11)
RDW: 17.2 % — ABNORMAL HIGH (ref 11.5–15.5)
WBC Count: 6.8 10*3/uL (ref 4.0–10.5)
nRBC: 0 % (ref 0.0–0.2)

## 2022-08-16 LAB — CMP (CANCER CENTER ONLY)
ALT: 11 U/L (ref 0–44)
AST: 20 U/L (ref 15–41)
Albumin: 3.6 g/dL (ref 3.5–5.0)
Alkaline Phosphatase: 67 U/L (ref 38–126)
Anion gap: 10 (ref 5–15)
BUN: 59 mg/dL — ABNORMAL HIGH (ref 8–23)
CO2: 22 mmol/L (ref 22–32)
Calcium: 8.8 mg/dL — ABNORMAL LOW (ref 8.9–10.3)
Chloride: 99 mmol/L (ref 98–111)
Creatinine: 2.58 mg/dL — ABNORMAL HIGH (ref 0.44–1.00)
GFR, Estimated: 20 mL/min — ABNORMAL LOW (ref >60)
Glucose, Bld: 108 mg/dL — ABNORMAL HIGH (ref 70–99)
Potassium: 4.6 mmol/L (ref 3.5–5.1)
Sodium: 131 mmol/L — ABNORMAL LOW (ref 135–145)
Total Bilirubin: 0.4 mg/dL (ref 0.3–1.2)
Total Protein: 5.8 g/dL — ABNORMAL LOW (ref 6.5–8.1)

## 2022-08-16 LAB — FERRITIN: Ferritin: 215 ng/mL (ref 11–307)

## 2022-08-16 MED ORDER — DARBEPOETIN ALFA 300 MCG/0.6ML IJ SOSY
300.0000 ug | PREFILLED_SYRINGE | Freq: Once | INTRAMUSCULAR | Status: AC
  Administered 2022-08-16: 300 ug via SUBCUTANEOUS
  Filled 2022-08-16: qty 0.6

## 2022-08-16 NOTE — Progress Notes (Signed)
Hematology and Oncology Follow Up Visit  Brittney Pace [id number] 1955/09/23 67 y.o. 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 She brings her husband with her today.   At her last visit she was found to have a UTI. Today she is feeling a bit fatigued and with diarrhea that started today. She does report that she has IBS with chronic alternating diarrhea and constipation.   No fevers, night sweats, unintentional weight loss, bleeding episodes or significant bruising episodes. She does report that stool looks a bit orange  Currently, I would say that her performance status is probably ECOG 2-3.   Wt Readings from Last 3 Encounters:  08/16/22 107 lb 6.4 oz (48.7 kg)  08/13/22 107 lb (48.5 kg)  07/07/22 106 lb 0.6 oz (48.1 kg)    Medications:  Allergies as of 08/16/2022       Reactions   Morphine Anaphylaxis, Shortness Of Breath   Penicillins Hives   Tolerated ANCEF on 04/30/20 Has patient had a PCN reaction causing immediate rash, facial/tongue/throat swelling, SOB or lightheadedness with hypotension: Yes Has patient had a PCN reaction causing severe rash involving mucus membranes or skin necrosis: No Has patient had a PCN reaction that required hospitalization No Has patient had a PCN reaction occurring within the last 10 years: No If all of the above answers are "NO", then may proceed with Cephalosporin use.   Tramadol Anaphylaxis   Acetaminophen Other (See Comments)   Alters insulin pump readings   Amitriptyline Other (See Comments)   Severe headache/ out of body feeling   Codeine Other (See Comments)   Severe headaches/ out of body feeling   Ibuprofen Other (See Comments)   Messes up CGM reading on glucose monitor   Krill Oil Diarrhea,  Itching, Nausea And Vomiting   Losartan Cough   Propoxyphene Other (See Comments)   Severe headaches / out of body feeling   Shellfish Allergy Diarrhea, Nausea And Vomiting   Statins Other (See Comments)   Muscle pains Other reaction(s): Other   Sulfamethoxazole Other (See Comments)   Mouth ulcers   Sulfites Itching, Other (See Comments)   Mouth ulcers   Fluconazole    Other reaction(s): Contact Dermatitis (intolerance)   Norvasc [amlodipine Besylate] Swelling        Medication List        Accurate as of August 16, 2022 11:16 AM. If you have any questions, ask your nurse or doctor.          Accu-Chek Guide test strip Generic drug: glucose blood 3 (three) times daily.   Alpha-Lipoic Acid 100 MG Caps Take 1 capsule by mouth daily.   Armour Thyroid 60 MG tablet Generic drug: thyroid Take 1 tablet by mouth daily before breakfast.   aspirin EC 325 MG tablet Take 1 tablet (325 mg total) by mouth daily. What changed: when to take this   carvedilol 12.5 MG tablet Commonly known as: COREG TAKE 1 TABLET BY MOUTH TWICE DAILY WITH A MEAL   cetirizine 10 MG tablet Commonly known as: ZYRTEC Take 10 mg by mouth at bedtime.   Dexcom G6 Sensor Misc USE TO CONTINUOUSLY MONITOR BLOOD SUGARS. CHANGE EVERY 10 DAYS   diphenoxylate-atropine 2.5-0.025 MG tablet Commonly known as: LOMOTIL TAKE  1 TABLET BY MOUTH 4 TIMES DAILY AS NEEDED FOR DIARRHEA OR  LOOSE  STOOLS What changed: See the new instructions.   esomeprazole 20 MG capsule Commonly known as: NEXIUM Take 20 mg by mouth every morning.   Fluocinolone Acetonide 0.01 % Oil Place 2 Doses in ear(s) daily.   fosfomycin 3 g Pack Commonly known as: MONUROL Divide packet into 4 equal doses. Take 1/4th packet by mouth with liquid twice daily for 1 day. You will have 1/2 packet left over that you will not take.   hydrALAZINE 25 MG tablet Commonly known as: APRESOLINE TAKE 1 TABLET BY MOUTH THREE TIMES DAILY   isosorbide  mononitrate 60 MG 24 hr tablet Commonly known as: IMDUR Take 60 mg by mouth daily.   ketoconazole 2 % shampoo Commonly known as: NIZORAL Apply 1 Application topically 2 (two) times a week.   lamoTRIgine 150 MG tablet Commonly known as: LAMICTAL Take 150 mg by mouth at bedtime.   Melatonin Gummies 2.5 MG Chew Chew 2.5 mg by mouth at bedtime as needed (sleep).   mirtazapine 30 MG tablet Commonly known as: REMERON Take 30 mg by mouth at bedtime.   NovoLOG 100 UNIT/ML injection Generic drug: insulin aspart Inject 25 Units into the skin continuous.   Premarin vaginal cream Generic drug: conjugated estrogens Place 1 applicator vaginally daily as needed (irritation).   torsemide 20 MG tablet Commonly known as: [name] Take 1 tablet (20 mg total) by mouth at bedtime as needed (for wt gain or leg swelling).   torsemide 20 MG tablet Commonly known as: [name] Take 1 tablet (20 mg total) by mouth daily with breakfast.   valACYclovir 500 MG tablet Commonly known as: VALTREX Take 500 mg by mouth daily as needed (Flair up).        Allergies:  Allergies  Allergen Reactions   Morphine Anaphylaxis and Shortness Of Breath   Penicillins Hives    Tolerated ANCEF on 04/30/20 Has patient had a PCN reaction causing immediate rash, facial/tongue/throat swelling, SOB or lightheadedness with hypotension: Yes Has patient had a PCN reaction causing severe rash involving mucus membranes or skin necrosis: No Has patient had a PCN reaction that required hospitalization No Has patient had a PCN reaction occurring within the last 10 years: No If all of the above answers are "NO", then may proceed with Cephalosporin use.   Tramadol Anaphylaxis   Acetaminophen Other (See Comments)    Alters insulin pump readings    Amitriptyline Other (See Comments)    Severe headache/ out of body feeling   Codeine Other (See Comments)    Severe headaches/ out of body feeling    Ibuprofen Other (See  Comments)    Messes up CGM reading on glucose monitor     Krill Oil Diarrhea, Itching and Nausea And Vomiting   Losartan Cough   Propoxyphene Other (See Comments)    Severe headaches / out of body feeling    Shellfish Allergy Diarrhea and Nausea And Vomiting   Statins Other (See Comments)    Muscle pains Other reaction(s): Other   Sulfamethoxazole Other (See Comments)    Mouth ulcers    Sulfites Itching and Other (See Comments)    Mouth ulcers   Fluconazole     Other reaction(s): Contact Dermatitis (intolerance)   Norvasc [Amlodipine Besylate] Swelling    Past Medical History, Surgical history, Social history, and Family History were reviewed and updated.  Review of Systems: Review of Systems  Constitutional:  Negative for chills,  fever and malaise/fatigue.  HENT: Negative.    Eyes: Negative.   Respiratory: Negative.    Cardiovascular:  Negative for leg swelling.  Gastrointestinal:  Positive for diarrhea. Negative for abdominal pain.  Genitourinary:  Negative for dysuria, flank pain, frequency, hematuria and urgency.  Musculoskeletal:  Positive for joint pain and myalgias.  Skin:  Negative for rash.  Neurological: Negative.   Endo/Heme/Allergies: Negative.   Psychiatric/Behavioral: Negative.     Wt Readings from Last 3 Encounters:  08/16/22 107 lb 6.4 oz (48.7 kg)  08/13/22 107 lb (48.5 kg)  07/07/22 106 lb 0.6 oz (48.1 kg)   Vitals:   08/16/22 1034  BP: (!) 131/46  Pulse: (!) 57  Resp: 19  Temp: 97.7 F (36.5 C)  SpO2: 100%     Physical Exam Vitals reviewed.  HENT:     Head: Normocephalic and atraumatic.  Eyes:     Pupils: Pupils are equal, round, and reactive to light.  Cardiovascular:     Rate and Rhythm: Normal rate and regular rhythm.     Heart sounds: Normal heart sounds.  Pulmonary:     Effort: Pulmonary effort is normal.     Breath sounds: Normal breath sounds.  Abdominal:     General: Bowel sounds are normal.     Palpations: Abdomen is  soft.  Musculoskeletal:        General: No tenderness or deformity. Normal range of motion.     Cervical back: Normal range of motion.  Lymphadenopathy:     Cervical: No cervical adenopathy.  Skin:    General: Skin is warm and dry.     Findings: No erythema or rash.  Neurological:     Mental Status: She is alert and oriented to person, place, and time.  Psychiatric:        Behavior: Behavior normal.        Thought Content: Thought content normal.        Judgment: Judgment normal.     Lab Results  Component Value Date   WBC 6.8 08/16/2022   HGB 8.7 (L) 08/16/2022   HCT 26.5 (L) 08/16/2022   MCV 80.5 08/16/2022   PLT 303 08/16/2022   Lab Results  Component Value Date   FERRITIN 68 07/07/2022   IRON 75 07/07/2022   TIBC 272 07/07/2022   UIBC 197 07/07/2022   IRONPCTSAT 28 07/07/2022   Lab Results  Component Value Date   RETICCTPCT 0.5 06/14/2022   RBC 3.29 (L) 08/16/2022   No results found for: "KPAFRELGTCHN", "LAMBDASER", "KAPLAMBRATIO" No results found for: "IGGSERUM", "IGA", "IGMSERUM" No results found for: "TOTALPROTELP", "ALBUMINELP", "A1GS", "A2GS", "BETS", "BETA2SER", "GAMS", "MSPIKE", "SPEI"   Chemistry      Component Value Date/Time   NA 131 (L) 08/16/2022 1003   NA 138 07/17/2017 1128   K 4.6 08/16/2022 1003   CL 99 08/16/2022 1003   CO2 22 08/16/2022 1003   BUN 59 (H) 08/16/2022 1003   BUN 23 07/17/2017 1128   CREATININE 2.58 (H) 08/16/2022 1003   CREATININE 2.45 (H) 03/03/2022 1129      Component Value Date/Time   CALCIUM 8.8 (L) 08/16/2022 1003   ALKPHOS 67 08/16/2022 1003   AST 20 08/16/2022 1003   ALT 11 08/16/2022 1003   BILITOT 0.4 08/16/2022 1003     Encounter Diagnoses  Name Primary?   Anemia due to stage 3a chronic kidney disease (HCC) Yes   Iron deficiency anemia due to chronic blood loss    Erythropoietin deficiency anemia  Impression and Plan: Ms. Behrle is a very pleasant 67 yo female with multifactorial anemia.     Today review of her labs show a Hgb of 8.7 which is down from 12.1 at last visit 1 month ago. She reports no known bleeding but has had "orange" diarrhea today and questions if this could be blood. Overall labs not overwhelming suggestive of bleed as origin of Hgb drop- likely the extended interval between visits. That being said we discussed red flag signs and symptoms, I have ordered hemoccult cards and she will go to the ER if symptoms worsen. Creatinine waxes and wanes between 2.3 and 2.5- today it is 2.58. At her 07/07/2022 visit her ferritin was 68, Iron saturation was 28 with total iron of 75.   Disposition: Aranesp today Hemoccult cards given She will call her GI to schedule colo/endo RTC 1 week APP, labs ( CBC, hold tube) +- Venofer +- Aranesp RTC 1 week + 1 day blood transfusion -[place] RTC 4 weeks APP,labs(CBC, CMP, iron, ferritin), aranesp (we will stay at 1 month follow ups per patient request)  Rushie Chestnut, PA-C 7/23/202411:16 AM

## 2022-08-16 NOTE — Patient Instructions (Signed)

## 2022-08-17 LAB — IRON AND IRON BINDING CAPACITY (CC-WL,HP ONLY)
Iron: 89 ug/dL (ref 28–170)
Saturation Ratios: 37 % — ABNORMAL HIGH (ref 10.4–31.8)
TIBC: 242 ug/dL — ABNORMAL LOW (ref 250–450)
UIBC: 153 ug/dL (ref 148–442)

## 2022-08-23 ENCOUNTER — Ambulatory Visit: Payer: Medicare Other | Admitting: Medical Oncology

## 2022-08-23 ENCOUNTER — Inpatient Hospital Stay: Payer: Medicare Other

## 2022-08-23 ENCOUNTER — Ambulatory Visit: Payer: Medicare Other

## 2022-08-25 ENCOUNTER — Inpatient Hospital Stay (HOSPITAL_BASED_OUTPATIENT_CLINIC_OR_DEPARTMENT_OTHER): Payer: Medicare Other | Admitting: Medical Oncology

## 2022-08-25 ENCOUNTER — Other Ambulatory Visit: Payer: Self-pay | Admitting: Medical Oncology

## 2022-08-25 ENCOUNTER — Other Ambulatory Visit: Payer: Self-pay

## 2022-08-25 ENCOUNTER — Inpatient Hospital Stay: Payer: Medicare Other

## 2022-08-25 ENCOUNTER — Inpatient Hospital Stay: Payer: Medicare Other | Attending: Hematology & Oncology

## 2022-08-25 ENCOUNTER — Other Ambulatory Visit: Payer: Self-pay | Admitting: *Deleted

## 2022-08-25 VITALS — BP 127/57 | HR 67 | Resp 18 | Ht 59.0 in | Wt 109.0 lb

## 2022-08-25 DIAGNOSIS — N1831 Chronic kidney disease, stage 3a: Secondary | ICD-10-CM | POA: Insufficient documentation

## 2022-08-25 DIAGNOSIS — E039 Hypothyroidism, unspecified: Secondary | ICD-10-CM

## 2022-08-25 DIAGNOSIS — Z794 Long term (current) use of insulin: Secondary | ICD-10-CM | POA: Insufficient documentation

## 2022-08-25 DIAGNOSIS — R5383 Other fatigue: Secondary | ICD-10-CM

## 2022-08-25 DIAGNOSIS — D638 Anemia in other chronic diseases classified elsewhere: Secondary | ICD-10-CM

## 2022-08-25 DIAGNOSIS — Z8673 Personal history of transient ischemic attack (TIA), and cerebral infarction without residual deficits: Secondary | ICD-10-CM | POA: Diagnosis not present

## 2022-08-25 DIAGNOSIS — N184 Chronic kidney disease, stage 4 (severe): Secondary | ICD-10-CM | POA: Insufficient documentation

## 2022-08-25 DIAGNOSIS — Z992 Dependence on renal dialysis: Secondary | ICD-10-CM

## 2022-08-25 DIAGNOSIS — E1022 Type 1 diabetes mellitus with diabetic chronic kidney disease: Secondary | ICD-10-CM

## 2022-08-25 DIAGNOSIS — D631 Anemia in chronic kidney disease: Secondary | ICD-10-CM

## 2022-08-25 DIAGNOSIS — D460 Refractory anemia without ring sideroblasts, so stated: Secondary | ICD-10-CM | POA: Diagnosis present

## 2022-08-25 DIAGNOSIS — N183 Chronic kidney disease, stage 3 unspecified: Secondary | ICD-10-CM

## 2022-08-25 DIAGNOSIS — D640 Hereditary sideroblastic anemia: Secondary | ICD-10-CM | POA: Insufficient documentation

## 2022-08-25 DIAGNOSIS — R5381 Other malaise: Secondary | ICD-10-CM | POA: Diagnosis not present

## 2022-08-25 DIAGNOSIS — Z79899 Other long term (current) drug therapy: Secondary | ICD-10-CM | POA: Insufficient documentation

## 2022-08-25 DIAGNOSIS — N186 End stage renal disease: Secondary | ICD-10-CM

## 2022-08-25 LAB — BASIC METABOLIC PANEL - CANCER CENTER ONLY
Anion gap: 10 (ref 5–15)
BUN: 37 mg/dL — ABNORMAL HIGH (ref 8–23)
CO2: 24 mmol/L (ref 22–32)
Calcium: 8.5 mg/dL — ABNORMAL LOW (ref 8.9–10.3)
Chloride: 101 mmol/L (ref 98–111)
Creatinine: 2.65 mg/dL — ABNORMAL HIGH (ref 0.44–1.00)
GFR, Estimated: 19 mL/min — ABNORMAL LOW (ref >60)
Glucose, Bld: 187 mg/dL — ABNORMAL HIGH (ref 70–99)
Potassium: 4.8 mmol/L (ref 3.5–5.1)
Sodium: 135 mmol/L (ref 135–145)

## 2022-08-25 LAB — VITAMIN B12: Vitamin B-12: 489 pg/mL (ref 180–914)

## 2022-08-25 LAB — IRON AND IRON BINDING CAPACITY (CC-WL,HP ONLY)
Iron: 46 ug/dL (ref 28–170)
Saturation Ratios: 20 % (ref 10.4–31.8)
TIBC: 232 ug/dL — ABNORMAL LOW (ref 250–450)
UIBC: 186 ug/dL (ref 148–442)

## 2022-08-25 LAB — CBC
HCT: 29.2 % — ABNORMAL LOW (ref 36.0–46.0)
Hemoglobin: 9.4 g/dL — ABNORMAL LOW (ref 12.0–15.0)
MCH: 27.9 pg (ref 26.0–34.0)
MCHC: 32.2 g/dL (ref 30.0–36.0)
MCV: 86.6 fL (ref 80.0–100.0)
Platelets: 314 10*3/uL (ref 150–400)
RBC: 3.37 MIL/uL — ABNORMAL LOW (ref 3.87–5.11)
RDW: 22.2 % — ABNORMAL HIGH (ref 11.5–15.5)
WBC: 4.5 10*3/uL (ref 4.0–10.5)
nRBC: 0 % (ref 0.0–0.2)

## 2022-08-25 LAB — SAMPLE TO BLOOD BANK

## 2022-08-25 LAB — MAGNESIUM: Magnesium: 2 mg/dL (ref 1.7–2.4)

## 2022-08-25 LAB — OCCULT BLOOD X 1 CARD TO LAB, STOOL
Fecal Occult Bld: NEGATIVE
Fecal Occult Bld: NEGATIVE
Fecal Occult Bld: NEGATIVE

## 2022-08-25 LAB — PHOSPHORUS: Phosphorus: 4.4 mg/dL (ref 2.5–4.6)

## 2022-08-25 LAB — TSH: TSH: 0.759 u[IU]/mL (ref 0.350–4.500)

## 2022-08-25 LAB — VITAMIN D 25 HYDROXY (VIT D DEFICIENCY, FRACTURES): Vit D, 25-Hydroxy: 36.84 ng/mL (ref 30–100)

## 2022-08-25 LAB — FERRITIN: Ferritin: 69 ng/mL (ref 11–307)

## 2022-08-25 MED ORDER — DARBEPOETIN ALFA 300 MCG/0.6ML IJ SOSY
300.0000 ug | PREFILLED_SYRINGE | Freq: Once | INTRAMUSCULAR | Status: AC
  Administered 2022-08-25: 300 ug via SUBCUTANEOUS
  Filled 2022-08-25: qty 0.6

## 2022-08-25 NOTE — Progress Notes (Addendum)
Hematology and Oncology Follow Up Visit  Brittney Pace [id number] 1955/05/11 67 y.o. 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 is here today for follow-up. She brings her husband with her today.   She has been having a more difficult time recently. She had a UTI and diarrhea prior to that. Those have resolved per patient but she has had progressive generalized malaise since this time. Glucose values have been normal to actually a bit better controlled than normal.   No fevers, night sweats, unintentional weight loss, bleeding episodes or significant bruising episodes.   Currently, I would say that her performance status is probably ECOG 2-3.   Wt Readings from Last 3 Encounters:  08/25/22 109 lb (49.4 kg)  08/16/22 107 lb 6.4 oz (48.7 kg)  08/13/22 107 lb (48.5 kg)    Medications:  Allergies as of 08/25/2022       Reactions   Morphine Anaphylaxis, Shortness Of Breath   Penicillins Hives   Tolerated ANCEF on 04/30/20 Has patient had a PCN reaction causing immediate rash, facial/tongue/throat swelling, SOB or lightheadedness with hypotension: Yes Has patient had a PCN reaction causing severe rash involving mucus membranes or skin necrosis: No Has patient had a PCN reaction that required hospitalization No Has patient had a PCN reaction occurring within the last 10 years: No If all of the above answers are "NO", then may proceed with Cephalosporin use.   Tramadol Anaphylaxis   Acetaminophen Other (See Comments)   Alters insulin pump readings   Amitriptyline Other (See Comments)   Severe headache/ out of body feeling   Codeine Other (See Comments)   Severe headaches/ out of body feeling   Ibuprofen Other (See Comments)   Messes up CGM reading on glucose monitor   Krill Oil  Diarrhea, Itching, Nausea And Vomiting   Losartan Cough   Propoxyphene Other (See Comments)   Severe headaches / out of body feeling   Shellfish Allergy Diarrhea, Nausea And Vomiting   Statins Other (See Comments)   Muscle pains Other reaction(s): Other   Sulfamethoxazole Other (See Comments)   Mouth ulcers   Sulfites Itching, Other (See Comments)   Mouth ulcers   Fluconazole    Other reaction(s): Contact Dermatitis (intolerance)   Norvasc [amlodipine Besylate] Swelling        Medication List        Accurate as of August 25, 2022  1:21 PM. If you have any questions, ask your nurse or doctor.          Accu-Chek Guide test strip Generic drug: glucose blood 3 (three) times daily.   Alpha-Lipoic Acid 100 MG Caps Take 1 capsule by mouth daily.   Armour Thyroid 60 MG tablet Generic drug: thyroid Take 1 tablet by mouth daily before breakfast.   aspirin EC 325 MG tablet Take 1 tablet (325 mg total) by mouth daily. What changed: when to take this   carvedilol 12.5 MG tablet Commonly known as: COREG TAKE 1 TABLET BY MOUTH TWICE DAILY WITH A MEAL   cetirizine 10 MG tablet Commonly known as: ZYRTEC Take 10 mg by mouth at bedtime.   Dexcom G6 Sensor Misc USE TO CONTINUOUSLY MONITOR BLOOD SUGARS. CHANGE EVERY 10 DAYS   diphenoxylate-atropine 2.5-0.025 MG tablet Commonly known as: LOMOTIL TAKE  1 TABLET BY MOUTH 4 TIMES DAILY AS NEEDED FOR DIARRHEA OR  LOOSE  STOOLS What changed: See the new instructions.   esomeprazole 20 MG capsule Commonly known as: NEXIUM Take 20 mg by mouth every morning.   Fluocinolone Acetonide 0.01 % Oil Place 2 Doses in ear(s) daily.   fosfomycin 3 g Pack Commonly known as: MONUROL Divide packet into 4 equal doses. Take 1/4th packet by mouth with liquid twice daily for 1 day. You will have 1/2 packet left over that you will not take.   hydrALAZINE 25 MG tablet Commonly known as: APRESOLINE TAKE 1 TABLET BY MOUTH THREE TIMES DAILY    isosorbide mononitrate 60 MG 24 hr tablet Commonly known as: IMDUR Take 60 mg by mouth daily.   ketoconazole 2 % shampoo Commonly known as: NIZORAL Apply 1 Application topically 2 (two) times a week.   lamoTRIgine 150 MG tablet Commonly known as: LAMICTAL Take 150 mg by mouth at bedtime.   Melatonin Gummies 2.5 MG Chew Chew 2.5 mg by mouth at bedtime as needed (sleep).   mirtazapine 30 MG tablet Commonly known as: REMERON Take 30 mg by mouth at bedtime.   NovoLOG 100 UNIT/ML injection Generic drug: insulin aspart Inject 25 Units into the skin continuous.   Premarin vaginal cream Generic drug: conjugated estrogens Place 1 applicator vaginally daily as needed (irritation).   torsemide 20 MG tablet Commonly known as: [name] Take 1 tablet (20 mg total) by mouth at bedtime as needed (for wt gain or leg swelling).   torsemide 20 MG tablet Commonly known as: [name] Take 1 tablet (20 mg total) by mouth daily with breakfast.   valACYclovir 500 MG tablet Commonly known as: VALTREX Take 500 mg by mouth daily as needed (Flair up).        Allergies:  Allergies  Allergen Reactions   Morphine Anaphylaxis and Shortness Of Breath   Penicillins Hives    Tolerated ANCEF on 04/30/20 Has patient had a PCN reaction causing immediate rash, facial/tongue/throat swelling, SOB or lightheadedness with hypotension: Yes Has patient had a PCN reaction causing severe rash involving mucus membranes or skin necrosis: No Has patient had a PCN reaction that required hospitalization No Has patient had a PCN reaction occurring within the last 10 years: No If all of the above answers are "NO", then may proceed with Cephalosporin use.   Tramadol Anaphylaxis   Acetaminophen Other (See Comments)    Alters insulin pump readings    Amitriptyline Other (See Comments)    Severe headache/ out of body feeling   Codeine Other (See Comments)    Severe headaches/ out of body feeling    Ibuprofen Other  (See Comments)    Messes up CGM reading on glucose monitor     Krill Oil Diarrhea, Itching and Nausea And Vomiting   Losartan Cough   Propoxyphene Other (See Comments)    Severe headaches / out of body feeling    Shellfish Allergy Diarrhea and Nausea And Vomiting   Statins Other (See Comments)    Muscle pains Other reaction(s): Other   Sulfamethoxazole Other (See Comments)    Mouth ulcers    Sulfites Itching and Other (See Comments)    Mouth ulcers   Fluconazole     Other reaction(s): Contact Dermatitis (intolerance)   Norvasc [Amlodipine Besylate] Swelling    Past Medical History, Surgical history, Social history, and Family History were reviewed and updated.  Review of Systems: Review of Systems  Constitutional:  Negative for chills,  fever and malaise/fatigue.  HENT: Negative.    Eyes: Negative.   Respiratory: Negative.    Cardiovascular:  Negative for leg swelling.  Gastrointestinal:  Negative for abdominal pain and diarrhea.  Genitourinary:  Negative for dysuria, flank pain, frequency, hematuria and urgency.  Musculoskeletal:  Positive for joint pain and myalgias.  Skin:  Negative for rash.  Neurological: Negative.   Endo/Heme/Allergies: Negative.   Psychiatric/Behavioral: Negative.     Wt Readings from Last 3 Encounters:  08/25/22 109 lb (49.4 kg)  08/16/22 107 lb 6.4 oz (48.7 kg)  08/13/22 107 lb (48.5 kg)   Vitals:   08/25/22 1007  BP: (!) 127/57  Pulse: 67  Resp: 18  SpO2: 100%     Physical Exam Vitals reviewed.  HENT:     Head: Normocephalic and atraumatic.  Eyes:     Pupils: Pupils are equal, round, and reactive to light.  Cardiovascular:     Rate and Rhythm: Normal rate and regular rhythm.     Heart sounds: Normal heart sounds.  Pulmonary:     Effort: Pulmonary effort is normal.     Breath sounds: Normal breath sounds.  Abdominal:     General: Bowel sounds are normal.     Palpations: Abdomen is soft.  Musculoskeletal:        General: No  tenderness or deformity. Normal range of motion.     Cervical back: Normal range of motion.  Lymphadenopathy:     Cervical: No cervical adenopathy.  Skin:    General: Skin is warm and dry.     Findings: No erythema or rash.  Neurological:     Mental Status: She is alert and oriented to person, place, and time.  Psychiatric:        Behavior: Behavior normal.        Thought Content: Thought content normal.        Judgment: Judgment normal.     Lab Results  Component Value Date   WBC 4.5 08/25/2022   HGB 9.4 (L) 08/25/2022   HCT 29.2 (L) 08/25/2022   MCV 86.6 08/25/2022   PLT 314 08/25/2022   Lab Results  Component Value Date   FERRITIN 215 08/16/2022   IRON 46 08/25/2022   TIBC 232 (L) 08/25/2022   UIBC 186 08/25/2022   IRONPCTSAT 20 08/25/2022   Lab Results  Component Value Date   RETICCTPCT 0.5 06/14/2022   RBC 3.37 (L) 08/25/2022   No results found for: "KPAFRELGTCHN", "LAMBDASER", "KAPLAMBRATIO" No results found for: "IGGSERUM", "IGA", "IGMSERUM" No results found for: "TOTALPROTELP", "ALBUMINELP", "A1GS", "A2GS", "BETS", "BETA2SER", "GAMS", "MSPIKE", "SPEI"   Chemistry      Component Value Date/Time   NA 135 08/25/2022 0954   NA 138 07/17/2017 1128   K 4.8 08/25/2022 0954   CL 101 08/25/2022 0954   CO2 24 08/25/2022 0954   BUN 37 (H) 08/25/2022 0954   BUN 23 07/17/2017 1128   CREATININE 2.65 (H) 08/25/2022 0954   CREATININE 2.45 (H) 03/03/2022 1129      Component Value Date/Time   CALCIUM 8.5 (L) 08/25/2022 0954   ALKPHOS 67 08/16/2022 1003   AST 20 08/16/2022 1003   ALT 11 08/16/2022 1003   BILITOT 0.4 08/16/2022 1003     Encounter Diagnoses  Name Primary?   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 Blyth  is a very pleasant 67 yo female with multifactorial anemia.    Today review of her labs show an improved  Hgb of 9.4. Hemoccult cards are negative x 3. Calcium is down as bit- she will increase her calcium intake (she had decreased intake) through diet ideally. Creatinine waxes and wanes between 2.3 and 2.5- today it is 2.65. Will check other labs regarding her fatigue and generalized malaise.   Disposition: Aranesp today She will increase her calcium intake  RTC 3 weeks APP,labs(CBC, CMP, iron, ferritin), aranesp (we will stay at 1 month follow ups per patient request)-[place]  Rushie Chestnut, PA-C 8/1/20241:21 PM

## 2022-08-25 NOTE — Patient Instructions (Signed)

## 2022-08-26 ENCOUNTER — Inpatient Hospital Stay: Payer: Medicare Other

## 2022-09-14 ENCOUNTER — Other Ambulatory Visit: Payer: Self-pay

## 2022-09-14 DIAGNOSIS — D638 Anemia in other chronic diseases classified elsewhere: Secondary | ICD-10-CM

## 2022-09-15 ENCOUNTER — Inpatient Hospital Stay (HOSPITAL_BASED_OUTPATIENT_CLINIC_OR_DEPARTMENT_OTHER): Payer: Medicare Other | Admitting: Medical Oncology

## 2022-09-15 ENCOUNTER — Encounter: Payer: Self-pay | Admitting: Medical Oncology

## 2022-09-15 ENCOUNTER — Inpatient Hospital Stay: Payer: Medicare Other

## 2022-09-15 ENCOUNTER — Other Ambulatory Visit: Payer: Self-pay

## 2022-09-15 VITALS — BP 135/95 | HR 75 | Temp 97.9°F | Resp 16 | Ht 59.0 in | Wt 107.0 lb

## 2022-09-15 DIAGNOSIS — D638 Anemia in other chronic diseases classified elsewhere: Secondary | ICD-10-CM

## 2022-09-15 DIAGNOSIS — N1831 Chronic kidney disease, stage 3a: Secondary | ICD-10-CM | POA: Diagnosis not present

## 2022-09-15 DIAGNOSIS — D631 Anemia in chronic kidney disease: Secondary | ICD-10-CM | POA: Diagnosis not present

## 2022-09-15 DIAGNOSIS — D640 Hereditary sideroblastic anemia: Secondary | ICD-10-CM | POA: Diagnosis not present

## 2022-09-15 DIAGNOSIS — E1022 Type 1 diabetes mellitus with diabetic chronic kidney disease: Secondary | ICD-10-CM | POA: Diagnosis not present

## 2022-09-15 LAB — CBC WITH DIFFERENTIAL (CANCER CENTER ONLY)
Abs Immature Granulocytes: 0.02 10*3/uL (ref 0.00–0.07)
Basophils Absolute: 0.1 10*3/uL (ref 0.0–0.1)
Basophils Relative: 1 %
Eosinophils Absolute: 0.4 10*3/uL (ref 0.0–0.5)
Eosinophils Relative: 7 %
HCT: 36.7 % (ref 36.0–46.0)
Hemoglobin: 11 g/dL — ABNORMAL LOW (ref 12.0–15.0)
Immature Granulocytes: 0 %
Lymphocytes Relative: 17 %
Lymphs Abs: 1.1 10*3/uL (ref 0.7–4.0)
MCH: 28.6 pg (ref 26.0–34.0)
MCHC: 30 g/dL (ref 30.0–36.0)
MCV: 95.6 fL (ref 80.0–100.0)
Monocytes Absolute: 0.6 10*3/uL (ref 0.1–1.0)
Monocytes Relative: 9 %
Neutro Abs: 4.3 10*3/uL (ref 1.7–7.7)
Neutrophils Relative %: 66 %
Platelet Count: 342 10*3/uL (ref 150–400)
RBC: 3.84 MIL/uL — ABNORMAL LOW (ref 3.87–5.11)
RDW: 17.7 % — ABNORMAL HIGH (ref 11.5–15.5)
WBC Count: 6.5 10*3/uL (ref 4.0–10.5)
nRBC: 0 % (ref 0.0–0.2)

## 2022-09-15 LAB — CMP (CANCER CENTER ONLY)
ALT: 9 U/L (ref 0–44)
AST: 16 U/L (ref 15–41)
Albumin: 3.6 g/dL (ref 3.5–5.0)
Alkaline Phosphatase: 81 U/L (ref 38–126)
Anion gap: 10 (ref 5–15)
BUN: 51 mg/dL — ABNORMAL HIGH (ref 8–23)
CO2: 27 mmol/L (ref 22–32)
Calcium: 8.8 mg/dL — ABNORMAL LOW (ref 8.9–10.3)
Chloride: 98 mmol/L (ref 98–111)
Creatinine: 3.05 mg/dL — ABNORMAL HIGH (ref 0.44–1.00)
GFR, Estimated: 16 mL/min — ABNORMAL LOW (ref >60)
Glucose, Bld: 179 mg/dL — ABNORMAL HIGH (ref 70–99)
Potassium: 5.1 mmol/L (ref 3.5–5.1)
Sodium: 135 mmol/L (ref 135–145)
Total Bilirubin: 0.4 mg/dL (ref 0.3–1.2)
Total Protein: 5.9 g/dL — ABNORMAL LOW (ref 6.5–8.1)

## 2022-09-15 LAB — IRON AND IRON BINDING CAPACITY (CC-WL,HP ONLY)
Iron: 66 ug/dL (ref 28–170)
Saturation Ratios: 22 % (ref 10.4–31.8)
TIBC: 298 ug/dL (ref 250–450)
UIBC: 232 ug/dL (ref 148–442)

## 2022-09-15 LAB — FERRITIN: Ferritin: 30 ng/mL (ref 11–307)

## 2022-09-15 NOTE — Progress Notes (Signed)
Hematology and Oncology Follow Up Visit  Quanasia Kinne [id number] 1955/08/12 67 y.o. 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 Mcgatha is here today for follow-up.   She reports being stressed. She is having a hard time getting her insulin pump parts approved by insurance. She has a few left but is getting nervous about possibly running out.   No fevers, night sweats, unintentional weight loss, bleeding episodes or significant bruising episodes.   Currently, I would say that her performance status is probably ECOG 2-3.   Wt Readings from Last 3 Encounters:  09/15/22 107 lb (48.5 kg)  08/25/22 109 lb (49.4 kg)  08/16/22 107 lb 6.4 oz (48.7 kg)    Medications:  Allergies as of 09/15/2022       Reactions   Morphine Anaphylaxis, Shortness Of Breath   Penicillins Hives   Tolerated ANCEF on 04/30/20 Has patient had a PCN reaction causing immediate rash, facial/tongue/throat swelling, SOB or lightheadedness with hypotension: Yes Has patient had a PCN reaction causing severe rash involving mucus membranes or skin necrosis: No Has patient had a PCN reaction that required hospitalization No Has patient had a PCN reaction occurring within the last 10 years: No If all of the above answers are "NO", then may proceed with Cephalosporin use.   Tramadol Anaphylaxis   Acetaminophen Other (See Comments)   Alters insulin pump readings   Amitriptyline Other (See Comments)   Severe headache/ out of body feeling   Codeine Other (See Comments)   Severe headaches/ out of body feeling   Ibuprofen Other (See Comments)   Messes up CGM reading on glucose monitor   Krill Oil Diarrhea, Itching, Nausea And Vomiting   Losartan Cough   Propoxyphene Other (See Comments)   Severe headaches / out of body feeling    Shellfish Allergy Diarrhea, Nausea And Vomiting   Statins Other (See Comments)   Muscle pains Other reaction(s): Other   Sulfamethoxazole Other (See Comments)   Mouth ulcers   Sulfites Itching, Other (See Comments)   Mouth ulcers   Fluconazole    Other reaction(s): Contact Dermatitis (intolerance)   Norvasc [amlodipine Besylate] Swelling        Medication List        Accurate as of September 15, 2022 11:59 PM. If you have any questions, ask your nurse or doctor.          Accu-Chek Guide test strip Generic drug: glucose blood 3 (three) times daily.   Alpha-Lipoic Acid 100 MG Caps Take 1 capsule by mouth daily.   Armour Thyroid 60 MG tablet Generic drug: thyroid Take 1 tablet by mouth daily before breakfast.   aspirin EC 325 MG tablet Take 1 tablet (325 mg total) by mouth daily. What changed: when to take this   carvedilol 12.5 MG tablet Commonly known as: COREG TAKE 1 TABLET BY MOUTH TWICE DAILY WITH A MEAL   cetirizine 10 MG tablet Commonly known as: ZYRTEC Take 10 mg by mouth at bedtime.   Dexcom G6 Sensor Misc USE TO CONTINUOUSLY MONITOR BLOOD SUGARS. CHANGE EVERY 10 DAYS   diphenoxylate-atropine 2.5-0.025 MG tablet Commonly known as: LOMOTIL TAKE 1 TABLET BY MOUTH 4 TIMES DAILY AS NEEDED FOR DIARRHEA OR  LOOSE  STOOLS What changed: See the new instructions.  esomeprazole 20 MG capsule Commonly known as: NEXIUM Take 20 mg by mouth every morning.   Fluocinolone Acetonide 0.01 % Oil Place 2 Doses in ear(s) daily.   fosfomycin 3 g Pack Commonly known as: MONUROL Divide packet into 4 equal doses. Take 1/4th packet by mouth with liquid twice daily for 1 day. You will have 1/2 packet left over that you will not take.   hydrALAZINE 25 MG tablet Commonly known as: APRESOLINE TAKE 1 TABLET BY MOUTH THREE TIMES DAILY   isosorbide mononitrate 60 MG 24 hr tablet Commonly known as: IMDUR Take 60 mg by mouth daily.   ketoconazole 2 % shampoo Commonly  known as: NIZORAL Apply 1 Application topically 2 (two) times a week.   lamoTRIgine 150 MG tablet Commonly known as: LAMICTAL Take 150 mg by mouth at bedtime.   Melatonin Gummies 2.5 MG Chew Chew 2.5 mg by mouth at bedtime as needed (sleep).   mirtazapine 30 MG tablet Commonly known as: REMERON Take 30 mg by mouth at bedtime.   NovoLOG 100 UNIT/ML injection Generic drug: insulin aspart Inject 25 Units into the skin continuous.   Premarin vaginal cream Generic drug: conjugated estrogens Place 1 applicator vaginally daily as needed (irritation).   torsemide 20 MG tablet Commonly known as: [name] Take 1 tablet (20 mg total) by mouth at bedtime as needed (for wt gain or leg swelling).   torsemide 20 MG tablet Commonly known as: [name] Take 1 tablet (20 mg total) by mouth daily with breakfast.   valACYclovir 500 MG tablet Commonly known as: VALTREX Take 500 mg by mouth daily as needed (Flair up).        Allergies:  Allergies  Allergen Reactions   Morphine Anaphylaxis and Shortness Of Breath   Penicillins Hives    Tolerated ANCEF on 04/30/20 Has patient had a PCN reaction causing immediate rash, facial/tongue/throat swelling, SOB or lightheadedness with hypotension: Yes Has patient had a PCN reaction causing severe rash involving mucus membranes or skin necrosis: No Has patient had a PCN reaction that required hospitalization No Has patient had a PCN reaction occurring within the last 10 years: No If all of the above answers are "NO", then may proceed with Cephalosporin use.   Tramadol Anaphylaxis   Acetaminophen Other (See Comments)    Alters insulin pump readings    Amitriptyline Other (See Comments)    Severe headache/ out of body feeling   Codeine Other (See Comments)    Severe headaches/ out of body feeling    Ibuprofen Other (See Comments)    Messes up CGM reading on glucose monitor     Krill Oil Diarrhea, Itching and Nausea And Vomiting   Losartan Cough    Propoxyphene Other (See Comments)    Severe headaches / out of body feeling    Shellfish Allergy Diarrhea and Nausea And Vomiting   Statins Other (See Comments)    Muscle pains Other reaction(s): Other   Sulfamethoxazole Other (See Comments)    Mouth ulcers    Sulfites Itching and Other (See Comments)    Mouth ulcers   Fluconazole     Other reaction(s): Contact Dermatitis (intolerance)   Norvasc [Amlodipine Besylate] Swelling    Past Medical History, Surgical history, Social history, and Family History were reviewed and updated.  Review of Systems: Review of Systems  Constitutional:  Negative for chills, fever and malaise/fatigue.  HENT: Negative.    Eyes: Negative.   Respiratory: Negative.    Cardiovascular:  Negative for leg swelling.  Gastrointestinal:  Negative for abdominal pain and diarrhea.  Genitourinary:  Negative for dysuria, flank pain, frequency, hematuria and urgency.  Musculoskeletal:  Positive for joint pain and myalgias.  Skin:  Negative for rash.  Neurological: Negative.   Endo/Heme/Allergies: Negative.   Psychiatric/Behavioral: Negative.     Wt Readings from Last 3 Encounters:  09/15/22 107 lb (48.5 kg)  08/25/22 109 lb (49.4 kg)  08/16/22 107 lb 6.4 oz (48.7 kg)   Vitals:   09/15/22 1311  BP: (!) 135/95  Pulse: 75  Resp: 16  Temp: 97.9 F (36.6 C)  SpO2: 100%     Physical Exam Vitals reviewed.  HENT:     Head: Normocephalic and atraumatic.  Eyes:     Pupils: Pupils are equal, round, and reactive to light.  Cardiovascular:     Rate and Rhythm: Normal rate and regular rhythm.     Heart sounds: Normal heart sounds.  Pulmonary:     Effort: Pulmonary effort is normal.     Breath sounds: Normal breath sounds.  Musculoskeletal:     Cervical back: Normal range of motion.  Lymphadenopathy:     Cervical: No cervical adenopathy.  Skin:    General: Skin is warm and dry.     Findings: No erythema or rash.  Neurological:     Mental Status:  She is alert and oriented to person, place, and time.  Psychiatric:        Behavior: Behavior normal.        Thought Content: Thought content normal.        Judgment: Judgment normal.     Lab Results  Component Value Date   WBC 6.5 09/15/2022   HGB 11.0 (L) 09/15/2022   HCT 36.7 09/15/2022   MCV 95.6 09/15/2022   PLT 342 09/15/2022   Lab Results  Component Value Date   FERRITIN 30 09/15/2022   IRON 66 09/15/2022   TIBC 298 09/15/2022   UIBC 232 09/15/2022   IRONPCTSAT 22 09/15/2022   Lab Results  Component Value Date   RETICCTPCT 0.5 06/14/2022   RBC 3.84 (L) 09/15/2022   No results found for: "KPAFRELGTCHN", "LAMBDASER", "KAPLAMBRATIO" No results found for: "IGGSERUM", "IGA", "IGMSERUM" No results found for: "TOTALPROTELP", "ALBUMINELP", "A1GS", "A2GS", "BETS", "BETA2SER", "GAMS", "MSPIKE", "SPEI"   Chemistry      Component Value Date/Time   NA 135 09/15/2022 1255   NA 138 07/17/2017 1128   K 5.1 09/15/2022 1255   CL 98 09/15/2022 1255   CO2 27 09/15/2022 1255   BUN 51 (H) 09/15/2022 1255   BUN 23 07/17/2017 1128   CREATININE 3.05 (H) 09/15/2022 1255   CREATININE 2.45 (H) 03/03/2022 1129      Component Value Date/Time   CALCIUM 8.8 (L) 09/15/2022 1255   ALKPHOS 81 09/15/2022 1255   AST 16 09/15/2022 1255   ALT 9 09/15/2022 1255   BILITOT 0.4 09/15/2022 1255     Encounter Diagnosis  Name Primary?   Anemia due to stage 3a chronic kidney disease (HCC) Yes   Impression and Plan: Ms. Shover is a very pleasant 67 yo female with multifactorial anemia.    Today review of her labs show an improved Hgb of 11. Hemoccult cards are negative x 3. Calcium has improved with increased oral intake. Creatinine waxes and wanes between 2.3 and 2.5- today it is 3.05. She will continue working very closely with her nephrologist. She will work closely with her endocrinologist regarding her insulin pump. We also discussed a handicap placard-  she uses due to SOB from the anemia. I  completed this form for her.   Disposition: No Aranesp today RTC 3 weeks APP,labs(CBC, CMP, iron, ferritin), aranesp (we will stay at 1 month follow ups per patient request)-[place]  Rushie Chestnut, PA-C 8/23/20242:51 PM

## 2022-09-16 ENCOUNTER — Encounter: Payer: Self-pay | Admitting: Family

## 2022-09-27 ENCOUNTER — Telehealth: Payer: Self-pay | Admitting: *Deleted

## 2022-09-30 DIAGNOSIS — E559 Vitamin D deficiency, unspecified: Secondary | ICD-10-CM | POA: Insufficient documentation

## 2022-10-10 ENCOUNTER — Telehealth: Payer: Self-pay | Admitting: *Deleted

## 2022-10-10 ENCOUNTER — Telehealth: Payer: Self-pay

## 2022-10-10 MED ORDER — HYDRALAZINE HCL 50 MG PO TABS: 50.0000 mg | ORAL_TABLET | Freq: Two times a day (BID) | ORAL | Status: DC

## 2022-10-10 MED ORDER — TORSEMIDE 20 MG PO TABS: 40.0000 mg | ORAL_TABLET | Freq: Every day | ORAL | Status: AC

## 2022-10-10 NOTE — Telephone Encounter (Signed)
Pre-operative Risk Assessment    Patient Name: Brittney Pace  DOB: 06/20/1955 MRN: [id number]   Last OV 02/07/22 with Dr. Jens Som Next OV None   Request for Surgical Clearance    Procedure:   Retinal Surgery(vitrectomy, endolaser, possible peel/gas)  Date of Surgery:  Clearance 11/01/22                                 Surgeon:  Dr. Reginal Lutes, MD Surgeon's Group or Practice Name:  [facility] Phone number:  [phone] Fax number:  [phone] or [phone]   Type of Clearance Requested:   - Medical  - Pharmacy:  Hold Aspirin requesting office asking for 7 day hold   Type of Anesthesia:  MAC   Additional requests/questions:    Wynetta Fines   10/10/2022, 2:53 PM

## 2022-10-10 NOTE — Telephone Encounter (Signed)
Name: [name]  DOB: June 29, 1955  MRN: [id number]  Primary Cardiologist: Olga Millers, MD  Chart reviewed as part of pre-operative protocol coverage. Because of Brittney Pace's past medical history and time since last visit, she will require a follow-up telephone visit in order to better assess preoperative cardiovascular risk.  Pre-op covering staff: - Please schedule appointment and call patient to inform them. If patient already had an upcoming appointment within acceptable timeframe, please add "pre-op clearance" to the appointment notes so provider is aware. - Please contact requesting surgeon's office via preferred method (i.e, phone, fax) to inform them of need for appointment prior to surgery.  Patient can hold ASA x 7 days if no symptoms during tele visit.  [name], PA-C  10/10/2022, 4:43 PM

## 2022-10-10 NOTE — Telephone Encounter (Signed)
  Patient Consent for Virtual Visit    Brittney Pace has provided verbal consent on 10/10/2022 for a virtual visit (video or telephone).   CONSENT FOR VIRTUAL VISIT FOR:  Brittney Pace  By participating in this virtual visit I agree to the following:  I hereby voluntarily request, consent and authorize [facility] and its employed or contracted physicians, physician assistants, nurse practitioners or other licensed health care professionals (the Practitioner), to provide me with telemedicine health care services (the "Services") as deemed necessary by the treating Practitioner. I acknowledge and consent to receive the Services by the Practitioner via telemedicine. I understand that the telemedicine visit will involve communicating with the Practitioner through live audiovisual communication technology and the disclosure of certain medical information by electronic transmission. I acknowledge that I have been given the opportunity to request an in-person assessment or other available alternative prior to the telemedicine visit and am voluntarily participating in the telemedicine visit.  I understand that I have the right to withhold or withdraw my consent to the use of telemedicine in the course of my care at any time, without affecting my right to future care or treatment, and that the Practitioner or I may terminate the telemedicine visit at any time. I understand that I have the right to inspect all information obtained and/or recorded in the course of the telemedicine visit and may receive copies of available information for a reasonable fee.  I understand that some of the potential risks of receiving the Services via telemedicine include:  Delay or interruption in medical evaluation due to technological equipment failure or disruption; Information transmitted may not be sufficient (e.g. poor resolution of images) to allow for appropriate medical decision making by the Practitioner;  and/or  In rare instances, security protocols could fail, causing a breach of personal health information.  Furthermore, I acknowledge that it is my responsibility to provide information about my medical history, conditions and care that is complete and accurate to the best of my ability. I acknowledge that Practitioner's advice, recommendations, and/or decision may be based on factors not within their control, such as incomplete or inaccurate data provided by me or distortions of diagnostic images or specimens that may result from electronic transmissions. I understand that the practice of medicine is not an exact science and that Practitioner makes no warranties or guarantees regarding treatment outcomes. I acknowledge that a copy of this consent can be made available to me via my patient portal [facility] MyChart), or I can request a printed copy by calling the office of [facility].    I understand that my insurance will be billed for this visit.   I have read or had this consent read to me. I understand the contents of this consent, which adequately explains the benefits and risks of the Services being provided via telemedicine.  I have been provided ample opportunity to ask questions regarding this consent and the Services and have had my questions answered to my satisfaction. I give my informed consent for the services to be provided through the use of telemedicine in my medical care

## 2022-10-13 ENCOUNTER — Telehealth: Payer: Self-pay | Admitting: Cardiology

## 2022-10-13 NOTE — Telephone Encounter (Signed)
Patient states picked up something heavy and had a bleed in her eye approx. end of June. This was after her cataract her surgery. She states "today I am realizing I have a history of stroke".  She states this was from when worked for [employer].  [place] is all over the place.  She states she think she had a stroke back in June from lifting something heavy.  She went to the eye doctor for the bleed in the eye but no other issues.  She states she believes she had a stroke because it is more difficult to walk around and having Heart beat really loud in her Right ear to the point she couldn't sleep.  She states the HR is her normal low then states it was high.    She sated talked with kidney doctor and increased hydralazine to 50 mg BID.  They also restarted isosorbide mononitrate 60 mg daily and increased torsemide to 40 mg day. She states this has helped with her heart rate.  She has no active symptoms, nothing today, no Signs of stroke or chest pain.  All symptoms have been since June.  She is asked to call her PCP and discuss with them if she needs to see neurology for any deficits.

## 2022-10-13 NOTE — Telephone Encounter (Signed)
STAT if HR is under 50 or over 120 (normal HR is 60-100 beats per minute)  What is your heart rate?    9/18, 4:00 am - 122/66 HR 69  Do you have a log of your heart rate readings (document readings)?     Patient stated her HR has been hovering around 60-61  Do you have any other symptoms?    Patient stated since she had eye surgery in June she has been having increased HR, walking problems and she believe it may be a stroke.  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  Patient stated her heart beat has been pounding in her ear and she has been unable to get sleep.  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

## 2022-10-14 ENCOUNTER — Other Ambulatory Visit: Payer: Self-pay

## 2022-10-14 ENCOUNTER — Emergency Department (HOSPITAL_COMMUNITY): Payer: Medicare Other

## 2022-10-14 ENCOUNTER — Emergency Department (HOSPITAL_COMMUNITY)
Admission: EM | Admit: 2022-10-14 | Discharge: 2022-10-14 | Discharge status: Against Medical Advice | Payer: Medicare Other | Attending: Emergency Medicine | Admitting: Emergency Medicine

## 2022-10-14 DIAGNOSIS — R531 Weakness: Secondary | ICD-10-CM | POA: Diagnosis present

## 2022-10-14 DIAGNOSIS — Z8673 Personal history of transient ischemic attack (TIA), and cerebral infarction without residual deficits: Secondary | ICD-10-CM | POA: Diagnosis not present

## 2022-10-14 DIAGNOSIS — R519 Headache, unspecified: Secondary | ICD-10-CM | POA: Diagnosis not present

## 2022-10-14 DIAGNOSIS — N189 Chronic kidney disease, unspecified: Secondary | ICD-10-CM | POA: Insufficient documentation

## 2022-10-14 DIAGNOSIS — Z951 Presence of aortocoronary bypass graft: Secondary | ICD-10-CM | POA: Diagnosis not present

## 2022-10-14 DIAGNOSIS — Z5321 Procedure and treatment not carried out due to patient leaving prior to being seen by health care provider: Secondary | ICD-10-CM | POA: Insufficient documentation

## 2022-10-14 LAB — COMPREHENSIVE METABOLIC PANEL
ALT: 15 U/L (ref 0–44)
AST: 20 U/L (ref 15–41)
Albumin: 3.3 g/dL — ABNORMAL LOW (ref 3.5–5.0)
Alkaline Phosphatase: 77 U/L (ref 38–126)
Anion gap: 10 (ref 5–15)
BUN: 54 mg/dL — ABNORMAL HIGH (ref 8–23)
CO2: 22 mmol/L (ref 22–32)
Calcium: 8.7 mg/dL — ABNORMAL LOW (ref 8.9–10.3)
Chloride: 102 mmol/L (ref 98–111)
Creatinine, Ser: 2.99 mg/dL — ABNORMAL HIGH (ref 0.44–1.00)
GFR, Estimated: 16 mL/min — ABNORMAL LOW (ref >60)
Glucose, Bld: 122 mg/dL — ABNORMAL HIGH (ref 70–99)
Potassium: 3.6 mmol/L (ref 3.5–5.1)
Sodium: 134 mmol/L — ABNORMAL LOW (ref 135–145)
Total Bilirubin: 0.5 mg/dL (ref 0.3–1.2)
Total Protein: 6.4 g/dL — ABNORMAL LOW (ref 6.5–8.1)

## 2022-10-14 LAB — URINALYSIS, ROUTINE W REFLEX MICROSCOPIC
Bacteria, UA: NONE SEEN
Bilirubin Urine: NEGATIVE
Glucose, UA: 50 mg/dL — AB
Hgb urine dipstick: NEGATIVE
Ketones, ur: NEGATIVE mg/dL
Nitrite: NEGATIVE
Protein, ur: 100 mg/dL — AB
Specific Gravity, Urine: 1.009 (ref 1.005–1.030)
pH: 5 (ref 5.0–8.0)

## 2022-10-14 LAB — CBC
HCT: 35.1 % — ABNORMAL LOW (ref 36.0–46.0)
Hemoglobin: 11.3 g/dL — ABNORMAL LOW (ref 12.0–15.0)
MCH: 29.7 pg (ref 26.0–34.0)
MCHC: 32.2 g/dL (ref 30.0–36.0)
MCV: 92.4 fL (ref 80.0–100.0)
Platelets: 261 10*3/uL (ref 150–400)
RBC: 3.8 MIL/uL — ABNORMAL LOW (ref 3.87–5.11)
RDW: 13.2 % (ref 11.5–15.5)
WBC: 8.7 10*3/uL (ref 4.0–10.5)
nRBC: 0 % (ref 0.0–0.2)

## 2022-10-14 LAB — CBG MONITORING, ED: Glucose-Capillary: 114 mg/dL — ABNORMAL HIGH (ref 70–99)

## 2022-10-14 NOTE — ED Notes (Signed)
No answer when called

## 2022-10-14 NOTE — ED Triage Notes (Signed)
Pt arrived via POV. C/o bilat leg weakness for several days that got worse yesterday, knees have been buckling. Pt states they have had recent muscle atrophy  AOx4

## 2022-10-14 NOTE — ED Provider Triage Note (Signed)
Emergency Medicine Provider Triage Evaluation Note  Brittney Pace , a 67 y.o. female  was evaluated in triage.  Pt complains of bilateral leg weakness for several days, worse since yesterday. Hx of same, including multiple strokes and CABG in 1999. Also has headache that is worse than normal  Hx of CKD, anemia due to chronic disease, hyponatremia Felt like her sodium might be low  Review of Systems  Positive: Weakness in both legs, headche Negative: Numbness, CP, arm weakness  Physical Exam  BP (!) 132/57 (BP Location: Left Arm)   Pulse 68   Temp 98 F (36.7 C) (Oral)   Resp 16   Ht 4\' 11"  (1.499 m)   Wt 46.7 kg   SpO2 98%   BMI 20.80 kg/m  Gen:   Awake, no distress   Resp:  Normal effort  MSK:   Moves extremities without difficulty  Other:  No facial droop or slurred speech  Medical Decision Making  Medically screening exam initiated at 4:14 PM.  Appropriate orders placed.  Brittney Pace was informed that the remainder of the evaluation will be completed by another provider, this initial triage assessment does not replace that evaluation, and the importance of remaining in the ED until their evaluation is complete.     Su Monks, PA-C 10/14/22 1614

## 2022-10-25 ENCOUNTER — Ambulatory Visit: Payer: Medicare Other | Attending: Nurse Practitioner

## 2022-10-25 ENCOUNTER — Telehealth: Payer: Self-pay | Admitting: Cardiology

## 2022-10-25 DIAGNOSIS — Z01810 Encounter for preprocedural cardiovascular examination: Secondary | ICD-10-CM

## 2022-10-25 NOTE — Telephone Encounter (Signed)
Caller ([facility]) stated they will need a copy of patient's 10/1 visit notes faxed to them at fax# [phone].

## 2022-10-25 NOTE — Progress Notes (Signed)
Virtual Visit via Telephone Note   Because of Brittney Pace's co-morbid illnesses, she is at least at moderate risk for complications without adequate follow up.  This format is felt to be most appropriate for this patient at this time.  The patient did not have access to video technology/had technical difficulties with video requiring transitioning to audio format only (telephone).  All issues noted in this document were discussed and addressed.  No physical exam could be performed with this format.  Please refer to the patient's chart for her consent to telehealth for [facility].  Evaluation Performed:  Preoperative cardiovascular risk assessment _____________   Date:  10/25/2022   Patient ID:  Brittney Pace, DOB Apr 04, 1955, MRN [id number] Patient Location:  Home Provider location:   Office  Primary Care Provider:  Josph Macho, MD Primary Cardiologist:  Olga Millers, MD  Chief Complaint / Patient Profile   67 y.o. y/o female with a h/o CAD s/p MI 1999 with CABG x 2, HLD, HTN, CVA, DM type I, CKD stage III who is pending vitrectomy and presents today for telephonic preoperative cardiovascular risk assessment.  History of Present Illness    Brittney Pace is a 67 y.o. female who presents via audio/video conferencing for a telehealth visit today.  Pt was last seen in cardiology clinic on 02/07/2022 by Dr. Jens Som.  At that time Brittney Pace was doing well with some dyspnea and cough with routine activities.  Lisinopril was discontinued and patient was started on Avapro and is now on coreg. She went to the ED recently for weakness but left before being seen.  The patient is now pending procedure as outlined above. Since her last visit, she has been experiencing weakness in her legs and is currently using a walker.  She is very limited in her ADLs but does have a functional status of 4 metabolic equivalents.  She denies chest pain, shortness of breath, lower  extremity edema, fatigue, palpitations, melena, hematuria, hemoptysis, diaphoresis, weakness, presyncope, syncope, orthopnea, and PND.    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   Family history of adverse reaction to anesthesia    mother: " while she was under she stopped breathing."   Fibromyalgia    Gastroparesis    GERD (gastroesophageal reflux disease)    Headache    migraines   Hypothyroidism    IBS (irritable bowel syndrome)    Idiopathic edema    Iron deficiency anemia 09/04/2019   Lyme disease    Mitral valve prolapse    Myocardial infarction (HCC)    1 major in 1999 and 2 minor " small vessel disease."   Osteoporosis    Peripheral neuropathy    Peripheral vascular disease (HCC)    Sinus disorder    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" " the second stroke was a lacunar    Past Surgical History:  Procedure Laterality Date   ABDOMINAL HYSTERECTOMY     APPENDECTOMY  BREAST SURGERY     B/L biopsy and lumpectomy    CARDIAC CATHETERIZATION     CARPAL TUNNEL RELEASE     CATARACT EXTRACTION W/ INTRAOCULAR LENS IMPLANT     right eye   COLONOSCOPY W/ BIOPSIES AND POLYPECTOMY     CORONARY ARTERY BYPASS GRAFT     coronary artery stents     at LAD and [place]   ECTOPIC PREGNANCY SURGERY     NASAL SEPTUM SURGERY     OPEN REDUCTION INTERNAL FIXATION (ORIF) DISTAL RADIAL FRACTURE Right 05/06/2015   Procedure: OPEN REDUCTION INTERNAL FIXATION (ORIF) RIGHT DISTAL RADIAL FRACTURE AND REPAIRS AS NEEDED;  Surgeon: Bradly Bienenstock, MD;  Location: [facility] OR;  Service: Orthopedics;   Laterality: Right;   ORIF HUMERUS FRACTURE Right 04/30/2020   Procedure: OPEN REDUCTION INTERNAL FIXATION (ORIF) PROXIMAL HUMERUS FRACTURE;  Surgeon: Francena Hanly, MD;  Location: [facility] ORS;  Service: Orthopedics;  Laterality: Right;    ORIF WRIST FRACTURE Left 11/05/2014   ORIF WRIST FRACTURE Left 11/05/2014   Procedure: OPEN REDUCTION INTERNAL FIXATION (ORIF) LEFT WRIST FRACTURE AND REPAIR AS INDICATED;  Surgeon: Bradly Bienenstock, MD;  Location: [facility] OR;  Service: Orthopedics;  Laterality: Left;   TRIGGER FINGER RELEASE      Allergies  Allergies  Allergen Reactions   Morphine Anaphylaxis and Shortness Of Breath   Penicillins Hives    Tolerated ANCEF on 04/30/20 Has patient had a PCN reaction causing immediate rash, facial/tongue/throat swelling, SOB or lightheadedness with hypotension: Yes Has patient had a PCN reaction causing severe rash involving mucus membranes or skin necrosis: No Has patient had a PCN reaction that required hospitalization No Has patient had a PCN reaction occurring within the last 10 years: No If all of the above answers are "NO", then may proceed with Cephalosporin use.   Tramadol Anaphylaxis   Acetaminophen Other (See Comments)    Alters insulin pump readings    Amitriptyline Other (See Comments)    Severe headache/ out of body feeling   Codeine Other (See Comments)    Severe headaches/ out of body feeling    Ibuprofen Other (See Comments)    Messes up CGM reading on glucose monitor     Krill Oil Diarrhea, Itching and Nausea And Vomiting   Losartan Cough   Propoxyphene Other (See Comments)    Severe headaches / out of body feeling    Shellfish Allergy Diarrhea and Nausea And Vomiting   Statins Other (See Comments)    Muscle pains Other reaction(s): Other   Sulfamethoxazole Other (See Comments)    Mouth ulcers    Sulfites Itching and Other (See Comments)    Mouth ulcers   Fluconazole     Other reaction(s): Contact Dermatitis (intolerance)    Norvasc [Amlodipine Besylate] Swelling    Home Medications    Prior to Admission medications   Medication Sig Start Date End Date Taking? Authorizing Provider  ACCU-CHEK GUIDE test strip 3 (three) times daily. 05/03/21   [provider]  Alpha-Lipoic Acid 100 MG CAPS Take 1 capsule by mouth daily. 05/17/22   [provider]  aspirin EC 325 MG tablet Take 1 tablet (325 mg total) by mouth daily. Patient taking differently: Take 325 mg by mouth at bedtime. 03/06/19   Lewayne Bunting, MD  carvedilol (COREG) 12.5 MG tablet TAKE 1 TABLET BY MOUTH TWICE DAILY WITH A MEAL 04/26/22   Lewayne Bunting, MD  cetirizine (ZYRTEC) 10 MG tablet Take 10 mg by mouth at bedtime. 03/26/21  [provider]  Continuous Blood Gluc Sensor (DEXCOM G6 SENSOR) MISC USE TO CONTINUOUSLY MONITOR BLOOD SUGARS. CHANGE EVERY 10 DAYS 04/02/19   [provider]  diphenoxylate-atropine (LOMOTIL) 2.5-0.025 MG tablet TAKE 1 TABLET BY MOUTH 4 TIMES DAILY AS NEEDED FOR DIARRHEA OR  LOOSE  STOOLS Patient taking differently: Take 1 tablet by mouth 4 (four) times daily as needed for diarrhea or loose stools. 12/24/20   Josph Macho, MD  esomeprazole (NEXIUM) 20 MG capsule Take 20 mg by mouth every morning.     [provider]  Fluocinolone Acetonide 0.01 % OIL Place 2 Doses in ear(s) daily.    [provider]  fosfomycin (MONUROL) 3 g PACK Divide packet into 4 equal doses. Take 1/4th packet by mouth with liquid twice daily for 1 day. You will have 1/2 packet left over that you will not take. 07/11/22   Rushie Chestnut, PA-C  hydrALAZINE (APRESOLINE) 50 MG tablet Take 1 tablet (50 mg total) by mouth in the morning and at bedtime. 10/10/22   [name], PA-C  isosorbide mononitrate (IMDUR) 60 MG 24 hr tablet Take 60 mg by mouth daily. 06/16/22   [provider]  ketoconazole (NIZORAL) 2 % shampoo Apply 1 Application topically 2 (two) times a week.    [provider]   lamoTRIgine (LAMICTAL) 150 MG tablet Take 150 mg by mouth at bedtime. 01/25/22   [provider]  Melatonin Gummies 2.5 MG CHEW Chew 2.5 mg by mouth at bedtime as needed (sleep).    [provider]  mirtazapine (REMERON) 30 MG tablet Take 30 mg by mouth at bedtime. 10/15/21   [provider]  NOVOLOG 100 UNIT/ML injection Inject 25 Units into the skin continuous. 02/21/22   [provider]  PREMARIN vaginal cream Place 1 applicator vaginally daily as needed (irritation). 03/24/21   [provider]  thyroid (ARMOUR THYROID) 60 MG tablet Take 1 tablet by mouth daily before breakfast. 07/30/21   [provider]  torsemide ([name]) 20 MG tablet Take 2 tablets (40 mg total) by mouth daily. 10/10/22   [name], PA-C  valACYclovir (VALTREX) 500 MG tablet Take 500 mg by mouth daily as needed (Flair up).    [provider]    Physical Exam    Vital Signs:  Laketia Berkowitz does not have vital signs available for review today.  Given telephonic nature of communication, physical exam is limited. AAOx3. NAD. Normal affect.  Speech and respirations are unlabored.  Accessory Clinical Findings    None  Assessment & Plan    1.  Preoperative Cardiovascular Risk Assessment: -Patient's RCRI score is 11%  The patient affirms she has been doing well without any new cardiac symptoms. They are able to achieve 4 METS without cardiac limitations. Therefore, based on ACC/AHA guidelines, the patient would be at acceptable risk for the planned procedure without further cardiovascular testing. The patient was advised that if she develops new symptoms prior to surgery to contact our office to arrange for a follow-up visit, and she verbalized understanding.   The patient was advised that if she develops new symptoms prior to surgery to contact our office to arrange for a follow-up visit, and she verbalized understanding.   Patient can hold ASA x 7 days.  Patient was hesitant and has elected not to hold aspirin and was advised to follow-up with her surgeon for clarification and further guidance regarding holding aspirin.  A copy of this note will  be routed to requesting surgeon.  Time:   Today, I have spent 8 minutes with the patient with telehealth technology discussing medical history, symptoms, and management plan.     Napoleon Form, Leodis Rains, NP  10/25/2022, 7:20 AM

## 2022-10-26 NOTE — Telephone Encounter (Signed)
I will re-fax  clearance notes from Robin Searing, NP.

## 2022-11-07 ENCOUNTER — Encounter: Payer: Self-pay | Admitting: Family

## 2022-11-15 DIAGNOSIS — M25552 Pain in left hip: Secondary | ICD-10-CM | POA: Insufficient documentation

## 2022-11-29 ENCOUNTER — Inpatient Hospital Stay: Payer: Medicare Other

## 2022-11-29 ENCOUNTER — Inpatient Hospital Stay: Payer: Medicare Other | Admitting: Medical Oncology

## 2022-11-30 ENCOUNTER — Inpatient Hospital Stay (HOSPITAL_BASED_OUTPATIENT_CLINIC_OR_DEPARTMENT_OTHER): Payer: Medicare Other | Attending: Hematology & Oncology | Admitting: Medical Oncology

## 2022-11-30 ENCOUNTER — Inpatient Hospital Stay: Payer: Medicare Other | Attending: Hematology & Oncology

## 2022-11-30 ENCOUNTER — Other Ambulatory Visit: Payer: Self-pay

## 2022-11-30 ENCOUNTER — Encounter: Payer: Self-pay | Admitting: Medical Oncology

## 2022-11-30 VITALS — BP 166/80 | HR 60 | Temp 97.8°F | Resp 18 | Ht 59.0 in | Wt 112.1 lb

## 2022-11-30 DIAGNOSIS — N1831 Chronic kidney disease, stage 3a: Secondary | ICD-10-CM

## 2022-11-30 DIAGNOSIS — Z794 Long term (current) use of insulin: Secondary | ICD-10-CM | POA: Insufficient documentation

## 2022-11-30 DIAGNOSIS — E1022 Type 1 diabetes mellitus with diabetic chronic kidney disease: Secondary | ICD-10-CM | POA: Insufficient documentation

## 2022-11-30 DIAGNOSIS — N184 Chronic kidney disease, stage 4 (severe): Secondary | ICD-10-CM | POA: Diagnosis present

## 2022-11-30 DIAGNOSIS — Z79899 Other long term (current) drug therapy: Secondary | ICD-10-CM | POA: Diagnosis not present

## 2022-11-30 DIAGNOSIS — R5383 Other fatigue: Secondary | ICD-10-CM

## 2022-11-30 DIAGNOSIS — I129 Hypertensive chronic kidney disease with stage 1 through stage 4 chronic kidney disease, or unspecified chronic kidney disease: Secondary | ICD-10-CM | POA: Insufficient documentation

## 2022-11-30 DIAGNOSIS — D631 Anemia in chronic kidney disease: Secondary | ICD-10-CM

## 2022-11-30 LAB — CMP (CANCER CENTER ONLY)
ALT: 10 U/L (ref 0–44)
AST: 16 U/L (ref 15–41)
Albumin: 3.7 g/dL (ref 3.5–5.0)
Alkaline Phosphatase: 78 U/L (ref 38–126)
Anion gap: 8 (ref 5–15)
BUN: 38 mg/dL — ABNORMAL HIGH (ref 8–23)
CO2: 25 mmol/L (ref 22–32)
Calcium: 8.6 mg/dL — ABNORMAL LOW (ref 8.9–10.3)
Chloride: 102 mmol/L (ref 98–111)
Creatinine: 2.52 mg/dL — ABNORMAL HIGH (ref 0.44–1.00)
GFR, Estimated: 20 mL/min — ABNORMAL LOW (ref >60)
Glucose, Bld: 166 mg/dL — ABNORMAL HIGH (ref 70–99)
Potassium: 4.8 mmol/L (ref 3.5–5.1)
Sodium: 135 mmol/L (ref 135–145)
Total Bilirubin: 0.3 mg/dL (ref <1.2)
Total Protein: 5.9 g/dL — ABNORMAL LOW (ref 6.5–8.1)

## 2022-11-30 LAB — CBC WITH DIFFERENTIAL (CANCER CENTER ONLY)
Abs Immature Granulocytes: 0.02 10*3/uL (ref 0.00–0.07)
Basophils Absolute: 0.1 10*3/uL (ref 0.0–0.1)
Basophils Relative: 1 %
Eosinophils Absolute: 0.8 10*3/uL — ABNORMAL HIGH (ref 0.0–0.5)
Eosinophils Relative: 13 %
HCT: 26.9 % — ABNORMAL LOW (ref 36.0–46.0)
Hemoglobin: 8.8 g/dL — ABNORMAL LOW (ref 12.0–15.0)
Immature Granulocytes: 0 %
Lymphocytes Relative: 23 %
Lymphs Abs: 1.4 10*3/uL (ref 0.7–4.0)
MCH: 28.9 pg (ref 26.0–34.0)
MCHC: 32.7 g/dL (ref 30.0–36.0)
MCV: 88.5 fL (ref 80.0–100.0)
Monocytes Absolute: 0.7 10*3/uL (ref 0.1–1.0)
Monocytes Relative: 11 %
Neutro Abs: 3.3 10*3/uL (ref 1.7–7.7)
Neutrophils Relative %: 52 %
Platelet Count: 258 10*3/uL (ref 150–400)
RBC: 3.04 MIL/uL — ABNORMAL LOW (ref 3.87–5.11)
RDW: 13.8 % (ref 11.5–15.5)
WBC Count: 6.3 10*3/uL (ref 4.0–10.5)
nRBC: 0 % (ref 0.0–0.2)

## 2022-11-30 NOTE — Progress Notes (Signed)
Hematology and Oncology Follow Up Visit  Brittney Pace [id number] [birth year] 67 y.o. 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   Today she is better than she was at her last visit but still struggling with multiple aspects of her health. Recently her vision has been off and was informed that she has high pressure in her eyes. She has been treated for this and symptoms have improved.   No fevers, night sweats, unintentional weight loss, bleeding episodes or significant bruising episodes.   Currently, I would say that her performance status is probably ECOG 2-3.  Wt Readings from Last 3 Encounters:  11/30/22 112 lb 1.9 oz (50.9 kg)  10/14/22 103 lb (46.7 kg)  09/15/22 107 lb (48.5 kg)    Medications:  Allergies as of 11/30/2022       Reactions   Morphine Anaphylaxis, Shortness Of Breath   Penicillins Hives   Tolerated ANCEF on 04/30/20 Has patient had a PCN reaction causing immediate rash, facial/tongue/throat swelling, SOB or lightheadedness with hypotension: Yes Has patient had a PCN reaction causing severe rash involving mucus membranes or skin necrosis: No Has patient had a PCN reaction that required hospitalization No Has patient had a PCN reaction occurring within the last 10 years: No If all of the above answers are "NO", then may proceed with Cephalosporin use.   Tramadol Anaphylaxis   Acetaminophen Other (See Comments)   Alters insulin pump readings   Amitriptyline Other (See Comments)   Severe headache/ out of body feeling   Codeine Other (See Comments)   Severe headaches/ out of body feeling   Ibuprofen Other (See Comments)   Messes up CGM reading on glucose monitor   Krill Oil Diarrhea, Itching, Nausea And Vomiting   Losartan Cough    Propoxyphene Other (See Comments)   Severe headaches / out of body feeling   Shellfish Allergy Diarrhea, Nausea And Vomiting   Statins Other (See Comments)   Muscle pains Other reaction(s): Other   Sulfamethoxazole Other (See Comments)   Mouth ulcers   Sulfites Itching, Other (See Comments)   Mouth ulcers   Fluconazole    Other reaction(s): Contact Dermatitis (intolerance)   Norvasc [amlodipine Besylate] Swelling        Medication List        Accurate as of November 30, 2022  3:37 PM. If you have any questions, ask your nurse or doctor.          Accu-Chek Guide test strip Generic drug: glucose blood 3 (three) times daily.   Alpha-Lipoic Acid 100 MG Caps Take 1 capsule by mouth daily.   Armour Thyroid 60 MG tablet Generic drug: thyroid Take 1 tablet by mouth daily before breakfast.   aspirin EC 325 MG tablet Take 1 tablet (325 mg total) by mouth daily. What changed: when to take this   carvedilol 12.5 MG tablet Commonly known as: COREG TAKE 1 TABLET BY MOUTH TWICE DAILY WITH A MEAL   cetirizine 10 MG tablet Commonly known as: ZYRTEC Take 10 mg by mouth at bedtime.   Dexcom G7 Sensor Misc Inject 1 each into the skin as directed. Every 10 days change it. What changed: Another medication with the same name was removed. Continue taking this medication, and  follow the directions you see here. Changed by: Rushie Chestnut   diphenoxylate-atropine 2.5-0.025 MG tablet Commonly known as: LOMOTIL TAKE 1 TABLET BY MOUTH 4 TIMES DAILY AS NEEDED FOR DIARRHEA OR  LOOSE  STOOLS What changed: See the new instructions.   esomeprazole 20 MG capsule Commonly known as: NEXIUM Take 20 mg by mouth every morning.   Fluocinolone Acetonide 0.01 % Oil Place 2 Doses in ear(s) daily.   fosfomycin 3 g Pack Commonly known as: MONUROL Divide packet into 4 equal doses. Take 1/4th packet by mouth with liquid twice daily for 1 day. You will have 1/2 packet left over that you will  not take.   hydrALAZINE 50 MG tablet Commonly known as: APRESOLINE Take 1 tablet (50 mg total) by mouth in the morning and at bedtime.   isosorbide mononitrate 60 MG 24 hr tablet Commonly known as: IMDUR Take 60 mg by mouth daily.   ketoconazole 2 % shampoo Commonly known as: NIZORAL Apply 1 Application topically 2 (two) times a week.   lamoTRIgine 150 MG tablet Commonly known as: LAMICTAL Take 150 mg by mouth at bedtime.   Melatonin Gummies 2.5 MG Chew Chew 2.5 mg by mouth at bedtime as needed (sleep).   mirtazapine 30 MG tablet Commonly known as: REMERON Take 30 mg by mouth at bedtime.   NovoLOG 100 UNIT/ML injection Generic drug: insulin aspart Inject 25 Units into the skin continuous.   Premarin vaginal cream Generic drug: conjugated estrogens Place 1 applicator vaginally daily as needed (irritation).   torsemide 20 MG tablet Commonly known as: [name] Take 2 tablets (40 mg total) by mouth daily.   valACYclovir 500 MG tablet Commonly known as: VALTREX Take 500 mg by mouth daily as needed (Flair up).        Allergies:  Allergies  Allergen Reactions   Morphine Anaphylaxis and Shortness Of Breath   Penicillins Hives    Tolerated ANCEF on 04/30/20 Has patient had a PCN reaction causing immediate rash, facial/tongue/throat swelling, SOB or lightheadedness with hypotension: Yes Has patient had a PCN reaction causing severe rash involving mucus membranes or skin necrosis: No Has patient had a PCN reaction that required hospitalization No Has patient had a PCN reaction occurring within the last 10 years: No If all of the above answers are "NO", then may proceed with Cephalosporin use.   Tramadol Anaphylaxis   Acetaminophen Other (See Comments)    Alters insulin pump readings    Amitriptyline Other (See Comments)    Severe headache/ out of body feeling   Codeine Other (See Comments)    Severe headaches/ out of body feeling    Ibuprofen Other (See Comments)     Messes up CGM reading on glucose monitor     Krill Oil Diarrhea, Itching and Nausea And Vomiting   Losartan Cough   Propoxyphene Other (See Comments)    Severe headaches / out of body feeling    Shellfish Allergy Diarrhea and Nausea And Vomiting   Statins Other (See Comments)    Muscle pains Other reaction(s): Other   Sulfamethoxazole Other (See Comments)    Mouth ulcers    Sulfites Itching and Other (See Comments)    Mouth ulcers   Fluconazole     Other reaction(s): Contact Dermatitis (intolerance)   Norvasc [Amlodipine Besylate] Swelling    Past Medical History, Surgical history, Social history, and Family History were reviewed and updated.  Review of Systems: Review of Systems  Constitutional:  Negative for chills, fever and  malaise/fatigue.  HENT: Negative.    Eyes: Negative.   Respiratory: Negative.    Cardiovascular:  Negative for leg swelling.  Gastrointestinal:  Negative for abdominal pain and diarrhea.  Genitourinary:  Negative for dysuria, flank pain, frequency, hematuria and urgency.  Musculoskeletal:  Positive for joint pain and myalgias.  Skin:  Negative for rash.  Neurological: Negative.   Endo/Heme/Allergies: Negative.   Psychiatric/Behavioral: Negative.     Wt Readings from Last 3 Encounters:  11/30/22 112 lb 1.9 oz (50.9 kg)  10/14/22 103 lb (46.7 kg)  09/15/22 107 lb (48.5 kg)   Vitals:   11/30/22 1457 11/30/22 1532  BP: (!) 158/83 (!) 166/80  Pulse: 60   Resp: 18   Temp: 97.8 F (36.6 C)   SpO2: 99%      Physical Exam Vitals reviewed.  HENT:     Head: Normocephalic and atraumatic.  Eyes:     Pupils: Pupils are equal, round, and reactive to light.  Cardiovascular:     Rate and Rhythm: Normal rate and regular rhythm.     Heart sounds: Normal heart sounds.  Pulmonary:     Effort: Pulmonary effort is normal.     Breath sounds: Normal breath sounds.  Musculoskeletal:     Cervical back: Normal range of motion.  Lymphadenopathy:      Cervical: No cervical adenopathy.  Skin:    General: Skin is warm and dry.     Findings: No erythema or rash.  Neurological:     Mental Status: She is alert and oriented to person, place, and time.  Psychiatric:        Behavior: Behavior normal.        Thought Content: Thought content normal.        Judgment: Judgment normal.     Lab Results  Component Value Date   WBC 6.3 11/30/2022   HGB 8.8 (L) 11/30/2022   HCT 26.9 (L) 11/30/2022   MCV 88.5 11/30/2022   PLT 258 11/30/2022   Lab Results  Component Value Date   FERRITIN 30 09/15/2022   IRON 66 09/15/2022   TIBC 298 09/15/2022   UIBC 232 09/15/2022   IRONPCTSAT 22 09/15/2022   Lab Results  Component Value Date   RETICCTPCT 0.5 06/14/2022   RBC 3.04 (L) 11/30/2022   No results found for: "KPAFRELGTCHN", "LAMBDASER", "KAPLAMBRATIO" No results found for: "IGGSERUM", "IGA", "IGMSERUM" No results found for: "TOTALPROTELP", "ALBUMINELP", "A1GS", "A2GS", "BETS", "BETA2SER", "GAMS", "MSPIKE", "SPEI"   Chemistry      Component Value Date/Time   NA 135 11/30/2022 1413   NA 138 07/17/2017 1128   K 4.8 11/30/2022 1413   CL 102 11/30/2022 1413   CO2 25 11/30/2022 1413   BUN 38 (H) 11/30/2022 1413   BUN 23 07/17/2017 1128   CREATININE 2.52 (H) 11/30/2022 1413   CREATININE 2.45 (H) 03/03/2022 1129      Component Value Date/Time   CALCIUM 8.6 (L) 11/30/2022 1413   ALKPHOS 78 11/30/2022 1413   AST 16 11/30/2022 1413   ALT 10 11/30/2022 1413   BILITOT 0.3 11/30/2022 1413     Encounter Diagnoses  Name Primary?   Anemia due to stage 3a chronic kidney disease (HCC) Yes   Other fatigue     Impression and Plan: Brittney Pace is a very pleasant 67 yo female with multifactorial anemia.    Today we discussed her elevated blood pressure and anemia. Her elevated blood pressure readings and concurrent  kidney disease are an issues. She needs  to follow up with nephrology and cardiology this week to get BP under better  control.  Disposition: No Aranesp today due to BP RTC 1 weeks APP,labs (CBC w/,CMP, retic, B12)  Rushie Chestnut, PA-C 11/6/20243:37 PM

## 2022-12-07 ENCOUNTER — Inpatient Hospital Stay: Payer: Medicare Other

## 2022-12-07 ENCOUNTER — Other Ambulatory Visit: Payer: Self-pay

## 2022-12-07 ENCOUNTER — Ambulatory Visit: Payer: Medicare Other

## 2022-12-07 ENCOUNTER — Encounter: Payer: Self-pay | Admitting: Medical Oncology

## 2022-12-07 ENCOUNTER — Inpatient Hospital Stay (HOSPITAL_BASED_OUTPATIENT_CLINIC_OR_DEPARTMENT_OTHER): Payer: Medicare Other | Admitting: Medical Oncology

## 2022-12-07 VITALS — BP 158/56 | HR 64 | Temp 98.4°F | Resp 19 | Ht 59.0 in | Wt 109.1 lb

## 2022-12-07 DIAGNOSIS — E639 Nutritional deficiency, unspecified: Secondary | ICD-10-CM | POA: Diagnosis not present

## 2022-12-07 DIAGNOSIS — N1831 Chronic kidney disease, stage 3a: Secondary | ICD-10-CM

## 2022-12-07 DIAGNOSIS — E1022 Type 1 diabetes mellitus with diabetic chronic kidney disease: Secondary | ICD-10-CM | POA: Diagnosis not present

## 2022-12-07 DIAGNOSIS — N183 Chronic kidney disease, stage 3 unspecified: Secondary | ICD-10-CM | POA: Diagnosis not present

## 2022-12-07 DIAGNOSIS — D509 Iron deficiency anemia, unspecified: Secondary | ICD-10-CM | POA: Diagnosis not present

## 2022-12-07 DIAGNOSIS — R5383 Other fatigue: Secondary | ICD-10-CM

## 2022-12-07 DIAGNOSIS — D631 Anemia in chronic kidney disease: Secondary | ICD-10-CM

## 2022-12-07 DIAGNOSIS — N184 Chronic kidney disease, stage 4 (severe): Secondary | ICD-10-CM | POA: Diagnosis not present

## 2022-12-07 LAB — CBC WITH DIFFERENTIAL (CANCER CENTER ONLY)
Abs Immature Granulocytes: 0.03 10*3/uL (ref 0.00–0.07)
Basophils Absolute: 0.1 10*3/uL (ref 0.0–0.1)
Basophils Relative: 1 %
Eosinophils Absolute: 0.6 10*3/uL — ABNORMAL HIGH (ref 0.0–0.5)
Eosinophils Relative: 8 %
HCT: 27.7 % — ABNORMAL LOW (ref 36.0–46.0)
Hemoglobin: 9 g/dL — ABNORMAL LOW (ref 12.0–15.0)
Immature Granulocytes: 1 %
Lymphocytes Relative: 19 %
Lymphs Abs: 1.3 10*3/uL (ref 0.7–4.0)
MCH: 28.4 pg (ref 26.0–34.0)
MCHC: 32.5 g/dL (ref 30.0–36.0)
MCV: 87.4 fL (ref 80.0–100.0)
Monocytes Absolute: 0.6 10*3/uL (ref 0.1–1.0)
Monocytes Relative: 10 %
Neutro Abs: 4.1 10*3/uL (ref 1.7–7.7)
Neutrophils Relative %: 61 %
Platelet Count: 306 10*3/uL (ref 150–400)
RBC: 3.17 MIL/uL — ABNORMAL LOW (ref 3.87–5.11)
RDW: 13.8 % (ref 11.5–15.5)
WBC Count: 6.6 10*3/uL (ref 4.0–10.5)
nRBC: 0 % (ref 0.0–0.2)

## 2022-12-07 LAB — CMP (CANCER CENTER ONLY)
ALT: 10 U/L (ref 0–44)
AST: 15 U/L (ref 15–41)
Albumin: 3.9 g/dL (ref 3.5–5.0)
Alkaline Phosphatase: 92 U/L (ref 38–126)
Anion gap: 9 (ref 5–15)
BUN: 46 mg/dL — ABNORMAL HIGH (ref 8–23)
CO2: 24 mmol/L (ref 22–32)
Calcium: 8.6 mg/dL — ABNORMAL LOW (ref 8.9–10.3)
Chloride: 100 mmol/L (ref 98–111)
Creatinine: 2.45 mg/dL — ABNORMAL HIGH (ref 0.44–1.00)
GFR, Estimated: 21 mL/min — ABNORMAL LOW (ref >60)
Glucose, Bld: 121 mg/dL — ABNORMAL HIGH (ref 70–99)
Potassium: 4 mmol/L (ref 3.5–5.1)
Sodium: 133 mmol/L — ABNORMAL LOW (ref 135–145)
Total Bilirubin: 0.3 mg/dL (ref <1.2)
Total Protein: 6 g/dL — ABNORMAL LOW (ref 6.5–8.1)

## 2022-12-07 LAB — RETIC PANEL
Immature Retic Fract: 6.6 % (ref 2.3–15.9)
RBC.: 3.19 MIL/uL — ABNORMAL LOW (ref 3.87–5.11)
Retic Count, Absolute: 28.1 10*3/uL (ref 19.0–186.0)
Retic Ct Pct: 0.9 % (ref 0.4–3.1)
Reticulocyte Hemoglobin: 32.7 pg (ref >27.9)

## 2022-12-07 LAB — VITAMIN B12: Vitamin B-12: 366 pg/mL (ref 180–914)

## 2022-12-07 NOTE — Progress Notes (Signed)
Hematology and Oncology Follow Up Visit  Brittney Pace [id number] November 11, 1955 67 y.o. 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   She reports that she has been doing ok In terms of her blood pressure she thinks that she accidentally was not taking enough of her blood pressure medication.   No fevers, night sweats, unintentional weight loss, bleeding episodes or significant bruising episodes. No chest pains, SOB, no significant peripheral edema. No visual changes. No headaches.   Currently, I would say that her performance status is probably ECOG 2-3.  Wt Readings from Last 3 Encounters:  12/07/22 109 lb 1.3 oz (49.5 kg)  11/30/22 112 lb 1.9 oz (50.9 kg)  10/14/22 103 lb (46.7 kg)    Medications:  Allergies as of 12/07/2022       Reactions   Morphine Anaphylaxis, Shortness Of Breath   Penicillins Hives   Tolerated ANCEF on 04/30/20 Has patient had a PCN reaction causing immediate rash, facial/tongue/throat swelling, SOB or lightheadedness with hypotension: Yes Has patient had a PCN reaction causing severe rash involving mucus membranes or skin necrosis: No Has patient had a PCN reaction that required hospitalization No Has patient had a PCN reaction occurring within the last 10 years: No If all of the above answers are "NO", then may proceed with Cephalosporin use.   Tramadol Anaphylaxis   Acetaminophen Other (See Comments)   Alters insulin pump readings   Amitriptyline Other (See Comments)   Severe headache/ out of body feeling   Codeine Other (See Comments)   Severe headaches/ out of body feeling   Ibuprofen Other (See Comments)   Messes up CGM reading on glucose monitor   Krill Oil Diarrhea, Itching, Nausea And Vomiting   Losartan Cough    Propoxyphene Other (See Comments)   Severe headaches / out of body feeling   Shellfish Allergy Diarrhea, Nausea And Vomiting   Statins Other (See Comments)   Muscle pains Other reaction(s): Other   Sulfamethoxazole Other (See Comments)   Mouth ulcers   Sulfites Itching, Other (See Comments)   Mouth ulcers   Fluconazole    Other reaction(s): Contact Dermatitis (intolerance)   Norvasc [amlodipine Besylate] Swelling        Medication List        Accurate as of December 07, 2022  3:26 PM. If you have any questions, ask your nurse or doctor.          Accu-Chek Guide test strip Generic drug: glucose blood 3 (three) times daily.   Alpha-Lipoic Acid 100 MG Caps Take 1 capsule by mouth daily.   Armour Thyroid 60 MG tablet Generic drug: thyroid Take 1 tablet by mouth daily before breakfast.   aspirin EC 325 MG tablet Take 1 tablet (325 mg total) by mouth daily. What changed: when to take this   carvedilol 12.5 MG tablet Commonly known as: COREG TAKE 1 TABLET BY MOUTH TWICE DAILY WITH A MEAL   cetirizine 10 MG tablet Commonly known as: ZYRTEC Take 10 mg by mouth at bedtime.   Dexcom G7 Sensor Misc Inject 1 each into the skin as directed. Every 10 days change it.   diphenoxylate-atropine 2.5-0.025 MG tablet Commonly known as: LOMOTIL TAKE 1 TABLET BY MOUTH 4 TIMES DAILY AS  NEEDED FOR DIARRHEA OR  LOOSE  STOOLS What changed: See the new instructions.   esomeprazole 20 MG capsule Commonly known as: NEXIUM Take 20 mg by mouth every morning.   Fluocinolone Acetonide 0.01 % Oil Place 2 Doses in ear(s) daily.   fosfomycin 3 g Pack Commonly known as: MONUROL Divide packet into 4 equal doses. Take 1/4th packet by mouth with liquid twice daily for 1 day. You will have 1/2 packet left over that you will not take.   hydrALAZINE 50 MG tablet Commonly known as: APRESOLINE Take 1 tablet (50 mg total) by mouth in the morning and at bedtime.   isosorbide mononitrate 60 MG  24 hr tablet Commonly known as: IMDUR Take 60 mg by mouth daily.   ketoconazole 2 % shampoo Commonly known as: NIZORAL Apply 1 Application topically 2 (two) times a week.   lamoTRIgine 150 MG tablet Commonly known as: LAMICTAL Take 150 mg by mouth at bedtime.   Melatonin Gummies 2.5 MG Chew Chew 2.5 mg by mouth at bedtime as needed (sleep).   mirtazapine 30 MG tablet Commonly known as: REMERON Take 30 mg by mouth at bedtime.   NovoLOG 100 UNIT/ML injection Generic drug: insulin aspart Inject 25 Units into the skin continuous.   Premarin vaginal cream Generic drug: conjugated estrogens Place 1 applicator vaginally daily as needed (irritation).   torsemide 20 MG tablet Commonly known as: [name] Take 2 tablets (40 mg total) by mouth daily.   valACYclovir 500 MG tablet Commonly known as: VALTREX Take 500 mg by mouth daily as needed (Flair up).        Allergies:  Allergies  Allergen Reactions   Morphine Anaphylaxis and Shortness Of Breath   Penicillins Hives    Tolerated ANCEF on 04/30/20 Has patient had a PCN reaction causing immediate rash, facial/tongue/throat swelling, SOB or lightheadedness with hypotension: Yes Has patient had a PCN reaction causing severe rash involving mucus membranes or skin necrosis: No Has patient had a PCN reaction that required hospitalization No Has patient had a PCN reaction occurring within the last 10 years: No If all of the above answers are "NO", then may proceed with Cephalosporin use.   Tramadol Anaphylaxis   Acetaminophen Other (See Comments)    Alters insulin pump readings    Amitriptyline Other (See Comments)    Severe headache/ out of body feeling   Codeine Other (See Comments)    Severe headaches/ out of body feeling    Ibuprofen Other (See Comments)    Messes up CGM reading on glucose monitor     Krill Oil Diarrhea, Itching and Nausea And Vomiting   Losartan Cough   Propoxyphene Other (See Comments)    Severe  headaches / out of body feeling    Shellfish Allergy Diarrhea and Nausea And Vomiting   Statins Other (See Comments)    Muscle pains Other reaction(s): Other   Sulfamethoxazole Other (See Comments)    Mouth ulcers    Sulfites Itching and Other (See Comments)    Mouth ulcers   Fluconazole     Other reaction(s): Contact Dermatitis (intolerance)   Norvasc [Amlodipine Besylate] Swelling    Past Medical History, Surgical history, Social history, and Family History were reviewed and updated.  Review of Systems: Review of Systems  Constitutional:  Negative for chills, fever and malaise/fatigue.  HENT: Negative.    Eyes: Negative.   Respiratory: Negative.    Cardiovascular:  Negative for leg swelling.  Gastrointestinal:  Negative for abdominal pain and  diarrhea.  Genitourinary:  Negative for dysuria, flank pain, frequency, hematuria and urgency.  Musculoskeletal:  Positive for joint pain and myalgias.  Skin:  Negative for rash.  Neurological: Negative.   Endo/Heme/Allergies: Negative.   Psychiatric/Behavioral: Negative.     Wt Readings from Last 3 Encounters:  12/07/22 109 lb 1.3 oz (49.5 kg)  11/30/22 112 lb 1.9 oz (50.9 kg)  10/14/22 103 lb (46.7 kg)   Vitals:   12/07/22 1505  BP: (!) 186/75  Pulse: 64  Resp: 19  Temp: 98.4 F (36.9 C)  SpO2: 100%     Physical Exam Vitals reviewed.  HENT:     Head: Normocephalic and atraumatic.  Eyes:     Pupils: Pupils are equal, round, and reactive to light.  Cardiovascular:     Rate and Rhythm: Normal rate and regular rhythm.     Heart sounds: Normal heart sounds.  Pulmonary:     Effort: Pulmonary effort is normal.     Breath sounds: Normal breath sounds.  Musculoskeletal:     Cervical back: Normal range of motion.  Lymphadenopathy:     Cervical: No cervical adenopathy.  Skin:    General: Skin is warm and dry.     Findings: No erythema or rash.  Neurological:     Mental Status: She is alert and oriented to person,  place, and time.  Psychiatric:        Behavior: Behavior normal.        Thought Content: Thought content normal.        Judgment: Judgment normal.     Lab Results  Component Value Date   WBC 6.6 12/07/2022   HGB 9.0 (L) 12/07/2022   HCT 27.7 (L) 12/07/2022   MCV 87.4 12/07/2022   PLT 306 12/07/2022   Lab Results  Component Value Date   FERRITIN 30 09/15/2022   IRON 66 09/15/2022   TIBC 298 09/15/2022   UIBC 232 09/15/2022   IRONPCTSAT 22 09/15/2022   Lab Results  Component Value Date   RETICCTPCT 0.9 12/07/2022   RBC 3.19 (L) 12/07/2022   RBC 3.17 (L) 12/07/2022   No results found for: "KPAFRELGTCHN", "LAMBDASER", "KAPLAMBRATIO" No results found for: "IGGSERUM", "IGA", "IGMSERUM" No results found for: "TOTALPROTELP", "ALBUMINELP", "A1GS", "A2GS", "BETS", "BETA2SER", "GAMS", "MSPIKE", "SPEI"   Chemistry      Component Value Date/Time   NA 135 11/30/2022 1413   NA 138 07/17/2017 1128   K 4.8 11/30/2022 1413   CL 102 11/30/2022 1413   CO2 25 11/30/2022 1413   BUN 38 (H) 11/30/2022 1413   BUN 23 07/17/2017 1128   CREATININE 2.52 (H) 11/30/2022 1413   CREATININE 2.45 (H) 03/03/2022 1129      Component Value Date/Time   CALCIUM 8.6 (L) 11/30/2022 1413   ALKPHOS 78 11/30/2022 1413   AST 16 11/30/2022 1413   ALT 10 11/30/2022 1413   BILITOT 0.3 11/30/2022 1413     Encounter Diagnoses  Name Primary?   CKD stage 3 due to type 1 diabetes mellitus (HCC) Yes   Anemia due to stage 3a chronic kidney disease (HCC)    Iron deficiency anemia, unspecified iron deficiency anemia type    Nutritional deficiency    Impression and Plan: Ms. Kizewski is a very pleasant 67 yo female with multifactorial anemia. She has CKD, HTN. She will check with her pharmacist to ensure she is taking the correct doses of her medications. She will also schedule an ASAP follow up with her PCP, nephrologist and  cardiologist. Of note she had troubles with iron overload after last iron infusion many  years ago.   Today again her BP is too high. We once again discussed the importance of BP control. She thinks that she is taking half of what she needs to be taking in terms of BP medications. She will confirm this with her pharmacist as well as the prescribing providers. She will check her BP at home. Discussed red flags. She will follow up with providers this week. Goal is BP < 140/80 for ESA. Fortunately her Hgb is up a bit today. Will add iron/B12 and folate levels to labs for next visit to help maximize RBC production as ESA is currently on hold due to uncontrolled    Disposition: No Aranesp today due to BP RTC 1 weeks APP,labs (CBC w/,CMP, retic, B12)  Rushie Chestnut, PA-C 11/13/20243:26 PM

## 2022-12-14 ENCOUNTER — Inpatient Hospital Stay: Payer: Medicare Other

## 2022-12-14 ENCOUNTER — Inpatient Hospital Stay (HOSPITAL_BASED_OUTPATIENT_CLINIC_OR_DEPARTMENT_OTHER): Payer: Medicare Other | Admitting: Medical Oncology

## 2022-12-14 ENCOUNTER — Encounter: Payer: Self-pay | Admitting: Medical Oncology

## 2022-12-14 VITALS — BP 143/59 | HR 66 | Temp 98.2°F | Resp 19 | Ht 59.0 in | Wt 110.0 lb

## 2022-12-14 DIAGNOSIS — N1831 Chronic kidney disease, stage 3a: Secondary | ICD-10-CM

## 2022-12-14 DIAGNOSIS — D509 Iron deficiency anemia, unspecified: Secondary | ICD-10-CM | POA: Diagnosis not present

## 2022-12-14 DIAGNOSIS — E639 Nutritional deficiency, unspecified: Secondary | ICD-10-CM

## 2022-12-14 DIAGNOSIS — N184 Chronic kidney disease, stage 4 (severe): Secondary | ICD-10-CM | POA: Diagnosis not present

## 2022-12-14 DIAGNOSIS — D631 Anemia in chronic kidney disease: Secondary | ICD-10-CM

## 2022-12-14 DIAGNOSIS — N183 Chronic kidney disease, stage 3 unspecified: Secondary | ICD-10-CM

## 2022-12-14 DIAGNOSIS — D638 Anemia in other chronic diseases classified elsewhere: Secondary | ICD-10-CM

## 2022-12-14 LAB — CMP (CANCER CENTER ONLY)
ALT: 11 U/L (ref 0–44)
AST: 18 U/L (ref 15–41)
Albumin: 3.7 g/dL (ref 3.5–5.0)
Alkaline Phosphatase: 85 U/L (ref 38–126)
Anion gap: 9 (ref 5–15)
BUN: 46 mg/dL — ABNORMAL HIGH (ref 8–23)
CO2: 24 mmol/L (ref 22–32)
Calcium: 9 mg/dL (ref 8.9–10.3)
Chloride: 100 mmol/L (ref 98–111)
Creatinine: 2.35 mg/dL — ABNORMAL HIGH (ref 0.44–1.00)
GFR, Estimated: 22 mL/min — ABNORMAL LOW (ref >60)
Glucose, Bld: 146 mg/dL — ABNORMAL HIGH (ref 70–99)
Potassium: 4.6 mmol/L (ref 3.5–5.1)
Sodium: 133 mmol/L — ABNORMAL LOW (ref 135–145)
Total Bilirubin: 0.4 mg/dL (ref <1.2)
Total Protein: 5.8 g/dL — ABNORMAL LOW (ref 6.5–8.1)

## 2022-12-14 LAB — CBC WITH DIFFERENTIAL (CANCER CENTER ONLY)
Abs Immature Granulocytes: 0.01 10*3/uL (ref 0.00–0.07)
Basophils Absolute: 0.1 10*3/uL (ref 0.0–0.1)
Basophils Relative: 1 %
Eosinophils Absolute: 0.5 10*3/uL (ref 0.0–0.5)
Eosinophils Relative: 8 %
HCT: 26.8 % — ABNORMAL LOW (ref 36.0–46.0)
Hemoglobin: 8.8 g/dL — ABNORMAL LOW (ref 12.0–15.0)
Immature Granulocytes: 0 %
Lymphocytes Relative: 21 %
Lymphs Abs: 1.4 10*3/uL (ref 0.7–4.0)
MCH: 28.7 pg (ref 26.0–34.0)
MCHC: 32.8 g/dL (ref 30.0–36.0)
MCV: 87.3 fL (ref 80.0–100.0)
Monocytes Absolute: 0.6 10*3/uL (ref 0.1–1.0)
Monocytes Relative: 10 %
Neutro Abs: 4 10*3/uL (ref 1.7–7.7)
Neutrophils Relative %: 60 %
Platelet Count: 316 10*3/uL (ref 150–400)
RBC: 3.07 MIL/uL — ABNORMAL LOW (ref 3.87–5.11)
RDW: 14.1 % (ref 11.5–15.5)
WBC Count: 6.6 10*3/uL (ref 4.0–10.5)
nRBC: 0 % (ref 0.0–0.2)

## 2022-12-14 LAB — FOLATE: Folate: 10.6 ng/mL (ref >5.9)

## 2022-12-14 LAB — FERRITIN: Ferritin: 124 ng/mL (ref 11–307)

## 2022-12-14 LAB — VITAMIN B12: Vitamin B-12: 354 pg/mL (ref 180–914)

## 2022-12-14 MED ORDER — DARBEPOETIN ALFA 300 MCG/0.6ML IJ SOSY
300.0000 ug | PREFILLED_SYRINGE | Freq: Once | INTRAMUSCULAR | Status: AC
  Administered 2022-12-14: 300 ug via SUBCUTANEOUS
  Filled 2022-12-14: qty 0.6

## 2022-12-14 NOTE — Progress Notes (Signed)
Hematology and Oncology Follow Up Visit  Brittney Pace [id number] 10/29/1955 67 y.o. 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   At her last visit she was in need of her Aranesp however her blood pressure has been too elevated. We had stressed the importance of good BP control to patient many times in the past and I have heavily encouraged her to follow up with her PCP and specialists. We held her Aranesp at her last visit. She was to ensure that she was taking her medications as prescribed and follow up today for reconsideration of Aranesp. We also has her increase her calcium intake due to some hypocalcemia. .   Today she reports that she is feeling ok. Slightly improved from when her BP was higher but still has fatigue. She reports that she was not taking the correct dose of her Isosorbide which was why her BP was running high. She is now taking the correct dose   No fevers, night sweats, unintentional weight loss, bleeding episodes or significant bruising episodes. No chest pains, SOB, no significant peripheral edema. No visual changes. No headaches. 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 She is followed by optometry.   Currently, I would say that her performance status is probably ECOG 2-3.  Wt Readings from Last 3 Encounters:  12/14/22 110 lb (49.9 kg)  12/07/22 109 lb 1.3 oz (49.5 kg)  11/30/22 112 lb 1.9 oz (50.9 kg)    Medications:  Allergies as of 12/14/2022       Reactions   Morphine Anaphylaxis, Shortness Of Breath   Penicillins Hives   Tolerated ANCEF on 04/30/20 Has patient had a PCN reaction causing immediate rash, facial/tongue/throat swelling, SOB or lightheadedness with hypotension: Yes Has patient had a PCN reaction causing  severe rash involving mucus membranes or skin necrosis: No Has patient had a PCN reaction that required hospitalization No Has patient had a PCN reaction occurring within the last 10 years: No If all of the above answers are "NO", then may proceed with Cephalosporin use.   Tramadol Anaphylaxis   Acetaminophen Other (See Comments)   Alters insulin pump readings   Amitriptyline Other (See Comments)   Severe headache/ out of body feeling   Codeine Other (See Comments)   Severe headaches/ out of body feeling   Ibuprofen Other (See Comments)   Messes up CGM reading on glucose monitor   Krill Oil Diarrhea, Itching, Nausea And Vomiting   Losartan Cough   Propoxyphene Other (See Comments)   Severe headaches / out of body feeling   Shellfish Allergy Diarrhea, Nausea And Vomiting   Statins Other (See Comments)   Muscle pains Other reaction(s): Other   Sulfamethoxazole Other (See Comments)   Mouth ulcers   Sulfites Itching, Other (See Comments)   Mouth ulcers   Fluconazole    Other reaction(s): Contact Dermatitis (intolerance)   Norvasc [amlodipine Besylate] Swelling        Medication List        Accurate as of December 14, 2022  2:57 PM. If you have any questions, ask your nurse or doctor.          Accu-Chek Guide test strip Generic drug: glucose blood 3 (three) times daily.   Alpha-Lipoic Acid  100 MG Caps Take 1 capsule by mouth daily.   Armour Thyroid 60 MG tablet Generic drug: thyroid Take 1 tablet by mouth daily before breakfast.   aspirin EC 325 MG tablet Take 1 tablet (325 mg total) by mouth daily. What changed: when to take this   carvedilol 12.5 MG tablet Commonly known as: COREG TAKE 1 TABLET BY MOUTH TWICE DAILY WITH A MEAL   cetirizine 10 MG tablet Commonly known as: ZYRTEC Take 10 mg by mouth at bedtime.   Dexcom G7 Sensor Misc Inject 1 each into the skin as directed. Every 10 days change it.   diphenoxylate-atropine 2.5-0.025 MG tablet Commonly  known as: LOMOTIL TAKE 1 TABLET BY MOUTH 4 TIMES DAILY AS NEEDED FOR DIARRHEA OR  LOOSE  STOOLS What changed: See the new instructions.   esomeprazole 20 MG capsule Commonly known as: NEXIUM Take 20 mg by mouth every morning.   Fluocinolone Acetonide 0.01 % Oil Place 2 Doses in ear(s) daily.   fosfomycin 3 g Pack Commonly known as: MONUROL Divide packet into 4 equal doses. Take 1/4th packet by mouth with liquid twice daily for 1 day. You will have 1/2 packet left over that you will not take.   hydrALAZINE 50 MG tablet Commonly known as: APRESOLINE Take 1 tablet (50 mg total) by mouth in the morning and at bedtime.   isosorbide mononitrate 60 MG 24 hr tablet Commonly known as: IMDUR Take 60 mg by mouth daily.   ketoconazole 2 % shampoo Commonly known as: NIZORAL Apply 1 Application topically 2 (two) times a week.   lamoTRIgine 150 MG tablet Commonly known as: LAMICTAL Take 150 mg by mouth at bedtime.   Melatonin Gummies 2.5 MG Chew Chew 2.5 mg by mouth at bedtime as needed (sleep).   mirtazapine 30 MG tablet Commonly known as: REMERON Take 30 mg by mouth at bedtime.   NovoLOG 100 UNIT/ML injection Generic drug: insulin aspart Inject 25 Units into the skin continuous.   Premarin vaginal cream Generic drug: conjugated estrogens Place 1 applicator vaginally daily as needed (irritation).   torsemide 20 MG tablet Commonly known as: [name] Take 2 tablets (40 mg total) by mouth daily.   valACYclovir 500 MG tablet Commonly known as: VALTREX Take 500 mg by mouth daily as needed (Flair up).        Allergies:  Allergies  Allergen Reactions   Morphine Anaphylaxis and Shortness Of Breath   Penicillins Hives    Tolerated ANCEF on 04/30/20 Has patient had a PCN reaction causing immediate rash, facial/tongue/throat swelling, SOB or lightheadedness with hypotension: Yes Has patient had a PCN reaction causing severe rash involving mucus membranes or skin necrosis: No Has  patient had a PCN reaction that required hospitalization No Has patient had a PCN reaction occurring within the last 10 years: No If all of the above answers are "NO", then may proceed with Cephalosporin use.   Tramadol Anaphylaxis   Acetaminophen Other (See Comments)    Alters insulin pump readings    Amitriptyline Other (See Comments)    Severe headache/ out of body feeling   Codeine Other (See Comments)    Severe headaches/ out of body feeling    Ibuprofen Other (See Comments)    Messes up CGM reading on glucose monitor     Krill Oil Diarrhea, Itching and Nausea And Vomiting   Losartan Cough   Propoxyphene Other (See Comments)    Severe headaches / out of body feeling    Shellfish Allergy  Diarrhea and Nausea And Vomiting   Statins Other (See Comments)    Muscle pains Other reaction(s): Other   Sulfamethoxazole Other (See Comments)    Mouth ulcers    Sulfites Itching and Other (See Comments)    Mouth ulcers   Fluconazole     Other reaction(s): Contact Dermatitis (intolerance)   Norvasc [Amlodipine Besylate] Swelling    Past Medical History, Surgical history, Social history, and Family History were reviewed and updated.  Review of Systems: Review of Systems  Constitutional:  Negative for chills, fever and malaise/fatigue.  HENT: Negative.    Eyes: Negative.   Respiratory: Negative.    Cardiovascular:  Negative for leg swelling.  Gastrointestinal:  Negative for abdominal pain and diarrhea.  Genitourinary:  Negative for dysuria, flank pain, frequency, hematuria and urgency.  Musculoskeletal:  Positive for joint pain and myalgias.  Skin:  Negative for rash.  Neurological: Negative.   Endo/Heme/Allergies: Negative.   Psychiatric/Behavioral: Negative.     Wt Readings from Last 3 Encounters:  12/14/22 110 lb (49.9 kg)  12/07/22 109 lb 1.3 oz (49.5 kg)  11/30/22 112 lb 1.9 oz (50.9 kg)   Vitals:   12/14/22 1431  BP: (!) 143/59  Pulse: 66  Resp: 19  Temp: 98.2  F (36.8 C)  SpO2: 100%     Physical Exam Vitals reviewed.  HENT:     Head: Normocephalic and atraumatic.  Eyes:     Pupils: Pupils are equal, round, and reactive to light.  Cardiovascular:     Rate and Rhythm: Normal rate and regular rhythm.     Heart sounds: Normal heart sounds.  Pulmonary:     Effort: Pulmonary effort is normal.     Breath sounds: Normal breath sounds.  Musculoskeletal:     Cervical back: Normal range of motion.  Lymphadenopathy:     Cervical: No cervical adenopathy.  Skin:    General: Skin is warm and dry.     Findings: No erythema or rash.  Neurological:     Mental Status: She is alert and oriented to person, place, and time.  Psychiatric:        Behavior: Behavior normal.        Thought Content: Thought content normal.        Judgment: Judgment normal.     Lab Results  Component Value Date   WBC 6.6 12/14/2022   HGB 8.8 (L) 12/14/2022   HCT 26.8 (L) 12/14/2022   MCV 87.3 12/14/2022   PLT 316 12/14/2022   Lab Results  Component Value Date   FERRITIN 30 09/15/2022   IRON 66 09/15/2022   TIBC 298 09/15/2022   UIBC 232 09/15/2022   IRONPCTSAT 22 09/15/2022   Lab Results  Component Value Date   RETICCTPCT 0.9 12/07/2022   RBC 3.07 (L) 12/14/2022   No results found for: "KPAFRELGTCHN", "LAMBDASER", "KAPLAMBRATIO" No results found for: "IGGSERUM", "IGA", "IGMSERUM" No results found for: "TOTALPROTELP", "ALBUMINELP", "A1GS", "A2GS", "BETS", "BETA2SER", "GAMS", "MSPIKE", "SPEI"   Chemistry      Component Value Date/Time   NA 133 (L) 12/14/2022 1400   NA 138 07/17/2017 1128   K 4.6 12/14/2022 1400   CL 100 12/14/2022 1400   CO2 24 12/14/2022 1400   BUN 46 (H) 12/14/2022 1400   BUN 23 07/17/2017 1128   CREATININE 2.35 (H) 12/14/2022 1400   CREATININE 2.45 (H) 03/03/2022 1129      Component Value Date/Time   CALCIUM 9.0 12/14/2022 1400   ALKPHOS 85 12/14/2022 1400  AST 18 12/14/2022 1400   ALT 11 12/14/2022 1400   BILITOT 0.4  12/14/2022 1400     Encounter Diagnoses  Name Primary?   Anemia due to stage 3a chronic kidney disease (HCC) Yes   Iron deficiency anemia, unspecified iron deficiency anemia type    Nutritional deficiency     Impression and Plan: Ms. Mork is a very pleasant 67 yo female with multifactorial anemia. She has CKD, HTN. She will check with her pharmacist to ensure she is taking the correct doses of her medications. She will also schedule an ASAP follow up with her PCP, nephrologist and cardiologist. Of note she had troubles with iron overload after last iron infusion many years ago.   Aranesp today- again this helps keep her from being transfusion dependant and helps with her chronic fatigue; greatly improving her quality of life. Risks/benefits have been discussed and she wishes to continue with Aranesp at this time despite the risks for clotting events. Very happy to see her much improved blood pressure. She will continue working with her team regarding this.   Disposition: Aranesp today RTC 4 weeks APP, labs (CBC w/,CMP, retic)  Rushie Chestnut, PA-C 11/20/20242:57 PM

## 2022-12-14 NOTE — Patient Instructions (Signed)

## 2022-12-15 ENCOUNTER — Encounter: Payer: Self-pay | Admitting: Hematology & Oncology

## 2022-12-15 LAB — IRON AND IRON BINDING CAPACITY (CC-WL,HP ONLY)
Iron: 107 ug/dL (ref 28–170)
Saturation Ratios: 39 % — ABNORMAL HIGH (ref 10.4–31.8)
TIBC: 277 ug/dL (ref 250–450)
UIBC: 170 ug/dL (ref 148–442)

## 2023-01-10 ENCOUNTER — Other Ambulatory Visit: Payer: Self-pay | Admitting: *Deleted

## 2023-01-10 DIAGNOSIS — D509 Iron deficiency anemia, unspecified: Secondary | ICD-10-CM

## 2023-01-10 DIAGNOSIS — D638 Anemia in other chronic diseases classified elsewhere: Secondary | ICD-10-CM

## 2023-01-10 DIAGNOSIS — D631 Anemia in chronic kidney disease: Secondary | ICD-10-CM

## 2023-01-11 ENCOUNTER — Encounter: Payer: Self-pay | Admitting: Medical Oncology

## 2023-01-11 ENCOUNTER — Inpatient Hospital Stay (HOSPITAL_BASED_OUTPATIENT_CLINIC_OR_DEPARTMENT_OTHER): Payer: Medicare Other | Admitting: Medical Oncology

## 2023-01-11 ENCOUNTER — Inpatient Hospital Stay: Payer: Medicare Other | Attending: Hematology & Oncology

## 2023-01-11 ENCOUNTER — Inpatient Hospital Stay: Payer: Medicare Other

## 2023-01-11 VITALS — BP 143/53 | HR 59 | Temp 98.2°F | Resp 18 | Ht 59.0 in | Wt 114.0 lb

## 2023-01-11 DIAGNOSIS — D638 Anemia in other chronic diseases classified elsewhere: Secondary | ICD-10-CM

## 2023-01-11 DIAGNOSIS — N183 Chronic kidney disease, stage 3 unspecified: Secondary | ICD-10-CM

## 2023-01-11 DIAGNOSIS — I129 Hypertensive chronic kidney disease with stage 1 through stage 4 chronic kidney disease, or unspecified chronic kidney disease: Secondary | ICD-10-CM | POA: Insufficient documentation

## 2023-01-11 DIAGNOSIS — E611 Iron deficiency: Secondary | ICD-10-CM | POA: Insufficient documentation

## 2023-01-11 DIAGNOSIS — D631 Anemia in chronic kidney disease: Secondary | ICD-10-CM | POA: Diagnosis present

## 2023-01-11 DIAGNOSIS — N1831 Chronic kidney disease, stage 3a: Secondary | ICD-10-CM

## 2023-01-11 DIAGNOSIS — D509 Iron deficiency anemia, unspecified: Secondary | ICD-10-CM

## 2023-01-11 DIAGNOSIS — Z79899 Other long term (current) drug therapy: Secondary | ICD-10-CM | POA: Insufficient documentation

## 2023-01-11 DIAGNOSIS — E1022 Type 1 diabetes mellitus with diabetic chronic kidney disease: Secondary | ICD-10-CM | POA: Insufficient documentation

## 2023-01-11 LAB — CMP (CANCER CENTER ONLY)
ALT: 12 U/L (ref 0–44)
AST: 17 U/L (ref 15–41)
Albumin: 3.6 g/dL (ref 3.5–5.0)
Alkaline Phosphatase: 80 U/L (ref 38–126)
Anion gap: 7 (ref 5–15)
BUN: 38 mg/dL — ABNORMAL HIGH (ref 8–23)
CO2: 28 mmol/L (ref 22–32)
Calcium: 8.6 mg/dL — ABNORMAL LOW (ref 8.9–10.3)
Chloride: 92 mmol/L — ABNORMAL LOW (ref 98–111)
Creatinine: 2.63 mg/dL — ABNORMAL HIGH (ref 0.44–1.00)
GFR, Estimated: 19 mL/min — ABNORMAL LOW (ref >60)
Glucose, Bld: 163 mg/dL — ABNORMAL HIGH (ref 70–99)
Potassium: 5.2 mmol/L — ABNORMAL HIGH (ref 3.5–5.1)
Sodium: 127 mmol/L — ABNORMAL LOW (ref 135–145)
Total Bilirubin: 0.5 mg/dL (ref <1.2)
Total Protein: 5.5 g/dL — ABNORMAL LOW (ref 6.5–8.1)

## 2023-01-11 LAB — CBC WITH DIFFERENTIAL (CANCER CENTER ONLY)
Abs Immature Granulocytes: 0.02 10*3/uL (ref 0.00–0.07)
Basophils Absolute: 0.1 10*3/uL (ref 0.0–0.1)
Basophils Relative: 1 %
Eosinophils Absolute: 0.3 10*3/uL (ref 0.0–0.5)
Eosinophils Relative: 5 %
HCT: 29 % — ABNORMAL LOW (ref 36.0–46.0)
Hemoglobin: 9.6 g/dL — ABNORMAL LOW (ref 12.0–15.0)
Immature Granulocytes: 0 %
Lymphocytes Relative: 17 %
Lymphs Abs: 1 10*3/uL (ref 0.7–4.0)
MCH: 30.8 pg (ref 26.0–34.0)
MCHC: 33.1 g/dL (ref 30.0–36.0)
MCV: 92.9 fL (ref 80.0–100.0)
Monocytes Absolute: 0.6 10*3/uL (ref 0.1–1.0)
Monocytes Relative: 10 %
Neutro Abs: 3.9 10*3/uL (ref 1.7–7.7)
Neutrophils Relative %: 67 %
Platelet Count: 338 10*3/uL (ref 150–400)
RBC: 3.12 MIL/uL — ABNORMAL LOW (ref 3.87–5.11)
RDW: 13.9 % (ref 11.5–15.5)
WBC Count: 5.9 10*3/uL (ref 4.0–10.5)
nRBC: 0 % (ref 0.0–0.2)

## 2023-01-11 MED ORDER — DARBEPOETIN ALFA 300 MCG/0.6ML IJ SOSY
300.0000 ug | PREFILLED_SYRINGE | Freq: Once | INTRAMUSCULAR | Status: AC
  Administered 2023-01-11: 300 ug via SUBCUTANEOUS
  Filled 2023-01-11: qty 0.6

## 2023-01-11 NOTE — Progress Notes (Signed)
Hematology and Oncology Follow Up Visit  Brittney Pace [id number] [birth year] 67 y.o. 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   She is here for consideration of Aranesp for her CKD associated anemia. Unfortunately she has not been able to get this injection many times over the past few months due to elevated BP. She has recently seen her PCP and cardiologist who have helped with medication adjustments. Luckily today her BP is much better.   Fatigue is worse. Also having some urinary retention in the terms of not passing much urine. No dysuria. No hematuria. No fevers. She has reached out to her nephrologist who has increased her diuretic. They see her on the 27th of this month for follow up.   No fevers, night sweats, unintentional weight loss, bleeding episodes or significant bruising episodes. No chest pains, no significant peripheral edema. No visual changes. No headaches.Is having some SOB which is new. Nephrology heard some crackles in her lungs at her last visit last week. 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 She is followed by optometry.   Currently, I would say that her performance status is probably ECOG 2-3.  Wt Readings from Last 3 Encounters:  01/11/23 114 lb (51.7 kg)  12/14/22 110 lb (49.9 kg)  12/07/22 109 lb 1.3 oz (49.5 kg)    Medications:  Allergies as of 01/11/2023       Reactions   Morphine Anaphylaxis, Shortness Of Breath   Penicillins Hives   Tolerated ANCEF on 04/30/20 Has patient had a PCN reaction causing immediate rash, facial/tongue/throat swelling, SOB or lightheadedness with hypotension: Yes Has patient had a PCN reaction causing severe rash involving mucus membranes or skin necrosis: No Has patient had a PCN  reaction that required hospitalization No Has patient had a PCN reaction occurring within the last 10 years: No If all of the above answers are "NO", then may proceed with Cephalosporin use.   Tramadol Anaphylaxis   Acetaminophen Other (See Comments)   Alters insulin pump readings   Amitriptyline Other (See Comments)   Severe headache/ out of body feeling   Codeine Other (See Comments)   Severe headaches/ out of body feeling   Ibuprofen Other (See Comments)   Messes up CGM reading on glucose monitor   Krill Oil Diarrhea, Itching, Nausea And Vomiting   Losartan Cough   Propoxyphene Other (See Comments)   Severe headaches / out of body feeling   Shellfish Allergy Diarrhea, Nausea And Vomiting   Statins Other (See Comments)   Muscle pains Other reaction(s): Other   Sulfamethoxazole Other (See Comments)   Mouth ulcers   Sulfites Itching, Other (See Comments)   Mouth ulcers   Fluconazole    Other reaction(s): Contact Dermatitis (intolerance)   Norvasc [amlodipine Besylate] Swelling        Medication List        Accurate as of January 11, 2023  3:14 PM. If you have any questions, ask your nurse or doctor.          Accu-Chek Guide test strip Generic drug: glucose blood 3 (three) times daily.   Alpha-Lipoic Acid 100 MG Caps Take 1 capsule by mouth daily.   Armour Thyroid 60 MG tablet Generic drug: thyroid Take 1 tablet  by mouth daily before breakfast.   aspirin EC 325 MG tablet Take 1 tablet (325 mg total) by mouth daily. What changed: when to take this   carvedilol 12.5 MG tablet Commonly known as: COREG TAKE 1 TABLET BY MOUTH TWICE DAILY WITH A MEAL   cetirizine 10 MG tablet Commonly known as: ZYRTEC Take 10 mg by mouth at bedtime.   Dexcom G7 Sensor Misc Inject 1 each into the skin as directed. Every 10 days change it.   diphenoxylate-atropine 2.5-0.025 MG tablet Commonly known as: LOMOTIL TAKE 1 TABLET BY MOUTH 4 TIMES DAILY AS NEEDED FOR DIARRHEA OR   LOOSE  STOOLS What changed: See the new instructions.   esomeprazole 20 MG capsule Commonly known as: NEXIUM Take 20 mg by mouth every morning.   Fluocinolone Acetonide 0.01 % Oil Place 2 Doses in ear(s) daily.   fosfomycin 3 g Pack Commonly known as: MONUROL Divide packet into 4 equal doses. Take 1/4th packet by mouth with liquid twice daily for 1 day. You will have 1/2 packet left over that you will not take.   hydrALAZINE 50 MG tablet Commonly known as: APRESOLINE Take 1 tablet (50 mg total) by mouth in the morning and at bedtime.   hydrALAZINE 25 MG tablet Commonly known as: APRESOLINE Take 25 mg by mouth 3 (three) times daily.   isosorbide mononitrate 60 MG 24 hr tablet Commonly known as: IMDUR Take 60 mg by mouth daily.   ketoconazole 2 % shampoo Commonly known as: NIZORAL Apply 1 Application topically 2 (two) times a week.   lamoTRIgine 150 MG tablet Commonly known as: LAMICTAL Take 150 mg by mouth at bedtime.   Melatonin Gummies 2.5 MG Chew Chew 2.5 mg by mouth at bedtime as needed (sleep).   mirtazapine 30 MG tablet Commonly known as: REMERON Take 30 mg by mouth at bedtime.   nitroGLYCERIN 0.4 MG/SPRAY spray Commonly known as: NITROLINGUAL Place 1 spray under the tongue every 5 (five) minutes x 3 doses as needed.   NovoLOG 100 UNIT/ML injection Generic drug: insulin aspart Inject 25 Units into the skin continuous.   Premarin vaginal cream Generic drug: conjugated estrogens Place 1 applicator vaginally daily as needed (irritation).   torsemide 20 MG tablet Commonly known as: [name] Take 2 tablets (40 mg total) by mouth daily.   valACYclovir 500 MG tablet Commonly known as: VALTREX Take 500 mg by mouth daily as needed (Flair up).        Allergies:  Allergies  Allergen Reactions   Morphine Anaphylaxis and Shortness Of Breath   Penicillins Hives    Tolerated ANCEF on 04/30/20 Has patient had a PCN reaction causing immediate rash,  facial/tongue/throat swelling, SOB or lightheadedness with hypotension: Yes Has patient had a PCN reaction causing severe rash involving mucus membranes or skin necrosis: No Has patient had a PCN reaction that required hospitalization No Has patient had a PCN reaction occurring within the last 10 years: No If all of the above answers are "NO", then may proceed with Cephalosporin use.   Tramadol Anaphylaxis   Acetaminophen Other (See Comments)    Alters insulin pump readings    Amitriptyline Other (See Comments)    Severe headache/ out of body feeling   Codeine Other (See Comments)    Severe headaches/ out of body feeling    Ibuprofen Other (See Comments)    Messes up CGM reading on glucose monitor     Krill Oil Diarrhea, Itching and Nausea And Vomiting   Losartan  Cough   Propoxyphene Other (See Comments)    Severe headaches / out of body feeling    Shellfish Allergy Diarrhea and Nausea And Vomiting   Statins Other (See Comments)    Muscle pains Other reaction(s): Other   Sulfamethoxazole Other (See Comments)    Mouth ulcers    Sulfites Itching and Other (See Comments)    Mouth ulcers   Fluconazole     Other reaction(s): Contact Dermatitis (intolerance)   Norvasc [Amlodipine Besylate] Swelling    Past Medical History, Surgical history, Social history, and Family History were reviewed and updated.  Review of Systems: Review of Systems  Constitutional:  Positive for malaise/fatigue. Negative for chills and fever.  HENT: Negative.    Eyes: Negative.   Respiratory:  Positive for shortness of breath (mild).   Cardiovascular:  Negative for leg swelling.  Gastrointestinal:  Negative for abdominal pain and diarrhea.  Genitourinary:  Negative for dysuria, flank pain, frequency, hematuria and urgency.  Musculoskeletal:  Positive for joint pain and myalgias.  Skin:  Negative for rash.  Neurological: Negative.   Endo/Heme/Allergies: Negative.   Psychiatric/Behavioral: Negative.      Wt Readings from Last 3 Encounters:  01/11/23 114 lb (51.7 kg)  12/14/22 110 lb (49.9 kg)  12/07/22 109 lb 1.3 oz (49.5 kg)   Vitals:   01/11/23 1406  BP: (!) 143/53  Pulse: (!) 59  Resp: 18  Temp: 98.2 F (36.8 C)  SpO2: 100%    Physical Exam Vitals reviewed.  Constitutional:      Comments: Gentle use of cane for ambulation   HENT:     Head: Normocephalic and atraumatic.  Eyes:     Pupils: Pupils are equal, round, and reactive to light.  Cardiovascular:     Rate and Rhythm: Normal rate and regular rhythm.     Heart sounds: Normal heart sounds.  Pulmonary:     Effort: Pulmonary effort is normal.     Breath sounds: Normal breath sounds.  Musculoskeletal:     Cervical back: Normal range of motion.     Right lower leg: No edema.     Left lower leg: No edema.  Lymphadenopathy:     Cervical: No cervical adenopathy.  Skin:    General: Skin is warm and dry.     Findings: No erythema or rash.  Neurological:     Mental Status: She is alert and oriented to person, place, and time.  Psychiatric:        Behavior: Behavior normal.        Thought Content: Thought content normal.        Judgment: Judgment normal.     Lab Results  Component Value Date   WBC 5.9 01/11/2023   HGB 9.6 (L) 01/11/2023   HCT 29.0 (L) 01/11/2023   MCV 92.9 01/11/2023   PLT 338 01/11/2023   Lab Results  Component Value Date   FERRITIN 124 12/14/2022   IRON 107 12/14/2022   TIBC 277 12/14/2022   UIBC 170 12/14/2022   IRONPCTSAT 39 (H) 12/14/2022   Lab Results  Component Value Date   RETICCTPCT 0.9 12/07/2022   RBC 3.12 (L) 01/11/2023   No results found for: "KPAFRELGTCHN", "LAMBDASER", "KAPLAMBRATIO" No results found for: "IGGSERUM", "IGA", "IGMSERUM" No results found for: "TOTALPROTELP", "ALBUMINELP", "A1GS", "A2GS", "BETS", "BETA2SER", "GAMS", "MSPIKE", "SPEI"   Chemistry      Component Value Date/Time   NA 127 (L) 01/11/2023 1351   NA 138 07/17/2017 1128   K 5.2 (H)  01/11/2023 1351   CL 92 (L) 01/11/2023 1351   CO2 28 01/11/2023 1351   BUN 38 (H) 01/11/2023 1351   BUN 23 07/17/2017 1128   CREATININE 2.63 (H) 01/11/2023 1351   CREATININE 2.45 (H) 03/03/2022 1129      Component Value Date/Time   CALCIUM 8.6 (L) 01/11/2023 1351   ALKPHOS 80 01/11/2023 1351   AST 17 01/11/2023 1351   ALT 12 01/11/2023 1351   BILITOT 0.5 01/11/2023 1351     Encounter Diagnoses  Name Primary?   Anemia due to stage 3a chronic kidney disease (HCC) Yes   Iron deficiency anemia, unspecified iron deficiency anemia type    Erythropoietin deficiency anemia     Impression and Plan: Ms. Cumbo is a very pleasant 67 yo female with multifactorial anemia. She has CKD, HTN, DM1.   I am very happy to see that her BP is under better control. I am concerned about her additional symptoms though and have encouraged her to follow up with her nephrologist, cardiologist and PCP.   Disposition: Aranesp today RTC 4 weeks MD, labs (CBC w/,CMP, retic)  Rushie Chestnut, PA-C 12/18/20243:14 PM

## 2023-01-18 IMAGING — CT CT CHEST-ABD-PELV W/ CM
2 of 5 series · 13 of 36 positions shown, 15 images · IV contrast (Omnipaque)
Comparison: 06/19/2020 chest radiograph. Abdominopelvic CT
05/23/2019. Abdominal MRI 07/01/2019.

CLINICAL DATA: Enlarged pelvic/inguinal lymph nodes. Question
lymphoma. Diabetic.

EXAM:
CT CHEST, ABDOMEN, AND PELVIS WITH CONTRAST
TECHNIQUE: Multidetector CT imaging of the chest, abdomen and pelvis was
performed following the standard protocol during bolus
administration of intravenous contrast.
CONTRAST:  75mL OMNIPAQUE IOHEXOL 300 MG/ML  SOLN

[Series 2: cap with 2 · axial · 0.70mm/px · z∈[+660,+1125]mm · 10 of 115 slices shown, 12 images]
[im 11/115  mediastinal]
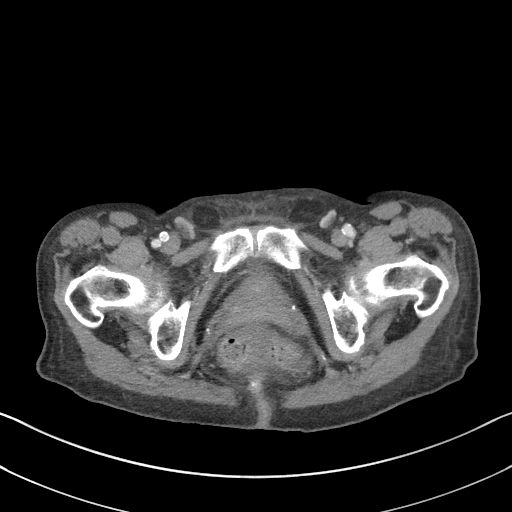
[im 11/115  bone]
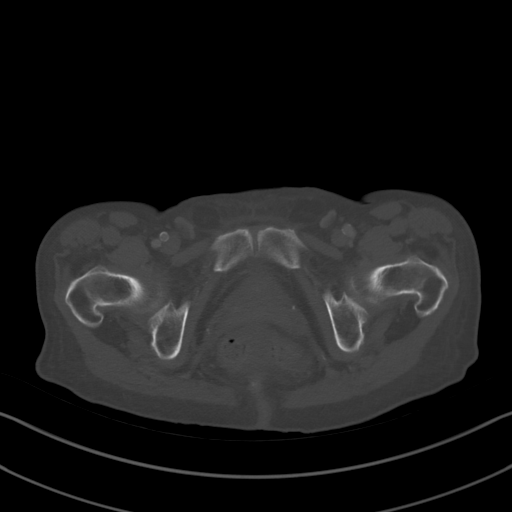
[im 21/115  mediastinal]
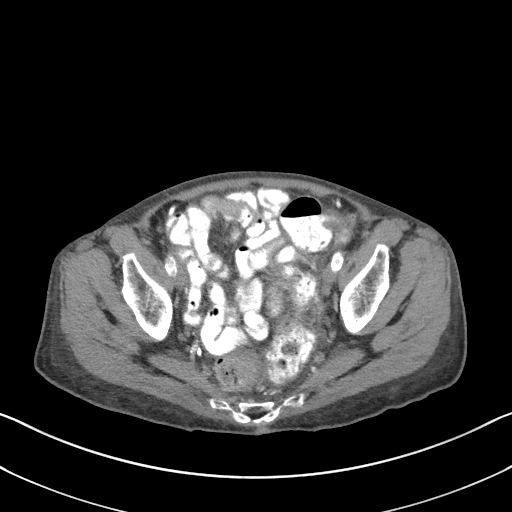
[im 32/115  mediastinal]
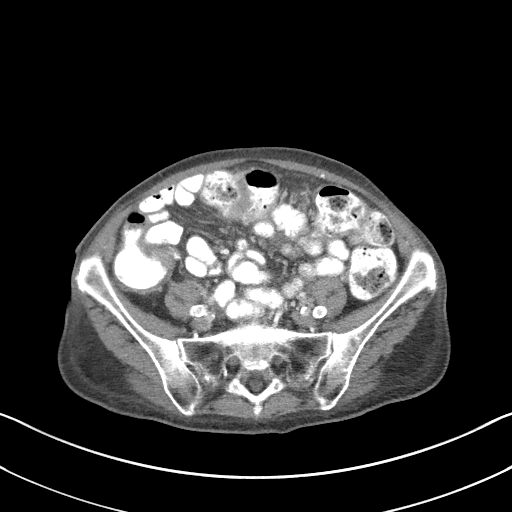
[im 42/115  mediastinal]
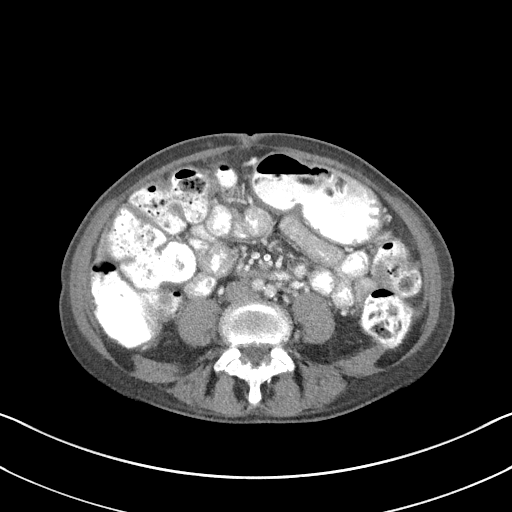
[im 52/115  mediastinal]
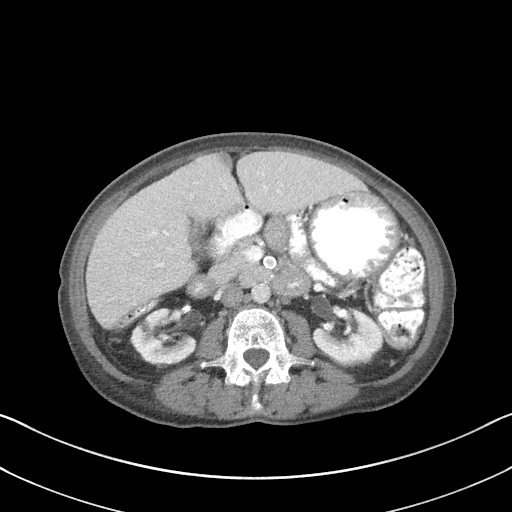
[im 63/115  mediastinal]
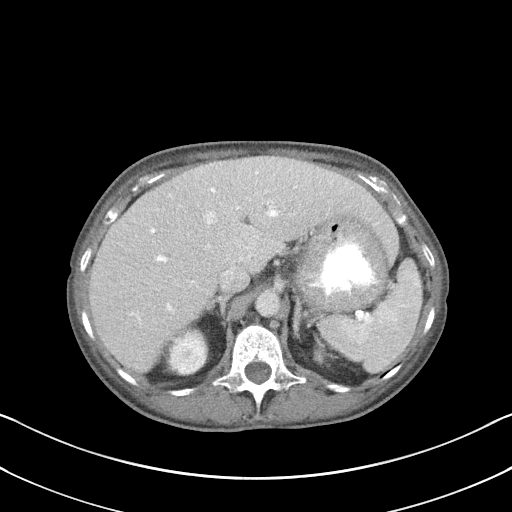
[im 73/115  mediastinal]
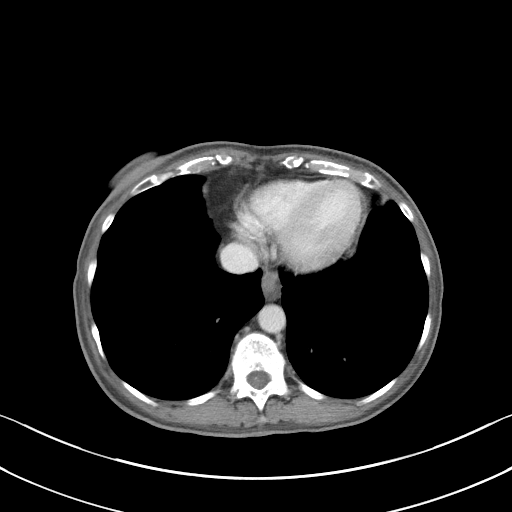
[im 83/115  mediastinal]
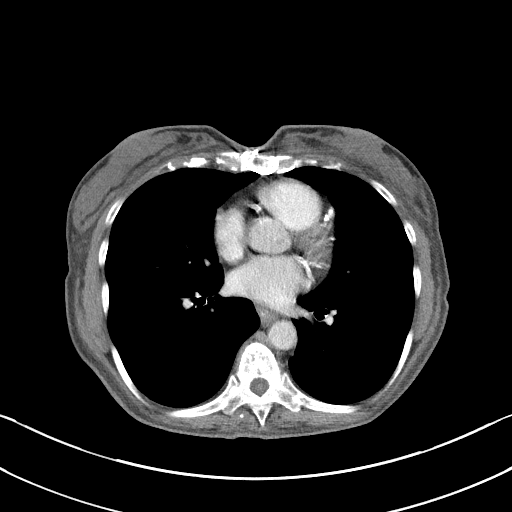
[im 94/115  mediastinal]
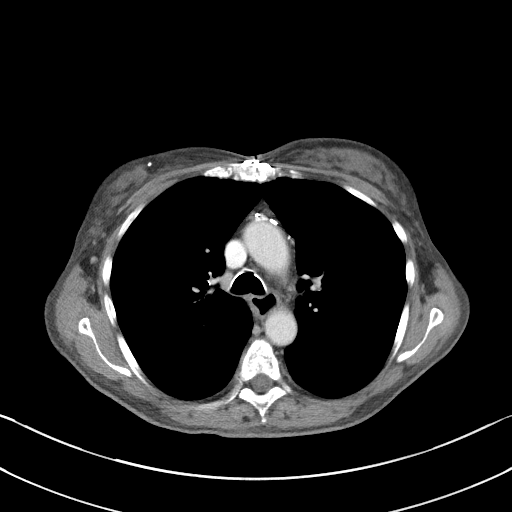
[im 94/115  bone]
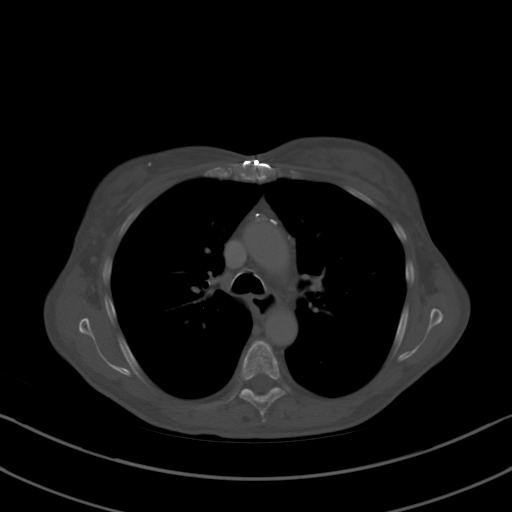
[im 104/115  mediastinal]
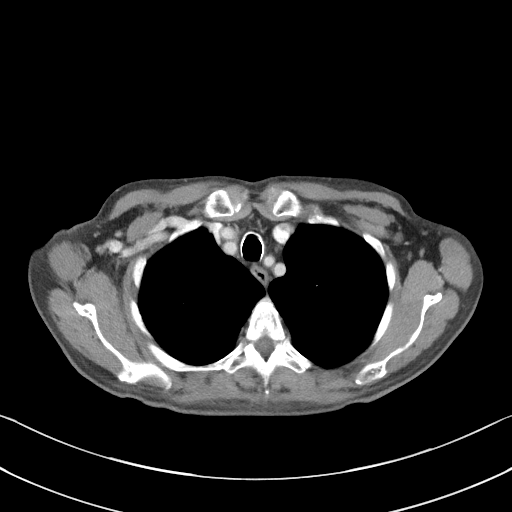

[Series 5: coronals · coronal · 0.75mm/px · 3 of 113 slices shown]
[im 23/113  mediastinal]
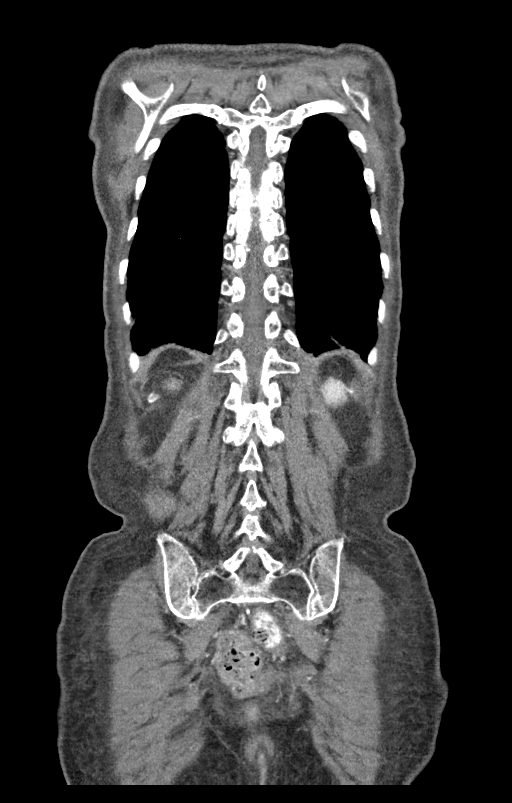
[im 45/113  mediastinal]
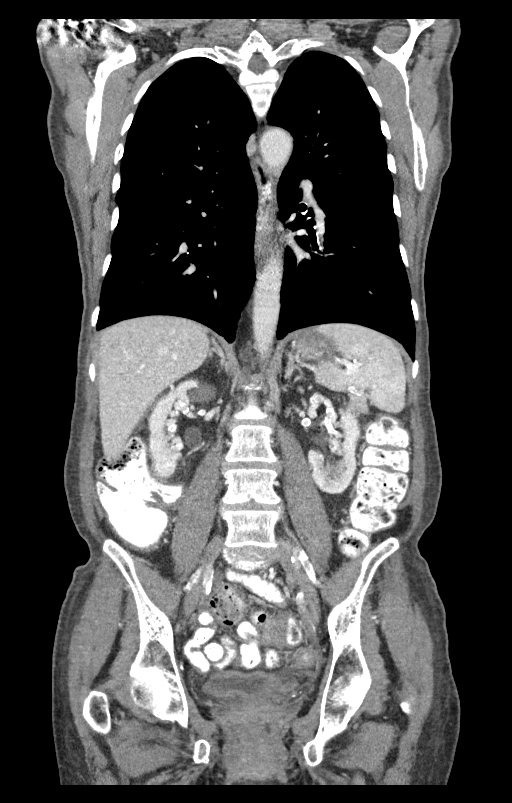
[im 68/113  mediastinal]
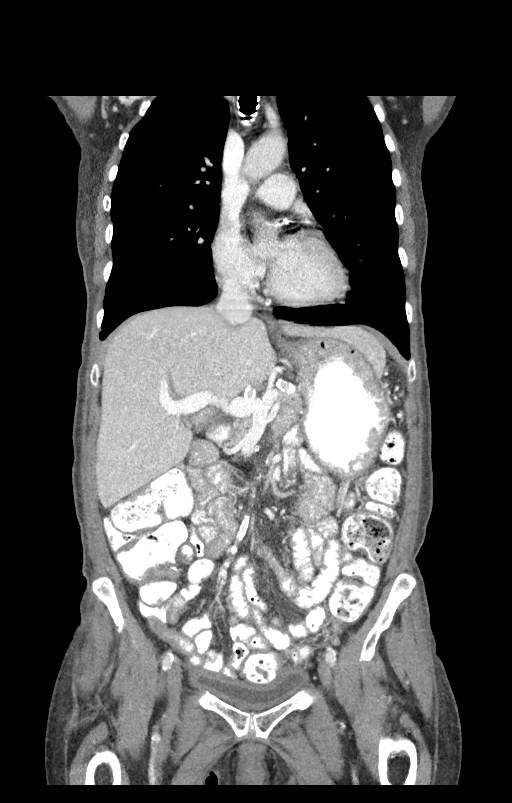

[13 of 36 positions shown; findings below may reference images not displayed]

FINDINGS: CT CHEST FINDINGS

Cardiovascular: Aortic atherosclerosis. Normal heart size, without
pericardial effusion. Median sternotomy for CABG. No central
pulmonary embolism, on this non-dedicated study.

Mediastinum/Nodes: No supraclavicular adenopathy. No axillary
adenopathy. No mediastinal or hilar adenopathy.fluid level in the
esophagus on [DATE].

Lungs/Pleura: No pleural fluid. Right upper lobe ground-glass nodule
of 7 mm on 48/4.

Bibasilar scarring.

4 mm right lower lobe ground-glass nodule on 107/4.

Mild centrilobular emphysema.

4 mm ground-glass nodule in the left upper lobe on 34/4.

Musculoskeletal: No acute osseous abnormality. Right proximal
humerus fixation. Prior median sternotomy.

CT ABDOMEN PELVIS FINDINGS

Hepatobiliary: Too small to characterize segment 2 lesion. Focal
steatosis adjacent the falciform ligament. Normal gallbladder,
without biliary ductal dilatation.

Pancreas: Normal, without mass or ductal dilatation.

Spleen: Normal in size, without focal abnormality.

Adrenals/Urinary Tract: Normal adrenal glands. Mild renal cortical
thinning bilaterally. Bilateral renal lesions, including the complex
upper pole right renal lesion have been evaluated on prior MRI. This
lesion measures on the order of 1.7 cm on 57/2 versusr similar on
the prior CT (when remeasured). No hydronephrosis. Extensive renal
vascular calcifications bilaterally. Mild bladder wall thickening.

Stomach/Bowel: Normal stomach, without wall thickening. Normal colon
and terminal ileum. Normal small bowel.

Vascular/Lymphatic: Aortic atherosclerosis. No abdominopelvic
adenopathy.

Reproductive: Hysterectomy.  No adnexal mass.

Other: No significant free fluid. No free intraperitoneal air. Mild
pelvic floor laxity.

Musculoskeletal: Mild osteopenia. Lumbosacral spondylosis with disc
bulges at L4-5 and L5-S1.
IMPRESSION: 1. No evidence of adenopathy, including within the pelvis or groins.
2. No acute findings in the chest, abdomen, or pelvis.
3. Bilateral ground-glass nodules. Per consensus criteria, these
warrant follow-up with chest CT at 3-6 months. This recommendation
follows the consensus statement: Guidelines for Management of
Incidental Pulmonary Nodules Detected on CT Images:From the
[HOSPITAL] [id number]; published online before print
(10.1148/[id number]).
4. Esophageal air fluid level suggests dysmotility or
gastroesophageal reflux.
5.  Possible constipation.
6. Right renal lesion, similar in size to on the prior CT. As per
the MRI of 07/01/2019, this warrants repeat MRI to confirm
morphologic stability.
7. Suspicion of mild bladder wall thickening, as can be seen with
cystitis.
8. Aortic atherosclerosis (L1F80-85Q.Q) and emphysema (L1F80-CF6.2).

## 2023-02-06 NOTE — Progress Notes (Signed)
Cardiology Office Note:    Date:  02/09/2023   ID:  Brittney Pace, DOB 12-Aug-1955, MRN [id number]  PCP:  Josph Macho, MD   [facility] Providers Cardiologist:  Olga Millers, MD     Referring MD: Josph Macho, MD   Chief Complaint  Patient presents with   Follow-up    LE swelling    History of Present Illness:    Brittney Pace is a 68 y.o. female with a hx of  CAD s/p CABG in 1999 (LIMA-LAD, SVG-RCA), CKD with erythropoietin deficiency anemia, IDDM, history of TIAs on full dose ASA, hypothyroidism, hypertension, and echo with systolic bowing of mitral valve without frank prolapse.   Last seen in clinic 01/2022, switched from ACEI to ARB for cough. Echo reassuring.   She presents for annual follow up.  She has been hypertensive in the 160s and is taking 40 mg torsemide BID with continued lower extremity swelling.   She is concerned about cranial hypertension - she has been referred back to neurology. She does not think the tinnitus is related to fluid.   Today she is having lower extremity swelling, but they ate canned soup 2 days ago. She is on 40 mg torsemide BID.    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   Family history of adverse reaction to anesthesia    mother: " while she was under she stopped breathing."   Fibromyalgia    Gastroparesis    GERD (gastroesophageal reflux disease)    Headache    migraines   Hypothyroidism    IBS (irritable bowel syndrome)    Idiopathic edema    Iron deficiency anemia 09/04/2019   Lyme disease    Mitral valve prolapse    Myocardial infarction (HCC)    1 major in 1999 and 2 minor " small  vessel disease."   Osteoporosis    Peripheral neuropathy    Peripheral vascular disease (HCC)    Sinus disorder    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   Stroke (HCC)    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" " the second stroke was a lacunar     Past Surgical History:  Procedure Laterality Date   ABDOMINAL HYSTERECTOMY     APPENDECTOMY     BREAST SURGERY     B/L biopsy and lumpectomy    CARDIAC CATHETERIZATION     CARPAL TUNNEL RELEASE     CATARACT EXTRACTION W/ INTRAOCULAR LENS IMPLANT     right eye   COLONOSCOPY W/ BIOPSIES AND POLYPECTOMY     CORONARY ARTERY BYPASS GRAFT     coronary artery stents     at LAD and LIMA   ECTOPIC PREGNANCY SURGERY     NASAL SEPTUM SURGERY     OPEN REDUCTION INTERNAL FIXATION (ORIF) DISTAL RADIAL FRACTURE Right 05/06/2015   Procedure: OPEN REDUCTION INTERNAL FIXATION (ORIF) RIGHT DISTAL RADIAL FRACTURE AND REPAIRS AS NEEDED;  Surgeon: Bradly Bienenstock, MD;  Location: [facility] OR;  Service: Orthopedics;  Laterality: Right;   ORIF HUMERUS FRACTURE Right 04/30/2020   Procedure: OPEN REDUCTION INTERNAL  FIXATION (ORIF) PROXIMAL HUMERUS FRACTURE;  Surgeon: Francena Hanly, MD;  Location: [facility];  Service: Orthopedics;  Laterality: Right;    ORIF WRIST FRACTURE Left 11/05/2014   ORIF WRIST FRACTURE Left 11/05/2014   Procedure: OPEN REDUCTION INTERNAL FIXATION (ORIF) LEFT WRIST FRACTURE AND REPAIR AS INDICATED;  Surgeon: Bradly Bienenstock, MD;  Location: [facility] OR;  Service: Orthopedics;  Laterality: Left;   TRIGGER FINGER RELEASE      Current Medications: Current Meds  Medication Sig   ACCU-CHEK GUIDE test strip 3 (three) times daily.   Alpha-Lipoic Acid 100 MG CAPS Take 1 capsule by mouth daily.   aspirin EC 325 MG tablet Take 1 tablet (325 mg total) by mouth daily. (Patient taking differently: Take 325 mg by mouth at bedtime.)   carvedilol (COREG) 12.5 MG tablet TAKE 1 TABLET BY MOUTH TWICE DAILY WITH A MEAL   cetirizine (ZYRTEC) 10 MG tablet Take 10 mg by mouth  at bedtime.   Continuous Glucose Sensor (DEXCOM G7 SENSOR) MISC Inject 1 each into the skin as directed. Every 10 days change it.   diphenoxylate-atropine (LOMOTIL) 2.5-0.025 MG tablet TAKE 1 TABLET BY MOUTH 4 TIMES DAILY AS NEEDED FOR DIARRHEA OR  LOOSE  STOOLS (Patient taking differently: Take 1 tablet by mouth 4 (four) times daily as needed for diarrhea or loose stools.)   esomeprazole (NEXIUM) 20 MG capsule Take 20 mg by mouth every morning.    fosfomycin (MONUROL) 3 g PACK Divide packet into 4 equal doses. Take 1/4th packet by mouth with liquid twice daily for 1 day. You will have 1/2 packet left over that you will not take.   isosorbide mononitrate (IMDUR) 60 MG 24 hr tablet Take 60 mg by mouth daily.   ketoconazole (NIZORAL) 2 % shampoo Apply 1 Application topically 2 (two) times a week.   lamoTRIgine (LAMICTAL) 150 MG tablet Take 150 mg by mouth at bedtime.   Melatonin Gummies 2.5 MG CHEW Chew 2.5 mg by mouth at bedtime as needed (sleep).   mirtazapine (REMERON) 30 MG tablet Take 30 mg by mouth at bedtime.   NOVOLOG 100 UNIT/ML injection Inject 25 Units into the skin continuous.   PREMARIN vaginal cream Place 1 applicator vaginally daily as needed (irritation).   thyroid (ARMOUR THYROID) 60 MG tablet Take 1 tablet by mouth daily before breakfast.   torsemide ([name]) 20 MG tablet Take 2 tablets (40 mg total) by mouth daily. (Patient taking differently: Take 40 mg by mouth 2 (two) times daily.)   valACYclovir (VALTREX) 500 MG tablet Take 500 mg by mouth daily as needed (Flair up).   [DISCONTINUED] hydrALAZINE (APRESOLINE) 50 MG tablet Take 1 tablet (50 mg total) by mouth in the morning and at bedtime. (Patient taking differently: Take 50 mg by mouth 3 (three) times daily.)   [DISCONTINUED] nitroGLYCERIN (NITROLINGUAL) 0.4 MG/SPRAY spray Place 1 spray under the tongue every 5 (five) minutes x 3 doses as needed.     Allergies:   Morphine, Penicillins, Tramadol, Acetaminophen, Amitriptyline,  Codeine, Ibuprofen, Krill oil, Losartan, Propoxyphene, Shellfish allergy, Statins, Sulfamethoxazole, Sulfites, Fluconazole, and Norvasc [amlodipine besylate]   Social History   Socioeconomic History   Marital status: Married    Spouse name: Not on file   Number of children: 0   Years of education: college   Highest education level: Not on file  Occupational History   Occupation: Retired  Tobacco Use   Smoking status: Former    Current packs/day: 0.00    Average packs/day: 1 pack/day  for 10.0 years (10.0 ttl pk-yrs)    Types: Cigarettes    Start date: 60    Quit date: 73    Years since quitting: 40.0   Smokeless tobacco: Never   Tobacco comments:     " Quit smoking cigarettes in 20's "  Vaping Use   Vaping status: Never Used  Substance and Sexual Activity   Alcohol use: Yes    Comment: occasional beer or wine   Drug use: No   Sexual activity: Not Currently  Other Topics Concern   Not on file  Social History Narrative   Drinks 1-2  cups caffeine drinks a day    Right handed   Lives at home with husband and dog   Social Drivers of Corporate investment banker Strain: Not on file  Food Insecurity: Low Risk  (09/13/2022)   Received from [facility]   Hunger Vital Sign    Worried About Running Out of Food in the Last Year: Never true    Ran Out of Food in the Last Year: Never true  Transportation Needs: Not on file (09/13/2022)  Physical Activity: Not on file  Stress: Not on file  Social Connections: Unknown (07/08/2022)   Received from [facility]   Social Network    Social Network: Not on file     Family History: The patient's family history includes Breast cancer in her mother and sister; Heart disease in her father; Hypertension in her father; Migraines in her mother.  ROS:   Please see the history of present illness.     All other systems reviewed and are negative.  EKGs/Labs/Other Studies Reviewed:    The following studies were  reviewed today:  Echo 02/2022:  1. Left ventricular ejection fraction, by estimation, is 55 to 60%. The  left ventricle has normal function. The left ventricle has no regional  wall motion abnormalities. Left ventricular diastolic parameters were  normal.   2. Right ventricular systolic function is mildly reduced. The right  ventricular size is mildly enlarged. There is mildly elevated pulmonary  artery systolic pressure. The estimated right ventricular systolic  pressure is 42.8 mmHg.   3. The mitral valve is degenerative. Trivial mitral valve regurgitation.   4. The aortic valve is tricuspid. There is mild calcification of the  aortic valve. Aortic valve regurgitation is not visualized. Aortic valve  sclerosis is present, with no evidence of aortic valve stenosis. Aortic  valve mean gradient measures 5.0 mmHg.   5. The inferior vena cava is normal in size with <50% respiratory  variability, suggesting right atrial pressure of 8 mmHg.        Recent Labs: 03/27/2022: B Natriuretic Peptide 583.5 08/25/2022: Magnesium 2.0; TSH 0.759 01/11/2023: ALT 12; BUN 38; Creatinine 2.63; Hemoglobin 9.6; Platelet Count 338; Potassium 5.2; Sodium 127  Recent Lipid Panel    Component Value Date/Time   CHOL 204 (H) 07/20/2018 0823   TRIG 138 07/20/2018 0823   HDL 62 07/20/2018 0823   CHOLHDL 3.3 07/20/2018 0823   VLDL 17 06/29/2015 1217   LDLCALC 116 (H) 07/20/2018 0823     Risk Assessment/Calculations:             Physical Exam:    VS:  BP (!) 160/80   Pulse 67   Ht 4\' 10"  (1.473 m)   Wt 116 lb 3.2 oz (52.7 kg)   SpO2 98%   BMI 24.29 kg/m     Wt Readings from Last 3 Encounters:  02/09/23 116 lb 3.2 oz (52.7 kg)  01/11/23 114 lb (51.7 kg)  12/14/22 110 lb (49.9 kg)     GEN:  Well nourished, well developed in no acute distress HEENT: Normal NECK: No JVD; No carotid bruits LYMPHATICS: No lymphadenopathy CARDIAC: RRR, no murmurs, rubs, gallops RESPIRATORY:  Clear to  auscultation without rales, wheezing or rhonchi  ABDOMEN: Soft, non-tender, non-distended MUSCULOSKELETAL:  B LE edema SKIN: Warm and dry NEUROLOGIC:  Alert and oriented x 3 PSYCHIATRIC:  Normal affect   ASSESSMENT:    1. Medication management   2. Pain and swelling of lower extremity, unspecified laterality   3. Coronary artery disease involving native coronary artery of native heart without angina pectoris   4. Essential hypertension   5. Chronic diastolic heart failure (HCC)    PLAN:    In order of problems listed above:  CAD  Hx of CABG 1999 - on 325 mg ASA - reduced to 81 mg ASA but then had two strokes in one night, per patient - no chest pain   Hyperlipemia with LDL goal < 70 Intolerant to statins and zetia She declines PCSK9i - defer repeat lipid panel if no medication changes   Hypertension Due to cough, switched from lisinopril to avapro - she had a cough with the ARB - 50 mg hydralazine TID, 12.5 mg coreg, 60 mg imdur - she battles migraines - HR 67 - will increase hydralazine to 75 mg TID   Chronic diastolic heart failure CKD - follows with nephrology Last echo with preserved EF 2024 - takes 40 mg torsemide BID - lower extremity swelling likely related to increased sodium intake - hesitate to make changes to diuretic, she follows with nephrology   Follow up in 1 month with Dr. Jens Som.           Medication Adjustments/Labs and Tests Ordered: Current medicines are reviewed at length with the patient today.  Concerns regarding medicines are outlined above.  Orders Placed This Encounter  Procedures   Brain natriuretic peptide   Meds ordered this encounter  Medications   nitroGLYCERIN (NITROLINGUAL) 0.4 MG/SPRAY spray    Sig: Place 1 spray under the tongue every 5 (five) minutes x 3 doses as needed.    Dispense:  12 g    Refill:  3   hydrALAZINE (APRESOLINE) 50 MG tablet    Sig: Take 1.5 tablets (75 mg total) by mouth in the morning and at  bedtime.    Dispense:  90 tablet    Refill:  3    Patient Instructions  Medication Instructions:  Increase Hydralazine 75 mg twice daily   *If you need a refill on your cardiac medications before your next appointment, please call your pharmacy*   Lab Work: BNP at your convenience    Testing/Procedures: NONE ordered at this time of appointment     Follow-Up: At [facility], you and your health needs are our priority.  As part of our continuing mission to provide you with exceptional heart care, we have created designated Provider Care Teams.  These Care Teams include your primary Cardiologist (physician) and Advanced Practice Providers (APPs -  Physician Assistants and Nurse Practitioners) who all work together to provide you with the care you need, when you need it.  We recommend signing up for the patient portal called "MyChart".  Sign up information is provided on this After Visit Summary.  MyChart is used to connect with patients for Virtual Visits (Telemedicine).  Patients are able  to view lab/test results, encounter notes, upcoming appointments, etc.  Non-urgent messages can be sent to your provider as well.   To learn more about what you can do with MyChart, go to [email or web address].    Your next appointment:   1 month(s)  Provider:   Micah Flesher, PA-C        Other Instructions          Signed, Marcelino Duster, PA  02/09/2023 5:09 PM    [facility]

## 2023-02-07 LAB — LAB REPORT - SCANNED
Calcium: 8.7
Creatinine, POC: 20.3 mg/dL
EGFR: 20
PTH: 130

## 2023-02-08 ENCOUNTER — Ambulatory Visit: Payer: Medicare Other | Admitting: Hematology & Oncology

## 2023-02-08 ENCOUNTER — Inpatient Hospital Stay: Payer: Medicare Other

## 2023-02-09 ENCOUNTER — Ambulatory Visit: Payer: Medicare Other | Attending: Physician Assistant | Admitting: Physician Assistant

## 2023-02-09 ENCOUNTER — Encounter: Payer: Self-pay | Admitting: Physician Assistant

## 2023-02-09 VITALS — BP 160/80 | HR 67 | Ht <= 58 in | Wt 116.2 lb

## 2023-02-09 DIAGNOSIS — I2510 Atherosclerotic heart disease of native coronary artery without angina pectoris: Secondary | ICD-10-CM | POA: Insufficient documentation

## 2023-02-09 DIAGNOSIS — I5032 Chronic diastolic (congestive) heart failure: Secondary | ICD-10-CM | POA: Insufficient documentation

## 2023-02-09 DIAGNOSIS — Z79899 Other long term (current) drug therapy: Secondary | ICD-10-CM | POA: Diagnosis present

## 2023-02-09 DIAGNOSIS — M7989 Other specified soft tissue disorders: Secondary | ICD-10-CM | POA: Insufficient documentation

## 2023-02-09 DIAGNOSIS — I10 Essential (primary) hypertension: Secondary | ICD-10-CM | POA: Diagnosis present

## 2023-02-09 DIAGNOSIS — M79606 Pain in leg, unspecified: Secondary | ICD-10-CM | POA: Diagnosis present

## 2023-02-09 MED ORDER — NITROGLYCERIN 0.4 MG/SPRAY TL SOLN: 1.0000 | 3 refills | Status: AC | PRN

## 2023-02-09 MED ORDER — HYDRALAZINE HCL 50 MG PO TABS: 75.0000 mg | ORAL_TABLET | Freq: Two times a day (BID) | ORAL | 3 refills | Status: DC

## 2023-02-09 NOTE — Patient Instructions (Addendum)
Medication Instructions:  Increase Hydralazine 75 mg twice daily   *If you need a refill on your cardiac medications before your next appointment, please call your pharmacy*   Lab Work: BNP at your convenience    Testing/Procedures: NONE ordered at this time of appointment     Follow-Up: At [facility], you and your health needs are our priority.  As part of our continuing mission to provide you with exceptional heart care, we have created designated Provider Care Teams.  These Care Teams include your primary Cardiologist (physician) and Advanced Practice Providers (APPs -  Physician Assistants and Nurse Practitioners) who all work together to provide you with the care you need, when you need it.  We recommend signing up for the patient portal called "MyChart".  Sign up information is provided on this After Visit Summary.  MyChart is used to connect with patients for Virtual Visits (Telemedicine).  Patients are able to view lab/test results, encounter notes, upcoming appointments, etc.  Non-urgent messages can be sent to your provider as well.   To learn more about what you can do with MyChart, go to [email or web address].    Your next appointment:   1 month(s)  Provider:   Dr. Jens Som or Micah Flesher, PA-C        Other Instructions

## 2023-02-13 ENCOUNTER — Inpatient Hospital Stay: Payer: Medicare Other

## 2023-02-13 ENCOUNTER — Inpatient Hospital Stay: Payer: Medicare Other | Attending: Hematology & Oncology

## 2023-02-13 ENCOUNTER — Encounter: Payer: Self-pay | Admitting: Medical Oncology

## 2023-02-13 ENCOUNTER — Inpatient Hospital Stay (HOSPITAL_BASED_OUTPATIENT_CLINIC_OR_DEPARTMENT_OTHER): Payer: Medicare Other | Admitting: Medical Oncology

## 2023-02-13 VITALS — BP 153/62 | HR 60 | Temp 97.9°F | Resp 18 | Ht <= 58 in | Wt 111.4 lb

## 2023-02-13 DIAGNOSIS — E639 Nutritional deficiency, unspecified: Secondary | ICD-10-CM

## 2023-02-13 DIAGNOSIS — D631 Anemia in chronic kidney disease: Secondary | ICD-10-CM | POA: Insufficient documentation

## 2023-02-13 DIAGNOSIS — H539 Unspecified visual disturbance: Secondary | ICD-10-CM

## 2023-02-13 DIAGNOSIS — D638 Anemia in other chronic diseases classified elsewhere: Secondary | ICD-10-CM

## 2023-02-13 DIAGNOSIS — I129 Hypertensive chronic kidney disease with stage 1 through stage 4 chronic kidney disease, or unspecified chronic kidney disease: Secondary | ICD-10-CM | POA: Insufficient documentation

## 2023-02-13 DIAGNOSIS — D509 Iron deficiency anemia, unspecified: Secondary | ICD-10-CM

## 2023-02-13 DIAGNOSIS — N183 Chronic kidney disease, stage 3 unspecified: Secondary | ICD-10-CM

## 2023-02-13 DIAGNOSIS — E1022 Type 1 diabetes mellitus with diabetic chronic kidney disease: Secondary | ICD-10-CM | POA: Insufficient documentation

## 2023-02-13 DIAGNOSIS — N1831 Chronic kidney disease, stage 3a: Secondary | ICD-10-CM

## 2023-02-13 DIAGNOSIS — Z794 Long term (current) use of insulin: Secondary | ICD-10-CM | POA: Diagnosis not present

## 2023-02-13 DIAGNOSIS — Z8673 Personal history of transient ischemic attack (TIA), and cerebral infarction without residual deficits: Secondary | ICD-10-CM | POA: Insufficient documentation

## 2023-02-13 DIAGNOSIS — R519 Headache, unspecified: Secondary | ICD-10-CM

## 2023-02-13 DIAGNOSIS — R270 Ataxia, unspecified: Secondary | ICD-10-CM

## 2023-02-13 DIAGNOSIS — Z79899 Other long term (current) drug therapy: Secondary | ICD-10-CM | POA: Insufficient documentation

## 2023-02-13 LAB — IRON AND IRON BINDING CAPACITY (CC-WL,HP ONLY)
Iron: 48 ug/dL (ref 28–170)
Saturation Ratios: 16 % (ref 10.4–31.8)
TIBC: 295 ug/dL (ref 250–450)
UIBC: 247 ug/dL (ref 148–442)

## 2023-02-13 LAB — CBC WITH DIFFERENTIAL (CANCER CENTER ONLY)
Abs Immature Granulocytes: 0.02 10*3/uL (ref 0.00–0.07)
Basophils Absolute: 0.1 10*3/uL (ref 0.0–0.1)
Basophils Relative: 2 %
Eosinophils Absolute: 0.5 10*3/uL (ref 0.0–0.5)
Eosinophils Relative: 10 %
HCT: 33.2 % — ABNORMAL LOW (ref 36.0–46.0)
Hemoglobin: 10.8 g/dL — ABNORMAL LOW (ref 12.0–15.0)
Immature Granulocytes: 0 %
Lymphocytes Relative: 24 %
Lymphs Abs: 1.2 10*3/uL (ref 0.7–4.0)
MCH: 28.6 pg (ref 26.0–34.0)
MCHC: 32.5 g/dL (ref 30.0–36.0)
MCV: 88.1 fL (ref 80.0–100.0)
Monocytes Absolute: 0.6 10*3/uL (ref 0.1–1.0)
Monocytes Relative: 11 %
Neutro Abs: 2.7 10*3/uL (ref 1.7–7.7)
Neutrophils Relative %: 53 %
Platelet Count: 331 10*3/uL (ref 150–400)
RBC: 3.77 MIL/uL — ABNORMAL LOW (ref 3.87–5.11)
RDW: 13.4 % (ref 11.5–15.5)
WBC Count: 5.2 10*3/uL (ref 4.0–10.5)
nRBC: 0 % (ref 0.0–0.2)

## 2023-02-13 LAB — CMP (CANCER CENTER ONLY)
ALT: 8 U/L (ref 0–44)
AST: 16 U/L (ref 15–41)
Albumin: 3.3 g/dL — ABNORMAL LOW (ref 3.5–5.0)
Alkaline Phosphatase: 83 U/L (ref 38–126)
Anion gap: 7 (ref 5–15)
BUN: 41 mg/dL — ABNORMAL HIGH (ref 8–23)
CO2: 29 mmol/L (ref 22–32)
Calcium: 8.4 mg/dL — ABNORMAL LOW (ref 8.9–10.3)
Chloride: 97 mmol/L — ABNORMAL LOW (ref 98–111)
Creatinine: 2.64 mg/dL — ABNORMAL HIGH (ref 0.44–1.00)
GFR, Estimated: 19 mL/min — ABNORMAL LOW (ref >60)
Glucose, Bld: 163 mg/dL — ABNORMAL HIGH (ref 70–99)
Potassium: 4.3 mmol/L (ref 3.5–5.1)
Sodium: 133 mmol/L — ABNORMAL LOW (ref 135–145)
Total Bilirubin: 0.3 mg/dL (ref 0.0–1.2)
Total Protein: 5.4 g/dL — ABNORMAL LOW (ref 6.5–8.1)

## 2023-02-13 LAB — RETIC PANEL
RBC.: 3.82 MIL/uL — ABNORMAL LOW (ref 3.87–5.11)
Retic Ct Pct: <0.4 % — ABNORMAL LOW (ref 0.4–3.1)
Reticulocyte Hemoglobin: 31.8 pg (ref >27.9)

## 2023-02-13 LAB — VITAMIN B12: Vitamin B-12: 1225 pg/mL — ABNORMAL HIGH (ref 180–914)

## 2023-02-13 LAB — FERRITIN: Ferritin: 28 ng/mL (ref 11–307)

## 2023-02-13 MED ORDER — DARBEPOETIN ALFA 300 MCG/0.6ML IJ SOSY
300.0000 ug | PREFILLED_SYRINGE | Freq: Once | INTRAMUSCULAR | Status: AC
Start: 2023-02-13 — End: 2023-02-13
  Administered 2023-02-13: 300 ug via SUBCUTANEOUS
  Filled 2023-02-13: qty 0.6

## 2023-02-13 NOTE — Patient Instructions (Signed)

## 2023-02-13 NOTE — Progress Notes (Signed)
Hematology and Oncology Follow Up Visit  Brittney Pace [id number] [birth year] 68 y.o. 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   She is here for consideration of Aranesp for her CKD associated anemia. Unfortunately she has not been able to get this injection many times over the past few months due to elevated BP. She has recently seen her PCP and cardiologist who have helped with medication adjustments. Luckily today her BP is much better.   Today she states that she is fair. At her last visit she was having some new SOB. This is stable. She has seen her cardiologist who has made some BP medication adjustments. She feels like she is carrying less excessive fluid now which is good but still does not feel great. She is more concerned today about her continued headaches which she has had over the past year, some visual changes and worsening ataxia. All of this has worsened over the past 6 months. She has had an eye exam and this was non-concerning according to patient. She does have a neurology new patient visit but this isnt for another month. She asks if there is something we do can ahead of time. No recent falls.   She takes a daily B12 gummy supplement for her history of low/normal B12.  No fevers, night sweats, unintentional weight loss, bleeding episodes or significant bruising episodes. No chest pains, no significant peripheral edema. No visual changes. No headaches.  Currently, I would say that her performance status is probably ECOG 2-3.  Wt Readings from Last 3 Encounters:  02/13/23 111 lb 6.4 oz (50.5 kg)  02/09/23 116 lb 3.2 oz (52.7 kg)  01/11/23 114 lb (51.7 kg)    Medications:  Allergies as of 02/13/2023       Reactions   Morphine Anaphylaxis,  Shortness Of Breath   Penicillins Hives   Tolerated ANCEF on 04/30/20 Has patient had a PCN reaction causing immediate rash, facial/tongue/throat swelling, SOB or lightheadedness with hypotension: Yes Has patient had a PCN reaction causing severe rash involving mucus membranes or skin necrosis: No Has patient had a PCN reaction that required hospitalization No Has patient had a PCN reaction occurring within the last 10 years: No If all of the above answers are "NO", then may proceed with Cephalosporin use.   Tramadol Anaphylaxis   Acetaminophen Other (See Comments)   Alters insulin pump readings   Amitriptyline Other (See Comments)   Severe headache/ out of body feeling   Codeine Other (See Comments)   Severe headaches/ out of body feeling   Ibuprofen Other (See Comments)   Messes up CGM reading on glucose monitor   Krill Oil Diarrhea, Itching, Nausea And Vomiting   Losartan Cough   Propoxyphene Other (See Comments)   Severe headaches / out of body feeling   Shellfish Allergy Diarrhea, Nausea And Vomiting   Statins Other (See Comments)   Muscle pains Other reaction(s): Other   Sulfamethoxazole Other (See Comments)   Mouth ulcers   Sulfites Itching, Other (See Comments)   Mouth ulcers   Fluconazole    Other reaction(s): Contact Dermatitis (intolerance)   Norvasc [amlodipine Besylate] Swelling        Medication List        Accurate as of  February 13, 2023 12:18 PM. If you have any questions, ask your nurse or doctor.          Accu-Chek Guide test strip Generic drug: glucose blood 3 (three) times daily.   Alpha-Lipoic Acid 100 MG Caps Take 1 capsule by mouth daily.   Armour Thyroid 60 MG tablet Generic drug: thyroid Take 1 tablet by mouth daily before breakfast.   aspirin EC 325 MG tablet Take 1 tablet (325 mg total) by mouth daily. What changed: when to take this   carvedilol 12.5 MG tablet Commonly known as: COREG TAKE 1 TABLET BY MOUTH TWICE DAILY WITH A  MEAL   cetirizine 10 MG tablet Commonly known as: ZYRTEC Take 10 mg by mouth at bedtime.   Dexcom G7 Sensor Misc Inject 1 each into the skin as directed. Every 10 days change it.   diphenoxylate-atropine 2.5-0.025 MG tablet Commonly known as: LOMOTIL TAKE 1 TABLET BY MOUTH 4 TIMES DAILY AS NEEDED FOR DIARRHEA OR  LOOSE  STOOLS   esomeprazole 20 MG capsule Commonly known as: NEXIUM Take 20 mg by mouth every morning.   Fluocinolone Acetonide 0.01 % Oil Place 2 Doses in ear(s) daily.   fosfomycin 3 g Pack Commonly known as: MONUROL Divide packet into 4 equal doses. Take 1/4th packet by mouth with liquid twice daily for 1 day. You will have 1/2 packet left over that you will not take.   hydrALAZINE 50 MG tablet Commonly known as: APRESOLINE Take 1.5 tablets (75 mg total) by mouth in the morning and at bedtime. What changed: Another medication with the same name was removed. Continue taking this medication, and follow the directions you see here. Changed by: Rushie Chestnut   isosorbide mononitrate 60 MG 24 hr tablet Commonly known as: IMDUR Take 60 mg by mouth daily.   ketoconazole 2 % shampoo Commonly known as: NIZORAL Apply 1 Application topically 2 (two) times a week.   lamoTRIgine 150 MG tablet Commonly known as: LAMICTAL Take 150 mg by mouth at bedtime.   Melatonin Gummies 2.5 MG Chew Chew 2.5 mg by mouth at bedtime as needed (sleep).   mirtazapine 30 MG tablet Commonly known as: REMERON Take 30 mg by mouth at bedtime.   nitroGLYCERIN 0.4 MG/SPRAY spray Commonly known as: NITROLINGUAL Place 1 spray under the tongue every 5 (five) minutes x 3 doses as needed.   NovoLOG 100 UNIT/ML injection Generic drug: insulin aspart Inject 25 Units into the skin continuous.   Premarin vaginal cream Generic drug: conjugated estrogens Place 1 applicator vaginally daily as needed (irritation).   torsemide 20 MG tablet Commonly known as: [name] Take 2 tablets (40 mg  total) by mouth daily. What changed: when to take this   valACYclovir 500 MG tablet Commonly known as: VALTREX Take 500 mg by mouth daily as needed (Flair up).        Allergies:  Allergies  Allergen Reactions   Morphine Anaphylaxis and Shortness Of Breath   Penicillins Hives    Tolerated ANCEF on 04/30/20 Has patient had a PCN reaction causing immediate rash, facial/tongue/throat swelling, SOB or lightheadedness with hypotension: Yes Has patient had a PCN reaction causing severe rash involving mucus membranes or skin necrosis: No Has patient had a PCN reaction that required hospitalization No Has patient had a PCN reaction occurring within the last 10 years: No If all of the above answers are "NO", then may proceed with Cephalosporin use.   Tramadol Anaphylaxis   Acetaminophen Other (See Comments)  Alters insulin pump readings    Amitriptyline Other (See Comments)    Severe headache/ out of body feeling   Codeine Other (See Comments)    Severe headaches/ out of body feeling    Ibuprofen Other (See Comments)    Messes up CGM reading on glucose monitor     Krill Oil Diarrhea, Itching and Nausea And Vomiting   Losartan Cough   Propoxyphene Other (See Comments)    Severe headaches / out of body feeling    Shellfish Allergy Diarrhea and Nausea And Vomiting   Statins Other (See Comments)    Muscle pains Other reaction(s): Other   Sulfamethoxazole Other (See Comments)    Mouth ulcers    Sulfites Itching and Other (See Comments)    Mouth ulcers   Fluconazole     Other reaction(s): Contact Dermatitis (intolerance)   Norvasc [Amlodipine Besylate] Swelling    Past Medical History, Surgical history, Social history, and Family History were reviewed and updated.  Review of Systems: Review of Systems  Constitutional:  Positive for malaise/fatigue. Negative for chills and fever.  HENT: Negative.    Eyes:  Positive for blurred vision.  Respiratory:  Positive for shortness  of breath (mild).   Cardiovascular:  Negative for leg swelling.  Gastrointestinal:  Negative for abdominal pain and diarrhea.  Genitourinary:  Negative for dysuria, flank pain, frequency, hematuria and urgency.  Musculoskeletal:  Positive for joint pain and myalgias.  Skin:  Negative for rash.  Neurological:  Positive for headaches.  Endo/Heme/Allergies: Negative.   Psychiatric/Behavioral: Negative.     Wt Readings from Last 3 Encounters:  02/13/23 111 lb 6.4 oz (50.5 kg)  02/09/23 116 lb 3.2 oz (52.7 kg)  01/11/23 114 lb (51.7 kg)   Vitals:   02/13/23 1212  BP: (!) 153/62  Pulse: 60  Resp: 18  Temp: 97.9 F (36.6 C)  SpO2: 100%     Physical Exam Vitals reviewed.  Constitutional:      Comments: Gentle use of cane for ambulation   HENT:     Head: Normocephalic and atraumatic.  Eyes:     Pupils: Pupils are equal, round, and reactive to light.  Cardiovascular:     Rate and Rhythm: Normal rate and regular rhythm.     Heart sounds: Normal heart sounds.  Pulmonary:     Effort: Pulmonary effort is normal.     Breath sounds: Normal breath sounds.  Musculoskeletal:     Cervical back: Normal range of motion.     Right lower leg: No edema.     Left lower leg: No edema.  Lymphadenopathy:     Cervical: No cervical adenopathy.  Skin:    General: Skin is warm and dry.     Findings: No erythema or rash.  Neurological:     Mental Status: She is alert and oriented to person, place, and time. Mental status is at baseline.     Comments: Cranial nerves are intact  Ambulating slowly with cane  Psychiatric:        Behavior: Behavior normal.        Thought Content: Thought content normal.        Judgment: Judgment normal.     Lab Results  Component Value Date   WBC 5.2 02/13/2023   HGB 10.8 (L) 02/13/2023   HCT 33.2 (L) 02/13/2023   MCV 88.1 02/13/2023   PLT 331 02/13/2023   Lab Results  Component Value Date   FERRITIN 124 12/14/2022   IRON 107 12/14/2022  TIBC 277  12/14/2022   UIBC 170 12/14/2022   IRONPCTSAT 39 (H) 12/14/2022   Lab Results  Component Value Date   RETICCTPCT <0.4 (L) 02/13/2023   RBC 3.82 (L) 02/13/2023   RBC 3.77 (L) 02/13/2023   No results found for: "KPAFRELGTCHN", "LAMBDASER", "KAPLAMBRATIO" No results found for: "IGGSERUM", "IGA", "IGMSERUM" No results found for: "TOTALPROTELP", "ALBUMINELP", "A1GS", "A2GS", "BETS", "BETA2SER", "GAMS", "MSPIKE", "SPEI"   Chemistry      Component Value Date/Time   NA 127 (L) 01/11/2023 1351   NA 138 07/17/2017 1128   K 5.2 (H) 01/11/2023 1351   CL 92 (L) 01/11/2023 1351   CO2 28 01/11/2023 1351   BUN 38 (H) 01/11/2023 1351   BUN 23 07/17/2017 1128   CREATININE 2.63 (H) 01/11/2023 1351   CREATININE 2.45 (H) 03/03/2022 1129      Component Value Date/Time   CALCIUM 8.7 02/06/2023 0000   ALKPHOS 80 01/11/2023 1351   AST 17 01/11/2023 1351   ALT 12 01/11/2023 1351   BILITOT 0.5 01/11/2023 1351     Encounter Diagnoses  Name Primary?   Anemia due to stage 3a chronic kidney disease (HCC) Yes   Anemia of chronic disease    CKD stage 3 due to type 1 diabetes mellitus (HCC)    Erythropoietin deficiency anemia    Iron deficiency anemia, unspecified iron deficiency anemia type    Nutritional deficiency     Impression and Plan: Brittney Pace is a very pleasant 68 yo female with multifactorial anemia. She has CKD, HTN, DM1.   Today we discussed her symptoms. Certainly her uncontrolled DM may be contributing but I would like to try to proceed forward with an MR brain ahead of her neurology visit.   Happy to hear that BP and fluid retention is better with current medications.   Disposition: Aranesp today for Hgb of 10.8 B12 today Brain MR order placed  RTC 4 weeks MD, labs (CBC w/,CMP, retic), +_ Aranesp   Rushie Chestnut, PA-C 1/20/202512:19 PM

## 2023-02-14 ENCOUNTER — Other Ambulatory Visit: Payer: Self-pay | Admitting: Medical Oncology

## 2023-02-14 ENCOUNTER — Encounter: Payer: Self-pay | Admitting: Medical Oncology

## 2023-02-19 ENCOUNTER — Ambulatory Visit (HOSPITAL_BASED_OUTPATIENT_CLINIC_OR_DEPARTMENT_OTHER)
Admission: RE | Admit: 2023-02-19 | Discharge: 2023-02-19 | Disposition: A | Discharge status: Home / Self Care | Payer: Medicare Other | Source: Ambulatory Visit | Attending: Medical Oncology | Admitting: Medical Oncology

## 2023-02-19 DIAGNOSIS — R519 Headache, unspecified: Secondary | ICD-10-CM | POA: Insufficient documentation

## 2023-02-19 DIAGNOSIS — R270 Ataxia, unspecified: Secondary | ICD-10-CM | POA: Insufficient documentation

## 2023-02-19 DIAGNOSIS — H539 Unspecified visual disturbance: Secondary | ICD-10-CM | POA: Diagnosis present

## 2023-02-26 ENCOUNTER — Encounter: Payer: Self-pay | Admitting: Hematology & Oncology

## 2023-03-01 ENCOUNTER — Other Ambulatory Visit: Payer: Self-pay | Admitting: Medical Oncology

## 2023-03-01 ENCOUNTER — Ambulatory Visit: Payer: Medicare Other | Admitting: Neurology

## 2023-03-01 ENCOUNTER — Encounter: Payer: Self-pay | Admitting: Medical Oncology

## 2023-03-01 ENCOUNTER — Encounter: Payer: Self-pay | Admitting: Family

## 2023-03-01 ENCOUNTER — Encounter: Payer: Self-pay | Admitting: Neurology

## 2023-03-01 VITALS — BP 150/62 | HR 69 | Ht 59.0 in | Wt 98.0 lb

## 2023-03-01 DIAGNOSIS — R413 Other amnesia: Secondary | ICD-10-CM | POA: Diagnosis not present

## 2023-03-01 DIAGNOSIS — R9089 Other abnormal findings on diagnostic imaging of central nervous system: Secondary | ICD-10-CM

## 2023-03-01 DIAGNOSIS — H539 Unspecified visual disturbance: Secondary | ICD-10-CM

## 2023-03-01 DIAGNOSIS — G8929 Other chronic pain: Secondary | ICD-10-CM

## 2023-03-01 DIAGNOSIS — R69 Illness, unspecified: Secondary | ICD-10-CM | POA: Diagnosis not present

## 2023-03-01 DIAGNOSIS — R270 Ataxia, unspecified: Secondary | ICD-10-CM

## 2023-03-01 DIAGNOSIS — F419 Anxiety disorder, unspecified: Secondary | ICD-10-CM

## 2023-03-01 DIAGNOSIS — R519 Headache, unspecified: Secondary | ICD-10-CM

## 2023-03-01 DIAGNOSIS — F32A Depression, unspecified: Secondary | ICD-10-CM

## 2023-03-01 NOTE — Patient Instructions (Signed)
  Formal Memory Testing with Dr. Elmer Hackney or Dr. Raylene Calamity Mcdermott for formal memory testing

## 2023-03-01 NOTE — Progress Notes (Signed)
 [facility]    Provider:  Dr Ines Requesting Provider: Herlinda Burnard Garre,* Primary Care Provider:  Timmy Maude SAUNDERS, MD  CC:  memory loss  03/02/2023: Patient is is here for memory loss. Complicated past medical h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 associated with type 2 diabetes mellitus (HCC); DKA, type 1 (HCC); Vitreous hemorrhage of left eye (HCC); Vitreous syneresis of both eyes; Combined form of age-related cataract, left eye; Lacunar stroke (HCC); Memory loss; Depression; Brain atrophy (HCC); Chronic headache; CKD stage 3 due to type 1 diabetes mellitus (HCC); Hypertension associated with diabetes (HCC); Acquired hypothyroidism; Arthritis; Asthma; Carpal tunnel syndrome; Cervical spondylosis with radiculopathy; Closed displaced intra-articular fracture of right calcaneus; Corneal epithelial basement membrane dystrophy; Dizziness; Gastroesophageal reflux disease; Pseudophakia, right eye; Seasonal affective disorder (HCC); Sepsis with metabolic encephalopathy (HCC); Small intestinal bacterial overgrowth; Tinnitus, right; Spondylolisthesis of cervical region; Erythropoietin  deficiency anemia; DNR (do not resuscitate); Iron deficiency anemia; History of multiple cerebrovascular accidents (CVAs); Herpes genitalis; History of hyperbaric oxygen therapy; OAB (overactive bladder);  Recurrent UTI; Vaginal atrophy; Chronic migraine without aura, with intractable migraine, so stated, with status migrainosus; Urinary frequency; Generalized weakness; Transaminitis; Hyponatremia; Hyperkalemia; DKA (diabetic ketoacidosis) (HCC); Acute kidney injury superimposed on chronic kidney disease (HCC); HTN (hypertension); Acute on chronic diastolic CHF (congestive heart failure) (HCC); CKD (chronic kidney disease) stage 4, GFR 15-29 ml/min (HCC); Fibromyalgia; and Hypothyroidism on their problem list. MMSE 30/30. On 05/04/2022 Geriatrics mentioned Mild Dementia. She is here for memory changes. She is hee with ehr husband. She states she has been feeling memory changes since her 33s. She used to do the taxes. They moved here in 2014. Husband states she is withdrawing, that is his major problem. He is concerned about her not being social for 10 years since th pandemic. He feels its because of medical problems, low immunity specifically she mentions the type 1 diabetes, she states her immunity is terrible after getting lyme. Mort short term memory loss, she forgets where things are, he noticed a decline memory about 5 years ago and progressive, she will ask the same things over even within the hours, she writes her appointment down in a book and she compensates with that, not getting lost, no problems with visuospatial, she used to take care of all the bills and husband took over. Her cooking ability is gone, patient states she doesn't have the energy, husband says her temper is very short.  No FHx of dementia or alzheimer's. She has incontinence she has had a complete hysterectomy at age 40.    B12 354 12/14/2022 nml TSH 09/29/2022 1.178 nml HgbA1c 9.3 08/04/2022  Patient complains of symptoms per HPI as well as the following symptoms: depression,anxiety,chronic pain . Pertinent negatives and positives per HPI. All others negative  HPI 04/01/2020:  Brittney Pace is a 68 y.o. female here as requested by  Herlinda Burnard Garre,* for migraines.  This is a patient with an extremely complicated past medical history including coronary artery disease, cerebral infarction, heart failure, hypertension, asthma, IBS, tardive dyskinesias, abnormal  MRI of the brain with moderate to advanced white matter changes and atrophy, diabetes type 1 with multiple complications, hypothyroidism, CKD, chronic neck pain, neuropathy, degenerative disc disease multiple levels of the spine, anemia, history of Lyme disease, depression, memory loss, chronic headaches and migraines, dizziness, multiple strokes.  She is here with her husband who also provides information.  Very difficult and extended visit, patient is tangential and difficult to redirect but very pleasant.  She has a long history of migraines with pulsating pounding throbbing, unilateral, light sensitivity, nausea, worsening recently over the last year the headaches are daily. Her blood pressures are elevated. She had lyme disease and babesia in 2009. She was treated for this 5 years later. She has had headaches daily for over a year, after the stroke she started getting headaches. She does not wake up with them.  They do not wake her up. She has a headache now, on the left side above and around the eye, tightening, mostly pressure, They do not last all day long. Sound doesn't bother her. Very tangential. 8/10 every day. Her husband is here, caffeine seems to help. Very tangential. Poor historian. Gabapentin  helps. She gets dizziness with the gabapentin  so she only takes it at night. No significant snoring. No aura. No medication overuse. Ongoing for years at this frequency and severity however not positional and no vision changes or other red flags. No other focal neurologic deficits, associated symptoms, inciting events or modifiable factors.  Reviewed notes, labs and imaging from outside physicians, which showed:  MRI brain 02/08/2016: Personally reviewed images and  agree  IMPRESSION:  This MRI of the brain without contrast shows the following: 1.    Moderately severe cortical atrophy 2.    Mild chronic microvascular ischemic changes and evidence of small chronic lacunar infarctions in the pons 3.    Mild maxillary chronic sinusitis. 4.    There are no acute findings.  From a thorough review of records, medications tried that can be used in migraine management include: Amlodipine , aspirin , carvedilol , Flexeril , Prozac , gabapentin , melatonin, magnesium  citrate, Robaxin , Zofran , Phenergan , Topamax, amitriptyline, tramadol,   Review of Systems: Patient complains of symptoms per HPI as well as the following symptoms tardive dyskinesias, headache. Pertinent negatives and positives per HPI. All others negative.   Social History   Socioeconomic History   Marital status: Married    Spouse name: Not on file   Number of children: 0   Years of education: college   Highest education level: Not on file  Occupational History   Occupation: Retired  Tobacco Use   Smoking status: Former    Current packs/day: 0.00    Average packs/day: 1 pack/day for 10.0 years (10.0 ttl pk-yrs)    Types: Cigarettes    Start date: 17    Quit date: 1985    Years since quitting: 40.1   Smokeless tobacco: Never   Tobacco comments:      Quit smoking cigarettes in 20's   Vaping Use   Vaping status: Never Used  Substance and Sexual Activity   Alcohol use: Yes    Comment: occasional beer or wine   Drug use: No   Sexual activity: Not Currently  Other Topics Concern   Not on file  Social History Narrative   Drinks 1-2  cups caffeine drinks a day    Right handed   Lives at home with husband and dog   Social Drivers of Corporate Investment Banker Strain: Not on [employer]  Insecurity: Low Risk  (09/13/2022)   Received from [facility]   Hunger Vital Sign    Worried About Running Out of Food in the Last Year: Never true    Ran Out of Food in the Last Year: Never  true  Transportation Needs: Not on file (09/13/2022)  Physical Activity: Not on file  Stress: Not on file  Social Connections: Unknown (07/08/2022)   Received from [facility], [facility]   Social Network    Social Network: Not on file  Intimate Partner Violence: Unknown (07/08/2022)   Received from [facility]   HITS    Physically Hurt: Not on file    Insult or Talk Down To: Not on file    Threaten Physical Harm: Not on file    Scream or Curse: Not on file    Family History  Problem Relation Age of Onset   Breast cancer Mother    Migraines Mother    Heart disease Father    Hypertension Father    Breast cancer Sister     Past Medical History:  Diagnosis Date   Anemia    Arthritis    Babesiasis    secondary due to lyme disease   CHF (congestive heart failure) (HCC)    Chronic kidney disease    stage 3   Coronary artery disease    Depression    Diabetes mellitus without complication (HCC)    Type 1   Diabetic retinopathy (HCC)    Erythropoietin  deficiency anemia 10/22/2018   Family history of adverse reaction to anesthesia    mother:  while she was under she stopped breathing.   Fibromyalgia    Gastroparesis    GERD (gastroesophageal reflux disease)    Headache    migraines   Hypothyroidism    IBS (irritable bowel syndrome)    Idiopathic edema    Iron deficiency anemia 09/04/2019   Lyme disease    Mitral valve prolapse    Myocardial infarction (HCC)    1 major in 1999 and 2 minor  small vessel disease.   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   Stroke [facility])    x2  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  the second stroke was a lacunar     Patient Active Problem List   Diagnosis Date Noted   Acute on chronic diastolic CHF (congestive heart failure) (HCC) 03/27/2022   CKD (chronic kidney disease) stage 4, GFR 15-29 ml/min (HCC) 03/27/2022    Fibromyalgia 03/27/2022   Hypothyroidism 03/27/2022   Transaminitis 03/18/2022   Hyponatremia 03/18/2022   Hyperkalemia 03/18/2022   DKA (diabetic ketoacidosis) (HCC) 03/18/2022   Acute kidney injury superimposed on chronic kidney disease (HCC) 03/18/2022   HTN (hypertension) 03/18/2022   Generalized weakness 03/17/2022   Urinary frequency 10/12/2020   Chronic migraine without aura, with intractable migraine, so stated, with status migrainosus 04/01/2020   Herpes genitalis 11/07/2019   History of multiple cerebrovascular accidents (CVAs) 10/30/2019   Iron deficiency anemia 09/04/2019   History of hyperbaric oxygen therapy 08/22/2019   DNR (do not resuscitate) 12/10/2018   OAB (overactive bladder) 11/20/2018   Recurrent UTI 11/20/2018   Vaginal atrophy 11/20/2018   Dizziness 10/22/2018   Tinnitus, right 10/22/2018   Erythropoietin  deficiency anemia 10/22/2018   Arthritis 02/23/2018   Asthma 02/23/2018   Carpal tunnel syndrome 02/23/2018   Gastroesophageal reflux disease 02/23/2018  Seasonal affective disorder (HCC) 02/23/2018   Sepsis with metabolic encephalopathy (HCC) 98/76/7980   Hypertension associated with diabetes (HCC) 02/14/2017   Brain atrophy (HCC) 05/24/2016   Chronic headache 05/24/2016   Lacunar stroke (HCC) 01/20/2016   Memory loss 01/20/2016   Depression 01/20/2016   Babesiosis 12/16/2015   Lyme disease 12/16/2015   Small intestinal bacterial overgrowth 11/19/2015   Hyperlipidemia 09/25/2015   Closed displaced intra-articular fracture of right calcaneus 06/16/2015   Myopia of both eyes with astigmatism and presbyopia 05/21/2015   Vitreous syneresis of both eyes 05/21/2015   Combined form of age-related cataract, left eye 05/21/2015   Corneal epithelial basement membrane dystrophy 05/21/2015   Pseudophakia, right eye 05/21/2015   CAD (coronary artery disease) 11/19/2014   Diabetes mellitus type I (HCC) 11/19/2014   DDD (degenerative disc disease), cervical  03/11/2014   Cervical spondylosis with radiculopathy 03/11/2014   Spondylolisthesis of cervical region 03/11/2014   Vitreous hemorrhage of left eye (HCC) 03/03/2014   DDD (degenerative disc disease), thoracic 01/07/2014   Anemia due to stage 3 chronic kidney disease (HCC) 12/16/2013   Osteoporosis 11/14/2013   Cataract due to secondary diabetes (HCC) 08/26/2013   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 associated with type 2 diabetes mellitus (HCC) 08/26/2013   Hypothyroidism due to acquired atrophy of thyroid  08/02/2013   Acquired hypothyroidism 08/02/2013   Cerebral artery occlusion with cerebral infarction (HCC) 07/14/2013   History of diabetic gastroparesis 04/09/2013   Irritable bowel syndrome with diarrhea 04/09/2013   Anemia of chronic disease 04/08/2013   DKA, type 1 (HCC) 04/08/2013   CKD stage 3 due to type 1 diabetes mellitus (HCC) 04/08/2013    Past Surgical History:  Procedure Laterality Date   ABDOMINAL HYSTERECTOMY     APPENDECTOMY     BREAST SURGERY     B/L biopsy and lumpectomy    CARDIAC CATHETERIZATION     CARPAL TUNNEL RELEASE     CATARACT EXTRACTION W/ INTRAOCULAR LENS IMPLANT     right eye   COLONOSCOPY W/ BIOPSIES AND POLYPECTOMY     CORONARY ARTERY BYPASS GRAFT     coronary artery stents     at [place] and [place]   ECTOPIC PREGNANCY SURGERY     NASAL SEPTUM SURGERY     OPEN REDUCTION INTERNAL FIXATION (ORIF) DISTAL RADIAL FRACTURE Right 05/06/2015   Procedure: OPEN REDUCTION INTERNAL FIXATION (ORIF) RIGHT DISTAL RADIAL FRACTURE AND REPAIRS AS NEEDED;  Surgeon: Prentice Pagan, MD;  Location: [facility] OR;  Service: Orthopedics;  Laterality: Right;   ORIF HUMERUS FRACTURE Right 04/30/2020   Procedure: OPEN REDUCTION INTERNAL FIXATION (ORIF) PROXIMAL HUMERUS FRACTURE;  Surgeon: Melita Drivers, MD;  Location: [facility] ORS;  Service: Orthopedics;  Laterality: Right;    ORIF WRIST FRACTURE Left 11/05/2014   ORIF WRIST FRACTURE Left 11/05/2014    Procedure: OPEN REDUCTION INTERNAL FIXATION (ORIF) LEFT WRIST FRACTURE AND REPAIR AS INDICATED;  Surgeon: Prentice Pagan, MD;  Location: [facility] OR;  Service: Orthopedics;  Laterality: Left;   TRIGGER FINGER RELEASE      Current Outpatient Medications  Medication Sig Dispense Refill   ACCU-CHEK GUIDE test strip 3 (three) times daily.     Alpha-Lipoic Acid 100 MG CAPS Take 1 capsule by mouth daily.     aspirin  EC 325 MG tablet Take 1 tablet (325 mg total) by mouth daily. (Patient taking differently: Take 325 mg by mouth at bedtime.) 30 tablet 0   carvedilol  (COREG ) 12.5 MG tablet TAKE 1 TABLET BY MOUTH  TWICE DAILY WITH A MEAL 180 tablet 3   cetirizine (ZYRTEC) 10 MG tablet Take 10 mg by mouth at bedtime.     Continuous Glucose Sensor (DEXCOM G7 SENSOR) MISC Inject 1 each into the skin as directed. Every 10 days change it.     diphenoxylate -atropine  (LOMOTIL ) 2.5-0.025 MG tablet TAKE 1 TABLET BY MOUTH 4 TIMES DAILY AS NEEDED FOR DIARRHEA OR  LOOSE  STOOLS 45 tablet 0   esomeprazole (NEXIUM) 20 MG capsule Take 20 mg by mouth every morning.      hydrALAZINE  (APRESOLINE ) 50 MG tablet Take 1.5 tablets (75 mg total) by mouth in the morning and at bedtime. 90 tablet 3   isosorbide  mononitrate (IMDUR ) 60 MG 24 hr tablet Take 60 mg by mouth daily.     ketoconazole  (NIZORAL ) 2 % shampoo Apply 1 Application topically 2 (two) times a week.     lamoTRIgine  (LAMICTAL ) 150 MG tablet Take 150 mg by mouth at bedtime.     Melatonin Gummies 2.5 MG CHEW Chew 2.5 mg by mouth at bedtime as needed (sleep).     mirtazapine  (REMERON ) 30 MG tablet Take 30 mg by mouth at bedtime.     nitroGLYCERIN  (NITROLINGUAL ) 0.4 MG/SPRAY spray Place 1 spray under the tongue every 5 (five) minutes x 3 doses as needed. 12 g 3   NOVOLOG  100 UNIT/ML injection Inject 25 Units into the skin continuous.     PREMARIN  vaginal cream Place 1 applicator vaginally daily as needed (irritation).     thyroid  (ARMOUR THYROID ) 60 MG tablet Take 1 tablet by  mouth daily before breakfast.     torsemide  ([name] ) 20 MG tablet Take 2 tablets (40 mg total) by mouth daily. (Patient taking differently: Take 40 mg by mouth 2 (two) times daily.)     valACYclovir  (VALTREX ) 500 MG tablet Take 500 mg by mouth daily as needed (Flair up).     No current facility-administered medications for this visit.   Facility-Administered Medications Ordered in Other Visits  Medication Dose Route Frequency Provider Last Rate Last Admin   Darbepoetin Alfa  (ARANESP ) injection 300 mcg  300 mcg Subcutaneous Once Ennever, Peter R, MD        Allergies as of 03/01/2023 - Review Complete 03/01/2023  Allergen Reaction Noted   Morphine  Anaphylaxis and Shortness Of Breath 02/07/2013   Penicillins Hives 02/07/2013   Tramadol Anaphylaxis 11/19/2014   Acetaminophen  Other (See Comments) 11/06/2014   Amitriptyline Other (See Comments) 02/07/2013   Codeine Other (See Comments) 02/07/2013   Ibuprofen Other (See Comments) 05/05/2015   Krill oil Diarrhea, Itching, and Nausea And Vomiting 01/24/2018   Losartan Cough 11/20/2018   Propoxyphene Other (See Comments) 02/07/2013   Shellfish allergy Diarrhea and Nausea And Vomiting 07/12/2017   Statins Other (See Comments) 02/07/2013   Sulfamethoxazole Other (See Comments) 02/07/2013   Sulfites Itching and Other (See Comments) 01/24/1990   Fluconazole  07/17/2020   Norvasc  [amlodipine  besylate] Swelling 09/08/2020    Vitals: BP (!) 150/62 (BP Location: Left Arm, Patient Position: Sitting, Cuff Size: Normal)   Pulse 69   Ht 4' 11 (1.499 m)   Wt 98 lb (44.5 kg)   BMI 19.79 kg/m  Last Weight:  Wt Readings from Last 1 Encounters:  03/01/23 98 lb (44.5 kg)   Last Height:   Ht Readings from Last 1 Encounters:  03/01/23 4' 11 (1.499 m)    Physical exam: Exam: Gen: NAD, conversant, well nourised,  well groomed  CV: RRR, no MRG. No Carotid Bruits. No peripheral edema, warm, nontender Eyes: Conjunctivae clear  without exudates or hemorrhage  Neuro: Detailed Neurologic Exam  Speech:    Speech is normal; fluent and spontaneous with normal comprehension.  Cognition:     03/01/2023    1:20 PM  MMSE - Mini Mental State Exam  Orientation to time 5  Orientation to Place 5  Registration 3  Attention/ Calculation 5  Recall 3  Language- name 2 objects 2  Language- repeat 1  Language- follow 3 step command 3  Language- read & follow direction 1  Write a sentence 1  Copy design 1  Total score 30       The patient is oriented to person, place, and time;     recent and remote memory intact;     language fluent;     normal attention, concentration,     fund of knowledge Cranial Nerves:    The pupils are equal, round, and reactive to light. Pupils too small to visualize fundi. Visual fields are full to finger confrontation. Extraocular movements are intact. Trigeminal sensation is intact and the muscles of mastication are normal. The face is symmetric. The palate elevates in the midline. Hearing intact. Voice is normal. Shoulder shrug is normal. The tongue has normal motion without fasciculations.   Coordination: normal  Gait: Antalgic with cane(Left hip pain) Good clearance Good stance (not narrow)  Motor Observation: Tardive dyskinesias Tone:    Normal muscle tone.    Posture:    Posture is normal. normal erect    Strength: Motor limited by pain but appears symmetrical without focal weakness     Sensation: Neg romberg     Reflex Exam:  DTR's: Absent AJs Toes:    The toes are equiv bilaterally.   Clonus:    Clonus is absent.       Assessment/Plan:   Patient is is here for memory loss. Complicated past medical history: has CAD (coronary artery disease); Diabetes mellitus type I (HCC); Anemia due to stage 3 chronic kidney disease (HCC); Anemia of chronic disease; Babesiosis; Cataract due to secondary diabetes (HCC); Cerebral artery occlusion with cerebral infarction (HCC);  DDD (degenerative disc disease), cervical; DDD (degenerative disc disease), thoracic; History of diabetic gastroparesis; Hyperlipidemia; Hypothyroidism due to acquired atrophy of thyroid ; Irritable bowel syndrome with diarrhea; Lyme disease; Myopia of both eyes with astigmatism and presbyopia; Osteoporosis; 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 associated with type 2 diabetes mellitus (HCC); DKA, type 1 (HCC); Vitreous hemorrhage of left eye (HCC); Vitreous syneresis of both eyes; Combined form of age-related cataract, left eye; Lacunar stroke (HCC); Memory loss; Depression; Brain atrophy (HCC); Chronic headache; CKD stage 3 due to type 1 diabetes mellitus (HCC); Hypertension associated with diabetes (HCC); Acquired hypothyroidism; Arthritis; Asthma; Carpal tunnel syndrome; Cervical spondylosis with radiculopathy; Closed displaced intra-articular fracture of right calcaneus; Corneal epithelial basement membrane dystrophy; Dizziness; Gastroesophageal reflux disease; Pseudophakia, right eye; Seasonal affective disorder (HCC); Sepsis with metabolic encephalopathy (HCC); Small intestinal bacterial overgrowth; Tinnitus, right; Spondylolisthesis of cervical region; Erythropoietin  deficiency anemia; DNR (do not resuscitate); Iron deficiency anemia; History of multiple cerebrovascular accidents (CVAs); Herpes genitalis; History of hyperbaric oxygen therapy; OAB (overactive bladder); Recurrent UTI; Vaginal atrophy; Chronic migraine without aura, with intractable migraine, so stated, with status migrainosus; Urinary frequency; Generalized weakness; Transaminitis; Hyponatremia; Hyperkalemia; DKA (diabetic ketoacidosis) (HCC); Acute kidney injury superimposed on chronic kidney disease (HCC); HTN (hypertension); Acute on chronic diastolic CHF (congestive heart failure) (HCC); CKD (  chronic kidney disease) stage 4, GFR 15-29 ml/min (HCC); Fibromyalgia; and Hypothyroidism on their problem list. MMSE  30/30.  She does have moderate small vessel disease and atrophy but ventricle size appears to be due to atrophy and not NPH. Difficult to assess due to so many comorbidities but I suspect her symptoms are likely due to depression, anxiety, chronic pain, complicated medical history. Will send for formal neurocognitive testing prior to further evaluation. See comparisons of MRIs that are years inbetween(2018 on the left and 2025 on the right below) below it does not appear as though the ventricle size or the chronic microvascular disease have significantly worsened over the years but will see what results of neurocognitive testing show.   Orders Placed This Encounter  Procedures   Ambulatory referral to Neuropsychology    Formal Memory Testing with Dr. Maude Rattler or Dr. Juliene Mcdermott for formal memory testing           Cc: Herlinda Burnard Nat DEWAINE Timmy Maude JONELLE, MD  Onetha Epp, MD  [facility] [address] [place], [place] [address]  Phone [phone] Fax [phone]  I spent over 60 minutes of face-to-face and non-face-to-face time with patient on the  1. Memory loss due to medical condition   2. Anxiety and depression   3. Other chronic pain   4. Multiple medical problems     diagnosis.  This included previsit chart review, lab review, study review, order entry, electronic health record documentation, patient education on the different diagnostic and therapeutic options, counseling and coordination of care, risks and benefits of management, compliance, or risk factor reduction

## 2023-03-07 ENCOUNTER — Telehealth: Payer: Self-pay | Admitting: Neurology

## 2023-03-07 NOTE — Telephone Encounter (Signed)
Referral for Neuropsychology fax to [facility] to see Dr. Clayborn Heron or Dr. Orie Fisherman. Phone: [phone], Fax: [phone]

## 2023-03-13 ENCOUNTER — Other Ambulatory Visit: Payer: Self-pay

## 2023-03-13 DIAGNOSIS — D631 Anemia in chronic kidney disease: Secondary | ICD-10-CM

## 2023-03-14 ENCOUNTER — Other Ambulatory Visit: Payer: Self-pay

## 2023-03-14 ENCOUNTER — Inpatient Hospital Stay: Payer: Medicare Other

## 2023-03-14 ENCOUNTER — Encounter: Payer: Self-pay | Admitting: Hematology & Oncology

## 2023-03-14 ENCOUNTER — Inpatient Hospital Stay (HOSPITAL_BASED_OUTPATIENT_CLINIC_OR_DEPARTMENT_OTHER): Payer: Medicare Other | Admitting: Hematology & Oncology

## 2023-03-14 ENCOUNTER — Inpatient Hospital Stay: Payer: Medicare Other | Attending: Hematology & Oncology

## 2023-03-14 VITALS — BP 167/79 | HR 66 | Temp 97.9°F | Resp 18 | Ht 59.0 in | Wt 111.0 lb

## 2023-03-14 DIAGNOSIS — Z794 Long term (current) use of insulin: Secondary | ICD-10-CM | POA: Insufficient documentation

## 2023-03-14 DIAGNOSIS — Z8673 Personal history of transient ischemic attack (TIA), and cerebral infarction without residual deficits: Secondary | ICD-10-CM | POA: Diagnosis not present

## 2023-03-14 DIAGNOSIS — Z79899 Other long term (current) drug therapy: Secondary | ICD-10-CM | POA: Diagnosis not present

## 2023-03-14 DIAGNOSIS — I129 Hypertensive chronic kidney disease with stage 1 through stage 4 chronic kidney disease, or unspecified chronic kidney disease: Secondary | ICD-10-CM | POA: Insufficient documentation

## 2023-03-14 DIAGNOSIS — E1059 Type 1 diabetes mellitus with other circulatory complications: Secondary | ICD-10-CM

## 2023-03-14 DIAGNOSIS — D508 Other iron deficiency anemias: Secondary | ICD-10-CM

## 2023-03-14 DIAGNOSIS — D631 Anemia in chronic kidney disease: Secondary | ICD-10-CM

## 2023-03-14 DIAGNOSIS — N1832 Chronic kidney disease, stage 3b: Secondary | ICD-10-CM | POA: Diagnosis not present

## 2023-03-14 DIAGNOSIS — N183 Chronic kidney disease, stage 3 unspecified: Secondary | ICD-10-CM

## 2023-03-14 DIAGNOSIS — N1831 Chronic kidney disease, stage 3a: Secondary | ICD-10-CM | POA: Diagnosis present

## 2023-03-14 DIAGNOSIS — E1022 Type 1 diabetes mellitus with diabetic chronic kidney disease: Secondary | ICD-10-CM | POA: Insufficient documentation

## 2023-03-14 LAB — CMP (CANCER CENTER ONLY)
ALT: 11 U/L (ref 0–44)
AST: 18 U/L (ref 15–41)
Albumin: 3.4 g/dL — ABNORMAL LOW (ref 3.5–5.0)
Alkaline Phosphatase: 86 U/L (ref 38–126)
Anion gap: 8 (ref 5–15)
BUN: 52 mg/dL — ABNORMAL HIGH (ref 8–23)
CO2: 27 mmol/L (ref 22–32)
Calcium: 8.9 mg/dL (ref 8.9–10.3)
Chloride: 99 mmol/L (ref 98–111)
Creatinine: 2.98 mg/dL — ABNORMAL HIGH (ref 0.44–1.00)
GFR, Estimated: 17 mL/min — ABNORMAL LOW (ref >60)
Glucose, Bld: 153 mg/dL — ABNORMAL HIGH (ref 70–99)
Potassium: 5.3 mmol/L — ABNORMAL HIGH (ref 3.5–5.1)
Sodium: 134 mmol/L — ABNORMAL LOW (ref 135–145)
Total Bilirubin: 0.3 mg/dL (ref 0.0–1.2)
Total Protein: 5.5 g/dL — ABNORMAL LOW (ref 6.5–8.1)

## 2023-03-14 LAB — CBC WITH DIFFERENTIAL (CANCER CENTER ONLY)
Abs Immature Granulocytes: 0.08 10*3/uL — ABNORMAL HIGH (ref 0.00–0.07)
Basophils Absolute: 0.1 10*3/uL (ref 0.0–0.1)
Basophils Relative: 1 %
Eosinophils Absolute: 0.4 10*3/uL (ref 0.0–0.5)
Eosinophils Relative: 6 %
HCT: 35.4 % — ABNORMAL LOW (ref 36.0–46.0)
Hemoglobin: 11.4 g/dL — ABNORMAL LOW (ref 12.0–15.0)
Immature Granulocytes: 1 %
Lymphocytes Relative: 21 %
Lymphs Abs: 1.5 10*3/uL (ref 0.7–4.0)
MCH: 27.3 pg (ref 26.0–34.0)
MCHC: 32.2 g/dL (ref 30.0–36.0)
MCV: 84.9 fL (ref 80.0–100.0)
Monocytes Absolute: 0.8 10*3/uL (ref 0.1–1.0)
Monocytes Relative: 11 %
Neutro Abs: 4.3 10*3/uL (ref 1.7–7.7)
Neutrophils Relative %: 60 %
Platelet Count: 330 10*3/uL (ref 150–400)
RBC: 4.17 MIL/uL (ref 3.87–5.11)
RDW: 15 % (ref 11.5–15.5)
WBC Count: 7.1 10*3/uL (ref 4.0–10.5)
nRBC: 0 % (ref 0.0–0.2)

## 2023-03-14 LAB — RETICULOCYTES
Immature Retic Fract: 2.8 % (ref 2.3–15.9)
RBC.: 4.18 MIL/uL (ref 3.87–5.11)
Retic Count, Absolute: 19.6 10*3/uL (ref 19.0–186.0)
Retic Ct Pct: 0.5 % (ref 0.4–3.1)

## 2023-03-14 MED ORDER — HYDROXYZINE HCL 10 MG PO TABS: 10.0000 mg | ORAL_TABLET | Freq: Four times a day (QID) | ORAL | 3 refills | Status: DC | PRN

## 2023-03-14 NOTE — Progress Notes (Signed)
Hematology and Oncology Follow Up Visit  Brittney Pace [id number] [birth year] 68 y.o. 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 is here today for follow-up.  She is doing okay.  Her renal function certainly is not doing okay.  Renal function continues to slowly go down.  Again I think a lot of this is from her diabetes.  Also, it might be from her blood pressure.  Her hemoglobin is great today.  Her hemoglobin is 11.4.  She does not need any Aranesp today.  She does have itching.  I suppose this might be from the renal insufficiency.  I will send in some Atarax (10 mg p.o. every 6 hours as needed) to see if this may help.  She has had no bleeding.  She has had some urinary frequency.  This may be from her diabetes.  She does have some swelling in the legs.  This is chronic.  She does wear compression stockings.  Her appetite is doing okay.  She is trying to watch what she eats.  Thankfully, she has had no problems with Influenza or With COVID.   Wt Readings from Last 3 Encounters:  03/14/23 111 lb (50.3 kg)  03/01/23 98 lb (44.5 kg)  02/13/23 111 lb 6.4 oz (50.5 kg)    Medications:  Allergies as of 03/14/2023       Reactions   Morphine Anaphylaxis, Shortness Of Breath   Penicillins Hives   Tolerated ANCEF on 04/30/20 Has patient had a PCN reaction causing immediate rash, facial/tongue/throat swelling, SOB or lightheadedness with hypotension: Yes Has patient had a PCN reaction causing severe rash involving mucus membranes or skin necrosis: No Has patient had a PCN reaction that required hospitalization No Has patient had a PCN reaction occurring within the last 10 years: No If all of the above answers are "NO", then may proceed with Cephalosporin use.   Tramadol Anaphylaxis    Acetaminophen Other (See Comments)   Alters insulin pump readings   Amitriptyline Other (See Comments)   Severe headache/ out of body feeling   Codeine Other (See Comments)   Severe headaches/ out of body feeling   Ibuprofen Other (See Comments)   Messes up CGM reading on glucose monitor   Krill Oil Diarrhea, Itching, Nausea And Vomiting   Losartan Cough   Propoxyphene Other (See Comments)   Severe headaches / out of body feeling   Shellfish Allergy Diarrhea, Nausea And Vomiting   Statins Other (See Comments)   Muscle pains Other reaction(s): Other   Sulfamethoxazole Other (See Comments)   Mouth ulcers   Sulfites Itching, Other (See Comments)   Mouth ulcers   Fluconazole    Other reaction(s): Contact Dermatitis (intolerance)   Norvasc [amlodipine Besylate] Swelling        Medication List        Accurate as of March 14, 2023 12:52 PM. If you have any questions, ask your nurse or doctor.          Accu-Chek Guide test strip Generic drug: glucose blood 3 (three) times daily.   Alpha-Lipoic Acid 100 MG Caps Take 1 capsule by mouth daily.   Armour Thyroid 60 MG tablet Generic drug: thyroid Take 1 tablet by mouth daily before breakfast.   aspirin EC 325 MG tablet Take  1 tablet (325 mg total) by mouth daily. What changed: when to take this   carvedilol 12.5 MG tablet Commonly known as: COREG TAKE 1 TABLET BY MOUTH TWICE DAILY WITH A MEAL   cetirizine 10 MG tablet Commonly known as: ZYRTEC Take 10 mg by mouth at bedtime.   Dexcom G7 Sensor Misc Inject 1 each into the skin as directed. Every 10 days change it.   diphenoxylate-atropine 2.5-0.025 MG tablet Commonly known as: LOMOTIL TAKE 1 TABLET BY MOUTH 4 TIMES DAILY AS NEEDED FOR DIARRHEA OR  LOOSE  STOOLS   esomeprazole 20 MG capsule Commonly known as: NEXIUM Take 20 mg by mouth every morning.   hydrALAZINE 50 MG tablet Commonly known as: APRESOLINE Take 1.5 tablets (75 mg total) by mouth in the  morning and at bedtime.   hydrOXYzine 10 MG tablet Commonly known as: ATARAX Take 1 tablet (10 mg total) by mouth every 6 (six) hours as needed. Started by: Josph Macho   isosorbide mononitrate 60 MG 24 hr tablet Commonly known as: IMDUR Take 60 mg by mouth daily.   ketoconazole 2 % shampoo Commonly known as: NIZORAL Apply 1 Application topically 2 (two) times a week.   lamoTRIgine 150 MG tablet Commonly known as: LAMICTAL Take 150 mg by mouth at bedtime.   Melatonin Gummies 2.5 MG Chew Chew 2.5 mg by mouth at bedtime as needed (sleep).   mirtazapine 30 MG tablet Commonly known as: REMERON Take 30 mg by mouth at bedtime.   nitroGLYCERIN 0.4 MG/SPRAY spray Commonly known as: NITROLINGUAL Place 1 spray under the tongue every 5 (five) minutes x 3 doses as needed.   NovoLOG 100 UNIT/ML injection Generic drug: insulin aspart Inject 25 Units into the skin continuous.   Premarin vaginal cream Generic drug: conjugated estrogens Place 1 applicator vaginally daily as needed (irritation).   torsemide 20 MG tablet Commonly known as: [name] Take 2 tablets (40 mg total) by mouth daily. What changed: when to take this   valACYclovir 500 MG tablet Commonly known as: VALTREX Take 500 mg by mouth daily as needed (Flair up).        Allergies:  Allergies  Allergen Reactions   Morphine Anaphylaxis and Shortness Of Breath   Penicillins Hives    Tolerated ANCEF on 04/30/20 Has patient had a PCN reaction causing immediate rash, facial/tongue/throat swelling, SOB or lightheadedness with hypotension: Yes Has patient had a PCN reaction causing severe rash involving mucus membranes or skin necrosis: No Has patient had a PCN reaction that required hospitalization No Has patient had a PCN reaction occurring within the last 10 years: No If all of the above answers are "NO", then may proceed with Cephalosporin use.   Tramadol Anaphylaxis   Acetaminophen Other (See Comments)     Alters insulin pump readings    Amitriptyline Other (See Comments)    Severe headache/ out of body feeling   Codeine Other (See Comments)    Severe headaches/ out of body feeling    Ibuprofen Other (See Comments)    Messes up CGM reading on glucose monitor     Krill Oil Diarrhea, Itching and Nausea And Vomiting   Losartan Cough   Propoxyphene Other (See Comments)    Severe headaches / out of body feeling    Shellfish Allergy Diarrhea and Nausea And Vomiting   Statins Other (See Comments)    Muscle pains Other reaction(s): Other   Sulfamethoxazole Other (See Comments)    Mouth ulcers    Sulfites  Itching and Other (See Comments)    Mouth ulcers   Fluconazole     Other reaction(s): Contact Dermatitis (intolerance)   Norvasc [Amlodipine Besylate] Swelling    Past Medical History, Surgical history, Social history, and Family History were reviewed and updated.  Review of Systems: Review of Systems  Constitutional:  Positive for malaise/fatigue. Negative for chills and fever.  HENT: Negative.    Eyes:  Positive for blurred vision.  Respiratory:  Positive for shortness of breath (mild).   Cardiovascular:  Negative for leg swelling.  Gastrointestinal:  Negative for abdominal pain and diarrhea.  Genitourinary:  Negative for dysuria, flank pain, frequency, hematuria and urgency.  Musculoskeletal:  Positive for joint pain and myalgias.  Skin:  Negative for rash.  Neurological:  Positive for headaches.  Endo/Heme/Allergies: Negative.   Psychiatric/Behavioral: Negative.     Wt Readings from Last 3 Encounters:  03/14/23 111 lb (50.3 kg)  03/01/23 98 lb (44.5 kg)  02/13/23 111 lb 6.4 oz (50.5 kg)   Vitals:   03/14/23 1207  BP: (!) 167/79  Pulse: 66  Resp: 18  Temp: 97.9 F (36.6 C)  SpO2: 100%     Physical Exam Vitals reviewed.  Constitutional:      Comments: Gentle use of cane for ambulation   HENT:     Head: Normocephalic and atraumatic.  Eyes:     Pupils:  Pupils are equal, round, and reactive to light.  Cardiovascular:     Rate and Rhythm: Normal rate and regular rhythm.     Heart sounds: Normal heart sounds.  Pulmonary:     Effort: Pulmonary effort is normal.     Breath sounds: Normal breath sounds.  Musculoskeletal:     Cervical back: Normal range of motion.     Right lower leg: No edema.     Left lower leg: No edema.  Lymphadenopathy:     Cervical: No cervical adenopathy.  Skin:    General: Skin is warm and dry.     Findings: No erythema or rash.  Neurological:     Mental Status: She is alert and oriented to person, place, and time. Mental status is at baseline.     Comments: Cranial nerves are intact  Ambulating slowly with cane  Psychiatric:        Behavior: Behavior normal.        Thought Content: Thought content normal.        Judgment: Judgment normal.     Lab Results  Component Value Date   WBC 7.1 03/14/2023   HGB 11.4 (L) 03/14/2023   HCT 35.4 (L) 03/14/2023   MCV 84.9 03/14/2023   PLT 330 03/14/2023   Lab Results  Component Value Date   FERRITIN 28 02/13/2023   IRON 48 02/13/2023   TIBC 295 02/13/2023   UIBC 247 02/13/2023   IRONPCTSAT 16 02/13/2023   Lab Results  Component Value Date   RETICCTPCT 0.5 03/14/2023   RBC 4.17 03/14/2023   RBC 4.18 03/14/2023   No results found for: "KPAFRELGTCHN", "LAMBDASER", "KAPLAMBRATIO" No results found for: "IGGSERUM", "IGA", "IGMSERUM" No results found for: "TOTALPROTELP", "ALBUMINELP", "A1GS", "A2GS", "BETS", "BETA2SER", "GAMS", "MSPIKE", "SPEI"   Chemistry      Component Value Date/Time   NA 134 (L) 03/14/2023 1140   NA 138 07/17/2017 1128   K 5.3 (H) 03/14/2023 1140   CL 99 03/14/2023 1140   CO2 27 03/14/2023 1140   BUN 52 (H) 03/14/2023 1140   BUN 23 07/17/2017 1128  CREATININE 2.98 (H) 03/14/2023 1140   CREATININE 2.45 (H) 03/03/2022 1129      Component Value Date/Time   CALCIUM 8.9 03/14/2023 1140   CALCIUM 8.7 02/06/2023 0000   ALKPHOS 86  03/14/2023 1140   AST 18 03/14/2023 1140   ALT 11 03/14/2023 1140   BILITOT 0.3 03/14/2023 1140     Encounter Diagnoses  Name Primary?   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 Lamia is a very pleasant 68 yo female with multifactorial anemia. She has CKD, HTN, DM1.   I am happy that we do not have to give her any Aranesp today.  This might help her blood pressure.  We will see her back probably about 5 weeks now.  Maybe, we can move her appointments out a little bit longer.  I know it is a little difficult for her to get to see [place].     Josph Macho, MD 2/18/202512:52 PM

## 2023-03-21 ENCOUNTER — Other Ambulatory Visit: Payer: Self-pay | Admitting: Cardiology

## 2023-03-24 ENCOUNTER — Encounter (HOSPITAL_COMMUNITY): Payer: Self-pay | Admitting: Nephrology

## 2023-03-24 ENCOUNTER — Ambulatory Visit (HOSPITAL_COMMUNITY)
Admission: RE | Admit: 2023-03-24 | Discharge: 2023-03-24 | Disposition: A | Discharge status: Home / Self Care | Payer: Medicare Other | Attending: Nephrology | Admitting: Nephrology

## 2023-03-24 ENCOUNTER — Other Ambulatory Visit: Payer: Self-pay

## 2023-03-24 ENCOUNTER — Encounter (HOSPITAL_COMMUNITY)
Admission: RE | Disposition: A | Discharge status: Home / Self Care | Payer: Self-pay | Source: Home / Self Care | Attending: Nephrology

## 2023-03-24 DIAGNOSIS — E875 Hyperkalemia: Secondary | ICD-10-CM | POA: Insufficient documentation

## 2023-03-24 DIAGNOSIS — Z992 Dependence on renal dialysis: Secondary | ICD-10-CM | POA: Insufficient documentation

## 2023-03-24 DIAGNOSIS — Z951 Presence of aortocoronary bypass graft: Secondary | ICD-10-CM | POA: Diagnosis not present

## 2023-03-24 DIAGNOSIS — Z794 Long term (current) use of insulin: Secondary | ICD-10-CM | POA: Diagnosis not present

## 2023-03-24 DIAGNOSIS — Z79899 Other long term (current) drug therapy: Secondary | ICD-10-CM | POA: Diagnosis not present

## 2023-03-24 DIAGNOSIS — E785 Hyperlipidemia, unspecified: Secondary | ICD-10-CM | POA: Insufficient documentation

## 2023-03-24 DIAGNOSIS — M797 Fibromyalgia: Secondary | ICD-10-CM | POA: Diagnosis not present

## 2023-03-24 DIAGNOSIS — E039 Hypothyroidism, unspecified: Secondary | ICD-10-CM | POA: Diagnosis not present

## 2023-03-24 DIAGNOSIS — Z87891 Personal history of nicotine dependence: Secondary | ICD-10-CM | POA: Diagnosis not present

## 2023-03-24 DIAGNOSIS — N186 End stage renal disease: Secondary | ICD-10-CM | POA: Diagnosis present

## 2023-03-24 DIAGNOSIS — E10319 Type 1 diabetes mellitus with unspecified diabetic retinopathy without macular edema: Secondary | ICD-10-CM | POA: Insufficient documentation

## 2023-03-24 DIAGNOSIS — I2510 Atherosclerotic heart disease of native coronary artery without angina pectoris: Secondary | ICD-10-CM | POA: Diagnosis not present

## 2023-03-24 DIAGNOSIS — I5032 Chronic diastolic (congestive) heart failure: Secondary | ICD-10-CM | POA: Insufficient documentation

## 2023-03-24 DIAGNOSIS — E1051 Type 1 diabetes mellitus with diabetic peripheral angiopathy without gangrene: Secondary | ICD-10-CM | POA: Diagnosis not present

## 2023-03-24 DIAGNOSIS — K589 Irritable bowel syndrome without diarrhea: Secondary | ICD-10-CM | POA: Diagnosis not present

## 2023-03-24 DIAGNOSIS — D631 Anemia in chronic kidney disease: Secondary | ICD-10-CM | POA: Diagnosis not present

## 2023-03-24 DIAGNOSIS — E1022 Type 1 diabetes mellitus with diabetic chronic kidney disease: Secondary | ICD-10-CM | POA: Diagnosis not present

## 2023-03-24 HISTORY — PX: DIALYSIS/PERMA CATHETER INSERTION: CATH118288

## 2023-03-24 SURGERY — DIALYSIS/PERMA CATHETER INSERTION
Anesthesia: LOCAL

## 2023-03-24 MED ORDER — HEPARIN SODIUM (PORCINE) 1000 UNIT/ML IJ SOLN
INTRAMUSCULAR | Status: DC | PRN
  Administered 2023-03-24: 3200 [IU] via INTRAVENOUS

## 2023-03-24 MED ORDER — HEPARIN (PORCINE) IN NACL 1000-0.9 UT/500ML-% IV SOLN
INTRAVENOUS | Status: DC | PRN
  Administered 2023-03-24: 500 mL

## 2023-03-24 MED ORDER — SODIUM CHLORIDE 0.9 % IV SOLN: INTRAVENOUS | Status: DC

## 2023-03-24 MED ORDER — MIDAZOLAM HCL 2 MG/2ML IJ SOLN
INTRAMUSCULAR | Status: AC
  Filled 2023-03-24: qty 2

## 2023-03-24 MED ORDER — FENTANYL CITRATE (PF) 100 MCG/2ML IJ SOLN
INTRAMUSCULAR | Status: AC
  Filled 2023-03-24: qty 2

## 2023-03-24 MED ORDER — FENTANYL CITRATE (PF) 100 MCG/2ML IJ SOLN
INTRAMUSCULAR | Status: DC | PRN
  Administered 2023-03-24: 50 ug via INTRAVENOUS

## 2023-03-24 MED ORDER — LIDOCAINE HCL (PF) 1 % IJ SOLN
INTRAMUSCULAR | Status: AC
  Filled 2023-03-24: qty 30

## 2023-03-24 MED ORDER — IODIXANOL 320 MG/ML IV SOLN
INTRAVENOUS | Status: DC | PRN
  Administered 2023-03-24: 2 mL via INTRAVENOUS

## 2023-03-24 MED ORDER — MIDAZOLAM HCL 2 MG/2ML IJ SOLN
INTRAMUSCULAR | Status: DC | PRN
  Administered 2023-03-24: 1 mg via INTRAVENOUS

## 2023-03-24 MED ORDER — LIDOCAINE HCL (PF) 1 % IJ SOLN
INTRAMUSCULAR | Status: DC | PRN
  Administered 2023-03-24: 20 mL via INTRADERMAL

## 2023-03-24 MED ORDER — HEPARIN SODIUM (PORCINE) 1000 UNIT/ML IJ SOLN
INTRAMUSCULAR | Status: AC
  Filled 2023-03-24: qty 10

## 2023-03-24 SURGICAL SUPPLY — 7 items
BAG SNAP BAND KOVER 36X36 (MISCELLANEOUS) IMPLANT
CATH PALINDROME 19 SP (CATHETERS) IMPLANT
COVER DOME SNAP 22 D (MISCELLANEOUS) IMPLANT
GUIDEWIRE ZIPWIRE 035/150 ANGL (WIRE) IMPLANT
SHEATH PROBE COVER 6X72 (BAG) IMPLANT
TRAY PV CATH (CUSTOM PROCEDURE TRAY) ×1 IMPLANT
WIRE MICRO SET SILHO 5FR 7 (SHEATH) IMPLANT

## 2023-03-24 NOTE — Op Note (Signed)
 Patient presents for tunneled catheter insertion to initiate dialysis; ultrasound shows a good right IJ. The decision was made to place a RIJ tunneled dialysis catheter.  Summary:  1) Successful placement of a new 19 cm cuff to tip hemodialysis catheter (Palindrome) in the right internal jugular vein with the tip in the right atrium.  2) Recommendations to the dialysis unit TO NOT USE HEPARIN FOR THE FIRST 24 HOURS.  Description of procedure: The right neck, chest and the catheter were prepped and draped in the usual sterile fashion.  Local anesthesia was provided by injecting lidocaine 1% at the neck site overlying the desired venotomy site. Using real time ultrasound guidance, I was able to cannulate the RIJ and advance the guidewire easily into the central circulation. The wire was then manipulated and advanced into the abdominal IVC. I then blunt dissected the tissue around the 5 Fr stylet until it was freely mobile. Then, after injecting local anesthesia into the desired track and exit site I made a small incision at the exit site and tunneled a 19 cm cuff to tip Palindrome dialysis catheter, pulling it out at the venotomy site. Sequential dilation with 12, 14, and finally the peelaway sheath were done with real time fluoroscopic guidance ensuring that we were straight always lined with the wire.   Then, I inserted the catheter over the wire and through the sheath. After removing the peelaway sheath, I adjusted the catheter until the tip of the catheter was positioned in the right atrium of the heart. The cuff of the catheter was positioned in the subcutaneous tunnel with the cuff approximately 2 cm from the exit site.   Both limbs of the catheter were aspirated and flushed with excellent flow noted. Both limbs of the catheter were locked with heparin and sterile caps placed. The hub of the catheter was sutured to the chest wall with 2-0 nylon suture.   The neck incision was closed with a vertical  mattress 3-0 plain gut sutures and a purse-string 3-0 plain gut was placed at the tunnel exit site. 2-0 loose nylon sutures secured the hub to the chest wall.                         Sterile dressings were placed, and the patient returned to recovery in stable condition.  Sedation: 1 mg Versed, 50 mcg Fentanyl. Sedation time: 13 minutes  Contrast 2 mL  Monitoring: Because of the patient's comorbid conditions and sedation during the procedure, continuous EKG monitoring and O2 saturation monitoring was performed throughout the procedure by the RN. There were no abnormal arrhythmias encountered.  Complications: None.  Diagnoses:   N18.6 End stage renal disease Z99.2 Dependence on renal dialysis  Procedures Coding:  36558 Tunneled catheter insertion [id number] Ultrasound guidance 40981  Fluoroscopy guidance for catheter insertion. X9147 Contrast  Recommendations: Remove the suture in 3 weeks. 2.   Report any blood flow problems to [facility].  Discharge: The patient was discharged home in stable condition. The patient was given education regarding the care of the catheter and specific instructions in case of any problems.

## 2023-03-24 NOTE — H&P (Signed)
 Chief Complaint: Insertion of hemodialysis catheter, needing to start dialysis HPI:  68 year old woman with past medical history significant for insulin-dependent diabetes complicated by retinopathy, dyslipidemia, hypothyroidism, irritable bowel syndrome, fibromyalgia, coronary artery disease status post CABG, HFpEF and progressive chronic kidney disease now stage V.  She has been having subtle development of uremic symptoms and has been deemed to meet criteria to begin dialysis.  She denies any fever or chills and does not have any cough, sputum production or chest pain.  Her appetite has been fluctuating and she has intermittent dysgeusia.  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   Family history of adverse reaction to anesthesia    mother: " while she was under she stopped breathing."   Fibromyalgia    Gastroparesis    GERD (gastroesophageal reflux disease)    Headache    migraines   Hypothyroidism    IBS (irritable bowel syndrome)    Idiopathic edema    Iron deficiency anemia 09/04/2019   Lyme disease    Mitral valve prolapse    Myocardial infarction (HCC)    1 major in 1999 and 2 minor " small vessel disease."   Osteoporosis    Peripheral neuropathy    Peripheral vascular disease (HCC)    Sinus disorder    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   Stroke (HCC)    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" " the second stroke was a lacunar     Past Surgical History:  Procedure Laterality Date   ABDOMINAL HYSTERECTOMY     APPENDECTOMY     BREAST SURGERY     B/L biopsy and lumpectomy    CARDIAC CATHETERIZATION     CARPAL TUNNEL RELEASE     CATARACT EXTRACTION W/  INTRAOCULAR LENS IMPLANT     right eye   COLONOSCOPY W/ BIOPSIES AND POLYPECTOMY     CORONARY ARTERY BYPASS GRAFT     coronary artery stents     at LAD and LIMA   ECTOPIC PREGNANCY SURGERY     NASAL SEPTUM SURGERY     OPEN REDUCTION INTERNAL FIXATION (ORIF) DISTAL RADIAL FRACTURE Right 05/06/2015   Procedure: OPEN REDUCTION INTERNAL FIXATION (ORIF) RIGHT DISTAL RADIAL FRACTURE AND REPAIRS AS NEEDED;  Surgeon: Bradly Bienenstock, MD;  Location: [facility] OR;  Service: Orthopedics;  Laterality: Right;   ORIF HUMERUS FRACTURE Right 04/30/2020   Procedure: OPEN REDUCTION INTERNAL FIXATION (ORIF) PROXIMAL HUMERUS FRACTURE;  Surgeon: Francena Hanly, MD;  Location: [facility];  Service: Orthopedics;  Laterality: Right;    ORIF WRIST FRACTURE Left 11/05/2014   ORIF WRIST FRACTURE Left 11/05/2014   Procedure: OPEN REDUCTION INTERNAL FIXATION (ORIF) LEFT WRIST FRACTURE AND REPAIR AS INDICATED;  Surgeon: Bradly Bienenstock, MD;  Location: [facility] OR;  Service: Orthopedics;  Laterality: Left;   TRIGGER FINGER RELEASE      Family History  Problem Relation Age of Onset   Breast cancer Mother    Migraines Mother    Heart disease Father    Hypertension Father    Breast cancer Sister    Social History:  reports that  she quit smoking about 40 years ago. Her smoking use included cigarettes. She started smoking about 50 years ago. She has a 10 pack-year smoking history. She has never used smokeless tobacco. She reports current alcohol use. She reports that she does not use drugs.  Allergies:  Allergies  Allergen Reactions   Morphine Anaphylaxis and Shortness Of Breath   Penicillins Hives    Tolerated ANCEF on 04/30/20 Has patient had a PCN reaction causing immediate rash, facial/tongue/throat swelling, SOB or lightheadedness with hypotension: Yes Has patient had a PCN reaction causing severe rash involving mucus membranes or skin necrosis: No Has patient had a PCN reaction that required hospitalization No Has patient had a  PCN reaction occurring within the last 10 years: No If all of the above answers are "NO", then may proceed with Cephalosporin use.   Tramadol Anaphylaxis   Acetaminophen Other (See Comments)    Alters insulin pump readings    Amitriptyline Other (See Comments)    Severe headache/ out of body feeling   Codeine Other (See Comments)    Severe headaches/ out of body feeling    Ibuprofen Other (See Comments)    Messes up CGM reading on glucose monitor     Krill Oil Diarrhea, Itching and Nausea And Vomiting   Losartan Cough   Propoxyphene Other (See Comments)    Severe headaches / out of body feeling    Shellfish Allergy Diarrhea and Nausea And Vomiting   Statins Other (See Comments)    Muscle pains Other reaction(s): Other   Sulfamethoxazole Other (See Comments)    Mouth ulcers    Sulfites Itching and Other (See Comments)    Mouth ulcers   Fluconazole     Other reaction(s): Contact Dermatitis (intolerance)   Norvasc [Amlodipine Besylate] Swelling    Medications Prior to Admission  Medication Sig Dispense Refill   ALPHA-D-GALACTOSIDASE PO Take 600 mg by mouth daily.     aspirin EC 325 MG tablet Take 1 tablet (325 mg total) by mouth daily. (Patient taking differently: Take 325 mg by mouth at bedtime.) 30 tablet 0   carvedilol (COREG) 12.5 MG tablet TAKE 1 TABLET BY MOUTH TWICE DAILY WITH A MEAL 180 tablet 3   cetirizine (ZYRTEC) 10 MG tablet Take 10 mg by mouth at bedtime.     Darbepoetin Alfa-Albumin (ARANESP IJ) Inject as directed.     diphenoxylate-atropine (LOMOTIL) 2.5-0.025 MG tablet TAKE 1 TABLET BY MOUTH 4 TIMES DAILY AS NEEDED FOR DIARRHEA OR  LOOSE  STOOLS 45 tablet 0   esomeprazole (NEXIUM) 20 MG capsule Take 20 mg by mouth every morning.      hydrALAZINE (APRESOLINE) 50 MG tablet Take 1.5 tablets (75 mg total) by mouth in the morning and at bedtime. (Patient taking differently: Take 50 mg by mouth 3 (three) times daily.) 90 tablet 3   hydrOXYzine (ATARAX) 10 MG tablet  Take 1 tablet (10 mg total) by mouth every 6 (six) hours as needed. (Patient taking differently: Take 10 mg by mouth every 6 (six) hours as needed for itching.) 90 tablet 3   isosorbide mononitrate (IMDUR) 60 MG 24 hr tablet Take 60 mg by mouth daily.     ketoconazole (NIZORAL) 2 % shampoo Apply 1 Application topically 2 (two) times a week.     ketorolac (ACULAR) 0.5 % ophthalmic solution Place 1 drop into both eyes 2 (two) times daily.     lamoTRIgine (LAMICTAL) 150 MG tablet Take 150 mg by mouth at bedtime.  MAGNESIUM GLUCONATE PO Take 400 mg by mouth at bedtime.     Melatonin Gummies 2.5 MG CHEW Chew 2.5 mg by mouth at bedtime as needed (sleep).     mirtazapine (REMERON) 30 MG tablet Take 30 mg by mouth at bedtime.     Multiple Vitamins-Minerals (MULTIVITAMIN WITH MINERALS) tablet Take 1 tablet by mouth daily. + Zinc     nitroGLYCERIN (NITROLINGUAL) 0.4 MG/SPRAY spray Place 1 spray under the tongue every 5 (five) minutes x 3 doses as needed. 12 g 3   NOVOLOG 100 UNIT/ML injection Inject 25 Units into the skin continuous. Insulin pump     OVER THE COUNTER MEDICATION Take 1 capsule by mouth daily. Conjugated linoleic acid     prednisoLONE acetate (PRED FORTE) 1 % ophthalmic suspension Place 1 drop into both eyes 2 (two) times daily.     PREMARIN vaginal cream Place 1 applicator vaginally daily as needed (irritation).     thyroid (ARMOUR THYROID) 60 MG tablet Take 60 mg by mouth daily before breakfast.     torsemide ([name]) 20 MG tablet Take 2 tablets (40 mg total) by mouth daily. (Patient taking differently: Take 40 mg by mouth 2 (two) times daily.)     valACYclovir (VALTREX) 500 MG tablet Take 500 mg by mouth daily as needed (Flair up).     ACCU-CHEK GUIDE test strip 3 (three) times daily.     Continuous Glucose Sensor (DEXCOM G7 SENSOR) MISC Inject 1 each into the skin as directed. Every 10 days change it.      No results found for this or any previous visit (from the past 48 hours). No  results found.  Review of Systems  There were no vitals taken for this visit. Physical Exam Vitals and nursing note reviewed.  Constitutional:      Appearance: Normal appearance. She is normal weight. She is not ill-appearing.  HENT:     Head: Normocephalic and atraumatic.     Right Ear: External ear normal.     Left Ear: External ear normal.     Mouth/Throat:     Mouth: Mucous membranes are moist.     Pharynx: Oropharynx is clear.  Eyes:     Extraocular Movements: Extraocular movements intact.     Conjunctiva/sclera: Conjunctivae normal.  Cardiovascular:     Rate and Rhythm: Normal rate and regular rhythm.  Pulmonary:     Effort: Pulmonary effort is normal.     Breath sounds: Normal breath sounds.  Musculoskeletal:     Cervical back: Neck supple.     Right lower leg: Edema present.     Left lower leg: Edema present.     Comments: 1+ edema bilaterally  Neurological:     Mental Status: She is alert and oriented to person, place, and time.      Assessment/Plan 1.  Chronic kidney disease stage V: With progressive chronic kidney disease likely complicated by chronic cardiorenal syndrome.  Needing access to begin dialysis.  I described to her the procedure for insertion of a tunneled hemodialysis catheter along with its risks and benefits.  She is willing to proceed and consent obtained. 2.  Anemia: Hemoglobin and hematocrit currently within normal/acceptable range.  Resume ESA with outpatient dialysis. 3.  Hyperkalemia: Mildly hyperkalemic on labs from 10 days ago, will monitor on telemetry with conscious sedation. 4.  Insulin-dependent diabetes mellitus: With continuous glucose monitoring and will be monitored during the procedure for hypoglycemia/need for dextrose supplementation.  Dagoberto Ligas, MD 03/24/2023, 11:07  AM

## 2023-03-24 NOTE — Discharge Instructions (Signed)
 Ok to discharge home anytime after 12:30 PM as long as clinically stable

## 2023-03-29 DIAGNOSIS — I6930 Unspecified sequelae of cerebral infarction: Secondary | ICD-10-CM | POA: Insufficient documentation

## 2023-03-29 DIAGNOSIS — Z992 Dependence on renal dialysis: Secondary | ICD-10-CM | POA: Insufficient documentation

## 2023-03-29 DIAGNOSIS — I951 Orthostatic hypotension: Secondary | ICD-10-CM | POA: Insufficient documentation

## 2023-03-29 DIAGNOSIS — Z87891 Personal history of nicotine dependence: Secondary | ICD-10-CM | POA: Insufficient documentation

## 2023-03-29 DIAGNOSIS — N186 End stage renal disease: Secondary | ICD-10-CM | POA: Insufficient documentation

## 2023-03-29 DIAGNOSIS — N2581 Secondary hyperparathyroidism of renal origin: Secondary | ICD-10-CM | POA: Insufficient documentation

## 2023-03-29 DIAGNOSIS — I130 Hypertensive heart and chronic kidney disease with heart failure and stage 1 through stage 4 chronic kidney disease, or unspecified chronic kidney disease: Secondary | ICD-10-CM | POA: Insufficient documentation

## 2023-03-31 NOTE — Progress Notes (Signed)
 HPI: FU CAD. Patient previously followed at [facility]. Prior MI 1999; H/O CABG (LIMA to LAD; SVG to RCA); According to the patient, she had 2 stents to the LIMA graft and the LAD in 2000. She's had EECP treatments in [place]. Her last heart catheterization was August 2015 at [facility] which revealed two-vessel coronary disease with a patent LIMA to the LAD and SVG to the RCA. No obstructive disease in left circumflex; EF 65. Renal ultrasound February 2024 showed small simple cyst in the right kidney with a complex cyst as well and follow-up MRI is recommended.  Echocardiogram February 2024 showed normal LV function, mild RV dysfunction, mild right ventricular enlargement.  Carotid Dopplers December 2024 showed no evidence of hemodynamically significant stenosis.  Since last seen,  the patient has dyspnea with more extreme activities but not with routine activities. It is relieved with rest. It is not associated with chest pain. There is no orthopnea, PND or pedal edema. There is no syncope or palpitations. There is no exertional chest pain.   Current Outpatient Medications  Medication Sig Dispense Refill   ACCU-CHEK GUIDE test strip 3 (three) times daily.     ALPHA-D-GALACTOSIDASE PO Take 600 mg by mouth daily.     aspirin EC 325 MG tablet Take 1 tablet (325 mg total) by mouth daily. (Patient taking differently: Take 325 mg by mouth at bedtime.) 30 tablet 0   carvedilol (COREG) 12.5 MG tablet TAKE 1 TABLET BY MOUTH TWICE DAILY WITH A MEAL 180 tablet 3   cetirizine (ZYRTEC) 10 MG tablet Take 10 mg by mouth at bedtime.     Continuous Glucose Sensor (DEXCOM G7 SENSOR) MISC Inject 1 each into the skin as directed. Every 10 days change it.     Darbepoetin Alfa-Albumin (ARANESP IJ) Inject as directed.     diphenoxylate-atropine (LOMOTIL) 2.5-0.025 MG tablet TAKE 1 TABLET BY MOUTH 4 TIMES DAILY AS NEEDED FOR DIARRHEA OR  LOOSE  STOOLS 45 tablet 0   esomeprazole (NEXIUM) 20 MG capsule Take  20 mg by mouth every morning.      hydrALAZINE (APRESOLINE) 50 MG tablet Take 1.5 tablets (75 mg total) by mouth in the morning and at bedtime. (Patient taking differently: Take 50 mg by mouth 3 (three) times daily.) 90 tablet 3   hydrOXYzine (ATARAX) 10 MG tablet Take 1 tablet (10 mg total) by mouth every 6 (six) hours as needed. (Patient taking differently: Take 10 mg by mouth every 6 (six) hours as needed for itching.) 90 tablet 3   isosorbide mononitrate (IMDUR) 60 MG 24 hr tablet Take 60 mg by mouth daily.     ketoconazole (NIZORAL) 2 % shampoo Apply 1 Application topically 2 (two) times a week.     ketorolac (ACULAR) 0.5 % ophthalmic solution Place 1 drop into both eyes 2 (two) times daily.     lamoTRIgine (LAMICTAL) 150 MG tablet Take 150 mg by mouth at bedtime.     MAGNESIUM GLUCONATE PO Take 400 mg by mouth at bedtime.     Melatonin Gummies 2.5 MG CHEW Chew 2.5 mg by mouth at bedtime as needed (sleep).     mirtazapine (REMERON) 30 MG tablet Take 30 mg by mouth at bedtime.     Multiple Vitamins-Minerals (MULTIVITAMIN WITH MINERALS) tablet Take 1 tablet by mouth daily. + Zinc     nitroGLYCERIN (NITROLINGUAL) 0.4 MG/SPRAY spray Place 1 spray under the tongue every 5 (five) minutes x 3 doses as needed. 12 g 3  NOVOLOG 100 UNIT/ML injection Inject 25 Units into the skin continuous. Insulin pump     OVER THE COUNTER MEDICATION Take 1 capsule by mouth daily. Conjugated linoleic acid     prednisoLONE acetate (PRED FORTE) 1 % ophthalmic suspension Place 1 drop into both eyes 2 (two) times daily.     PREMARIN vaginal cream Place 1 applicator vaginally daily as needed (irritation).     thyroid (ARMOUR THYROID) 60 MG tablet Take 60 mg by mouth daily before breakfast.     torsemide ([name]) 20 MG tablet Take 2 tablets (40 mg total) by mouth daily. (Patient taking differently: Take 40 mg by mouth 2 (two) times daily.)     valACYclovir (VALTREX) 500 MG tablet Take 500 mg by mouth daily as needed (Flair  up).     No current facility-administered medications for this visit.   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   Family history of adverse reaction to anesthesia    mother: " while she was under she stopped breathing."   Fibromyalgia    Gastroparesis    GERD (gastroesophageal reflux disease)    Headache    migraines   Hypothyroidism    IBS (irritable bowel syndrome)    Idiopathic edema    Iron deficiency anemia 09/04/2019   Lyme disease    Mitral valve prolapse    Myocardial infarction (HCC)    1 major in 1999 and 2 minor " small vessel disease."   Osteoporosis    Peripheral neuropathy    Peripheral vascular disease (HCC)    Sinus disorder    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   Stroke (HCC)    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" " the second stroke was a lacunar     Past Surgical History:  Procedure Laterality Date   ABDOMINAL HYSTERECTOMY     APPENDECTOMY     BREAST SURGERY     B/L biopsy and lumpectomy    CARDIAC CATHETERIZATION     CARPAL TUNNEL RELEASE     CATARACT EXTRACTION W/ INTRAOCULAR LENS IMPLANT     right eye   COLONOSCOPY W/ BIOPSIES AND POLYPECTOMY     CORONARY ARTERY BYPASS GRAFT     coronary artery stents     at LAD and LIMA   DIALYSIS/PERMA CATHETER INSERTION N/A 03/24/2023   Procedure: DIALYSIS/PERMA CATHETER INSERTION;  Surgeon: Dagoberto Ligas, MD;  Location: [facility] INVASIVE CV LAB;  Service: Cardiovascular;   Laterality: N/A;   ECTOPIC PREGNANCY SURGERY     NASAL SEPTUM SURGERY     OPEN REDUCTION INTERNAL FIXATION (ORIF) DISTAL RADIAL FRACTURE Right 05/06/2015   Procedure: OPEN REDUCTION INTERNAL FIXATION (ORIF) RIGHT DISTAL RADIAL FRACTURE AND REPAIRS AS NEEDED;  Surgeon: Bradly Bienenstock, MD;  Location: [facility] OR;  Service: Orthopedics;  Laterality: Right;   ORIF HUMERUS FRACTURE Right 04/30/2020  Procedure: OPEN REDUCTION INTERNAL FIXATION (ORIF) PROXIMAL HUMERUS FRACTURE;  Surgeon: Francena Hanly, MD;  Location: [facility] ORS;  Service: Orthopedics;  Laterality: Right;    ORIF WRIST FRACTURE Left 11/05/2014   ORIF WRIST FRACTURE Left 11/05/2014   Procedure: OPEN REDUCTION INTERNAL FIXATION (ORIF) LEFT WRIST FRACTURE AND REPAIR AS INDICATED;  Surgeon: Bradly Bienenstock, MD;  Location: [facility] OR;  Service: Orthopedics;  Laterality: Left;   TRIGGER FINGER RELEASE      Social History   Socioeconomic History   Marital status: Married    Spouse name: Not on file   Number of children: 0   Years of education: college   Highest education level: Not on file  Occupational History   Occupation: Retired  Tobacco Use   Smoking status: Former    Current packs/day: 0.00    Average packs/day: 1 pack/day for 10.0 years (10.0 ttl pk-yrs)    Types: Cigarettes    Start date: 81    Quit date: 1985    Years since quitting: 40.2   Smokeless tobacco: Never   Tobacco comments:     " Quit smoking cigarettes in 20's "  Vaping Use   Vaping status: Never Used  Substance and Sexual Activity   Alcohol use: Yes    Comment: occasional beer or wine   Drug use: No   Sexual activity: Not Currently  Other Topics Concern   Not on file  Social History Narrative   Drinks 1-2  cups caffeine drinks a day    Right handed   Lives at home with husband and dog   Social Drivers of Corporate investment banker Strain: Not on file  Food Insecurity: Low Risk  (09/13/2022)   Received from [facility]   Hunger Vital Sign    Worried  About Running Out of Food in the Last Year: Never true    Ran Out of Food in the Last Year: Never true  Transportation Needs: Not on file (09/13/2022)  Physical Activity: Not on file  Stress: Not on file  Social Connections: Unknown (07/08/2022)   Received [facility]   Social Network    Social Network: Not on file  Intimate Partner Violence: Unknown (07/08/2022)   Received from [facility]   HITS    Physically Hurt: Not on file    Insult or Talk Down To: Not on file    Threaten Physical Harm: Not on file    Scream or Curse: Not on file    Family History  Problem Relation Age of Onset   Breast cancer Mother    Migraines Mother    Heart disease Father    Hypertension Father    Breast cancer Sister     ROS: no fevers or chills, productive cough, hemoptysis, dysphasia, odynophagia, melena, hematochezia, dysuria, hematuria, rash, seizure activity, orthopnea, PND, pedal edema, claudication. Remaining systems are negative.  Physical Exam: Well-developed well-nourished in no acute distress.  Skin is warm and dry.  HEENT is normal.  Neck is supple.  Chest is clear to auscultation with normal expansion.  Cardiovascular exam is regular rate and rhythm.  Abdominal exam nontender or distended. No masses palpated. Extremities show no edema. neuro grossly intact  A/P  1 coronary artery disease status post coronary artery bypass and graft-patient is doing reasonably well. Continue aspirin.  Intolerant to statins.  2 hypertension-blood pressure controlled.  Continue present medications.  We discussed now that she is on dialysis her blood pressure may  continue to decrease.  If so we will decrease medications.  3 hyperlipidemia-patient intolerant to statins and Zetia and declines PCSK9 inhibitors.  Note I explained that her recent LDL was greater than 200.  Continue diet.  4 chronic diastolic congestive heart failure-patient has been initiated on  dialysis and is euvolemic.  5 probable renal cyst-MRI was recommended.  I have asked her to follow-up with her primary care physician for this issue.  6 end-stage renal disease-dialysis has been initiated 2 weeks ago.  Follow-up nephrology.  Olga Millers, MD

## 2023-04-12 ENCOUNTER — Ambulatory Visit (INDEPENDENT_AMBULATORY_CARE_PROVIDER_SITE_OTHER): Payer: Medicare Other | Admitting: Cardiology

## 2023-04-12 ENCOUNTER — Encounter: Payer: Self-pay | Admitting: Cardiology

## 2023-04-12 VITALS — BP 122/62 | HR 72 | Ht 59.0 in | Wt 108.8 lb

## 2023-04-12 DIAGNOSIS — I5032 Chronic diastolic (congestive) heart failure: Secondary | ICD-10-CM | POA: Diagnosis not present

## 2023-04-12 DIAGNOSIS — E7800 Pure hypercholesterolemia, unspecified: Secondary | ICD-10-CM

## 2023-04-12 DIAGNOSIS — I2510 Atherosclerotic heart disease of native coronary artery without angina pectoris: Secondary | ICD-10-CM

## 2023-04-12 DIAGNOSIS — I10 Essential (primary) hypertension: Secondary | ICD-10-CM | POA: Diagnosis not present

## 2023-04-12 NOTE — Patient Instructions (Signed)
   Follow-Up: At [facility], you and your health needs are our priority.  As part of our continuing mission to provide you with exceptional heart care, we have created designated Provider Care Teams.  These Care Teams include your primary Cardiologist (physician) and Advanced Practice Providers (APPs -  Physician Assistants and Nurse Practitioners) who all work together to provide you with the care you need, when you need it.  We recommend signing up for the patient portal called "MyChart".  Sign up information is provided on this After Visit Summary.  MyChart is used to connect with patients for Virtual Visits (Telemedicine).  Patients are able to view lab/test results, encounter notes, upcoming appointments, etc.  Non-urgent messages can be sent to your provider as well.   To learn more about what you can do with MyChart, go to [email or web address].    Your next appointment:   12 month(s)  Provider:   Olga Millers, MD

## 2023-04-19 ENCOUNTER — Other Ambulatory Visit: Payer: Self-pay | Admitting: Cardiology

## 2023-04-19 ENCOUNTER — Encounter: Payer: Self-pay | Admitting: Hematology & Oncology

## 2023-04-19 DIAGNOSIS — I2510 Atherosclerotic heart disease of native coronary artery without angina pectoris: Secondary | ICD-10-CM

## 2023-04-25 ENCOUNTER — Ambulatory Visit: Payer: Medicare Other | Admitting: Hematology & Oncology

## 2023-04-25 ENCOUNTER — Inpatient Hospital Stay: Payer: Medicare Other

## 2023-04-25 ENCOUNTER — Ambulatory Visit: Payer: Medicare Other

## 2023-04-25 DIAGNOSIS — E441 Mild protein-calorie malnutrition: Secondary | ICD-10-CM | POA: Insufficient documentation

## 2023-04-26 ENCOUNTER — Inpatient Hospital Stay: Attending: Hematology & Oncology

## 2023-04-26 ENCOUNTER — Inpatient Hospital Stay (HOSPITAL_BASED_OUTPATIENT_CLINIC_OR_DEPARTMENT_OTHER): Admitting: Hematology & Oncology

## 2023-04-26 ENCOUNTER — Inpatient Hospital Stay

## 2023-04-26 ENCOUNTER — Encounter: Payer: Self-pay | Admitting: Hematology & Oncology

## 2023-04-26 VITALS — BP 133/61 | HR 65 | Temp 98.7°F | Resp 18 | Wt 108.0 lb

## 2023-04-26 DIAGNOSIS — D631 Anemia in chronic kidney disease: Secondary | ICD-10-CM

## 2023-04-26 DIAGNOSIS — N1832 Chronic kidney disease, stage 3b: Secondary | ICD-10-CM

## 2023-04-26 DIAGNOSIS — E1022 Type 1 diabetes mellitus with diabetic chronic kidney disease: Secondary | ICD-10-CM | POA: Diagnosis not present

## 2023-04-26 DIAGNOSIS — D638 Anemia in other chronic diseases classified elsewhere: Secondary | ICD-10-CM

## 2023-04-26 DIAGNOSIS — N1831 Chronic kidney disease, stage 3a: Secondary | ICD-10-CM | POA: Diagnosis present

## 2023-04-26 DIAGNOSIS — I120 Hypertensive chronic kidney disease with stage 5 chronic kidney disease or end stage renal disease: Secondary | ICD-10-CM | POA: Diagnosis not present

## 2023-04-26 DIAGNOSIS — Z794 Long term (current) use of insulin: Secondary | ICD-10-CM | POA: Diagnosis not present

## 2023-04-26 DIAGNOSIS — Z79899 Other long term (current) drug therapy: Secondary | ICD-10-CM | POA: Diagnosis not present

## 2023-04-26 DIAGNOSIS — D508 Other iron deficiency anemias: Secondary | ICD-10-CM

## 2023-04-26 DIAGNOSIS — Z992 Dependence on renal dialysis: Secondary | ICD-10-CM | POA: Insufficient documentation

## 2023-04-26 DIAGNOSIS — N186 End stage renal disease: Secondary | ICD-10-CM | POA: Diagnosis not present

## 2023-04-26 DIAGNOSIS — N183 Chronic kidney disease, stage 3 unspecified: Secondary | ICD-10-CM

## 2023-04-26 DIAGNOSIS — E1059 Type 1 diabetes mellitus with other circulatory complications: Secondary | ICD-10-CM

## 2023-04-26 DIAGNOSIS — I129 Hypertensive chronic kidney disease with stage 1 through stage 4 chronic kidney disease, or unspecified chronic kidney disease: Secondary | ICD-10-CM | POA: Diagnosis present

## 2023-04-26 LAB — CBC WITH DIFFERENTIAL (CANCER CENTER ONLY)
Abs Immature Granulocytes: 0.07 10*3/uL (ref 0.00–0.07)
Basophils Absolute: 0.1 10*3/uL (ref 0.0–0.1)
Basophils Relative: 1 %
Eosinophils Absolute: 0.5 10*3/uL (ref 0.0–0.5)
Eosinophils Relative: 8 %
HCT: 29.4 % — ABNORMAL LOW (ref 36.0–46.0)
Hemoglobin: 9.4 g/dL — ABNORMAL LOW (ref 12.0–15.0)
Immature Granulocytes: 1 %
Lymphocytes Relative: 19 %
Lymphs Abs: 1.1 10*3/uL (ref 0.7–4.0)
MCH: 27.6 pg (ref 26.0–34.0)
MCHC: 32 g/dL (ref 30.0–36.0)
MCV: 86.5 fL (ref 80.0–100.0)
Monocytes Absolute: 0.8 10*3/uL (ref 0.1–1.0)
Monocytes Relative: 14 %
Neutro Abs: 3.3 10*3/uL (ref 1.7–7.7)
Neutrophils Relative %: 57 %
Platelet Count: 298 10*3/uL (ref 150–400)
RBC: 3.4 MIL/uL — ABNORMAL LOW (ref 3.87–5.11)
RDW: 22.4 % — ABNORMAL HIGH (ref 11.5–15.5)
WBC Count: 5.8 10*3/uL (ref 4.0–10.5)
nRBC: 0 % (ref 0.0–0.2)

## 2023-04-26 LAB — FERRITIN: Ferritin: 556 ng/mL — ABNORMAL HIGH (ref 11–307)

## 2023-04-26 LAB — CMP (CANCER CENTER ONLY)
ALT: 9 U/L (ref 0–44)
AST: 18 U/L (ref 15–41)
Albumin: 3.5 g/dL (ref 3.5–5.0)
Alkaline Phosphatase: 91 U/L (ref 38–126)
Anion gap: 8 (ref 5–15)
BUN: 19 mg/dL (ref 8–23)
CO2: 32 mmol/L (ref 22–32)
Calcium: 8.3 mg/dL — ABNORMAL LOW (ref 8.9–10.3)
Chloride: 95 mmol/L — ABNORMAL LOW (ref 98–111)
Creatinine: 2.24 mg/dL — ABNORMAL HIGH (ref 0.44–1.00)
GFR, Estimated: 23 mL/min — ABNORMAL LOW (ref >60)
Glucose, Bld: 256 mg/dL — ABNORMAL HIGH (ref 70–99)
Potassium: 4.5 mmol/L (ref 3.5–5.1)
Sodium: 135 mmol/L (ref 135–145)
Total Bilirubin: 0.4 mg/dL (ref 0.0–1.2)
Total Protein: 5.6 g/dL — ABNORMAL LOW (ref 6.5–8.1)

## 2023-04-26 LAB — RETICULOCYTES
Immature Retic Fract: 11.5 % (ref 2.3–15.9)
RBC.: 3.42 MIL/uL — ABNORMAL LOW (ref 3.87–5.11)
Retic Count, Absolute: 86.5 10*3/uL (ref 19.0–186.0)
Retic Ct Pct: 2.5 % (ref 0.4–3.1)

## 2023-04-26 MED ORDER — DARBEPOETIN ALFA 300 MCG/0.6ML IJ SOSY
300.0000 ug | PREFILLED_SYRINGE | Freq: Once | INTRAMUSCULAR | Status: AC
  Administered 2023-04-26: 300 ug via SUBCUTANEOUS
  Filled 2023-04-26: qty 0.6

## 2023-04-26 NOTE — Patient Instructions (Signed)

## 2023-04-26 NOTE — Progress Notes (Signed)
 Hematology and Oncology Follow Up Visit  Brittney Pace [id number] 1955/03/10 69 y.o. 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  She now is on dialysis.  She is on dialysis in February.  She uses 3 times a week.  Dialysis does tend to tire her out.  She really cannot do much the day that she has her dialysis.  Her blood sugars are still on the high side.  She has an insulin pump.  She still needs to get Aranesp.  Aranesp is only thing that works for her anemia.  Aranesp is done very well for her in our office.  As such, I recommend that she continue Aranesp in our office.  Her last iron studies that were done in January showed a iron saturation of 16%.  She has had a decent appetite.  She has had no nausea or vomiting.  She has had no cough.  There is been no chest wall pain..  She has compression stockings on her legs.  Since dialysis, her blood pressure is doing much better.  Overall, I would say her performance status is probably ECOG 2.   Wt Readings from Last 3 Encounters:  04/26/23 108 lb (49 kg)  04/12/23 108 lb 12.8 oz (49.4 kg)  03/14/23 111 lb (50.3 kg)    Medications:  Allergies as of 04/26/2023       Reactions   Morphine Anaphylaxis, Shortness Of Breath   Penicillins Hives   Tolerated ANCEF on 04/30/20 Has patient had a PCN reaction causing immediate rash, facial/tongue/throat swelling, SOB or lightheadedness with hypotension: Yes Has patient had a PCN reaction causing severe rash involving mucus membranes or skin necrosis: No Has patient had a PCN reaction that required hospitalization No Has patient had a PCN reaction occurring within the last 10 years: No If all of the above answers are "NO", then may proceed with Cephalosporin use.    Tramadol Anaphylaxis   Acetaminophen Other (See Comments)   Alters insulin pump readings   Amitriptyline Other (See Comments)   Severe headache/ out of body feeling   Codeine Other (See Comments)   Severe headaches/ out of body feeling   Ibuprofen Other (See Comments)   Messes up CGM reading on glucose monitor   Krill Oil Diarrhea, Itching, Nausea And Vomiting   Losartan Cough   Propoxyphene Other (See Comments)   Severe headaches / out of body feeling   Shellfish Allergy Diarrhea, Nausea And Vomiting   Statins Other (See Comments)   Muscle pains Other reaction(s): Other   Sulfamethoxazole Other (See Comments)   Mouth ulcers   Sulfites Itching, Other (See Comments)   Mouth ulcers   Fluconazole    Other reaction(s): Contact Dermatitis (intolerance)   Norvasc [amlodipine Besylate] Swelling        Medication List        Accurate as of April 26, 2023  4:09 PM. If you have any questions, ask your nurse or doctor.          Accu-Chek Guide test strip Generic drug: glucose blood 3 (three) times daily.   ALPHA-D-GALACTOSIDASE PO Take 600 mg by mouth daily.   ARANESP IJ Inject as directed.   Armour Thyroid 60 MG tablet Generic drug: thyroid Take 60 mg  by mouth daily before breakfast.   aspirin EC 325 MG tablet Take 1 tablet (325 mg total) by mouth daily. What changed: when to take this   carvedilol 12.5 MG tablet Commonly known as: COREG TAKE 1 TABLET BY MOUTH TWICE DAILY WITH A MEAL   cetirizine 10 MG tablet Commonly known as: ZYRTEC Take 10 mg by mouth at bedtime.   Dexcom G7 Sensor Misc Inject 1 each into the skin as directed. Every 10 days change it.   diphenoxylate-atropine 2.5-0.025 MG tablet Commonly known as: LOMOTIL TAKE 1 TABLET BY MOUTH 4 TIMES DAILY AS NEEDED FOR DIARRHEA OR  LOOSE  STOOLS   esomeprazole 20 MG capsule Commonly known as: NEXIUM Take 20 mg by mouth every morning.   hydrALAZINE 50 MG tablet Commonly known as: APRESOLINE Take  1.5 tablets (75 mg total) by mouth in the morning and at bedtime. What changed:  how much to take when to take this   hydrOXYzine 10 MG tablet Commonly known as: ATARAX Take 1 tablet (10 mg total) by mouth every 6 (six) hours as needed. What changed: reasons to take this   isosorbide mononitrate 60 MG 24 hr tablet Commonly known as: IMDUR Take 60 mg by mouth daily.   ketoconazole 2 % shampoo Commonly known as: NIZORAL Apply 1 Application topically 2 (two) times a week.   ketorolac 0.5 % ophthalmic solution Commonly known as: ACULAR Place 1 drop into both eyes 2 (two) times daily.   lamoTRIgine 150 MG tablet Commonly known as: LAMICTAL Take 150 mg by mouth at bedtime.   MAGNESIUM GLUCONATE PO Take 400 mg by mouth at bedtime.   Melatonin Gummies 2.5 MG Chew Chew 2.5 mg by mouth at bedtime as needed (sleep).   mirtazapine 30 MG tablet Commonly known as: REMERON Take 30 mg by mouth at bedtime.   multivitamin with minerals tablet Take 1 tablet by mouth daily. + Zinc   nitroGLYCERIN 0.4 MG/SPRAY spray Commonly known as: NITROLINGUAL Place 1 spray under the tongue every 5 (five) minutes x 3 doses as needed.   NovoLOG 100 UNIT/ML injection Generic drug: insulin aspart Inject 25 Units into the skin continuous. Insulin pump   OVER THE COUNTER MEDICATION Take 1 capsule by mouth daily. Conjugated linoleic acid   prednisoLONE acetate 1 % ophthalmic suspension Commonly known as: PRED FORTE Place 1 drop into both eyes 2 (two) times daily.   Premarin vaginal cream Generic drug: conjugated estrogens Place 1 applicator vaginally daily as needed (irritation).   torsemide 20 MG tablet Commonly known as: [name] Take 2 tablets (40 mg total) by mouth daily. What changed: when to take this   valACYclovir 500 MG tablet Commonly known as: VALTREX Take 500 mg by mouth daily as needed (Flair up).        Allergies:  Allergies  Allergen Reactions   Morphine Anaphylaxis  and Shortness Of Breath   Penicillins Hives    Tolerated ANCEF on 04/30/20 Has patient had a PCN reaction causing immediate rash, facial/tongue/throat swelling, SOB or lightheadedness with hypotension: Yes Has patient had a PCN reaction causing severe rash involving mucus membranes or skin necrosis: No Has patient had a PCN reaction that required hospitalization No Has patient had a PCN reaction occurring within the last 10 years: No If all of the above answers are "NO", then may proceed with Cephalosporin use.   Tramadol Anaphylaxis   Acetaminophen Other (See Comments)    Alters insulin pump readings    Amitriptyline Other (See Comments)  Severe headache/ out of body feeling   Codeine Other (See Comments)    Severe headaches/ out of body feeling    Ibuprofen Other (See Comments)    Messes up CGM reading on glucose monitor     Krill Oil Diarrhea, Itching and Nausea And Vomiting   Losartan Cough   Propoxyphene Other (See Comments)    Severe headaches / out of body feeling    Shellfish Allergy Diarrhea and Nausea And Vomiting   Statins Other (See Comments)    Muscle pains Other reaction(s): Other   Sulfamethoxazole Other (See Comments)    Mouth ulcers    Sulfites Itching and Other (See Comments)    Mouth ulcers   Fluconazole     Other reaction(s): Contact Dermatitis (intolerance)   Norvasc [Amlodipine Besylate] Swelling    Past Medical History, Surgical history, Social history, and Family History were reviewed and updated.  Review of Systems: Review of Systems  Constitutional:  Positive for malaise/fatigue. Negative for chills and fever.  HENT: Negative.    Eyes:  Positive for blurred vision.  Respiratory:  Positive for shortness of breath (mild).   Cardiovascular:  Negative for leg swelling.  Gastrointestinal:  Negative for abdominal pain and diarrhea.  Genitourinary:  Negative for dysuria, flank pain, frequency, hematuria and urgency.  Musculoskeletal:  Positive for  joint pain and myalgias.  Skin:  Negative for rash.  Neurological:  Positive for headaches.  Endo/Heme/Allergies: Negative.   Psychiatric/Behavioral: Negative.     Wt Readings from Last 3 Encounters:  04/26/23 108 lb (49 kg)  04/12/23 108 lb 12.8 oz (49.4 kg)  03/14/23 111 lb (50.3 kg)   Vitals:   04/26/23 1531  BP: 133/61  Pulse: 65  Resp: 18  Temp: 98.7 F (37.1 C)  SpO2: 100%     Physical Exam Vitals reviewed.  Constitutional:      Comments: Gentle use of cane for ambulation   HENT:     Head: Normocephalic and atraumatic.  Eyes:     Pupils: Pupils are equal, round, and reactive to light.  Cardiovascular:     Rate and Rhythm: Normal rate and regular rhythm.     Heart sounds: Normal heart sounds.  Pulmonary:     Effort: Pulmonary effort is normal.     Breath sounds: Normal breath sounds.  Musculoskeletal:     Cervical back: Normal range of motion.     Right lower leg: No edema.     Left lower leg: No edema.  Lymphadenopathy:     Cervical: No cervical adenopathy.  Skin:    General: Skin is warm and dry.     Findings: No erythema or rash.  Neurological:     Mental Status: She is alert and oriented to person, place, and time. Mental status is at baseline.     Comments: Cranial nerves are intact  Ambulating slowly with cane  Psychiatric:        Behavior: Behavior normal.        Thought Content: Thought content normal.        Judgment: Judgment normal.     Lab Results  Component Value Date   WBC 5.8 04/26/2023   HGB 9.4 (L) 04/26/2023   HCT 29.4 (L) 04/26/2023   MCV 86.5 04/26/2023   PLT 298 04/26/2023   Lab Results  Component Value Date   FERRITIN 28 02/13/2023   IRON 48 02/13/2023   TIBC 295 02/13/2023   UIBC 247 02/13/2023   IRONPCTSAT 16 02/13/2023  Lab Results  Component Value Date   RETICCTPCT 2.5 04/26/2023   RBC 3.40 (L) 04/26/2023   RBC 3.42 (L) 04/26/2023   No results found for: "KPAFRELGTCHN", "LAMBDASER", "KAPLAMBRATIO" No  results found for: "IGGSERUM", "IGA", "IGMSERUM" No results found for: "TOTALPROTELP", "ALBUMINELP", "A1GS", "A2GS", "BETS", "BETA2SER", "GAMS", "MSPIKE", "SPEI"   Chemistry      Component Value Date/Time   NA 135 04/26/2023 1511   NA 138 07/17/2017 1128   K 4.5 04/26/2023 1511   CL 95 (L) 04/26/2023 1511   CO2 32 04/26/2023 1511   BUN 19 04/26/2023 1511   BUN 23 07/17/2017 1128   CREATININE 2.24 (H) 04/26/2023 1511   CREATININE 2.45 (H) 03/03/2022 1129      Component Value Date/Time   CALCIUM 8.3 (L) 04/26/2023 1511   CALCIUM 8.7 02/06/2023 0000   ALKPHOS 91 04/26/2023 1511   AST 18 04/26/2023 1511   ALT 9 04/26/2023 1511   BILITOT 0.4 04/26/2023 1511      Impression and Plan: Brittney Pace is a very pleasant 68 yo female with multifactorial anemia. She has CKD, HTN, DM1.   We will go ahead and give her Aranesp today.  Again this really works well.  We will see what her iron studies look like.  We will have to follow her closely.  Again glad that she is getting dialysis.  She does not have a AV fistula and yet.  I think she is good to meet with the vascular surgeon in a week or so.  I would like to see her back myself in about a month.   Josph Macho, MD 4/2/20254:09 PM

## 2023-04-27 LAB — IRON AND IRON BINDING CAPACITY (CC-WL,HP ONLY)
Iron: 71 ug/dL (ref 28–170)
Saturation Ratios: 29 % (ref 10.4–31.8)
TIBC: 245 ug/dL — ABNORMAL LOW (ref 250–450)
UIBC: 174 ug/dL (ref 148–442)

## 2023-05-01 ENCOUNTER — Other Ambulatory Visit: Payer: Self-pay

## 2023-05-01 ENCOUNTER — Ambulatory Visit
Admission: RE | Admit: 2023-05-01 | Discharge: 2023-05-01 | Disposition: A | Discharge status: Home / Self Care | Source: Ambulatory Visit

## 2023-05-01 ENCOUNTER — Ambulatory Visit: Payer: Self-pay

## 2023-05-01 VITALS — BP 170/83 | HR 62 | Temp 97.7°F | Resp 18

## 2023-05-01 DIAGNOSIS — Z87891 Personal history of nicotine dependence: Secondary | ICD-10-CM | POA: Diagnosis not present

## 2023-05-01 DIAGNOSIS — Z8619 Personal history of other infectious and parasitic diseases: Secondary | ICD-10-CM | POA: Insufficient documentation

## 2023-05-01 DIAGNOSIS — J029 Acute pharyngitis, unspecified: Secondary | ICD-10-CM | POA: Insufficient documentation

## 2023-05-01 LAB — POCT RAPID STREP A (OFFICE): Rapid Strep A Screen: NEGATIVE

## 2023-05-01 LAB — POC COVID19/FLU A&B COMBO
Covid Antigen, POC: NEGATIVE
Influenza A Antigen, POC: NEGATIVE
Influenza B Antigen, POC: NEGATIVE

## 2023-05-01 MED ORDER — AZITHROMYCIN 250 MG PO TABS: ORAL_TABLET | ORAL | 0 refills | Status: AC

## 2023-05-01 NOTE — ED Triage Notes (Signed)
 Pt states on Saturday after dialysis she started having a sore throat and feeling like she had a had a lump towards the bottom of her throat.   Interventions: None

## 2023-05-01 NOTE — ED Provider Notes (Signed)
 [facility]    CSN: [id number] Arrival date & time: 05/01/23  1234    HISTORY   Chief Complaint  Patient presents with   Sore Throat    Throat feels like there's a lump in it.  My throat is not scratchy or sore. The feeling started on Saturday. - Entered by patient   HPI Brittney Pace is a pleasant, 68 y.o. female who presents to urgent care today. Patient states she began to have a very sore throat and pain with swallowing 2 days ago after finishing HD.  Patient denies fever, chills, body aches, rhinorrhea, nasal congestion, cough, headache, nausea, vomiting, diarrhea, known sick contacts.  The history is provided by the patient.   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   Family history of adverse reaction to anesthesia    mother: " while she was under she stopped breathing."   Fibromyalgia    Gastroparesis    GERD (gastroesophageal reflux disease)    Headache    migraines   Hypothyroidism    IBS (irritable bowel syndrome)    Idiopathic edema    Iron deficiency anemia 09/04/2019   Lyme disease    Mitral valve prolapse    Myocardial infarction (HCC)    1 major in 1999 and 2 minor " small vessel disease."   Osteoporosis    Peripheral neuropathy    Peripheral vascular disease (HCC)    Sinus disorder    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   Stroke [facility])    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" " the second stroke was a lacunar    Patient Active Problem List   Diagnosis Date Noted   History of Lyme disease 05/01/2023   Mild protein-calorie malnutrition (HCC) 04/25/2023   Dependence on renal dialysis (HCC) 03/29/2023   End stage renal  disease (HCC) 03/29/2023   Hypertensive heart and chronic kidney disease with heart failure and stage 1 through stage 4 chronic kidney disease, or unspecified chronic kidney disease (HCC) 03/29/2023   Orthostatic hypotension 03/29/2023   Personal history of nicotine dependence 03/29/2023   Secondary hyperparathyroidism of renal origin (HCC) 03/29/2023   Unspecified sequelae of cerebral infarction 03/29/2023   Pain of left hip joint 11/15/2022   Vitamin D deficiency 09/30/2022   History of babesiosis 08/04/2022   Metatarsalgia of right foot 07/27/2022   Pain in right foot 07/14/2022   Acute on chronic diastolic CHF (congestive heart failure) (HCC) 03/27/2022   CKD (chronic kidney disease) stage 4, GFR 15-29 ml/min (HCC) 03/27/2022   Fibromyalgia 03/27/2022   Hypothyroidism 03/27/2022   Transaminitis 03/18/2022   Hyponatremia 03/18/2022   Hyperkalemia 03/18/2022   DKA (diabetic ketoacidosis) (HCC) 03/18/2022   Acute kidney injury superimposed on chronic kidney disease (HCC) 03/18/2022   HTN (hypertension) 03/18/2022   Generalized weakness 03/17/2022   Primary insomnia 09/06/2021   Dementia (HCC) 06/17/2021   Itching 03/26/2021   Physical deconditioning 03/26/2021   Sleep disorder 03/26/2021   Closed fracture of olecranon process of ulna 12/24/2020   Pain in left elbow 12/14/2020   Urinary frequency 10/12/2020  Fracture of humerus 04/24/2020   Chronic migraine without aura, with intractable migraine, so stated, with status migrainosus 04/01/2020   Herpes genitalis 11/07/2019   History of multiple cerebrovascular accidents (CVAs) 10/30/2019   Iron deficiency anemia 09/04/2019   History of hyperbaric oxygen therapy 08/22/2019   DNR (do not resuscitate) 12/10/2018   OAB (overactive bladder) 11/20/2018   Recurrent UTI 11/20/2018   Vaginal atrophy 11/20/2018   Dizziness 10/22/2018   Tinnitus, right 10/22/2018   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   Arthritis 02/23/2018    Asthma 02/23/2018   Carpal tunnel syndrome 02/23/2018   Gastroesophageal reflux disease 02/23/2018   Seasonal affective disorder (HCC) 02/23/2018   Sepsis with metabolic encephalopathy (HCC) 16/10/9602   Hypertension associated with diabetes (HCC) 02/14/2017   Brain atrophy (HCC) 05/24/2016   Chronic headache 05/24/2016   Lacunar stroke (HCC) 01/20/2016   Memory loss 01/20/2016   Depression 01/20/2016   Babesiosis 12/16/2015   Lyme disease 12/16/2015   Small intestinal bacterial overgrowth 11/19/2015   Hyperlipidemia 09/25/2015   Closed displaced intra-articular fracture of right calcaneus 06/16/2015   Myopia of both eyes with astigmatism and presbyopia 05/21/2015   Vitreous syneresis of both eyes 05/21/2015   Combined form of age-related cataract, left eye 05/21/2015   Corneal epithelial basement membrane dystrophy 05/21/2015   Pseudophakia, right eye 05/21/2015   CAD (coronary artery disease) 11/19/2014   Diabetes mellitus type I (HCC) 11/19/2014   DDD (degenerative disc disease), cervical 03/11/2014   Cervical spondylosis with radiculopathy 03/11/2014   Spondylolisthesis of cervical region 03/11/2014   Vitreous hemorrhage of left eye (HCC) 03/03/2014   DDD (degenerative disc disease), thoracic 01/07/2014   Anemia due to stage 3 chronic kidney disease (HCC) 12/16/2013   Osteoporosis 11/14/2013   Cataract due to secondary diabetes (HCC) 08/26/2013   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 associated with type 2 diabetes mellitus (HCC) 08/26/2013   Type 1 diabetes mellitus with hypoglycemia (HCC) 08/26/2013   Hypothyroidism due to acquired atrophy of thyroid 08/02/2013   Acquired hypothyroidism 08/02/2013   Cerebral artery occlusion with cerebral infarction (HCC) 07/14/2013   History of diabetic gastroparesis 04/09/2013   Irritable bowel syndrome with diarrhea 04/09/2013   Diabetic gastroparesis (HCC) 04/09/2013   Anemia of chronic disease  04/08/2013   DKA, type 1 (HCC) 04/08/2013   CKD stage 3 due to type 1 diabetes mellitus (HCC) 04/08/2013   Past Surgical History:  Procedure Laterality Date   ABDOMINAL HYSTERECTOMY     APPENDECTOMY     BREAST SURGERY     B/L biopsy and lumpectomy    CARDIAC CATHETERIZATION     CARPAL TUNNEL RELEASE     CATARACT EXTRACTION W/ INTRAOCULAR LENS IMPLANT     right eye   COLONOSCOPY W/ BIOPSIES AND POLYPECTOMY     CORONARY ARTERY BYPASS GRAFT     coronary artery stents     at LAD and LIMA   DIALYSIS/PERMA CATHETER INSERTION N/A 03/24/2023   Procedure: DIALYSIS/PERMA CATHETER INSERTION;  Surgeon: Dagoberto Ligas, MD;  Location: [facility] INVASIVE CV LAB;  Service: Cardiovascular;  Laterality: N/A;   ECTOPIC PREGNANCY SURGERY     NASAL SEPTUM SURGERY     OPEN REDUCTION INTERNAL FIXATION (ORIF) DISTAL RADIAL FRACTURE Right 05/06/2015   Procedure: OPEN REDUCTION INTERNAL FIXATION (ORIF) RIGHT DISTAL RADIAL FRACTURE AND REPAIRS AS NEEDED;  Surgeon: Bradly Bienenstock, MD;  Location: [facility] OR;  Service: Orthopedics;  Laterality: Right;   ORIF HUMERUS FRACTURE Right 04/30/2020   Procedure: OPEN REDUCTION  INTERNAL FIXATION (ORIF) PROXIMAL HUMERUS FRACTURE;  Surgeon: Francena Hanly, MD;  Location: [facility] ORS;  Service: Orthopedics;  Laterality: Right;    ORIF WRIST FRACTURE Left 11/05/2014   ORIF WRIST FRACTURE Left 11/05/2014   Procedure: OPEN REDUCTION INTERNAL FIXATION (ORIF) LEFT WRIST FRACTURE AND REPAIR AS INDICATED;  Surgeon: Bradly Bienenstock, MD;  Location: [facility] OR;  Service: Orthopedics;  Laterality: Left;   TRIGGER FINGER RELEASE     OB History     Gravida  1   Para      Term      Preterm      AB  1   Living         SAB      IAB      Ectopic  1   Multiple      Live Births             Home Medications    Prior to Admission medications   Medication Sig Start Date End Date Taking? Authorizing Provider  b complex-vitamin c-folic acid (NEPHRO-VITE) 0.8 MG TABS tablet Take by mouth.  04/18/23  Yes [provider]  Doxercalciferol (HECTOROL IV) Doxercalciferol (Hectorol) 04/20/23 04/18/24 Yes [provider]  Methoxy PEG-Epoetin Beta (MIRCERA IJ) Mircera 04/25/23 04/23/24 Yes [provider]  ACCU-CHEK GUIDE test strip 3 (three) times daily. 05/03/21   [provider]  ALPHA-D-GALACTOSIDASE PO Take 600 mg by mouth daily.    [provider]  aspirin EC 325 MG tablet Take 1 tablet (325 mg total) by mouth daily. Patient taking differently: Take 325 mg by mouth at bedtime. 03/06/19   Lewayne Bunting, MD  carvedilol (COREG) 12.5 MG tablet TAKE 1 TABLET BY MOUTH TWICE DAILY WITH A MEAL 04/20/23   Lewayne Bunting, MD  cetirizine (ZYRTEC) 10 MG tablet Take 10 mg by mouth at bedtime. 03/26/21   [provider]  Continuous Glucose Sensor (DEXCOM G7 SENSOR) MISC Inject 1 each into the skin as directed. Every 10 days change it. 11/24/22   [provider]  Darbepoetin Alfa-Albumin (ARANESP IJ) Inject as directed.    [provider]  diphenoxylate-atropine (LOMOTIL) 2.5-0.025 MG tablet TAKE 1 TABLET BY MOUTH 4 TIMES DAILY AS NEEDED FOR DIARRHEA OR  LOOSE  STOOLS 12/24/20   Josph Macho, MD  esomeprazole (NEXIUM) 20 MG capsule Take 20 mg by mouth every morning.     [provider]  hydrALAZINE (APRESOLINE) 50 MG tablet Take 1.5 tablets (75 mg total) by mouth in the morning and at bedtime. Patient taking differently: Take 50 mg by mouth 3 (three) times daily. 02/09/23   Duke, Roe Rutherford, PA  hydrOXYzine (ATARAX) 10 MG tablet Take 1 tablet (10 mg total) by mouth every 6 (six) hours as needed. Patient taking differently: Take 10 mg by mouth every 6 (six) hours as needed for itching. 03/14/23   Josph Macho, MD  isosorbide mononitrate (IMDUR) 60 MG 24 hr tablet Take 60 mg by mouth daily. 06/16/22   [provider]  ketoconazole (NIZORAL) 2 % shampoo Apply 1 Application topically 2 (two) times a week.     [provider]  ketorolac (ACULAR) 0.5 % ophthalmic solution Place 1 drop into both eyes 2 (two) times daily.    [provider]  lamoTRIgine (LAMICTAL) 150 MG tablet Take 150 mg by mouth at bedtime. 01/25/22   [provider]  MAGNESIUM GLUCONATE PO Take 400 mg by mouth at bedtime.    [provider]  Melatonin Gummies 2.5 MG CHEW Chew 2.5 mg by mouth at bedtime as needed (sleep).    [provider]  mirtazapine (REMERON) 30 MG tablet Take 30 mg by mouth at bedtime. 10/15/21   [provider]  Multiple Vitamins-Minerals (MULTIVITAMIN WITH MINERALS) tablet Take 1 tablet by mouth daily. + Zinc    [provider]  nitroGLYCERIN (NITROLINGUAL) 0.4 MG/SPRAY spray Place 1 spray under the tongue every 5 (five) minutes x 3 doses as needed. 02/09/23   Duke, Roe Rutherford, PA  NOVOLOG 100 UNIT/ML injection Inject 25 Units into the skin continuous. Insulin pump 02/21/22   [provider]  OVER THE COUNTER MEDICATION Take 1 capsule by mouth daily. Conjugated linoleic acid    [provider]  prednisoLONE acetate (PRED FORTE) 1 % ophthalmic suspension Place 1 drop into both eyes 2 (two) times daily.    [provider]  PREMARIN vaginal cream Place 1 applicator vaginally daily as needed (irritation). 03/24/21   [provider]  thyroid (ARMOUR THYROID) 60 MG tablet Take 60 mg by mouth daily before breakfast. 07/30/21   [provider]  torsemide ([name]) 20 MG tablet Take 2 tablets (40 mg total) by mouth daily. Patient taking differently: Take 40 mg by mouth 2 (two) times daily. 10/10/22   [name], PA-C  valACYclovir (VALTREX) 500 MG tablet Take 500 mg by mouth daily as needed (Flair up).    [provider]    Family History Family History  Problem Relation Age of Onset   Breast cancer Mother    Migraines Mother    Heart disease Father    Hypertension Father    Breast cancer Sister     Social History Social History   Tobacco Use   Smoking status: Former    Current packs/day: 0.00    Average packs/day: 1 pack/day for 10.0 years (10.0 ttl pk-yrs)    Types: Cigarettes    Start date: 26    Quit date: 37    Years since quitting: 40.2   Smokeless tobacco: Never   Tobacco comments:     " Quit smoking cigarettes in 20's "  Vaping Use   Vaping status: Never Used  Substance Use Topics   Alcohol use: Yes    Comment: beer nightly   Drug use: No   Allergies   Morphine, Penicillins, Tramadol, Acetaminophen, Amitriptyline, Codeine, Ibuprofen, Krill oil, Lactose intolerance (gi), Losartan, Propoxyphene, Shellfish allergy, Statins, Sulfamethoxazole, Sulfites, Ace inhibitors, Fluconazole, and Norvasc [amlodipine besylate]  Review of Systems Review of Systems Pertinent findings revealed after performing a 14 point review of systems has been noted in the history of present illness.  Physical Exam Vital Signs BP (!) 170/83 (BP Location: Left Arm)   Pulse 62   Temp 97.7 F (36.5 C) (Oral)   Resp 18   SpO2 96%   No data found.  Physical Exam Vitals and nursing note reviewed.  Constitutional:      General: She is not in acute distress.    Appearance: Normal appearance. She is not ill-appearing.  HENT:     Head: Normocephalic and atraumatic.     Salivary Glands: Right salivary gland is not diffusely enlarged or tender. Left salivary gland is not diffusely enlarged or tender.     Right Ear: Hearing, tympanic membrane, ear canal and external ear normal. No drainage. No middle ear effusion. There is no impacted cerumen. A PE tube is present. Tympanic membrane is not erythematous or bulging.  Left Ear: Hearing, tympanic membrane, ear canal and external ear normal. No drainage.  No middle ear effusion. There is no impacted cerumen. A PE tube is present. Tympanic membrane is not erythematous or bulging.     Nose: Nose normal. No nasal deformity, septal deviation,  mucosal edema, congestion or rhinorrhea.     Right Turbinates: Not enlarged, swollen or pale.     Left Turbinates: Not enlarged, swollen or pale.     Right Sinus: No maxillary sinus tenderness or frontal sinus tenderness.     Left Sinus: No maxillary sinus tenderness or frontal sinus tenderness.     Mouth/Throat:     Lips: Pink. No lesions.     Mouth: Mucous membranes are moist. No oral lesions.     Pharynx: Uvula midline. Pharyngeal swelling, posterior oropharyngeal erythema, uvula swelling and postnasal drip present. No oropharyngeal exudate.     Tonsils: No tonsillar exudate. 0 on the right. 0 on the left.  Eyes:     General: Lids are normal.        Right eye: No discharge.        Left eye: No discharge.     Extraocular Movements: Extraocular movements intact.     Conjunctiva/sclera: Conjunctivae normal.     Right eye: Right conjunctiva is not injected.     Left eye: Left conjunctiva is not injected.  Neck:     Trachea: Trachea and phonation normal.  Cardiovascular:     Rate and Rhythm: Normal rate and regular rhythm.     Pulses: Normal pulses.     Heart sounds: Normal heart sounds. No murmur heard.    No friction rub. No gallop.  Pulmonary:     Effort: Pulmonary effort is normal. No accessory muscle usage, prolonged expiration or respiratory distress.     Breath sounds: Normal breath sounds. No stridor, decreased air movement or transmitted upper airway sounds. No decreased breath sounds, wheezing, rhonchi or rales.  Chest:     Chest wall: No tenderness.  Musculoskeletal:        General: Normal range of motion.     Cervical back: Full passive range of motion without pain, normal range of motion and neck supple. Normal range of motion.  Lymphadenopathy:     Cervical: No cervical adenopathy.     Right cervical: No superficial, deep or posterior cervical adenopathy.    Left cervical: No superficial, deep or posterior cervical adenopathy.  Skin:    General: Skin is warm and dry.      Findings: No erythema or rash.  Neurological:     General: No focal deficit present.     Mental Status: She is alert and oriented to person, place, and time.  Psychiatric:        Mood and Affect: Mood normal.        Behavior: Behavior normal.     Visual Acuity Right Eye Distance:   Left Eye Distance:   Bilateral Distance:    Right Eye Near:   Left Eye Near:    Bilateral Near:     UC Couse / Diagnostics / Procedures:     Radiology No results found.  Procedures Procedures (including critical care time) EKG  Pending results:  Labs Reviewed  POCT RAPID STREP A (OFFICE) - Normal  CULTURE, GROUP A STREP ([facility])  POC COVID19/FLU A&B COMBO    Medications Ordered in UC: Medications - No data to display  UC Diagnoses / Final Clinical Impressions(s)   I have reviewed the  triage vital signs and the nursing notes.  Pertinent labs & imaging results that were available during my care of the patient were reviewed by me and considered in my medical decision making (see chart for details).    Final diagnoses:  Acute pharyngitis, unspecified etiology   Influenza and COVID-19 testing were negative.  RST is negative, throat culture pending. Based on patient being in HD patient and physical exam findings, will treat patient empirically for presumed bacterial throat infection with azithromycin.  Will notify patient of culture results once received and make adjustments as needed.  Supportive medications discussed.  Conservative care recommended.  Return precautions advised.  Please see discharge instructions below for details of plan of care as provided to patient. ED Prescriptions     Medication Sig Dispense Auth. Provider   azithromycin (ZITHROMAX) 250 MG tablet Take 2 tablets (500 mg total) by mouth daily for 1 day, THEN 1 tablet (250 mg total) daily for 4 days. 6 tablet Theadora Rama Scales, PA-C      PDMP not reviewed this encounter.  Pending results:  Labs  Reviewed  POCT RAPID STREP A (OFFICE) - Normal  CULTURE, GROUP A STREP [facility])  POC COVID19/FLU A&B COMBO      Discharge Instructions      Your rapid influenza and COVID-19 antigen tests today were negative.  No further viral testing is indicated.     Your strep test today is negative.  Streptococcal throat culture will be performed per our protocol, please keep in mind that the rapid strep test that we perform here at urgent care only catches 40% of strep throat infections. Based on my physical exam findings and the history you provided to me today, I recommend that you begin antibiotics now for presumed strep throat instead of waiting for the strep culture result.  I have sent a prescription to your pharmacy.  After 24 hours of antibiotics, you should begin to feel significantly better.     If your streptococcal throat culture has a negative result but you feel significantly better after taking antibiotics for 24 to 48 hours, I strongly recommend that you finish the full 10-day course.  Bacterial culture tests are only as reliable as the laboratory technician performing them.     Alternately, if your streptococcal throat culture has a negative result and you see no improvement of your symptoms after 24 to 48 hours of antibiotics, please discontinue the antibiotics as they are no longer indicated.  Your throat infection will then be most likely considered viral and will have to resolve on its own.   After 24 hours, please discard your toothbrush as well as any other oral devices that you are currently using and replace them with new ones to avoid reinfection.  Also after 24 hours, you will no longer be contagious.    Please read below to learn more about the medications, dosages and frequencies that I recommend to help alleviate your symptoms and to get you feeling better soon:   Z-Pak (azithromycin):  Please take two (2) tablets on day one and one tablet daily thereafter until the  prescription is complete.This antibiotic can cause upset stomach, this will resolve once antibiotics are complete.  You are welcome to take a probiotic, eat yogurt, take Imodium while taking this medication.  Please avoid other systemic medications such as Maalox, Pepto-Bismol or milk of magnesia as they can interfere with the body's ability to absorb the antibiotics.   Chloraseptic Throat Spray: Spray 5 sprays  into affected area every 2 hours, hold for 15 seconds and either swallow or spit it out.  This is a excellent numbing medication.  Because it is a spray, you can apply it where your throat hurts and avoid numbing your entire mouth as a sore throat lozenge will.     If symptoms have not meaningfully improved in the next 3 to 5 days, please return for repeat evaluation or follow-up with your regular provider.  If symptoms have worsened in the next 3 to 5 days, please go to the emergency room for further evaluation.    Thank you for visiting urgent care today.  We appreciate the opportunity to participate in your care.       Disposition Upon Discharge:  Condition: stable for discharge home  Patient presented with an acute illness with associated systemic symptoms and significant discomfort requiring urgent management. In my opinion, this is a condition that a prudent lay person (someone who possesses an average knowledge of health and medicine) may potentially expect to result in complications if not addressed urgently such as respiratory distress, impairment of bodily function or dysfunction of bodily organs.   Routine symptom specific, illness specific and/or disease specific instructions were discussed with the patient and/or caregiver at length.   As such, the patient has been evaluated and assessed, work-up was performed and treatment was provided in alignment with urgent care protocols and evidence based medicine.  Patient/parent/caregiver has been advised that the patient may require  follow up for further testing and treatment if the symptoms continue in spite of treatment, as clinically indicated and appropriate.  Patient/parent/caregiver has been advised to return to the [facility] or PCP if no better; to PCP or the Emergency Department if new signs and symptoms develop, or if the current signs or symptoms continue to change or worsen for further workup, evaluation and treatment as clinically indicated and appropriate  The patient will follow up with their current PCP if and as advised. If the patient does not currently have a PCP we will assist them in obtaining one.   The patient may need specialty follow up if the symptoms continue, in spite of conservative treatment and management, for further workup, evaluation, consultation and treatment as clinically indicated and appropriate.  Patient/parent/caregiver verbalized understanding and agreement of plan as discussed.  All questions were addressed during visit.  Please see discharge instructions below for further details of plan.  This office note has been dictated using Teaching laboratory technician.  Unfortunately, this method of dictation can sometimes lead to typographical or grammatical errors.  I apologize for your inconvenience in advance if this occurs.  Please do not hesitate to reach out to me if clarification is needed.      Theadora Rama Scales, PA-C 05/01/23 1342

## 2023-05-01 NOTE — Discharge Instructions (Addendum)
 Your rapid influenza and COVID-19 antigen tests today were negative.  No further viral testing is indicated.     Your strep test today is negative.  Streptococcal throat culture will be performed per our protocol, please keep in mind that the rapid strep test that we perform here at urgent care only catches 40% of strep throat infections. Based on my physical exam findings and the history you provided to me today, I recommend that you begin antibiotics now for presumed strep throat instead of waiting for the strep culture result.  I have sent a prescription to your pharmacy.  After 24 hours of antibiotics, you should begin to feel significantly better.     If your streptococcal throat culture has a negative result but you feel significantly better after taking antibiotics for 24 to 48 hours, I strongly recommend that you finish the full 10-day course.  Bacterial culture tests are only as reliable as the laboratory technician performing them.     Alternately, if your streptococcal throat culture has a negative result and you see no improvement of your symptoms after 24 to 48 hours of antibiotics, please discontinue the antibiotics as they are no longer indicated.  Your throat infection will then be most likely considered viral and will have to resolve on its own.   After 24 hours, please discard your toothbrush as well as any other oral devices that you are currently using and replace them with new ones to avoid reinfection.  Also after 24 hours, you will no longer be contagious.    Please read below to learn more about the medications, dosages and frequencies that I recommend to help alleviate your symptoms and to get you feeling better soon:   Z-Pak (azithromycin):  Please take two (2) tablets on day one and one tablet daily thereafter until the prescription is complete.This antibiotic can cause upset stomach, this will resolve once antibiotics are complete.  You are welcome to take a probiotic, eat  yogurt, take Imodium while taking this medication.  Please avoid other systemic medications such as Maalox, Pepto-Bismol or milk of magnesia as they can interfere with the body's ability to absorb the antibiotics.   Chloraseptic Throat Spray: Spray 5 sprays into affected area every 2 hours, hold for 15 seconds and either swallow or spit it out.  This is a excellent numbing medication.  Because it is a spray, you can apply it where your throat hurts and avoid numbing your entire mouth as a sore throat lozenge will.     If symptoms have not meaningfully improved in the next 3 to 5 days, please return for repeat evaluation or follow-up with your regular provider.  If symptoms have worsened in the next 3 to 5 days, please go to the emergency room for further evaluation.    Thank you for visiting urgent care today.  We appreciate the opportunity to participate in your care.

## 2023-05-02 ENCOUNTER — Ambulatory Visit (INDEPENDENT_AMBULATORY_CARE_PROVIDER_SITE_OTHER): Admitting: Vascular Surgery

## 2023-05-02 ENCOUNTER — Encounter: Payer: Self-pay | Admitting: Vascular Surgery

## 2023-05-02 VITALS — BP 153/68 | HR 60 | Temp 97.6°F | Ht 59.0 in | Wt 111.0 lb

## 2023-05-02 DIAGNOSIS — N186 End stage renal disease: Secondary | ICD-10-CM | POA: Diagnosis not present

## 2023-05-02 DIAGNOSIS — Z992 Dependence on renal dialysis: Secondary | ICD-10-CM

## 2023-05-02 NOTE — Progress Notes (Signed)
 [facility] [place]  ASSESSMENT / PLAN: Brittney Pace is a 68 y.o. right handed female in need of permanent dialysis access. I reviewed options for dialysis in detail with the patient, including hemodialysis and peritoneal dialysis. I counseled the patient that dialysis access requires surveillance and periodic maintenance. Plan to proceed with laparoscopic peritoneal dialysis catheter once patient has been cleared for this modality with Nephrology.   CHIEF COMPLAINT: renal failure  HISTORY OF PRESENT ILLNESS: Brittney Pace is a 68 y.o. female with ESRD on HD via [facility] TTS. Patient presents to care for evaluation of PD catheter. The patient desires home based dialysis. She has had laparoscopic abdominal surgery only. She is right handed. We reviewed the technical details of lap PD catheter placement.  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   Family history of adverse reaction to anesthesia    mother: " while she was under she stopped breathing."   Fibromyalgia    Gastroparesis    GERD (gastroesophageal reflux disease)    Headache    migraines   Hypothyroidism    IBS (irritable bowel syndrome)    Idiopathic edema    Iron deficiency anemia 09/04/2019   Lyme disease    Mitral valve prolapse    Myocardial infarction (HCC)    1 major in 1999 and 2 minor " small vessel disease."   Osteoporosis    Peripheral neuropathy    Peripheral vascular disease (HCC)    Sinus disorder    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   Stroke (HCC)    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" " the second stroke was a lacunar     Past Surgical History:   Procedure Laterality Date   ABDOMINAL HYSTERECTOMY     APPENDECTOMY     BREAST SURGERY     B/L biopsy and lumpectomy    CARDIAC CATHETERIZATION     CARPAL TUNNEL RELEASE     CATARACT EXTRACTION W/ INTRAOCULAR LENS IMPLANT     right eye   COLONOSCOPY W/ BIOPSIES AND POLYPECTOMY     CORONARY ARTERY BYPASS GRAFT     coronary artery stents     at LAD and LIMA   DIALYSIS/PERMA CATHETER INSERTION N/A 03/24/2023   Procedure: DIALYSIS/PERMA CATHETER INSERTION;  Surgeon: Dagoberto Ligas, MD;  Location: [facility] INVASIVE CV LAB;  Service: Cardiovascular;  Laterality: N/A;   ECTOPIC PREGNANCY SURGERY     NASAL SEPTUM SURGERY     OPEN REDUCTION INTERNAL FIXATION (ORIF) DISTAL RADIAL FRACTURE Right 05/06/2015   Procedure: OPEN REDUCTION INTERNAL FIXATION (ORIF) RIGHT DISTAL RADIAL FRACTURE AND REPAIRS AS NEEDED;  Surgeon: Bradly Bienenstock, MD;  Location: [facility] OR;  Service: Orthopedics;  Laterality: Right;   ORIF HUMERUS FRACTURE Right 04/30/2020   Procedure: OPEN REDUCTION INTERNAL FIXATION (ORIF) PROXIMAL HUMERUS FRACTURE;  Surgeon: Francena Hanly, MD;  Location: [facility] ORS;  Service: Orthopedics;  Laterality: Right;    ORIF WRIST FRACTURE Left 11/05/2014   ORIF WRIST FRACTURE Left 11/05/2014   Procedure: OPEN REDUCTION INTERNAL FIXATION (ORIF) LEFT WRIST FRACTURE AND REPAIR AS INDICATED;  Surgeon: Bradly Bienenstock, MD;  Location: [facility] OR;  Service: Orthopedics;  Laterality: Left;   TRIGGER FINGER RELEASE      Family History  Problem Relation Age of Onset   Breast cancer Mother    Migraines Mother    Heart disease Father    Hypertension Father    Breast cancer Sister     Social History   Socioeconomic History   Marital status: Married    Spouse name: Not on file   Number of children: 0   Years of education: college   Highest education level: Not on file  Occupational History   Occupation: Retired  Tobacco Use   Smoking status: Former    Current packs/day: 0.00    Average packs/day: 1 pack/day for  10.0 years (10.0 ttl pk-yrs)    Types: Cigarettes    Start date: 61    Quit date: 1985    Years since quitting: 40.2   Smokeless tobacco: Never   Tobacco comments:     " Quit smoking cigarettes in 20's "  Vaping Use   Vaping status: Never Used  Substance and Sexual Activity   Alcohol use: Yes    Comment: beer nightly   Drug use: No   Sexual activity: Not Currently  Other Topics Concern   Not on file  Social History Narrative   Drinks 1-2  cups caffeine drinks a day    Right handed   Lives at home with husband and dog   Social Drivers of Corporate investment banker Strain: Not on file  Food Insecurity: Low Risk  (09/13/2022)   Received from [facility]   Hunger Vital Sign    Worried About Running Out of Food in the Last Year: Never true    Ran Out of Food in the Last Year: Never true  Transportation Needs: Not on file (09/13/2022)  Physical Activity: Not on file  Stress: Not on file  Social Connections: Unknown (07/08/2022)   Received from [facility], [facility]   Social Network    Social Network: Not on file  Intimate Partner Violence: Unknown (07/08/2022)   Received from [facility], [facility]   HITS    Physically Hurt: Not on file    Insult or Talk Down To: Not on file    Threaten Physical Harm: Not on file    Scream or Curse: Not on file    Allergies  Allergen Reactions   Morphine Anaphylaxis and Shortness Of Breath   Penicillins Hives    Tolerated ANCEF on 04/30/20 Has patient had a PCN reaction causing immediate rash, facial/tongue/throat swelling, SOB or lightheadedness with hypotension: Yes Has patient had a PCN reaction causing severe rash involving mucus membranes or skin necrosis: No Has patient had a PCN reaction that required hospitalization No Has patient had a PCN reaction occurring within the last 10 years: No If all of the above answers are "NO", then may proceed with Cephalosporin use.   Tramadol Anaphylaxis   Acetaminophen Other  (See Comments)    Alters insulin pump readings    Amitriptyline Other (See Comments)    Severe headache/ out of body feeling   Codeine Other (See Comments)    Severe headaches/ out of body feeling    Ibuprofen Other (See Comments)    Messes up CGM reading on glucose monitor     Krill Oil Diarrhea, Itching and Nausea And Vomiting   Lactose Intolerance (Gi) Diarrhea   Losartan Cough   Propoxyphene Other (See  Comments)    Severe headaches / out of body feeling    Shellfish Allergy Diarrhea and Nausea And Vomiting   Statins Other (See Comments)    Muscle pains Other reaction(s): Other   Sulfamethoxazole Other (See Comments)    Mouth ulcers    Sulfites Itching and Other (See Comments)    Mouth ulcers   Ace Inhibitors     Other Reaction(s): Unknown   Fluconazole     Other reaction(s): Contact Dermatitis (intolerance)   Norvasc [Amlodipine Besylate] Swelling    Current Outpatient Medications  Medication Sig Dispense Refill   ACCU-CHEK GUIDE test strip 3 (three) times daily.     ALPHA-D-GALACTOSIDASE PO Take 600 mg by mouth daily.     aspirin EC 325 MG tablet Take 1 tablet (325 mg total) by mouth daily. (Patient taking differently: Take 325 mg by mouth at bedtime.) 30 tablet 0   azithromycin (ZITHROMAX) 250 MG tablet Take 2 tablets (500 mg total) by mouth daily for 1 day, THEN 1 tablet (250 mg total) daily for 4 days. 6 tablet 0   b complex-vitamin c-folic acid (NEPHRO-VITE) 0.8 MG TABS tablet Take by mouth.     carvedilol (COREG) 12.5 MG tablet TAKE 1 TABLET BY MOUTH TWICE DAILY WITH A MEAL 180 tablet 3   cetirizine (ZYRTEC) 10 MG tablet Take 10 mg by mouth at bedtime.     Continuous Glucose Sensor (DEXCOM G7 SENSOR) MISC Inject 1 each into the skin as directed. Every 10 days change it.     Darbepoetin Alfa-Albumin (ARANESP IJ) Inject as directed.     diphenoxylate-atropine (LOMOTIL) 2.5-0.025 MG tablet TAKE 1 TABLET BY MOUTH 4 TIMES DAILY AS NEEDED FOR DIARRHEA OR  LOOSE   STOOLS 45 tablet 0   Doxercalciferol (HECTOROL IV) Doxercalciferol (Hectorol)     esomeprazole (NEXIUM) 20 MG capsule Take 20 mg by mouth every morning.      hydrALAZINE (APRESOLINE) 50 MG tablet Take 1.5 tablets (75 mg total) by mouth in the morning and at bedtime. (Patient taking differently: Take 50 mg by mouth 3 (three) times daily.) 90 tablet 3   hydrOXYzine (ATARAX) 10 MG tablet Take 1 tablet (10 mg total) by mouth every 6 (six) hours as needed. (Patient taking differently: Take 10 mg by mouth every 6 (six) hours as needed for itching.) 90 tablet 3   isosorbide mononitrate (IMDUR) 60 MG 24 hr tablet Take 60 mg by mouth daily.     ketoconazole (NIZORAL) 2 % shampoo Apply 1 Application topically 2 (two) times a week.     ketorolac (ACULAR) 0.5 % ophthalmic solution Place 1 drop into both eyes 2 (two) times daily.     lamoTRIgine (LAMICTAL) 150 MG tablet Take 150 mg by mouth at bedtime.     MAGNESIUM GLUCONATE PO Take 400 mg by mouth at bedtime.     Melatonin Gummies 2.5 MG CHEW Chew 2.5 mg by mouth at bedtime as needed (sleep).     Methoxy PEG-Epoetin Beta (MIRCERA IJ) Mircera     mirtazapine (REMERON) 30 MG tablet Take 30 mg by mouth at bedtime.     Multiple Vitamins-Minerals (MULTIVITAMIN WITH MINERALS) tablet Take 1 tablet by mouth daily. + Zinc     nitroGLYCERIN (NITROLINGUAL) 0.4 MG/SPRAY spray Place 1 spray under the tongue every 5 (five) minutes x 3 doses as needed. 12 g 3   NOVOLOG 100 UNIT/ML injection Inject 25 Units into the skin continuous. Insulin pump     OVER THE COUNTER MEDICATION Take  1 capsule by mouth daily. Conjugated linoleic acid     prednisoLONE acetate (PRED FORTE) 1 % ophthalmic suspension Place 1 drop into both eyes 2 (two) times daily.     PREMARIN vaginal cream Place 1 applicator vaginally daily as needed (irritation).     thyroid (ARMOUR THYROID) 60 MG tablet Take 60 mg by mouth daily before breakfast.     torsemide ([name]) 20 MG tablet Take 2 tablets (40 mg  total) by mouth daily. (Patient taking differently: Take 40 mg by mouth 2 (two) times daily.)     valACYclovir (VALTREX) 500 MG tablet Take 500 mg by mouth daily as needed (Flair up).     No current facility-administered medications for this visit.   Facility-Administered Medications Ordered in Other Visits  Medication Dose Route Frequency Provider Last Rate Last Admin   Darbepoetin Alfa (ARANESP) injection 300 mcg  300 mcg Subcutaneous Once Josph Macho, MD        PHYSICAL EXAM Vitals:   05/02/23 0852  BP: (!) 153/68  Pulse: 60  Temp: 97.6 F (36.4 C)  SpO2: 98%  Weight: 111 lb (50.3 kg)  Height: 4\' 11"  (1.499 m)   NO distress Regular rate and rhythm Unlabored breathing Abdomen soft without significant scarring.  PERTINENT LABORATORY AND RADIOLOGIC DATA  Most recent CBC    Latest Ref Rng & Units 04/26/2023    3:11 PM 03/14/2023   11:40 AM 02/13/2023   11:53 AM  CBC  WBC 4.0 - 10.5 K/uL 5.8  7.1  5.2   Hemoglobin 12.0 - 15.0 g/dL 9.4  16.1  09.6   Hematocrit 36.0 - 46.0 % 29.4  35.4  33.2   Platelets 150 - 400 K/uL 298  330  331      Most recent CMP    Latest Ref Rng & Units 04/26/2023    3:11 PM 03/14/2023   11:40 AM 02/13/2023   11:53 AM  CMP  Glucose 70 - 99 mg/dL 045  409  811   BUN 8 - 23 mg/dL 19  52  41   Creatinine 0.44 - 1.00 mg/dL 9.14  7.82  9.56   Sodium 135 - 145 mmol/L 135  134  133   Potassium 3.5 - 5.1 mmol/L 4.5  5.3  4.3   Chloride 98 - 111 mmol/L 95  99  97   CO2 22 - 32 mmol/L 32  27  29   Calcium 8.9 - 10.3 mg/dL 8.3  8.9  8.4   Total Protein 6.5 - 8.1 g/dL 5.6  5.5  5.4   Total Bilirubin 0.0 - 1.2 mg/dL 0.4  0.3  0.3   Alkaline Phos 38 - 126 U/L 91  86  83   AST 15 - 41 U/L 18  18  16    ALT 0 - 44 U/L 9  11  8      Renal function Estimated Creatinine Clearance: 16.4 mL/min (A) (by C-G formula based on SCr of 2.24 mg/dL (H)).  Hemoglobin A1C (no units)  Date Value  05/20/2019 7.8%   Hgb A1c MFr Bld (%)  Date Value  12/24/2020 9.6  (H)    LDL Cholesterol (Calc)  Date Value Ref Range Status  07/20/2018 116 (H) mg/dL (calc) Final    Comment:    Reference range: <100 . Desirable range <100 mg/dL for primary prevention;   <70 mg/dL for patients with CHD or diabetic patients  with > or = 2 CHD risk factors. Marland Kitchen LDL-C is now  calculated using the Martin-Hopkins  calculation, which is a validated novel method providing  better accuracy than the Friedewald equation in the  estimation of LDL-C.  Horald Pollen et al. Lenox Ahr. 1610;960(45): 2061-2068  (http://education.[employer].com/faq/FAQ164)      Rande Brunt. Lenell Antu, MD FACS [facility] Phone Number: [phone] 05/02/2023 11:55 AM   Total time spent on preparing this encounter including chart review, data review, collecting history, examining the patient, coordinating care for this new patient, 60 minutes.  Portions of this report may have been transcribed using voice recognition software.  Every effort has been made to ensure accuracy; however, inadvertent computerized transcription errors may still be present.

## 2023-05-04 LAB — CULTURE, GROUP A STREP (THRC)

## 2023-05-05 ENCOUNTER — Encounter: Payer: Self-pay | Admitting: Emergency Medicine

## 2023-05-20 ENCOUNTER — Emergency Department (HOSPITAL_COMMUNITY)
Admission: EM | Admit: 2023-05-20 | Discharge: 2023-05-20 | Disposition: A | Discharge status: Home / Self Care | Attending: Emergency Medicine | Admitting: Emergency Medicine

## 2023-05-20 ENCOUNTER — Other Ambulatory Visit: Payer: Self-pay

## 2023-05-20 ENCOUNTER — Encounter (HOSPITAL_COMMUNITY): Payer: Self-pay | Admitting: Pharmacy Technician

## 2023-05-20 DIAGNOSIS — N186 End stage renal disease: Secondary | ICD-10-CM | POA: Insufficient documentation

## 2023-05-20 DIAGNOSIS — Z794 Long term (current) use of insulin: Secondary | ICD-10-CM | POA: Insufficient documentation

## 2023-05-20 DIAGNOSIS — Z992 Dependence on renal dialysis: Secondary | ICD-10-CM | POA: Insufficient documentation

## 2023-05-20 DIAGNOSIS — E1022 Type 1 diabetes mellitus with diabetic chronic kidney disease: Secondary | ICD-10-CM | POA: Insufficient documentation

## 2023-05-20 DIAGNOSIS — Z7982 Long term (current) use of aspirin: Secondary | ICD-10-CM | POA: Diagnosis not present

## 2023-05-20 DIAGNOSIS — T8249XA Other complication of vascular dialysis catheter, initial encounter: Secondary | ICD-10-CM | POA: Insufficient documentation

## 2023-05-20 DIAGNOSIS — T829XXA Unspecified complication of cardiac and vascular prosthetic device, implant and graft, initial encounter: Secondary | ICD-10-CM

## 2023-05-20 LAB — CBC WITH DIFFERENTIAL/PLATELET
Abs Immature Granulocytes: 0.01 10*3/uL (ref 0.00–0.07)
Basophils Absolute: 0.1 10*3/uL (ref 0.0–0.1)
Basophils Relative: 2 %
Eosinophils Absolute: 0.6 10*3/uL — ABNORMAL HIGH (ref 0.0–0.5)
Eosinophils Relative: 10 %
HCT: 38.8 % (ref 36.0–46.0)
Hemoglobin: 12.1 g/dL (ref 12.0–15.0)
Immature Granulocytes: 0 %
Lymphocytes Relative: 20 %
Lymphs Abs: 1.2 10*3/uL (ref 0.7–4.0)
MCH: 29.7 pg (ref 26.0–34.0)
MCHC: 31.2 g/dL (ref 30.0–36.0)
MCV: 95.3 fL (ref 80.0–100.0)
Monocytes Absolute: 0.6 10*3/uL (ref 0.1–1.0)
Monocytes Relative: 9 %
Neutro Abs: 3.5 10*3/uL (ref 1.7–7.7)
Neutrophils Relative %: 59 %
Platelets: 297 10*3/uL (ref 150–400)
RBC: 4.07 MIL/uL (ref 3.87–5.11)
RDW: 18.2 % — ABNORMAL HIGH (ref 11.5–15.5)
WBC: 6 10*3/uL (ref 4.0–10.5)
nRBC: 0 % (ref 0.0–0.2)

## 2023-05-20 LAB — BASIC METABOLIC PANEL WITH GFR
Anion gap: 15 (ref 5–15)
BUN: 45 mg/dL — ABNORMAL HIGH (ref 8–23)
CO2: 22 mmol/L (ref 22–32)
Calcium: 8.9 mg/dL (ref 8.9–10.3)
Chloride: 102 mmol/L (ref 98–111)
Creatinine, Ser: 3.43 mg/dL — ABNORMAL HIGH (ref 0.44–1.00)
GFR, Estimated: 14 mL/min — ABNORMAL LOW (ref >60)
Glucose, Bld: 99 mg/dL (ref 70–99)
Potassium: 5.1 mmol/L (ref 3.5–5.1)
Sodium: 139 mmol/L (ref 135–145)

## 2023-05-20 NOTE — ED Triage Notes (Signed)
 Pt here POV with reports of being told at dialysis that her catheter is broken. Last treatment Tuesday.

## 2023-05-20 NOTE — Discharge Instructions (Addendum)
 It was a pleasure taking care of you today.  You are seen because your dialysis catheter is broken.  I spoke with the interventional radiologist Dr. Darylene Epley.  They will be able to do procedure on Monday.  I spoke with one of the OR nurses who is going to left the OR note to schedule it, she said you will get a phone call letting you know what time to come in.  Do not eat or drink anything after midnight on Sunday night.  If you do not hear back from them by Sunday night, first thing Monday morning call your dialysis center and tell them you need your catheter replaced, your dialysis doctor said that they may be able to set up getting it replaced this way.  If you have shortness of breath, or any other new or worsening symptoms before then come back to the ER.

## 2023-05-20 NOTE — ED Provider Notes (Cosign Needed Addendum)
 [place] AT [facility] Provider Note   CSN: [id number] Arrival date & time: 05/20/23  1304     History  Chief Complaint  Patient presents with   Vascular Access Problem    Brittney Pace is a 68 y.o. female.  He has PMH of type 1 diabetes and ESRD on dialysis Tuesday Thursday Saturday.  Last dialysis was Tuesday, April 22, she missed the Thursday session due to feeling unwell.  Presents to the ER today for broken dialysis catheter.  She has a right sided tunneled dialysis catheter.  She went to dialysis today and then noted that the catheter was broken so she was sent to the ER for further evaluation.  She denies any complaints other than pain in her ankles due to her chronic arthritis.  HPI     Home Medications Prior to Admission medications   Medication Sig Start Date End Date Taking? Authorizing Provider  ACCU-CHEK GUIDE test strip 3 (three) times daily. 05/03/21   [provider]  ALPHA-D-GALACTOSIDASE PO Take 600 mg by mouth daily.    [provider]  aspirin  EC 325 MG tablet Take 1 tablet (325 mg total) by mouth daily. Patient taking differently: Take 325 mg by mouth at bedtime. 03/06/19   Lenise Quince, MD  b complex-vitamin c -folic acid  (NEPHRO-VITE) 0.8 MG TABS tablet Take by mouth. 04/18/23   [provider]  carvedilol  (COREG ) 12.5 MG tablet TAKE 1 TABLET BY MOUTH TWICE DAILY WITH A MEAL 04/20/23   Lenise Quince, MD  cetirizine (ZYRTEC) 10 MG tablet Take 10 mg by mouth at bedtime. 03/26/21   [provider]  Continuous Glucose Sensor (DEXCOM G7 SENSOR) MISC Inject 1 each into the skin as directed. Every 10 days change it. 11/24/22   [provider]  Darbepoetin Alfa -Albumin (ARANESP  IJ) Inject as directed.    [provider]  diphenoxylate -atropine  (LOMOTIL ) 2.5-0.025 MG tablet TAKE 1 TABLET BY MOUTH 4 TIMES DAILY AS NEEDED FOR DIARRHEA OR  LOOSE  STOOLS 12/24/20   Ivor Mars,  MD  Doxercalciferol (HECTOROL IV) Doxercalciferol (Hectorol) 04/20/23 04/18/24  [provider]  esomeprazole (NEXIUM) 20 MG capsule Take 20 mg by mouth every morning.     [provider]  hydrALAZINE  (APRESOLINE ) 50 MG tablet Take 1.5 tablets (75 mg total) by mouth in the morning and at bedtime. Patient taking differently: Take 50 mg by mouth 3 (three) times daily. 02/09/23   [facility], Warren Haber, PA  hydrOXYzine  (ATARAX ) 10 MG tablet Take 1 tablet (10 mg total) by mouth every 6 (six) hours as needed. Patient taking differently: Take 10 mg by mouth every 6 (six) hours as needed for itching. 03/14/23   Ivor Mars, MD  isosorbide  mononitrate (IMDUR ) 60 MG 24 hr tablet Take 60 mg by mouth daily. 06/16/22   [provider]  ketoconazole  (NIZORAL ) 2 % shampoo Apply 1 Application topically 2 (two) times a week.    [provider]  ketorolac  (ACULAR ) 0.5 % ophthalmic solution Place 1 drop into both eyes 2 (two) times daily.    [provider]  lamoTRIgine  (LAMICTAL ) 150 MG tablet Take 150 mg by mouth at bedtime. 01/25/22   [provider]  MAGNESIUM  GLUCONATE PO Take 400 mg by mouth at bedtime.    [provider]  Melatonin Gummies 2.5 MG CHEW Chew 2.5 mg by mouth at bedtime as needed (sleep).    [provider]  Methoxy PEG-Epoetin  Beta (MIRCERA IJ) Mircera  04/25/23 04/23/24  [provider]  mirtazapine  (REMERON ) 30 MG tablet Take 30 mg by mouth at bedtime. 10/15/21   [provider]  Multiple Vitamins-Minerals (MULTIVITAMIN WITH MINERALS) tablet Take 1 tablet by mouth daily. + Zinc    [provider]  nitroGLYCERIN  (NITROLINGUAL ) 0.4 MG/SPRAY spray Place 1 spray under the tongue every 5 (five) minutes x 3 doses as needed. 02/09/23   [facility], Warren Haber, PA  NOVOLOG  100 UNIT/ML injection Inject 25 Units into the skin continuous. Insulin  pump 02/21/22   [provider]  OVER THE COUNTER MEDICATION Take 1  capsule by mouth daily. Conjugated linoleic acid    [provider]  prednisoLONE acetate (PRED FORTE) 1 % ophthalmic suspension Place 1 drop into both eyes 2 (two) times daily.    [provider]  PREMARIN  vaginal cream Place 1 applicator vaginally daily as needed (irritation). 03/24/21   [provider]  thyroid  (ARMOUR THYROID ) 60 MG tablet Take 60 mg by mouth daily before breakfast. 07/30/21   [provider]  torsemide  ([name] ) 20 MG tablet Take 2 tablets (40 mg total) by mouth daily. Patient taking differently: Take 40 mg by mouth 2 (two) times daily. 10/10/22   Von Grumbling, PA-C  valACYclovir  (VALTREX ) 500 MG tablet Take 500 mg by mouth daily as needed (Flair up).    [provider]      Allergies    Morphine , Penicillins, Tramadol, Acetaminophen , Amitriptyline, Codeine, Ibuprofen, Krill oil, Lactose intolerance (gi), Losartan, Propoxyphene, Shellfish allergy, Statins, Sulfamethoxazole, Sulfites, Ace inhibitors, Fluconazole, and Norvasc  [amlodipine  besylate]    Review of Systems   Review of Systems  Physical Exam Updated Vital Signs BP (!) 169/73   Pulse 70   Temp 97.7 F (36.5 C) (Temporal)   Resp 20   SpO2 100%  Physical Exam Vitals and nursing note reviewed.  Constitutional:      General: She is not in acute distress.    Appearance: She is well-developed.  HENT:     Head: Normocephalic and atraumatic.  Eyes:     Conjunctiva/sclera: Conjunctivae normal.  Cardiovascular:     Rate and Rhythm: Normal rate and regular rhythm.     Heart sounds: No murmur heard. Pulmonary:     Effort: Pulmonary effort is normal. No respiratory distress.     Breath sounds: Normal breath sounds.  Abdominal:     Palpations: Abdomen is soft.     Tenderness: There is no abdominal tenderness.  Musculoskeletal:        General: No swelling.     Cervical back: Neck supple.  Skin:    General: Skin is warm and dry.     Capillary Refill: Capillary  refill takes less than 2 seconds.  Neurological:     General: No focal deficit present.     Mental Status: She is alert and oriented to person, place, and time.  Psychiatric:        Mood and Affect: Mood normal.     ED Results / Procedures / Treatments   Labs (all labs ordered are listed, but only abnormal results are displayed) Labs Reviewed  CBC WITH DIFFERENTIAL/PLATELET  BASIC METABOLIC PANEL WITH GFR    EKG None  Radiology No results found.  Procedures Procedures    Medications Ordered in ED Medications - No data to display  ED Course/ Medical Decision Making/ A&P Clinical Course as of 05/20/23 1437  Sat May 20, 2023  1402 Here due to broken dialysis catheter, pending labs and  EKG to rule out hyper-K or other need for emergent dialysis but she has no shortness of breath or signs of fluid overload.  She is not hypoxic.  I spoke with Dr. Cindra Cree on-call for nephrology.  He states that the patient is planning on getting the PD access in the future this is not something that is going to be able to be addressed today, recommend calling IR for patient to get new HD access.  He would like to be called once she has access so they can figure out when she is able to be dialyzed. [CB]    Clinical Course User Index [CB] Aimee Houseman, PA-C                                 Medical Decision Making Patient here because her tubing of her dialysis catheter is broken, she has a tunneled dialysis catheter in the right upper chest.  She does not need emergent dialysis based on labs and EKG and vitals and exam.  She is well-appearing.  I consulted with Dr. Cindra Cree who is on-call for nephrology as nephrology placed the catheter.  His group cannot get this placed this weekend, he has been reach out to IR to try to get this placed.  IR is not able to do it until Monday, I Spoke with PA Lambert Pillion, and was suggested that reaching out to vascular surgery, they are also not able to do it  until Monday.  I reach back out to nephrology.  He is agreeable with patient getting this done on Monday if IR can arrange it.  I have paged IR again, he states if they are not able to schedule this for Monday, the patient can contact her dialysis center on Monday and asked them to set it up as this is how they arrange access through dialysis centers as an outpatient.  I spoke with Dr. Darylene Epley says it could be done on Monday.  I spoke with the OR nurse CAC who is going to have it put on the schedule.  She states they will call the patient to set that up and let her know the time.  Amount and/or Complexity of Data Reviewed Labs: ordered.           Final Clinical Impression(s) / ED Diagnoses Final diagnoses:  None    Rx / DC Orders ED Discharge Orders     None         Aimee Houseman, PA-C 05/20/23 [address] 05/20/23 1707    Ninetta Basket, MD 05/21/23 1001

## 2023-05-22 ENCOUNTER — Other Ambulatory Visit: Payer: Self-pay | Admitting: Interventional Radiology

## 2023-05-22 ENCOUNTER — Other Ambulatory Visit (HOSPITAL_COMMUNITY): Payer: Self-pay | Admitting: Interventional Radiology

## 2023-05-22 ENCOUNTER — Ambulatory Visit (HOSPITAL_COMMUNITY)
Admission: RE | Admit: 2023-05-22 | Discharge: 2023-05-22 | Disposition: A | Discharge status: Home / Self Care | Source: Ambulatory Visit | Attending: Interventional Radiology

## 2023-05-22 DIAGNOSIS — T8241XA Breakdown (mechanical) of vascular dialysis catheter, initial encounter: Secondary | ICD-10-CM | POA: Insufficient documentation

## 2023-05-22 HISTORY — PX: IR FLUORO GUIDE CV LINE RIGHT: IMG2283

## 2023-05-22 MED ORDER — LIDOCAINE-EPINEPHRINE 1 %-1:100000 IJ SOLN
INTRAMUSCULAR | Status: AC
  Filled 2023-05-22: qty 1

## 2023-05-22 MED ORDER — LIDOCAINE-EPINEPHRINE 1 %-1:100000 IJ SOLN
20.0000 mL | Freq: Once | INTRAMUSCULAR | Status: AC
  Administered 2023-05-22: 10 mL

## 2023-05-22 MED ORDER — VANCOMYCIN HCL IN DEXTROSE 1-5 GM/200ML-% IV SOLN
1000.0000 mg | INTRAVENOUS | Status: AC
  Administered 2023-05-22: 1000 mg via INTRAVENOUS

## 2023-05-22 MED ORDER — HEPARIN SODIUM (PORCINE) 1000 UNIT/ML IJ SOLN
10000.0000 [IU] | Freq: Once | INTRAMUSCULAR | Status: AC
  Administered 2023-05-22: 3.2 mL via INTRAVENOUS

## 2023-05-22 MED ORDER — HEPARIN SODIUM (PORCINE) 1000 UNIT/ML IJ SOLN
INTRAMUSCULAR | Status: AC
  Filled 2023-05-22: qty 10

## 2023-05-22 MED ORDER — VANCOMYCIN HCL IN DEXTROSE 1-5 GM/200ML-% IV SOLN
INTRAVENOUS | Status: AC
  Filled 2023-05-22: qty 200

## 2023-05-22 NOTE — Procedures (Signed)
  Procedure:  Exchange tunneled HD CVC R internal jugular Palindrome 19 Preprocedure diagnosis: The encounter diagnosis was Hemodialysis catheter dysfunction, initial encounter (HCC). Postprocedure diagnosis: same EBL:    minimal Complications:   none immediate  See full dictation in [employer].  Nicky Barrack MD Main # [phone] Pager  [phone] Mobile [phone]

## 2023-05-29 ENCOUNTER — Inpatient Hospital Stay: Attending: Hematology & Oncology

## 2023-05-29 ENCOUNTER — Encounter: Payer: Self-pay | Admitting: Hematology & Oncology

## 2023-05-29 ENCOUNTER — Inpatient Hospital Stay (HOSPITAL_BASED_OUTPATIENT_CLINIC_OR_DEPARTMENT_OTHER): Admitting: Hematology & Oncology

## 2023-05-29 ENCOUNTER — Other Ambulatory Visit: Payer: Self-pay

## 2023-05-29 ENCOUNTER — Inpatient Hospital Stay

## 2023-05-29 VITALS — BP 127/60 | HR 64 | Temp 97.9°F | Resp 18 | Ht 59.0 in | Wt 106.0 lb

## 2023-05-29 DIAGNOSIS — E1059 Type 1 diabetes mellitus with other circulatory complications: Secondary | ICD-10-CM

## 2023-05-29 DIAGNOSIS — D631 Anemia in chronic kidney disease: Secondary | ICD-10-CM

## 2023-05-29 DIAGNOSIS — Z794 Long term (current) use of insulin: Secondary | ICD-10-CM | POA: Diagnosis not present

## 2023-05-29 DIAGNOSIS — Z992 Dependence on renal dialysis: Secondary | ICD-10-CM | POA: Diagnosis not present

## 2023-05-29 DIAGNOSIS — I120 Hypertensive chronic kidney disease with stage 5 chronic kidney disease or end stage renal disease: Secondary | ICD-10-CM | POA: Diagnosis present

## 2023-05-29 DIAGNOSIS — N186 End stage renal disease: Secondary | ICD-10-CM | POA: Diagnosis not present

## 2023-05-29 DIAGNOSIS — N1831 Chronic kidney disease, stage 3a: Secondary | ICD-10-CM | POA: Diagnosis present

## 2023-05-29 DIAGNOSIS — I2583 Coronary atherosclerosis due to lipid rich plaque: Secondary | ICD-10-CM

## 2023-05-29 DIAGNOSIS — I2510 Atherosclerotic heart disease of native coronary artery without angina pectoris: Secondary | ICD-10-CM

## 2023-05-29 DIAGNOSIS — D508 Other iron deficiency anemias: Secondary | ICD-10-CM

## 2023-05-29 DIAGNOSIS — E1022 Type 1 diabetes mellitus with diabetic chronic kidney disease: Secondary | ICD-10-CM | POA: Diagnosis not present

## 2023-05-29 LAB — CMP (CANCER CENTER ONLY)
ALT: 9 U/L (ref 0–44)
AST: 19 U/L (ref 15–41)
Albumin: 3.9 g/dL (ref 3.5–5.0)
Alkaline Phosphatase: 105 U/L (ref 38–126)
Anion gap: 11 (ref 5–15)
BUN: 34 mg/dL — ABNORMAL HIGH (ref 8–23)
CO2: 28 mmol/L (ref 22–32)
Calcium: 8.9 mg/dL (ref 8.9–10.3)
Chloride: 94 mmol/L — ABNORMAL LOW (ref 98–111)
Creatinine: 3.78 mg/dL — ABNORMAL HIGH (ref 0.44–1.00)
GFR, Estimated: 12 mL/min — ABNORMAL LOW (ref >60)
Glucose, Bld: 239 mg/dL — ABNORMAL HIGH (ref 70–99)
Potassium: 4.4 mmol/L (ref 3.5–5.1)
Sodium: 133 mmol/L — ABNORMAL LOW (ref 135–145)
Total Bilirubin: 0.4 mg/dL (ref 0.0–1.2)
Total Protein: 6 g/dL — ABNORMAL LOW (ref 6.5–8.1)

## 2023-05-29 LAB — CBC WITH DIFFERENTIAL (CANCER CENTER ONLY)
Abs Immature Granulocytes: 0.02 10*3/uL (ref 0.00–0.07)
Basophils Absolute: 0.1 10*3/uL (ref 0.0–0.1)
Basophils Relative: 2 %
Eosinophils Absolute: 0.7 10*3/uL — ABNORMAL HIGH (ref 0.0–0.5)
Eosinophils Relative: 10 %
HCT: 40.4 % (ref 36.0–46.0)
Hemoglobin: 13.1 g/dL (ref 12.0–15.0)
Immature Granulocytes: 0 %
Lymphocytes Relative: 15 %
Lymphs Abs: 1.1 10*3/uL (ref 0.7–4.0)
MCH: 30 pg (ref 26.0–34.0)
MCHC: 32.4 g/dL (ref 30.0–36.0)
MCV: 92.7 fL (ref 80.0–100.0)
Monocytes Absolute: 0.7 10*3/uL (ref 0.1–1.0)
Monocytes Relative: 9 %
Neutro Abs: 4.7 10*3/uL (ref 1.7–7.7)
Neutrophils Relative %: 64 %
Platelet Count: 260 10*3/uL (ref 150–400)
RBC: 4.36 MIL/uL (ref 3.87–5.11)
RDW: 14.8 % (ref 11.5–15.5)
WBC Count: 7.3 10*3/uL (ref 4.0–10.5)
nRBC: 0 % (ref 0.0–0.2)

## 2023-05-29 LAB — RETICULOCYTES
Immature Retic Fract: 6.9 % (ref 2.3–15.9)
RBC.: 4.44 MIL/uL (ref 3.87–5.11)
Retic Count, Absolute: 50.6 10*3/uL (ref 19.0–186.0)
Retic Ct Pct: 1.2 % (ref 0.4–3.1)

## 2023-05-29 LAB — FERRITIN: Ferritin: 715 ng/mL — ABNORMAL HIGH (ref 11–307)

## 2023-05-29 LAB — IRON AND IRON BINDING CAPACITY (CC-WL,HP ONLY)
Iron: 93 ug/dL (ref 28–170)
Saturation Ratios: 36 % — ABNORMAL HIGH (ref 10.4–31.8)
TIBC: 256 ug/dL (ref 250–450)
UIBC: 163 ug/dL (ref 148–442)

## 2023-05-29 NOTE — Progress Notes (Signed)
 Hematology and Oncology Follow Up Visit  Brittney Pace [id number] August 27, 1955 68 y.o. 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  She now is on dialysis.  Unfortunately, she is having quite a few problems with her dialysis catheter.  Maybe, is like to be repositioned or maybe even be replaced.  She really is managing as well as possible.  Her blood counts are doing a lot better.  I think she probably has lost a little bit of weight.  Blood pressure is much better..  She has had no obvious bleeding.  She has had no fever.  She has had no cough or increased shortness of breath.  There has been no leg swelling.  She has had no rashes.  Her last iron studies that were done back in April showed a ferritin of 556 and iron saturation of 29%.  Her blood sugars are still on the high side.  Her appetite does seem to be doing a little bit better.  Overall, I would have to say that her performance status is probably ECOG 1.   Wt Readings from Last 3 Encounters:  05/02/23 111 lb (50.3 kg)  04/26/23 108 lb (49 kg)  04/12/23 108 lb 12.8 oz (49.4 kg)    Medications:  Allergies as of 05/29/2023       Reactions   Morphine  Anaphylaxis, Shortness Of Breath   Penicillins Hives   Tolerated ANCEF  on 04/30/20 Has patient had a PCN reaction causing immediate rash, facial/tongue/throat swelling, SOB or lightheadedness with hypotension: Yes Has patient had a PCN reaction causing severe rash involving mucus membranes or skin necrosis: No Has patient had a PCN reaction that required hospitalization No Has patient had a PCN reaction occurring within the last 10 years: No If all of the above answers are "NO", then may proceed with Cephalosporin use.   Tramadol Anaphylaxis   Acetaminophen   Other (See Comments)   Alters insulin  pump readings   Amitriptyline Other (See Comments)   Severe headache/ out of body feeling   Codeine Other (See Comments)   Severe headaches/ out of body feeling   Ibuprofen Other (See Comments)   Messes up CGM reading on glucose monitor   Krill Oil Diarrhea, Itching, Nausea And Vomiting   Lactose Intolerance (gi) Diarrhea   Losartan Cough   Propoxyphene Other (See Comments)   Severe headaches / out of body feeling   Shellfish Allergy Diarrhea, Nausea And Vomiting   Statins Other (See Comments)   Muscle pains Other reaction(s): Other   Sulfamethoxazole Other (See Comments)   Mouth ulcers   Sulfites Itching, Other (See Comments)   Mouth ulcers   Ace Inhibitors    Other Reaction(s): Unknown   Fluconazole    Other reaction(s): Contact Dermatitis (intolerance)   Norvasc  [amlodipine  Besylate] Swelling        Medication List        Accurate as of May 29, 2023 12:45 PM. If you have any questions, ask your nurse or doctor.          Accu-Chek Guide test strip Generic drug: glucose blood 3 (three) times daily.   ALPHA-D-GALACTOSIDASE PO Take 600 mg by mouth daily.   ARANESP  IJ Inject as directed.   Armour Thyroid  60 MG tablet Generic  drug: thyroid  Take 60 mg by mouth daily before breakfast.   aspirin  EC 325 MG tablet Take 1 tablet (325 mg total) by mouth daily. What changed: when to take this   b complex-vitamin c -folic acid  0.8 MG Tabs tablet Take by mouth.   carvedilol  12.5 MG tablet Commonly known as: COREG  TAKE 1 TABLET BY MOUTH TWICE DAILY WITH A MEAL   cetirizine 10 MG tablet Commonly known as: ZYRTEC Take 10 mg by mouth at bedtime.   Dexcom G7 Sensor Misc Inject 1 each into the skin as directed. Every 10 days change it.   diphenoxylate -atropine  2.5-0.025 MG tablet Commonly known as: LOMOTIL  TAKE 1 TABLET BY MOUTH 4 TIMES DAILY AS NEEDED FOR DIARRHEA OR  LOOSE  STOOLS   esomeprazole 20 MG capsule Commonly  known as: NEXIUM Take 20 mg by mouth every morning.   HECTOROL IV Doxercalciferol (Hectorol)   hydrALAZINE  50 MG tablet Commonly known as: APRESOLINE  Take 1.5 tablets (75 mg total) by mouth in the morning and at bedtime. What changed:  how much to take when to take this   hydrOXYzine  10 MG tablet Commonly known as: ATARAX  Take 1 tablet (10 mg total) by mouth every 6 (six) hours as needed. What changed: reasons to take this   isosorbide  mononitrate 60 MG 24 hr tablet Commonly known as: IMDUR  Take 60 mg by mouth daily.   ketoconazole  2 % shampoo Commonly known as: NIZORAL  Apply 1 Application topically 2 (two) times a week.   ketorolac  0.5 % ophthalmic solution Commonly known as: ACULAR  Place 1 drop into both eyes 2 (two) times daily.   lamoTRIgine  150 MG tablet Commonly known as: LAMICTAL  Take 150 mg by mouth at bedtime.   MAGNESIUM  GLUCONATE PO Take 400 mg by mouth at bedtime.   Melatonin Gummies 2.5 MG Chew Chew 2.5 mg by mouth at bedtime as needed (sleep).   MIRCERA IJ Mircera   mirtazapine  30 MG tablet Commonly known as: REMERON  Take 30 mg by mouth at bedtime.   multivitamin with minerals tablet Take 1 tablet by mouth daily. + Zinc   nitroGLYCERIN  0.4 MG/SPRAY spray Commonly known as: NITROLINGUAL  Place 1 spray under the tongue every 5 (five) minutes x 3 doses as needed.   NovoLOG  100 UNIT/ML injection Generic drug: insulin  aspart Inject 25 Units into the skin continuous. Insulin  pump   OVER THE COUNTER MEDICATION Take 1 capsule by mouth daily. Conjugated linoleic acid   prednisoLONE acetate 1 % ophthalmic suspension Commonly known as: PRED FORTE Place 1 drop into both eyes 2 (two) times daily.   Premarin  vaginal cream Generic drug: conjugated estrogens  Place 1 applicator vaginally daily as needed (irritation).   torsemide  20 MG tablet Commonly known as: [name]  Take 2 tablets (40 mg total) by mouth daily. What changed: when to take this    valACYclovir  500 MG tablet Commonly known as: VALTREX  Take 500 mg by mouth daily as needed (Flair up).        Allergies:  Allergies  Allergen Reactions   Morphine  Anaphylaxis and Shortness Of Breath   Penicillins Hives    Tolerated ANCEF  on 04/30/20 Has patient had a PCN reaction causing immediate rash, facial/tongue/throat swelling, SOB or lightheadedness with hypotension: Yes Has patient had a PCN reaction causing severe rash involving mucus membranes or skin necrosis: No Has patient had a PCN reaction that required hospitalization No Has patient had a PCN reaction occurring within the last 10 years: No If all of the above answers are "NO", then  may proceed with Cephalosporin use.   Tramadol Anaphylaxis   Acetaminophen  Other (See Comments)    Alters insulin  pump readings    Amitriptyline Other (See Comments)    Severe headache/ out of body feeling   Codeine Other (See Comments)    Severe headaches/ out of body feeling    Ibuprofen Other (See Comments)    Messes up CGM reading on glucose monitor     Krill Oil Diarrhea, Itching and Nausea And Vomiting   Lactose Intolerance (Gi) Diarrhea   Losartan Cough   Propoxyphene Other (See Comments)    Severe headaches / out of body feeling    Shellfish Allergy Diarrhea and Nausea And Vomiting   Statins Other (See Comments)    Muscle pains Other reaction(s): Other   Sulfamethoxazole Other (See Comments)    Mouth ulcers    Sulfites Itching and Other (See Comments)    Mouth ulcers   Ace Inhibitors     Other Reaction(s): Unknown   Fluconazole     Other reaction(s): Contact Dermatitis (intolerance)   Norvasc  [Amlodipine  Besylate] Swelling    Past Medical History, Surgical history, Social history, and Family History were reviewed and updated.  Review of Systems: Review of Systems  Constitutional:  Positive for malaise/fatigue. Negative for chills and fever.  HENT: Negative.    Eyes:  Positive for blurred vision.   Respiratory:  Positive for shortness of breath (mild).   Cardiovascular:  Negative for leg swelling.  Gastrointestinal:  Negative for abdominal pain and diarrhea.  Genitourinary:  Negative for dysuria, flank pain, frequency, hematuria and urgency.  Musculoskeletal:  Positive for joint pain and myalgias.  Skin:  Negative for rash.  Neurological:  Positive for headaches.  Endo/Heme/Allergies: Negative.   Psychiatric/Behavioral: Negative.     Wt Readings from Last 3 Encounters:  05/02/23 111 lb (50.3 kg)  04/26/23 108 lb (49 kg)  04/12/23 108 lb 12.8 oz (49.4 kg)   Vital signs show a temperature 97.6.  Pulse 60.  Blood pressure 127/60.  Weight is 106 pounds.  .    Physical Exam Vitals reviewed.  Constitutional:      Comments: Gentle use of cane for ambulation   HENT:     Head: Normocephalic and atraumatic.  Eyes:     Pupils: Pupils are equal, round, and reactive to light.  Cardiovascular:     Rate and Rhythm: Normal rate and regular rhythm.     Heart sounds: Normal heart sounds.  Pulmonary:     Effort: Pulmonary effort is normal.     Breath sounds: Normal breath sounds.  Musculoskeletal:     Cervical back: Normal range of motion.     Right lower leg: No edema.     Left lower leg: No edema.  Lymphadenopathy:     Cervical: No cervical adenopathy.  Skin:    General: Skin is warm and dry.     Findings: No erythema or rash.  Neurological:     Mental Status: She is alert and oriented to person, place, and time. Mental status is at baseline.     Comments: Cranial nerves are intact  Ambulating slowly with cane  Psychiatric:        Behavior: Behavior normal.        Thought Content: Thought content normal.        Judgment: Judgment normal.     Lab Results  Component Value Date   WBC 7.3 05/29/2023   HGB 13.1 05/29/2023   HCT 40.4 05/29/2023  MCV 92.7 05/29/2023   PLT 260 05/29/2023   Lab Results  Component Value Date   FERRITIN 556 (H) 04/26/2023   IRON 71  04/26/2023   TIBC 245 (L) 04/26/2023   UIBC 174 04/26/2023   IRONPCTSAT 29 04/26/2023   Lab Results  Component Value Date   RETICCTPCT 2.5 04/26/2023   RBC 4.36 05/29/2023   No results found for: "KPAFRELGTCHN", "LAMBDASER", "KAPLAMBRATIO" No results found for: "IGGSERUM", "IGA", "IGMSERUM" No results found for: "TOTALPROTELP", "ALBUMINELP", "A1GS", "A2GS", "BETS", "BETA2SER", "GAMS", "MSPIKE", "SPEI"   Chemistry      Component Value Date/Time   NA 139 05/20/2023 1335   NA 138 07/17/2017 1128   K 5.1 05/20/2023 1335   CL 102 05/20/2023 1335   CO2 22 05/20/2023 1335   BUN 45 (H) 05/20/2023 1335   BUN 23 07/17/2017 1128   CREATININE 3.43 (H) 05/20/2023 1335   CREATININE 2.24 (H) 04/26/2023 1511   CREATININE 2.45 (H) 03/03/2022 1129      Component Value Date/Time   CALCIUM  8.9 05/20/2023 1335   CALCIUM  8.7 02/06/2023 0000   ALKPHOS 91 04/26/2023 1511   AST 18 04/26/2023 1511   ALT 9 04/26/2023 1511   BILITOT 0.4 04/26/2023 1511      Impression and Plan: Ms. Merkling is a very pleasant 68 yo female with multifactorial anemia. She has CKD, HTN, DM1.   Thankfully, she does not need any Aranesp  today.  I would think that her iron should be okay.  Hopefully, the issue with the catheter is going to be taken care of.  I think we can probably get her back now in about 6 weeks or so.   Ivor Mars, MD 5/5/202512:45 PM

## 2023-06-01 ENCOUNTER — Encounter (HOSPITAL_COMMUNITY)
Admission: RE | Disposition: A | Discharge status: Home / Self Care | Payer: Self-pay | Source: Home / Self Care | Attending: Nephrology

## 2023-06-01 ENCOUNTER — Ambulatory Visit (HOSPITAL_COMMUNITY)
Admission: RE | Admit: 2023-06-01 | Discharge: 2023-06-01 | Disposition: A | Discharge status: Home / Self Care | Attending: Nephrology | Admitting: Nephrology

## 2023-06-01 ENCOUNTER — Other Ambulatory Visit: Payer: Self-pay

## 2023-06-01 DIAGNOSIS — D631 Anemia in chronic kidney disease: Secondary | ICD-10-CM | POA: Diagnosis not present

## 2023-06-01 DIAGNOSIS — N250 Renal osteodystrophy: Secondary | ICD-10-CM | POA: Insufficient documentation

## 2023-06-01 DIAGNOSIS — I2510 Atherosclerotic heart disease of native coronary artery without angina pectoris: Secondary | ICD-10-CM | POA: Diagnosis not present

## 2023-06-01 DIAGNOSIS — T8249XA Other complication of vascular dialysis catheter, initial encounter: Secondary | ICD-10-CM | POA: Insufficient documentation

## 2023-06-01 DIAGNOSIS — Y839 Surgical procedure, unspecified as the cause of abnormal reaction of the patient, or of later complication, without mention of misadventure at the time of the procedure: Secondary | ICD-10-CM | POA: Diagnosis not present

## 2023-06-01 DIAGNOSIS — I132 Hypertensive heart and chronic kidney disease with heart failure and with stage 5 chronic kidney disease, or end stage renal disease: Secondary | ICD-10-CM | POA: Insufficient documentation

## 2023-06-01 DIAGNOSIS — N186 End stage renal disease: Secondary | ICD-10-CM | POA: Diagnosis not present

## 2023-06-01 DIAGNOSIS — Z794 Long term (current) use of insulin: Secondary | ICD-10-CM | POA: Insufficient documentation

## 2023-06-01 DIAGNOSIS — Z992 Dependence on renal dialysis: Secondary | ICD-10-CM | POA: Diagnosis not present

## 2023-06-01 DIAGNOSIS — E1122 Type 2 diabetes mellitus with diabetic chronic kidney disease: Secondary | ICD-10-CM | POA: Diagnosis not present

## 2023-06-01 DIAGNOSIS — Z79899 Other long term (current) drug therapy: Secondary | ICD-10-CM | POA: Diagnosis not present

## 2023-06-01 DIAGNOSIS — I509 Heart failure, unspecified: Secondary | ICD-10-CM | POA: Diagnosis not present

## 2023-06-01 DIAGNOSIS — E039 Hypothyroidism, unspecified: Secondary | ICD-10-CM | POA: Insufficient documentation

## 2023-06-01 DIAGNOSIS — Z87891 Personal history of nicotine dependence: Secondary | ICD-10-CM | POA: Diagnosis not present

## 2023-06-01 DIAGNOSIS — Z79890 Hormone replacement therapy (postmenopausal): Secondary | ICD-10-CM | POA: Insufficient documentation

## 2023-06-01 HISTORY — PX: TUNNELLED CATHETER EXCHANGE: CATH118373

## 2023-06-01 SURGERY — TUNNELLED CATHETER EXCHANGE
Anesthesia: LOCAL

## 2023-06-01 MED ORDER — MIDAZOLAM HCL 2 MG/2ML IJ SOLN
INTRAMUSCULAR | Status: AC
  Filled 2023-06-01: qty 2

## 2023-06-01 MED ORDER — IODIXANOL 320 MG/ML IV SOLN
INTRAVENOUS | Status: DC | PRN
Start: 2023-06-01 — End: 2023-06-01
  Administered 2023-06-01: 2 mL

## 2023-06-01 MED ORDER — HEPARIN SODIUM (PORCINE) 1000 UNIT/ML IJ SOLN
INTRAMUSCULAR | Status: DC | PRN
  Administered 2023-06-01: 5000 [IU] via INTRAVENOUS

## 2023-06-01 MED ORDER — HEPARIN (PORCINE) IN NACL 2000-0.9 UNIT/L-% IV SOLN
INTRAVENOUS | Status: DC | PRN
  Administered 2023-06-01: 1000 mL

## 2023-06-01 MED ORDER — HEPARIN SODIUM (PORCINE) 1000 UNIT/ML IJ SOLN
INTRAMUSCULAR | Status: AC
Start: 2023-06-01 — End: ?
  Filled 2023-06-01: qty 10

## 2023-06-01 MED ORDER — LIDOCAINE HCL (PF) 1 % IJ SOLN
INTRAMUSCULAR | Status: DC | PRN
  Administered 2023-06-01: 20 mL via INTRADERMAL

## 2023-06-01 MED ORDER — LIDOCAINE HCL (PF) 1 % IJ SOLN
INTRAMUSCULAR | Status: AC
  Filled 2023-06-01: qty 30

## 2023-06-01 MED ORDER — FENTANYL CITRATE (PF) 100 MCG/2ML IJ SOLN
INTRAMUSCULAR | Status: AC
  Filled 2023-06-01: qty 2

## 2023-06-01 MED ORDER — FENTANYL CITRATE (PF) 100 MCG/2ML IJ SOLN
INTRAMUSCULAR | Status: DC | PRN
  Administered 2023-06-01: 50 ug via INTRAVENOUS

## 2023-06-01 MED ORDER — MIDAZOLAM HCL 2 MG/2ML IJ SOLN
INTRAMUSCULAR | Status: DC | PRN
  Administered 2023-06-01: 1 mg via INTRAVENOUS

## 2023-06-01 SURGICAL SUPPLY — 3 items
CATH PALINDROME-P 19CM W/VT (CATHETERS) IMPLANT
GLIDEWIRE STIFF .35X180X3 HYDR (WIRE) IMPLANT
TRAY PV CATH (CUSTOM PROCEDURE TRAY) IMPLANT

## 2023-06-01 NOTE — Op Note (Signed)
 Patient presents with severe pain whenever she moves her head or neck right over the venotomy site; she has a right IJ tunneled hemodialysis catheter (19 cm cuff to tip- Palindrome) that was exchanged with new tunnel by [facility] a week ago on May 22, 2023.  Retunneling was performed because the catheter was sitting over her nipple and causing discomfort.    Summary:  1) The patient had a successful 19 cm CTT Palindrome hemodialysis catheter exchange in the right internal jugular vein.  Pain likely because the cuff was right over the clavicle; fortunately able to exchange and retracted catheter so the cuff is well below the clavicle.  Patient states that the pain is already much improved. 2) No sheath noted through either port. 3) Okay to use catheter immediately.  Description of procedure: The right neck, chest and the catheter were prepped and draped in the usual sterile fashion. The exit site and adjacent tunnel tract were anesthetized with lidocaine  1% with epinephrine . The cuff was dissected free with a curved Kelly and manual traction. The catheter was withdrawn and venogram through each of the ports was performed; there was no fibrin sheath evident through either port.   A hydrophilic wire was manipulated and advanced through the arterial port and the tip was parked in the IVC. The catheter was completely removed and noted to be entirely intact. A new 19 cm cuff to tip Palindrome catheter was inserted over the guidewire and the tip parked in the right atrium and at that point the cuff was approximately 1 cm deep to the exit site and well below the clavicle.   Aspiration and flushing of both limbs of the catheter confirmed excellent flow. No kinks were visible on fluoroscopic imaging. Both limbs of the catheter were locked with citrate and sterile caps were placed.   The hub was secured on to the chest wall with 2-0 nylon wing sutures.  Sterile dressings were placed, and the patient returned to  recovery in stable condition.  Sedation: 1 mg Versed , 50 mcg Fentanyl  Sedation time: 16 minutes  Contrast: 2 mL  Monitoring: Because of the patient's comorbid conditions and sedation during the procedure, continuous EKG monitoring and O2 saturation monitoring was performed throughout the procedure by the RN. There were no abnormal arrhythmias encountered.  Complications: None.  Diagnoses:   T82.49XA Other complication of vascular dialysis catheter (Poor flows) N18.6 End stage renal disease  Z99.2 Dialysis dependence  Procedures Coding:  36581 Tunneled catheter exchange 77001  Fluoroscopy guidance for catheter exchange. U9811 Contrast  Recommendations: Remove the suture in 3 weeks. 2.   Report any blood flow problems to [facility].  Discharge: The patient was discharged home in stable condition. The patient was given education regarding the care of the catheter and specific instructions in case of any problems.

## 2023-06-01 NOTE — Discharge Instructions (Signed)

## 2023-06-01 NOTE — H&P (Addendum)
 Chief Complaint: Decreased flows  Interval H&P   The patient has presented today for tunneled catheter insertion to initiate dialysis.  Various methods of treatment have been discussed with the patient.  After consideration of risk, benefits and other options for treatment, the patient has consented to a tunneled catheter insertion.   Risks  of bleeding, pain, infection, nonhealing wound, lung and carotid artery injury were explained to the patient.  The patient's history has been reviewed and the patient has been examined, no changes in status.  Stable for tunneled catheter insertion.  I have reviewed the patient's chart and labs.  Questions were answered to the patient's satisfaction.  Assessment/Plan: ESRD dialyzing MWF Pain at the venotomy site whenever she turns her neck- planning on catheter exchange and will need to open up the venotomy skin site and break up tissue to release the catheter.  She is complaining anytime she moves her neck or head there is severe pain which she did not have with the initial insertion but only after the retunneling.  Renal osteodystrophy - continue binders per home regimen. Anemia - managed with ESA's and IV iron at dialysis center. HTN - resume home regimen.   HPI: Brittney Pace is an 68 y.o. female with a history of CHF, diabetes, coronary atherosclerotic heart disease, CVA, hypothyroidism, ESRD with pain whenever she turns her head/neck since retunneling of a right IJ tunnel catheter.  Retunneling was done on May 22, 2023.    ROS Per HPI.  Chemistry and CBC: Creatinine  Date/Time Value Ref Range Status  05/29/2023 11:56 AM 3.78 (H) 0.44 - 1.00 mg/dL Final  16/10/9602 54:09 PM 2.24 (H) 0.44 - 1.00 mg/dL Final  81/19/1478 29:56 AM 2.98 (H) 0.44 - 1.00 mg/dL Final  21/30/8657 84:69 AM 2.64 (H) 0.44 - 1.00 mg/dL Final  62/95/2841 32:44 PM 2.63 (H) 0.44 - 1.00 mg/dL Final  01/26/7251 66:44 PM 2.35 (H) 0.44 - 1.00 mg/dL Final  03/47/4259 56:38  PM 2.45 (H) 0.44 - 1.00 mg/dL Final  75/64/3329 51:88 PM 2.52 (H) 0.44 - 1.00 mg/dL Final  41/66/0630 16:01 PM 3.05 (H) 0.44 - 1.00 mg/dL Final  09/32/3557 32:20 AM 2.65 (H) 0.44 - 1.00 mg/dL Final  25/42/7062 37:62 AM 2.58 (H) 0.44 - 1.00 mg/dL Final  83/15/1761 60:73 AM 2.43 (H) 0.44 - 1.00 mg/dL Final  71/06/2692 85:46 PM 2.65 (H) 0.44 - 1.00 mg/dL Final  27/03/5007 38:18 PM 2.55 (H) 0.44 - 1.00 mg/dL Final  29/93/7169 67:89 AM 2.38 (H) 0.44 - 1.00 mg/dL Final  38/10/1749 02:58 AM 2.35 (H) 0.44 - 1.00 mg/dL Final  52/77/8242 35:36 PM 3.06 (H) 0.44 - 1.00 mg/dL Final  14/43/1540 08:67 PM 2.32 (H) 0.44 - 1.00 mg/dL Final  61/95/0932 67:12 AM 2.26 (H) 0.44 - 1.00 mg/dL Final  45/80/9983 38:25 AM 1.94 (H) 0.44 - 1.00 mg/dL Final  05/39/7673 41:93 AM 2.21 (H) 0.44 - 1.00 mg/dL Final  79/02/4095 35:32 PM 2.17 (H) 0.44 - 1.00 mg/dL Final  99/24/2683 41:96 AM 1.80 (H) 0.44 - 1.00 mg/dL Final  22/29/7989 21:19 PM 1.77 (H) 0.44 - 1.00 mg/dL Final  41/74/0814 48:18 PM 1.97 (H) 0.44 - 1.00 mg/dL Final  56/31/4970 26:37 PM 1.85 (H) 0.44 - 1.00 mg/dL Final  85/88/5027 74:12 PM 1.55 (H) 0.44 - 1.00 mg/dL Final  87/86/7672 09:47 PM 1.82 (H) 0.44 - 1.00 mg/dL Final  09/62/8366 29:47 PM 1.64 (H) 0.44 - 1.00 mg/dL Final  65/46/5035 46:56 AM 1.54 (H) 0.44 - 1.00 mg/dL Final  81/27/5170 01:74  PM 1.26 (H) 0.44 - 1.00 mg/dL Final  16/10/9602 54:09 PM 1.22 (H) 0.44 - 1.00 mg/dL Final  81/19/1478 29:56 AM 1.34 (H) 0.44 - 1.00 mg/dL Final  21/30/8657 84:69 PM 1.27 (H) 0.44 - 1.00 mg/dL Final  62/95/2841 32:44 PM 1.23 (H) 0.44 - 1.00 mg/dL Final  01/26/7251 66:44 PM 1.41 (H) 0.44 - 1.00 mg/dL Final  03/47/4259 56:38 PM 1.24 (H) 0.44 - 1.00 mg/dL Final  75/64/3329 51:88 PM 1.17 (H) 0.44 - 1.00 mg/dL Final  41/66/0630 16:01 PM 1.48 (H) 0.44 - 1.00 mg/dL Final  09/32/3557 32:20 AM 1.33 (H) 0.44 - 1.00 mg/dL Final  25/42/7062 37:62 PM 1.08 (H) 0.44 - 1.00 mg/dL Final  83/15/1761 60:73 PM 1.47 (H) 0.44 - 1.00  mg/dL Final  71/06/2692 85:46 PM 1.23 (H) 0.44 - 1.00 mg/dL Final  27/03/5007 38:18 PM 1.09 (H) 0.44 - 1.00 mg/dL Final  29/93/7169 67:89 AM 1.20 (H) 0.44 - 1.00 mg/dL Final  38/10/1749 02:58 PM 1.20 (H) 0.44 - 1.00 mg/dL Final  52/77/8242 35:36 PM 1.21 (H) 0.44 - 1.00 mg/dL Final  14/43/1540 08:67 AM 1.25 (H) 0.44 - 1.00 mg/dL Final  61/95/0932 67:12 AM 1.21 (H) 0.44 - 1.00 mg/dL Final  45/80/9983 38:25 PM 1.21 (H) 0.44 - 1.00 mg/dL Final  05/39/7673 41:93 AM 1.19 (H) 0.44 - 1.00 mg/dL Final  79/02/4095 35:32 PM 1.07 (H) 0.44 - 1.00 mg/dL Final   Creat  Date/Time Value Ref Range Status  03/03/2022 11:29 AM 2.45 (H) 0.50 - 1.05 mg/dL Final  99/24/2683 41:96 AM 0.99 0.50 - 0.99 mg/dL Final    Comment:    For patients >11 years of age, the reference limit for Creatinine is approximately 13% higher for people identified as African-American. Aaron Aas   02/23/2016 01:39 PM 1.44 (H) 0.50 - 0.99 mg/dL Final    Comment:      For patients > or = 69 years of age: The upper reference limit for Creatinine is approximately 13% higher for people identified as African-American.      Creatinine, Ser  Date/Time Value Ref Range Status  05/20/2023 01:35 PM 3.43 (H) 0.44 - 1.00 mg/dL Final  22/29/7989 21:19 PM 2.99 (H) 0.44 - 1.00 mg/dL Final  41/74/0814 48:18 AM 2.28 (H) 0.44 - 1.00 mg/dL Final  56/31/4970 26:37 AM 2.54 (H) 0.44 - 1.00 mg/dL Final  85/88/5027 74:12 AM 2.32 (H) 0.44 - 1.00 mg/dL Final  87/86/7672 09:47 PM 1.97 (H) 0.44 - 1.00 mg/dL Final  09/62/8366 29:47 AM 2.33 (H) 0.44 - 1.00 mg/dL Final  65/46/5035 46:56 AM 2.89 (H) 0.44 - 1.00 mg/dL Final  81/27/5170 01:74 AM 2.96 (H) 0.44 - 1.00 mg/dL Final  94/49/6759 16:38 AM 3.22 (H) 0.44 - 1.00 mg/dL Final  46/65/9935 70:17 PM 3.25 (H) 0.44 - 1.00 mg/dL Final  79/39/0300 92:33 PM 3.34 (H) 0.44 - 1.00 mg/dL Final  00/76/2263 33:54 PM 3.52 (H) 0.44 - 1.00 mg/dL Final  56/25/6389 37:34 AM 3.73 (H) 0.44 - 1.00 mg/dL Final  28/76/8115 72:62  AM 3.84 (H) 0.44 - 1.00 mg/dL Final  03/55/9741 63:84 AM 4.02 (H) 0.44 - 1.00 mg/dL Final  53/64/6803 21:22 PM 3.00 (H) 0.44 - 1.00 mg/dL Final  48/25/0037 04:88 PM 3.85 (H) 0.44 - 1.00 mg/dL Final    Comment:    DELTA CHECK NOTED  03/17/2022 07:28 PM 2.78 (H) 0.44 - 1.00 mg/dL Final  89/16/9450 38:88 PM 1.84 (H) 0.44 - 1.00 mg/dL Final  28/00/3491 79:15 PM 1.92 (H) 0.44 - 1.00 mg/dL Final  05/69/7948  02:18 PM 1.28 (H) 0.40 - 1.20 mg/dL Final   Recent Labs  Lab 05/29/23 1156  NA 133*  K 4.4  CL 94*  CO2 28  GLUCOSE 239*  BUN 34*  CREATININE 3.78*  CALCIUM  8.9   Recent Labs  Lab 05/29/23 1156  WBC 7.3  NEUTROABS 4.7  HGB 13.1  HCT 40.4  MCV 92.7  PLT 260   Liver Function Tests: Recent Labs  Lab 05/29/23 1156  AST 19  ALT 9  ALKPHOS 105  BILITOT 0.4  PROT 6.0*  ALBUMIN 3.9   No results for input(s): "LIPASE", "AMYLASE" in the last 168 hours. No results for input(s): "AMMONIA" in the last 168 hours. Cardiac Enzymes: No results for input(s): "CKTOTAL", "CKMB", "CKMBINDEX", "TROPONINI" in the last 168 hours. Iron Studies:  Recent Labs    05/29/23 1156  IRON 93  TIBC 256  FERRITIN 715*   PT/INR: @LABRCNTIP (inr:5)  Xrays/Other Studies: )No results found for this or any previous visit (from the past 48 hours). No results found.  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   Family history of adverse reaction to anesthesia    mother: " while she was under she stopped breathing."   Fibromyalgia    Gastroparesis    GERD (gastroesophageal reflux disease)    Headache    migraines   Hypothyroidism    IBS (irritable bowel syndrome)     Idiopathic edema    Iron deficiency anemia 09/04/2019   Lyme disease    Mitral valve prolapse    Myocardial infarction (HCC)    1 major in 1999 and 2 minor " small vessel disease."   Osteoporosis    Peripheral neuropathy    Peripheral vascular disease (HCC)    Sinus disorder    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   Stroke (HCC)    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" " the second stroke was a lacunar     PSH:   Past Surgical History:  Procedure Laterality Date   ABDOMINAL HYSTERECTOMY     APPENDECTOMY     BREAST SURGERY     B/L biopsy and lumpectomy    CARDIAC CATHETERIZATION     CARPAL TUNNEL RELEASE     CATARACT EXTRACTION W/ INTRAOCULAR LENS IMPLANT     right eye   COLONOSCOPY W/ BIOPSIES AND POLYPECTOMY     CORONARY ARTERY BYPASS GRAFT     coronary artery stents     at LAD and LIMA   DIALYSIS/PERMA CATHETER INSERTION N/A 03/24/2023   Procedure: DIALYSIS/PERMA CATHETER INSERTION;  Surgeon: Melodie Spry, MD;  Location: [facility] INVASIVE CV LAB;  Service: Cardiovascular;  Laterality: N/A;   ECTOPIC PREGNANCY SURGERY     IR FLUORO GUIDE CV LINE RIGHT  05/22/2023   NASAL SEPTUM SURGERY     OPEN REDUCTION INTERNAL FIXATION (ORIF) DISTAL RADIAL FRACTURE Right 05/06/2015   Procedure: OPEN REDUCTION INTERNAL FIXATION (ORIF) RIGHT DISTAL RADIAL FRACTURE AND REPAIRS AS NEEDED;  Surgeon: Arvil Birks, MD;  Location: [facility] OR;  Service: Orthopedics;  Laterality: Right;   ORIF  HUMERUS FRACTURE Right 04/30/2020   Procedure: OPEN REDUCTION INTERNAL FIXATION (ORIF) PROXIMAL HUMERUS FRACTURE;  Surgeon: Ellard Gunning, MD;  Location: [facility] ORS;  Service: Orthopedics;  Laterality: Right;    ORIF WRIST FRACTURE Left 11/05/2014   ORIF WRIST FRACTURE Left 11/05/2014   Procedure: OPEN REDUCTION INTERNAL FIXATION (ORIF) LEFT WRIST FRACTURE AND REPAIR AS INDICATED;  Surgeon: Arvil Birks, MD;  Location: [facility] OR;  Service: Orthopedics;  Laterality: Left;   TRIGGER FINGER RELEASE      Allergies:   Allergies  Allergen Reactions   Morphine  Anaphylaxis and Shortness Of Breath   Penicillins Hives    Tolerated ANCEF  on 04/30/20 Has patient had a PCN reaction causing immediate rash, facial/tongue/throat swelling, SOB or lightheadedness with hypotension: Yes Has patient had a PCN reaction causing severe rash involving mucus membranes or skin necrosis: No Has patient had a PCN reaction that required hospitalization No Has patient had a PCN reaction occurring within the last 10 years: No If all of the above answers are "NO", then may proceed with Cephalosporin use.   Tramadol Anaphylaxis   Acetaminophen  Other (See Comments)    Alters insulin  pump readings    Amitriptyline Other (See Comments)    Severe headache/ out of body feeling   Codeine Other (See Comments)    Severe headaches/ out of body feeling    Ibuprofen Other (See Comments)    Messes up CGM reading on glucose monitor     Krill Oil Diarrhea, Itching and Nausea And Vomiting   Lactose Intolerance (Gi) Diarrhea   Losartan Cough   Propoxyphene Other (See Comments)    Severe headaches / out of body feeling    Shellfish Allergy Diarrhea and Nausea And Vomiting   Statins Other (See Comments)    Muscle pains Other reaction(s): Other   Sulfamethoxazole Other (See Comments)    Mouth ulcers    Sulfites Itching and Other (See Comments)    Mouth ulcers   Ace Inhibitors     Other Reaction(s): Unknown   Fluconazole     Other reaction(s): Contact Dermatitis (intolerance)   Norvasc  [Amlodipine  Besylate] Swelling    Medications:   Prior to Admission medications   Medication Sig Start Date End Date Taking? Authorizing Provider  aspirin  EC 325 MG tablet Take 1 tablet (325 mg total) by mouth daily. Patient taking differently: Take 325 mg by mouth at bedtime. 03/06/19  Yes Lenise Quince, MD  b complex-vitamin c -folic acid  (NEPHRO-VITE) 0.8 MG TABS tablet Take 1 tablet by mouth daily. 04/18/23  Yes [provider]   carvedilol  (COREG ) 12.5 MG tablet TAKE 1 TABLET BY MOUTH TWICE DAILY WITH A MEAL 04/20/23  Yes Lenise Quince, MD  cetirizine (ZYRTEC) 10 MG tablet Take 10 mg by mouth at bedtime. 03/26/21  Yes [provider]  diphenoxylate -atropine  (LOMOTIL ) 2.5-0.025 MG tablet TAKE 1 TABLET BY MOUTH 4 TIMES DAILY AS NEEDED FOR DIARRHEA OR  LOOSE  STOOLS 12/24/20  Yes Ennever, Sherryll Donald, MD  esomeprazole (NEXIUM) 20 MG capsule Take 20 mg by mouth every morning.    Yes [provider]  hydrALAZINE  (APRESOLINE ) 50 MG tablet Take 1.5 tablets (75 mg total) by mouth in the morning and at bedtime. Patient taking differently: Take 50 mg by mouth 2 (two) times daily. 02/09/23  Yes Duke, Warren Haber, PA  hydrOXYzine  (ATARAX ) 10 MG tablet Take 1 tablet (10 mg total) by mouth every 6 (six) hours as needed. Patient taking differently: Take 10 mg by mouth every 6 (  six) hours as needed for itching. 03/14/23  Yes Ivor Mars, MD  isosorbide  mononitrate (IMDUR ) 60 MG 24 hr tablet Take 60 mg by mouth daily. 06/16/22  Yes [provider]  ketoconazole  (NIZORAL ) 2 % shampoo Apply 1 Application topically 2 (two) times a week.   Yes [provider]  ketorolac  (ACULAR ) 0.5 % ophthalmic solution Place 1 drop into both eyes 2 (two) times daily.   Yes [provider]  lamoTRIgine  (LAMICTAL ) 150 MG tablet Take 150 mg by mouth at bedtime. 01/25/22  Yes [provider]  Melatonin Gummies 2.5 MG CHEW Chew 2.5 mg by mouth at bedtime as needed (sleep).   Yes [provider]  mirtazapine  (REMERON ) 30 MG tablet Take 30 mg by mouth at bedtime. 10/15/21  Yes [provider]  Multiple Vitamins-Minerals (MULTIVITAMIN WITH MINERALS) tablet Take 1 tablet by mouth daily. + Zinc   Yes [provider]  nitroGLYCERIN  (NITROLINGUAL ) 0.4 MG/SPRAY spray Place 1 spray under the tongue every 5 (five) minutes x 3 doses as needed. 02/09/23  Yes Duke, Warren Haber, PA  NOVOLOG  100  UNIT/ML injection Inject 25 Units into the skin continuous. Insulin  pump 02/21/22  Yes [provider]  OVER THE COUNTER MEDICATION Take 1 capsule by mouth daily. Conjugated linoleic acid   Yes [provider]  OVER THE COUNTER MEDICATION Take 1 tablet by mouth daily. Magnesium  Threonate   Yes [provider]  prednisoLONE acetate (PRED FORTE) 1 % ophthalmic suspension Place 1 drop into both eyes 2 (two) times daily.   Yes [provider]  PREMARIN  vaginal cream Place 1 applicator vaginally daily as needed (irritation). 03/24/21  Yes [provider]  thyroid  (ARMOUR THYROID ) 60 MG tablet Take 60 mg by mouth daily before breakfast. 07/30/21  Yes [provider]  torsemide  ([name] ) 20 MG tablet Take 2 tablets (40 mg total) by mouth daily. Patient taking differently: Take 40 mg by mouth 2 (two) times daily. 10/10/22  Yes Conte, Tessa N, PA-C  valACYclovir  (VALTREX ) 500 MG tablet Take 500 mg by mouth daily as needed (Flair up).   Yes [provider]  ACCU-CHEK GUIDE test strip 3 (three) times daily. 05/03/21   [provider]  ALPHA-D-GALACTOSIDASE PO Take 600 mg by mouth daily.    [provider]  Continuous Glucose Sensor (DEXCOM G7 SENSOR) MISC Inject 1 each into the skin as directed. Every 10 days change it. 11/24/22   [provider]  Darbepoetin Alfa -Albumin (ARANESP  IJ) Inject as directed.    [provider]  Doxercalciferol (HECTOROL IV) 2 mcg. 04/20/23 04/18/24  [provider]  Methoxy PEG-Epoetin  Beta (MIRCERA IJ) 75 mcg. 05/09/23 05/07/24  [provider]    Discontinued Meds:   Medications Discontinued During This Encounter  Medication Reason   MAGNESIUM  GLUCONATE PO Patient Preference    Social History:  reports that she quit smoking about 40 years ago. Her smoking use included cigarettes. She started smoking about 50 years ago. She has a 10 pack-year smoking history. She has  never used smokeless tobacco. She reports current alcohol use. She reports that she does not use drugs.  Family History:   Family History  Problem Relation Age of Onset   Breast cancer Mother    Migraines Mother    Heart disease Father    Hypertension Father    Breast cancer Sister     Blood pressure (!) 193/143, pulse 66, resp. rate 12, SpO2 100%. GEN: NAD, A&Ox3, NCAT HEENT:  No conjunctival pallor, EOMI NECK: Supple, no thyromegaly LUNGS: CTA B/L no rales, rhonchi or wheezing CV: RRR, No M/R/G ABD: SNDNT +BS  EXT: No lower extremity edema ACCESS: RIJ tunnel catheter       Patrick Boor, MD 06/01/2023, 8:13 AM

## 2023-06-02 ENCOUNTER — Encounter (HOSPITAL_COMMUNITY): Payer: Self-pay | Admitting: Nephrology

## 2023-07-10 ENCOUNTER — Inpatient Hospital Stay

## 2023-07-10 ENCOUNTER — Inpatient Hospital Stay: Admitting: Hematology & Oncology

## 2023-07-17 ENCOUNTER — Encounter: Payer: Self-pay | Admitting: Hematology & Oncology

## 2023-07-17 ENCOUNTER — Inpatient Hospital Stay

## 2023-07-17 ENCOUNTER — Inpatient Hospital Stay (HOSPITAL_BASED_OUTPATIENT_CLINIC_OR_DEPARTMENT_OTHER): Admitting: Hematology & Oncology

## 2023-07-17 ENCOUNTER — Inpatient Hospital Stay: Attending: Hematology & Oncology

## 2023-07-17 VITALS — BP 137/74 | HR 65 | Temp 98.6°F | Resp 18

## 2023-07-17 DIAGNOSIS — N186 End stage renal disease: Secondary | ICD-10-CM | POA: Insufficient documentation

## 2023-07-17 DIAGNOSIS — E1059 Type 1 diabetes mellitus with other circulatory complications: Secondary | ICD-10-CM

## 2023-07-17 DIAGNOSIS — Z992 Dependence on renal dialysis: Secondary | ICD-10-CM | POA: Insufficient documentation

## 2023-07-17 DIAGNOSIS — E1022 Type 1 diabetes mellitus with diabetic chronic kidney disease: Secondary | ICD-10-CM | POA: Insufficient documentation

## 2023-07-17 DIAGNOSIS — Z794 Long term (current) use of insulin: Secondary | ICD-10-CM | POA: Insufficient documentation

## 2023-07-17 DIAGNOSIS — Z79899 Other long term (current) drug therapy: Secondary | ICD-10-CM | POA: Insufficient documentation

## 2023-07-17 DIAGNOSIS — D631 Anemia in chronic kidney disease: Secondary | ICD-10-CM | POA: Diagnosis present

## 2023-07-17 DIAGNOSIS — D638 Anemia in other chronic diseases classified elsewhere: Secondary | ICD-10-CM

## 2023-07-17 DIAGNOSIS — Z8673 Personal history of transient ischemic attack (TIA), and cerebral infarction without residual deficits: Secondary | ICD-10-CM | POA: Insufficient documentation

## 2023-07-17 DIAGNOSIS — I2510 Atherosclerotic heart disease of native coronary artery without angina pectoris: Secondary | ICD-10-CM

## 2023-07-17 DIAGNOSIS — N1831 Chronic kidney disease, stage 3a: Secondary | ICD-10-CM

## 2023-07-17 DIAGNOSIS — D508 Other iron deficiency anemias: Secondary | ICD-10-CM | POA: Diagnosis not present

## 2023-07-17 DIAGNOSIS — I120 Hypertensive chronic kidney disease with stage 5 chronic kidney disease or end stage renal disease: Secondary | ICD-10-CM | POA: Diagnosis present

## 2023-07-17 LAB — CBC WITH DIFFERENTIAL (CANCER CENTER ONLY)
Abs Immature Granulocytes: 0.02 10*3/uL (ref 0.00–0.07)
Basophils Absolute: 0.1 10*3/uL (ref 0.0–0.1)
Basophils Relative: 1 %
Eosinophils Absolute: 0.7 10*3/uL — ABNORMAL HIGH (ref 0.0–0.5)
Eosinophils Relative: 10 %
HCT: 31.1 % — ABNORMAL LOW (ref 36.0–46.0)
Hemoglobin: 10.5 g/dL — ABNORMAL LOW (ref 12.0–15.0)
Immature Granulocytes: 0 %
Lymphocytes Relative: 27 %
Lymphs Abs: 1.8 10*3/uL (ref 0.7–4.0)
MCH: 29.4 pg (ref 26.0–34.0)
MCHC: 33.8 g/dL (ref 30.0–36.0)
MCV: 87.1 fL (ref 80.0–100.0)
Monocytes Absolute: 0.6 10*3/uL (ref 0.1–1.0)
Monocytes Relative: 9 %
Neutro Abs: 3.4 10*3/uL (ref 1.7–7.7)
Neutrophils Relative %: 53 %
Platelet Count: 301 10*3/uL (ref 150–400)
RBC: 3.57 MIL/uL — ABNORMAL LOW (ref 3.87–5.11)
RDW: 13.2 % (ref 11.5–15.5)
WBC Count: 6.6 10*3/uL (ref 4.0–10.5)
nRBC: 0 % (ref 0.0–0.2)

## 2023-07-17 LAB — CMP (CANCER CENTER ONLY)
ALT: 23 U/L (ref 0–44)
AST: 21 U/L (ref 15–41)
Albumin: 4.2 g/dL (ref 3.5–5.0)
Alkaline Phosphatase: 115 U/L (ref 38–126)
Anion gap: 14 (ref 5–15)
BUN: 77 mg/dL — ABNORMAL HIGH (ref 8–23)
CO2: 23 mmol/L (ref 22–32)
Calcium: 9.1 mg/dL (ref 8.9–10.3)
Chloride: 98 mmol/L (ref 98–111)
Creatinine: 3.77 mg/dL — ABNORMAL HIGH (ref 0.44–1.00)
GFR, Estimated: 12 mL/min — ABNORMAL LOW (ref >60)
Glucose, Bld: 156 mg/dL — ABNORMAL HIGH (ref 70–99)
Potassium: 4.5 mmol/L (ref 3.5–5.1)
Sodium: 135 mmol/L (ref 135–145)
Total Bilirubin: 0.4 mg/dL (ref 0.0–1.2)
Total Protein: 6.2 g/dL — ABNORMAL LOW (ref 6.5–8.1)

## 2023-07-17 LAB — RETICULOCYTES
Immature Retic Fract: 1.1 % — ABNORMAL LOW (ref 2.3–15.9)
RBC.: 3.64 MIL/uL — ABNORMAL LOW (ref 3.87–5.11)
Retic Count, Absolute: 20.7 10*3/uL (ref 19.0–186.0)
Retic Ct Pct: 0.6 % (ref 0.4–3.1)

## 2023-07-17 LAB — FERRITIN: Ferritin: 727 ng/mL — ABNORMAL HIGH (ref 11–307)

## 2023-07-17 MED ORDER — DARBEPOETIN ALFA 300 MCG/0.6ML IJ SOSY
300.0000 ug | PREFILLED_SYRINGE | Freq: Once | INTRAMUSCULAR | Status: AC
  Administered 2023-07-17: 300 ug via SUBCUTANEOUS
  Filled 2023-07-17: qty 0.6

## 2023-07-17 NOTE — Progress Notes (Signed)
 Hematology and Oncology Follow Up Visit  Brittney Pace [id number] 27-Jan-1955 68 y.o. 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  Brittney Pace seems to be doing pretty well.  Brittney Pace is on dialysis.  Brittney Pace is not too keen on dialysis..  Brittney Pace also has hydrocephalus.  It sounds like Brittney Pace is going need to have a VP shunt.  Brittney Pace sees Neurosurgery for this.  Brittney Pace is not sure when this would be done.  Brittney Pace is diabetic.  Brittney Pace is on insulin  pump.  Her blood sugars are under decent control.  Brittney Pace has had no problems with fever.  Brittney Pace has had no problems with her bowels or bladder.  Brittney Pace has had no rashes.  There is been no leg swelling.  Her last iron studies showed a ferritin of 715 with an iron saturation of 36%.  Her appetite is doing all right.  Brittney Pace has had no issues with nausea or vomiting.  Brittney Pace has had no bleeding.  Overall, I would have to say that her performance status is probably ECOG 2.    Wt Readings from Last 3 Encounters:  05/29/23 106 lb (48.1 kg)  05/02/23 111 lb (50.3 kg)  04/26/23 108 lb (49 kg)    Medications:  Allergies as of 07/17/2023       Reactions   Morphine  Anaphylaxis, Shortness Of Breath   Penicillins Hives   Tolerated ANCEF  on 04/30/20 Has patient had a PCN reaction causing immediate rash, facial/tongue/throat swelling, SOB or lightheadedness with hypotension: Yes Has patient had a PCN reaction causing severe rash involving mucus membranes or skin necrosis: No Has patient had a PCN reaction that required hospitalization No Has patient had a PCN reaction occurring within the last 10 years: No If all of the above answers are NO, then may proceed with Cephalosporin use.   Tramadol Anaphylaxis   Acetaminophen  Other (See Comments)   Alters insulin  pump  readings   Amitriptyline Other (See Comments)   Severe headache/ out of body feeling   Codeine Other (See Comments)   Severe headaches/ out of body feeling   Ibuprofen Other (See Comments)   Messes up CGM reading on glucose monitor   Krill Oil Diarrhea, Itching, Nausea And Vomiting   Lactose Intolerance (gi) Diarrhea   Losartan Cough   Propoxyphene Other (See Comments)   Severe headaches / out of body feeling   Shellfish Allergy Diarrhea, Nausea And Vomiting   Statins Other (See Comments)   Muscle pains Other reaction(s): Other   Sulfamethoxazole Other (See Comments)   Mouth ulcers   Sulfites Itching, Other (See Comments)   Mouth ulcers   Ace Inhibitors Other (See Comments)   Other Reaction(s): Unknown   Fluconazole    Other reaction(s): Contact Dermatitis (intolerance)   Norvasc  [amlodipine  Besylate] Swelling        Medication List        Accurate as of July 17, 2023  4:05 PM. If you have any questions, ask your nurse or doctor.          Accu-Chek Guide test strip Generic drug: glucose blood 3 (three) times daily.   ALPHA-D-GALACTOSIDASE PO Take 600 mg by mouth daily.   ARANESP  IJ Inject as directed.   Armour Thyroid  60 MG tablet Generic drug:  thyroid  Take 60 mg by mouth daily before breakfast.   aspirin  EC 325 MG tablet Take 1 tablet (325 mg total) by mouth daily.   b complex-vitamin c -folic acid  0.8 MG Tabs tablet Take 1 tablet by mouth daily.   carvedilol  12.5 MG tablet Commonly known as: COREG  TAKE 1 TABLET BY MOUTH TWICE DAILY WITH A MEAL   cetirizine 10 MG tablet Commonly known as: ZYRTEC Take 10 mg by mouth at bedtime.   Dexcom G7 Sensor Misc Inject 1 each into the skin as directed. Every 10 days change it.   diphenoxylate -atropine  2.5-0.025 MG tablet Commonly known as: LOMOTIL  TAKE 1 TABLET BY MOUTH 4 TIMES DAILY AS NEEDED FOR DIARRHEA OR  LOOSE  STOOLS   esomeprazole 20 MG capsule Commonly known as: NEXIUM Take 20 mg by mouth every  morning.   HECTOROL IV 2 mcg.   hydrALAZINE  50 MG tablet Commonly known as: APRESOLINE  Take 1.5 tablets (75 mg total) by mouth in the morning and at bedtime.   hydrOXYzine  10 MG tablet Commonly known as: ATARAX  Take 1 tablet (10 mg total) by mouth every 6 (six) hours as needed.   isosorbide  mononitrate 60 MG 24 hr tablet Commonly known as: IMDUR  Take 60 mg by mouth daily.   ketoconazole  2 % shampoo Commonly known as: NIZORAL  Apply 1 Application topically 2 (two) times a week.   ketorolac  0.5 % ophthalmic solution Commonly known as: ACULAR  Place 1 drop into both eyes 2 (two) times daily.   lamoTRIgine  150 MG tablet Commonly known as: LAMICTAL  Take 150 mg by mouth at bedtime.   Melatonin Gummies 2.5 MG Chew Chew 2.5 mg by mouth at bedtime as needed (sleep).   MIRCERA IJ 75 mcg.   mirtazapine  30 MG tablet Commonly known as: REMERON  Take 30 mg by mouth at bedtime.   multivitamin with minerals tablet Take 1 tablet by mouth daily. + Zinc   nitroGLYCERIN  0.4 MG/SPRAY spray Commonly known as: NITROLINGUAL  Place 1 spray under the tongue every 5 (five) minutes x 3 doses as needed.   NovoLOG  100 UNIT/ML injection Generic drug: insulin  aspart Inject 25 Units into the skin continuous. Insulin  pump   OVER THE COUNTER MEDICATION Take 1 capsule by mouth daily. Conjugated linoleic acid   OVER THE COUNTER MEDICATION Take 1 tablet by mouth daily. Magnesium  Threonate   prednisoLONE acetate 1 % ophthalmic suspension Commonly known as: PRED FORTE Place 1 drop into both eyes 2 (two) times daily.   Premarin  vaginal cream Generic drug: conjugated estrogens  Place 1 applicator vaginally daily as needed (irritation).   torsemide  20 MG tablet Commonly known as: [name]  Take 2 tablets (40 mg total) by mouth daily.   valACYclovir  500 MG tablet Commonly known as: VALTREX  Take 500 mg by mouth daily as needed (Flair up).        Allergies:  Allergies  Allergen Reactions    Morphine  Anaphylaxis and Shortness Of Breath   Penicillins Hives    Tolerated ANCEF  on 04/30/20 Has patient had a PCN reaction causing immediate rash, facial/tongue/throat swelling, SOB or lightheadedness with hypotension: Yes Has patient had a PCN reaction causing severe rash involving mucus membranes or skin necrosis: No Has patient had a PCN reaction that required hospitalization No Has patient had a PCN reaction occurring within the last 10 years: No If all of the above answers are NO, then may proceed with Cephalosporin use.   Tramadol Anaphylaxis   Acetaminophen  Other (See Comments)    Alters insulin  pump readings  Amitriptyline Other (See Comments)    Severe headache/ out of body feeling   Codeine Other (See Comments)    Severe headaches/ out of body feeling    Ibuprofen Other (See Comments)    Messes up CGM reading on glucose monitor     Krill Oil Diarrhea, Itching and Nausea And Vomiting   Lactose Intolerance (Gi) Diarrhea   Losartan Cough   Propoxyphene Other (See Comments)    Severe headaches / out of body feeling    Shellfish Allergy Diarrhea and Nausea And Vomiting   Statins Other (See Comments)    Muscle pains Other reaction(s): Other   Sulfamethoxazole Other (See Comments)    Mouth ulcers    Sulfites Itching and Other (See Comments)    Mouth ulcers   Ace Inhibitors Other (See Comments)    Other Reaction(s): Unknown   Fluconazole     Other reaction(s): Contact Dermatitis (intolerance)   Norvasc  [Amlodipine  Besylate] Swelling    Past Medical History, Surgical history, Social history, and Family History were reviewed and updated.  Review of Systems: Review of Systems  Constitutional:  Positive for malaise/fatigue. Negative for chills and fever.  HENT: Negative.    Eyes:  Positive for blurred vision.  Respiratory:  Positive for shortness of breath (mild).   Cardiovascular:  Negative for leg swelling.  Gastrointestinal:  Negative for abdominal pain and  diarrhea.  Genitourinary:  Negative for dysuria, flank pain, frequency, hematuria and urgency.  Musculoskeletal:  Positive for joint pain and myalgias.  Skin:  Negative for rash.  Neurological:  Positive for headaches.  Endo/Heme/Allergies: Negative.   Psychiatric/Behavioral: Negative.     Wt Readings from Last 3 Encounters:  05/29/23 106 lb (48.1 kg)  05/02/23 111 lb (50.3 kg)  04/26/23 108 lb (49 kg)   Vital signs show a temperature 98.6.  Pulse 65.  Blood pressure 137/74.  Weight was not taken.    Physical Exam Vitals reviewed.  Constitutional:      Comments: Gentle use of cane for ambulation   HENT:     Head: Normocephalic and atraumatic.   Eyes:     Pupils: Pupils are equal, round, and reactive to light.    Cardiovascular:     Rate and Rhythm: Normal rate and regular rhythm.     Heart sounds: Normal heart sounds.  Pulmonary:     Effort: Pulmonary effort is normal.     Breath sounds: Normal breath sounds.   Musculoskeletal:     Cervical back: Normal range of motion.     Right lower leg: No edema.     Left lower leg: No edema.  Lymphadenopathy:     Cervical: No cervical adenopathy.   Skin:    General: Skin is warm and dry.     Findings: No erythema or rash.   Neurological:     Mental Status: Brittney Pace is alert and oriented to person, place, and time. Mental status is at baseline.     Comments: Cranial nerves are intact  Ambulating slowly with cane  Psychiatric:        Behavior: Behavior normal.        Thought Content: Thought content normal.        Judgment: Judgment normal.    Lab Results  Component Value Date   WBC 6.6 07/17/2023   HGB 10.5 (L) 07/17/2023   HCT 31.1 (L) 07/17/2023   MCV 87.1 07/17/2023   PLT 301 07/17/2023   Lab Results  Component Value Date   FERRITIN 715 (  H) 05/29/2023   IRON 93 05/29/2023   TIBC 256 05/29/2023   UIBC 163 05/29/2023   IRONPCTSAT 36 (H) 05/29/2023   Lab Results  Component Value Date   RETICCTPCT 0.6 07/17/2023    RBC 3.57 (L) 07/17/2023   RBC 3.64 (L) 07/17/2023   No results found for: KPAFRELGTCHN, LAMBDASER, KAPLAMBRATIO No results found for: IGGSERUM, IGA, IGMSERUM No results found for: STEPHANY CARLOTA BENSON MARKEL EARLA JOANNIE DOC VICK, SPEI   Chemistry      Component Value Date/Time   NA 135 07/17/2023 1505   NA 138 07/17/2017 1128   K 4.5 07/17/2023 1505   CL 98 07/17/2023 1505   CO2 23 07/17/2023 1505   BUN 77 (H) 07/17/2023 1505   BUN 23 07/17/2017 1128   CREATININE 3.77 (H) 07/17/2023 1505   CREATININE 2.45 (H) 03/03/2022 1129      Component Value Date/Time   CALCIUM  9.1 07/17/2023 1505   CALCIUM  8.7 02/06/2023 0000   ALKPHOS 115 07/17/2023 1505   AST 21 07/17/2023 1505   ALT 23 07/17/2023 1505   BILITOT 0.4 07/17/2023 1505      Impression and Plan: Brittney Pace is a very pleasant 68 yo female with multifactorial anemia. Brittney Pace has CKD, HTN, DM1.   Brittney Pace will get Aranesp  today.  Again, Brittney Pace is on dialysis.  Brittney Pace has diabetes.  We will do our best to try to get her hemoglobin above 11.  Hopefully, this VP SHUNT can be done.  I know that Neurosurgery is managing this.  For right now, we will plan to get her back to see us  in another month or so.   Maude JONELLE Crease, MD 6/23/20254:05 PM

## 2023-07-17 NOTE — Patient Instructions (Signed)

## 2023-07-18 ENCOUNTER — Telehealth: Payer: Self-pay

## 2023-07-18 LAB — IRON AND IRON BINDING CAPACITY (CC-WL,HP ONLY)
Iron: 101 ug/dL (ref 28–170)
Saturation Ratios: 38 % — ABNORMAL HIGH (ref 10.4–31.8)
TIBC: 265 ug/dL (ref 250–450)
UIBC: 164 ug/dL (ref 148–442)

## 2023-07-18 NOTE — Telephone Encounter (Signed)
 Pt called inquiring about her Ferritin levels being so high. Lauraine Franchot PIETY looked over patients labs and said pt ferritin could be elevated due to inflammation, and being on dialysis this was not abnormal. We will keep a close eye on levels and recheck at next appointment. Called and LM with patient.

## 2023-07-19 ENCOUNTER — Telehealth: Payer: Self-pay

## 2023-07-19 NOTE — Telephone Encounter (Signed)
 Patient has not called office to schedule PD catheter placement with Dr. Magda.  Dr. Magda requests that she see her nephrologist prior to setting up surgery date.  Letter sent.

## 2023-08-02 ENCOUNTER — Other Ambulatory Visit: Payer: Self-pay | Admitting: Hematology & Oncology

## 2023-08-03 ENCOUNTER — Encounter: Payer: Self-pay | Admitting: Family

## 2023-08-16 ENCOUNTER — Inpatient Hospital Stay (HOSPITAL_BASED_OUTPATIENT_CLINIC_OR_DEPARTMENT_OTHER): Admitting: Hematology & Oncology

## 2023-08-16 ENCOUNTER — Inpatient Hospital Stay

## 2023-08-16 ENCOUNTER — Encounter: Payer: Self-pay | Admitting: Hematology & Oncology

## 2023-08-16 ENCOUNTER — Inpatient Hospital Stay: Attending: Hematology & Oncology

## 2023-08-16 VITALS — BP 149/68 | HR 58 | Temp 97.9°F | Resp 24 | Ht 59.0 in | Wt 105.1 lb

## 2023-08-16 DIAGNOSIS — N186 End stage renal disease: Secondary | ICD-10-CM | POA: Insufficient documentation

## 2023-08-16 DIAGNOSIS — E1022 Type 1 diabetes mellitus with diabetic chronic kidney disease: Secondary | ICD-10-CM | POA: Diagnosis not present

## 2023-08-16 DIAGNOSIS — Z79899 Other long term (current) drug therapy: Secondary | ICD-10-CM | POA: Diagnosis not present

## 2023-08-16 DIAGNOSIS — I120 Hypertensive chronic kidney disease with stage 5 chronic kidney disease or end stage renal disease: Secondary | ICD-10-CM | POA: Insufficient documentation

## 2023-08-16 DIAGNOSIS — D631 Anemia in chronic kidney disease: Secondary | ICD-10-CM | POA: Diagnosis not present

## 2023-08-16 DIAGNOSIS — Z992 Dependence on renal dialysis: Secondary | ICD-10-CM | POA: Insufficient documentation

## 2023-08-16 DIAGNOSIS — Z8673 Personal history of transient ischemic attack (TIA), and cerebral infarction without residual deficits: Secondary | ICD-10-CM | POA: Insufficient documentation

## 2023-08-16 DIAGNOSIS — D508 Other iron deficiency anemias: Secondary | ICD-10-CM | POA: Diagnosis not present

## 2023-08-16 DIAGNOSIS — D638 Anemia in other chronic diseases classified elsewhere: Secondary | ICD-10-CM

## 2023-08-16 DIAGNOSIS — N1831 Chronic kidney disease, stage 3a: Secondary | ICD-10-CM

## 2023-08-16 LAB — CBC WITH DIFFERENTIAL (CANCER CENTER ONLY)
Abs Immature Granulocytes: 0.04 K/uL (ref 0.00–0.07)
Basophils Absolute: 0.1 K/uL (ref 0.0–0.1)
Basophils Relative: 1 %
Eosinophils Absolute: 0.6 K/uL — ABNORMAL HIGH (ref 0.0–0.5)
Eosinophils Relative: 7 %
HCT: 32.6 % — ABNORMAL LOW (ref 36.0–46.0)
Hemoglobin: 10.8 g/dL — ABNORMAL LOW (ref 12.0–15.0)
Immature Granulocytes: 0 %
Lymphocytes Relative: 13 %
Lymphs Abs: 1.2 K/uL (ref 0.7–4.0)
MCH: 30.2 pg (ref 26.0–34.0)
MCHC: 33.1 g/dL (ref 30.0–36.0)
MCV: 91.1 fL (ref 80.0–100.0)
Monocytes Absolute: 0.5 K/uL (ref 0.1–1.0)
Monocytes Relative: 6 %
Neutro Abs: 6.5 K/uL (ref 1.7–7.7)
Neutrophils Relative %: 73 %
Platelet Count: 278 K/uL (ref 150–400)
RBC: 3.58 MIL/uL — ABNORMAL LOW (ref 3.87–5.11)
RDW: 15.9 % — ABNORMAL HIGH (ref 11.5–15.5)
WBC Count: 9 K/uL (ref 4.0–10.5)
nRBC: 0 % (ref 0.0–0.2)

## 2023-08-16 LAB — CMP (CANCER CENTER ONLY)
ALT: 16 U/L (ref 0–44)
AST: 30 U/L (ref 15–41)
Albumin: 4.4 g/dL (ref 3.5–5.0)
Alkaline Phosphatase: 122 U/L (ref 38–126)
Anion gap: 14 (ref 5–15)
BUN: 38 mg/dL — ABNORMAL HIGH (ref 8–23)
CO2: 30 mmol/L (ref 22–32)
Calcium: 9.6 mg/dL (ref 8.9–10.3)
Chloride: 96 mmol/L — ABNORMAL LOW (ref 98–111)
Creatinine: 2.51 mg/dL — ABNORMAL HIGH (ref 0.44–1.00)
GFR, Estimated: 20 mL/min — ABNORMAL LOW (ref >60)
Glucose, Bld: 156 mg/dL — ABNORMAL HIGH (ref 70–99)
Potassium: 4.5 mmol/L (ref 3.5–5.1)
Sodium: 140 mmol/L (ref 135–145)
Total Bilirubin: 0.4 mg/dL (ref 0.0–1.2)
Total Protein: 6.6 g/dL (ref 6.5–8.1)

## 2023-08-16 LAB — RETICULOCYTES
Immature Retic Fract: 7.4 % (ref 2.3–15.9)
RBC.: 3.53 MIL/uL — ABNORMAL LOW (ref 3.87–5.11)
Retic Count, Absolute: 31.1 K/uL (ref 19.0–186.0)
Retic Ct Pct: 0.9 % (ref 0.4–3.1)

## 2023-08-16 LAB — IRON AND IRON BINDING CAPACITY (CC-WL,HP ONLY)
Iron: 97 ug/dL (ref 28–170)
Saturation Ratios: 33 % — ABNORMAL HIGH (ref 10.4–31.8)
TIBC: 295 ug/dL (ref 250–450)
UIBC: 198 ug/dL

## 2023-08-16 LAB — FERRITIN: Ferritin: 560 ng/mL — ABNORMAL HIGH (ref 11–307)

## 2023-08-16 MED ORDER — DARBEPOETIN ALFA 300 MCG/0.6ML IJ SOSY
300.0000 ug | PREFILLED_SYRINGE | Freq: Once | INTRAMUSCULAR | Status: AC
  Administered 2023-08-16: 300 ug via SUBCUTANEOUS
  Filled 2023-08-16: qty 0.6

## 2023-08-16 NOTE — Patient Instructions (Signed)

## 2023-08-16 NOTE — Progress Notes (Signed)
 Hematology and Oncology Follow Up Visit  Brittney Pace [id number] October 22, 1955 68 y.o. 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 is here today for follow-up.  She is not too happy about having to do hemodialysis.  She apparently has some issues last time that she was there.  She is hopefully going to have peritoneal dialysis.  She has seen the vascular surgery.  Nothing has been set up yet.  She has seen Dr. Malcolm of Neurosurgery.  He wants to put in a VP shunt.  However, what he puts and will be dependent upon her dialysis mode.  She is dizzy.  She does feel little bit lightheaded.  Is concerning be from the fluid on the brain.  She has not bleeding.  Thankfully, the Aranesp  is helping her.  Her last iron studies that were done in June showed a ferritin of 727 with an iron saturation of 38%.     Her blood sugars have been fluctuating a little bit.  She does see her Endocrinologist.  I think she has insulin  pump to try to help monitor her and maintain her blood sugars..  She has had some bowel issues.  This is more chronic than anything else.  She has had no leg swelling.  However, when she does not get her dialysis, the leg swelling does worsen somewhat.  Overall, I would say that her performance status right now is probably ECOG 2.     Wt Readings from Last 3 Encounters:  05/29/23 106 lb (48.1 kg)  05/02/23 111 lb (50.3 kg)  04/26/23 108 lb (49 kg)    Medications:  Allergies as of 08/16/2023       Reactions   Morphine  Anaphylaxis, Shortness Of Breath   Penicillins Hives   Tolerated ANCEF  on 04/30/20 Has patient had a PCN reaction causing immediate rash, facial/tongue/throat swelling, SOB or lightheadedness with hypotension: Yes Has patient had a PCN reaction causing severe rash  involving mucus membranes or skin necrosis: No Has patient had a PCN reaction that required hospitalization No Has patient had a PCN reaction occurring within the last 10 years: No If all of the above answers are NO, then may proceed with Cephalosporin use.   Tramadol Anaphylaxis   Acetaminophen  Other (See Comments)   Alters insulin  pump readings   Amitriptyline Other (See Comments)   Severe headache/ out of body feeling   Codeine Other (See Comments)   Severe headaches/ out of body feeling   Ibuprofen Other (See Comments)   Messes up CGM reading on glucose monitor   Krill Oil Diarrhea, Itching, Nausea And Vomiting   Lactose Intolerance (gi) Diarrhea   Losartan Cough   Propoxyphene Other (See Comments)   Severe headaches / out of body feeling   Shellfish Allergy Diarrhea, Nausea And Vomiting   Statins Other (See Comments)   Muscle pains Other reaction(s): Other   Sulfamethoxazole Other (See Comments)   Mouth ulcers   Sulfites Itching, Other (See Comments)   Mouth ulcers   Ace Inhibitors Other (See Comments)   Other Reaction(s): Unknown   Fluconazole    Other reaction(s): Contact Dermatitis (intolerance)   Norvasc  [amlodipine  Besylate] Swelling        Medication List        Accurate as of August 16, 2023  1:37 PM. If you have any questions, ask your nurse or doctor.          STOP taking these medications    ALPHA-D-GALACTOSIDASE PO Stopped by: Maude JONELLE Crease   hydrALAZINE  50 MG tablet Commonly known as: APRESOLINE  Stopped by: Maude JONELLE Crease   isosorbide  mononitrate 60 MG 24 hr tablet Commonly known as: IMDUR  Stopped by: Day Deery R Kourtni Stineman   lamoTRIgine  150 MG tablet Commonly known as: LAMICTAL  Stopped by: Maude JONELLE Crease       TAKE these medications    Accu-Chek Guide test strip Generic drug: glucose blood 3 (three) times daily.   ARANESP  IJ Inject as directed.   Armour Thyroid  60 MG tablet Generic drug: thyroid  Take 60 mg by mouth daily  before breakfast.   aspirin  EC 325 MG tablet Take 1 tablet (325 mg total) by mouth daily.   b complex-vitamin c -folic acid  0.8 MG Tabs tablet Take 1 tablet by mouth daily.   carvedilol  12.5 MG tablet Commonly known as: COREG  TAKE 1 TABLET BY MOUTH TWICE DAILY WITH A MEAL   cetirizine 10 MG tablet Commonly known as: ZYRTEC Take 10 mg by mouth at bedtime.   Dexcom G7 Sensor Misc Inject 1 each into the skin as directed. Every 10 days change it.   diphenoxylate -atropine  2.5-0.025 MG tablet Commonly known as: LOMOTIL  TAKE 1 TABLET BY MOUTH 4 TIMES DAILY AS NEEDED FOR DIARRHEA OR  LOOSE  STOOLS   esomeprazole 20 MG capsule Commonly known as: NEXIUM Take 20 mg by mouth every morning.   HECTOROL IV 2 mcg.   hydrOXYzine  10 MG tablet Commonly known as: ATARAX  TAKE 1 TABLET BY MOUTH EVERY 6 HOURS AS NEEDED   ketoconazole  2 % shampoo Commonly known as: NIZORAL  Apply 1 Application topically 2 (two) times a week.   ketorolac  0.5 % ophthalmic solution Commonly known as: ACULAR  Place 1 drop into both eyes 2 (two) times daily.   Melatonin Gummies 2.5 MG Chew Chew 2.5 mg by mouth at bedtime as needed (sleep).   MIRCERA IJ 75 mcg.   mirtazapine  30 MG tablet Commonly known as: REMERON  Take 30 mg by mouth at bedtime.   multivitamin with minerals tablet Take 1 tablet by mouth daily. + Zinc   nitroGLYCERIN  0.4 MG/SPRAY spray Commonly known as: NITROLINGUAL  Place 1 spray under the tongue every 5 (five) minutes x 3 doses as needed.   NovoLOG  100 UNIT/ML injection Generic drug: insulin  aspart Inject 25 Units into the skin continuous. Insulin  pump   OVER THE COUNTER MEDICATION Take 1 capsule by mouth daily. Conjugated linoleic acid   OVER THE COUNTER MEDICATION Take 1 tablet by mouth daily. Magnesium  Threonate   prednisoLONE acetate 1 % ophthalmic suspension Commonly known as: PRED FORTE Place 1 drop into both eyes 2 (two) times daily.   Premarin  vaginal cream Generic  drug: conjugated estrogens  Place 1 applicator vaginally daily as needed (irritation).   torsemide  20 MG tablet Commonly known as: [name]  Take 2 tablets (40 mg total) by mouth daily.   valACYclovir  500 MG tablet Commonly known as: VALTREX  Take 500 mg by mouth daily as needed (Flair up).        Allergies:  Allergies  Allergen Reactions   Morphine  Anaphylaxis and Shortness Of Breath   Penicillins Hives    Tolerated ANCEF  on 04/30/20 Has patient had a PCN reaction causing immediate rash, facial/tongue/throat swelling, SOB or lightheadedness with hypotension: Yes Has patient had a PCN reaction causing severe rash involving mucus  membranes or skin necrosis: No Has patient had a PCN reaction that required hospitalization No Has patient had a PCN reaction occurring within the last 10 years: No If all of the above answers are NO, then may proceed with Cephalosporin use.   Tramadol Anaphylaxis   Acetaminophen  Other (See Comments)    Alters insulin  pump readings    Amitriptyline Other (See Comments)    Severe headache/ out of body feeling   Codeine Other (See Comments)    Severe headaches/ out of body feeling    Ibuprofen Other (See Comments)    Messes up CGM reading on glucose monitor     Krill Oil Diarrhea, Itching and Nausea And Vomiting   Lactose Intolerance (Gi) Diarrhea   Losartan Cough   Propoxyphene Other (See Comments)    Severe headaches / out of body feeling    Shellfish Allergy Diarrhea and Nausea And Vomiting   Statins Other (See Comments)    Muscle pains Other reaction(s): Other   Sulfamethoxazole Other (See Comments)    Mouth ulcers    Sulfites Itching and Other (See Comments)    Mouth ulcers   Ace Inhibitors Other (See Comments)    Other Reaction(s): Unknown   Fluconazole     Other reaction(s): Contact Dermatitis (intolerance)   Norvasc  [Amlodipine  Besylate] Swelling    Past Medical History, Surgical history, Social history, and Family History were  reviewed and updated.  Review of Systems: Review of Systems  Constitutional:  Positive for malaise/fatigue. Negative for chills and fever.  HENT: Negative.    Eyes:  Positive for blurred vision.  Respiratory:  Positive for shortness of breath (mild).   Cardiovascular:  Negative for leg swelling.  Gastrointestinal:  Negative for abdominal pain and diarrhea.  Genitourinary:  Negative for dysuria, flank pain, frequency, hematuria and urgency.  Musculoskeletal:  Positive for joint pain and myalgias.  Skin:  Negative for rash.  Neurological:  Positive for headaches.  Endo/Heme/Allergies: Negative.   Psychiatric/Behavioral: Negative.     Wt Readings from Last 3 Encounters:  05/29/23 106 lb (48.1 kg)  05/02/23 111 lb (50.3 kg)  04/26/23 108 lb (49 kg)   Vital signs show a temperature 97.9.  Pulse 58.  Blood pressure 153/71.  Weight is 105 pounds.     Physical Exam Vitals reviewed.  Constitutional:      Comments: Gentle use of cane for ambulation   HENT:     Head: Normocephalic and atraumatic.  Eyes:     Pupils: Pupils are equal, round, and reactive to light.  Cardiovascular:     Rate and Rhythm: Normal rate and regular rhythm.     Heart sounds: Normal heart sounds.  Pulmonary:     Effort: Pulmonary effort is normal.     Breath sounds: Normal breath sounds.  Musculoskeletal:     Cervical back: Normal range of motion.     Right lower leg: No edema.     Left lower leg: No edema.  Lymphadenopathy:     Cervical: No cervical adenopathy.  Skin:    General: Skin is warm and dry.     Findings: No erythema or rash.  Neurological:     Mental Status: She is alert and oriented to person, place, and time. Mental status is at baseline.     Comments: Cranial nerves are intact  Ambulating slowly with cane  Psychiatric:        Behavior: Behavior normal.        Thought Content: Thought content normal.  Judgment: Judgment normal.     Lab Results  Component Value Date   WBC 9.0  08/16/2023   HGB 10.8 (L) 08/16/2023   HCT 32.6 (L) 08/16/2023   MCV 91.1 08/16/2023   PLT 278 08/16/2023   Lab Results  Component Value Date   FERRITIN 727 (H) 07/17/2023   IRON 101 07/17/2023   TIBC 265 07/17/2023   UIBC 164 07/17/2023   IRONPCTSAT 38 (H) 07/17/2023   Lab Results  Component Value Date   RETICCTPCT 0.6 07/17/2023   RBC 3.58 (L) 08/16/2023   No results found for: KPAFRELGTCHN, LAMBDASER, KAPLAMBRATIO No results found for: IGGSERUM, IGA, IGMSERUM No results found for: STEPHANY CARLOTA BENSON MARKEL EARLA JOANNIE DOC VICK, SPEI   Chemistry      Component Value Date/Time   NA 135 07/17/2023 1505   NA 138 07/17/2017 1128   K 4.5 07/17/2023 1505   CL 98 07/17/2023 1505   CO2 23 07/17/2023 1505   BUN 77 (H) 07/17/2023 1505   BUN 23 07/17/2017 1128   CREATININE 3.77 (H) 07/17/2023 1505   CREATININE 2.45 (H) 03/03/2022 1129      Component Value Date/Time   CALCIUM  9.1 07/17/2023 1505   CALCIUM  8.7 02/06/2023 0000   ALKPHOS 115 07/17/2023 1505   AST 21 07/17/2023 1505   ALT 23 07/17/2023 1505   BILITOT 0.4 07/17/2023 1505      Impression and Plan: Ms. Igo is a very pleasant 67 yo female with multifactorial anemia. She has CKD, HTN, DM1.   Again, she is not too happy about having to do hemodialysis still.  She really wants to do peritoneal dialysis.  Hopefully everything will come together for this..  We also had to make sure that her hydrocephalus is going to be attended to.  I know that Dr. Malcolm he had neurosurgery is very good at this and he will monitor her.  She will get her Aranesp  today.  We will go ahead and plan to get her back to see us  in another month.  Maybe, by then, there will be a game plan as to how all of these procedures will be done.   Maude JONELLE Crease, MD 7/23/20251:37 PM

## 2023-08-21 ENCOUNTER — Other Ambulatory Visit: Payer: Self-pay | Admitting: Hematology & Oncology

## 2023-08-23 ENCOUNTER — Encounter: Payer: Self-pay | Admitting: Neurology

## 2023-08-23 ENCOUNTER — Ambulatory Visit (INDEPENDENT_AMBULATORY_CARE_PROVIDER_SITE_OTHER): Payer: Medicare Other | Admitting: Neurology

## 2023-08-23 ENCOUNTER — Other Ambulatory Visit: Payer: Self-pay | Admitting: *Deleted

## 2023-08-23 VITALS — BP 134/64 | HR 68 | Ht <= 58 in | Wt 107.0 lb

## 2023-08-23 DIAGNOSIS — G912 (Idiopathic) normal pressure hydrocephalus: Secondary | ICD-10-CM

## 2023-08-23 DIAGNOSIS — G3184 Mild cognitive impairment, so stated: Secondary | ICD-10-CM

## 2023-08-23 MED ORDER — FLUOCINONIDE 0.05 % EX SOLN: 1.0000 | Freq: Every day | CUTANEOUS | 0 refills | Status: AC

## 2023-08-23 NOTE — Progress Notes (Addendum)
 [facility]    Provider:  Dr Ines Requesting Provider: Timmy Maude SAUNDERS, MD Primary Care Provider:  Timmy Maude SAUNDERS, MD  CC:  memory loss  Patient mychart message 08/24/2023 for reference: Had a severe neck injury around age 68. Had migraines, hard to stand, nausea . It was a work comp so only  MRI of neck was done. Ortho recommended Rhizometry but I refused. Had to go on disability based on peripheral neuropathy . Didn't know to seek out my own [facility].  Brain MRI 10 yrs later showed stroke in brainstem. Looking back, it was the worst of any of the brain strokes but I new I was beginning to loose brain function w/things like math/taxes/etc. Things I was very adept at before this. Luckily, I had enough presence of mind to set up auto payments/banking /etc before it got any worse. My spouse  never had mental capacity for paying bills on time(Meningitis at 10mo , alcoholic since 67, emotionally abusive parents, but a kind heart).  I'd assumed it was from T1DM. All I was told was I had 2 1/2 ruptured discs high in neck that I never treated. I didn't want an Ortho working that close to my spinal cord.  I know osteoporosis is worsening, as is spinal stenosis & cracked vertebrae mid lumbar-never repaired. I'm telling you because I want to make sure someone has the full pic of everything that may be affecting my brain. I will try to get back on using soft hyperbaric since that was very helpful w/brainfog of Lyme/Candida in the past. Thanks for your help & for listening! Brittney Pace  08/23/2023: From a review of epic, since last being seen she started dialysis and is now getting it 3 times a week, I reviewed psychiatry note where she endorsed chronic passive suicidal ideation but no plan to intent to harm herself or someone else but is struggling with significant decrease in quality of life since starting dialysis, diagnosed with major depressive disorder, recurrent episode, moderate and  unspecified neurocognitive disorder.  See how things progress and consider ab42/40 and apoe4 testing and formal neurocognitive testing in the future. She will follow up when she feels she is ready to proceed.   She had a high volume lumbar tap with Dr. Malcolm and felt improved and so she is going to get VP shunt for normal pressure hydrocephalus. She also has to get a peroteal port for at home dialysis. Life is very difficult. She is still in an unhappy marriage there is a lot of stress, there is alcohol abuse, we discussed testing for alzheimer's biomarkers and she would like to see how she does with the VP shunt as we discussed if this is NPH the VP shunt could improve memory. I agree with not testing at this time for alzheimers biomarkers, and for managing current medical conditions (started dialysis) and holding off on the formal memory testing until all of this has calmed down.   03/01/2023: reviewed mri images of the brain:  CLINICAL DATA:  Ataxia, chronic, progressive (Ped 0-17y)   EXAM: MRI HEAD WITHOUT CONTRAST   TECHNIQUE: Multiplanar, multiecho pulse sequences of the brain and surrounding structures were obtained without intravenous contrast.   COMPARISON:  Head CT 10/14/2022   FINDINGS: Brain: Negative for an acute infarct. No hemorrhage. No hydrocephalus. No extra-axial fluid collection. No mass effect. No mass lesion. There is a background of moderate chronic microvascular ischemic change. There is a mild ventricular prominence with an acute callosal  angle (70 degrees). There is no significant crowding of the sulci at the vertex. Chronic infarcts in the bilateral basal ganglia.   Vascular: Normal flow voids.   Skull and upper cervical spine: Normal marrow signal.   Sinuses/Orbits: No middle ear or mastoid effusion. Paranasal sinuses are notable for mild mucosal thickening in the bilateral maxillary sinuses. Bilateral lens replacement. Orbits are otherwise unremarkable.    Other: None.   IMPRESSION: 1. No acute intracranial process. 2. Mild ventriculomegaly with an acute callosal angle (70 degrees). No significant crowding of the sulci at the vertex. Although nonspecific, findings can be seen in the setting of NPH. Reviewed notes, labs and imaging from outside physicians, which showed:   Patient complains of symptoms per HPI as well as the following symptoms: per hpi . Pertinent negatives and positives per HPI. All others negative   03/01/2023: Patient is is here for memory loss. Complicated past medical hvery complicated right now. 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 associated with type 2 diabetes mellitus (HCC); DKA, type 1 (HCC); Vitreous hemorrhage of left eye (HCC); Vitreous syneresis of both eyes; Combined form of age-related cataract, left eye; Lacunar stroke (HCC); Memory loss; Depression; Brain atrophy (HCC); Chronic headache; CKD stage 3 due to type 1 diabetes mellitus (HCC); Hypertension associated with diabetes (HCC); Acquired hypothyroidism; Arthritis; Asthma; Carpal tunnel syndrome; Cervical spondylosis with radiculopathy; Closed displaced intra-articular fracture of right calcaneus; Corneal epithelial basement membrane dystrophy; Dizziness; Gastroesophageal reflux disease; Pseudophakia, right eye; Seasonal affective disorder (HCC); Sepsis with metabolic  encephalopathy (HCC); Small intestinal bacterial overgrowth; Tinnitus, right; Spondylolisthesis of cervical region; Erythropoietin  deficiency anemia; DNR (do not resuscitate); Iron deficiency anemia; History of multiple cerebrovascular accidents (CVAs); Herpes genitalis; History of hyperbaric oxygen therapy; OAB (overactive bladder); Recurrent UTI; Vaginal atrophy; Chronic migraine without aura, with intractable migraine, so stated, with status migrainosus; Urinary frequency; Generalized weakness; Transaminitis; Hyponatremia; Hyperkalemia; DKA (diabetic ketoacidosis) (HCC); Acute kidney injury superimposed on chronic kidney disease (HCC); HTN (hypertension); Acute on chronic diastolic CHF (congestive heart failure) (HCC); CKD (chronic kidney disease) stage 4, GFR 15-29 ml/min (HCC); Fibromyalgia; Hypothyroidism; Closed fracture of olecranon process of ulna; Mild cognitive impairment; Dependence on renal dialysis (HCC); Diabetic gastroparesis (HCC); End stage renal disease (HCC); Fracture of humerus; History of babesiosis; History of Lyme disease; Hypertensive heart and chronic kidney disease with heart failure and stage 1 through stage 4 chronic kidney disease, or unspecified chronic kidney disease (HCC); Itching; Metatarsalgia of right foot; Pain in right foot; Mild protein-calorie malnutrition (HCC); Orthostatic hypotension; Pain in left elbow; Pain of left hip joint; Personal history of nicotine dependence; Physical deconditioning; Primary insomnia; Secondary hyperparathyroidism of renal origin (HCC); Sleep disorder; Type 1 diabetes mellitus with hypoglycemia (HCC); Unspecified sequelae of cerebral infarction; Vitamin D  deficiency; and NPH (normal pressure hydrocephalus) (HCC) on their problem list. MMSE 30/30. On 05/04/2022 Geriatrics mentioned Mild Dementia. She is here for memory changes. She is hee with ehr husband. She states she has been feeling memory changes since her 66s. She used to do the taxes. They  moved  here in 2014. Husband states she is withdrawing, that is his major problem. He is concerned about her not being social for 10 years since th pandemic. He feels its because of medical problems, low immunity specifically she mentions the type 1 diabetes, she states her immunity is terrible after getting lyme. Mort short term memory loss, she forgets where things are, he noticed a decline memory about 5 years ago and progressive, she will ask the same things over even within the hours, she writes her appointment down in a book and she compensates with that, not getting lost, no problems with visuospatial, she used to take care of all the bills and husband took over. Her cooking ability is gone, patient states she doesn't have the energy, husband says her temper is very short.  No FHx of dementia or alzheimer's. She has incontinence she has had a complete hysterectomy at age 47.    B12 354 12/14/2022 nml TSH 09/29/2022 1.178 nml HgbA1c 9.3 08/04/2022  Patient complains of symptoms per HPI as well as the following symptoms: depression,anxiety,chronic pain . Pertinent negatives and positives per HPI. All others negative  HPI 04/01/2020:  Brylie Sneath is a 68 y.o. female here as requested by Timmy Maude SAUNDERS, MD for migraines.  This is a patient with an extremely complicated past medical history including coronary artery disease, cerebral infarction, heart failure, hypertension, asthma, IBS, tardive dyskinesias, abnormal MRI of the brain with moderate to advanced white matter changes and atrophy, diabetes type 1 with multiple complications, hypothyroidism, CKD, chronic neck pain, neuropathy, degenerative disc disease multiple levels of the spine, anemia, history of Lyme disease, depression, memory loss, chronic headaches and migraines, dizziness, multiple strokes.  She is here with her husband who also provides information.  Very difficult and extended visit, patient is tangential and difficult to redirect  but very pleasant.  She has a long history of migraines with pulsating pounding throbbing, unilateral, light sensitivity, nausea, worsening recently over the last year the headaches are daily. Her blood pressures are elevated. She had lyme disease and babesia in 2009. She was treated for this 5 years later. She has had headaches daily for over a year, after the stroke she started getting headaches. She does not wake up with them.  They do not wake her up. She has a headache now, on the left side above and around the eye, tightening, mostly pressure, They do not last all day long. Sound doesn't bother her. Very tangential. 8/10 every day. Her husband is here, caffeine seems to help. Very tangential. Poor historian. Gabapentin  helps. She gets dizziness with the gabapentin  so she only takes it at night. No significant snoring. No aura. No medication overuse. Ongoing for years at this frequency and severity however not positional and no vision changes or other red flags. No other focal neurologic deficits, associated symptoms, inciting events or modifiable factors.  Reviewed notes, labs and imaging from outside physicians, which showed:  MRI brain 02/08/2016: Personally reviewed images and agree  IMPRESSION:  This MRI of the brain without contrast shows the following: 1.    Moderately severe cortical atrophy 2.    Mild chronic microvascular ischemic changes and evidence of small chronic lacunar infarctions in the pons 3.    Mild maxillary chronic sinusitis. 4.    There are no acute findings.  From a thorough review of records, medications tried that can be used in migraine management include: Amlodipine , aspirin , carvedilol , Flexeril , Prozac , gabapentin , melatonin, magnesium  citrate, Robaxin , Zofran , Phenergan , Topamax,  amitriptyline, tramadol,   Review of Systems: Patient complains of symptoms per HPI as well as the following symptoms tardive dyskinesias, headache. Pertinent negatives and positives per  HPI. All others negative.   Social History   Socioeconomic History   Marital status: Married    Spouse name: Not on file   Number of children: 0   Years of education: college   Highest education level: Not on file  Occupational History   Occupation: Retired  Tobacco Use   Smoking status: Former    Current packs/day: 0.00    Average packs/day: 1 pack/day for 10.0 years (10.0 ttl pk-yrs)    Types: Cigarettes    Start date: 98    Quit date: 1985    Years since quitting: 40.6   Smokeless tobacco: Never   Tobacco comments:      Quit smoking cigarettes in 20's   Vaping Use   Vaping status: Never Used  Substance and Sexual Activity   Alcohol use: Not Currently    Alcohol/week: 7.0 standard drinks of alcohol    Types: 7 Cans of beer per week    Comment: beer nightly   Drug use: No   Sexual activity: Not Currently  Other Topics Concern   Not on file  Social History Narrative   Drinks 1-2  cups caffeine drinks a day    Right handed   Lives at home with husband and dog   Social Drivers of Corporate investment banker Strain: Not on file  Food Insecurity: Low Risk  (09/13/2022)   Received from [facility]   Hunger Vital Sign    Within the past 12 months, you worried that your food would run out before you got money to buy more: Never true    Within the past 12 months, the food you bought just didn't last and you didn't have money to get more. : Never true  Transportation Needs: Not on file (09/13/2022)  Physical Activity: Not on file  Stress: Not on file  Social Connections: Unknown (07/08/2022)   Received from [facility]   Social Network    Social Network: Not on file  Intimate Partner Violence: Unknown (07/08/2022)   Received from [facility]   HITS    Physically Hurt: Not on file    Insult or Talk Down To: Not on file    Threaten Physical Harm: Not on file    Scream or Curse: Not on file    Family History  Problem Relation Age of Onset   Breast cancer  Mother    Migraines Mother    Heart disease Father    Hypertension Father    Breast cancer Sister     Past Medical History:  Diagnosis Date   Anemia    Arthritis    Babesiasis    secondary due to lyme disease   CHF (congestive heart failure) (HCC)    Chronic kidney disease    stage 3   Coronary artery disease    Depression    Diabetes mellitus without complication (HCC)    Type 1   Diabetic retinopathy (HCC)    Erythropoietin  deficiency anemia 10/22/2018   Family history of adverse reaction to anesthesia    mother:  while she was under she stopped breathing.   Fibromyalgia    Gastroparesis    GERD (gastroesophageal reflux disease)    Headache    migraines   Hypothyroidism    IBS (irritable bowel syndrome)    Idiopathic edema  Iron deficiency anemia 09/04/2019   Lyme disease    Mitral valve prolapse    Myocardial infarction (HCC)    1 major in 1999 and 2 minor  small vessel disease.   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   Stroke [facility])    x2  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  the second stroke was a lacunar     Patient Active Problem List   Diagnosis Date Noted   NPH (normal pressure hydrocephalus) (HCC) 08/24/2023   History of Lyme disease 05/01/2023   Mild protein-calorie malnutrition (HCC) 04/25/2023   Dependence on renal dialysis (HCC) 03/29/2023   End stage renal disease (HCC) 03/29/2023   Hypertensive heart and chronic kidney disease with heart failure and stage 1 through stage 4 chronic kidney disease, or unspecified chronic kidney disease (HCC) 03/29/2023   Orthostatic hypotension 03/29/2023   Personal history of nicotine dependence 03/29/2023   Secondary hyperparathyroidism of renal origin (HCC) 03/29/2023   Unspecified sequelae of cerebral infarction 03/29/2023   Pain of left hip joint 11/15/2022   Vitamin D  deficiency 09/30/2022   History of  babesiosis 08/04/2022   Metatarsalgia of right foot 07/27/2022   Pain in right foot 07/14/2022   Acute on chronic diastolic CHF (congestive heart failure) (HCC) 03/27/2022   CKD (chronic kidney disease) stage 4, GFR 15-29 ml/min (HCC) 03/27/2022   Fibromyalgia 03/27/2022   Hypothyroidism 03/27/2022   Transaminitis 03/18/2022   Hyponatremia 03/18/2022   Hyperkalemia 03/18/2022   DKA (diabetic ketoacidosis) (HCC) 03/18/2022   Acute kidney injury superimposed on chronic kidney disease (HCC) 03/18/2022   HTN (hypertension) 03/18/2022   Generalized weakness 03/17/2022   Primary insomnia 09/06/2021   Mild cognitive impairment 06/17/2021   Itching 03/26/2021   Physical deconditioning 03/26/2021   Sleep disorder 03/26/2021   Closed fracture of olecranon process of ulna 12/24/2020   Pain in left elbow 12/14/2020   Urinary frequency 10/12/2020   Fracture of humerus 04/24/2020   Chronic migraine without aura, with intractable migraine, so stated, with status migrainosus 04/01/2020   Herpes genitalis 11/07/2019   History of multiple cerebrovascular accidents (CVAs) 10/30/2019   Iron deficiency anemia 09/04/2019   History of hyperbaric oxygen therapy 08/22/2019   DNR (do not resuscitate) 12/10/2018   OAB (overactive bladder) 11/20/2018   Recurrent UTI 11/20/2018   Vaginal atrophy 11/20/2018   Dizziness 10/22/2018   Tinnitus, right 10/22/2018   Erythropoietin  deficiency anemia 10/22/2018   Arthritis 02/23/2018   Asthma 02/23/2018   Carpal tunnel syndrome 02/23/2018   Gastroesophageal reflux disease 02/23/2018   Seasonal affective disorder (HCC) 02/23/2018   Sepsis with metabolic encephalopathy (HCC) 98/76/7980   Hypertension associated with diabetes (HCC) 02/14/2017   Brain atrophy (HCC) 05/24/2016   Chronic headache 05/24/2016   Lacunar stroke (HCC) 01/20/2016   Memory loss 01/20/2016   Depression 01/20/2016   Babesiosis 12/16/2015   Lyme disease 12/16/2015   Small intestinal  bacterial overgrowth 11/19/2015   Hyperlipidemia 09/25/2015   Closed displaced intra-articular fracture of right calcaneus 06/16/2015   Myopia of both eyes with astigmatism and presbyopia 05/21/2015   Vitreous syneresis of both eyes 05/21/2015   Combined form of age-related cataract, left eye 05/21/2015   Corneal epithelial basement membrane dystrophy 05/21/2015   Pseudophakia, right eye 05/21/2015   CAD (coronary artery disease) 11/19/2014   Diabetes mellitus type I (HCC) 11/19/2014   DDD (degenerative disc disease), cervical  03/11/2014   Cervical spondylosis with radiculopathy 03/11/2014   Spondylolisthesis of cervical region 03/11/2014   Vitreous hemorrhage of left eye (HCC) 03/03/2014   DDD (degenerative disc disease), thoracic 01/07/2014   Anemia due to stage 3 chronic kidney disease (HCC) 12/16/2013   Osteoporosis 11/14/2013   Cataract due to secondary diabetes (HCC) 08/26/2013   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 associated with type 2 diabetes mellitus (HCC) 08/26/2013   Type 1 diabetes mellitus with hypoglycemia (HCC) 08/26/2013   Hypothyroidism due to acquired atrophy of thyroid  08/02/2013   Acquired hypothyroidism 08/02/2013   Cerebral artery occlusion with cerebral infarction (HCC) 07/14/2013   History of diabetic gastroparesis 04/09/2013   Irritable bowel syndrome with diarrhea 04/09/2013   Diabetic gastroparesis (HCC) 04/09/2013   Anemia of chronic disease 04/08/2013   DKA, type 1 (HCC) 04/08/2013   CKD stage 3 due to type 1 diabetes mellitus (HCC) 04/08/2013    Past Surgical History:  Procedure Laterality Date   ABDOMINAL HYSTERECTOMY     APPENDECTOMY     BREAST SURGERY     B/L biopsy and lumpectomy    CARDIAC CATHETERIZATION     CARPAL TUNNEL RELEASE     CATARACT EXTRACTION W/ INTRAOCULAR LENS IMPLANT     right eye   COLONOSCOPY W/ BIOPSIES AND POLYPECTOMY     CORONARY ARTERY BYPASS GRAFT     coronary artery stents     at  [facility] and [place]   DIALYSIS/PERMA CATHETER INSERTION N/A 03/24/2023   Procedure: DIALYSIS/PERMA CATHETER INSERTION;  Surgeon: Tobie Gordy POUR, MD;  Location: [facility] INVASIVE CV LAB;  Service: Cardiovascular;  Laterality: N/A;   ECTOPIC PREGNANCY SURGERY     IR FLUORO GUIDE CV LINE RIGHT  05/22/2023   NASAL SEPTUM SURGERY     OPEN REDUCTION INTERNAL FIXATION (ORIF) DISTAL RADIAL FRACTURE Right 05/06/2015   Procedure: OPEN REDUCTION INTERNAL FIXATION (ORIF) RIGHT DISTAL RADIAL FRACTURE AND REPAIRS AS NEEDED;  Surgeon: Prentice Pagan, MD;  Location: [facility] OR;  Service: Orthopedics;  Laterality: Right;   ORIF HUMERUS FRACTURE Right 04/30/2020   Procedure: OPEN REDUCTION INTERNAL FIXATION (ORIF) PROXIMAL HUMERUS FRACTURE;  Surgeon: Melita Drivers, MD;  Location: [facility] ORS;  Service: Orthopedics;  Laterality: Right;    ORIF WRIST FRACTURE Left 11/05/2014   ORIF WRIST FRACTURE Left 11/05/2014   Procedure: OPEN REDUCTION INTERNAL FIXATION (ORIF) LEFT WRIST FRACTURE AND REPAIR AS INDICATED;  Surgeon: Prentice Pagan, MD;  Location: [facility] OR;  Service: Orthopedics;  Laterality: Left;   TRIGGER FINGER RELEASE     TUNNELLED CATHETER EXCHANGE N/A 06/01/2023   Procedure: TUNNELLED CATHETER EXCHANGE;  Surgeon: Melia Lynwood ORN, MD;  Location: [facility] INVASIVE CV LAB;  Service: Cardiovascular;  Laterality: N/A;    Current Outpatient Medications  Medication Sig Dispense Refill   ACCU-CHEK GUIDE test strip 3 (three) times daily.     aspirin  EC 325 MG tablet Take 1 tablet (325 mg total) by mouth daily. 30 tablet 0   b complex-vitamin Pace -folic acid  (NEPHRO-VITE) 0.8 MG TABS tablet Take 1 tablet by mouth daily.     carvedilol  (COREG ) 12.5 MG tablet TAKE 1 TABLET BY MOUTH TWICE DAILY WITH A MEAL 180 tablet 3   cetirizine (ZYRTEC) 10 MG tablet Take 10 mg by mouth at bedtime.     Continuous Glucose Sensor (DEXCOM G7 SENSOR) MISC Inject 1 each into the skin as directed. Every 10 days change it.     Darbepoetin Alfa -Albumin (ARANESP  IJ) Inject as  directed.  dexamethasone  (DECADRON ) 0.1 % ophthalmic solution 1 drop. Left ear daily.     diphenoxylate -atropine  (LOMOTIL ) 2.5-0.025 MG tablet TAKE 1 TABLET BY MOUTH 4 TIMES DAILY AS NEEDED FOR DIARRHEA OR  LOOSE  STOOLS 45 tablet 0   Doxercalciferol (HECTOROL IV) 2 mcg.     esomeprazole (NEXIUM) 20 MG capsule Take 20 mg by mouth every morning.      Glucagon (GVOKE HYPOPEN 2-PACK) 0.5 MG/0.1ML SOAJ Use as needed in case of hypoglycemic emergency     hydrOXYzine  (ATARAX ) 10 MG tablet TAKE 1 TABLET BY MOUTH EVERY 6 HOURS AS NEEDED 90 tablet 0   ketoconazole  (NIZORAL ) 2 % shampoo Apply 1 Application topically 2 (two) times a week.     ketorolac  (ACULAR ) 0.5 % ophthalmic solution Place 1 drop into both eyes 2 (two) times daily.     Melatonin Gummies 2.5 MG CHEW Chew 2.5 mg by mouth at bedtime as needed (sleep).     Methoxy PEG-Epoetin  Beta (MIRCERA IJ) 75 mcg.     mirtazapine  (REMERON ) 30 MG tablet Take 30 mg by mouth at bedtime.     Multiple Vitamins-Minerals (MULTIVITAMIN WITH MINERALS) tablet Take 1 tablet by mouth daily. + Zinc     nitroGLYCERIN  (NITROLINGUAL ) 0.4 MG/SPRAY spray Place 1 spray under the tongue every 5 (five) minutes x 3 doses as needed. 12 g 3   NOVOLOG  100 UNIT/ML injection Inject 25 Units into the skin continuous. Insulin  pump     OVER THE COUNTER MEDICATION Take 1 capsule by mouth daily. Conjugated linoleic acid     OVER THE COUNTER MEDICATION Take 1 tablet by mouth daily. Magnesium  Threonate     prednisoLONE acetate (PRED FORTE) 1 % ophthalmic suspension Place 1 drop into both eyes 2 (two) times daily.     PREMARIN  vaginal cream Place 1 applicator vaginally daily as needed (irritation).     torsemide  ([name] ) 20 MG tablet Take 2 tablets (40 mg total) by mouth daily.     valACYclovir  (VALTREX ) 500 MG tablet Take 500 mg by mouth daily as needed (Flair up).     fluocinonide  (LIDEX ) 0.05 % external solution Apply 1 Application topically daily. (Patient not taking: Reported on  08/23/2023) 60 mL 0   thyroid  (ARMOUR THYROID ) 60 MG tablet Take 60 mg by mouth daily before breakfast. (Patient not taking: Reported on 08/23/2023)     No current facility-administered medications for this visit.   Facility-Administered Medications Ordered in Other Visits  Medication Dose Route Frequency Provider Last Rate Last Admin   Darbepoetin Alfa  (ARANESP ) injection 300 mcg  300 mcg Subcutaneous Once Ennever, Peter R, MD        Allergies as of 08/23/2023 - Review Complete 08/23/2023  Allergen Reaction Noted   Morphine  Anaphylaxis and Shortness Of Breath 02/07/2013   Penicillins Hives 02/07/2013   Tramadol Anaphylaxis 11/19/2014   Acetaminophen  Other (See Comments) 11/06/2014   Amitriptyline Other (See Comments) 02/07/2013   Codeine Other (See Comments) 02/07/2013   Ibuprofen Other (See Comments) 05/05/2015   Krill oil Diarrhea, Itching, and Nausea And Vomiting 01/24/2018   Lactose intolerance (gi) Diarrhea 05/01/2023   Losartan Cough 11/20/2018   Propoxyphene Other (See Comments) 02/07/2013   Shellfish allergy Diarrhea and Nausea And Vomiting 07/12/2017   Statins Other (See Comments) 02/07/2013   Sulfamethoxazole Other (See Comments) 02/07/2013   Sulfites Itching and Other (See Comments) 01/24/1990   Ace inhibitors Other (See Comments) 05/01/2023   Fluconazole  07/17/2020   Norvasc  [amlodipine  besylate] Swelling 09/08/2020    Vitals:  BP 134/64   Pulse 68   Ht 4' 10 (1.473 m)   Wt 107 lb (48.5 kg)   BMI 22.36 kg/m  Last Weight:  Wt Readings from Last 1 Encounters:  08/23/23 107 lb (48.5 kg)   Last Height:   Ht Readings from Last 1 Encounters:  08/23/23 4' 10 (1.473 m)    Physical exam: stable Exam: Gen: NAD, conversant, well nourised,  well groomed                     CV: RRR, no MRG. No Carotid Bruits. No peripheral edema, warm, nontender Eyes: Conjunctivae clear without exudates or hemorrhage  Neuro: stable Detailed Neurologic Exam  Speech:    Speech  is normal; fluent and spontaneous with normal comprehension.  Cognition:     08/23/2023    2:12 PM 03/01/2023    1:20 PM  MMSE - Mini Mental State Exam  Orientation to time 5 5  Orientation to Place 5 5  Registration 3 3  Attention/ Calculation 1 5  Recall 3 3  Language- name 2 objects 2 2  Language- repeat 1 1  Language- follow 3 step command 3 3  Language- read & follow direction 1 1  Write a sentence 1 1  Copy design 1 1  Total score 26 30       The patient is oriented to person, place, and time;     recent and remote memory intact;     language fluent;     normal attention, concentration,     fund of knowledge Cranial Nerves:    The pupils are equal, round, and reactive to light. Pupils too small to visualize fundi. Visual fields are full to finger confrontation. Extraocular movements are intact. Trigeminal sensation is intact and the muscles of mastication are normal. The face is symmetric. The palate elevates in the midline. Hearing intact. Voice is normal. Shoulder shrug is normal. The tongue has normal motion without fasciculations.   Coordination: normal  Gait: Antalgic with cane(Left hip pain) Good clearance Good stance (not narrow)  Motor Observation: Tardive dyskinesias Tone:    Normal muscle tone.    Posture:    Posture is normal. normal erect    Strength: Motor limited by pain but appears symmetrical without focal weakness     Sensation: Neg romberg     Reflex Exam:  DTR's: Absent AJs Toes:    The toes are equiv bilaterally.   Clonus:    Clonus is absent.       Assessment/Plan:   Patient is is here for memory loss. Complicated past medical history: has CAD (coronary artery disease); Diabetes mellitus type I (HCC); Anemia due to stage 3 chronic kidney disease (HCC); Anemia of chronic disease; Babesiosis; Cataract due to secondary diabetes (HCC); Cerebral artery occlusion with cerebral infarction (HCC); DDD (degenerative disc disease), cervical;  DDD (degenerative disc disease), thoracic; History of diabetic gastroparesis; Hyperlipidemia; Hypothyroidism due to acquired atrophy of thyroid ; Irritable bowel syndrome with diarrhea; Lyme disease; Myopia of both eyes with astigmatism and presbyopia; Osteoporosis; 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 associated with type 2 diabetes mellitus (HCC); DKA, type 1 (HCC); Vitreous hemorrhage of left eye (HCC); Vitreous syneresis of both eyes; Combined form of age-related cataract, left eye; Lacunar stroke (HCC); Memory loss; Depression; Brain atrophy (HCC); Chronic headache; CKD stage 3 due to type 1 diabetes mellitus (HCC); Hypertension associated with diabetes (HCC); Acquired hypothyroidism; Arthritis; Asthma; Carpal tunnel syndrome; Cervical spondylosis with  radiculopathy; Closed displaced intra-articular fracture of right calcaneus; Corneal epithelial basement membrane dystrophy; Dizziness; Gastroesophageal reflux disease; Pseudophakia, right eye; Seasonal affective disorder (HCC); Sepsis with metabolic encephalopathy (HCC); Small intestinal bacterial overgrowth; Tinnitus, right; Spondylolisthesis of cervical region; Erythropoietin  deficiency anemia; DNR (do not resuscitate); Iron deficiency anemia; History of multiple cerebrovascular accidents (CVAs); Herpes genitalis; History of hyperbaric oxygen therapy; OAB (overactive bladder); Recurrent UTI; Vaginal atrophy; Chronic migraine without aura, with intractable migraine, so stated, with status migrainosus; Urinary frequency; Generalized weakness; Transaminitis; Hyponatremia; Hyperkalemia; DKA (diabetic ketoacidosis) (HCC); Acute kidney injury superimposed on chronic kidney disease (HCC); HTN (hypertension); Acute on chronic diastolic CHF (congestive heart failure) (HCC); CKD (chronic kidney disease) stage 4, GFR 15-29 ml/min (HCC); Fibromyalgia; and Hypothyroidism on their problem list. MMSE 30/30.  She does have moderate small  vessel disease and atrophy but ventricle size appears to be due to atrophy and not NPH. Difficult to assess due to so many comorbidities but I suspect her symptoms are likely due to depression, anxiety, chronic pain, complicated medical history. At last appointment sent her for formal neurocognitive testing prior to further evaluation but will hold off, she has recent stressors and prefers ot hold off prior to memory testing or testing for alzheimer's related biomarkers.   See comparisons of MRIs that are years inbetween(2018 on the left and 2025 on the right below) below it does not appear as though the ventricle size or the chronic microvascular disease have significantly worsened over the years. She had a successful high-volume tap with Dr. Malcolm and planning for shunting for NPH. Continue to follow with Dr. Malcolm  Return to clinic when you would like to continue evaluation for memory changes           Cc: Ennever, Maude SAUNDERS, MD,  Timmy Maude SAUNDERS, MD  Onetha Epp, MD  [facility] [address] [place], [place] [address]  Phone [phone] Fax [phone]  I spent 35 minutes of face-to-face and non-face-to-face time with patient on the  1. Mild cognitive impairment   2. NPH (normal pressure hydrocephalus) (HCC)      diagnosis.  This included previsit chart review, lab review, study review, order entry, electronic health record documentation, patient education on the different diagnostic and therapeutic options, counseling and coordination of care, risks and benefits of management, compliance, or risk factor reduction

## 2023-08-24 ENCOUNTER — Encounter: Payer: Self-pay | Admitting: Neurology

## 2023-08-24 DIAGNOSIS — G912 (Idiopathic) normal pressure hydrocephalus: Secondary | ICD-10-CM | POA: Insufficient documentation

## 2023-08-29 ENCOUNTER — Other Ambulatory Visit: Payer: Self-pay | Admitting: Vascular Surgery

## 2023-08-29 DIAGNOSIS — N186 End stage renal disease: Secondary | ICD-10-CM

## 2023-09-13 ENCOUNTER — Inpatient Hospital Stay (HOSPITAL_BASED_OUTPATIENT_CLINIC_OR_DEPARTMENT_OTHER): Admitting: Hematology & Oncology

## 2023-09-13 ENCOUNTER — Encounter: Payer: Self-pay | Admitting: Hematology & Oncology

## 2023-09-13 ENCOUNTER — Inpatient Hospital Stay

## 2023-09-13 ENCOUNTER — Inpatient Hospital Stay: Attending: Hematology & Oncology

## 2023-09-13 VITALS — BP 101/86 | HR 67 | Temp 98.1°F | Resp 20 | Ht <= 58 in | Wt 106.8 lb

## 2023-09-13 DIAGNOSIS — Z8673 Personal history of transient ischemic attack (TIA), and cerebral infarction without residual deficits: Secondary | ICD-10-CM | POA: Insufficient documentation

## 2023-09-13 DIAGNOSIS — D508 Other iron deficiency anemias: Secondary | ICD-10-CM

## 2023-09-13 DIAGNOSIS — D631 Anemia in chronic kidney disease: Secondary | ICD-10-CM | POA: Diagnosis not present

## 2023-09-13 DIAGNOSIS — Z794 Long term (current) use of insulin: Secondary | ICD-10-CM | POA: Insufficient documentation

## 2023-09-13 DIAGNOSIS — I120 Hypertensive chronic kidney disease with stage 5 chronic kidney disease or end stage renal disease: Secondary | ICD-10-CM | POA: Insufficient documentation

## 2023-09-13 DIAGNOSIS — N186 End stage renal disease: Secondary | ICD-10-CM | POA: Insufficient documentation

## 2023-09-13 DIAGNOSIS — Z79899 Other long term (current) drug therapy: Secondary | ICD-10-CM | POA: Insufficient documentation

## 2023-09-13 DIAGNOSIS — E1022 Type 1 diabetes mellitus with diabetic chronic kidney disease: Secondary | ICD-10-CM | POA: Diagnosis not present

## 2023-09-13 DIAGNOSIS — Z992 Dependence on renal dialysis: Secondary | ICD-10-CM | POA: Diagnosis not present

## 2023-09-13 DIAGNOSIS — E1059 Type 1 diabetes mellitus with other circulatory complications: Secondary | ICD-10-CM

## 2023-09-13 LAB — CBC WITH DIFFERENTIAL (CANCER CENTER ONLY)
Abs Immature Granulocytes: 0.07 K/uL (ref 0.00–0.07)
Basophils Absolute: 0.1 K/uL (ref 0.0–0.1)
Basophils Relative: 1 %
Eosinophils Absolute: 0.6 K/uL — ABNORMAL HIGH (ref 0.0–0.5)
Eosinophils Relative: 8 %
HCT: 34.4 % — ABNORMAL LOW (ref 36.0–46.0)
Hemoglobin: 11.2 g/dL — ABNORMAL LOW (ref 12.0–15.0)
Immature Granulocytes: 1 %
Lymphocytes Relative: 17 %
Lymphs Abs: 1.3 K/uL (ref 0.7–4.0)
MCH: 30.3 pg (ref 26.0–34.0)
MCHC: 32.6 g/dL (ref 30.0–36.0)
MCV: 93 fL (ref 80.0–100.0)
Monocytes Absolute: 0.7 K/uL (ref 0.1–1.0)
Monocytes Relative: 10 %
Neutro Abs: 4.7 K/uL (ref 1.7–7.7)
Neutrophils Relative %: 63 %
Platelet Count: 299 K/uL (ref 150–400)
RBC: 3.7 MIL/uL — ABNORMAL LOW (ref 3.87–5.11)
RDW: 13.7 % (ref 11.5–15.5)
WBC Count: 7.5 K/uL (ref 4.0–10.5)
nRBC: 0 % (ref 0.0–0.2)

## 2023-09-13 LAB — CMP (CANCER CENTER ONLY)
ALT: 15 U/L (ref 0–44)
AST: 28 U/L (ref 15–41)
Albumin: 4.4 g/dL (ref 3.5–5.0)
Alkaline Phosphatase: 130 U/L — ABNORMAL HIGH (ref 38–126)
Anion gap: 14 (ref 5–15)
BUN: 38 mg/dL — ABNORMAL HIGH (ref 8–23)
CO2: 26 mmol/L (ref 22–32)
Calcium: 9.5 mg/dL (ref 8.9–10.3)
Chloride: 96 mmol/L — ABNORMAL LOW (ref 98–111)
Creatinine: 3.04 mg/dL — ABNORMAL HIGH (ref 0.44–1.00)
GFR, Estimated: 16 mL/min — ABNORMAL LOW (ref >60)
Glucose, Bld: 296 mg/dL — ABNORMAL HIGH (ref 70–99)
Potassium: 4.8 mmol/L (ref 3.5–5.1)
Sodium: 136 mmol/L (ref 135–145)
Total Bilirubin: 0.3 mg/dL (ref 0.0–1.2)
Total Protein: 6.7 g/dL (ref 6.5–8.1)

## 2023-09-13 LAB — IRON AND IRON BINDING CAPACITY (CC-WL,HP ONLY)
Iron: 75 ug/dL (ref 28–170)
Saturation Ratios: 25 % (ref 10.4–31.8)
TIBC: 301 ug/dL (ref 250–450)
UIBC: 226 ug/dL

## 2023-09-13 LAB — RETICULOCYTES
Immature Retic Fract: 2.5 % (ref 2.3–15.9)
RBC.: 3.71 MIL/uL — ABNORMAL LOW (ref 3.87–5.11)
Retic Count, Absolute: 21.1 K/uL (ref 19.0–186.0)
Retic Ct Pct: 0.6 % (ref 0.4–3.1)

## 2023-09-13 LAB — FERRITIN: Ferritin: 403 ng/mL — ABNORMAL HIGH (ref 11–307)

## 2023-09-13 NOTE — Progress Notes (Signed)
 Hematology and Oncology Follow Up Visit  Brittney Pace [id number] July 17, 1955 68 y.o. 09/13/2023   Principle Diagnosis:  Anemia of erythropoietin  deficiency - chronic kidney disease Insulin -dependent diabetes History of TIAs   Current Therapy:        Aranesp  300 mcg SQ for Hgb < 11    Interim History:  Brittney Pace is here today for follow-up.  She is not too happy about having to do hemodialysis.  She apparently has some issues last time that she was there.  She is hopefully going to have peritoneal dialysis.  She has seen the vascular surgery.  I think that she was seen as a vascular surgeon in early September and hopefully will have the peritoneal catheter placed after that.  There is still no date set for the VP shunt.  The Neurosurgeon wants to have the dialysis catheter removed first.  Her blood sugars as always are on the high side.  Her blood sugar today was close to 300.  She said that she had a large breakfast.  She lost her dog this past Monday.  This has been very traumatic for her.  I totally understand this.  We certainly share stories.  Her iron studies that we did back in July showed a ferritin of 560 with an iron saturation of 33%.  Overall, I would say that her performance status is probably ECOG 1.   Wt Readings from Last 3 Encounters:  09/13/23 106 lb 12.8 oz (48.4 kg)  08/23/23 107 lb (48.5 kg)  08/16/23 105 lb 1.9 oz (47.7 kg)    Medications:  Allergies as of 09/13/2023       Reactions   Morphine  Anaphylaxis, Shortness Of Breath   Penicillins Hives   Tolerated ANCEF  on 04/30/20 Has patient had a PCN reaction causing immediate rash, facial/tongue/throat swelling, SOB or lightheadedness with hypotension: Yes Has patient had a PCN reaction causing severe rash involving mucus membranes or skin necrosis: No Has patient had a PCN reaction that required hospitalization No Has patient had a PCN reaction occurring within the last 10 years: No If all of the  above answers are NO, then may proceed with Cephalosporin use.   Tramadol Anaphylaxis   Acetaminophen  Other (See Comments)   Alters insulin  pump readings   Amitriptyline Other (See Comments)   Severe headache/ out of body feeling   Codeine Other (See Comments)   Severe headaches/ out of body feeling   Ibuprofen Other (See Comments)   Messes up CGM reading on glucose monitor   Krill Oil Diarrhea, Itching, Nausea And Vomiting   Lactose Intolerance (gi) Diarrhea   Losartan Cough   Propoxyphene Other (See Comments)   Severe headaches / out of body feeling   Shellfish Allergy Diarrhea, Nausea And Vomiting   Statins Other (See Comments)   Muscle pains Other reaction(s): Other   Sulfamethoxazole Other (See Comments)   Mouth ulcers   Sulfites Itching, Other (See Comments)   Mouth ulcers   Ace Inhibitors Other (See Comments)   Other Reaction(s): Unknown   Fluconazole    Other reaction(s): Contact Dermatitis (intolerance)   Norvasc  [amlodipine  Besylate] Swelling        Medication List        Accurate as of September 13, 2023  2:09 PM. If you have any questions, ask your nurse or doctor.          Accu-Chek Guide test strip Generic drug: glucose blood 3 (three) times daily.   ARANESP  IJ Inject as directed.  Armour Thyroid  60 MG tablet Generic drug: thyroid  Take 60 mg by mouth daily before breakfast.   aspirin  EC 325 MG tablet Take 1 tablet (325 mg total) by mouth daily.   b complex-vitamin c -folic acid  0.8 MG Tabs tablet Take 1 tablet by mouth daily.   carvedilol  12.5 MG tablet Commonly known as: COREG  TAKE 1 TABLET BY MOUTH TWICE DAILY WITH A MEAL   cetirizine 10 MG tablet Commonly known as: ZYRTEC Take 10 mg by mouth at bedtime.   dexamethasone  0.1 % ophthalmic solution Commonly known as: DECADRON  1 drop. Left ear daily.   Dexcom G7 Sensor Misc Inject 1 each into the skin as directed. Every 10 days change it.   diclofenac  Sodium 1 % Gel Commonly known  as: VOLTAREN  Apply 1 Application topically daily.   diphenoxylate -atropine  2.5-0.025 MG tablet Commonly known as: LOMOTIL  TAKE 1 TABLET BY MOUTH 4 TIMES DAILY AS NEEDED FOR DIARRHEA OR  LOOSE  STOOLS   esomeprazole 20 MG capsule Commonly known as: NEXIUM Take 20 mg by mouth every morning.   fluocinonide  0.05 % external solution Commonly known as: LIDEX  Apply 1 Application topically daily.   Gvoke HypoPen 2-Pack 0.5 MG/0.1ML Soaj Generic drug: Glucagon Use as needed in case of hypoglycemic emergency   HECTOROL IV 2 mcg.   hydrOXYzine  10 MG tablet Commonly known as: ATARAX  TAKE 1 TABLET BY MOUTH EVERY 6 HOURS AS NEEDED   ketoconazole  2 % shampoo Commonly known as: NIZORAL  Apply 1 Application topically 2 (two) times a week.   ketorolac  0.5 % ophthalmic solution Commonly known as: ACULAR  Place 1 drop into both eyes 2 (two) times daily.   Melatonin Gummies 2.5 MG Chew Chew 2.5 mg by mouth at bedtime as needed (sleep).   MIRCERA IJ 75 mcg.   mirtazapine  30 MG tablet Commonly known as: REMERON  Take 30 mg by mouth at bedtime.   multivitamin with minerals tablet Take 1 tablet by mouth daily. + Zinc   nitroGLYCERIN  0.4 MG/SPRAY spray Commonly known as: NITROLINGUAL  Place 1 spray under the tongue every 5 (five) minutes x 3 doses as needed.   NovoLOG  100 UNIT/ML injection Generic drug: insulin  aspart Inject 25 Units into the skin continuous. Insulin  pump   OVER THE COUNTER MEDICATION Take 1 capsule by mouth daily. Conjugated linoleic acid   OVER THE COUNTER MEDICATION Take 1 tablet by mouth daily. Magnesium  Threonate   prednisoLONE acetate 1 % ophthalmic suspension Commonly known as: PRED FORTE Place 1 drop into both eyes 2 (two) times daily.   Premarin  vaginal cream Generic drug: conjugated estrogens  Place 1 applicator vaginally daily as needed (irritation).   torsemide  20 MG tablet Commonly known as: [name]  Take 2 tablets (40 mg total) by mouth daily.    valACYclovir  500 MG tablet Commonly known as: VALTREX  Take 500 mg by mouth daily as needed (Flair up).        Allergies:  Allergies  Allergen Reactions   Morphine  Anaphylaxis and Shortness Of Breath   Penicillins Hives    Tolerated ANCEF  on 04/30/20 Has patient had a PCN reaction causing immediate rash, facial/tongue/throat swelling, SOB or lightheadedness with hypotension: Yes Has patient had a PCN reaction causing severe rash involving mucus membranes or skin necrosis: No Has patient had a PCN reaction that required hospitalization No Has patient had a PCN reaction occurring within the last 10 years: No If all of the above answers are NO, then may proceed with Cephalosporin use.   Tramadol Anaphylaxis   Acetaminophen  Other (See  Comments)    Alters insulin  pump readings    Amitriptyline Other (See Comments)    Severe headache/ out of body feeling   Codeine Other (See Comments)    Severe headaches/ out of body feeling    Ibuprofen Other (See Comments)    Messes up CGM reading on glucose monitor     Krill Oil Diarrhea, Itching and Nausea And Vomiting   Lactose Intolerance (Gi) Diarrhea   Losartan Cough   Propoxyphene Other (See Comments)    Severe headaches / out of body feeling    Shellfish Allergy Diarrhea and Nausea And Vomiting   Statins Other (See Comments)    Muscle pains Other reaction(s): Other   Sulfamethoxazole Other (See Comments)    Mouth ulcers    Sulfites Itching and Other (See Comments)    Mouth ulcers   Ace Inhibitors Other (See Comments)    Other Reaction(s): Unknown   Fluconazole     Other reaction(s): Contact Dermatitis (intolerance)   Norvasc  [Amlodipine  Besylate] Swelling    Past Medical History, Surgical history, Social history, and Family History were reviewed and updated.  Review of Systems: Review of Systems  Constitutional:  Positive for malaise/fatigue. Negative for chills and fever.  HENT: Negative.    Eyes:  Positive for  blurred vision.  Respiratory:  Positive for shortness of breath (mild).   Cardiovascular:  Negative for leg swelling.  Gastrointestinal:  Negative for abdominal pain and diarrhea.  Genitourinary:  Negative for dysuria, flank pain, frequency, hematuria and urgency.  Musculoskeletal:  Positive for joint pain and myalgias.  Skin:  Negative for rash.  Neurological:  Positive for headaches.  Endo/Heme/Allergies: Negative.   Psychiatric/Behavioral: Negative.     Wt Readings from Last 3 Encounters:  09/13/23 106 lb 12.8 oz (48.4 kg)  08/23/23 107 lb (48.5 kg)  08/16/23 105 lb 1.9 oz (47.7 kg)   Vital signs show a temperature 98.1.  Pulse 67.  Blood pressure 101/86.  Weight is 106 pounds .     Physical Exam Vitals reviewed.  Constitutional:      Comments: Gentle use of cane for ambulation   HENT:     Head: Normocephalic and atraumatic.  Eyes:     Pupils: Pupils are equal, round, and reactive to light.  Cardiovascular:     Rate and Rhythm: Normal rate and regular rhythm.     Heart sounds: Normal heart sounds.  Pulmonary:     Effort: Pulmonary effort is normal.     Breath sounds: Normal breath sounds.  Musculoskeletal:     Cervical back: Normal range of motion.     Right lower leg: No edema.     Left lower leg: No edema.  Lymphadenopathy:     Cervical: No cervical adenopathy.  Skin:    General: Skin is warm and dry.     Findings: No erythema or rash.  Neurological:     Mental Status: She is alert and oriented to person, place, and time. Mental status is at baseline.     Comments: Cranial nerves are intact  Ambulating slowly with cane  Psychiatric:        Behavior: Behavior normal.        Thought Content: Thought content normal.        Judgment: Judgment normal.     Lab Results  Component Value Date   WBC 7.5 09/13/2023   HGB 11.2 (L) 09/13/2023   HCT 34.4 (L) 09/13/2023   MCV 93.0 09/13/2023   PLT 299 09/13/2023  Lab Results  Component Value Date   FERRITIN 560  (H) 08/16/2023   IRON 97 08/16/2023   TIBC 295 08/16/2023   UIBC 198 08/16/2023   IRONPCTSAT 33 (H) 08/16/2023   Lab Results  Component Value Date   RETICCTPCT 0.6 09/13/2023   RBC 3.70 (L) 09/13/2023   RBC 3.71 (L) 09/13/2023   No results found for: KPAFRELGTCHN, LAMBDASER, KAPLAMBRATIO No results found for: IGGSERUM, IGA, IGMSERUM No results found for: TOTALPROTELP, ALBUMINELP, A1GS, A2GS, BETS, BETA2SER, GAMS, MSPIKE, SPEI   Chemistry      Component Value Date/Time   NA 140 08/16/2023 1311   NA 138 07/17/2017 1128   K 4.5 08/16/2023 1311   CL 96 (L) 08/16/2023 1311   CO2 30 08/16/2023 1311   BUN 38 (H) 08/16/2023 1311   BUN 23 07/17/2017 1128   CREATININE 2.51 (H) 08/16/2023 1311   CREATININE 2.45 (H) 03/03/2022 1129      Component Value Date/Time   CALCIUM  9.6 08/16/2023 1311   CALCIUM  8.7 02/06/2023 0000   ALKPHOS 122 08/16/2023 1311   AST 30 08/16/2023 1311   ALT 16 08/16/2023 1311   BILITOT 0.4 08/16/2023 1311      Impression and Plan: Ms. Bennett is a very pleasant 68 yo female with multifactorial anemia. She has CKD, HTN, DM1.   Again, she is not too happy about having to do hemodialysis still.  She really wants to do peritoneal dialysis.  Hopefully everything will come together for this in September.   Thankfully, she does not need to have any Aranesp  today.  We will plan to get her back in another month.  I think this would be very reasonable...  Maude JONELLE Crease, MD 8/20/20252:09 PM

## 2023-10-02 NOTE — Progress Notes (Unsigned)
 [facility] [place]  ASSESSMENT / PLAN: Brittney Pace is a 68 y.o. right handed female in need of permanent dialysis access. She is in need of VP or VA shunt. PD catheter is not ideal in setting of VP shunt. TDC not ideal for VA shunt. Her house is not optimized for PD. Plan LUE AV access.   CHIEF COMPLAINT: renal failure  HISTORY OF PRESENT ILLNESS: Brittney Pace is a 68 y.o. female with ESRD on HD via RIJ TDC TTS. Patient presents to care for evaluation of PD catheter. The patient desires home based dialysis. She has had laparoscopic abdominal surgery only. She is right handed. We reviewed the technical details of lap PD catheter placement.  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   Fibromyalgia    Gastroparesis    GERD (gastroesophageal reflux disease)    Headache    migraines   Hypothyroidism    IBS (irritable bowel syndrome)    Idiopathic edema    Iron deficiency anemia 09/04/2019   Lyme disease    Mitral valve prolapse    Myocardial infarction (HCC)    1 major in 1999 and 2 minor  small vessel disease.   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  the second stroke was a lacunar     Past Surgical History:  Procedure Laterality Date   ABDOMINAL HYSTERECTOMY     APPENDECTOMY     BREAST SURGERY     B/L biopsy and lumpectomy    CARDIAC CATHETERIZATION     CARPAL  TUNNEL RELEASE     CATARACT EXTRACTION W/ INTRAOCULAR LENS IMPLANT     right eye   COLONOSCOPY W/ BIOPSIES AND POLYPECTOMY     CORONARY ARTERY BYPASS GRAFT     coronary artery stents     at [facility] and [place]   DIALYSIS/PERMA CATHETER INSERTION N/A 03/24/2023   Procedure: DIALYSIS/PERMA CATHETER INSERTION;  Surgeon: Tobie Gordy POUR, MD;  Location: [facility] INVASIVE CV LAB;  Service: Cardiovascular;  Laterality: N/A;   ECTOPIC PREGNANCY SURGERY     IR FLUORO GUIDE CV LINE RIGHT  05/22/2023   NASAL SEPTUM SURGERY     OPEN REDUCTION INTERNAL FIXATION (ORIF) DISTAL RADIAL FRACTURE Right 05/06/2015   Procedure: OPEN REDUCTION INTERNAL FIXATION (ORIF) RIGHT DISTAL RADIAL FRACTURE AND REPAIRS AS NEEDED;  Surgeon: Prentice Pagan, MD;  Location: [facility] OR;  Service: Orthopedics;  Laterality: Right;   ORIF HUMERUS FRACTURE Right 04/30/2020   Procedure: OPEN REDUCTION INTERNAL FIXATION (ORIF) PROXIMAL HUMERUS FRACTURE;  Surgeon: Melita Drivers, MD;  Location: [facility] ORS;  Service: Orthopedics;  Laterality: Right;    ORIF WRIST FRACTURE Left 11/05/2014   ORIF WRIST FRACTURE Left 11/05/2014   Procedure: OPEN REDUCTION INTERNAL FIXATION (ORIF) LEFT WRIST FRACTURE AND REPAIR AS  INDICATED;  Surgeon: Prentice Pagan, MD;  Location: [facility] OR;  Service: Orthopedics;  Laterality: Left;   TRIGGER FINGER RELEASE     TUNNELLED CATHETER EXCHANGE N/A 06/01/2023   Procedure: TUNNELLED CATHETER EXCHANGE;  Surgeon: Melia Lynwood ORN, MD;  Location: [facility] INVASIVE CV LAB;  Service: Cardiovascular;  Laterality: N/A;    Family History  Problem Relation Age of Onset   Breast cancer Mother    Migraines Mother    Heart disease Father    Hypertension Father    Breast cancer Sister     Social History   Socioeconomic History   Marital status: Married    Spouse name: Not on file   Number of children: 0   Years of education: college   Highest education level: Not on file  Occupational History   Occupation: Retired  Tobacco Use   Smoking status:  Former    Current packs/day: 1.00    Average packs/day: 1 pack/day for 50.7 years (50.7 ttl pk-yrs)    Types: Cigarettes    Start date: 1975   Smokeless tobacco: Never   Tobacco comments:      Quit smoking cigarettes in 20's   Vaping Use   Vaping status: Never Used  Substance and Sexual Activity   Alcohol use: Not Currently    Alcohol/week: 7.0 standard drinks of alcohol    Types: 7 Cans of beer per week    Comment: beer nightly   Drug use: No   Sexual activity: Not Currently  Other Topics Concern   Not on file  Social History Narrative   Drinks 1-2  cups caffeine drinks a day    Right handed   Lives at home with husband and dog   Social Drivers of Corporate investment banker Strain: Not on file  Food Insecurity: Unknown (09/29/2023)   Received from [facility]   Hunger Vital Sign    Within the past 12 months, you worried that your food would run out before you got money to buy more: Patient declined to answer    Within the past 12 months, the food you bought just didn't last and you didn't have money to get more. : Patient declined to answer  Transportation Needs: Not on file (09/29/2023)  Physical Activity: Not on file  Stress: Not on file  Social Connections: Unknown (07/08/2022)   Received from [facility]   Social Network    Social Network: Not on file  Intimate Partner Violence: Unknown (07/08/2022)   Received from [facility]   HITS    Physically Hurt: Not on file    Insult or Talk Down To: Not on file    Threaten Physical Harm: Not on file    Scream or Curse: Not on file    Allergies  Allergen Reactions   Morphine  Anaphylaxis and Shortness Of Breath   Penicillins Hives    Tolerated ANCEF  on 04/30/20 Has patient had a PCN reaction causing immediate rash, facial/tongue/throat swelling, SOB or lightheadedness with hypotension: Yes Has patient had a PCN reaction causing severe rash involving mucus membranes or skin necrosis: No Has patient had a PCN reaction  that required hospitalization No Has patient had a PCN reaction occurring within the last 10 years: No If all of the above answers are NO, then may proceed with Cephalosporin use.   Tramadol Anaphylaxis   Acetaminophen  Other (See Comments)    Alters insulin  pump readings    Amitriptyline Other (See Comments)    Severe headache/ out of  body feeling   Codeine Other (See Comments)    Severe headaches/ out of body feeling    Ibuprofen Other (See Comments)    Messes up CGM reading on glucose monitor     Krill Oil Diarrhea, Itching and Nausea And Vomiting   Lactose Intolerance (Gi) Diarrhea   Losartan Cough   Propoxyphene Other (See Comments)    Severe headaches / out of body feeling    Shellfish Allergy Diarrhea and Nausea And Vomiting   Statins Other (See Comments)    Muscle pains Other reaction(s): Other   Sulfamethoxazole Other (See Comments)    Mouth ulcers    Sulfites Itching and Other (See Comments)    Mouth ulcers   Ace Inhibitors Other (See Comments)    Other Reaction(s): Unknown   Fluconazole     Other reaction(s): Contact Dermatitis (intolerance)   Norvasc  [Amlodipine  Besylate] Swelling    Current Outpatient Medications  Medication Sig Dispense Refill   ACCU-CHEK GUIDE test strip 3 (three) times daily.     aspirin  EC 325 MG tablet Take 1 tablet (325 mg total) by mouth daily. 30 tablet 0   b complex-vitamin c -folic acid  (NEPHRO-VITE) 0.8 MG TABS tablet Take 1 tablet by mouth daily.     carvedilol  (COREG ) 12.5 MG tablet TAKE 1 TABLET BY MOUTH TWICE DAILY WITH A MEAL 180 tablet 3   cetirizine (ZYRTEC) 10 MG tablet Take 10 mg by mouth at bedtime.     Continuous Glucose Sensor (DEXCOM G7 SENSOR) MISC Inject 1 each into the skin as directed. Every 10 days change it.     Darbepoetin Alfa -Albumin (ARANESP  IJ) Inject as directed.     dexamethasone  (DECADRON ) 0.1 % ophthalmic solution 1 drop. Left ear daily.     diclofenac  Sodium (VOLTAREN ) 1 % GEL Apply 1 Application  topically daily.     diphenoxylate -atropine  (LOMOTIL ) 2.5-0.025 MG tablet TAKE 1 TABLET BY MOUTH 4 TIMES DAILY AS NEEDED FOR DIARRHEA OR  LOOSE  STOOLS 45 tablet 0   Doxercalciferol (HECTOROL IV) 2 mcg.     esomeprazole (NEXIUM) 20 MG capsule Take 20 mg by mouth every morning.      fluocinonide  (LIDEX ) 0.05 % external solution Apply 1 Application topically daily. 60 mL 0   Glucagon (GVOKE HYPOPEN 2-PACK) 0.5 MG/0.1ML SOAJ Use as needed in case of hypoglycemic emergency (Patient not taking: Reported on 09/13/2023)     hydrOXYzine  (ATARAX ) 10 MG tablet TAKE 1 TABLET BY MOUTH EVERY 6 HOURS AS NEEDED 90 tablet 0   ketoconazole  (NIZORAL ) 2 % shampoo Apply 1 Application topically 2 (two) times a week.     ketorolac  (ACULAR ) 0.5 % ophthalmic solution Place 1 drop into both eyes 2 (two) times daily.     Melatonin Gummies 2.5 MG CHEW Chew 2.5 mg by mouth at bedtime as needed (sleep).     Methoxy PEG-Epoetin  Beta (MIRCERA IJ) 75 mcg.     mirtazapine  (REMERON ) 30 MG tablet Take 30 mg by mouth at bedtime. (Patient not taking: Reported on 09/13/2023)     Multiple Vitamins-Minerals (MULTIVITAMIN WITH MINERALS) tablet Take 1 tablet by mouth daily. + Zinc     nitroGLYCERIN  (NITROLINGUAL ) 0.4 MG/SPRAY spray Place 1 spray under the tongue every 5 (five) minutes x 3 doses as needed. 12 g 3   NOVOLOG  100 UNIT/ML injection Inject 25 Units into the skin continuous. Insulin  pump     OVER THE COUNTER MEDICATION Take 1 capsule by mouth daily. Conjugated linoleic acid     OVER THE  COUNTER MEDICATION Take 1 tablet by mouth daily. Magnesium  Threonate     prednisoLONE acetate (PRED FORTE) 1 % ophthalmic suspension Place 1 drop into both eyes 2 (two) times daily.     PREMARIN  vaginal cream Place 1 applicator vaginally daily as needed (irritation).     thyroid  (ARMOUR THYROID ) 60 MG tablet Take 60 mg by mouth daily before breakfast.     torsemide  ([name] ) 20 MG tablet Take 2 tablets (40 mg total) by mouth daily. (Patient not  taking: Reported on 09/13/2023)     valACYclovir  (VALTREX ) 500 MG tablet Take 500 mg by mouth daily as needed (Flair up).     No current facility-administered medications for this visit.   Facility-Administered Medications Ordered in Other Visits  Medication Dose Route Frequency Provider Last Rate Last Admin   Darbepoetin Alfa  (ARANESP ) injection 300 mcg  300 mcg Subcutaneous Once Ennever, Maude SAUNDERS, MD        PHYSICAL EXAM There were no vitals filed for this visit.  NO distress Regular rate and rhythm Unlabored breathing Abdomen soft without significant scarring.  PERTINENT LABORATORY AND RADIOLOGIC DATA  Most recent CBC    Latest Ref Rng & Units 09/13/2023    1:43 PM 08/16/2023    1:11 PM 07/17/2023    3:05 PM  CBC  WBC 4.0 - 10.5 K/uL 7.5  9.0  6.6   Hemoglobin 12.0 - 15.0 g/dL 88.7  89.1  89.4   Hematocrit 36.0 - 46.0 % 34.4  32.6  31.1   Platelets 150 - 400 K/uL 299  278  301      Most recent CMP    Latest Ref Rng & Units 09/13/2023    1:43 PM 08/16/2023    1:11 PM 07/17/2023    3:05 PM  CMP  Glucose 70 - 99 mg/dL 703  843  843   BUN 8 - 23 mg/dL 38  38  77   Creatinine 0.44 - 1.00 mg/dL 6.95  7.48  6.22   Sodium 135 - 145 mmol/L 136  140  135   Potassium 3.5 - 5.1 mmol/L 4.8  4.5  4.5   Chloride 98 - 111 mmol/L 96  96  98   CO2 22 - 32 mmol/L 26  30  23    Calcium  8.9 - 10.3 mg/dL 9.5  9.6  9.1   Total Protein 6.5 - 8.1 g/dL 6.7  6.6  6.2   Total Bilirubin 0.0 - 1.2 mg/dL 0.3  0.4  0.4   Alkaline Phos 38 - 126 U/L 130  122  115   AST 15 - 41 U/L 28  30  21    ALT 0 - 44 U/L 15  16  23      Renal function CrCl cannot be calculated (Unknown ideal weight.).  Hemoglobin A1C (no units)  Date Value  05/20/2019 7.8%   Hgb A1c MFr Bld (%)  Date Value  12/24/2020 9.6 (H)    LDL Cholesterol (Calc)  Date Value Ref Range Status  07/20/2018 116 (H) mg/dL (calc) Final    Comment:    Reference range: <100 . Desirable range <100 mg/dL for primary prevention;   <70  mg/dL for patients with CHD or diabetic patients  with > or = 2 CHD risk factors. SABRA LDL-C is now calculated using the Martin-Hopkins  calculation, which is a validated novel method providing  better accuracy than the Friedewald equation in the  estimation of LDL-C.  Gladis APPLETHWAITE et al. SANDREA. 7986;689(80): [phone]  (  http://education.[employer].com/faq/FAQ164)      [name]. Magda, MD [facility] Phone Number: [phone] 10/02/2023 7:57 PM   Total time spent on preparing this encounter including chart review, data review, collecting history, examining the patient, coordinating care for this new patient, 60 minutes.  Portions of this report may have been transcribed using voice recognition software.  Every effort has been made to ensure accuracy; however, inadvertent computerized transcription errors may still be present.

## 2023-10-02 NOTE — H&P (View-Only) (Signed)
 [facility] [place]  ASSESSMENT / PLAN: Brittney Pace is a 68 y.o. right handed female in need of permanent dialysis access. She is in need of VP or VA shunt. PD catheter is not ideal in setting of VP shunt. TDC not ideal for VA shunt. Her house is not optimized for PD. Plan LUE AV access.   CHIEF COMPLAINT: renal failure  HISTORY OF PRESENT ILLNESS: Brittney Pace is a 68 y.o. female with ESRD on HD via RIJ TDC TTS. Patient presents to care for evaluation of PD catheter. The patient desires home based dialysis. She has had laparoscopic abdominal surgery only. She is right handed. We reviewed the technical details of lap PD catheter placement.  10/03/23: Patient returns to care to discuss dialysis access.  After some discussion between Dr. Melia, Dr. Louis, and myself, we have agreed to do hemodialysis at least in the near term.  I explained the rationale for this to the best my ability.  I think overall, the patient would still like to pursue peritoneal dialysis eventually.  As such, I encouraged her to discuss VA shunting with Dr. Louis.  Before we can do this, we will need to remove her tunneled dialysis catheter, and so I had suggested that we pursue a left arm graft at her convenience.  She is understanding and in agreement.  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   Fibromyalgia    Gastroparesis    GERD (gastroesophageal reflux disease)    Headache    migraines   Hypothyroidism    IBS (irritable bowel syndrome)    Idiopathic edema     Iron deficiency anemia 09/04/2019   Lyme disease    Mitral valve prolapse    Myocardial infarction (HCC)    1 major in 1999 and 2 minor  small vessel disease.   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  the second stroke was a lacunar     Past Surgical History:  Procedure Laterality Date   ABDOMINAL HYSTERECTOMY     APPENDECTOMY     BREAST SURGERY     B/L biopsy and lumpectomy    CARDIAC CATHETERIZATION     CARPAL TUNNEL RELEASE     CATARACT EXTRACTION W/ INTRAOCULAR LENS IMPLANT     right eye   COLONOSCOPY W/ BIOPSIES AND POLYPECTOMY     CORONARY ARTERY BYPASS GRAFT     coronary artery stents     at LAD and LIMA   DIALYSIS/PERMA CATHETER INSERTION N/A 03/24/2023   Procedure: DIALYSIS/PERMA CATHETER INSERTION;  Surgeon: Tobie Gordy POUR, MD;  Location: [facility] INVASIVE CV LAB;  Service: Cardiovascular;  Laterality: N/A;   ECTOPIC PREGNANCY SURGERY     IR FLUORO GUIDE CV LINE RIGHT  05/22/2023   NASAL SEPTUM  SURGERY     OPEN REDUCTION INTERNAL FIXATION (ORIF) DISTAL RADIAL FRACTURE Right 05/06/2015   Procedure: OPEN REDUCTION INTERNAL FIXATION (ORIF) RIGHT DISTAL RADIAL FRACTURE AND REPAIRS AS NEEDED;  Surgeon: Prentice Pagan, MD;  Location: [facility] OR;  Service: Orthopedics;  Laterality: Right;   ORIF HUMERUS FRACTURE Right 04/30/2020   Procedure: OPEN REDUCTION INTERNAL FIXATION (ORIF) PROXIMAL HUMERUS FRACTURE;  Surgeon: Melita Drivers, MD;  Location: [facility] ORS;  Service: Orthopedics;  Laterality: Right;    ORIF WRIST FRACTURE Left 11/05/2014   ORIF WRIST FRACTURE Left 11/05/2014   Procedure: OPEN REDUCTION INTERNAL FIXATION (ORIF) LEFT WRIST FRACTURE AND REPAIR AS INDICATED;  Surgeon: Prentice Pagan, MD;  Location: [facility] OR;  Service: Orthopedics;  Laterality: Left;   TRIGGER FINGER RELEASE     TUNNELLED CATHETER EXCHANGE N/A 06/01/2023   Procedure:  TUNNELLED CATHETER EXCHANGE;  Surgeon: Melia Lynwood ORN, MD;  Location: [facility] INVASIVE CV LAB;  Service: Cardiovascular;  Laterality: N/A;    Family History  Problem Relation Age of Onset   Breast cancer Mother    Migraines Mother    Heart disease Father    Hypertension Father    Breast cancer Sister     Social History   Socioeconomic History   Marital status: Married    Spouse name: Not on file   Number of children: 0   Years of education: college   Highest education level: Not on file  Occupational History   Occupation: Retired  Tobacco Use   Smoking status: Former    Current packs/day: 1.00    Average packs/day: 1 pack/day for 50.7 years (50.7 ttl pk-yrs)    Types: Cigarettes    Start date: 1975   Smokeless tobacco: Never   Tobacco comments:      Quit smoking cigarettes in 20's   Vaping Use   Vaping status: Never Used  Substance and Sexual Activity   Alcohol use: Not Currently    Alcohol/week: 7.0 standard drinks of alcohol    Types: 7 Cans of beer per week    Comment: beer nightly   Drug use: No   Sexual activity: Not Currently  Other Topics Concern   Not on file  Social History Narrative   Drinks 1-2  cups caffeine drinks a day    Right handed   Lives at home with husband and dog   Social Drivers of Corporate investment banker Strain: Not on file  Food Insecurity: Unknown (09/29/2023)   Received from [facility]   Hunger Vital Sign    Within the past 12 months, you worried that your food would run out before you got money to buy more: Patient declined to answer    Within the past 12 months, the food you bought just didn't last and you didn't have money to get more. : Patient declined to answer  Transportation Needs: Not on file (09/29/2023)  Physical Activity: Not on file  Stress: Not on file  Social Connections: Unknown (07/08/2022)   Received from [facility]   Social Network    Social Network: Not on file  Intimate Partner Violence: Unknown (07/08/2022)    Received from [facility]   HITS    Physically Hurt: Not on file    Insult or Talk Down To: Not on file    Threaten Physical Harm: Not on file    Scream or Curse: Not on file    Allergies  Allergen Reactions   Morphine  Anaphylaxis and Shortness Of Breath   Penicillins  Hives    Tolerated ANCEF  on 04/30/20 Has patient had a PCN reaction causing immediate rash, facial/tongue/throat swelling, SOB or lightheadedness with hypotension: Yes Has patient had a PCN reaction causing severe rash involving mucus membranes or skin necrosis: No Has patient had a PCN reaction that required hospitalization No Has patient had a PCN reaction occurring within the last 10 years: No If all of the above answers are NO, then may proceed with Cephalosporin use.   Tramadol Anaphylaxis   Acetaminophen  Other (See Comments)    Alters insulin  pump readings    Amitriptyline Other (See Comments)    Severe headache/ out of body feeling   Codeine Other (See Comments)    Severe headaches/ out of body feeling    Ibuprofen Other (See Comments)    Messes up CGM reading on glucose monitor     Krill Oil Diarrhea, Itching and Nausea And Vomiting   Lactose Intolerance (Gi) Diarrhea   Losartan Cough   Propoxyphene Other (See Comments)    Severe headaches / out of body feeling    Shellfish Allergy Diarrhea and Nausea And Vomiting   Statins Other (See Comments)    Muscle pains Other reaction(s): Other   Sulfamethoxazole Other (See Comments)    Mouth ulcers    Sulfites Itching and Other (See Comments)    Mouth ulcers   Ace Inhibitors Other (See Comments)    Other Reaction(s): Unknown   Fluconazole     Other reaction(s): Contact Dermatitis (intolerance)   Norvasc  [Amlodipine  Besylate] Swelling    Current Outpatient Medications  Medication Sig Dispense Refill   ACCU-CHEK GUIDE test strip 3 (three) times daily.     aspirin  EC 325 MG tablet Take 1 tablet (325 mg total) by mouth daily. 30 tablet 0   b  complex-vitamin c -folic acid  (NEPHRO-VITE) 0.8 MG TABS tablet Take 1 tablet by mouth daily.     carvedilol  (COREG ) 12.5 MG tablet TAKE 1 TABLET BY MOUTH TWICE DAILY WITH A MEAL 180 tablet 3   cetirizine (ZYRTEC) 10 MG tablet Take 10 mg by mouth at bedtime.     Continuous Glucose Sensor (DEXCOM G7 SENSOR) MISC Inject 1 each into the skin as directed. Every 10 days change it.     Darbepoetin Alfa -Albumin (ARANESP  IJ) Inject as directed.     dexamethasone  (DECADRON ) 0.1 % ophthalmic solution 1 drop. Left ear daily.     diclofenac  Sodium (VOLTAREN ) 1 % GEL Apply 1 Application topically daily.     diphenoxylate -atropine  (LOMOTIL ) 2.5-0.025 MG tablet TAKE 1 TABLET BY MOUTH 4 TIMES DAILY AS NEEDED FOR DIARRHEA OR  LOOSE  STOOLS 45 tablet 0   Doxercalciferol (HECTOROL IV) 2 mcg.     esomeprazole (NEXIUM) 20 MG capsule Take 20 mg by mouth every morning.      fluocinonide  (LIDEX ) 0.05 % external solution Apply 1 Application topically daily. 60 mL 0   Glucagon (GVOKE HYPOPEN 2-PACK) 0.5 MG/0.1ML SOAJ Use as needed in case of hypoglycemic emergency (Patient not taking: Reported on 09/13/2023)     hydrOXYzine  (ATARAX ) 10 MG tablet TAKE 1 TABLET BY MOUTH EVERY 6 HOURS AS NEEDED 90 tablet 0   ketoconazole  (NIZORAL ) 2 % shampoo Apply 1 Application topically 2 (two) times a week.     ketorolac  (ACULAR ) 0.5 % ophthalmic solution Place 1 drop into both eyes 2 (two) times daily.     Melatonin Gummies 2.5 MG CHEW Chew 2.5 mg by mouth at bedtime as needed (sleep).     Methoxy PEG-Epoetin  Beta (  MIRCERA IJ) 75 mcg.     mirtazapine  (REMERON ) 30 MG tablet Take 30 mg by mouth at bedtime. (Patient not taking: Reported on 09/13/2023)     Multiple Vitamins-Minerals (MULTIVITAMIN WITH MINERALS) tablet Take 1 tablet by mouth daily. + Zinc     nitroGLYCERIN  (NITROLINGUAL ) 0.4 MG/SPRAY spray Place 1 spray under the tongue every 5 (five) minutes x 3 doses as needed. 12 g 3   NOVOLOG  100 UNIT/ML injection Inject 25 Units into the  skin continuous. Insulin  pump     OVER THE COUNTER MEDICATION Take 1 capsule by mouth daily. Conjugated linoleic acid     OVER THE COUNTER MEDICATION Take 1 tablet by mouth daily. Magnesium  Threonate     prednisoLONE acetate (PRED FORTE) 1 % ophthalmic suspension Place 1 drop into both eyes 2 (two) times daily.     PREMARIN  vaginal cream Place 1 applicator vaginally daily as needed (irritation).     thyroid  (ARMOUR THYROID ) 60 MG tablet Take 60 mg by mouth daily before breakfast.     torsemide  ([name] ) 20 MG tablet Take 2 tablets (40 mg total) by mouth daily. (Patient not taking: Reported on 09/13/2023)     valACYclovir  (VALTREX ) 500 MG tablet Take 500 mg by mouth daily as needed (Flair up).     No current facility-administered medications for this visit.   Facility-Administered Medications Ordered in Other Visits  Medication Dose Route Frequency Provider Last Rate Last Admin   Darbepoetin Alfa  (ARANESP ) injection 300 mcg  300 mcg Subcutaneous Once Ennever, Maude SAUNDERS, MD        PHYSICAL EXAM There were no vitals filed for this visit.  NO distress Regular rate and rhythm Unlabored breathing  PERTINENT LABORATORY AND RADIOLOGIC DATA  Most recent CBC    Latest Ref Rng & Units 09/13/2023    1:43 PM 08/16/2023    1:11 PM 07/17/2023    3:05 PM  CBC  WBC 4.0 - 10.5 K/uL 7.5  9.0  6.6   Hemoglobin 12.0 - 15.0 g/dL 88.7  89.1  89.4   Hematocrit 36.0 - 46.0 % 34.4  32.6  31.1   Platelets 150 - 400 K/uL 299  278  301      Most recent CMP    Latest Ref Rng & Units 09/13/2023    1:43 PM 08/16/2023    1:11 PM 07/17/2023    3:05 PM  CMP  Glucose 70 - 99 mg/dL 703  843  843   BUN 8 - 23 mg/dL 38  38  77   Creatinine 0.44 - 1.00 mg/dL 6.95  7.48  6.22   Sodium 135 - 145 mmol/L 136  140  135   Potassium 3.5 - 5.1 mmol/L 4.8  4.5  4.5   Chloride 98 - 111 mmol/L 96  96  98   CO2 22 - 32 mmol/L 26  30  23    Calcium  8.9 - 10.3 mg/dL 9.5  9.6  9.1   Total Protein 6.5 - 8.1 g/dL 6.7  6.6  6.2    Total Bilirubin 0.0 - 1.2 mg/dL 0.3  0.4  0.4   Alkaline Phos 38 - 126 U/L 130  122  115   AST 15 - 41 U/L 28  30  21    ALT 0 - 44 U/L 15  16  23      Renal function CrCl cannot be calculated (Unknown ideal weight.).  Hemoglobin A1C (no units)  Date Value  05/20/2019 7.8%   Hgb A1c MFr Bld (%)  Date Value  12/24/2020 9.6 (H)    LDL Cholesterol (Calc)  Date Value Ref Range Status  07/20/2018 116 (H) mg/dL (calc) Final    Comment:    Reference range: <100 . Desirable range <100 mg/dL for primary prevention;   <70 mg/dL for patients with CHD or diabetic patients  with > or = 2 CHD risk factors. SABRA LDL-C is now calculated using the Martin-Hopkins  calculation, which is a validated novel method providing  better accuracy than the Friedewald equation in the  estimation of LDL-C.  Gladis APPLETHWAITE et al. SANDREA. 7986;689(80): 2061-2068  (http://education.[employer].com/faq/FAQ164)     Preoperative vein mapping done today shows no suitable superficial vein for AV fistula creation  [name]. Magda, MD FACS [facility] Phone Number: [phone] 10/02/2023 7:57 PM   Total time spent on preparing this encounter including chart review, data review, collecting history, examining the patient, coordinating care for this established patient, 30 minutes  Portions of this report may have been transcribed using voice recognition software.  Every effort has been made to ensure accuracy; however, inadvertent computerized transcription errors may still be present.

## 2023-10-03 ENCOUNTER — Ambulatory Visit (HOSPITAL_COMMUNITY)
Admission: RE | Admit: 2023-10-03 | Discharge: 2023-10-03 | Disposition: A | Discharge status: Home / Self Care | Source: Ambulatory Visit | Attending: Vascular Surgery | Admitting: Vascular Surgery

## 2023-10-03 ENCOUNTER — Other Ambulatory Visit: Payer: Self-pay

## 2023-10-03 ENCOUNTER — Ambulatory Visit (INDEPENDENT_AMBULATORY_CARE_PROVIDER_SITE_OTHER): Admitting: Vascular Surgery

## 2023-10-03 ENCOUNTER — Encounter: Payer: Self-pay | Admitting: Vascular Surgery

## 2023-10-03 VITALS — BP 165/72 | HR 64 | Temp 97.6°F | Ht <= 58 in | Wt 110.8 lb

## 2023-10-03 DIAGNOSIS — N186 End stage renal disease: Secondary | ICD-10-CM | POA: Insufficient documentation

## 2023-10-03 DIAGNOSIS — Z992 Dependence on renal dialysis: Secondary | ICD-10-CM | POA: Diagnosis not present

## 2023-10-10 ENCOUNTER — Encounter (HOSPITAL_COMMUNITY): Payer: Self-pay | Admitting: Vascular Surgery

## 2023-10-10 ENCOUNTER — Other Ambulatory Visit: Payer: Self-pay

## 2023-10-10 NOTE — Anesthesia Preprocedure Evaluation (Addendum)
 Anesthesia Evaluation  Patient identified by MRN, date of birth, ID band Patient awake    Reviewed: Allergy & Precautions, NPO status , Patient's Chart, lab work & pertinent test results  History of Anesthesia Complications (+) PONV and history of anesthetic complications  Airway Mallampati: I  TM Distance: >3 FB Neck ROM: Full    Dental  (+) Dental Advisory Given   Pulmonary sleep apnea , former smoker   breath sounds clear to auscultation       Cardiovascular + Peripheral Vascular Disease   Rhythm:Regular Rate:Normal  TTE (2024)  1. Left ventricular ejection fraction, by estimation, is 55 to 60%. The  left ventricle has normal function. The left ventricle has no regional  wall motion abnormalities. Left ventricular diastolic parameters were  normal.   2. Right ventricular systolic function is mildly reduced. The right  ventricular size is mildly enlarged. There is mildly elevated pulmonary  artery systolic pressure. The estimated right ventricular systolic  pressure is 42.8 mmHg.   3. The mitral valve is degenerative. Trivial mitral valve regurgitation.   4. The aortic valve is tricuspid. There is mild calcification of the  aortic valve. Aortic valve regurgitation is not visualized. Aortic valve  sclerosis is present, with no evidence of aortic valve stenosis. Aortic  valve mean gradient measures 5.0 mmHg.   5. The inferior vena cava is normal in size with <50% respiratory  variability, suggesting right atrial pressure of 8 mmHg.     Neuro/Psych CVA    GI/Hepatic ,GERD  ,,Severe gastroparesis   Endo/Other  diabetesHypothyroidism    Renal/GU Renal disease     Musculoskeletal  (+) Arthritis ,    Abdominal   Peds  Hematology   Anesthesia Other Findings   Reproductive/Obstetrics                              Anesthesia Physical Anesthesia Plan  ASA: 3  Anesthesia Plan: General    Post-op Pain Management:    Induction: Intravenous  PONV Risk Score and Plan: 2  Airway Management Planned: Simple Face Mask  Additional Equipment:   Intra-op Plan:   Post-operative Plan:   Informed Consent:      Dental advisory given  Plan Discussed with: CRNA and Surgeon  Anesthesia Plan Comments: (PAT note by Lynwood Hope, PA-C: 68 year old female with pertinent history including former smoker, gastroparesis, GERD on PPI, CAD s/p CABG x 2 in 1999, anemia secondary to CKD (maintained on Aranesp  for Hgb <11), ESRD on HD via right IJ TDC TTS, type 1 diabetes (A1c 7.0 on 08/07/2023), hypothyroidism, chronic pain, migraines, multiple CVA, normal pressure hydrocephalus (being evaluated by neurosurgery for VP shunt; needs TDC removed before she can proceed with this).  Follows with cardiology for history of CAD.  She had a prior MI in 1999 with subsequent CABG (LIMA to LAD; SVG to RCA) and reported subsequent stenting of LIMA graft in 2000. Her last heart catheterization was August 2015 at [facility] which revealed two-vessel coronary disease with a patent LIMA to the LAD and SVG to the RCA. No obstructive disease in left circumflex; EF 65. Echocardiogram February 2024 showed normal LV function, mild RV dysfunction, mild right ventricular enlargement.  Carotid Dopplers December 2024 showed no evidence of hemodynamically significant stenosis.  Last seen by Dr. Pietro on 04/12/2023 for routine follow-up.  Stable at that time from cardiac standpoint, no changes to management, discussed that she may eventually need to  reduce blood pressure medications now that she is on dialysis.  1 year follow-up recommended.  CMP and CBC 09/13/2023 reviewed, glucose elevated at 296, creatinine elevated 3.04 consistent with ESRD, mild anemia with hemoglobin 11.2, otherwise unremarkable.  She will need day of surgery labs and evaluation.  EKG 05/20/2023: Sinus rhythm.  Rate 66. Borderline prolonged PR  interval. Right axis deviation. Borderline low voltage, extremity leads  TTE 03/19/22: 1. Left ventricular ejection fraction, by estimation, is 55 to 60%. The  left ventricle has normal function. The left ventricle has no regional  wall motion abnormalities. Left ventricular diastolic parameters were  normal.  2. Right ventricular systolic function is mildly reduced. The right  ventricular size is mildly enlarged. There is mildly elevated pulmonary  artery systolic pressure. The estimated right ventricular systolic  pressure is 42.8 mmHg.  3. The mitral valve is degenerative. Trivial mitral valve regurgitation.  4. The aortic valve is tricuspid. There is mild calcification of the  aortic valve. Aortic valve regurgitation is not visualized. Aortic valve  sclerosis is present, with no evidence of aortic valve stenosis. Aortic  valve mean gradient measures 5.0 mmHg.  5. The inferior vena cava is normal in size with <50% respiratory  variability, suggesting right atrial pressure of 8 mmHg.     Given patient's severe gastroparesis, will plan for RSI GETA.  )         Anesthesia Quick Evaluation

## 2023-10-10 NOTE — Progress Notes (Signed)
 Anesthesia Chart Review: Same day workup  68 year old female with pertinent history including former smoker, gastroparesis, GERD on PPI, CAD s/p CABG x 2 in 1999, anemia secondary to CKD (maintained on Aranesp  for Hgb <11), ESRD on HD via right IJ TDC TTS, type 1 diabetes (A1c 7.0 on 08/07/2023), hypothyroidism, chronic pain, migraines, multiple CVA, normal pressure hydrocephalus (being evaluated by neurosurgery for VP shunt; needs TDC removed before she can proceed with this).  Follows with cardiology for history of CAD.  She had a prior MI in 1999 with subsequent CABG (LIMA to LAD; SVG to RCA) and reported subsequent stenting of LIMA graft in 2000. Her last heart catheterization was August 2015 at [facility] which revealed two-vessel coronary disease with a patent LIMA to the LAD and SVG to the RCA. No obstructive disease in left circumflex; EF 65. Echocardiogram February 2024 showed normal LV function, mild RV dysfunction, mild right ventricular enlargement.  Carotid Dopplers December 2024 showed no evidence of hemodynamically significant stenosis.  Last seen by Dr. Pietro on 04/12/2023 for routine follow-up.  Stable at that time from cardiac standpoint, no changes to management, discussed that she may eventually need to reduce blood pressure medications now that she is on dialysis.  1 year follow-up recommended.  CMP and CBC 09/13/2023 reviewed, glucose elevated at 296, creatinine elevated 3.04 consistent with ESRD, mild anemia with hemoglobin 11.2, otherwise unremarkable.  She will need day of surgery labs and evaluation.  EKG 05/20/2023: Sinus rhythm.  Rate 66. Borderline prolonged PR interval. Right axis deviation. Borderline low voltage, extremity leads  TTE 03/19/22: 1. Left ventricular ejection fraction, by estimation, is 55 to 60%. The  left ventricle has normal function. The left ventricle has no regional  wall motion abnormalities. Left ventricular diastolic parameters were  normal.    2. Right ventricular systolic function is mildly reduced. The right  ventricular size is mildly enlarged. There is mildly elevated pulmonary  artery systolic pressure. The estimated right ventricular systolic  pressure is 42.8 mmHg.   3. The mitral valve is degenerative. Trivial mitral valve regurgitation.   4. The aortic valve is tricuspid. There is mild calcification of the  aortic valve. Aortic valve regurgitation is not visualized. Aortic valve  sclerosis is present, with no evidence of aortic valve stenosis. Aortic  valve mean gradient measures 5.0 mmHg.   5. The inferior vena cava is normal in size with <50% respiratory  variability, suggesting right atrial pressure of 8 mmHg.      Lynwood Geofm RIGGERS [facility] Short Stay Center/Anesthesiology Phone [phone] 10/10/2023 10:45 AM

## 2023-10-10 NOTE — Progress Notes (Signed)
 SDW call  Patient was given pre-op  instructions over the phone. Patient verbalized understanding of instructions provided.     PCP - Dr. Maude Crease Cardiologist - Dr. Redell Shallow Nephrology:  Riccardo, [facility]. Dialysis T, TH, S. Patient did not make it to dialysis today Pulmonary:    PPM/ICD - denies Device Orders -  Rep Notified -    Chest x-ray - na EKG -  05/22/2023 Stress Test -  ECHO - 03/19/2022 Cardiac Cath - 2015  Sleep Study/sleep apnea/CPAP: diagnosed with sleep apnea, does not wear a CPAP  Type 1 diabetic. A1C 7.0 on 08/07/2023. CGM Dexcom to left arm.  Has an insulin  pump Fasting Blood sugar range: [phone] How often check sugars: continuous Instructed to reduce basal rate of insulin  pump by 20% at midnight   Blood Thinner Instructions: denies Aspirin  Instructions:denies   ERAS Protcol - NPO  Anesthesia review: Yes. DM type 1, CAD, MI, stroke, CHF, PVD, ESRD with dialysis, OSA   Patient denies shortness of breath, fever, cough and chest pain over the phone call  Your procedure is scheduled on Wednesday October 11, 2023  Report to [facility] Main Entrance A at  0600  A.M., then check in with the Admitting office.  Call this number if you have problems the morning of surgery:  [phone]   If you have any questions prior to your surgery date call [phone]: Open Monday-Friday 8am-4pm If you experience any cold or flu symptoms such as cough, fever, chills, shortness of breath, etc. between now and your scheduled surgery, please notify us  at the above number    Remember:  Do not eat or drink after midnight the night before your surgery  Take these medicines the morning of surgery with A SIP OF WATER:  ASA, carvedilol , nexium, eye drops, armour thyroid   As needed: Hydroxyzine , valtrex   As of today, STOP taking any Aspirin  (unless otherwise instructed by your surgeon) Aleve, Naproxen, Ibuprofen, Motrin, Advil, Goody's, BC's, all herbal  medications, fish oil, and all vitamins.

## 2023-10-11 ENCOUNTER — Ambulatory Visit (HOSPITAL_COMMUNITY): Payer: Self-pay | Admitting: Physician Assistant

## 2023-10-11 ENCOUNTER — Other Ambulatory Visit: Payer: Self-pay

## 2023-10-11 ENCOUNTER — Encounter (HOSPITAL_COMMUNITY)
Admission: RE | Disposition: A | Discharge status: Home / Self Care | Payer: Self-pay | Source: Home / Self Care | Attending: Vascular Surgery

## 2023-10-11 ENCOUNTER — Encounter (HOSPITAL_COMMUNITY): Payer: Self-pay | Admitting: Vascular Surgery

## 2023-10-11 ENCOUNTER — Ambulatory Visit (HOSPITAL_COMMUNITY)
Admission: RE | Admit: 2023-10-11 | Discharge: 2023-10-11 | Disposition: A | Discharge status: Home / Self Care | Source: Home / Self Care | Attending: Vascular Surgery | Admitting: Vascular Surgery

## 2023-10-11 DIAGNOSIS — Z8673 Personal history of transient ischemic attack (TIA), and cerebral infarction without residual deficits: Secondary | ICD-10-CM | POA: Insufficient documentation

## 2023-10-11 DIAGNOSIS — Z955 Presence of coronary angioplasty implant and graft: Secondary | ICD-10-CM | POA: Insufficient documentation

## 2023-10-11 DIAGNOSIS — M1990 Unspecified osteoarthritis, unspecified site: Secondary | ICD-10-CM | POA: Insufficient documentation

## 2023-10-11 DIAGNOSIS — Z87891 Personal history of nicotine dependence: Secondary | ICD-10-CM

## 2023-10-11 DIAGNOSIS — Z79899 Other long term (current) drug therapy: Secondary | ICD-10-CM | POA: Insufficient documentation

## 2023-10-11 DIAGNOSIS — G4730 Sleep apnea, unspecified: Secondary | ICD-10-CM | POA: Insufficient documentation

## 2023-10-11 DIAGNOSIS — Z794 Long term (current) use of insulin: Secondary | ICD-10-CM | POA: Insufficient documentation

## 2023-10-11 DIAGNOSIS — D631 Anemia in chronic kidney disease: Secondary | ICD-10-CM | POA: Insufficient documentation

## 2023-10-11 DIAGNOSIS — Z951 Presence of aortocoronary bypass graft: Secondary | ICD-10-CM | POA: Insufficient documentation

## 2023-10-11 DIAGNOSIS — I2510 Atherosclerotic heart disease of native coronary artery without angina pectoris: Secondary | ICD-10-CM | POA: Insufficient documentation

## 2023-10-11 DIAGNOSIS — I5033 Acute on chronic diastolic (congestive) heart failure: Secondary | ICD-10-CM

## 2023-10-11 DIAGNOSIS — K3184 Gastroparesis: Secondary | ICD-10-CM | POA: Insufficient documentation

## 2023-10-11 DIAGNOSIS — Z8249 Family history of ischemic heart disease and other diseases of the circulatory system: Secondary | ICD-10-CM | POA: Insufficient documentation

## 2023-10-11 DIAGNOSIS — E039 Hypothyroidism, unspecified: Secondary | ICD-10-CM | POA: Insufficient documentation

## 2023-10-11 DIAGNOSIS — E1051 Type 1 diabetes mellitus with diabetic peripheral angiopathy without gangrene: Secondary | ICD-10-CM | POA: Insufficient documentation

## 2023-10-11 DIAGNOSIS — G8929 Other chronic pain: Secondary | ICD-10-CM | POA: Insufficient documentation

## 2023-10-11 DIAGNOSIS — I132 Hypertensive heart and chronic kidney disease with heart failure and with stage 5 chronic kidney disease, or end stage renal disease: Secondary | ICD-10-CM

## 2023-10-11 DIAGNOSIS — E1043 Type 1 diabetes mellitus with diabetic autonomic (poly)neuropathy: Secondary | ICD-10-CM | POA: Insufficient documentation

## 2023-10-11 DIAGNOSIS — N186 End stage renal disease: Secondary | ICD-10-CM | POA: Diagnosis not present

## 2023-10-11 DIAGNOSIS — I252 Old myocardial infarction: Secondary | ICD-10-CM | POA: Insufficient documentation

## 2023-10-11 DIAGNOSIS — T8140XA Infection following a procedure, unspecified, initial encounter: Secondary | ICD-10-CM | POA: Diagnosis not present

## 2023-10-11 DIAGNOSIS — E1022 Type 1 diabetes mellitus with diabetic chronic kidney disease: Secondary | ICD-10-CM | POA: Insufficient documentation

## 2023-10-11 DIAGNOSIS — Z992 Dependence on renal dialysis: Secondary | ICD-10-CM | POA: Insufficient documentation

## 2023-10-11 DIAGNOSIS — K219 Gastro-esophageal reflux disease without esophagitis: Secondary | ICD-10-CM | POA: Insufficient documentation

## 2023-10-11 DIAGNOSIS — R509 Fever, unspecified: Secondary | ICD-10-CM | POA: Diagnosis not present

## 2023-10-11 HISTORY — DX: Other complications of anesthesia, initial encounter: T88.59XA

## 2023-10-11 HISTORY — DX: Nausea with vomiting, unspecified: R11.2

## 2023-10-11 HISTORY — DX: Sleep apnea, unspecified: G47.30

## 2023-10-11 HISTORY — PX: INSERTION OF ARTERIOVENOUS (AV) ARTEGRAFT ARM: SHX6779

## 2023-10-11 HISTORY — DX: Other specified postprocedural states: Z98.890

## 2023-10-11 HISTORY — DX: Dependence on renal dialysis: Z99.2

## 2023-10-11 HISTORY — DX: End stage renal disease: N18.6

## 2023-10-11 LAB — POCT I-STAT, CHEM 8
BUN: 63 mg/dL — ABNORMAL HIGH (ref 8–23)
Calcium, Ion: 1.14 mmol/L — ABNORMAL LOW (ref 1.15–1.40)
Chloride: 102 mmol/L (ref 98–111)
Creatinine, Ser: 3.5 mg/dL — ABNORMAL HIGH (ref 0.44–1.00)
Glucose, Bld: 153 mg/dL — ABNORMAL HIGH (ref 70–99)
HCT: 27 % — ABNORMAL LOW (ref 36.0–46.0)
Hemoglobin: 9.2 g/dL — ABNORMAL LOW (ref 12.0–15.0)
Potassium: 4.4 mmol/L (ref 3.5–5.1)
Sodium: 135 mmol/L (ref 135–145)
TCO2: 24 mmol/L (ref 22–32)

## 2023-10-11 LAB — GLUCOSE, CAPILLARY
Glucose-Capillary: 126 mg/dL — ABNORMAL HIGH (ref 70–99)
Glucose-Capillary: 180 mg/dL — ABNORMAL HIGH (ref 70–99)
Glucose-Capillary: 231 mg/dL — ABNORMAL HIGH (ref 70–99)

## 2023-10-11 SURGERY — INSERTION, GRAFT, ARTERIOVENOUS, UPPER EXTREMITY
Anesthesia: General | Site: Arm Upper | Laterality: Left

## 2023-10-11 MED ORDER — SODIUM CHLORIDE 0.9 % IV SOLN: INTRAVENOUS | Status: DC

## 2023-10-11 MED ORDER — PHENYLEPHRINE 80 MCG/ML (10ML) SYRINGE FOR IV PUSH (FOR BLOOD PRESSURE SUPPORT)
PREFILLED_SYRINGE | INTRAVENOUS | Status: DC | PRN
  Administered 2023-10-11: 80 ug via INTRAVENOUS

## 2023-10-11 MED ORDER — MIDAZOLAM HCL 2 MG/2ML IJ SOLN
INTRAMUSCULAR | Status: AC
  Filled 2023-10-11: qty 2

## 2023-10-11 MED ORDER — HEPARIN 6000 UNIT IRRIGATION SOLUTION
Status: DC | PRN
  Administered 2023-10-11: 1

## 2023-10-11 MED ORDER — INSULIN ASPART 100 UNIT/ML IJ SOLN
2.0000 [IU] | Freq: Once | INTRAMUSCULAR | Status: AC
  Administered 2023-10-11: 2 [IU] via SUBCUTANEOUS

## 2023-10-11 MED ORDER — LIDOCAINE-EPINEPHRINE (PF) 1 %-1:200000 IJ SOLN
INTRAMUSCULAR | Status: AC
  Filled 2023-10-11: qty 30

## 2023-10-11 MED ORDER — 0.9 % SODIUM CHLORIDE (POUR BTL) OPTIME
TOPICAL | Status: DC | PRN
  Administered 2023-10-11: 1000 mL

## 2023-10-11 MED ORDER — DROPERIDOL 2.5 MG/ML IJ SOLN: 0.6250 mg | Freq: Once | INTRAMUSCULAR | Status: DC | PRN

## 2023-10-11 MED ORDER — HEPARIN 6000 UNIT IRRIGATION SOLUTION
Status: AC
Start: 2023-10-11 — End: 2023-10-11
  Filled 2023-10-11: qty 500

## 2023-10-11 MED ORDER — FENTANYL CITRATE (PF) 100 MCG/2ML IJ SOLN
INTRAMUSCULAR | Status: AC
  Filled 2023-10-11: qty 2

## 2023-10-11 MED ORDER — ONDANSETRON HCL 4 MG/2ML IJ SOLN
INTRAMUSCULAR | Status: DC | PRN
  Administered 2023-10-11: 4 mg via INTRAVENOUS

## 2023-10-11 MED ORDER — CHLORHEXIDINE GLUCONATE 4 % EX SOLN: 60.0000 mL | Freq: Once | CUTANEOUS | Status: DC

## 2023-10-11 MED ORDER — FENTANYL CITRATE (PF) 250 MCG/5ML IJ SOLN
INTRAMUSCULAR | Status: DC | PRN
  Administered 2023-10-11: 50 ug via INTRAVENOUS

## 2023-10-11 MED ORDER — OXYCODONE HCL 5 MG PO TABS: 5.0000 mg | ORAL_TABLET | Freq: Three times a day (TID) | ORAL | 0 refills | Status: DC | PRN

## 2023-10-11 MED ORDER — INSULIN ASPART 100 UNIT/ML IJ SOLN: 0.0000 [IU] | INTRAMUSCULAR | Status: DC | PRN

## 2023-10-11 MED ORDER — HEPARIN SODIUM (PORCINE) 1000 UNIT/ML IJ SOLN
INTRAMUSCULAR | Status: DC | PRN
Start: 2023-10-11 — End: 2023-10-11
  Administered 2023-10-11: 5000 [IU] via INTRAVENOUS

## 2023-10-11 MED ORDER — OXYCODONE HCL 5 MG/5ML PO SOLN: 5.0000 mg | Freq: Once | ORAL | Status: AC | PRN

## 2023-10-11 MED ORDER — ONDANSETRON HCL 4 MG/2ML IJ SOLN: 4.0000 mg | Freq: Once | INTRAMUSCULAR | Status: DC | PRN

## 2023-10-11 MED ORDER — LIDOCAINE 2% (20 MG/ML) 5 ML SYRINGE
INTRAMUSCULAR | Status: AC
Start: 2023-10-11 — End: 2023-10-11
  Filled 2023-10-11: qty 5

## 2023-10-11 MED ORDER — SUGAMMADEX SODIUM 200 MG/2ML IV SOLN
INTRAVENOUS | Status: DC | PRN
  Administered 2023-10-11: 200 mg via INTRAVENOUS

## 2023-10-11 MED ORDER — HEMOSTATIC AGENTS (NO CHARGE) OPTIME
TOPICAL | Status: DC | PRN
Start: 2023-10-11 — End: 2023-10-11
  Administered 2023-10-11: 1 via TOPICAL

## 2023-10-11 MED ORDER — CLOPIDOGREL BISULFATE 75 MG PO TABS: 75.0000 mg | ORAL_TABLET | Freq: Every day | ORAL | 11 refills | Status: AC

## 2023-10-11 MED ORDER — LIDOCAINE HCL (PF) 1 % IJ SOLN
INTRAMUSCULAR | Status: AC
  Filled 2023-10-11: qty 30

## 2023-10-11 MED ORDER — PROPOFOL 500 MG/50ML IV EMUL
INTRAVENOUS | Status: DC | PRN
  Administered 2023-10-11: 100 mg via INTRAVENOUS

## 2023-10-11 MED ORDER — ORAL CARE MOUTH RINSE: 15.0000 mL | Freq: Once | OROMUCOSAL | Status: AC

## 2023-10-11 MED ORDER — CHLORHEXIDINE GLUCONATE 0.12 % MT SOLN
15.0000 mL | Freq: Once | OROMUCOSAL | Status: AC
  Administered 2023-10-11: 15 mL via OROMUCOSAL
  Filled 2023-10-11: qty 15

## 2023-10-11 MED ORDER — ROCURONIUM BROMIDE 10 MG/ML (PF) SYRINGE
PREFILLED_SYRINGE | INTRAVENOUS | Status: AC
Start: 2023-10-11 — End: 2023-10-11
  Filled 2023-10-11: qty 10

## 2023-10-11 MED ORDER — EPHEDRINE SULFATE-NACL 50-0.9 MG/10ML-% IV SOSY
PREFILLED_SYRINGE | INTRAVENOUS | Status: DC | PRN
  Administered 2023-10-11: 5 mg via INTRAVENOUS
  Administered 2023-10-11: 10 mg via INTRAVENOUS

## 2023-10-11 MED ORDER — FENTANYL CITRATE (PF) 100 MCG/2ML IJ SOLN
25.0000 ug | INTRAMUSCULAR | Status: DC | PRN
  Administered 2023-10-11: 25 ug via INTRAVENOUS

## 2023-10-11 MED ORDER — MIDAZOLAM HCL 2 MG/2ML IJ SOLN
INTRAMUSCULAR | Status: DC | PRN
  Administered 2023-10-11: 1 mg via INTRAVENOUS

## 2023-10-11 MED ORDER — VANCOMYCIN HCL IN DEXTROSE 1-5 GM/200ML-% IV SOLN
1000.0000 mg | INTRAVENOUS | Status: AC
  Administered 2023-10-11: 1000 mg via INTRAVENOUS
  Filled 2023-10-11: qty 200

## 2023-10-11 MED ORDER — ROCURONIUM BROMIDE 100 MG/10ML IV SOLN
INTRAVENOUS | Status: DC | PRN
  Administered 2023-10-11: 50 mg via INTRAVENOUS

## 2023-10-11 MED ORDER — LIDOCAINE 2% (20 MG/ML) 5 ML SYRINGE
INTRAMUSCULAR | Status: DC | PRN
  Administered 2023-10-11: 60 mg via INTRAVENOUS

## 2023-10-11 MED ORDER — OXYCODONE HCL 5 MG PO TABS
ORAL_TABLET | ORAL | Status: AC
  Filled 2023-10-11: qty 1

## 2023-10-11 MED ORDER — FENTANYL CITRATE (PF) 250 MCG/5ML IJ SOLN
INTRAMUSCULAR | Status: AC
  Filled 2023-10-11: qty 5

## 2023-10-11 MED ORDER — OXYCODONE HCL 5 MG PO TABS
5.0000 mg | ORAL_TABLET | Freq: Once | ORAL | Status: AC | PRN
  Administered 2023-10-11: 5 mg via ORAL

## 2023-10-11 SURGICAL SUPPLY — 29 items
ARMBAND PINK RESTRICT EXTREMIT (MISCELLANEOUS) ×1 IMPLANT
BENZOIN TINCTURE PRP APPL 2/3 (GAUZE/BANDAGES/DRESSINGS) ×1 IMPLANT
CANISTER SUCTION 3000ML PPV (SUCTIONS) ×1 IMPLANT
CANNULA VESSEL 3MM 2 BLNT TIP (CANNULA) ×1 IMPLANT
CHLORAPREP W/TINT 26 (MISCELLANEOUS) ×1 IMPLANT
CLIP LIGATING EXTRA MED SLVR (CLIP) ×1 IMPLANT
CLIP LIGATING EXTRA SM BLUE (MISCELLANEOUS) ×1 IMPLANT
DRSG TEGADERM 4X4.75 (GAUZE/BANDAGES/DRESSINGS) IMPLANT
ELECTRODE REM PT RTRN 9FT ADLT (ELECTROSURGICAL) ×1 IMPLANT
GAUZE SPONGE 4X4 12PLY STRL (GAUZE/BANDAGES/DRESSINGS) IMPLANT
GLOVE BIO SURGEON STRL SZ8 (GLOVE) ×1 IMPLANT
GOWN STRL REUS W/ TWL XL LVL3 (GOWN DISPOSABLE) ×1 IMPLANT
GRAFT GORETEX STRT 4-7X45 (Vascular Products) IMPLANT
HEMOSTAT SNOW SURGICEL 2X4 (HEMOSTASIS) IMPLANT
KIT BASIN OR (CUSTOM PROCEDURE TRAY) ×1 IMPLANT
KIT TURNOVER KIT B (KITS) ×1 IMPLANT
LOOP VESSEL MINI RED (MISCELLANEOUS) IMPLANT
NS IRRIG 1000ML POUR BTL (IV SOLUTION) ×1 IMPLANT
PACK CV ACCESS (CUSTOM PROCEDURE TRAY) ×1 IMPLANT
PAD ARMBOARD POSITIONER FOAM (MISCELLANEOUS) ×2 IMPLANT
SHEATH PROBE COVER 6X72 (BAG) ×1 IMPLANT
STRIP CLOSURE SKIN 1/2X4 (GAUZE/BANDAGES/DRESSINGS) ×1 IMPLANT
SUT MNCRL AB 4-0 PS2 18 (SUTURE) IMPLANT
SUT PROLENE 6 0 BV (SUTURE) IMPLANT
SUT SILK 2 0 SH (SUTURE) IMPLANT
SUT VIC AB 3-0 SH 27X BRD (SUTURE) ×2 IMPLANT
TOWEL GREEN STERILE (TOWEL DISPOSABLE) ×1 IMPLANT
UNDERPAD 30X36 HEAVY ABSORB (UNDERPADS AND DIAPERS) ×1 IMPLANT
WATER STERILE IRR 1000ML POUR (IV SOLUTION) ×1 IMPLANT

## 2023-10-11 NOTE — Op Note (Signed)
 DATE OF SERVICE: 10/11/2023  PATIENT:  Brittney Pace  68 y.o. female  PRE-OPERATIVE DIAGNOSIS:  ESRD on HD   POST-OPERATIVE DIAGNOSIS:  Same  PROCEDURE:   Left arm upper arm brachial --> axillary arteriovenous graft (CPT [phone])  SURGEON:  Surgeons and Role:    * Magda [name], MD - Primary  ASSISTANT: none  ANESTHESIA:   general  EBL: 50mL  BLOOD ADMINISTERED:none  DRAINS: none   LOCAL MEDICATIONS USED:  NONE  SPECIMEN:  none  COUNTS: confirmed correct.  TOURNIQUET:  none  PATIENT DISPOSITION:  PACU - hemodynamically stable.   Delay start of Pharmacological VTE agent (>24hrs) due to surgical blood loss or risk of bleeding: no  INDICATION FOR PROCEDURE: Brittney Pace is a 68 y.o. female with ESRD on HD currently dialyzing through tunneled dialysis catheter. After careful discussion of risks, benefits, and alternatives the patient was offered left arm arteriovenous graft. The patient understood and wished to proceed.  OPERATIVE FINDINGS: calcific, but patent brachial artery (3mm). 4mm paired brachial vein as outflow.  DESCRIPTION OF PROCEDURE: After identification of the patient in the pre-operative holding area, the patient was transferred to the operating room. The patient was positioned supine on the operating room table. Anesthesia was induced. The left arm was prepped and draped in standard fashion. A surgical pause was performed confirming correct patient, procedure, and operative location.  The left brachial artery was exposed using a longitudinal incision in the distal arm just above the antecubital fossa.  Incision was carried down through subcutaneous tissue until the brachial sheath was encountered.  This was incised sharply.  The brachial artery was exposed and encircled with Silastic Vesseloops proximally and distally to the site of planned inflow.   The left brachial vein at the axilla was exposed using longitudinal incision just below the hairbearing  area of the axilla.  Incision was carried down until the brachial sheath was encountered.  The brachial vein was identified, exposed, encircled with Silastic Vesseloops.   Using a curved, sheathed tunneling device, a 4-7 mm tapered Gore-Tex graft was tunneled subcutaneously and gentle arc across the biceps of the left arm.  Patient was then heparinized with 5000 units of IV heparin .   The brachial artery was clamped proximally and distally.  An anterior arteriotomy was made with an 11 blade.  This was extended with Potts scissors.  The 4 mm end of the Gore-Tex graft was spatulated and then anastomosed end-to-side to the brachial arteriotomy using continuous running suture of 6-0 Prolene.  The anastomosis was completed and hemostasis ensured.  The graft was clamped to restore perfusion to the hand.   The brachial vein was clamped proximally and distally.  An anterior venotomy was made with an 11 blade.  This was extended with Potts scissors.  The 7 mm end of the Gore-Tex graft was then anastomosed end to side to the brachial vein venotomy using continuous running suture of 6-0 Prolene.  Immediately prior to completion the anastomosis was de-aired and flushed.  Anastomosis was then completed.  Hemostasis was insured.   Doppler machine was brought onto the field to interrogate the graft.  Doppler flow was noted in the radial artery.  About the arterial anastomosis flow was noted proximal and distal to the arterial anastomosis.  Distal to the venous anastomosis a Doppler bruit was heard.  Satisfied we ended the case here.   Surgical beds were irrigated copiously.  Hemostasis was again ensured in the surgical beds.  The wounds were closed  in layers using 3-0 Vicryl and 4-0 Monocryl.  Clean bandages were applied.  Upon completion of the case instrument and sharps counts were confirmed correct. The patient was transferred to the PACU in good condition. I was present for all portions of the procedure.  FOLLOW  UP PLAN: Assuming a normal postoperative course, VVS PA will see the patient in 4 weeks with AVG duplex.   [name]. Magda, MD [facility] Phone Number: [phone] 10/11/2023 10:03 AM

## 2023-10-11 NOTE — Interval H&P Note (Signed)
 History and Physical Interval Note:  10/11/2023 8:23 AM  Brittney Pace  has presented today for surgery, with the diagnosis of ESRD.  The various methods of treatment have been discussed with the patient and family. After consideration of risks, benefits and other options for treatment, the patient has consented to  Procedure(s): INSERTION, GRAFT, ARTERIOVENOUS, UPPER EXTREMITY (Left) as a surgical intervention.  The patient's history has been reviewed, patient examined, no change in status, stable for surgery.  I have reviewed the patient's chart and labs.  Questions were answered to the patient's satisfaction.     Debby LOISE Robertson

## 2023-10-11 NOTE — Progress Notes (Signed)
 Dr. CANDIE Birmingham made aware that the patient turned her insulin  pump off at 630 am and also did not decrease her basal rate by 20 percent at midnight as instructed by pre-admission nurse. Ok per Dr. Birmingham. Patient's CBG at 0650= 126. No new orders received.

## 2023-10-11 NOTE — Anesthesia Procedure Notes (Signed)
 Procedure Name: Intubation Date/Time: 10/11/2023 8:49 AM  Performed by: Marva Lonni PARAS, CRNAPre-anesthesia Checklist: Patient identified, Emergency Drugs available, Suction available and Patient being monitored Patient Re-evaluated:Patient Re-evaluated prior to induction Oxygen Delivery Method: Circle System Utilized Preoxygenation: Pre-oxygenation with 100% oxygen Induction Type: IV induction, Cricoid Pressure applied and Rapid sequence Laryngoscope Size: Glidescope and 3 Grade View: Grade I Tube type: Oral Tube size: 7.0 mm Number of attempts: 1 Airway Equipment and Method: Stylet Placement Confirmation: ETT inserted through vocal cords under direct vision, positive ETCO2 and breath sounds checked- equal and bilateral Tube secured with: Tape Dental Injury: Teeth and Oropharynx as per pre-operative assessment  Comments: Atraumatic intubation -- RSI d/t gastroparesis

## 2023-10-11 NOTE — Anesthesia Postprocedure Evaluation (Signed)
 Anesthesia Post Note  Patient: Shadawn Hanaway  Procedure(s) Performed: LEFT UPPER EXTREMITY, ARTERIOVENOUS GORE-TEX 4-7MM STRETCH GRAFT INSERTION (Left: Arm Upper)     Anesthesia Type: General Anesthetic complications: no   No notable events documented.  Last Vitals:  Vitals:   10/11/23 1045 10/11/23 1100  BP: (!) 138/46 (!) 141/57  Pulse: 62 61  Resp: 12 (!) 21  Temp:  (!) 36.4 C  SpO2: (!) 89% 99%                  Lauraine KATHEE Birmingham

## 2023-10-11 NOTE — Transfer of Care (Signed)
 Immediate Anesthesia Transfer of Care Note  Patient: Brittney Pace  Procedure(s) Performed: LEFT UPPER EXTREMITY, ARTERIOVENOUS GORE-TEX 4-7MM STRETCH GRAFT INSERTION (Left: Arm Upper)  Patient Location: PACU  Anesthesia Type:General  Level of Consciousness: drowsy and patient cooperative  Airway & Oxygen Therapy: Patient Spontanous Breathing  Post-op Assessment: Report given to RN and Post -op Vital signs reviewed and stable  Post vital signs: Reviewed and stable  Last Vitals:  Vitals Value Taken Time  BP    Temp 37 C   Pulse 69   Resp 14   SpO2 95%     Last Pain:  Vitals:   10/11/23 0729  TempSrc:   PainSc: 0-No pain      Patients Stated Pain Goal: 0 (10/11/23 0729)  Complications: No notable events documented.

## 2023-10-12 ENCOUNTER — Encounter (HOSPITAL_COMMUNITY): Payer: Self-pay | Admitting: Vascular Surgery

## 2023-10-12 ENCOUNTER — Other Ambulatory Visit: Payer: Self-pay

## 2023-10-12 DIAGNOSIS — N186 End stage renal disease: Secondary | ICD-10-CM

## 2023-10-14 ENCOUNTER — Other Ambulatory Visit: Payer: Self-pay

## 2023-10-14 ENCOUNTER — Inpatient Hospital Stay (HOSPITAL_COMMUNITY)
Admission: EM | Admit: 2023-10-14 | Discharge: 2023-10-17 | DRG: 862 | Disposition: A | Discharge status: Home / Self Care | Attending: Student | Admitting: Student

## 2023-10-14 ENCOUNTER — Emergency Department (HOSPITAL_COMMUNITY)

## 2023-10-14 DIAGNOSIS — I2510 Atherosclerotic heart disease of native coronary artery without angina pectoris: Secondary | ICD-10-CM | POA: Diagnosis present

## 2023-10-14 DIAGNOSIS — Z794 Long term (current) use of insulin: Secondary | ICD-10-CM

## 2023-10-14 DIAGNOSIS — Z66 Do not resuscitate: Secondary | ICD-10-CM | POA: Diagnosis present

## 2023-10-14 DIAGNOSIS — D631 Anemia in chronic kidney disease: Secondary | ICD-10-CM | POA: Diagnosis present

## 2023-10-14 DIAGNOSIS — G4730 Sleep apnea, unspecified: Secondary | ICD-10-CM | POA: Diagnosis present

## 2023-10-14 DIAGNOSIS — G912 (Idiopathic) normal pressure hydrocephalus: Secondary | ICD-10-CM | POA: Diagnosis present

## 2023-10-14 DIAGNOSIS — E034 Atrophy of thyroid (acquired): Secondary | ICD-10-CM | POA: Diagnosis present

## 2023-10-14 DIAGNOSIS — Z8249 Family history of ischemic heart disease and other diseases of the circulatory system: Secondary | ICD-10-CM

## 2023-10-14 DIAGNOSIS — I509 Heart failure, unspecified: Secondary | ICD-10-CM | POA: Diagnosis present

## 2023-10-14 DIAGNOSIS — Z7902 Long term (current) use of antithrombotics/antiplatelets: Secondary | ICD-10-CM | POA: Diagnosis not present

## 2023-10-14 DIAGNOSIS — M810 Age-related osteoporosis without current pathological fracture: Secondary | ICD-10-CM | POA: Diagnosis present

## 2023-10-14 DIAGNOSIS — L7634 Postprocedural seroma of skin and subcutaneous tissue following other procedure: Secondary | ICD-10-CM | POA: Diagnosis present

## 2023-10-14 DIAGNOSIS — E1051 Type 1 diabetes mellitus with diabetic peripheral angiopathy without gangrene: Secondary | ICD-10-CM | POA: Diagnosis present

## 2023-10-14 DIAGNOSIS — S40022A Contusion of left upper arm, initial encounter: Secondary | ICD-10-CM | POA: Diagnosis present

## 2023-10-14 DIAGNOSIS — D538 Other specified nutritional anemias: Secondary | ICD-10-CM | POA: Diagnosis present

## 2023-10-14 DIAGNOSIS — N186 End stage renal disease: Secondary | ICD-10-CM | POA: Diagnosis present

## 2023-10-14 DIAGNOSIS — I341 Nonrheumatic mitral (valve) prolapse: Secondary | ICD-10-CM | POA: Diagnosis present

## 2023-10-14 DIAGNOSIS — Z79890 Hormone replacement therapy (postmenopausal): Secondary | ICD-10-CM

## 2023-10-14 DIAGNOSIS — I132 Hypertensive heart and chronic kidney disease with heart failure and with stage 5 chronic kidney disease, or end stage renal disease: Secondary | ICD-10-CM | POA: Diagnosis present

## 2023-10-14 DIAGNOSIS — E109 Type 1 diabetes mellitus without complications: Secondary | ICD-10-CM | POA: Diagnosis present

## 2023-10-14 DIAGNOSIS — M797 Fibromyalgia: Secondary | ICD-10-CM | POA: Diagnosis present

## 2023-10-14 DIAGNOSIS — K219 Gastro-esophageal reflux disease without esophagitis: Secondary | ICD-10-CM | POA: Diagnosis present

## 2023-10-14 DIAGNOSIS — E113593 Type 2 diabetes mellitus with proliferative diabetic retinopathy without macular edema, bilateral: Secondary | ICD-10-CM | POA: Diagnosis present

## 2023-10-14 DIAGNOSIS — R531 Weakness: Secondary | ICD-10-CM | POA: Diagnosis present

## 2023-10-14 DIAGNOSIS — E103593 Type 1 diabetes mellitus with proliferative diabetic retinopathy without macular edema, bilateral: Secondary | ICD-10-CM | POA: Diagnosis present

## 2023-10-14 DIAGNOSIS — R0989 Other specified symptoms and signs involving the circulatory and respiratory systems: Secondary | ICD-10-CM | POA: Diagnosis present

## 2023-10-14 DIAGNOSIS — T8140XA Infection following a procedure, unspecified, initial encounter: Principal | ICD-10-CM | POA: Diagnosis present

## 2023-10-14 DIAGNOSIS — E1022 Type 1 diabetes mellitus with diabetic chronic kidney disease: Secondary | ICD-10-CM | POA: Diagnosis present

## 2023-10-14 DIAGNOSIS — Z961 Presence of intraocular lens: Secondary | ICD-10-CM | POA: Diagnosis present

## 2023-10-14 DIAGNOSIS — I252 Old myocardial infarction: Secondary | ICD-10-CM | POA: Diagnosis not present

## 2023-10-14 DIAGNOSIS — Z955 Presence of coronary angioplasty implant and graft: Secondary | ICD-10-CM

## 2023-10-14 DIAGNOSIS — E1059 Type 1 diabetes mellitus with other circulatory complications: Secondary | ICD-10-CM | POA: Diagnosis not present

## 2023-10-14 DIAGNOSIS — Z9841 Cataract extraction status, right eye: Secondary | ICD-10-CM

## 2023-10-14 DIAGNOSIS — Z87891 Personal history of nicotine dependence: Secondary | ICD-10-CM

## 2023-10-14 DIAGNOSIS — E1042 Type 1 diabetes mellitus with diabetic polyneuropathy: Secondary | ICD-10-CM | POA: Diagnosis present

## 2023-10-14 DIAGNOSIS — Z992 Dependence on renal dialysis: Secondary | ICD-10-CM | POA: Diagnosis not present

## 2023-10-14 DIAGNOSIS — N183 Chronic kidney disease, stage 3 unspecified: Secondary | ICD-10-CM | POA: Diagnosis not present

## 2023-10-14 DIAGNOSIS — R509 Fever, unspecified: Secondary | ICD-10-CM | POA: Diagnosis present

## 2023-10-14 DIAGNOSIS — Z79899 Other long term (current) drug therapy: Secondary | ICD-10-CM

## 2023-10-14 DIAGNOSIS — A419 Sepsis, unspecified organism: Secondary | ICD-10-CM | POA: Diagnosis present

## 2023-10-14 DIAGNOSIS — Z9641 Presence of insulin pump (external) (internal): Secondary | ICD-10-CM | POA: Diagnosis present

## 2023-10-14 DIAGNOSIS — E1043 Type 1 diabetes mellitus with diabetic autonomic (poly)neuropathy: Secondary | ICD-10-CM | POA: Diagnosis present

## 2023-10-14 DIAGNOSIS — Z90710 Acquired absence of both cervix and uterus: Secondary | ICD-10-CM

## 2023-10-14 DIAGNOSIS — F32A Depression, unspecified: Secondary | ICD-10-CM | POA: Diagnosis present

## 2023-10-14 DIAGNOSIS — Y838 Other surgical procedures as the cause of abnormal reaction of the patient, or of later complication, without mention of misadventure at the time of the procedure: Secondary | ICD-10-CM | POA: Diagnosis present

## 2023-10-14 DIAGNOSIS — Z951 Presence of aortocoronary bypass graft: Secondary | ICD-10-CM

## 2023-10-14 DIAGNOSIS — K589 Irritable bowel syndrome without diarrhea: Secondary | ICD-10-CM | POA: Diagnosis present

## 2023-10-14 DIAGNOSIS — K3184 Gastroparesis: Secondary | ICD-10-CM | POA: Diagnosis present

## 2023-10-14 DIAGNOSIS — I6350 Cerebral infarction due to unspecified occlusion or stenosis of unspecified cerebral artery: Secondary | ICD-10-CM | POA: Diagnosis present

## 2023-10-14 DIAGNOSIS — Z880 Allergy status to penicillin: Secondary | ICD-10-CM

## 2023-10-14 DIAGNOSIS — R262 Difficulty in walking, not elsewhere classified: Secondary | ICD-10-CM | POA: Diagnosis present

## 2023-10-14 DIAGNOSIS — Z7982 Long term (current) use of aspirin: Secondary | ICD-10-CM

## 2023-10-14 DIAGNOSIS — Z885 Allergy status to narcotic agent status: Secondary | ICD-10-CM

## 2023-10-14 DIAGNOSIS — Z8673 Personal history of transient ischemic attack (TIA), and cerebral infarction without residual deficits: Secondary | ICD-10-CM

## 2023-10-14 DIAGNOSIS — T827XXA Infection and inflammatory reaction due to other cardiac and vascular devices, implants and grafts, initial encounter: Secondary | ICD-10-CM | POA: Diagnosis not present

## 2023-10-14 DIAGNOSIS — M1990 Unspecified osteoarthritis, unspecified site: Secondary | ICD-10-CM | POA: Diagnosis present

## 2023-10-14 LAB — COMPREHENSIVE METABOLIC PANEL WITH GFR
ALT: 14 U/L (ref 0–44)
AST: 36 U/L (ref 15–41)
Albumin: 4 g/dL (ref 3.5–5.0)
Alkaline Phosphatase: 120 U/L (ref 38–126)
Anion gap: 15 (ref 5–15)
BUN: 12 mg/dL (ref 8–23)
CO2: 26 mmol/L (ref 22–32)
Calcium: 9.2 mg/dL (ref 8.9–10.3)
Chloride: 96 mmol/L — ABNORMAL LOW (ref 98–111)
Creatinine, Ser: 1.59 mg/dL — ABNORMAL HIGH (ref 0.44–1.00)
GFR, Estimated: 35 mL/min — ABNORMAL LOW (ref >60)
Glucose, Bld: 104 mg/dL — ABNORMAL HIGH (ref 70–99)
Potassium: 3.9 mmol/L (ref 3.5–5.1)
Sodium: 136 mmol/L (ref 135–145)
Total Bilirubin: 0.4 mg/dL (ref 0.0–1.2)
Total Protein: 6.5 g/dL (ref 6.5–8.1)

## 2023-10-14 LAB — CBC WITH DIFFERENTIAL/PLATELET
Abs Immature Granulocytes: 0.03 K/uL (ref 0.00–0.07)
Basophils Absolute: 0 K/uL (ref 0.0–0.1)
Basophils Relative: 1 %
Eosinophils Absolute: 0.5 K/uL (ref 0.0–0.5)
Eosinophils Relative: 7 %
HCT: 27.6 % — ABNORMAL LOW (ref 36.0–46.0)
Hemoglobin: 8.8 g/dL — ABNORMAL LOW (ref 12.0–15.0)
Immature Granulocytes: 0 %
Lymphocytes Relative: 16 %
Lymphs Abs: 1.3 K/uL (ref 0.7–4.0)
MCH: 29.2 pg (ref 26.0–34.0)
MCHC: 31.9 g/dL (ref 30.0–36.0)
MCV: 91.7 fL (ref 80.0–100.0)
Monocytes Absolute: 0.9 K/uL (ref 0.1–1.0)
Monocytes Relative: 12 %
Neutro Abs: 5 K/uL (ref 1.7–7.7)
Neutrophils Relative %: 64 %
Platelets: 288 K/uL (ref 150–400)
RBC: 3.01 MIL/uL — ABNORMAL LOW (ref 3.87–5.11)
RDW: 13.2 % (ref 11.5–15.5)
WBC: 7.8 K/uL (ref 4.0–10.5)
nRBC: 0 % (ref 0.0–0.2)

## 2023-10-14 LAB — I-STAT CG4 LACTIC ACID, ED: Lactic Acid, Venous: 1 mmol/L (ref 0.5–1.9)

## 2023-10-14 LAB — PROTIME-INR
INR: 1 (ref 0.8–1.2)
Prothrombin Time: 13.4 s (ref 11.4–15.2)

## 2023-10-14 MED ORDER — INSULIN PUMP
Freq: Three times a day (TID) | SUBCUTANEOUS | Status: DC
  Filled 2023-10-14: qty 1

## 2023-10-14 MED ORDER — OXYCODONE HCL 5 MG PO TABS
5.0000 mg | ORAL_TABLET | Freq: Four times a day (QID) | ORAL | Status: DC | PRN
  Administered 2023-10-14 – 2023-10-16 (×5): 5 mg via ORAL
  Filled 2023-10-14 (×5): qty 1

## 2023-10-14 MED ORDER — MELATONIN 3 MG PO TABS: 3.0000 mg | ORAL_TABLET | Freq: Every evening | ORAL | Status: DC | PRN

## 2023-10-14 MED ORDER — ACETAMINOPHEN 325 MG PO TABS
650.0000 mg | ORAL_TABLET | Freq: Once | ORAL | Status: DC
  Filled 2023-10-14: qty 2

## 2023-10-14 MED ORDER — ONDANSETRON HCL 4 MG/2ML IJ SOLN: 4.0000 mg | Freq: Four times a day (QID) | INTRAMUSCULAR | Status: DC | PRN

## 2023-10-14 MED ORDER — ASPIRIN 325 MG PO TBEC
325.0000 mg | DELAYED_RELEASE_TABLET | Freq: Every day | ORAL | Status: DC
Start: 2023-10-15 — End: 2023-10-17
  Administered 2023-10-15 – 2023-10-16 (×2): 325 mg via ORAL
  Filled 2023-10-14 (×4): qty 1

## 2023-10-14 MED ORDER — ONDANSETRON HCL 4 MG/2ML IJ SOLN
4.0000 mg | Freq: Once | INTRAMUSCULAR | Status: AC
  Administered 2023-10-14: 4 mg via INTRAVENOUS
  Filled 2023-10-14: qty 2

## 2023-10-14 MED ORDER — VANCOMYCIN HCL 750 MG/150ML IV SOLN: 750.0000 mg | INTRAVENOUS | Status: DC

## 2023-10-14 MED ORDER — VANCOMYCIN HCL IN DEXTROSE 1-5 GM/200ML-% IV SOLN
1000.0000 mg | Freq: Once | INTRAVENOUS | Status: AC
  Administered 2023-10-14: 1000 mg via INTRAVENOUS
  Filled 2023-10-14: qty 200

## 2023-10-14 MED ORDER — PANTOPRAZOLE SODIUM 40 MG PO TBEC
40.0000 mg | DELAYED_RELEASE_TABLET | Freq: Every day | ORAL | Status: DC
  Administered 2023-10-15 – 2023-10-16 (×2): 40 mg via ORAL
  Filled 2023-10-14 (×2): qty 1

## 2023-10-14 MED ORDER — ONDANSETRON HCL 4 MG PO TABS: 4.0000 mg | ORAL_TABLET | Freq: Four times a day (QID) | ORAL | Status: DC | PRN

## 2023-10-14 MED ORDER — BISACODYL 5 MG PO TBEC: 5.0000 mg | DELAYED_RELEASE_TABLET | Freq: Every day | ORAL | Status: DC | PRN

## 2023-10-14 MED ORDER — CARVEDILOL 12.5 MG PO TABS
12.5000 mg | ORAL_TABLET | ORAL | Status: DC
  Administered 2023-10-15: 12.5 mg via ORAL
  Filled 2023-10-14 (×4): qty 1

## 2023-10-14 MED ORDER — HEPARIN SODIUM (PORCINE) 5000 UNIT/ML IJ SOLN
5000.0000 [IU] | Freq: Three times a day (TID) | INTRAMUSCULAR | Status: DC
  Administered 2023-10-15 – 2023-10-16 (×6): 5000 [IU] via SUBCUTANEOUS
  Filled 2023-10-14 (×8): qty 1

## 2023-10-14 MED ORDER — THYROID 60 MG PO TABS
60.0000 mg | ORAL_TABLET | Freq: Every day | ORAL | Status: DC
  Administered 2023-10-15 – 2023-10-16 (×2): 60 mg via ORAL
  Filled 2023-10-14 (×4): qty 1

## 2023-10-14 MED ORDER — FENTANYL CITRATE PF 50 MCG/ML IJ SOSY
50.0000 ug | PREFILLED_SYRINGE | Freq: Once | INTRAMUSCULAR | Status: AC
  Administered 2023-10-14: 50 ug via INTRAVENOUS
  Filled 2023-10-14: qty 1

## 2023-10-14 NOTE — ED Notes (Signed)
 Took pt to restroom, unable to urinate.

## 2023-10-14 NOTE — H&P (Signed)
 History and Physical    Patient: Brittney Pace FMW:[id number] DOB: 1955/12/26 DOA: 10/14/2023 DOS: the patient was seen and examined on 10/14/2023 PCP: Timmy Maude SAUNDERS, MD  Patient coming from: Home  Chief Complaint:  Chief Complaint  Patient presents with   Vascular Access Problem   Fever   HPI: Brittney Pace is a 68 y.o. female with medical history significant for end-stage renal disease on hemodialysis Tuesday Thursdays and Saturdays, type 1 diabetes mellitus, Is status post left arm fistula placement 3 days ago.  She has been receiving hemodialysis through a catheter in her neck for the last several months.  The patient reports that her left arm has been sore since the fistula was placed 3 days ago.  She was told to leave the bandage on for the first several days which she did.  When she removed it this morning she noticed that it was oozing.  The gauze over the fistula was completely saturated.  She also noticed that today she really started to feel chilled and not well.  She denies any fever or shortness of breath or chest pain or diarrhea.  She did go to hemodialysis today and did okay until the last hour when she really felt bad and told the staff that she needed to come to the ED.  In the emergency department the patient's temperature was 100.3.  Her respiratory rate was 21 and pulse 74.  Her white blood cell count was normal.  Lactic acid was normal.  Dr. Antony vascular surgery was consulted and he recommended admitting the patient to [facility].  The hospitalist service will be admitting.  Review of Systems: As mentioned in the history of present illness. All other systems reviewed and are negative. 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   Fibromyalgia    Gastroparesis    GERD (gastroesophageal reflux disease)    Headache    migraines   Hypothyroidism    IBS (irritable bowel syndrome)    Idiopathic edema    Iron deficiency anemia 09/04/2019   Lyme disease    Mitral valve prolapse    Myocardial infarction (HCC)    1 major in 1999 and 2 minor  small vessel disease.   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  the second stroke was a lacunar    Past Surgical History:  Procedure Laterality Date   ABDOMINAL HYSTERECTOMY     APPENDECTOMY     BREAST SURGERY     B/L biopsy and lumpectomy  CARDIAC CATHETERIZATION     CARPAL TUNNEL RELEASE     CATARACT EXTRACTION W/ INTRAOCULAR LENS IMPLANT     right eye   COLONOSCOPY W/ BIOPSIES AND POLYPECTOMY     CORONARY ARTERY BYPASS GRAFT     coronary artery stents     at LAD and LIMA   DIALYSIS/PERMA CATHETER INSERTION N/A 03/24/2023   Procedure: DIALYSIS/PERMA CATHETER INSERTION;  Surgeon: Tobie Gordy POUR, MD;  Location: [facility] INVASIVE CV LAB;  Service: Cardiovascular;  Laterality: N/A;   ECTOPIC PREGNANCY SURGERY     INSERTION OF ARTERIOVENOUS (AV) ARTEGRAFT ARM Left 10/11/2023   Procedure: LEFT UPPER EXTREMITY, ARTERIOVENOUS GORE-TEX 4-7MM STRETCH GRAFT INSERTION;  Surgeon: Magda [name], MD;  Location: [facility] OR;  Service: Vascular;  Laterality: Left;   IR FLUORO GUIDE CV LINE RIGHT  05/22/2023   NASAL SEPTUM SURGERY     OPEN REDUCTION  INTERNAL FIXATION (ORIF) DISTAL RADIAL FRACTURE Right 05/06/2015   Procedure: OPEN REDUCTION INTERNAL FIXATION (ORIF) RIGHT DISTAL RADIAL FRACTURE AND REPAIRS AS NEEDED;  Surgeon: Prentice Pagan, MD;  Location: [facility] OR;  Service: Orthopedics;  Laterality: Right;   ORIF HUMERUS FRACTURE Right 04/30/2020   Procedure: OPEN REDUCTION INTERNAL FIXATION (ORIF) PROXIMAL HUMERUS FRACTURE;  Surgeon: Melita Drivers, MD;  Location: [facility] ORS;  Service: Orthopedics;  Laterality: Right;    ORIF WRIST FRACTURE Left 11/05/2014   ORIF WRIST FRACTURE Left 11/05/2014   Procedure: OPEN REDUCTION INTERNAL FIXATION (ORIF) LEFT WRIST FRACTURE AND REPAIR AS INDICATED;  Surgeon: Prentice Pagan, MD;  Location: [facility] OR;  Service: Orthopedics;  Laterality: Left;   TRIGGER FINGER RELEASE     TUNNELLED CATHETER EXCHANGE N/A 06/01/2023   Procedure: TUNNELLED CATHETER EXCHANGE;  Surgeon: Melia Lynwood ORN, MD;  Location: [facility] INVASIVE CV LAB;  Service: Cardiovascular;  Laterality: N/A;   Social History:  reports that she has quit smoking. Her smoking use included cigarettes. She started smoking about 50 years ago. She has a 50.7 pack-year smoking history. She has never used smokeless tobacco. She reports that she does not currently use alcohol after a past usage of about 7.0 standard drinks of alcohol per week. She reports that she does not use drugs.  Allergies  Allergen Reactions   Morphine  Anaphylaxis and Shortness Of Breath   Penicillins Hives    Tolerated ANCEF  on 04/30/20 Has patient had a PCN reaction causing immediate rash, facial/tongue/throat swelling, SOB or lightheadedness with hypotension: Yes Has patient had a PCN reaction causing severe rash involving mucus membranes or skin necrosis: No Has patient had a PCN reaction that required hospitalization No Has patient had a PCN reaction occurring within the last 10 years: No If all of the above answers are NO, then may proceed with Cephalosporin use.   Tramadol Anaphylaxis    Acetaminophen  Other (See Comments)    Alters insulin  pump readings    Amitriptyline Other (See Comments)    Severe headache/ out of body feeling   Codeine Other (See Comments)    Severe headaches/ out of body feeling    Ibuprofen Other (See Comments)    Messes up CGM reading on glucose monitor     Krill Oil Diarrhea, Itching and Nausea And Vomiting   Lactose Intolerance (Gi) Diarrhea   Losartan Cough   Propoxyphene Other (See Comments)    Severe headaches / out of body feeling    Shellfish Allergy Diarrhea and Nausea And Vomiting   Statins Other (See Comments)    Muscle pains Other reaction(s): Other   Sulfamethoxazole  Other (See Comments)    Mouth ulcers    Sulfites Itching and Other (See Comments)    Mouth ulcers   Ace Inhibitors Other (See Comments)    Other Reaction(s): Unknown   Fluconazole     Other reaction(s): Contact Dermatitis (intolerance)   Norvasc  [Amlodipine  Besylate] Swelling    Family History  Problem Relation Age of Onset   Breast cancer Mother    Migraines Mother    Heart disease Father    Hypertension Father    Breast cancer Sister     Prior to Admission medications   Medication Sig Start Date End Date Taking? Authorizing Provider  ACCU-CHEK GUIDE test strip 3 (three) times daily. 05/03/21   [provider]  aspirin  EC 325 MG tablet Take 1 tablet (325 mg total) by mouth daily. 03/06/19   Pietro Redell RAMAN, MD  b complex-vitamin c -folic acid  (NEPHRO-VITE) 0.8 MG TABS tablet Take 1 tablet by mouth daily. 04/18/23   [provider]  carvedilol  (COREG ) 12.5 MG tablet TAKE 1 TABLET BY MOUTH TWICE DAILY WITH A MEAL Patient taking differently: Take 12.5 mg by mouth See admin instructions. Take 12.5 mg once a day  on tues, thurs and sat. All the the day take twice a day 04/20/23   Pietro Redell RAMAN, MD  cetirizine (ZYRTEC) 10 MG tablet Take 10 mg by mouth at bedtime. 03/26/21   [provider]  clopidogrel  (PLAVIX ) 75 MG tablet Take 1  tablet (75 mg total) by mouth daily. 10/11/23 10/10/24  Magda [name], MD  Continuous Glucose Sensor (DEXCOM G7 SENSOR) MISC Inject 1 each into the skin as directed. Every 10 days change it. 11/24/22   [provider]  Darbepoetin Alfa -Albumin (ARANESP  IJ) Inject as directed.    [provider]  dexamethasone  (DECADRON ) 0.1 % ophthalmic solution Place 1 drop into the left eye daily as needed (Left ear daily.). 08/09/23   [provider]  diclofenac  Sodium (VOLTAREN ) 1 % GEL Apply 1 Application topically daily as needed (pain). 10/25/18   [provider]  diphenoxylate -atropine  (LOMOTIL ) 2.5-0.025 MG tablet TAKE 1 TABLET BY MOUTH 4 TIMES DAILY AS NEEDED FOR DIARRHEA OR  LOOSE  STOOLS 12/24/20   Timmy Maude SAUNDERS, MD  Doxercalciferol (HECTOROL IV) 2 mcg. 04/20/23 04/18/24  [provider]  esomeprazole (NEXIUM) 20 MG capsule Take 20 mg by mouth every morning.     [provider]  fluocinonide  (LIDEX ) 0.05 % external solution Apply 1 Application topically daily. 08/23/23   Timmy Maude SAUNDERS, MD  Glucagon (GVOKE HYPOPEN 2-PACK) 0.5 MG/0.1ML SOAJ Use as needed in case of hypoglycemic emergency 07/19/23   [provider]  hydrOXYzine  (ATARAX ) 10 MG tablet TAKE 1 TABLET BY MOUTH EVERY 6 HOURS AS NEEDED Patient taking differently: Take 10 mg by mouth every 6 (six) hours as needed for itching. 08/21/23   Timmy Maude SAUNDERS, MD  ketoconazole  (NIZORAL ) 2 % shampoo Apply 1 Application topically 2 (two) times a week.    [provider]  ketorolac  (ACULAR ) 0.5 % ophthalmic solution Place 1 drop into both eyes 2 (two) times daily.    [provider]  Melatonin Gummies 2.5 MG CHEW Chew 2.5 mg by mouth at bedtime as needed (sleep).    [provider]  Methoxy PEG-Epoetin  Beta (MIRCERA IJ) 75 mcg. 05/09/23 05/07/24  [provider]  Multiple Vitamins-Minerals (MULTIVITAMIN WITH MINERALS) tablet Take 1 tablet by mouth daily. + Zinc     [provider]  nitroGLYCERIN  (NITROLINGUAL )  0.4 MG/SPRAY spray Place 1 spray under the tongue every 5 (five) minutes x 3 doses as needed. 02/09/23   [facility], Jon Garre, PA  NOVOLOG  100 UNIT/ML injection Inject 25 Units into the skin continuous. Insulin  pump 02/21/22   [provider]  OVER THE COUNTER MEDICATION Take 1 capsule by mouth at bedtime. Conjugated linoleic acid    [provider]  OVER THE COUNTER MEDICATION Take 4 tablets by mouth at bedtime. Magnesium  Threonate    [provider]  oxyCODONE  (ROXICODONE ) 5 MG immediate release tablet Take 1 tablet (5 mg total) by mouth every 8 (eight) hours as needed. 10/11/23 10/10/24  Magda [name], MD  prednisoLONE  acetate (PRED FORTE ) 1 % ophthalmic suspension Place 1 drop into both eyes 2 (two) times daily.    [provider]  PREMARIN  vaginal cream Place 1 applicator vaginally daily as needed (irritation). 03/24/21   [provider]  thyroid  (ARMOUR THYROID ) 60 MG tablet Take 60 mg by mouth daily before breakfast. 07/30/21   [provider]  torsemide  ([name] ) 20 MG tablet Take 2 tablets (40 mg total) by mouth daily. Patient taking differently: Take 40 mg by mouth daily as needed (Not on Dialysis days). 10/10/22   Lucien Orren SAILOR, PA-C  valACYclovir  (VALTREX ) 500 MG tablet Take 500 mg by mouth daily as needed (Flair up).    [provider]    Physical Exam: Vitals:   10/14/23 1554 10/14/23 2020  BP: (!) 121/58 (!) 129/57  Pulse: 76 74  Resp: 16 (!) 21  Temp: 100.3 F (37.9 C)   TempSrc: Oral   SpO2: 97% 98%   Physical Exam:  General: No acute distress, chronically ill appearing HEENT: Normocephalic, atraumatic, poor vision Cardiovascular: Normal rate and rhythm. Distal pulses faint Pulmonary: Normal pulmonary effort, normal breath sounds Gastrointestinal: Nondistended abdomen, soft, non-tender, normoactive bowel sounds Musculoskeletal:Normal ROM, no lower ext  edema Lymphadenopathy: No cervical LAD. Skin: left arm is red from mid bicep area down to mid forearm.  Her fistula has a bandage over it that is stained with exudate Neuro: No facial asymmetry, moves all extremities, AAOx3. PSYCH: Attentive and cooperative  Data Reviewed:  Results for orders placed or performed during the hospital encounter of 10/14/23 (from the past 24 hours)  Comprehensive metabolic panel     Status: Abnormal   Collection Time: 10/14/23  6:58 PM  Result Value Ref Range   Sodium 136 135 - 145 mmol/L   Potassium 3.9 3.5 - 5.1 mmol/L   Chloride 96 (L) 98 - 111 mmol/L   CO2 26 22 - 32 mmol/L   Glucose, Bld 104 (H) 70 - 99 mg/dL   BUN 12 8 - 23 mg/dL   Creatinine, Ser 8.40 (H) 0.44 - 1.00 mg/dL   Calcium  9.2 8.9 - 10.3 mg/dL   Total Protein 6.5 6.5 - 8.1 g/dL   Albumin 4.0 3.5 - 5.0 g/dL   AST 36 15 - 41 U/L   ALT 14 0 - 44 U/L   Alkaline Phosphatase 120 38 - 126 U/L   Total Bilirubin 0.4 0.0 - 1.2 mg/dL   GFR, Estimated 35 (L) >60 mL/min   Anion gap 15 5 - 15  CBC with Differential     Status: Abnormal   Collection Time: 10/14/23  6:58 PM  Result Value Ref Range   WBC 7.8 4.0 - 10.5 K/uL   RBC 3.01 (L) 3.87 - 5.11 MIL/uL   Hemoglobin 8.8 (L) 12.0 - 15.0 g/dL  HCT 27.6 (L) 36.0 - 46.0 %   MCV 91.7 80.0 - 100.0 fL   MCH 29.2 26.0 - 34.0 pg   MCHC 31.9 30.0 - 36.0 g/dL   RDW 86.7 88.4 - 84.4 %   Platelets 288 150 - 400 K/uL   nRBC 0.0 0.0 - 0.2 %   Neutrophils Relative % 64 %   Neutro Abs 5.0 1.7 - 7.7 K/uL   Lymphocytes Relative 16 %   Lymphs Abs 1.3 0.7 - 4.0 K/uL   Monocytes Relative 12 %   Monocytes Absolute 0.9 0.1 - 1.0 K/uL   Eosinophils Relative 7 %   Eosinophils Absolute 0.5 0.0 - 0.5 K/uL   Basophils Relative 1 %   Basophils Absolute 0.0 0.0 - 0.1 K/uL   Immature Granulocytes 0 %   Abs Immature Granulocytes 0.03 0.00 - 0.07 K/uL  Protime-INR     Status: None   Collection Time: 10/14/23  6:58 PM  Result Value Ref Range   Prothrombin Time  13.4 11.4 - 15.2 seconds   INR 1.0 0.8 - 1.2  I-Stat Lactic Acid, ED     Status: None   Collection Time: 10/14/23  7:06 PM  Result Value Ref Range   Lactic Acid, Venous 1.0 0.5 - 1.9 mmol/L     Assessment and Plan: Likely infected fistula - - IV vancomycin  ordered - Vascular surgery consulted  2. ESRD -  - Consult nephrology in a.m. - She is getting HD TThSat through a catheter in her chest  3. DMT1  - Continue insulin  pump  4.  Hypothyroidism  - Continue Synthroid   5.  Anemia due to chronic disease - Monitor hemoglobin   Advance Care Planning:   Code Status: Limited: Do not attempt resuscitation (DNR) -DNR-LIMITED -Do Not Intubate/DNI   The patient names her husband as her surrogate decision maker and she wants to be DNR.  Consults: Vascular surgery  Family Communication: None  Severity of Illness: The appropriate patient status for this patient is INPATIENT. Inpatient status is judged to be reasonable and necessary in order to provide the required intensity of service to ensure the patient's safety. The patient's presenting symptoms, physical exam findings, and initial radiographic and laboratory data in the context of their chronic comorbidities is felt to place them at high risk for further clinical deterioration. Furthermore, it is not anticipated that the patient will be medically stable for discharge from the hospital within 2 midnights of admission.   * I certify that at the point of admission it is my clinical judgment that the patient will require inpatient hospital care spanning beyond 2 midnights from the point of admission due to high intensity of service, high risk for further deterioration and high frequency of surveillance required.*  Author: ARTHEA CHILD, MD 10/14/2023 9:52 PM  For on call review [email or web address].

## 2023-10-14 NOTE — ED Provider Notes (Signed)
 [place] EMERGENCY DEPARTMENT AT [facility] Provider Note   CSN: [id number] Arrival date & time: 10/14/23  1550     Patient presents with: Vascular Access Problem and Fever   Brittney Pace is a 68 y.o. female past medical history significant for ESRD, diabetes, GERD, CHF, and recent fistula placement on 9/17 presents today for fever, chills, fatigue, surgical site oozing and pain.  Patient also notes mild cough and sore throat which she attributed to recent procedure.  Patient denies nausea, vomiting, shortness of breath, chest pain, diarrhea, abdominal pain, or any other complaints at this time.    Fever Associated symptoms: chills, cough and sore throat        Prior to Admission medications   Medication Sig Start Date End Date Taking? Authorizing Provider  ACCU-CHEK GUIDE test strip 3 (three) times daily. 05/03/21   [provider]  aspirin  EC 325 MG tablet Take 1 tablet (325 mg total) by mouth daily. 03/06/19   Pietro Redell RAMAN, MD  b complex-vitamin c -folic acid  (NEPHRO-VITE) 0.8 MG TABS tablet Take 1 tablet by mouth daily. 04/18/23   [provider]  carvedilol  (COREG ) 12.5 MG tablet TAKE 1 TABLET BY MOUTH TWICE DAILY WITH A MEAL Patient taking differently: Take 12.5 mg by mouth See admin instructions. Take 12.5 mg once a day  on tues, thurs and sat. All the the day take twice a day 04/20/23   Pietro Redell RAMAN, MD  cetirizine (ZYRTEC) 10 MG tablet Take 10 mg by mouth at bedtime. 03/26/21   [provider]  clopidogrel  (PLAVIX ) 75 MG tablet Take 1 tablet (75 mg total) by mouth daily. 10/11/23 10/10/24  Magda [name], MD  Continuous Glucose Sensor (DEXCOM G7 SENSOR) MISC Inject 1 each into the skin as directed. Every 10 days change it. 11/24/22   [provider]  Darbepoetin Alfa -Albumin (ARANESP  IJ) Inject as directed.    [provider]  dexamethasone  (DECADRON ) 0.1 % ophthalmic solution Place 1 drop into the left eye daily  as needed (Left ear daily.). 08/09/23   [provider]  diclofenac  Sodium (VOLTAREN ) 1 % GEL Apply 1 Application topically daily as needed (pain). 10/25/18   [provider]  diphenoxylate -atropine  (LOMOTIL ) 2.5-0.025 MG tablet TAKE 1 TABLET BY MOUTH 4 TIMES DAILY AS NEEDED FOR DIARRHEA OR  LOOSE  STOOLS 12/24/20   Timmy Maude SAUNDERS, MD  Doxercalciferol (HECTOROL IV) 2 mcg. 04/20/23 04/18/24  [provider]  esomeprazole (NEXIUM) 20 MG capsule Take 20 mg by mouth every morning.     [provider]  fluocinonide  (LIDEX ) 0.05 % external solution Apply 1 Application topically daily. 08/23/23   Timmy Maude SAUNDERS, MD  Glucagon (GVOKE HYPOPEN 2-PACK) 0.5 MG/0.1ML SOAJ Use as needed in case of hypoglycemic emergency 07/19/23   [provider]  hydrOXYzine  (ATARAX ) 10 MG tablet TAKE 1 TABLET BY MOUTH EVERY 6 HOURS AS NEEDED Patient taking differently: Take 10 mg by mouth every 6 (six) hours as needed for itching. 08/21/23   Timmy Maude SAUNDERS, MD  ketoconazole  (NIZORAL ) 2 % shampoo Apply 1 Application topically 2 (two) times a week.    [provider]  ketorolac  (ACULAR ) 0.5 % ophthalmic solution Place 1 drop into both eyes 2 (two) times daily.    [provider]  Melatonin Gummies 2.5 MG CHEW Chew 2.5 mg by mouth at bedtime as needed (sleep).    [provider]  Methoxy PEG-Epoetin  Beta (MIRCERA IJ) 75 mcg. 05/09/23 05/07/24  [provider]  Multiple Vitamins-Minerals (MULTIVITAMIN WITH MINERALS) tablet Take 1 tablet by mouth daily. + Zinc    [provider]  nitroGLYCERIN  (NITROLINGUAL ) 0.4 MG/SPRAY spray Place 1 spray under the tongue every 5 (five) minutes x 3 doses as needed. 02/09/23   Madie Jon Garre, PA  NOVOLOG  100 UNIT/ML injection Inject 25 Units into the skin continuous. Insulin  pump 02/21/22   [provider]  OVER THE COUNTER MEDICATION Take 1 capsule by mouth at bedtime. Conjugated linoleic acid     [provider]  OVER THE COUNTER MEDICATION Take 4 tablets by mouth at bedtime. Magnesium  Threonate    [provider]  oxyCODONE  (ROXICODONE ) 5 MG immediate release tablet Take 1 tablet (5 mg total) by mouth every 8 (eight) hours as needed. 10/11/23 10/10/24  Magda [name], MD  prednisoLONE  acetate (PRED FORTE ) 1 % ophthalmic suspension Place 1 drop into both eyes 2 (two) times daily.    [provider]  PREMARIN  vaginal cream Place 1 applicator vaginally daily as needed (irritation). 03/24/21   [provider]  thyroid  (ARMOUR THYROID ) 60 MG tablet Take 60 mg by mouth daily before breakfast. 07/30/21   [provider]  torsemide  ([name] ) 20 MG tablet Take 2 tablets (40 mg total) by mouth daily. Patient taking differently: Take 40 mg by mouth daily as needed (Not on Dialysis days). 10/10/22   Lucien Orren SAILOR, PA-C  valACYclovir  (VALTREX ) 500 MG tablet Take 500 mg by mouth daily as needed (Flair up).    [provider]    Allergies: Morphine , Penicillins, Tramadol, Acetaminophen , Amitriptyline, Codeine, Ibuprofen, Krill oil, Lactose intolerance (gi), Losartan, Propoxyphene, Shellfish allergy, Statins, Sulfamethoxazole, Sulfites, Ace inhibitors, Fluconazole, and Norvasc  [amlodipine  besylate]    Review of Systems  Constitutional:  Positive for chills, fatigue and fever.  HENT:  Positive for sore throat.   Respiratory:  Positive for cough.   Skin:  Positive for wound.    Updated Vital Signs BP (!) 129/57 (BP Location: Right Arm)   Pulse 74   Temp 100.3 F (37.9 C) (Oral)   Resp (!) 21   SpO2 98%   Physical Exam Vitals and nursing note reviewed.  Constitutional:      General: She is not in acute distress.    Appearance: Normal appearance. She is well-developed. She is ill-appearing. She is not toxic-appearing or diaphoretic.  HENT:     Head: Normocephalic and atraumatic.     Right Ear: External ear normal.     Left Ear: External ear  normal.     Mouth/Throat:     Mouth: Mucous membranes are moist.     Pharynx: Oropharynx is clear.  Eyes:     Conjunctiva/sclera: Conjunctivae normal.  Cardiovascular:     Rate and Rhythm: Normal rate and regular rhythm.     Pulses: Normal pulses.     Heart sounds: Normal heart sounds. No murmur heard. Pulmonary:     Effort: Pulmonary effort is normal. No respiratory distress.     Breath sounds: Normal breath sounds.  Abdominal:     Palpations: Abdomen is soft.     Tenderness: There is no abdominal tenderness.  Musculoskeletal:        General: No swelling.     Cervical back: Normal range of motion and neck supple.  Skin:    General: Skin is warm and dry.     Capillary Refill: Capillary refill takes less than 2 seconds.     Comments: Patient with significant bruising around recent  surgical site on the left upper extremity.  Palpable thrill noted.  Patient is neurovascularly intact.  There does appear to be some erythema surrounding the incision site and serosanguineous drainage.  +2 radial pulses bilaterally.  Neurological:     General: No focal deficit present.     Mental Status: She is alert and oriented to person, place, and time.  Psychiatric:        Mood and Affect: Mood normal.     (all labs ordered are listed, but only abnormal results are displayed) Labs Reviewed  COMPREHENSIVE METABOLIC PANEL WITH GFR - Abnormal; Notable for the following components:      Result Value   Chloride 96 (*)    Glucose, Bld 104 (*)    Creatinine, Ser 1.59 (*)    GFR, Estimated 35 (*)    All other components within normal limits  CBC WITH DIFFERENTIAL/PLATELET - Abnormal; Notable for the following components:   RBC 3.01 (*)    Hemoglobin 8.8 (*)    HCT 27.6 (*)    All other components within normal limits  CULTURE, BLOOD (ROUTINE X 2)  CULTURE, BLOOD (ROUTINE X 2)  PROTIME-INR  URINALYSIS, W/ REFLEX TO CULTURE (INFECTION SUSPECTED)  I-STAT CG4 LACTIC ACID, ED     EKG: None  Radiology: DG Chest 2 View Result Date: 10/14/2023 CLINICAL DATA:  Fever EXAM: CHEST - 2 VIEW COMPARISON:  03/27/2022 FINDINGS: Right dialysis catheter in place with the tip in the right atrium. No pneumothorax. Prior median sternotomy. Heart and mediastinal contours are within normal limits. No focal opacities or effusions. No acute bony abnormality. IMPRESSION: No active cardiopulmonary disease. Electronically Signed   By: Franky Crease M.D.   On: 10/14/2023 17:10     Procedures   Medications Ordered in the ED  acetaminophen  (TYLENOL ) tablet 650 mg (650 mg Oral Patient Refused/Not Given 10/14/23 2019)  vancomycin  (VANCOCIN ) IVPB 1000 mg/200 mL premix (1,000 mg Intravenous New Bag/Given 10/14/23 2033)  fentaNYL  (SUBLIMAZE ) injection 50 mcg (50 mcg Intravenous Given 10/14/23 2015)  ondansetron  (ZOFRAN ) injection 4 mg (4 mg Intravenous Given 10/14/23 2015)                                    Medical Decision Making Amount and/or Complexity of Data Reviewed Labs: ordered. Radiology: ordered.  This patient presents to the ED for concern of surgical site infection, this involves an extensive number of treatment options, and is a complaint that carries with it a high risk of complications and morbidity.  The differential diagnosis includes surgical site infection, sepsis   Co morbidities / Chronic conditions that complicate the patient evaluation  ESRD and diabetes   Additional history obtained:  Additional history obtained from EMR External records from outside source obtained and reviewed including vascular notes   Lab Tests:  I Ordered, and personally interpreted labs.  The pertinent results include: Lactic acid 1.0, anemia 8.8 which is around baseline per historical values, pro time and INR WNL, elevated creatinine 1.59, decreased GFR 35,   Imaging Studies ordered:  I ordered imaging studies including chest x-ray I independently visualized and interpreted  imaging which showed no active cardiopulmonary disease I agree with the radiologist interpretation   Cardiac Monitoring: / EKG:  The patient was maintained on a cardiac monitor.  I personally viewed and interpreted the cardiac monitored which showed an underlying rhythm of: Sinus rhythm, RAD   Problem List /  ED Course / Critical interventions / Medication management I ordered medication including Tylenol , Zofran , fentanyl , vancomycin  I have reviewed the patients home medicines and have made adjustments as needed   Consultations Obtained:  Consulted vascular surgery, Dr. Sheree who is agreeable to see the patient while admitted and prefer patient be admitted to [facility] so that she can continue to have dialysis. Consulted Hospitalist, Dr. Arthea who is agreeable to admission  Test / Admission - Considered:  Admit     Final diagnoses:  Postoperative infection, unspecified type, initial encounter  ESRD (end stage renal disease) [facility])    ED Discharge Orders     None          Francis Ileana LOISE DEVONNA 10/14/23 2125    Laurice Maude BROCKS, MD 10/14/23 2215

## 2023-10-14 NOTE — Progress Notes (Signed)
 Pharmacy Antibiotic Note  Brittney Pace is a 68 y.o. female admitted on 10/14/2023 with fistula infection.  Pharmacy has been consulted for vancomycin  dosing.  Plan: Vancomycin  1gm IV x 1 then 750mg  q48h (AUC 468.1, Scr 1.59)     Temp (24hrs), Avg:100.3 F (37.9 C), Min:100.3 F (37.9 C), Max:100.3 F (37.9 C)  Recent Labs  Lab 10/11/23 0729 10/14/23 1858 10/14/23 1906  WBC  --  7.8  --   CREATININE 3.50* 1.59*  --   LATICACIDVEN  --   --  1.0    Estimated Creatinine Clearance: 23.5 mL/min (A) (by C-G formula based on SCr of 1.59 mg/dL (H)).    Allergies  Allergen Reactions   Morphine  Anaphylaxis and Shortness Of Breath   Penicillins Hives    Tolerated ANCEF  on 04/30/20 Has patient had a PCN reaction causing immediate rash, facial/tongue/throat swelling, SOB or lightheadedness with hypotension: Yes Has patient had a PCN reaction causing severe rash involving mucus membranes or skin necrosis: No Has patient had a PCN reaction that required hospitalization No Has patient had a PCN reaction occurring within the last 10 years: No If all of the above answers are NO, then may proceed with Cephalosporin use.   Tramadol Anaphylaxis   Acetaminophen  Other (See Comments)    Alters insulin  pump readings    Amitriptyline Other (See Comments)    Severe headache/ out of body feeling   Codeine Other (See Comments)    Severe headaches/ out of body feeling    Ibuprofen Other (See Comments)    Messes up CGM reading on glucose monitor     Krill Oil Diarrhea, Itching and Nausea And Vomiting   Lactose Intolerance (Gi) Diarrhea   Losartan Cough   Propoxyphene Other (See Comments)    Severe headaches / out of body feeling    Shellfish Allergy Diarrhea and Nausea And Vomiting   Statins Other (See Comments)    Muscle pains Other reaction(s): Other   Sulfamethoxazole Other (See Comments)    Mouth ulcers    Sulfites Itching and Other (See Comments)    Mouth ulcers   Ace  Inhibitors Other (See Comments)    Other Reaction(s): Unknown   Fluconazole     Other reaction(s): Contact Dermatitis (intolerance)   Norvasc  [Amlodipine  Besylate] Swelling    Antimicrobials this admission: 9/20 vanc >>  Dose adjustments this admission:   Microbiology results: 9/20 BCx:   Thank you for allowing pharmacy to be a part of this patient's care.  Leeroy Mace RPh 10/14/2023, 10:13 PM

## 2023-10-14 NOTE — ED Triage Notes (Signed)
 Pt had a fistula placed in left arm on 9/17- went to dialysis today and was unable to complete due to being cold and weak. Fistula site is oozing and painful.

## 2023-10-15 DIAGNOSIS — A419 Sepsis, unspecified organism: Secondary | ICD-10-CM

## 2023-10-15 LAB — CBC
HCT: 28 % — ABNORMAL LOW (ref 36.0–46.0)
Hemoglobin: 9.2 g/dL — ABNORMAL LOW (ref 12.0–15.0)
MCH: 29.8 pg (ref 26.0–34.0)
MCHC: 32.9 g/dL (ref 30.0–36.0)
MCV: 90.6 fL (ref 80.0–100.0)
Platelets: 305 K/uL (ref 150–400)
RBC: 3.09 MIL/uL — ABNORMAL LOW (ref 3.87–5.11)
RDW: 13.2 % (ref 11.5–15.5)
WBC: 7.4 K/uL (ref 4.0–10.5)
nRBC: 0 % (ref 0.0–0.2)

## 2023-10-15 LAB — GLUCOSE, CAPILLARY
Glucose-Capillary: 126 mg/dL — ABNORMAL HIGH (ref 70–99)
Glucose-Capillary: 222 mg/dL — ABNORMAL HIGH (ref 70–99)
Glucose-Capillary: 159 mg/dL — ABNORMAL HIGH (ref 70–99)
Glucose-Capillary: 122 mg/dL — ABNORMAL HIGH (ref 70–99)
Glucose-Capillary: 143 mg/dL — ABNORMAL HIGH (ref 70–99)

## 2023-10-15 LAB — BASIC METABOLIC PANEL WITH GFR
Anion gap: 13 (ref 5–15)
BUN: 14 mg/dL (ref 8–23)
CO2: 27 mmol/L (ref 22–32)
Calcium: 8.9 mg/dL (ref 8.9–10.3)
Chloride: 97 mmol/L — ABNORMAL LOW (ref 98–111)
Creatinine, Ser: 2.24 mg/dL — ABNORMAL HIGH (ref 0.44–1.00)
GFR, Estimated: 23 mL/min — ABNORMAL LOW (ref >60)
Glucose, Bld: 120 mg/dL — ABNORMAL HIGH (ref 70–99)
Potassium: 3.7 mmol/L (ref 3.5–5.1)
Sodium: 137 mmol/L (ref 135–145)

## 2023-10-15 LAB — URINALYSIS, W/ REFLEX TO CULTURE (INFECTION SUSPECTED)
Bilirubin Urine: NEGATIVE
Glucose, UA: 50 mg/dL — AB
Hgb urine dipstick: NEGATIVE
Ketones, ur: 5 mg/dL — AB
Leukocytes,Ua: NEGATIVE
Nitrite: NEGATIVE
Protein, ur: >=300 mg/dL — AB
Specific Gravity, Urine: 1.016 (ref 1.005–1.030)
pH: 5 (ref 5.0–8.0)

## 2023-10-15 LAB — MAGNESIUM: Magnesium: 2 mg/dL (ref 1.7–2.4)

## 2023-10-15 LAB — CBG MONITORING, ED: Glucose-Capillary: 101 mg/dL — ABNORMAL HIGH (ref 70–99)

## 2023-10-15 LAB — VITAMIN D 25 HYDROXY (VIT D DEFICIENCY, FRACTURES): Vit D, 25-Hydroxy: 57.29 ng/mL (ref 30–100)

## 2023-10-15 MED ORDER — HYDROXYZINE HCL 10 MG PO TABS
10.0000 mg | ORAL_TABLET | Freq: Four times a day (QID) | ORAL | Status: DC | PRN
  Administered 2023-10-15 – 2023-10-17 (×4): 10 mg via ORAL
  Filled 2023-10-15 (×4): qty 1

## 2023-10-15 MED ORDER — PREDNISOLONE ACETATE 1 % OP SUSP
1.0000 [drp] | Freq: Two times a day (BID) | OPHTHALMIC | Status: DC
  Administered 2023-10-15 – 2023-10-16 (×4): 1 [drp] via OPHTHALMIC
  Filled 2023-10-15: qty 5

## 2023-10-15 MED ORDER — KETOROLAC TROMETHAMINE 0.5 % OP SOLN
1.0000 [drp] | Freq: Two times a day (BID) | OPHTHALMIC | Status: DC
  Administered 2023-10-15 – 2023-10-16 (×4): 1 [drp] via OPHTHALMIC
  Filled 2023-10-15: qty 5

## 2023-10-15 MED ORDER — SODIUM CHLORIDE 0.9% FLUSH
10.0000 mL | Freq: Two times a day (BID) | INTRAVENOUS | Status: DC
  Administered 2023-10-15 – 2023-10-16 (×2): 10 mL

## 2023-10-15 MED ORDER — VANCOMYCIN HCL 500 MG/100ML IV SOLN
500.0000 mg | INTRAVENOUS | Status: DC
  Filled 2023-10-15: qty 100

## 2023-10-15 MED ORDER — SODIUM CHLORIDE 0.9% FLUSH: 10.0000 mL | INTRAVENOUS | Status: DC | PRN

## 2023-10-15 MED ORDER — SODIUM CHLORIDE 0.9% FLUSH
10.0000 mL | Freq: Two times a day (BID) | INTRAVENOUS | Status: DC
  Administered 2023-10-15 – 2023-10-16 (×4): 10 mL

## 2023-10-15 MED ORDER — CHLORHEXIDINE GLUCONATE CLOTH 2 % EX PADS
6.0000 | MEDICATED_PAD | Freq: Every day | CUTANEOUS | Status: DC
Start: 2023-10-15 — End: 2023-10-17
  Administered 2023-10-15 – 2023-10-16 (×2): 6 via TOPICAL

## 2023-10-15 NOTE — Plan of Care (Signed)

## 2023-10-15 NOTE — Plan of Care (Signed)

## 2023-10-15 NOTE — ED Notes (Addendum)
 First CBG was result of an error during obtaining, repeat done from pts right hand CBG was 101

## 2023-10-15 NOTE — Progress Notes (Signed)
 Pharmacy Antibiotic Note  Brittney Pace is a 68 y.o. female admitted on 10/14/2023 with fistula infection.  Pharmacy has been consulted for vancomycin  dosing. Patient is ESRD on HD TTS, dose given 9/17. Loaded on 9/20 with 1000mg . Will place on scheduled vancomycin  with HD (TTS) iwht nex dose on 9/23  Plan: Vancomycin  500mg  TTS  Levels preHD as needed F/u deescalation, blood cxs and changes in HD schedule  Height: 4' 10 (147.3 cm) Weight: 49.2 kg (108 lb 6.4 oz) IBW/kg (Calculated) : 40.9  Temp (24hrs), Avg:98.8 F (37.1 C), Min:98.1 F (36.7 C), Max:100.3 F (37.9 C)  Recent Labs  Lab 10/11/23 0729 10/14/23 1858 10/14/23 1906 10/15/23 0336  WBC  --  7.8  --  7.4  CREATININE 3.50* 1.59*  --  2.24*  LATICACIDVEN  --   --  1.0  --     Estimated Creatinine Clearance: 16.5 mL/min (A) (by C-G formula based on SCr of 2.24 mg/dL (H)).    Allergies  Allergen Reactions   Morphine  Anaphylaxis and Shortness Of Breath   Penicillins Hives    Tolerated ANCEF  on 04/30/20 Has patient had a PCN reaction causing immediate rash, facial/tongue/throat swelling, SOB or lightheadedness with hypotension: Yes Has patient had a PCN reaction causing severe rash involving mucus membranes or skin necrosis: No Has patient had a PCN reaction that required hospitalization No Has patient had a PCN reaction occurring within the last 10 years: No If all of the above answers are NO, then may proceed with Cephalosporin use.   Tramadol Anaphylaxis   Acetaminophen  Other (See Comments)    Alters insulin  pump readings    Amitriptyline Other (See Comments)    Severe headache/ out of body feeling   Codeine Other (See Comments)    Severe headaches/ out of body feeling    Ibuprofen Other (See Comments)    Messes up CGM reading on glucose monitor     Krill Oil Diarrhea, Itching and Nausea And Vomiting   Lactose Intolerance (Gi) Diarrhea   Losartan Cough   Propoxyphene Other (See Comments)    Severe  headaches / out of body feeling    Shellfish Allergy Diarrhea and Nausea And Vomiting   Statins Other (See Comments)    Muscle pains Other reaction(s): Other   Sulfamethoxazole Other (See Comments)    Mouth ulcers    Sulfites Itching and Other (See Comments)    Mouth ulcers   Ace Inhibitors Other (See Comments)    Other Reaction(s): Unknown   Fluconazole     Other reaction(s): Contact Dermatitis (intolerance)   Norvasc  [Amlodipine  Besylate] Swelling    Antimicrobials this admission: 9/20 vanc >>  Dose adjustments this admission:   Microbiology results: 9/20 BCx:   Brittney Pace A. Lyle, PharmD, BCPS, FNKF Clinical Pharmacist [place] Please utilize Amion for appropriate phone number to reach the unit pharmacist [facility])  10/15/2023, 10:00 AM

## 2023-10-15 NOTE — Consult Note (Addendum)
    HPI: 68 year old female presented to the emergency department yesterday for pain in her left upper extremity with drainage.  She states that she called the day prior with concerns for drainage from the left arm incision near the elbow.  She has had chills, cough and sore throat but denies any fevers.  She states that she has had times with left hand feeling cold but she has not had numbness or weakness in the left hand.  ROS: As above  Physical Exam:  Vitals:   10/15/23 0100 10/15/23 0314  BP: (!) 127/51 (!) 136/58  Pulse: 63 64  Resp: 12 18  Temp: 98.6 F (37 C) 98.1 F (36.7 C)  SpO2: 94% 95%   Awake alert oriented Nonlabored respirations Left upper extremity with significant hematoma in the upper arm There is a strong palpable thrill in the left arm graft Incision above the antecubitum has 2 Steri-Strips in place, there appears to be a third which has come off secondary to drainage, the incision does not appear open Nonpalpable left radial or ulnar pulses but left hand is sensation and motor intact  Assessment and plan: 67 year old female postop day 4 from left brachial artery to axillary vein AV graft with hematoma in the left upper extremity with mild drainage without evidence of infection.  Overly with the skin being managed nonoperatively.  I will notify her primary surgeon of admission.  Andriana Casa C. Sheree, MD [facility] Office: [phone] Pager: [phone]  Addendum: I reevaluated patient's wound at bedside and she does have some serosanguineous drainage from both incisions although there is no evidence of of wound dehiscence nor infection.  A gentle Ace wrap was placed.  She is okay for discharge from vascular standpoint and keeping her follow-up in our office.  Chaeli Judy C. Sheree, MD

## 2023-10-15 NOTE — Progress Notes (Signed)
 [facility] Progress Note  Patient: Brittney Pace    FMW:[id number]  DOA: 10/14/2023     Date of Service: the patient was seen and examined on 10/15/2023  Chief Complaint  Patient presents with   Vascular Access Problem   Fever   Brief hospital course: Brittney Pace is a 68 y.o. female with medical history significant for end-stage renal disease on hemodialysis Tuesday Thursdays and Saturdays, type 1 diabetes mellitus, s/p Left arm AV fistula placement 3 days ago. She is getting hemodialysis via HD right cervical catheter.  Patient reported swelling of left arm which is gradually worsening and noticed oozing yesterday so she presented to the ED. Vascular surgery was consulted [facility] was consulted for admission and further management as below.   Assessment and Plan:  # LUE AVF swelling possible infection versus hematoma  - Continue IV vancomycin   -Continue aspirin  and hold off Plavix  for now -Continue pressure bandage Monitor H&H - Vascular surgery consulted    # ESRD on HD: - Consulted nephrology  - She is getting HD TThSat through a catheter in her chest   # DMT1  - Continue insulin  pump   # Hypothyroidism  - Continue Synthroid    # Anemia due to chronic disease - Monitor hemoglobin  # Generalized weakness and difficulty ambulation Check magnesium  and vitamin D  level   Body mass index is 22.66 kg/m.  Interventions:  Diet: Carb modified diet DVT Prophylaxis: scd   Advance goals of care discussion: DNR-limited  Family Communication: family was present at bedside, at the time of interview.  The pt provided permission to discuss medical plan with the family. Opportunity was given to ask question and all questions were answered satisfactorily.   Disposition:  Pt is from home, admitted with left arm swelling, hematoma versus infection, still has swelling and on IV antibiotics, pending vascular surgery intervention, which precludes a safe discharge. Discharge  to home, when stable and cleared by vascular surgery, may need 1-2 more days to prove.  Subjective: No significant events overnight, patient's left arm swelling is getting better, did not notice any worsening.  Still has tenderness and some erythema, bruise but does not seem to be getting worse.  Patient had pressure bandage over the left AV fistula. Patient also mentioned that yesterday she was having weakness of bilateral lower extremities and could not stand up, it happens off-and-on.  Physical Exam: General: NAD, lying comfortably Appear in no distress, affect appropriate Eyes: PERRLA ENT: Oral Mucosa Clear, moist  Neck: no JVD,  Cardiovascular: S1 and S2 Present, no Murmur,  Respiratory: good respiratory effort, Bilateral Air entry equal and Decreased, no Crackles, no wheezes Abdomen: Bowel Sound present, Soft and no tenderness,  Skin: no rashes Extremities: LUE bruise, mild swelling and tenderness, Ace wrap for pressure intact.  no Pedal edema, no calf tenderness Neurologic: without any new focal findings Gait not checked due to patient safety concerns  Vitals:   10/14/23 2130 10/15/23 0100 10/15/23 0314 10/15/23 0859  BP: (!) 140/57 (!) 127/51 (!) 136/58 (!) 118/50  Pulse: 68 63 64 65  Resp: 14 12 18 18   Temp: 98.2 F (36.8 C) 98.6 F (37 C) 98.1 F (36.7 C)   TempSrc: Oral  Oral   SpO2: 94% 94% 95% 98%  Weight:   49.2 kg   Height:   4' 10 (1.473 m)     Intake/Output Summary (Last 24 hours) at 10/15/2023 1429 Last data filed at 10/15/2023 0817 Gross per 24 hour  Intake 240  ml  Output --  Net 240 ml   Filed Weights   10/15/23 0314  Weight: 49.2 kg    Data Reviewed: I have personally reviewed and interpreted daily labs, tele strips, imagings as discussed above. I reviewed all nursing notes, pharmacy notes, vitals, pertinent old records I have discussed plan of care as described above with RN and patient/family.  CBC: Recent Labs  Lab 10/11/23 0729  10/14/23 1858 10/15/23 0336  WBC  --  7.8 7.4  NEUTROABS  --  5.0  --   HGB 9.2* 8.8* 9.2*  HCT 27.0* 27.6* 28.0*  MCV  --  91.7 90.6  PLT  --  288 305   Basic Metabolic Panel: Recent Labs  Lab 10/11/23 0729 10/14/23 1858 10/15/23 0336  NA 135 136 137  K 4.4 3.9 3.7  CL 102 96* 97*  CO2  --  26 27  GLUCOSE 153* 104* 120*  BUN 63* 12 14  CREATININE 3.50* 1.59* 2.24*  CALCIUM   --  9.2 8.9  MG  --   --  2.0    Studies: DG Chest 2 View Result Date: 10/14/2023 CLINICAL DATA:  Fever EXAM: CHEST - 2 VIEW COMPARISON:  03/27/2022 FINDINGS: Right dialysis catheter in place with the tip in the right atrium. No pneumothorax. Prior median sternotomy. Heart and mediastinal contours are within normal limits. No focal opacities or effusions. No acute bony abnormality. IMPRESSION: No active cardiopulmonary disease. Electronically Signed   By: Brittney Pace M.D.   On: 10/14/2023 17:10    Scheduled Meds:  acetaminophen   650 mg Oral Once   aspirin  EC  325 mg Oral Daily   carvedilol   12.5 mg Oral 2 times per day on Sunday Monday Wednesday Friday   Chlorhexidine  Gluconate Cloth  6 each Topical Daily   heparin   5,000 Units Subcutaneous Q8H   insulin  pump   Subcutaneous TID WC, HS, 0200   ketorolac   1 drop Both Eyes BID   pantoprazole   40 mg Oral Daily   prednisoLONE  acetate  1 drop Both Eyes BID   sodium chloride  flush  10-40 mL Intracatheter Q12H   sodium chloride  flush  10-40 mL Intracatheter Q12H   thyroid   60 mg Oral Q0600   Continuous Infusions:  [START ON 10/17/2023] vancomycin      PRN Meds: bisacodyl , hydrOXYzine , melatonin, ondansetron  **OR** ondansetron  (ZOFRAN ) IV, oxyCODONE , sodium chloride  flush, sodium chloride  flush  Time spent: 35 minutes  Author: ELVAN Pace. MD [facility] 10/15/2023 2:29 PM  To reach On-call, see care teams to locate the attending and reach out to them via [email or web address]. If 7PM-7AM, please contact night-coverage If you still have difficulty  reaching the attending provider, please page the [facility] (Director on Call) for [facility] on amion for assistance.

## 2023-10-15 NOTE — Consult Note (Signed)
 Renal Service Consult Note [facility] Brittney JONETTA Fret, MD  Patient: Brittney Pace Date: 10/15/2023 Requesting Physician: Dr. Von  Reason for Consult: ESRD pt w/ chills, fever and recent AVF surgery HPI: The patient is a 68 y.o. year-old w/ PMH as below who presented to the ED yesterday afternoon reporting some bruising and pain from her fistula site on the left upper arm.  The fistula was placed about 3 days prior.  She has also been having some fevers and chills.  She did get most of her dialysis yesterday (Saturday).  In the ED temp was 100.3, RR 21, HR 74, normal lactic acid and WBC.  Dr. Sheree from vascular surgery was consulted and patient was admitted.  We are asked to see for dialysis.   Pt seen in room.  Patient is feeling a little bit better.  She denies any shortness of breath, she is lying flat, no leg swelling.  Dr. Claretta assessment showed a significant hematoma in the left upper arm with a strong thrill in the left arm AV graft.  She had the L upper arm brachial/ axillary AV graft placed by Dr Brittney Pace on 9/17. Left hand sensation was intact as well as motor.  There was some serosanguineous drainage noted in both incisions without evidence of wound dehiscence or infection.   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   Fibromyalgia    Gastroparesis    GERD (gastroesophageal reflux disease)    Headache    migraines   Hypothyroidism    IBS (irritable bowel syndrome)    Idiopathic edema    Iron deficiency anemia 09/04/2019   Lyme disease    Mitral valve prolapse    Myocardial infarction (HCC)    1 major in 1999 and 2 minor  small vessel disease.   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  the second stroke was a lacunar    Past Surgical History  Past Surgical History:  Procedure Laterality Date   ABDOMINAL HYSTERECTOMY     APPENDECTOMY     BREAST SURGERY     B/L biopsy and lumpectomy    CARDIAC CATHETERIZATION     CARPAL TUNNEL RELEASE     CATARACT EXTRACTION W/ INTRAOCULAR LENS IMPLANT     right eye   COLONOSCOPY W/ BIOPSIES AND POLYPECTOMY     CORONARY ARTERY BYPASS GRAFT  coronary artery stents     at LAD and LIMA   DIALYSIS/PERMA CATHETER INSERTION N/A 03/24/2023   Procedure: DIALYSIS/PERMA CATHETER INSERTION;  Surgeon: Brittney Gordy POUR, MD;  Location: [facility] INVASIVE CV LAB;  Service: Cardiovascular;  Laterality: N/A;   ECTOPIC PREGNANCY SURGERY     INSERTION OF ARTERIOVENOUS (AV) ARTEGRAFT ARM Left 10/11/2023   Procedure: LEFT UPPER EXTREMITY, ARTERIOVENOUS GORE-TEX 4-7MM STRETCH GRAFT INSERTION;  Surgeon: Brittney Pace Brittney SAILOR, MD;  Location: [facility] OR;  Service: Vascular;  Laterality: Left;   IR FLUORO GUIDE CV LINE RIGHT  05/22/2023   NASAL SEPTUM SURGERY     OPEN REDUCTION INTERNAL FIXATION (ORIF) DISTAL RADIAL FRACTURE Right 05/06/2015   Procedure: OPEN REDUCTION INTERNAL FIXATION (ORIF) RIGHT DISTAL RADIAL FRACTURE AND REPAIRS AS NEEDED;  Surgeon: Brittney Pagan, MD;  Location: [facility] OR;  Service: Orthopedics;  Laterality: Right;   ORIF  HUMERUS FRACTURE Right 04/30/2020   Procedure: OPEN REDUCTION INTERNAL FIXATION (ORIF) PROXIMAL HUMERUS FRACTURE;  Surgeon: Brittney Drivers, MD;  Location: [facility] ORS;  Service: Orthopedics;  Laterality: Right;    ORIF WRIST FRACTURE Left 11/05/2014   ORIF WRIST FRACTURE Left 11/05/2014   Procedure: OPEN REDUCTION INTERNAL FIXATION (ORIF) LEFT WRIST FRACTURE AND REPAIR AS INDICATED;  Surgeon: Brittney Pagan, MD;  Location: [facility] OR;  Service: Orthopedics;  Laterality: Left;   TRIGGER FINGER RELEASE     TUNNELLED CATHETER EXCHANGE N/A 06/01/2023   Procedure: TUNNELLED CATHETER EXCHANGE;  Surgeon: Brittney Lynwood ORN, MD;  Location: [facility] INVASIVE CV LAB;  Service: Cardiovascular;  Laterality: N/A;   Family History  Family History  Problem Relation Age of Onset   Breast cancer Mother    Migraines Mother    Heart disease Father    Hypertension Father    Breast cancer Sister    Social History  reports that she has quit smoking. Her smoking use included cigarettes. She started smoking about 50 years ago. She has a 50.7 pack-year smoking history. She has never used smokeless tobacco. She reports that she does not currently use alcohol after a past usage of about 7.0 standard drinks of alcohol per week. She reports that she does not use drugs. Allergies  Allergies  Allergen Reactions   Morphine  Anaphylaxis and Shortness Of Breath   Penicillins Hives    Tolerated ANCEF  on 04/30/20 Has patient had a PCN reaction causing immediate rash, facial/tongue/throat swelling, SOB or lightheadedness with hypotension: Yes Has patient had a PCN reaction causing severe rash involving mucus membranes or skin necrosis: No Has patient had a PCN reaction that required hospitalization No Has patient had a PCN reaction occurring within the last 10 years: No If all of the above answers are NO, then may proceed with Cephalosporin use.   Tramadol Anaphylaxis   Acetaminophen  Other (See Comments)    Alters insulin  pump readings     Amitriptyline Other (See Comments)    Severe headache/ out of body feeling   Codeine Other (See Comments)    Severe headaches/ out of body feeling    Ibuprofen Other (See Comments)    Messes up CGM reading on glucose monitor     Krill Oil Diarrhea, Itching and Nausea And Vomiting   Lactose Intolerance (Gi) Diarrhea   Losartan Cough   Propoxyphene Other (See Comments)    Severe headaches / out of body feeling    Shellfish Allergy Diarrhea and Nausea And Vomiting   Statins Other (See Comments)    Muscle pains Other reaction(s): Other   Sulfamethoxazole Other (See  Comments)    Mouth ulcers    Sulfites Itching and Other (See Comments)    Mouth ulcers   Ace Inhibitors Other (See Comments)    Other Reaction(s): Unknown   Fluconazole     Other reaction(s): Contact Dermatitis (intolerance)   Norvasc  [Amlodipine  Besylate] Swelling   Home medications Prior to Admission medications   Medication Sig Start Date End Date Taking? Authorizing Provider  ACCU-CHEK GUIDE test strip 3 (three) times daily. 05/03/21  Yes [provider]  aspirin  EC 325 MG tablet Take 1 tablet (325 mg total) by mouth daily. 03/06/19  Yes Pietro Redell RAMAN, MD  b complex-vitamin c -folic acid  (NEPHRO-VITE) 0.8 MG TABS tablet Take 1 tablet by mouth See admin instructions. Patient states that she takes this medication a couple times a week 04/18/23  Yes [provider]  carvedilol  (COREG ) 12.5 MG tablet TAKE 1 TABLET BY MOUTH TWICE DAILY WITH A MEAL Patient taking differently: Take 12.5 mg by mouth See admin instructions. Patient takes 12.5 twice a day on the days she does not have dialysis (Mon, Wed, Fri, Sun) 04/20/23  Yes Pietro Redell RAMAN, MD  cetirizine (ZYRTEC) 10 MG tablet Take 10 mg by mouth at bedtime. 03/26/21  Yes [provider]  clopidogrel  (PLAVIX ) 75 MG tablet Take 1 tablet (75 mg total) by mouth daily. 10/11/23 10/10/24 Yes Brittney Pace Brittney SAILOR, MD  Continuous Glucose Sensor (DEXCOM G7  SENSOR) MISC Inject 1 each into the skin every 3 (three) days. 11/24/22  Yes [provider]  Darbepoetin Alfa -Albumin (ARANESP  IJ) Inject as directed.   Yes [provider]  diclofenac  Sodium (VOLTAREN ) 1 % GEL Apply 1 Application topically daily as needed (pain). 10/25/18  Yes [provider]  diphenoxylate -atropine  (LOMOTIL ) 2.5-0.025 MG tablet TAKE 1 TABLET BY MOUTH 4 TIMES DAILY AS NEEDED FOR DIARRHEA OR  LOOSE  STOOLS 12/24/20  Yes Ennever, Maude SAUNDERS, MD  esomeprazole (NEXIUM) 20 MG capsule Take 20 mg by mouth every morning.    Yes [provider]  fluocinonide  (LIDEX ) 0.05 % external solution Apply 1 Application topically daily. 08/23/23  Yes Ennever, Maude SAUNDERS, MD  hydrOXYzine  (ATARAX ) 10 MG tablet TAKE 1 TABLET BY MOUTH EVERY 6 HOURS AS NEEDED Patient taking differently: Take 10 mg by mouth every 6 (six) hours as needed for itching. 08/21/23  Yes Ennever, Maude SAUNDERS, MD  ketoconazole  (NIZORAL ) 2 % shampoo Apply 1 Application topically 2 (two) times a week.   Yes [provider]  Melatonin Gummies 2.5 MG CHEW Chew 2.5 mg by mouth at bedtime as needed (sleep).   Yes [provider]  nitroGLYCERIN  (NITROLINGUAL ) 0.4 MG/SPRAY spray Place 1 spray under the tongue every 5 (five) minutes x 3 doses as needed. 02/09/23  Yes Duke, Jon Garre, PA  NOVOLOG  100 UNIT/ML injection Inject 25 Units into the skin continuous. Insulin  pump 02/21/22  Yes [provider]  OVER THE COUNTER MEDICATION Take 1 capsule by mouth at bedtime. Conjugated linoleic acid   Yes [provider]  OVER THE COUNTER MEDICATION Take 4 tablets by mouth at bedtime. Magnesium  Threonate   Yes [provider]  oxyCODONE  (ROXICODONE ) 5 MG immediate release tablet Take 1 tablet (5 mg total) by mouth every 8 (eight) hours as needed. 10/11/23 10/10/24 Yes Brittney Pace Brittney SAILOR, MD  prednisoLONE  acetate (PRED FORTE ) 1 % ophthalmic suspension Place 1 drop into both eyes 2 (two)  times daily.   Yes [provider]  PREMARIN  vaginal cream Place 1 applicator vaginally daily  as needed (irritation). 03/24/21  Yes [provider]  thyroid  (ARMOUR THYROID ) 60 MG tablet Take 60 mg by mouth daily before breakfast. 07/30/21  Yes [provider]  torsemide  ([name] ) 20 MG tablet Take 2 tablets (40 mg total) by mouth daily. Patient taking differently: Take 40 mg by mouth daily as needed (Not on Dialysis days). 10/10/22  Yes Conte, Tessa N, PA-C  valACYclovir  (VALTREX ) 500 MG tablet Take 500 mg by mouth daily as needed (Flair up).   Yes [provider]  dexamethasone  (DECADRON ) 0.1 % ophthalmic solution Place 1 drop into the left eye daily as needed (Left ear daily.). 08/09/23   [provider]  Doxercalciferol (HECTOROL IV) 2 mcg. Patient not taking: Reported on 10/14/2023 04/20/23 04/18/24  [provider]  Glucagon (GVOKE HYPOPEN 2-PACK) 0.5 MG/0.1ML SOAJ Use as needed in case of hypoglycemic emergency Patient not taking: Reported on 10/14/2023 07/19/23   [provider]  ketorolac  (ACULAR ) 0.5 % ophthalmic solution Place 1 drop into both eyes 2 (two) times daily.    [provider]  Methoxy PEG-Epoetin  Beta (MIRCERA IJ) 75 mcg. Patient not taking: Reported on 10/14/2023 05/09/23 05/07/24  [provider]  Multiple Vitamins-Minerals (MULTIVITAMIN WITH MINERALS) tablet Take 1 tablet by mouth daily. + Zinc Patient not taking: Reported on 10/14/2023    [provider]     Vitals:   10/15/23 0100 10/15/23 0314 10/15/23 0859 10/15/23 1659  BP: (!) 127/51 (!) 136/58 (!) 118/50 (!) 127/53  Pulse: 63 64 65 (!) 58  Resp: 12 18 18 17   Temp: 98.6 F (37 C) 98.1 F (36.7 C)  98.4 F (36.9 C)  TempSrc:  Oral    SpO2: 94% 95% 98% 96%  Weight:  49.2 kg    Height:  4' 10 (1.473 m)     Exam Gen alert, no distress, on room air, 96% O2 sat Sclera anicteric, throat clear  No jvd or bruits Chest clear bilat to  bases RRR no MRG Abd soft ntnd no mass or ascites +bs Ext no LE edema Upper LUE is wrapped +bruit over LUA AVG Neuro is alert, Ox 3 , nf    RIJ TDC intact  Home bp meds: Coreg  12.5 bid on non hd days [name]  40mg  po daily prn on non-hd days    OP HD: [facility] TTS 4h  B400   47.6kg   2K bath   TDC  heparin  none  Last OP HD was 9/20, post wt 49.8kg (up 2kg) Coming off usually 0- 1.5 kg over Recent Hb 10.1 > 9.9 > 8.2 since 09/28/23 Not getting esa's    Assessment/ Plan: LUE AVF swelling/ drainage: possible infection vs hematoma. Getting IV vancomycin , pressure bandage. VVS has seen.  ESRD: on HD TTS. Next HD 9/23.  HTN: is taking low dose coreg  12.5 bid. BP's stable.  Volume: is 2 kg over by wts, no sig LE edema per exam.  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  T1DM: per pmd   Brittney Fret  MD [facility] 10/15/2023, 5:02 PM  Recent Labs  Lab 10/14/23 1858 10/15/23 0336  HGB 8.8* 9.2*  ALBUMIN 4.0  --   CALCIUM  9.2 8.9  CREATININE 1.59* 2.24*  K 3.9 3.7   Inpatient medications:  acetaminophen   650 mg Oral Once   aspirin  EC  325 mg Oral Daily   carvedilol   12.5 mg Oral 2 times per day on Sunday Monday Wednesday Friday   Chlorhexidine  Gluconate Cloth  6 each Topical Daily   heparin   5,000 Units Subcutaneous Q8H   insulin  pump   Subcutaneous TID WC, HS, 0200   ketorolac   1 drop Both Eyes BID   pantoprazole   40 mg Oral Daily   prednisoLONE  acetate  1 drop Both Eyes BID   sodium chloride  flush  10-40 mL Intracatheter Q12H   sodium chloride  flush  10-40 mL Intracatheter Q12H   thyroid   60 mg Oral Q0600    [START ON 10/17/2023] vancomycin      bisacodyl , hydrOXYzine , melatonin, ondansetron  **OR** ondansetron  (ZOFRAN ) IV, oxyCODONE , sodium chloride  flush, sodium chloride  flush

## 2023-10-16 DIAGNOSIS — S40022A Contusion of left upper arm, initial encounter: Secondary | ICD-10-CM | POA: Diagnosis not present

## 2023-10-16 LAB — HEPATITIS B SURFACE ANTIGEN: Hepatitis B Surface Ag: NONREACTIVE

## 2023-10-16 LAB — GLUCOSE, CAPILLARY
Glucose-Capillary: 138 mg/dL — ABNORMAL HIGH (ref 70–99)
Glucose-Capillary: 150 mg/dL — ABNORMAL HIGH (ref 70–99)
Glucose-Capillary: 146 mg/dL — ABNORMAL HIGH (ref 70–99)
Glucose-Capillary: 136 mg/dL — ABNORMAL HIGH (ref 70–99)
Glucose-Capillary: 125 mg/dL — ABNORMAL HIGH (ref 70–99)

## 2023-10-16 NOTE — Progress Notes (Signed)
 Pt receives out-pt HD at [facility], TTS, 1130 chair time. Will continue to assist as needed.   Lavanda Roshaunda Starkey Dialysis Navigator [phone]

## 2023-10-16 NOTE — Progress Notes (Signed)
 [facility] Progress Note   Subjective: Seen and examined in room. Afebrile. Keeping left arm elevated. Endorses some continued discomfort but improving.   Objective Vitals:   10/15/23 1700 10/15/23 2107 10/16/23 0700 10/16/23 0847  BP:  113/61 (!) 140/55 (!) 153/64  Pulse: 68 70 61 (!) 59  Resp:  18  13  Temp:  98.3 F (36.8 C) (!) 97.5 F (36.4 C) 97.6 F (36.4 C)  TempSrc:  Oral Oral Oral  SpO2:  95% 100% 100%  Weight:      Height:          Additional Objective Labs: Basic Metabolic Panel: Recent Labs  Lab 10/11/23 0729 10/14/23 1858 10/15/23 0336  NA 135 136 137  K 4.4 3.9 3.7  CL 102 96* 97*  CO2  --  26 27  GLUCOSE 153* 104* 120*  BUN 63* 12 14  CREATININE 3.50* 1.59* 2.24*  CALCIUM   --  9.2 8.9   CBC: Recent Labs  Lab 10/11/23 0729 10/14/23 1858 10/15/23 0336  WBC  --  7.8 7.4  NEUTROABS  --  5.0  --   HGB 9.2* 8.8* 9.2*  HCT 27.0* 27.6* 28.0*  MCV  --  91.7 90.6  PLT  --  288 305   Blood Culture    Component Value Date/Time   SDES  10/14/2023 2026    BLOOD RIGHT FOREARM Performed at [facility], [address], [place], [place] [address]    SPECREQUEST  10/14/2023 2026    BOTTLES DRAWN AEROBIC AND ANAEROBIC Blood Culture adequate volume Performed at [facility], [address], [place], [place] [address]    CULT  10/14/2023 2026    NO GROWTH 2 DAYS Performed at [facility], [address] [place], [place] [address]    REPTSTATUS PENDING 10/14/2023 2026     Physical Exam General: Alert, well appearing, nad Heart: RRR Lungs: Clear, normal wob Abdomen: non -tender Extremities: no LE edema  Dialysis Access: TDC: LUE AVF +bruit; +hematoma, bruising  Medications:  [START ON 10/17/2023] vancomycin       acetaminophen   650 mg Oral Once   aspirin  EC  325 mg Oral Daily   carvedilol   12.5 mg Oral 2 times per day on Sunday Monday Wednesday Friday   Chlorhexidine  Gluconate Cloth  6 each  Topical Daily   heparin   5,000 Units Subcutaneous Q8H   insulin  pump   Subcutaneous TID WC, HS, 0200   ketorolac   1 drop Both Eyes BID   pantoprazole   40 mg Oral Daily   prednisoLONE  acetate  1 drop Both Eyes BID   sodium chloride  flush  10-40 mL Intracatheter Q12H   sodium chloride  flush  10-40 mL Intracatheter Q12H   thyroid   60 mg Oral Q0600    Dialysis Orders:  [facility] TTS 4h  B400   47.6kg   2K bath   TDC  heparin  none  Last OP HD was 9/20, post wt 49.8kg (up 2kg) Coming off usually 0- 1.5 kg over Recent Hb 10.1 > 9.9 > 8.2 since 09/28/23 Not getting esa's  Assessment/Plan: LUE swelling/hematoma s/p left arm brachial -->axillary AV graft placement on 9/17. Has been seen by VVS - no evidence of infection per notes. Manage nonoperatively. Received IV Vanc.  Blood cultures negative.  ESRD: on HD TTS. Next HD 9/23.  HTN: is taking low dose coreg  12.5 bid. BP's stable.  Volume: is 2 kg over by wts, no sig LE  edema per exam.  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 Check phos with HD.  T1DM: per pmd    Brittney Ronnald Acosta PA-C [facility] 10/16/2023,8:55 AM

## 2023-10-16 NOTE — Progress Notes (Signed)
 [facility] Progress Note  Patient: Brittney Pace    FMW:[id number]  DOA: 10/14/2023     Date of Service: the patient was seen and examined on 10/16/2023  Chief Complaint  Patient presents with   Vascular Access Problem   Fever   Brief hospital course: Taiylor Virden is a 68 y.o. female with medical history significant for end-stage renal disease on hemodialysis Tuesday Thursdays and Saturdays, type 1 diabetes mellitus, s/p Left arm AV fistula placement 3 days ago. She is getting hemodialysis via HD right cervical catheter.  Patient reported swelling of left arm which is gradually worsening and noticed oozing yesterday so she presented to the ED. Vascular surgery was consulted TRH was consulted for admission and further management as below.   Assessment and Plan:  # LUE AVF swelling possible infection versus hematoma  s/p left arm brachial -->axillary AV graft placement on 9/17. - Continue IV vancomycin   -Continue aspirin  and hold off Plavix  for now -Continue pressure bandage Monitor H&H - Vascular surgery consulted, recommended continue compression and elevation, no plan for graft excision.    # ESRD on HD: - Consulted nephrology  - She is getting HD TThSat through a catheter in her chest Next hemodialysis will be on 9/23    # DMT1  - Continue insulin  pump   # Hypothyroidism  - Continue Synthroid    # Anemia due to chronic disease - Monitor hemoglobin  # Generalized weakness and difficulty ambulation Check magnesium  and vitamin D  level   Body mass index is 22.66 kg/m.  Interventions:  Diet: Carb modified diet DVT Prophylaxis: scd   Advance goals of care discussion: DNR-limited  Family Communication: family was present at bedside, at the time of interview.  The pt provided permission to discuss medical plan with the family. Opportunity was given to ask question and all questions were answered satisfactorily.   Disposition:  Pt is from home, admitted  with left arm swelling, hematoma versus infection, swelling is improving, patient is on IV antibiotics, vascular surgery recommended no surgical intervention, patient needs hemodialysis tomorrow a.m Discharge to home tomorrow after hemodialysis if remains stable.  Subjective: No significant events overnight, patient feels improvement in the left arm swelling and pain, still has mild pain and tenderness, overall it is improving.  Denied any other active issues.  Physical Exam: General: NAD, lying comfortably Appear in no distress, affect appropriate Eyes: PERRLA ENT: Oral Mucosa Clear, moist  Neck: no JVD,  Cardiovascular: S1 and S2 Present, no Murmur,  Respiratory: good respiratory effort, Bilateral Air entry equal and Decreased, no Crackles, no wheezes Abdomen: Bowel Sound present, Soft and no tenderness,  Skin: no rashes Extremities: LUE bruise/ecchymosis, mild swelling, erythema and tenderness, no Pedal edema, no calf tenderness Neurologic: without any new focal findings Gait not checked due to patient safety concerns  Vitals:   10/15/23 1700 10/15/23 2107 10/16/23 0700 10/16/23 0847  BP:  113/61 (!) 140/55 (!) 153/64  Pulse: 68 70 61 (!) 59  Resp:  18  13  Temp:  98.3 F (36.8 C) (!) 97.5 F (36.4 C) 97.6 F (36.4 C)  TempSrc:  Oral Oral Oral  SpO2:  95% 100% 100%  Weight:      Height:       No intake or output data in the 24 hours ending 10/16/23 1535  Filed Weights   10/15/23 0314  Weight: 49.2 kg    Data Reviewed: I have personally reviewed and interpreted daily labs, tele strips, imagings as discussed above.  I reviewed all nursing notes, pharmacy notes, vitals, pertinent old records I have discussed plan of care as described above with RN and patient/family.  CBC: Recent Labs  Lab 10/11/23 0729 10/14/23 1858 10/15/23 0336  WBC  --  7.8 7.4  NEUTROABS  --  5.0  --   HGB 9.2* 8.8* 9.2*  HCT 27.0* 27.6* 28.0*  MCV  --  91.7 90.6  PLT  --  288 305    Basic Metabolic Panel: Recent Labs  Lab 10/11/23 0729 10/14/23 1858 10/15/23 0336  NA 135 136 137  K 4.4 3.9 3.7  CL 102 96* 97*  CO2  --  26 27  GLUCOSE 153* 104* 120*  BUN 63* 12 14  CREATININE 3.50* 1.59* 2.24*  CALCIUM   --  9.2 8.9  MG  --   --  2.0    Studies: No results found.   Scheduled Meds:  acetaminophen   650 mg Oral Once   aspirin  EC  325 mg Oral Daily   carvedilol   12.5 mg Oral 2 times per day on Sunday Monday Wednesday Friday   Chlorhexidine  Gluconate Cloth  6 each Topical Daily   heparin   5,000 Units Subcutaneous Q8H   insulin  pump   Subcutaneous TID WC, HS, 0200   ketorolac   1 drop Both Eyes BID   pantoprazole   40 mg Oral Daily   prednisoLONE  acetate  1 drop Both Eyes BID   sodium chloride  flush  10-40 mL Intracatheter Q12H   sodium chloride  flush  10-40 mL Intracatheter Q12H   thyroid   60 mg Oral Q0600   Continuous Infusions:  [START ON 10/17/2023] vancomycin      PRN Meds: bisacodyl , hydrOXYzine , melatonin, ondansetron  **OR** ondansetron  (ZOFRAN ) IV, oxyCODONE , sodium chloride  flush, sodium chloride  flush  Time spent: 35 minutes  Author: ELVAN SOR. MD [facility] 10/16/2023 3:35 PM  To reach On-call, see care teams to locate the attending and reach out to them via [email or web address]. If 7PM-7AM, please contact night-coverage If you still have difficulty reaching the attending provider, please page the [facility] (Director on Call) for [facility] on amion for assistance.

## 2023-10-16 NOTE — Inpatient Diabetes Management (Addendum)
 Inpatient Diabetes Program Recommendations  AACE/ADA: New Consensus Statement on Inpatient Glycemic Control (2015)  Target Ranges:  Prepandial:   less than 140 mg/dL      Peak postprandial:   less than 180 mg/dL (1-2 hours)      Critically ill patients:  140 - 180 mg/dL   Lab Results  Component Value Date   GLUCAP 146 (H) 10/16/2023   HGBA1C 9.6 (H) 12/24/2020    Review of Glycemic Control  Latest Reference Range & Units 10/15/23 16:57 10/15/23 21:06 10/16/23 04:47 10/16/23 07:48 10/16/23 11:41  Glucose-Capillary 70 - 99 mg/dL 877 (H) 856 (H) 861 (H) 150 (H) 146 (H)   Diabetes history: Type 1 DM Outpatient Diabetes medications:  Insulin  Pump: Basal Rate  Total Basal Dose: 5.4 units/day  Time units/hr  12:00 AM 0.1  4:00 AM 0.1  9:00 AM 0.11  12:00 PM 0.36  7:00 PM 0.33   Blood Glucose Target  Time mg/dL  87:99 AM 849 - 849   Sensitivity Factor  Time mg/dL/unit  87:99 AM 889   Carb Ratio  Time g/unit  12:00 AM 18  Current orders for Inpatient glycemic control:  Insulin  pump orders  Inpatient Diabetes Program Recommendations:    Agree with orders.  CBG's well controlled.  Addendum 1430- Spoke to patient and confirmed insulin  pump settings.  She has supplies at bedside.  No needs noted at this time.   Thanks,  Randall Bullocks, RN, BC-ADM Inpatient Diabetes Coordinator Pager [phone]  (8a-5p)

## 2023-10-16 NOTE — Progress Notes (Signed)
 Patient ID: Brittney Pace, female   DOB: May 08, 1955, 68 y.o.   MRN: [id number]  Seen on morning rounds. Compression wrap discontinued by nurse. Left arm ecchymotic, but otherwise reassuring exam. No evidence of ongoing drainage on my exam. No cellulitis or purulence to suggest infection. Continue compression and elevation. No plans for graft excision.   [name]. Magda, MD [facility] Phone Number: [phone] 10/16/2023 8:32 AM

## 2023-10-17 ENCOUNTER — Other Ambulatory Visit (HOSPITAL_COMMUNITY): Payer: Self-pay

## 2023-10-17 ENCOUNTER — Encounter: Payer: Self-pay | Admitting: Family

## 2023-10-17 DIAGNOSIS — A419 Sepsis, unspecified organism: Secondary | ICD-10-CM | POA: Diagnosis not present

## 2023-10-17 LAB — CBG MONITORING, ED: Glucose-Capillary: 539 mg/dL (ref 70–99)

## 2023-10-17 LAB — CBC
HCT: 22.6 % — ABNORMAL LOW (ref 36.0–46.0)
Hemoglobin: 7.7 g/dL — ABNORMAL LOW (ref 12.0–15.0)
MCH: 30 pg (ref 26.0–34.0)
MCHC: 34.1 g/dL (ref 30.0–36.0)
MCV: 87.9 fL (ref 80.0–100.0)
Platelets: 293 K/uL (ref 150–400)
RBC: 2.57 MIL/uL — ABNORMAL LOW (ref 3.87–5.11)
RDW: 12.7 % (ref 11.5–15.5)
WBC: 6.3 K/uL (ref 4.0–10.5)
nRBC: 0 % (ref 0.0–0.2)

## 2023-10-17 LAB — BASIC METABOLIC PANEL WITH GFR
Anion gap: 9 (ref 5–15)
BUN: 41 mg/dL — ABNORMAL HIGH (ref 8–23)
CO2: 25 mmol/L (ref 22–32)
Calcium: 8.8 mg/dL — ABNORMAL LOW (ref 8.9–10.3)
Chloride: 96 mmol/L — ABNORMAL LOW (ref 98–111)
Creatinine, Ser: 3.72 mg/dL — ABNORMAL HIGH (ref 0.44–1.00)
GFR, Estimated: 13 mL/min — ABNORMAL LOW (ref >60)
Glucose, Bld: 140 mg/dL — ABNORMAL HIGH (ref 70–99)
Potassium: 4.1 mmol/L (ref 3.5–5.1)
Sodium: 130 mmol/L — ABNORMAL LOW (ref 135–145)

## 2023-10-17 LAB — MAGNESIUM: Magnesium: 1.8 mg/dL (ref 1.7–2.4)

## 2023-10-17 LAB — HEPATITIS B SURFACE ANTIBODY, QUANTITATIVE: Hep B S AB Quant (Post): <3.5 m[IU]/mL — ABNORMAL LOW

## 2023-10-17 LAB — GLUCOSE, CAPILLARY: Glucose-Capillary: 119 mg/dL — ABNORMAL HIGH (ref 70–99)

## 2023-10-17 LAB — PHOSPHORUS: Phosphorus: 4.8 mg/dL — ABNORMAL HIGH (ref 2.5–4.6)

## 2023-10-17 MED ORDER — DOXYCYCLINE HYCLATE 100 MG PO TABS
100.0000 mg | ORAL_TABLET | Freq: Two times a day (BID) | ORAL | 0 refills | Status: AC
  Filled 2023-10-17: qty 10, 5d supply, fill #0

## 2023-10-17 MED ORDER — ANTICOAGULANT SODIUM CITRATE 4% (200MG/5ML) IV SOLN: 5.0000 mL | Status: DC | PRN

## 2023-10-17 MED ORDER — HEPARIN SODIUM (PORCINE) 1000 UNIT/ML DIALYSIS: 1000.0000 [IU] | INTRAMUSCULAR | Status: DC | PRN

## 2023-10-17 MED ORDER — ALTEPLASE 2 MG IJ SOLR: 2.0000 mg | Freq: Once | INTRAMUSCULAR | Status: DC | PRN

## 2023-10-17 MED ORDER — VANCOMYCIN HCL 500 MG/100ML IV SOLN
INTRAVENOUS | Status: AC
  Filled 2023-10-17: qty 100

## 2023-10-17 MED ORDER — CEFDINIR 300 MG PO CAPS
300.0000 mg | ORAL_CAPSULE | ORAL | 0 refills | Status: AC
Start: 2023-10-18 — End: 2023-10-23
  Filled 2023-10-17: qty 3, 6d supply, fill #0

## 2023-10-17 MED ORDER — HEPARIN SODIUM (PORCINE) 1000 UNIT/ML IJ SOLN
INTRAMUSCULAR | Status: AC
  Filled 2023-10-17: qty 4

## 2023-10-17 NOTE — Progress Notes (Signed)
 [facility] Progress Note   Subjective:  Getting ready to start dialysis. No acute events overnight. Hoping to go home today after dialysis.   Objective Vitals:   10/16/23 0700 10/16/23 0847 10/16/23 1747 10/16/23 1951  BP: (!) 140/55 (!) 153/64 (!) 109/48 (!) 133/59  Pulse: 61 (!) 59 73 (!) 57  Resp:  13 18 16   Temp: (!) 97.5 F (36.4 C) 97.6 F (36.4 C) 98.1 F (36.7 C) 98.2 F (36.8 C)  TempSrc: Oral Oral  Oral  SpO2: 100% 100% 100% 97%  Weight:      Height:         Additional Objective Labs: Basic Metabolic Panel: Recent Labs  Lab 10/14/23 1858 10/15/23 0336 10/17/23 0350  NA 136 137 130*  K 3.9 3.7 4.1  CL 96* 97* 96*  CO2 26 27 25   GLUCOSE 104* 120* 140*  BUN 12 14 41*  CREATININE 1.59* 2.24* 3.72*  CALCIUM  9.2 8.9 8.8*  PHOS  --   --  4.8*   CBC: Recent Labs  Lab 10/14/23 1858 10/15/23 0336 10/17/23 0350  WBC 7.8 7.4 6.3  NEUTROABS 5.0  --   --   HGB 8.8* 9.2* 7.7*  HCT 27.6* 28.0* 22.6*  MCV 91.7 90.6 87.9  PLT 288 305 293   Blood Culture    Component Value Date/Time   SDES  10/14/2023 2026    BLOOD RIGHT FOREARM Performed at [facility], [address], [place], [place] [address]    SPECREQUEST  10/14/2023 2026    BOTTLES DRAWN AEROBIC AND ANAEROBIC Blood Culture adequate volume Performed at [facility], [address] [place], [place] [address]    CULT  10/14/2023 2026    NO GROWTH 3 DAYS Performed at [facility], [address], [place], [place] [address]    REPTSTATUS PENDING 10/14/2023 2026     Physical Exam General: Alert, well appearing, nad Heart: RRR Lungs: Clear, normal wob Abdomen: non -tender Extremities: no LE edema  Dialysis Access: TDC: LUE AVF +bruit; +hematoma, bruising  Medications:  anticoagulant sodium citrate      vancomycin       acetaminophen   650 mg Oral Once   aspirin  EC  325 mg Oral Daily   carvedilol   12.5 mg Oral 2 times per day on Sunday Monday  Wednesday Friday   Chlorhexidine  Gluconate Cloth  6 each Topical Daily   heparin   5,000 Units Subcutaneous Q8H   insulin  pump   Subcutaneous TID WC, HS, 0200   ketorolac   1 drop Both Eyes BID   pantoprazole   40 mg Oral Daily   prednisoLONE  acetate  1 drop Both Eyes BID   sodium chloride  flush  10-40 mL Intracatheter Q12H   sodium chloride  flush  10-40 mL Intracatheter Q12H   thyroid   60 mg Oral Q0600    Dialysis Orders:  [facility] TTS 4h  B400   47.6kg   2K bath   TDC  heparin  none  Last OP HD was 9/20, post wt 49.8kg (up 2kg) Coming off usually 0- 1.5 kg over Recent Hb 10.1 > 9.9 > 8.2 since 09/28/23 Not getting esa's  Assessment/Plan: LUE swelling/hematoma s/p left arm brachial -->axillary AV graft placement on 9/17. Has been seen by [facility] - no evidence of infection per notes. Manage nonoperatively. IV Vanc ordered.   Blood cultures negative.  ESRD: on HD TTS. Next HD 9/23.  HTN: is taking low dose coreg  12.5 bid. BP's stable.  Volume: is 2 kg over by wts, no sig LE edema per exam.  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 Transfuse prn. Resume ESA as outpatient.  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 Ca/Phos acceptable.  T1DM: per pmd    Maisie Ronnald Acosta PA-C [facility] 10/17/2023,8:30 AM

## 2023-10-17 NOTE — Discharge Summary (Signed)
 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 type 2 diabetes mellitus (HCC)   Erythropoietin  deficiency anemia   ESRD on dialysis (HCC)   NPH (normal pressure hydrocephalus) (HCC)   Admitted From: Home Disposition:  Home   Recommendations for Outpatient Follow-up:  Follow-up with PCP in 1 week Follow-up with nephrology and continue hemodialysis TTS schedule Follow-up with vascular surgery in 1 week Follow up LABS/TEST:  as above   Diet recommendation: Cardiac and Carb modified diet  Activity: The patient is advised to gradually reintroduce usual activities, as tolerated  Discharge Condition: stable  Code Status: Full code   History of present illness: As per the H and P dictated on admission.   Hospital Course:  Mckinna Demars is a 68 y.o. female with medical history significant for end-stage renal disease on hemodialysis Tuesday Thursdays and Saturdays, type 1 diabetes mellitus, s/p Left arm AV fistula placement 3 days ago. She is getting hemodialysis via HD right cervical catheter.  Patient reported swelling of left arm which is gradually worsening and noticed oozing yesterday so she presented to the ED. Vascular surgery was consulted TRH was consulted for admission and further management as below.     Assessment and Plan:   # LUE AVF swelling most likely due to hematoma/seroma due to postoperative taking, cannot exclude cellulitis.  s/p left arm brachial -->axillary AV graft placement on  9/17. S/p IV vancomycin  given during hospital stay.  Blood culture NGTD. Continue aspirin  and Plavix  was held during hospital stay but resumed on discharge.  Pressure bandage was applied, swelling is getting better now, still patient has mild tenderness. Patient was cleared by vascular surgery to discharge and follow-up as an outpatient, no any surgical intervention required. Patient was discharged on oral antibiotics doxycycline  and Omnicef  for 5 days, empirically for possible any underlying infection.   # ESRD on HD: Consulted nephrology, She is getting HD TThSat through a catheter in her chest. hemodialysis done today 9/23   # DMT1: Continue insulin  pump # Hypothyroidism: Continue Synthroid  # Anemia due to chronic disease: Monitor hemoglobin # Generalized weakness and difficulty ambulation Magnesium  and vitamin D  level wnl   Body mass index is 20.73 kg/m.  Nutrition Interventions:  - Patient was instructed, not to drive, operate heavy machinery, perform activities at heights, swimming or participation in water activities or provide baby sitting services while on Pain, Sleep and Anxiety Medications; until her outpatient Physician has advised to do so again.  - Also recommended to not to take more than prescribed Pain, Sleep and Anxiety Medications.  Patient was ambulatory without any assistance. On the day of the discharge the patient's vitals were stable, and no other acute medical condition were reported by patient. the patient was felt safe to be discharge at Home.  Consultants: Nephrology, vascular surgery Procedures: Dialysis  Discharge Exam: General: Appear in no distress, Oral Mucosa Clear, moist. Cardiovascular: S1 and S2 Present, no Murmur, Respiratory: normal respiratory effort, Bilateral Air entry present and no Crackles, no wheezes Abdomen:  Bowel Sound present, Soft and no tenderness. Extremities: no Pedal edema, no calf tenderness Neurology: alert and oriented to  time, place, and person affect appropriate.  Filed Weights   10/15/23 0314 10/17/23 0900  Weight: 49.2 kg 45 kg   Vitals:   10/17/23 1200 10/17/23 1230  BP: (!) 145/56 (!) 147/53  Pulse: (!) 56 (!) 58  Resp: 10 17  Temp:  97.9 F (36.6 C)  SpO2: 100% 100%    DISCHARGE MEDICATION: Allergies as of 10/17/2023       Reactions   Morphine  Anaphylaxis, Shortness Of Breath   Penicillins Hives   Tolerated ANCEF  on 04/30/20 Has patient had a PCN reaction causing immediate rash, facial/tongue/throat swelling, SOB or lightheadedness with hypotension: Yes Has patient had a PCN reaction causing severe rash involving mucus membranes or skin necrosis: No Has patient had a PCN reaction that required hospitalization No Has patient had a PCN reaction occurring within the last 10 years: No If all of the above answers are NO, then may proceed with Cephalosporin use.   Tramadol Anaphylaxis   Acetaminophen  Other (See Comments)   Alters insulin  pump readings   Amitriptyline Other (See Comments)   Severe headache/ out of body feeling   Codeine Other (See Comments)   Severe headaches/ out of body feeling   Ibuprofen Other (See Comments)   Messes up CGM reading on glucose monitor   Krill Oil Diarrhea, Itching, Nausea And Vomiting   Lactose Intolerance (gi) Diarrhea   Losartan Cough   Propoxyphene Other (See Comments)   Severe headaches / out of body feeling   Shellfish Allergy Diarrhea, Nausea And Vomiting   Statins Other (See Comments)   Muscle pains Other reaction(s): Other   Sulfamethoxazole Other (See Comments)   Mouth ulcers   Sulfites Itching, Other (See Comments)   Mouth ulcers   Ace Inhibitors Other (See Comments)   Other Reaction(s): Unknown   Fluconazole    Other reaction(s): Contact Dermatitis (intolerance)   Norvasc  [amlodipine  Besylate] Swelling        Medication List     STOP taking these medications    Gvoke HypoPen 2-Pack 0.5 MG/0.1ML Soaj Generic drug:  Glucagon   HECTOROL IV   MIRCERA IJ   multivitamin with minerals tablet       TAKE these medications    Accu-Chek Guide test strip Generic drug: glucose blood 3 (three) times daily.   ARANESP  IJ Inject as directed.   Armour Thyroid  60 MG tablet Generic drug: thyroid  Take 60 mg by mouth daily before breakfast.   aspirin  EC 325 MG tablet Take 1 tablet (325 mg total) by mouth daily.   b complex-vitamin c -folic acid  0.8 MG Tabs tablet Take 1 tablet by mouth See admin instructions. Patient states that she takes this medication a couple times a week   carvedilol  12.5 MG tablet Commonly known as: COREG  TAKE 1 TABLET BY MOUTH TWICE DAILY WITH A MEAL What changed:  when to take this additional instructions   cefdinir  300 MG capsule Commonly known as: OMNICEF  Take 1 capsule (300 mg total) by mouth every other day for 3 doses. Start taking on: October 18, 2023   cetirizine 10 MG tablet Commonly known as: ZYRTEC Take 10 mg by mouth at bedtime.   clopidogrel  75 MG tablet Commonly known as: Plavix  Take 1 tablet (75 mg total) by mouth daily.   dexamethasone  0.1 % ophthalmic solution Commonly known as: DECADRON  Place 1 drop into the left eye daily as needed (Left  ear daily.).   Dexcom G7 Sensor Misc Inject 1 each into the skin every 3 (three) days.   diclofenac  Sodium 1 % Gel Commonly known as: VOLTAREN  Apply 1 Application topically daily as needed (pain).   diphenoxylate -atropine  2.5-0.025 MG tablet Commonly known as: LOMOTIL  TAKE 1 TABLET BY MOUTH 4 TIMES DAILY AS NEEDED FOR DIARRHEA OR  LOOSE  STOOLS   doxycycline  100 MG tablet Commonly known as: ADOXA Take 1 tablet (100 mg total) by mouth 2 (two) times daily for 5 days. Start taking on: October 18, 2023   esomeprazole 20 MG capsule Commonly known as: NEXIUM Take 20 mg by mouth every morning.   fluocinonide  0.05 % external solution Commonly known as: LIDEX  Apply 1 Application topically daily.    hydrOXYzine  10 MG tablet Commonly known as: ATARAX  TAKE 1 TABLET BY MOUTH EVERY 6 HOURS AS NEEDED What changed: reasons to take this   ketoconazole  2 % shampoo Commonly known as: NIZORAL  Apply 1 Application topically 2 (two) times a week.   ketorolac  0.5 % ophthalmic solution Commonly known as: ACULAR  Place 1 drop into both eyes 2 (two) times daily.   Melatonin Gummies 2.5 MG Chew Chew 2.5 mg by mouth at bedtime as needed (sleep).   nitroGLYCERIN  0.4 MG/SPRAY spray Commonly known as: NITROLINGUAL  Place 1 spray under the tongue every 5 (five) minutes x 3 doses as needed.   NovoLOG  100 UNIT/ML injection Generic drug: insulin  aspart Inject 25 Units into the skin continuous. Insulin  pump   OVER THE COUNTER MEDICATION Take 1 capsule by mouth at bedtime. Conjugated linoleic acid   OVER THE COUNTER MEDICATION Take 4 tablets by mouth at bedtime. Magnesium  Threonate   oxyCODONE  5 MG immediate release tablet Commonly known as: Roxicodone  Take 1 tablet (5 mg total) by mouth every 8 (eight) hours as needed.   prednisoLONE  acetate 1 % ophthalmic suspension Commonly known as: PRED FORTE  Place 1 drop into both eyes 2 (two) times daily.   Premarin  vaginal cream Generic drug: conjugated estrogens  Place 1 applicator vaginally daily as needed (irritation).   torsemide  20 MG tablet Commonly known as: [name]  Take 2 tablets (40 mg total) by mouth daily. What changed:  when to take this reasons to take this   valACYclovir  500 MG tablet Commonly known as: VALTREX  Take 500 mg by mouth daily as needed (Flair up).       Allergies  Allergen Reactions   Morphine  Anaphylaxis and Shortness Of Breath   Penicillins Hives    Tolerated ANCEF  on 04/30/20 Has patient had a PCN reaction causing immediate rash, facial/tongue/throat swelling, SOB or lightheadedness with hypotension: Yes Has patient had a PCN reaction causing severe rash involving mucus membranes or skin necrosis: No Has  patient had a PCN reaction that required hospitalization No Has patient had a PCN reaction occurring within the last 10 years: No If all of the above answers are NO, then may proceed with Cephalosporin use.   Tramadol Anaphylaxis   Acetaminophen  Other (See Comments)    Alters insulin  pump readings    Amitriptyline Other (See Comments)    Severe headache/ out of body feeling   Codeine Other (See Comments)    Severe headaches/ out of body feeling    Ibuprofen Other (See Comments)    Messes up CGM reading on glucose monitor     Krill Oil Diarrhea, Itching and Nausea And Vomiting   Lactose Intolerance (Gi) Diarrhea   Losartan Cough   Propoxyphene Other (See Comments)    Severe  headaches / out of body feeling    Shellfish Allergy Diarrhea and Nausea And Vomiting   Statins Other (See Comments)    Muscle pains Other reaction(s): Other   Sulfamethoxazole Other (See Comments)    Mouth ulcers    Sulfites Itching and Other (See Comments)    Mouth ulcers   Ace Inhibitors Other (See Comments)    Other Reaction(s): Unknown   Fluconazole     Other reaction(s): Contact Dermatitis (intolerance)   Norvasc  [Amlodipine  Besylate] Swelling   Discharge Instructions     Call MD for:  difficulty breathing, headache or visual disturbances   Complete by: As directed    Call MD for:  extreme fatigue   Complete by: As directed    Call MD for:  persistant dizziness or light-headedness   Complete by: As directed    Call MD for:  persistant nausea and vomiting   Complete by: As directed    Call MD for:  redness, tenderness, or signs of infection (pain, swelling, redness, odor or green/yellow discharge around incision site)   Complete by: As directed    Call MD for:  severe uncontrolled pain   Complete by: As directed    Call MD for:  temperature >100.4   Complete by: As directed    Diet renal/carb modified with fluid restriction   Complete by: As directed    Discharge instructions   Complete  by: As directed    Follow-up with PCP in 1 week Follow-up with nephrology and continue hemodialysis TTS schedule Follow-up with vascular surgery in 1 week   Increase activity slowly   Complete by: As directed    No wound care   Complete by: As directed        The results of significant diagnostics from this hospitalization (including imaging, microbiology, ancillary and laboratory) are listed below for reference.    Significant Diagnostic Studies: DG Chest 2 View Result Date: 10/14/2023 CLINICAL DATA:  Fever EXAM: CHEST - 2 VIEW COMPARISON:  03/27/2022 FINDINGS: Right dialysis catheter in place with the tip in the right atrium. No pneumothorax. Prior median sternotomy. Heart and mediastinal contours are within normal limits. No focal opacities or effusions. No acute bony abnormality. IMPRESSION: No active cardiopulmonary disease. Electronically Signed   By: Franky Crease M.D.   On: 10/14/2023 17:10   VAS US  UPPER EXT VEIN MAPPING (PRE-OP  AVF) Result Date: 10/03/2023 UPPER EXTREMITY VEIN MAPPING Patient Name:  Odile Veloso  Date of Exam:   10/03/2023 Medical Rec #: [id number]         Accession #:    [id number] Date of Birth: May 14, 1955         Patient Gender: F Patient Age:   68 years Exam Location:  [address] Procedure:      VAS US  UPPER EXT VEIN MAPPING (PRE-OP  AVF) Referring Phys: DEBBY ROBERTSON --------------------------------------------------------------------------------  Indications: ESRD. Comparison Study: N/A Performing Technologist: Dena Pane  Examination Guidelines: A complete evaluation includes B-mode imaging, spectral Doppler, color Doppler, and power Doppler as needed of all accessible portions of each vessel. Bilateral testing is considered an integral part of a complete examination. Limited examinations for reoccurring indications may be performed as noted. +-----------------+-------------+----------+--------+ Right Cephalic   Diameter (cm)Depth (cm)Findings  +-----------------+-------------+----------+--------+ Prox upper arm       0.12        0.54            +-----------------+-------------+----------+--------+ Mid upper arm        0.23  0.16            +-----------------+-------------+----------+--------+ Dist upper arm       0.18        0.34            +-----------------+-------------+----------+--------+ Antecubital fossa    0.39        0.31            +-----------------+-------------+----------+--------+ Prox forearm         0.17        0.21            +-----------------+-------------+----------+--------+ Mid forearm          0.17        0.46            +-----------------+-------------+----------+--------+ Dist forearm         0.13        0.18            +-----------------+-------------+----------+--------+ +-----------------+-------------+----------+---------+ Right Basilic    Diameter (cm)Depth (cm)Findings  +-----------------+-------------+----------+---------+ Mid upper arm        0.25        0.55             +-----------------+-------------+----------+---------+ Dist upper arm       0.30        0.37             +-----------------+-------------+----------+---------+ Antecubital fossa    0.08        0.17             +-----------------+-------------+----------+---------+ Prox forearm         0.05        0.17             +-----------------+-------------+----------+---------+ Mid forearm          0.06        0.16   branching +-----------------+-------------+----------+---------+ Distal forearm       0.05        0.15   branching +-----------------+-------------+----------+---------+ +-----------------+-------------+----------+-----------------------------------+ Left Cephalic    Diameter (cm)Depth (cm)             Findings               +-----------------+-------------+----------+-----------------------------------+ Shoulder             0.14        1.64                                        +-----------------+-------------+----------+-----------------------------------+ Prox upper arm       0.10        0.54                                       +-----------------+-------------+----------+-----------------------------------+ Mid upper arm        0.10        0.31   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          +-----------------+-------------+----------+-----------------------------------+ Dist upper arm       0.10  0.29                                       +-----------------+-------------+----------+-----------------------------------+ Antecubital fossa    0.16        0.33                                       +-----------------+-------------+----------+-----------------------------------+ Prox forearm                                      not visualized            +-----------------+-------------+----------+-----------------------------------+ Mid forearm                                       not visualized            +-----------------+-------------+----------+-----------------------------------+ Dist forearm                                      not visualized            +-----------------+-------------+----------+-----------------------------------+ +-----------------+-------------+----------+--------------+ Left Basilic     Diameter (cm)Depth (cm)   Findings    +-----------------+-------------+----------+--------------+ Prox upper arm       0.35        0.64                  +-----------------+-------------+----------+--------------+ Mid upper arm        0.38        0.59                  +-----------------+-------------+----------+--------------+ Dist upper arm       0.21        0.25                  +-----------------+-------------+----------+--------------+ Antecubital fossa    0.24        0.22                   +-----------------+-------------+----------+--------------+ Prox forearm         0.04        0.16                  +-----------------+-------------+----------+--------------+ Mid forearm                             not visualized +-----------------+-------------+----------+--------------+ Distal forearm                          not visualized +-----------------+-------------+----------+--------------+ Summary: Right: Patent cephalic and basilic vein. Please see diameters above. Left: Patent basilic vein. There is a short segment of chronic       occlusive thrombus in the mid upper arm cephalic vein. Please       see diameters above. *See table(s) above for measurements and observations.  Diagnosing physician: Debby Robertson Electronically signed by Debby Robertson on 10/03/2023 at 5:15:11 PM.    Final     Microbiology: Recent Results (from the past 240 hours)  Blood Culture (  routine x 2)     Status: None (Preliminary result)   Collection Time: 10/14/23  6:55 PM   Specimen: BLOOD  Result Value Ref Range Status   Specimen Description   Final    BLOOD RIGHT ANTECUBITAL Performed at [facility], [address] [place], [place] [address]    Special Requests   Final    BOTTLES DRAWN AEROBIC AND ANAEROBIC Blood Culture results may not be optimal due to an inadequate volume of blood received in culture bottles Performed at [facility], [address] [place], [place] [address]    Culture   Final    NO GROWTH 3 DAYS Performed at [facility], [address], [place], [place] [address]    Report Status PENDING  Incomplete  Blood Culture (routine x 2)     Status: None (Preliminary result)   Collection Time: 10/14/23  8:26 PM   Specimen: BLOOD RIGHT FOREARM  Result Value Ref Range Status   Specimen Description   Final    BLOOD RIGHT FOREARM Performed at [facility], [address] [place], [place] [address]    Special Requests    Final    BOTTLES DRAWN AEROBIC AND ANAEROBIC Blood Culture adequate volume Performed at [facility], [address] [place], [place] [address]    Culture   Final    NO GROWTH 3 DAYS Performed at [facility], [address] [place], [place] [address]    Report Status PENDING  Incomplete     Labs: CBC: Recent Labs  Lab 10/11/23 0729 10/14/23 1858 10/15/23 0336 10/17/23 0350  WBC  --  7.8 7.4 6.3  NEUTROABS  --  5.0  --   --   HGB 9.2* 8.8* 9.2* 7.7*  HCT 27.0* 27.6* 28.0* 22.6*  MCV  --  91.7 90.6 87.9  PLT  --  288 305 293   Basic Metabolic Panel: Recent Labs  Lab 10/11/23 0729 10/14/23 1858 10/15/23 0336 10/17/23 0350  NA 135 136 137 130*  K 4.4 3.9 3.7 4.1  CL 102 96* 97* 96*  CO2  --  26 27 25   GLUCOSE 153* 104* 120* 140*  BUN 63* 12 14 41*  CREATININE 3.50* 1.59* 2.24* 3.72*  CALCIUM   --  9.2 8.9 8.8*  MG  --   --  2.0 1.8  PHOS  --   --   --  4.8*   Liver Function Tests: Recent Labs  Lab 10/14/23 1858  AST 36  ALT 14  ALKPHOS 120  BILITOT 0.4  PROT 6.5  ALBUMIN 4.0   No results for input(s): LIPASE, AMYLASE in the last 168 hours. No results for input(s): AMMONIA in the last 168 hours. Cardiac Enzymes: No results for input(s): CKTOTAL, CKMB, CKMBINDEX, TROPONINI in the last 168 hours. BNP (last 3 results) No results for input(s): BNP in the last 8760 hours. CBG: Recent Labs  Lab 10/16/23 0447 10/16/23 0748 10/16/23 1141 10/16/23 1653 10/16/23 1950  GLUCAP 138* 150* 146* 136* 125*    Time spent: 35 minutes  Signed:  Elvan Sor  [facility] 10/17/2023 1:24 PM

## 2023-10-17 NOTE — Progress Notes (Signed)
 Pt OTF & unavailable for AM line care

## 2023-10-17 NOTE — Discharge Planning (Signed)
 [facility] Patient Discharge Orders- [facility] CLINIC: [facility]   Patient's name: Brittney Pace Admit/DC Dates: 10/14/2023 - 10/17/2023  Discharge Diagnoses: LUE swelling/hematoma s/p AV graft placement. Seen by [facility] - not felt to be infected    Outpatient Dialysis Orders:  -Heparin : None -EDW No change   -Bath: No change   Anemia Aranesp : Given: -- Date of last dose/amount: --   PRBC's Given: -- Date/# of units: -- ESA dose for discharge: per oncology   Recent Labs  Lab 10/14/23 1858 10/15/23 0336 10/17/23 0350  HGB 8.8*   < > 7.7*  K 3.9   < > 4.1  CALCIUM  9.2   < > 8.8*  PHOS  --   --  4.8*  ALBUMIN 4.0  --   --    < > = values in this interval not displayed.    Access intervention/Change:  s/p AVG placement 10/11/23.    Medications: -IV Antibiotics: --   OTHER/APPTS/LABS    Completed by: Maisie Ronnald Acosta PA-C   D/C Meds to be reconciled by nurse after every discharge.    Reviewed by: MD:______ RN_______

## 2023-10-17 NOTE — TOC Transition Note (Signed)
 Transition of Care [facility]) - Discharge Note   Patient Details  Name: Brittney Pace MRN: [id number] Date of Birth: 08-09-1955  Transition of Care [facility]) CM/SW Contact:  Tom-Johnson, Shereen Marton Daphne, RN Phone Number: 10/17/2023, 1:44 PM   Clinical Narrative:     Patient is scheduled for discharge today after inpatient Hemodialysis.  Readmission Risk Assessment done. Outpatient f/u, hospital f/u and discharge instructions on AVS. Prescriptions sent to [facility] pharmacy and patient will receive meds prior discharge. No ICM needs or recommendations noted. Husband, Lynwood to transport at discharge.  No further ICM needs noted.        Patient Goals and CMS Choice            Discharge Placement                       Discharge Plan and Services Additional resources added to the After Visit Summary for                                       Social Drivers of Health (SDOH) Interventions SDOH Screenings   Food Insecurity: No Food Insecurity (10/15/2023)  Housing: Low Risk  (10/15/2023)  Transportation Needs: No Transportation Needs (10/15/2023)  Utilities: Not At Risk (10/15/2023)  Depression (PHQ2-9): Low Risk  (09/13/2023)  Social Connections: Socially Integrated (10/15/2023)  Tobacco Use: Medium Risk (10/11/2023)     Readmission Risk Interventions    10/17/2023    1:37 PM 03/29/2022    3:13 PM  Readmission Risk Prevention Plan  Transportation Screening Complete Complete  PCP or Specialist Appt within 3-5 Days Complete   HRI or Home Care Consult Complete   Social Work Consult for Recovery Care Planning/Counseling Complete   Palliative Care Screening Not Applicable   Medication Review Oceanographer) Referral to Pharmacy Complete  PCP or Specialist appointment within 3-5 days of discharge  Complete  SW Recovery Care/Counseling Consult  Complete  Palliative Care Screening  Not Applicable  Skilled Nursing Facility  Not Applicable

## 2023-10-17 NOTE — Procedures (Signed)
 Patient seen on Hemodialysis. BP 135/76   Pulse (!) 52   Temp 98.6 F (37 C) (Oral)   Resp 11   Ht 4' 10 (1.473 m)   Wt 45 kg   SpO2 (!) 89%   BMI 20.73 kg/m   QB 350, UF goal 1.5L Tolerating treatment without complaints at this time.   [name] MD [facility]. Office # [phone] Pager # [phone] 10:26 AM

## 2023-10-17 NOTE — Progress Notes (Signed)
 D/c orders noted. Contacted out-pt HD clinic, [facility], to inform of pt d/c and anticipated arrival Thursday. No further support needed at this time.   Albirta Rh[facility] [phone]

## 2023-10-18 ENCOUNTER — Telehealth: Payer: Self-pay | Admitting: Nephrology

## 2023-10-18 NOTE — Telephone Encounter (Signed)
 Transition of Care - Initial Contact after Hospitalization  Date of discharge:  10/19/23 Date of contact: 10/18/23  Method: Phone Spoke to: Patient  Patient contacted to discuss transition of care from recent inpatient hospitalization. Patient was admitted to [facility] from 9/20-9/23/25 with discharge diagnosis of LUE swelling s/p graft placement.   The discharge medication list was reviewed. Patient does have concerns about antibiotics, states she has developed several allergies to antibiotics. Will review with her PCP.   Patient will return to his/her outpatient HD unit on: Thursday 9/25  No other concerns at this time.

## 2023-10-19 LAB — CULTURE, BLOOD (ROUTINE X 2)
Culture: NO GROWTH
Culture: NO GROWTH
Special Requests: ADEQUATE

## 2023-10-23 ENCOUNTER — Encounter: Payer: Self-pay | Admitting: Hematology & Oncology

## 2023-10-23 ENCOUNTER — Inpatient Hospital Stay: Attending: Hematology & Oncology

## 2023-10-23 ENCOUNTER — Inpatient Hospital Stay

## 2023-10-23 ENCOUNTER — Inpatient Hospital Stay (HOSPITAL_BASED_OUTPATIENT_CLINIC_OR_DEPARTMENT_OTHER): Admitting: Hematology & Oncology

## 2023-10-23 ENCOUNTER — Other Ambulatory Visit: Payer: Self-pay

## 2023-10-23 VITALS — BP 146/44 | HR 66 | Temp 98.3°F | Resp 17 | Wt 112.0 lb

## 2023-10-23 DIAGNOSIS — D631 Anemia in chronic kidney disease: Secondary | ICD-10-CM

## 2023-10-23 DIAGNOSIS — D508 Other iron deficiency anemias: Secondary | ICD-10-CM | POA: Diagnosis not present

## 2023-10-23 DIAGNOSIS — I120 Hypertensive chronic kidney disease with stage 5 chronic kidney disease or end stage renal disease: Secondary | ICD-10-CM | POA: Diagnosis present

## 2023-10-23 DIAGNOSIS — Z8673 Personal history of transient ischemic attack (TIA), and cerebral infarction without residual deficits: Secondary | ICD-10-CM | POA: Diagnosis not present

## 2023-10-23 DIAGNOSIS — N186 End stage renal disease: Secondary | ICD-10-CM | POA: Diagnosis present

## 2023-10-23 DIAGNOSIS — Z79899 Other long term (current) drug therapy: Secondary | ICD-10-CM | POA: Insufficient documentation

## 2023-10-23 DIAGNOSIS — D638 Anemia in other chronic diseases classified elsewhere: Secondary | ICD-10-CM

## 2023-10-23 DIAGNOSIS — E1059 Type 1 diabetes mellitus with other circulatory complications: Secondary | ICD-10-CM

## 2023-10-23 DIAGNOSIS — E1022 Type 1 diabetes mellitus with diabetic chronic kidney disease: Secondary | ICD-10-CM

## 2023-10-23 DIAGNOSIS — N1831 Chronic kidney disease, stage 3a: Secondary | ICD-10-CM

## 2023-10-23 LAB — RETICULOCYTES
Immature Retic Fract: 6.4 % (ref 2.3–15.9)
RBC.: 2.65 MIL/uL — ABNORMAL LOW (ref 3.87–5.11)
Retic Count, Absolute: 28.4 K/uL (ref 19.0–186.0)
Retic Ct Pct: 1.1 % (ref 0.4–3.1)

## 2023-10-23 LAB — IRON AND IRON BINDING CAPACITY (CC-WL,HP ONLY)
Iron: 62 ug/dL (ref 28–170)
Saturation Ratios: 22 % (ref 10.4–31.8)
TIBC: 287 ug/dL (ref 250–450)
UIBC: 225 ug/dL

## 2023-10-23 LAB — CMP (CANCER CENTER ONLY)
ALT: 14 U/L (ref 0–44)
AST: 26 U/L (ref 15–41)
Albumin: 4.1 g/dL (ref 3.5–5.0)
Alkaline Phosphatase: 137 U/L — ABNORMAL HIGH (ref 38–126)
Anion gap: 14 (ref 5–15)
BUN: 39 mg/dL — ABNORMAL HIGH (ref 8–23)
CO2: 24 mmol/L (ref 22–32)
Calcium: 9.4 mg/dL (ref 8.9–10.3)
Chloride: 97 mmol/L — ABNORMAL LOW (ref 98–111)
Creatinine: 2.74 mg/dL — ABNORMAL HIGH (ref 0.44–1.00)
GFR, Estimated: 18 mL/min — ABNORMAL LOW (ref >60)
Glucose, Bld: 236 mg/dL — ABNORMAL HIGH (ref 70–99)
Potassium: 4.6 mmol/L (ref 3.5–5.1)
Sodium: 135 mmol/L (ref 135–145)
Total Bilirubin: 0.3 mg/dL (ref 0.0–1.2)
Total Protein: 6.4 g/dL — ABNORMAL LOW (ref 6.5–8.1)

## 2023-10-23 LAB — CBC WITH DIFFERENTIAL (CANCER CENTER ONLY)
Abs Immature Granulocytes: 0.07 K/uL (ref 0.00–0.07)
Basophils Absolute: 0.1 K/uL (ref 0.0–0.1)
Basophils Relative: 1 %
Eosinophils Absolute: 0.7 K/uL — ABNORMAL HIGH (ref 0.0–0.5)
Eosinophils Relative: 10 %
HCT: 23.9 % — ABNORMAL LOW (ref 36.0–46.0)
Hemoglobin: 7.9 g/dL — ABNORMAL LOW (ref 12.0–15.0)
Immature Granulocytes: 1 %
Lymphocytes Relative: 21 %
Lymphs Abs: 1.5 K/uL (ref 0.7–4.0)
MCH: 29.6 pg (ref 26.0–34.0)
MCHC: 33.1 g/dL (ref 30.0–36.0)
MCV: 89.5 fL (ref 80.0–100.0)
Monocytes Absolute: 0.9 K/uL (ref 0.1–1.0)
Monocytes Relative: 13 %
Neutro Abs: 3.9 K/uL (ref 1.7–7.7)
Neutrophils Relative %: 54 %
Platelet Count: 352 K/uL (ref 150–400)
RBC: 2.67 MIL/uL — ABNORMAL LOW (ref 3.87–5.11)
RDW: 13.3 % (ref 11.5–15.5)
WBC Count: 7.1 K/uL (ref 4.0–10.5)
nRBC: 0 % (ref 0.0–0.2)

## 2023-10-23 LAB — FERRITIN: Ferritin: 440 ng/mL — ABNORMAL HIGH (ref 11–307)

## 2023-10-23 MED ORDER — DARBEPOETIN ALFA 300 MCG/0.6ML IJ SOSY
300.0000 ug | PREFILLED_SYRINGE | Freq: Once | INTRAMUSCULAR | Status: AC
  Administered 2023-10-23: 300 ug via SUBCUTANEOUS
  Filled 2023-10-23: qty 0.6

## 2023-10-23 NOTE — Progress Notes (Signed)
 Hematology and Oncology Follow Up Visit  Brittney Pace [id number] [birth year] 68 y.o. 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  Unfortunately, she was recently in the hospital.  I think she had cellulitis.  She was treated for this.  I think she was put on some oral antibiotics.  Her AV fistula in the left arm still is not doing all that well and her pain.  She is getting hemodialysis.  She would not to happy about the hemodialysis.  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  She is not sure when this will happen.  Her blood sugars are still on the high side.  Today, her blood sugar was 236.  She has had no issues with nausea or vomiting.  She does have some fatigue.  She is worried about weight gain.  She has had no issues with bleeding.  She is still making urine.  When we last saw her, her ferritin was 403 with an iron saturation of 25%.  Currently, I would have to say that her performance status is probably ECOG 2.   Wt Readings from Last 3 Encounters:  10/17/23 99 lb 3.3 oz (45 kg)  10/11/23 110 lb (49.9 kg)  10/03/23 110 lb 12.8 oz (50.3 kg)    Medications:  Allergies as of 10/23/2023       Reactions   Morphine  Anaphylaxis, Shortness Of Breath   Penicillins Hives   Tolerated ANCEF  on 04/30/20 Has patient had a PCN reaction causing immediate rash, facial/tongue/throat swelling, SOB or lightheadedness with hypotension: Yes Has patient had a PCN reaction causing severe rash involving mucus membranes or skin necrosis: No Has patient had a PCN reaction that required hospitalization No Has patient had a PCN reaction occurring within the last 10 years: No If all of the above answers are NO, then may proceed with Cephalosporin use.   Tramadol  Anaphylaxis   Acetaminophen  Other (See Comments)   Alters insulin  pump readings   Amitriptyline Other (See Comments)   Severe headache/ out of body feeling   Codeine Other (See Comments)   Severe headaches/ out of body feeling   Ibuprofen Other (See Comments)   Messes up CGM reading on glucose monitor   Krill Oil Diarrhea, Itching, Nausea And Vomiting   Lactose Intolerance (gi) Diarrhea   Losartan Cough   Propoxyphene Other (See Comments)   Severe headaches / out of body feeling   Shellfish Allergy Diarrhea, Nausea And Vomiting   Statins Other (See Comments)   Muscle pains Other reaction(s): Other   Sulfamethoxazole Other (See Comments)   Mouth ulcers   Sulfites Itching, Other (See Comments)   Mouth ulcers   Ace Inhibitors Other (See Comments)   Other Reaction(s): Unknown   Fluconazole    Other reaction(s): Contact Dermatitis (intolerance)   Norvasc  [amlodipine  Besylate] Swelling        Medication List        Accurate as of October 23, 2023  1:59 PM. If you have any questions, ask your nurse or doctor.          Accu-Chek Guide test strip Generic drug: glucose blood 3 (three) times daily.   ARANESP  IJ Inject as directed.   Armour Thyroid  60 MG tablet Generic drug:  thyroid  Take 60 mg by mouth daily before breakfast.   aspirin  EC 325 MG tablet Take 1 tablet (325 mg total) by mouth daily.   b complex-vitamin c -folic acid  0.8 MG Tabs tablet Take 1 tablet by mouth See admin instructions. Patient states that she takes this medication a couple times a week   carvedilol  12.5 MG tablet Commonly known as: COREG  TAKE 1 TABLET BY MOUTH TWICE DAILY WITH A MEAL What changed:  when to take this additional instructions   cefdinir  300 MG capsule Commonly known as: OMNICEF  Take 1 capsule (300 mg total) by mouth every other day for 3 doses.   cetirizine 10 MG tablet Commonly known as: ZYRTEC Take 10 mg by mouth at bedtime.   clopidogrel  75 MG tablet Commonly  known as: Plavix  Take 1 tablet (75 mg total) by mouth daily.   dexamethasone  0.1 % ophthalmic solution Commonly known as: DECADRON  Place 1 drop into the left eye daily as needed (Left ear daily.).   Dexcom G7 Sensor Misc Inject 1 each into the skin every 3 (three) days.   diclofenac  Sodium 1 % Gel Commonly known as: VOLTAREN  Apply 1 Application topically daily as needed (pain).   diphenoxylate -atropine  2.5-0.025 MG tablet Commonly known as: LOMOTIL  TAKE 1 TABLET BY MOUTH 4 TIMES DAILY AS NEEDED FOR DIARRHEA OR  LOOSE  STOOLS   doxycycline  100 MG tablet Commonly known as: VIBRA -TABS Take 1 tablet (100 mg total) by mouth 2 (two) times daily for 5 days.   esomeprazole 20 MG capsule Commonly known as: NEXIUM Take 20 mg by mouth every morning.   fluocinonide  0.05 % external solution Commonly known as: LIDEX  Apply 1 Application topically daily.   hydrOXYzine  10 MG tablet Commonly known as: ATARAX  TAKE 1 TABLET BY MOUTH EVERY 6 HOURS AS NEEDED What changed: reasons to take this   ketoconazole  2 % shampoo Commonly known as: NIZORAL  Apply 1 Application topically 2 (two) times a week.   ketorolac  0.5 % ophthalmic solution Commonly known as: ACULAR  Place 1 drop into both eyes 2 (two) times daily.   Melatonin Gummies 2.5 MG Chew Chew 2.5 mg by mouth at bedtime as needed (sleep).   nitroGLYCERIN  0.4 MG/SPRAY spray Commonly known as: NITROLINGUAL  Place 1 spray under the tongue every 5 (five) minutes x 3 doses as needed.   NovoLOG  100 UNIT/ML injection Generic drug: insulin  aspart Inject 25 Units into the skin continuous. Insulin  pump   OVER THE COUNTER MEDICATION Take 1 capsule by mouth at bedtime. Conjugated linoleic acid   OVER THE COUNTER MEDICATION Take 4 tablets by mouth at bedtime. Magnesium  Threonate   oxyCODONE  5 MG immediate release tablet Commonly known as: Roxicodone  Take 1 tablet (5 mg total) by mouth every 8 (eight) hours as needed.   prednisoLONE   acetate 1 % ophthalmic suspension Commonly known as: PRED FORTE  Place 1 drop into both eyes 2 (two) times daily.   Premarin  vaginal cream Generic drug: conjugated estrogens  Place 1 applicator vaginally daily as needed (irritation).   torsemide  20 MG tablet Commonly known as: [name]  Take 2 tablets (40 mg total) by mouth daily. What changed:  when to take this reasons to take this   valACYclovir  500 MG tablet Commonly known as: VALTREX  Take 500 mg by mouth daily as needed (Flair up).        Allergies:  Allergies  Allergen Reactions   Morphine  Anaphylaxis and Shortness Of Breath   Penicillins Hives    Tolerated ANCEF  on 04/30/20 Has patient had a  PCN reaction causing immediate rash, facial/tongue/throat swelling, SOB or lightheadedness with hypotension: Yes Has patient had a PCN reaction causing severe rash involving mucus membranes or skin necrosis: No Has patient had a PCN reaction that required hospitalization No Has patient had a PCN reaction occurring within the last 10 years: No If all of the above answers are NO, then may proceed with Cephalosporin use.   Tramadol Anaphylaxis   Acetaminophen  Other (See Comments)    Alters insulin  pump readings    Amitriptyline Other (See Comments)    Severe headache/ out of body feeling   Codeine Other (See Comments)    Severe headaches/ out of body feeling    Ibuprofen Other (See Comments)    Messes up CGM reading on glucose monitor     Krill Oil Diarrhea, Itching and Nausea And Vomiting   Lactose Intolerance (Gi) Diarrhea   Losartan Cough   Propoxyphene Other (See Comments)    Severe headaches / out of body feeling    Shellfish Allergy Diarrhea and Nausea And Vomiting   Statins Other (See Comments)    Muscle pains Other reaction(s): Other   Sulfamethoxazole Other (See Comments)    Mouth ulcers    Sulfites Itching and Other (See Comments)    Mouth ulcers   Ace Inhibitors Other (See Comments)    Other Reaction(s):  Unknown   Fluconazole     Other reaction(s): Contact Dermatitis (intolerance)   Norvasc  [Amlodipine  Besylate] Swelling    Past Medical History, Surgical history, Social history, and Family History were reviewed and updated.  Review of Systems: Review of Systems  Constitutional:  Positive for malaise/fatigue. Negative for chills and fever.  HENT: Negative.    Eyes:  Positive for blurred vision.  Respiratory:  Positive for shortness of breath (mild).   Cardiovascular:  Negative for leg swelling.  Gastrointestinal:  Negative for abdominal pain and diarrhea.  Genitourinary:  Negative for dysuria, flank pain, frequency, hematuria and urgency.  Musculoskeletal:  Positive for joint pain and myalgias.  Skin:  Negative for rash.  Neurological:  Positive for headaches.  Endo/Heme/Allergies: Negative.   Psychiatric/Behavioral: Negative.     Wt Readings from Last 3 Encounters:  10/17/23 99 lb 3.3 oz (45 kg)  10/11/23 110 lb (49.9 kg)  10/03/23 110 lb 12.8 oz (50.3 kg)   Vital signs show a temperature 98.3.  Pulse 66.  Blood pressure 146/44.  Weight is 112 pounds.    Physical Exam Vitals reviewed.  Constitutional:      Comments: Gentle use of cane for ambulation   HENT:     Head: Normocephalic and atraumatic.  Eyes:     Pupils: Pupils are equal, round, and reactive to light.  Cardiovascular:     Rate and Rhythm: Normal rate and regular rhythm.     Heart sounds: Normal heart sounds.  Pulmonary:     Effort: Pulmonary effort is normal.     Breath sounds: Normal breath sounds.  Musculoskeletal:     Cervical back: Normal range of motion.     Right lower leg: No edema.     Left lower leg: No edema.  Lymphadenopathy:     Cervical: No cervical adenopathy.  Skin:    General: Skin is warm and dry.     Findings: No erythema or rash.  Neurological:     Mental Status: She is alert and oriented to person, place, and time. Mental status is at baseline.     Comments: Cranial nerves are  intact  Ambulating  slowly with cane  Psychiatric:        Behavior: Behavior normal.        Thought Content: Thought content normal.        Judgment: Judgment normal.     Lab Results  Component Value Date   WBC 7.1 10/23/2023   HGB 7.9 (L) 10/23/2023   HCT 23.9 (L) 10/23/2023   MCV 89.5 10/23/2023   PLT 352 10/23/2023   Lab Results  Component Value Date   FERRITIN 403 (H) 09/13/2023   IRON 75 09/13/2023   TIBC 301 09/13/2023   UIBC 226 09/13/2023   IRONPCTSAT 25 09/13/2023   Lab Results  Component Value Date   RETICCTPCT 1.1 10/23/2023   RBC 2.67 (L) 10/23/2023   RBC 2.65 (L) 10/23/2023   No results found for: KPAFRELGTCHN, LAMBDASER, KAPLAMBRATIO No results found for: IGGSERUM, IGA, IGMSERUM No results found for: STEPHANY CARLOTA BENSON MARKEL EARLA JOANNIE DOC, MSPIKE, SPEI   Chemistry      Component Value Date/Time   NA 130 (L) 10/17/2023 0350   NA 138 07/17/2017 1128   K 4.1 10/17/2023 0350   CL 96 (L) 10/17/2023 0350   CO2 25 10/17/2023 0350   BUN 41 (H) 10/17/2023 0350   BUN 23 07/17/2017 1128   CREATININE 3.72 (H) 10/17/2023 0350   CREATININE 3.04 (H) 09/13/2023 1343   CREATININE 2.45 (H) 03/03/2022 1129      Component Value Date/Time   CALCIUM  8.8 (L) 10/17/2023 0350   CALCIUM  8.7 02/06/2023 0000   ALKPHOS 120 10/14/2023 1858   AST 36 10/14/2023 1858   AST 28 09/13/2023 1343   ALT 14 10/14/2023 1858   ALT 15 09/13/2023 1343   BILITOT 0.4 10/14/2023 1858   BILITOT 0.3 09/13/2023 1343      Impression and Plan: Brittney Pace is a very pleasant 68 yo female with multifactorial anemia. She has CKD, HTN, DM1.   I really hate that she had at this cellulitis.  I know this was quit painful for.  She will go ahead and get Aranesp  today.  I know this will help her out.  We will have to see what her iron studies look like.  We will plan to get her back in another month.  We really had to follow her closely.     Maude JONELLE Crease, MD 9/29/20251:59 PM

## 2023-10-23 NOTE — Patient Instructions (Signed)

## 2023-11-14 ENCOUNTER — Encounter

## 2023-11-14 ENCOUNTER — Encounter (HOSPITAL_COMMUNITY)

## 2023-11-20 ENCOUNTER — Ambulatory Visit

## 2023-11-20 ENCOUNTER — Ambulatory Visit (HOSPITAL_COMMUNITY): Attending: Vascular Surgery

## 2023-11-27 ENCOUNTER — Inpatient Hospital Stay: Attending: Hematology & Oncology

## 2023-11-27 ENCOUNTER — Inpatient Hospital Stay

## 2023-11-27 ENCOUNTER — Other Ambulatory Visit: Payer: Self-pay

## 2023-11-27 ENCOUNTER — Encounter: Payer: Self-pay | Admitting: Hematology & Oncology

## 2023-11-27 ENCOUNTER — Ambulatory Visit: Payer: Self-pay | Admitting: Hematology & Oncology

## 2023-11-27 ENCOUNTER — Inpatient Hospital Stay (HOSPITAL_BASED_OUTPATIENT_CLINIC_OR_DEPARTMENT_OTHER): Admitting: Hematology & Oncology

## 2023-11-27 VITALS — BP 161/66 | HR 74 | Temp 98.3°F | Resp 18 | Ht <= 58 in | Wt 107.0 lb

## 2023-11-27 DIAGNOSIS — N186 End stage renal disease: Secondary | ICD-10-CM | POA: Insufficient documentation

## 2023-11-27 DIAGNOSIS — D508 Other iron deficiency anemias: Secondary | ICD-10-CM | POA: Diagnosis not present

## 2023-11-27 DIAGNOSIS — Z794 Long term (current) use of insulin: Secondary | ICD-10-CM | POA: Diagnosis not present

## 2023-11-27 DIAGNOSIS — D631 Anemia in chronic kidney disease: Secondary | ICD-10-CM | POA: Diagnosis not present

## 2023-11-27 DIAGNOSIS — D638 Anemia in other chronic diseases classified elsewhere: Secondary | ICD-10-CM

## 2023-11-27 DIAGNOSIS — Z992 Dependence on renal dialysis: Secondary | ICD-10-CM | POA: Insufficient documentation

## 2023-11-27 DIAGNOSIS — N183 Chronic kidney disease, stage 3 unspecified: Secondary | ICD-10-CM

## 2023-11-27 DIAGNOSIS — N1831 Chronic kidney disease, stage 3a: Secondary | ICD-10-CM

## 2023-11-27 DIAGNOSIS — E1022 Type 1 diabetes mellitus with diabetic chronic kidney disease: Secondary | ICD-10-CM | POA: Insufficient documentation

## 2023-11-27 DIAGNOSIS — I120 Hypertensive chronic kidney disease with stage 5 chronic kidney disease or end stage renal disease: Secondary | ICD-10-CM | POA: Diagnosis present

## 2023-11-27 DIAGNOSIS — Z79899 Other long term (current) drug therapy: Secondary | ICD-10-CM | POA: Diagnosis not present

## 2023-11-27 LAB — CBC WITH DIFFERENTIAL (CANCER CENTER ONLY)
Abs Immature Granulocytes: 0.05 K/uL (ref 0.00–0.07)
Basophils Absolute: 0.1 K/uL (ref 0.0–0.1)
Basophils Relative: 1 %
Eosinophils Absolute: 0.6 K/uL — ABNORMAL HIGH (ref 0.0–0.5)
Eosinophils Relative: 8 %
HCT: 31.3 % — ABNORMAL LOW (ref 36.0–46.0)
Hemoglobin: 10.1 g/dL — ABNORMAL LOW (ref 12.0–15.0)
Immature Granulocytes: 1 %
Lymphocytes Relative: 18 %
Lymphs Abs: 1.2 K/uL (ref 0.7–4.0)
MCH: 28.7 pg (ref 26.0–34.0)
MCHC: 32.3 g/dL (ref 30.0–36.0)
MCV: 88.9 fL (ref 80.0–100.0)
Monocytes Absolute: 0.7 K/uL (ref 0.1–1.0)
Monocytes Relative: 11 %
Neutro Abs: 4.2 K/uL (ref 1.7–7.7)
Neutrophils Relative %: 61 %
Platelet Count: 338 K/uL (ref 150–400)
RBC: 3.52 MIL/uL — ABNORMAL LOW (ref 3.87–5.11)
RDW: 13.7 % (ref 11.5–15.5)
WBC Count: 6.8 K/uL (ref 4.0–10.5)
nRBC: 0 % (ref 0.0–0.2)

## 2023-11-27 LAB — CMP (CANCER CENTER ONLY)
ALT: 12 U/L (ref 0–44)
AST: 26 U/L (ref 15–41)
Albumin: 4.5 g/dL (ref 3.5–5.0)
Alkaline Phosphatase: 127 U/L — ABNORMAL HIGH (ref 38–126)
Anion gap: 15 (ref 5–15)
BUN: 38 mg/dL — ABNORMAL HIGH (ref 8–23)
CO2: 25 mmol/L (ref 22–32)
Calcium: 9.7 mg/dL (ref 8.9–10.3)
Chloride: 92 mmol/L — ABNORMAL LOW (ref 98–111)
Creatinine: 2.8 mg/dL — ABNORMAL HIGH (ref 0.44–1.00)
GFR, Estimated: 18 mL/min — ABNORMAL LOW (ref >60)
Glucose, Bld: 262 mg/dL — ABNORMAL HIGH (ref 70–99)
Potassium: 4.6 mmol/L (ref 3.5–5.1)
Sodium: 132 mmol/L — ABNORMAL LOW (ref 135–145)
Total Bilirubin: 0.4 mg/dL (ref 0.0–1.2)
Total Protein: 6.8 g/dL (ref 6.5–8.1)

## 2023-11-27 LAB — IRON AND IRON BINDING CAPACITY (CC-WL,HP ONLY)
Iron: 61 ug/dL (ref 28–170)
Saturation Ratios: 19 % (ref 10.4–31.8)
TIBC: 322 ug/dL (ref 250–450)
UIBC: 261 ug/dL

## 2023-11-27 LAB — RETICULOCYTES
Immature Retic Fract: 4 % (ref 2.3–15.9)
RBC.: 3.49 MIL/uL — ABNORMAL LOW (ref 3.87–5.11)
Retic Count, Absolute: 21.6 K/uL (ref 19.0–186.0)
Retic Ct Pct: 0.6 % (ref 0.4–3.1)

## 2023-11-27 LAB — FERRITIN: Ferritin: 227 ng/mL (ref 11–307)

## 2023-11-27 MED ORDER — DARBEPOETIN ALFA 300 MCG/0.6ML IJ SOSY
300.0000 ug | PREFILLED_SYRINGE | Freq: Once | INTRAMUSCULAR | Status: AC
  Administered 2023-11-27: 300 ug via SUBCUTANEOUS
  Filled 2023-11-27: qty 0.6

## 2023-11-27 NOTE — Patient Instructions (Signed)

## 2023-11-27 NOTE — Progress Notes (Signed)
 Hematology and Oncology Follow Up Visit  Brittney Pace [id number] 13-Sep-1955 68 y.o. 11/27/2023   Principle Diagnosis:  Anemia of erythropoietin  deficiency - chronic kidney disease Insulin -dependent diabetes History of TIAs   Current Therapy:        Aranesp  300 mcg SQ for Hgb < 11    Interim History:  Brittney Pace is here today for follow-up.  She still does not feel all that great.  The fistula in the left arm is still not usable.  I think she goes back to have this looked at in December.  She still getting dialysis through her external catheter.  Otherwise, she really has had no specific complaints.  Her blood sugars, as always, are incredibly high.  I am not sure these will ever be controlled.  She is still awaiting the VP shunt to be placed.  Again this cannot be done until the dialysis catheter is removed.  She has had no fever.  She has had no bleeding.  There has been no diarrhea.  She is still making little bit of urine.  She has had no leg pain.  When we last saw her, her ferritin was 440 with an iron saturation of 22%.  She has had a decent appetite.  There is been no nausea or vomiting.  Overall, I will say that her performance status is probably ECOG 2.   Wt Readings from Last 3 Encounters:  11/27/23 107 lb (48.5 kg)  10/23/23 112 lb (50.8 kg)  10/17/23 99 lb 3.3 oz (45 kg)    Medications:  Allergies as of 11/27/2023       Reactions   Morphine  Anaphylaxis, Shortness Of Breath   Penicillins Hives   Tolerated ANCEF  on 04/30/20 Has patient had a PCN reaction causing immediate rash, facial/tongue/throat swelling, SOB or lightheadedness with hypotension: Yes Has patient had a PCN reaction causing severe rash involving mucus membranes or skin necrosis: No Has patient had a PCN reaction that required hospitalization No Has patient had a PCN reaction occurring within the last 10 years: No If all of the above answers are NO, then may proceed with Cephalosporin  use.   Tramadol Anaphylaxis   Acetaminophen  Other (See Comments)   Alters insulin  pump readings   Amitriptyline Other (See Comments)   Severe headache/ out of body feeling   Codeine Other (See Comments)   Severe headaches/ out of body feeling   Ibuprofen Other (See Comments)   Messes up CGM reading on glucose monitor   Krill Oil Diarrhea, Itching, Nausea And Vomiting   Lactose Intolerance (gi) Diarrhea   Losartan Cough   Propoxyphene Other (See Comments)   Severe headaches / out of body feeling   Shellfish Allergy Diarrhea, Nausea And Vomiting   Statins Other (See Comments)   Muscle pains Other reaction(s): Other   Sulfamethoxazole Other (See Comments)   Mouth ulcers   Sulfites Itching, Other (See Comments)   Mouth ulcers   Ace Inhibitors Other (See Comments)   Other Reaction(s): Unknown   Fluconazole    Other reaction(s): Contact Dermatitis (intolerance)   Norvasc  [amlodipine  Besylate] Swelling        Medication List        Accurate as of November 27, 2023 10:37 AM. If you have any questions, ask your nurse or doctor.          Accu-Chek Guide test strip Generic drug: glucose blood 3 (three) times daily.   ARANESP  IJ Inject as directed.   Armour Thyroid  60 MG tablet  Generic drug: thyroid  Take 60 mg by mouth daily before breakfast.   aspirin  EC 325 MG tablet Take 1 tablet (325 mg total) by mouth daily.   b complex-vitamin c -folic acid  0.8 MG Tabs tablet Take 1 tablet by mouth See admin instructions. Patient states that she takes this medication a couple times a week   carvedilol  12.5 MG tablet Commonly known as: COREG  TAKE 1 TABLET BY MOUTH TWICE DAILY WITH A MEAL What changed:  when to take this additional instructions   cetirizine 10 MG tablet Commonly known as: ZYRTEC Take 10 mg by mouth at bedtime.   clopidogrel  75 MG tablet Commonly known as: Plavix  Take 1 tablet (75 mg total) by mouth daily.   dexamethasone  0.1 % ophthalmic  solution Commonly known as: DECADRON  Place 1 drop into the left eye daily as needed (Left ear daily.).   Dexcom G7 Sensor Misc Inject 1 each into the skin every 3 (three) days.   diclofenac  Sodium 1 % Gel Commonly known as: VOLTAREN  Apply 1 Application topically daily as needed (pain).   diphenoxylate -atropine  2.5-0.025 MG tablet Commonly known as: LOMOTIL  TAKE 1 TABLET BY MOUTH 4 TIMES DAILY AS NEEDED FOR DIARRHEA OR  LOOSE  STOOLS   esomeprazole 20 MG capsule Commonly known as: NEXIUM Take 20 mg by mouth every morning.   fluocinonide  0.05 % external solution Commonly known as: LIDEX  Apply 1 Application topically daily.   hydrOXYzine  10 MG tablet Commonly known as: ATARAX  TAKE 1 TABLET BY MOUTH EVERY 6 HOURS AS NEEDED What changed: reasons to take this   ketoconazole  2 % shampoo Commonly known as: NIZORAL  Apply 1 Application topically 2 (two) times a week.   ketorolac  0.5 % ophthalmic solution Commonly known as: ACULAR  Place 1 drop into both eyes 2 (two) times daily.   Melatonin Gummies 2.5 MG Chew Chew 2.5 mg by mouth at bedtime as needed (sleep).   nitroGLYCERIN  0.4 MG/SPRAY spray Commonly known as: NITROLINGUAL  Place 1 spray under the tongue every 5 (five) minutes x 3 doses as needed.   NovoLOG  100 UNIT/ML injection Generic drug: insulin  aspart Inject 25 Units into the skin continuous. Insulin  pump   OVER THE COUNTER MEDICATION Take 1 capsule by mouth at bedtime. Conjugated linoleic acid   OVER THE COUNTER MEDICATION Take 4 tablets by mouth at bedtime. Magnesium  Threonate   oxyCODONE  5 MG immediate release tablet Commonly known as: Roxicodone  Take 1 tablet (5 mg total) by mouth every 8 (eight) hours as needed.   prednisoLONE  acetate 1 % ophthalmic suspension Commonly known as: PRED FORTE  Place 1 drop into both eyes 2 (two) times daily.   Premarin  vaginal cream Generic drug: conjugated estrogens  Place 1 applicator vaginally daily as needed  (irritation).   torsemide  20 MG tablet Commonly known as: [name]  Take 2 tablets (40 mg total) by mouth daily. What changed:  when to take this reasons to take this   valACYclovir  500 MG tablet Commonly known as: VALTREX  Take 500 mg by mouth daily as needed (Flair up).        Allergies:  Allergies  Allergen Reactions   Morphine  Anaphylaxis and Shortness Of Breath   Penicillins Hives    Tolerated ANCEF  on 04/30/20 Has patient had a PCN reaction causing immediate rash, facial/tongue/throat swelling, SOB or lightheadedness with hypotension: Yes Has patient had a PCN reaction causing severe rash involving mucus membranes or skin necrosis: No Has patient had a PCN reaction that required hospitalization No Has patient had a PCN reaction occurring within  the last 10 years: No If all of the above answers are NO, then may proceed with Cephalosporin use.   Tramadol Anaphylaxis   Acetaminophen  Other (See Comments)    Alters insulin  pump readings    Amitriptyline Other (See Comments)    Severe headache/ out of body feeling   Codeine Other (See Comments)    Severe headaches/ out of body feeling    Ibuprofen Other (See Comments)    Messes up CGM reading on glucose monitor     Krill Oil Diarrhea, Itching and Nausea And Vomiting   Lactose Intolerance (Gi) Diarrhea   Losartan Cough   Propoxyphene Other (See Comments)    Severe headaches / out of body feeling    Shellfish Allergy Diarrhea and Nausea And Vomiting   Statins Other (See Comments)    Muscle pains Other reaction(s): Other   Sulfamethoxazole Other (See Comments)    Mouth ulcers    Sulfites Itching and Other (See Comments)    Mouth ulcers   Ace Inhibitors Other (See Comments)    Other Reaction(s): Unknown   Fluconazole     Other reaction(s): Contact Dermatitis (intolerance)   Norvasc  [Amlodipine  Besylate] Swelling    Past Medical History, Surgical history, Social history, and Family History were reviewed and  updated.  Review of Systems: Review of Systems  Constitutional:  Positive for malaise/fatigue. Negative for chills and fever.  HENT: Negative.    Eyes:  Positive for blurred vision.  Respiratory:  Positive for shortness of breath (mild).   Cardiovascular:  Negative for leg swelling.  Gastrointestinal:  Negative for abdominal pain and diarrhea.  Genitourinary:  Negative for dysuria, flank pain, frequency, hematuria and urgency.  Musculoskeletal:  Positive for joint pain and myalgias.  Skin:  Negative for rash.  Neurological:  Positive for headaches.  Endo/Heme/Allergies: Negative.   Psychiatric/Behavioral: Negative.     Wt Readings from Last 3 Encounters:  11/27/23 107 lb (48.5 kg)  10/23/23 112 lb (50.8 kg)  10/17/23 99 lb 3.3 oz (45 kg)   Vital signs show a temperature 98.3.  Pulse 74.  Blood pressure 161/66.  Weight is 107 pounds.    Physical Exam Vitals reviewed.  Constitutional:      Comments: Gentle use of cane for ambulation   HENT:     Head: Normocephalic and atraumatic.  Eyes:     Pupils: Pupils are equal, round, and reactive to light.  Cardiovascular:     Rate and Rhythm: Normal rate and regular rhythm.     Heart sounds: Normal heart sounds.  Pulmonary:     Effort: Pulmonary effort is normal.     Breath sounds: Normal breath sounds.  Musculoskeletal:     Cervical back: Normal range of motion.     Right lower leg: No edema.     Left lower leg: No edema.  Lymphadenopathy:     Cervical: No cervical adenopathy.  Skin:    General: Skin is warm and dry.     Findings: No erythema or rash.  Neurological:     Mental Status: She is alert and oriented to person, place, and time. Mental status is at baseline.     Comments: Cranial nerves are intact  Ambulating slowly with cane  Psychiatric:        Behavior: Behavior normal.        Thought Content: Thought content normal.        Judgment: Judgment normal.     Lab Results  Component Value Date   WBC  6.8  11/27/2023   HGB 10.1 (L) 11/27/2023   HCT 31.3 (L) 11/27/2023   MCV 88.9 11/27/2023   PLT 338 11/27/2023   Lab Results  Component Value Date   FERRITIN 440 (H) 10/23/2023   IRON 62 10/23/2023   TIBC 287 10/23/2023   UIBC 225 10/23/2023   IRONPCTSAT 22 10/23/2023   Lab Results  Component Value Date   RETICCTPCT 0.6 11/27/2023   RBC 3.52 (L) 11/27/2023   RBC 3.49 (L) 11/27/2023   No results found for: KPAFRELGTCHN, LAMBDASER, KAPLAMBRATIO No results found for: IGGSERUM, IGA, IGMSERUM No results found for: STEPHANY CARLOTA BENSON MARKEL EARLA JOANNIE DOC, MSPIKE, SPEI   Chemistry      Component Value Date/Time   NA 132 (L) 11/27/2023 0949   NA 138 07/17/2017 1128   K 4.6 11/27/2023 0949   CL 92 (L) 11/27/2023 0949   CO2 25 11/27/2023 0949   BUN 38 (H) 11/27/2023 0949   BUN 23 07/17/2017 1128   CREATININE 2.80 (H) 11/27/2023 0949   CREATININE 2.45 (H) 03/03/2022 1129      Component Value Date/Time   CALCIUM  9.7 11/27/2023 0949   CALCIUM  8.7 02/06/2023 0000   ALKPHOS 127 (H) 11/27/2023 0949   AST 26 11/27/2023 0949   ALT 12 11/27/2023 0949   BILITOT 0.4 11/27/2023 0949      Impression and Plan: Ms. Touch is a very pleasant 68 yo female with multifactorial anemia. She has CKD, HTN, DM1.   Overall, she is managing.  I hate that the dialysis catheter has not yet come out.  I would like to think that this would make life a lot easier.  Then, she can get the VP shunt.  We will going give her Aranesp  today.  We will see what her iron studies look like.  I will plan to get her back to see me in another month or so.   Maude JONELLE Crease, MD 11/3/202510:37 AM

## 2023-11-30 ENCOUNTER — Telehealth: Payer: Self-pay | Admitting: Hematology & Oncology

## 2023-11-30 ENCOUNTER — Telehealth: Payer: Self-pay

## 2023-11-30 NOTE — Telephone Encounter (Signed)
 Spoke with Dr Sherrill from [facility] who was inquiring about patient receiving her Aranesp  injections at their clinic when she comes in for dialysis. Spoke Dr Timmy and he stated that would be fine as long as the pt is ok with it. Verified with Dr Sherrill that pt receives Aransep 300 mcg monthly for Hgb <11.

## 2023-11-30 NOTE — Telephone Encounter (Signed)
 Called to scheudle infusion per inbasket. LVM to return call for scheduling.

## 2023-12-05 ENCOUNTER — Telehealth: Payer: Self-pay | Admitting: Hematology & Oncology

## 2023-12-05 NOTE — Telephone Encounter (Signed)
 Lvm for patient to return call for scheudling 1 dose of Ferrlecit per inbasket.

## 2023-12-07 ENCOUNTER — Telehealth: Payer: Self-pay | Admitting: Hematology & Oncology

## 2023-12-07 NOTE — Telephone Encounter (Signed)
 Per inbasket pt needs a dose of Ferrlecit but she doesn't want to have [place] inf bc [place] and it made her iron levels extremely high. Declining at this time. Clinical team was notified.

## 2023-12-20 ENCOUNTER — Other Ambulatory Visit (HOSPITAL_COMMUNITY): Payer: Self-pay

## 2023-12-28 ENCOUNTER — Other Ambulatory Visit (HOSPITAL_COMMUNITY): Payer: Self-pay | Admitting: Nephrology

## 2023-12-28 DIAGNOSIS — N186 End stage renal disease: Secondary | ICD-10-CM

## 2024-01-01 ENCOUNTER — Inpatient Hospital Stay: Admitting: Hematology & Oncology

## 2024-01-01 ENCOUNTER — Inpatient Hospital Stay

## 2024-01-02 ENCOUNTER — Ambulatory Visit (HOSPITAL_COMMUNITY)
Admission: RE | Admit: 2024-01-02 | Discharge: 2024-01-02 | Disposition: A | Source: Ambulatory Visit | Attending: Vascular Surgery

## 2024-01-02 ENCOUNTER — Other Ambulatory Visit: Payer: Self-pay | Admitting: Hematology & Oncology

## 2024-01-02 ENCOUNTER — Ambulatory Visit (INDEPENDENT_AMBULATORY_CARE_PROVIDER_SITE_OTHER): Admitting: Physician Assistant

## 2024-01-02 VITALS — BP 208/81 | HR 74 | Temp 97.7°F | Wt 110.7 lb

## 2024-01-02 DIAGNOSIS — Z992 Dependence on renal dialysis: Secondary | ICD-10-CM

## 2024-01-02 DIAGNOSIS — N186 End stage renal disease: Secondary | ICD-10-CM

## 2024-01-02 NOTE — Telephone Encounter (Signed)
 SWO form received and placed in Aloi's folder for review.

## 2024-01-02 NOTE — Progress Notes (Signed)
    Postoperative Access Visit   History of Present Illness   Brittney Pace is a 68 y.o. year old female who presents for postoperative follow-up for: Left brachial axillary arteriovenous graft on 10/11/23 by Dr. Magda. The patient's wounds are well healed.  The patient notes no steal symptoms.  They have started to use her left AV graft without any issues. She currently dialyzes on TTS. She still has a right internal jugular TDC. She is scheduled to have this removed tomorrow but she says she will have to reschedule this with her HD center.   Physical Examination   Vitals:   01/02/24 0843  BP: (!) 208/81  Pulse: 74  Temp: 97.7 F (36.5 C)  TempSrc: Temporal  Weight: 110 lb 11.2 oz (50.2 kg)   Body mass index is 23.14 kg/m.  left arm Incision is well healed, 1+ radial pulse, hand grip is 5/5, sensation in digits is intact, palpable thrill, bruit can be auscultated     Non Invasive Vascular lab evaluation   VAS US  Duplex Dialysis Access (AVF, AVG): Summary: Patent left brachial-axillary arteriovenous graft.   Medical Decision Making   Brittney Pace is a 68 y.o. year old female who presents s/p Left brachial axillary arteriovenous graft on 10/11/23 by Dr. Magda. Her incision is well healed. Patent is without signs or symptoms of steal syndrome. Okay to continue use of her left AV graft. Duplex shows patent graft without any stenosis. The patient's tunneled dialysis catheter can be removed at discretion of HD center in near future The patient may follow up on a prn basis   Brittney Damme, PA-C [facility] Office: [phone]  Clinic MD:  Magda

## 2024-01-02 NOTE — Telephone Encounter (Signed)
 Patient is calling in stating her Dexcom T slim pump died. Please advise, call back number [phone]. States she can't change cartridge and it only alarms, believes motor broke . Dex[employer] is sending her a company secretary. She needs a call back today regarding her insulin  .

## 2024-01-03 ENCOUNTER — Ambulatory Visit (HOSPITAL_COMMUNITY): Admission: RE | Admit: 2024-01-03

## 2024-01-03 ENCOUNTER — Encounter (HOSPITAL_COMMUNITY): Payer: Self-pay

## 2024-01-10 ENCOUNTER — Ambulatory Visit (HOSPITAL_COMMUNITY)
Admission: RE | Admit: 2024-01-10 | Discharge: 2024-01-10 | Disposition: A | Discharge status: Home / Self Care | Source: Ambulatory Visit | Attending: Nephrology | Admitting: Nephrology

## 2024-01-10 DIAGNOSIS — Z4901 Encounter for fitting and adjustment of extracorporeal dialysis catheter: Secondary | ICD-10-CM | POA: Insufficient documentation

## 2024-01-10 DIAGNOSIS — N186 End stage renal disease: Secondary | ICD-10-CM | POA: Insufficient documentation

## 2024-01-10 HISTORY — PX: IR REMOVAL TUN CV CATH W/O FL: IMG2289

## 2024-01-10 MED ORDER — LIDOCAINE-EPINEPHRINE 1 %-1:100000 IJ SOLN
20.0000 mL | Freq: Once | INTRAMUSCULAR | Status: AC
  Administered 2024-01-10: 14:00:00 10 mL via INTRADERMAL

## 2024-01-10 MED ORDER — LIDOCAINE-EPINEPHRINE 1 %-1:100000 IJ SOLN
INTRAMUSCULAR | Status: AC
  Filled 2024-01-10: qty 20

## 2024-01-10 NOTE — Procedures (Signed)
 Interventional Radiology Procedure Note  PROCEDURE SUMMARY:  Successful removal of tunneled catheter.  No complications.   EBL = trace  Please see full dictation in imaging section of Epic for procedure details.  Electronically Signed: Carlin DELENA Griffon, PA-C 01/10/2024, 2:03 PM

## 2024-01-16 ENCOUNTER — Other Ambulatory Visit: Payer: Self-pay | Admitting: Neurosurgery

## 2024-01-22 ENCOUNTER — Inpatient Hospital Stay

## 2024-01-22 ENCOUNTER — Inpatient Hospital Stay: Attending: Hematology & Oncology

## 2024-01-22 ENCOUNTER — Inpatient Hospital Stay: Admitting: Hematology & Oncology

## 2024-01-22 VITALS — BP 159/69 | HR 69 | Temp 97.7°F | Resp 18 | Wt 107.0 lb

## 2024-01-22 DIAGNOSIS — D631 Anemia in chronic kidney disease: Secondary | ICD-10-CM | POA: Diagnosis present

## 2024-01-22 DIAGNOSIS — I120 Hypertensive chronic kidney disease with stage 5 chronic kidney disease or end stage renal disease: Secondary | ICD-10-CM | POA: Diagnosis present

## 2024-01-22 DIAGNOSIS — E1022 Type 1 diabetes mellitus with diabetic chronic kidney disease: Secondary | ICD-10-CM

## 2024-01-22 DIAGNOSIS — Z79899 Other long term (current) drug therapy: Secondary | ICD-10-CM | POA: Diagnosis not present

## 2024-01-22 DIAGNOSIS — N186 End stage renal disease: Secondary | ICD-10-CM | POA: Insufficient documentation

## 2024-01-22 DIAGNOSIS — D638 Anemia in other chronic diseases classified elsewhere: Secondary | ICD-10-CM

## 2024-01-22 DIAGNOSIS — N1831 Chronic kidney disease, stage 3a: Secondary | ICD-10-CM

## 2024-01-22 DIAGNOSIS — D508 Other iron deficiency anemias: Secondary | ICD-10-CM

## 2024-01-22 LAB — CMP (CANCER CENTER ONLY)
ALT: 14 U/L (ref 0–44)
AST: 35 U/L (ref 15–41)
Albumin: 4.1 g/dL (ref 3.5–5.0)
Alkaline Phosphatase: 109 U/L (ref 38–126)
Anion gap: 13 (ref 5–15)
BUN: 52 mg/dL — ABNORMAL HIGH (ref 8–23)
CO2: 21 mmol/L — ABNORMAL LOW (ref 22–32)
Calcium: 9.4 mg/dL (ref 8.9–10.3)
Chloride: 103 mmol/L (ref 98–111)
Creatinine: 3.12 mg/dL — ABNORMAL HIGH (ref 0.44–1.00)
GFR, Estimated: 15 mL/min — ABNORMAL LOW
Glucose, Bld: 191 mg/dL — ABNORMAL HIGH (ref 70–99)
Potassium: 5.2 mmol/L — ABNORMAL HIGH (ref 3.5–5.1)
Sodium: 136 mmol/L (ref 135–145)
Total Bilirubin: 0.3 mg/dL (ref 0.0–1.2)
Total Protein: 6.7 g/dL (ref 6.5–8.1)

## 2024-01-22 LAB — CBC WITH DIFFERENTIAL (CANCER CENTER ONLY)
Abs Immature Granulocytes: 0.05 K/uL (ref 0.00–0.07)
Basophils Absolute: 0.1 K/uL (ref 0.0–0.1)
Basophils Relative: 1 %
Eosinophils Absolute: 0.5 K/uL (ref 0.0–0.5)
Eosinophils Relative: 7 %
HCT: 29.7 % — ABNORMAL LOW (ref 36.0–46.0)
Hemoglobin: 9.9 g/dL — ABNORMAL LOW (ref 12.0–15.0)
Immature Granulocytes: 1 %
Lymphocytes Relative: 29 %
Lymphs Abs: 2.2 K/uL (ref 0.7–4.0)
MCH: 28.4 pg (ref 26.0–34.0)
MCHC: 33.3 g/dL (ref 30.0–36.0)
MCV: 85.1 fL (ref 80.0–100.0)
Monocytes Absolute: 0.8 K/uL (ref 0.1–1.0)
Monocytes Relative: 10 %
Neutro Abs: 4.1 K/uL (ref 1.7–7.7)
Neutrophils Relative %: 52 %
Platelet Count: 314 K/uL (ref 150–400)
RBC: 3.49 MIL/uL — ABNORMAL LOW (ref 3.87–5.11)
RDW: 13.8 % (ref 11.5–15.5)
WBC Count: 7.7 K/uL (ref 4.0–10.5)
nRBC: 0 % (ref 0.0–0.2)

## 2024-01-22 LAB — RETICULOCYTES
Immature Retic Fract: 5.1 % (ref 2.3–15.9)
RBC.: 3.48 MIL/uL — ABNORMAL LOW (ref 3.87–5.11)
Retic Count, Absolute: 20.2 K/uL (ref 19.0–186.0)
Retic Ct Pct: 0.6 % (ref 0.4–3.1)

## 2024-01-22 LAB — IRON AND IRON BINDING CAPACITY (CC-WL,HP ONLY)
Iron: 56 ug/dL (ref 28–170)
Saturation Ratios: 17 % (ref 10.4–31.8)
TIBC: 322 ug/dL (ref 250–450)
UIBC: 266 ug/dL

## 2024-01-22 LAB — FERRITIN: Ferritin: 133 ng/mL (ref 11–307)

## 2024-01-22 MED ORDER — DARBEPOETIN ALFA 300 MCG/0.6ML IJ SOSY
300.0000 ug | PREFILLED_SYRINGE | Freq: Once | INTRAMUSCULAR | Status: AC
  Administered 2024-01-22: 300 ug via SUBCUTANEOUS
  Filled 2024-01-22: qty 0.6

## 2024-01-22 NOTE — Patient Instructions (Signed)

## 2024-01-30 ENCOUNTER — Telehealth: Payer: Self-pay | Admitting: *Deleted

## 2024-01-30 NOTE — Telephone Encounter (Signed)
 Pt called [facility] requesting a Rx for Ambien . Returned call to pt advised Dr. Ennever will not be able to give her a Rx for Ambien  and to reach out to her Nephrologist. Pt verbalized understanding and thanked me for the call back. No further concerns at this time.

## 2024-02-01 NOTE — Pre-Procedure Instructions (Signed)
 Surgical Instructions   Your procedure is scheduled on Monday, January 12th. Report to [facility] Main Entrance A at 06:00 A.M., then check in with the Admitting office. Any questions or running late day of surgery: call [phone]  Questions prior to your surgery date: call [phone], Monday-Friday, 8am-4pm. If you experience any cold or flu symptoms such as cough, fever, chills, shortness of breath, etc. between now and your scheduled surgery, please notify us  at the above number.     Remember:  Do not eat or drink after midnight the night before your surgery     Take these medicines the morning of surgery with A SIP OF WATER  carvedilol  (COREG )  esomeprazole (NEXIUM)  thyroid  (ARMOUR THYROID )   May take these medicines IF NEEDED: fluticasone  (FLONASE )  hydrOXYzine  (ATARAX )  levocetirizine (XYZAL)  oxyCODONE  (ROXICODONE )  valACYclovir  (VALTREX )   Follow your surgeon's instructions on when to stop Aspirin  and Plavix .  If no instructions were given by your surgeon then you will need to call the office to get those instructions.     One week prior to surgery, STOP taking any Aleve, Naproxen, Ibuprofen, Motrin, Advil, Goody's, BC's, all herbal medications, fish oil, and non-prescription vitamins. This includes diclofenac  Sodium (VOLTAREN ) gel.  WHAT DO I DO ABOUT MY DIABETES MEDICATION?   Insulin  pump: Contact PCP or Endocrinologist OR Reduce all basal rates by 20% at midnight   HOW TO MANAGE YOUR DIABETES BEFORE AND AFTER SURGERY  Why is it important to control my blood sugar before and after surgery? Improving blood sugar levels before and after surgery helps healing and can limit problems. A way of improving blood sugar control is eating a healthy diet by:  Eating less sugar and carbohydrates  Increasing activity/exercise  Talking with your doctor about reaching your blood sugar goals High blood sugars (greater than 180 mg/dL) can raise your risk of infections  and slow your recovery, so you will need to focus on controlling your diabetes during the weeks before surgery. Make sure that the doctor who takes care of your diabetes knows about your planned surgery including the date and location.  How do I manage my blood sugar before surgery? Check your blood sugar at least 4 times a day, starting 2 days before surgery, to make sure that the level is not too high or low.  Check your blood sugar the morning of your surgery when you wake up and every 2 hours until you get to the Short Stay unit.  If your blood sugar is less than 70 mg/dL, you will need to treat for low blood sugar: Do not take insulin . Treat a low blood sugar (less than 70 mg/dL) with  cup of clear juice (cranberry or apple), 4 glucose tablets, OR glucose gel. Recheck blood sugar in 15 minutes after treatment (to make sure it is greater than 70 mg/dL). If your blood sugar is not greater than 70 mg/dL on recheck, call [phone] for further instructions. Report your blood sugar to the short stay nurse when you get to Short Stay.  If you are admitted to the hospital after surgery: Your blood sugar will be checked by the staff and you will probably be given insulin  after surgery (instead of oral diabetes medicines) to make sure you have good blood sugar levels. The goal for blood sugar control after surgery is 80-180 mg/dL.                     Do NOT Smoke (  Tobacco/Vaping) for 24 hours prior to your procedure.  If you use a CPAP at night, you may bring your mask/headgear for your overnight stay.   You will be asked to remove any contacts, glasses, piercing's, hearing aid's, dentures/partials prior to surgery. Please bring cases for these items if needed.    Your surgeon will determine if you are to be admitted or discharged the same day.  Patients discharged the day of surgery will not be allowed to drive home, and someone needs to stay with them for 24 hours.  SURGICAL WAITING ROOM  VISITATION Patients may have no more than 2 support people in the waiting area - these visitors may rotate.   Pre-op  nurse will coordinate an appropriate time for 2 ADULT support persons, who may not rotate, to accompany patient in pre-op .  Children under the age of 17 must have an adult with them who is not the patient and must remain in the main waiting area with an adult.  If the patient needs to stay at the hospital during part of their recovery, the visitor guidelines for inpatient rooms apply.  Please refer to the [facility] website for the visitor guidelines for any additional information.   If you received a COVID test during your pre-op  visit  it is requested that you wear a mask when out in public, stay away from anyone that may not be feeling well and notify your surgeon if you develop symptoms. If you have been in contact with anyone that has tested positive in the last 10 days please notify you surgeon.      Pre-operative CHG Bathing Instructions   You can play a key role in reducing the risk of infection after surgery. Your skin needs to be as free of germs as possible. You can reduce the number of germs on your skin by washing with CHG (chlorhexidine  gluconate) soap before surgery. CHG is an antiseptic soap that kills germs and continues to kill germs even after washing.   DO NOT use if you have an allergy to chlorhexidine /CHG or antibacterial soaps. If your skin becomes reddened or irritated, stop using the CHG and notify one of our RNs at [phone].              TAKE A SHOWER THE NIGHT BEFORE SURGERY   Please keep in mind the following:  DO NOT shave, including legs and underarms, 48 hours prior to surgery.   You may shave your face before/day of surgery.  Place clean sheets on your bed the night before surgery Use a clean washcloth (not used since being washed) for shower. DO NOT sleep with pet's night before surgery.  CHG Shower Instructions:  Wash your face and  private area with normal soap. If you choose to wash your hair, wash first with your normal shampoo.  After you use shampoo/soap, rinse your hair and body thoroughly to remove shampoo/soap residue.  Turn the water OFF and apply half the bottle of CHG soap to a CLEAN washcloth.  Apply CHG soap ONLY FROM YOUR NECK DOWN TO YOUR TOES (washing for 3-5 minutes)  DO NOT use CHG soap on face, private areas, open wounds, or sores.  Pay special attention to the area where your surgery is being performed.  If you are having back surgery, having someone wash your back for you may be helpful. Wait 2 minutes after CHG soap is applied, then you may rinse off the CHG soap.  Pat dry with a clean towel  Put on clean pajamas    Additional instructions for the day of surgery: If you choose, you may shower the morning of surgery with an antibacterial soap.  DO NOT APPLY any lotions, deodorants, cologne, or perfumes.   Do not wear jewelry or makeup Do not wear nail polish, gel polish, artificial nails, or any other type of covering on natural nails (fingers and toes) Do not bring valuables to the hospital. [facility] is not responsible for valuables/personal belongings. Put on clean/comfortable clothes.  Please brush your teeth.  Ask your nurse before applying any prescription medications to the skin.

## 2024-02-02 ENCOUNTER — Encounter (HOSPITAL_COMMUNITY)
Admission: RE | Admit: 2024-02-02 | Discharge: 2024-02-02 | Disposition: A | Source: Ambulatory Visit | Attending: Neurosurgery

## 2024-02-02 ENCOUNTER — Other Ambulatory Visit: Payer: Self-pay

## 2024-02-02 ENCOUNTER — Other Ambulatory Visit (HOSPITAL_COMMUNITY)

## 2024-02-02 ENCOUNTER — Encounter (HOSPITAL_COMMUNITY): Payer: Self-pay

## 2024-02-02 VITALS — BP 138/76 | HR 71 | Temp 98.1°F | Resp 17 | Ht <= 58 in | Wt 107.4 lb

## 2024-02-02 DIAGNOSIS — E1143 Type 2 diabetes mellitus with diabetic autonomic (poly)neuropathy: Secondary | ICD-10-CM | POA: Insufficient documentation

## 2024-02-02 DIAGNOSIS — I340 Nonrheumatic mitral (valve) insufficiency: Secondary | ICD-10-CM | POA: Insufficient documentation

## 2024-02-02 DIAGNOSIS — I252 Old myocardial infarction: Secondary | ICD-10-CM | POA: Insufficient documentation

## 2024-02-02 DIAGNOSIS — E1122 Type 2 diabetes mellitus with diabetic chronic kidney disease: Secondary | ICD-10-CM | POA: Insufficient documentation

## 2024-02-02 DIAGNOSIS — I509 Heart failure, unspecified: Secondary | ICD-10-CM | POA: Insufficient documentation

## 2024-02-02 DIAGNOSIS — K219 Gastro-esophageal reflux disease without esophagitis: Secondary | ICD-10-CM | POA: Insufficient documentation

## 2024-02-02 DIAGNOSIS — Z955 Presence of coronary angioplasty implant and graft: Secondary | ICD-10-CM | POA: Insufficient documentation

## 2024-02-02 DIAGNOSIS — Z01818 Encounter for other preprocedural examination: Secondary | ICD-10-CM | POA: Insufficient documentation

## 2024-02-02 DIAGNOSIS — G910 Communicating hydrocephalus: Secondary | ICD-10-CM | POA: Insufficient documentation

## 2024-02-02 DIAGNOSIS — Z951 Presence of aortocoronary bypass graft: Secondary | ICD-10-CM | POA: Insufficient documentation

## 2024-02-02 DIAGNOSIS — K589 Irritable bowel syndrome without diarrhea: Secondary | ICD-10-CM | POA: Insufficient documentation

## 2024-02-02 DIAGNOSIS — D631 Anemia in chronic kidney disease: Secondary | ICD-10-CM | POA: Insufficient documentation

## 2024-02-02 DIAGNOSIS — I358 Other nonrheumatic aortic valve disorders: Secondary | ICD-10-CM | POA: Insufficient documentation

## 2024-02-02 DIAGNOSIS — G9389 Other specified disorders of brain: Secondary | ICD-10-CM | POA: Insufficient documentation

## 2024-02-02 DIAGNOSIS — M810 Age-related osteoporosis without current pathological fracture: Secondary | ICD-10-CM | POA: Insufficient documentation

## 2024-02-02 DIAGNOSIS — Z8673 Personal history of transient ischemic attack (TIA), and cerebral infarction without residual deficits: Secondary | ICD-10-CM | POA: Insufficient documentation

## 2024-02-02 DIAGNOSIS — G4733 Obstructive sleep apnea (adult) (pediatric): Secondary | ICD-10-CM | POA: Insufficient documentation

## 2024-02-02 DIAGNOSIS — Z87891 Personal history of nicotine dependence: Secondary | ICD-10-CM | POA: Insufficient documentation

## 2024-02-02 DIAGNOSIS — N186 End stage renal disease: Secondary | ICD-10-CM | POA: Insufficient documentation

## 2024-02-02 DIAGNOSIS — I341 Nonrheumatic mitral (valve) prolapse: Secondary | ICD-10-CM | POA: Insufficient documentation

## 2024-02-02 DIAGNOSIS — E1059 Type 1 diabetes mellitus with other circulatory complications: Secondary | ICD-10-CM

## 2024-02-02 DIAGNOSIS — I2510 Atherosclerotic heart disease of native coronary artery without angina pectoris: Secondary | ICD-10-CM | POA: Insufficient documentation

## 2024-02-02 DIAGNOSIS — E039 Hypothyroidism, unspecified: Secondary | ICD-10-CM | POA: Insufficient documentation

## 2024-02-02 DIAGNOSIS — M797 Fibromyalgia: Secondary | ICD-10-CM | POA: Insufficient documentation

## 2024-02-02 DIAGNOSIS — D509 Iron deficiency anemia, unspecified: Secondary | ICD-10-CM | POA: Insufficient documentation

## 2024-02-02 DIAGNOSIS — K3184 Gastroparesis: Secondary | ICD-10-CM | POA: Insufficient documentation

## 2024-02-02 DIAGNOSIS — Z992 Dependence on renal dialysis: Secondary | ICD-10-CM | POA: Insufficient documentation

## 2024-02-02 LAB — BASIC METABOLIC PANEL WITH GFR
Anion gap: 12 (ref 5–15)
BUN: 27 mg/dL — ABNORMAL HIGH (ref 8–23)
CO2: 28 mmol/L (ref 22–32)
Calcium: 9.2 mg/dL (ref 8.9–10.3)
Chloride: 94 mmol/L — ABNORMAL LOW (ref 98–111)
Creatinine, Ser: 2.22 mg/dL — ABNORMAL HIGH (ref 0.44–1.00)
GFR, Estimated: 23 mL/min — ABNORMAL LOW
Glucose, Bld: 233 mg/dL — ABNORMAL HIGH (ref 70–99)
Potassium: 4.4 mmol/L (ref 3.5–5.1)
Sodium: 134 mmol/L — ABNORMAL LOW (ref 135–145)

## 2024-02-02 LAB — CBC
HCT: 34.4 % — ABNORMAL LOW (ref 36.0–46.0)
Hemoglobin: 11 g/dL — ABNORMAL LOW (ref 12.0–15.0)
MCH: 28.2 pg (ref 26.0–34.0)
MCHC: 32 g/dL (ref 30.0–36.0)
MCV: 88.2 fL (ref 80.0–100.0)
Platelets: 281 K/uL (ref 150–400)
RBC: 3.9 MIL/uL (ref 3.87–5.11)
RDW: 15.9 % — ABNORMAL HIGH (ref 11.5–15.5)
WBC: 7.1 K/uL (ref 4.0–10.5)
nRBC: 0 % (ref 0.0–0.2)

## 2024-02-02 LAB — HEMOGLOBIN A1C
Hgb A1c MFr Bld: 7.8 % — ABNORMAL HIGH (ref 4.8–5.6)
Mean Plasma Glucose: 177.16 mg/dL

## 2024-02-02 LAB — GLUCOSE, CAPILLARY: Glucose-Capillary: 227 mg/dL — ABNORMAL HIGH (ref 70–99)

## 2024-02-02 NOTE — Progress Notes (Signed)
 PCP - Denies Endocrinologist: Dr. Fairy Ralph Oncologist: Dr. Maude Crease Nephrologist: Riccardo, Dialysis T, Th, Sat Cardiologist - Used to be Dr. Pietro  PPM/ICD - denies Device Orders - na Rep Notified - na  Chest x-ray -  EKG - 10/14/2023 Stress Test - 07/17/2017 ECHO - 03/19/2022 Cardiac Cath - 2000  Sleep Study - + sleep apnea CPAP - denies  Type 1 Diabetic. A1C 6.9 on 11/10/2023, new A1C drawn during PAT appointment. Blood sugar 227 at PAT, patient at lunch at 0900. Patient has an insulin  pump. CGM  Dexcom to left buttocks Fasting Blood Sugar - 99-120 Checks Blood Sugar: continuous Follow Endocrinologist's instructions or reduce insulin  pump basal rate by 20% at midnight  Last dose of GLP1 agonist-  denies GLP1 instructions: na  Blood Thinner Instructions: Plavix , states last dose 01/31/2024 Aspirin  Instructions:ASA, states last dose 01/31/2024  ERAS Protcol - NPO  Anesthesia review: Yes. CAD, OSA, MI, CVA, type 1 DM, ESRD  Patient denies shortness of breath, fever, cough and chest pain at PAT appointment   All instructions explained to the patient, with a verbal understanding of the material. Patient agrees to go over the instructions while at home for a better understanding. Patient also instructed to self quarantine after being tested for COVID-19. The opportunity to ask questions was provided.

## 2024-02-02 NOTE — Progress Notes (Signed)
 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  History includes former smoker, postoperative N/V, DM1 (retinopathy, nephropathy, neuropathy, gastroparesis), ESRD, CAD (s/p CABG X 2: LIMA-LAD, SVG to distal RCA in [place], 1999, stenting of LIMA-LAD in 2000), CHF, MVP, hypothyroidism, edema, fibromyalgia, CVA (multiple), iron deficiency anemia, GERD, IBS, OSA (does not use CPAP), normal pressure hydrocephalus.  Follows with cardiology for history of CAD.  She had a prior MI in 1999 with subsequent CABG (LIMA to LAD; SVG to RCA) and reported subsequent stenting of LIMA graft in 2000. Her last heart catheterization was August 2015 at [facility] which revealed two-vessel coronary disease with a patent LIMA to the LAD and SVG to the RCA. No obstructive disease in left circumflex; EF 65. Echocardiogram February 2024 showed normal LV function, mild RV dysfunction, mild right ventricular enlargement.  Carotid Dopplers December 2024 showed no evidence of hemodynamically significant stenosis.  Last seen by Dr. Pietro on 04/12/2023 for routine follow-up.  Stable at that time from cardiac standpoint, no changes to management, discussed that she may eventually need to reduce blood pressure medications now that she is on dialysis.  1 year follow-up recommended.   02/02/2024 A1c 7.8%, up from 6.9% on 11/10/2023.  She has a Dexcom CGM.  Reported fasting CBGs 99-120. To decreased insulin  pump basal by 20% the MN before surgery or per her endocrinologist's recommendations.  S/p left brachial axillary arteriovenous  graft on 10/11/2023 by Dr. Magda. She had removal of TDC by IR on 01/10/2024.  Has HD on TTS schedule.  Reported last Plavix  and aspirin  is 01/31/2024.  Anesthesia team to evaluate on the day of surgery.  She will need an i-STAT on arrival given ESRD.   VS: BP 138/76   Pulse 71   Temp 36.7 C   Resp 17   Ht 4' 10 (1.473 m)   Wt 48.7 kg   SpO2 100%   BMI 22.45 kg/m    PROVIDERS: Timmy Maude SAUNDERS, MD is CHERIE Pietro Rogue, MD is cardiologist  Lamona Pac, MD is endocrinologist Magda ned, MD is vascular surgeon.SABRA Pac permission to hold ASA and Plavix  for 5 days prior to right occipital shunt placement.    LABS: Preoperative labs noted.  (all labs ordered are listed, but only abnormal results are displayed)  Labs Reviewed  GLUCOSE, CAPILLARY - Abnormal; Notable for the following components:      Result Value   Glucose-Capillary 227 (*)    All other components within normal limits  HEMOGLOBIN A1C - Abnormal; Notable for the following components:   Hgb A1c MFr Bld 7.8 (*)    All other components within normal limits  BASIC METABOLIC PANEL WITH GFR - Abnormal; Notable for the following components:   Sodium 134 (*)    Chloride 94 (*)    Glucose, Bld 233 (*)    BUN 27 (*)    Creatinine, Ser 2.22 (*)    GFR, Estimated 23 (*)    All other components within normal limits  CBC - Abnormal; Notable for the following components:   Hemoglobin 11.0 (*)    HCT 34.4 (*)  RDW 15.9 (*)    All other components within normal limits    IMAGES: MRI Brain 02/19/2023: IMPRESSION: 1. No acute intracranial process. 2. Mild ventriculomegaly with an acute callosal angle (70 degrees). No significant crowding of the sulci at the vertex. Although nonspecific, findings can be seen in the setting of NPH.    EKG: 10/14/2023: Sinus rhythm.  Right axis deviation.   CV: US  Carotid 01/19/2023 ([facility]): IMPRESSION: - No evidence of hemodynamically significant stenosis in either  carotid system.  - Antegrade flow within both vertebral arteries.    TTE 03/19/22: 1. Left ventricular ejection fraction, by estimation, is 55 to 60%. The  left ventricle has normal function. The left ventricle has no regional  wall motion abnormalities. Left ventricular diastolic parameters were  normal.   2. Right ventricular systolic function is mildly reduced. The right  ventricular size is mildly enlarged. There is mildly elevated pulmonary  artery systolic pressure. The estimated right ventricular systolic  pressure is 42.8 mmHg.   3. The mitral valve is degenerative. Trivial mitral valve regurgitation.   4. The aortic valve is tricuspid. There is mild calcification of the  aortic valve. Aortic valve regurgitation is not visualized. Aortic valve  sclerosis is present, with no evidence of aortic valve stenosis. Aortic  valve mean gradient measures 5.0 mmHg.   5. The inferior vena cava is normal in size with <50% respiratory  variability, suggesting right atrial pressure of 8 mmHg.      Cardiac Cath 09/18/2013: Occluded ostial LAD. LIMA-LAD widely patent with stent in LAD. LCX/OM without obstructive disease. RCA with obstructive disease but patent, patent SVG-DRCA . LVEF normal. No gradient.   Past Medical History:  Diagnosis Date   Anemia    Arthritis    Babesiasis    secondary due to lyme disease   CHF (congestive heart failure) (HCC)    Chronic kidney disease    stage 3   Complication of anesthesia    Coronary artery disease    Depression    Diabetes mellitus without complication (HCC)    Type 1   Diabetic retinopathy (HCC)    Erythropoietin  deficiency anemia 10/22/2018   ESRD (end stage renal disease) on dialysis [facility])    dialysis T,TH,S at [facility], [place]   Family history of adverse reaction to anesthesia    mother:  while she was under she stopped breathing.   Fibromyalgia    Gastroparesis    GERD (gastroesophageal reflux disease)    Headache     migraines   Hypothyroidism    IBS (irritable bowel syndrome)    Idiopathic edema    Iron deficiency anemia 09/04/2019   Lyme disease    Mitral valve prolapse    Myocardial infarction (HCC)    1 major in 1999 and 2 minor  small vessel disease.   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  the second stroke was a lacunar     Past Surgical History:  Procedure Laterality Date   ABDOMINAL HYSTERECTOMY     APPENDECTOMY     BREAST SURGERY     B/L biopsy and lumpectomy    CARDIAC CATHETERIZATION     CARPAL TUNNEL RELEASE  CATARACT EXTRACTION W/ INTRAOCULAR LENS IMPLANT     right eye   COLONOSCOPY W/ BIOPSIES AND POLYPECTOMY     CORONARY ARTERY BYPASS GRAFT     coronary artery stents     at LAD and LIMA   DIALYSIS/PERMA CATHETER INSERTION N/A 03/24/2023   Procedure: DIALYSIS/PERMA CATHETER INSERTION;  Surgeon: Tobie Gordy POUR, MD;  Location: [facility] INVASIVE CV LAB;  Service: Cardiovascular;  Laterality: N/A;   ECTOPIC PREGNANCY SURGERY     INSERTION OF ARTERIOVENOUS (AV) ARTEGRAFT ARM Left 10/11/2023   Procedure: LEFT UPPER EXTREMITY, ARTERIOVENOUS GORE-TEX 4-7MM STRETCH GRAFT INSERTION;  Surgeon: Magda [name], MD;  Location: [facility];  Service: Vascular;  Laterality: Left;   IR FLUORO GUIDE CV LINE RIGHT  05/22/2023   IR REMOVAL TUN CV CATH W/O FL  01/10/2024   NASAL SEPTUM SURGERY     OPEN REDUCTION INTERNAL FIXATION (ORIF) DISTAL RADIAL FRACTURE Right 05/06/2015   Procedure: OPEN REDUCTION INTERNAL FIXATION (ORIF) RIGHT DISTAL RADIAL FRACTURE AND REPAIRS AS NEEDED;  Surgeon: Prentice Pagan, MD;  Location: [facility] OR;  Service: Orthopedics;  Laterality: Right;   ORIF HUMERUS FRACTURE Right 04/30/2020   Procedure: OPEN REDUCTION INTERNAL FIXATION (ORIF) PROXIMAL HUMERUS  FRACTURE;  Surgeon: Melita Drivers, MD;  Location: [facility] ORS;  Service: Orthopedics;  Laterality: Right;    ORIF WRIST FRACTURE Left 11/05/2014   ORIF WRIST FRACTURE Left 11/05/2014   Procedure: OPEN REDUCTION INTERNAL FIXATION (ORIF) LEFT WRIST FRACTURE AND REPAIR AS INDICATED;  Surgeon: Prentice Pagan, MD;  Location: [facility] OR;  Service: Orthopedics;  Laterality: Left;   TRIGGER FINGER RELEASE     TUNNELLED CATHETER EXCHANGE N/A 06/01/2023   Procedure: TUNNELLED CATHETER EXCHANGE;  Surgeon: Melia Lynwood ORN, MD;  Location: [facility] INVASIVE CV LAB;  Service: Cardiovascular;  Laterality: N/A;    MEDICATIONS:  ALPHA-LIPOIC ACID PO   aspirin  EC 325 MG tablet   b complex-vitamin c -folic acid  (NEPHRO-VITE) 0.8 MG TABS tablet   carvedilol  (COREG ) 12.5 MG tablet   cetirizine (ZYRTEC) 10 MG tablet   clobetasol  (TEMOVATE ) 0.05 % external solution   clopidogrel  (PLAVIX ) 75 MG tablet   Darbepoetin Alfa -Albumin (ARANESP  IJ)   dexamethasone  (DECADRON ) 0.1 % ophthalmic solution   diclofenac  Sodium (VOLTAREN ) 1 % GEL   diphenoxylate -atropine  (LOMOTIL ) 2.5-0.025 MG tablet   esomeprazole (NEXIUM) 20 MG capsule   fluocinonide  (LIDEX ) 0.05 % external solution   hydrOXYzine  (ATARAX ) 10 MG tablet   ketoconazole  (NIZORAL ) 2 % shampoo   ketorolac  (ACULAR ) 0.5 % ophthalmic solution   lidocaine -prilocaine  (EMLA ) cream   Melatonin Gummies 2.5 MG CHEW   nitroGLYCERIN  (NITROLINGUAL ) 0.4 MG/SPRAY spray   NOVOLOG  100 UNIT/ML injection   OVER THE COUNTER MEDICATION   OVER THE COUNTER MEDICATION   oxyCODONE  (ROXICODONE ) 5 MG immediate release tablet   prednisoLONE  acetate (PRED FORTE ) 1 % ophthalmic suspension   PREMARIN  vaginal cream   thyroid  (ARMOUR THYROID ) 60 MG tablet   torsemide  ([name] ) 20 MG tablet   valACYclovir  (VALTREX ) 500 MG tablet    Darbepoetin Alfa  (ARANESP ) injection 300 mcg    Bodhi Stenglein, PA-C Surgical Short Stay/Anesthesiology [facility] Phone [phone] [facility] Phone [phone] 02/02/2024 3:48  PM

## 2024-02-02 NOTE — Anesthesia Preprocedure Evaluation (Signed)
 "                                  Anesthesia Evaluation  Patient identified by MRN, date of birth, ID band Patient awake    Reviewed: Allergy & Precautions, NPO status , Patient's Chart, lab work & pertinent test results  History of Anesthesia Complications (+) PONV, Family history of anesthesia reaction and history of anesthetic complications  Airway Mallampati: I  TM Distance: >3 FB Neck ROM: Full    Dental  (+) Dental Advisory Given   Pulmonary asthma , sleep apnea , former smoker   breath sounds clear to auscultation       Cardiovascular hypertension, + CAD, + Past MI, + Peripheral Vascular Disease and +CHF   Rhythm:Regular Rate:Normal  TTE (2024)  1. Left ventricular ejection fraction, by estimation, is 55 to 60%. The  left ventricle has normal function. The left ventricle has no regional  wall motion abnormalities. Left ventricular diastolic parameters were  normal.   2. Right ventricular systolic function is mildly reduced. The right  ventricular size is mildly enlarged. There is mildly elevated pulmonary  artery systolic pressure. The estimated right ventricular systolic  pressure is 42.8 mmHg.   3. The mitral valve is degenerative. Trivial mitral valve regurgitation.   4. The aortic valve is tricuspid. There is mild calcification of the  aortic valve. Aortic valve regurgitation is not visualized. Aortic valve  sclerosis is present, with no evidence of aortic valve stenosis. Aortic  valve mean gradient measures 5.0 mmHg.   5. The inferior vena cava is normal in size with <50% respiratory  variability, suggesting right atrial pressure of 8 mmHg.     Neuro/Psych  Headaches PSYCHIATRIC DISORDERS  Depression     Neuromuscular disease CVA, Residual Symptoms    GI/Hepatic ,GERD  Poorly Controlled,,Severe gastroparesis   Endo/Other  diabetesHypothyroidism    Renal/GU Renal disease     Musculoskeletal  (+) Arthritis ,  Fibromyalgia -  Abdominal    Peds  Hematology  (+) Blood dyscrasia, anemia   Anesthesia Other Findings   Reproductive/Obstetrics                              Anesthesia Physical Anesthesia Plan  ASA: 4  Anesthesia Plan: General   Post-op Pain Management: Ofirmev  IV (intra-op)*   Induction: Intravenous and Cricoid pressure planned  PONV Risk Score and Plan: 2 and 4 or greater and Ondansetron , Dexamethasone  and Treatment may vary due to age or medical condition  Airway Management Planned: Oral ETT  Additional Equipment: None  Intra-op Plan:   Post-operative Plan: Extubation in OR  Informed Consent:      Dental advisory given  Plan Discussed with: CRNA and Anesthesiologist  Anesthesia Plan Comments: (PAT note written 02/02/2024 by Isaiah Ruder, PA-C.   DISCUSSION: Patient is a 69 year old female scheduled for the above procedure.   History includes former smoker, postoperative N/V, DM1 (retinopathy, nephropathy, neuropathy, gastroparesis), ESRD, CAD (s/p CABG X 2: LIMA-LAD, SVG to distal RCA in [place], 1999, stenting of LIMA-LAD in 2000), CHF, MVP, hypothyroidism, edema, fibromyalgia, CVA (multiple), iron deficiency anemia, GERD, IBS, OSA (does not use CPAP), normal pressure hydrocephalus.   Follows with cardiology for history of CAD.  She had a prior MI in 1999 with subsequent CABG (LIMA to LAD; SVG to RCA) and reported subsequent stenting of LIMA graft  in 2000. Her last heart catheterization was August 2015 at [facility] which revealed two-vessel coronary disease with a patent LIMA to the LAD and SVG to the RCA. No obstructive disease in left circumflex; EF 65. Echocardiogram February 2024 showed normal LV function, mild RV dysfunction, mild right ventricular enlargement.  Carotid Dopplers December 2024 showed no evidence of hemodynamically significant stenosis.  Last seen by Dr. Pietro on 04/12/2023 for routine follow-up.  Stable at that time from cardiac standpoint, no  changes to management, discussed that she may eventually need to reduce blood pressure medications now that she is on dialysis.  1 year follow-up recommended.    02/02/2024 A1c 7.8%, up from 6.9% on 11/10/2023.  She has a Dexcom CGM.  Reported fasting CBGs 99-120. To decreased insulin  pump basal by 20% the MN before surgery or per her endocrinologist's recommendations.   S/p left brachial axillary arteriovenous graft on 10/11/2023 by Dr. Magda. She had removal of TDC by IR on 01/10/2024.  Has HD on TTS schedule.   Reported last Plavix  and aspirin  is 01/31/2024.   Anesthesia team to evaluate on the day of surgery.  She will need an i-STAT on arrival given ESRD.  )         Anesthesia Quick Evaluation  "

## 2024-02-05 ENCOUNTER — Other Ambulatory Visit: Payer: Self-pay

## 2024-02-05 ENCOUNTER — Inpatient Hospital Stay (HOSPITAL_COMMUNITY): Payer: Self-pay | Admitting: Anesthesiology

## 2024-02-05 ENCOUNTER — Encounter (HOSPITAL_COMMUNITY): Admission: RE | Disposition: A | Payer: Self-pay | Source: Home / Self Care | Attending: Neurosurgery

## 2024-02-05 ENCOUNTER — Encounter (HOSPITAL_COMMUNITY): Payer: Self-pay | Admitting: Neurosurgery

## 2024-02-05 ENCOUNTER — Encounter (HOSPITAL_COMMUNITY): Payer: Self-pay | Admitting: Vascular Surgery

## 2024-02-05 ENCOUNTER — Inpatient Hospital Stay (HOSPITAL_COMMUNITY)
Admission: RE | Admit: 2024-02-05 | Discharge: 2024-02-06 | DRG: 031 | Disposition: A | Discharge status: Home / Self Care | Attending: Neurosurgery | Admitting: Neurosurgery

## 2024-02-05 DIAGNOSIS — F17210 Nicotine dependence, cigarettes, uncomplicated: Secondary | ICD-10-CM | POA: Diagnosis present

## 2024-02-05 DIAGNOSIS — E1059 Type 1 diabetes mellitus with other circulatory complications: Secondary | ICD-10-CM

## 2024-02-05 DIAGNOSIS — I2510 Atherosclerotic heart disease of native coronary artery without angina pectoris: Secondary | ICD-10-CM | POA: Diagnosis present

## 2024-02-05 DIAGNOSIS — G910 Communicating hydrocephalus: Secondary | ICD-10-CM

## 2024-02-05 DIAGNOSIS — K3184 Gastroparesis: Secondary | ICD-10-CM | POA: Diagnosis present

## 2024-02-05 DIAGNOSIS — N186 End stage renal disease: Secondary | ICD-10-CM | POA: Diagnosis present

## 2024-02-05 DIAGNOSIS — K219 Gastro-esophageal reflux disease without esophagitis: Secondary | ICD-10-CM | POA: Diagnosis present

## 2024-02-05 DIAGNOSIS — E1051 Type 1 diabetes mellitus with diabetic peripheral angiopathy without gangrene: Secondary | ICD-10-CM | POA: Diagnosis present

## 2024-02-05 DIAGNOSIS — N2581 Secondary hyperparathyroidism of renal origin: Secondary | ICD-10-CM | POA: Diagnosis present

## 2024-02-05 DIAGNOSIS — Z9641 Presence of insulin pump (external) (internal): Secondary | ICD-10-CM | POA: Diagnosis present

## 2024-02-05 DIAGNOSIS — G9389 Other specified disorders of brain: Secondary | ICD-10-CM | POA: Diagnosis present

## 2024-02-05 DIAGNOSIS — Z91013 Allergy to seafood: Secondary | ICD-10-CM | POA: Diagnosis not present

## 2024-02-05 DIAGNOSIS — F32A Depression, unspecified: Secondary | ICD-10-CM | POA: Diagnosis present

## 2024-02-05 DIAGNOSIS — Z8673 Personal history of transient ischemic attack (TIA), and cerebral infarction without residual deficits: Secondary | ICD-10-CM

## 2024-02-05 DIAGNOSIS — I5033 Acute on chronic diastolic (congestive) heart failure: Secondary | ICD-10-CM

## 2024-02-05 DIAGNOSIS — Z951 Presence of aortocoronary bypass graft: Secondary | ICD-10-CM | POA: Diagnosis not present

## 2024-02-05 DIAGNOSIS — Z8249 Family history of ischemic heart disease and other diseases of the circulatory system: Secondary | ICD-10-CM | POA: Diagnosis not present

## 2024-02-05 DIAGNOSIS — G4733 Obstructive sleep apnea (adult) (pediatric): Secondary | ICD-10-CM | POA: Diagnosis present

## 2024-02-05 DIAGNOSIS — E1022 Type 1 diabetes mellitus with diabetic chronic kidney disease: Secondary | ICD-10-CM | POA: Diagnosis present

## 2024-02-05 DIAGNOSIS — Z87891 Personal history of nicotine dependence: Secondary | ICD-10-CM | POA: Diagnosis not present

## 2024-02-05 DIAGNOSIS — K589 Irritable bowel syndrome without diarrhea: Secondary | ICD-10-CM | POA: Diagnosis present

## 2024-02-05 DIAGNOSIS — E739 Lactose intolerance, unspecified: Secondary | ICD-10-CM | POA: Diagnosis present

## 2024-02-05 DIAGNOSIS — E10319 Type 1 diabetes mellitus with unspecified diabetic retinopathy without macular edema: Secondary | ICD-10-CM | POA: Diagnosis present

## 2024-02-05 DIAGNOSIS — I110 Hypertensive heart disease with heart failure: Secondary | ICD-10-CM | POA: Diagnosis not present

## 2024-02-05 DIAGNOSIS — D631 Anemia in chronic kidney disease: Secondary | ICD-10-CM | POA: Diagnosis present

## 2024-02-05 DIAGNOSIS — M797 Fibromyalgia: Secondary | ICD-10-CM | POA: Diagnosis present

## 2024-02-05 DIAGNOSIS — Z955 Presence of coronary angioplasty implant and graft: Secondary | ICD-10-CM | POA: Diagnosis not present

## 2024-02-05 DIAGNOSIS — Z794 Long term (current) use of insulin: Secondary | ICD-10-CM | POA: Diagnosis not present

## 2024-02-05 DIAGNOSIS — Z7902 Long term (current) use of antithrombotics/antiplatelets: Secondary | ICD-10-CM

## 2024-02-05 DIAGNOSIS — Z7982 Long term (current) use of aspirin: Secondary | ICD-10-CM | POA: Diagnosis not present

## 2024-02-05 DIAGNOSIS — Z79899 Other long term (current) drug therapy: Secondary | ICD-10-CM

## 2024-02-05 DIAGNOSIS — E039 Hypothyroidism, unspecified: Secondary | ICD-10-CM | POA: Diagnosis present

## 2024-02-05 DIAGNOSIS — E1043 Type 1 diabetes mellitus with diabetic autonomic (poly)neuropathy: Secondary | ICD-10-CM | POA: Diagnosis present

## 2024-02-05 DIAGNOSIS — Z992 Dependence on renal dialysis: Secondary | ICD-10-CM

## 2024-02-05 DIAGNOSIS — I252 Old myocardial infarction: Secondary | ICD-10-CM

## 2024-02-05 DIAGNOSIS — Z9849 Cataract extraction status, unspecified eye: Secondary | ICD-10-CM

## 2024-02-05 DIAGNOSIS — Z961 Presence of intraocular lens: Secondary | ICD-10-CM | POA: Diagnosis present

## 2024-02-05 DIAGNOSIS — M810 Age-related osteoporosis without current pathological fracture: Secondary | ICD-10-CM | POA: Diagnosis present

## 2024-02-05 HISTORY — PX: VENTRICULOPERITONEAL SHUNT: SHX204

## 2024-02-05 LAB — POCT I-STAT, CHEM 8
BUN: 54 mg/dL — ABNORMAL HIGH (ref 8–23)
Calcium, Ion: 0.89 mmol/L — CL (ref 1.15–1.40)
Chloride: 101 mmol/L (ref 98–111)
Creatinine, Ser: 3.1 mg/dL — ABNORMAL HIGH (ref 0.44–1.00)
Glucose, Bld: 160 mg/dL — ABNORMAL HIGH (ref 70–99)
HCT: 36 % (ref 36.0–46.0)
Hemoglobin: 12.2 g/dL (ref 12.0–15.0)
Potassium: 5.4 mmol/L — ABNORMAL HIGH (ref 3.5–5.1)
Sodium: 132 mmol/L — ABNORMAL LOW (ref 135–145)
TCO2: 25 mmol/L (ref 22–32)

## 2024-02-05 LAB — GLUCOSE, CAPILLARY
Glucose-Capillary: 150 mg/dL — ABNORMAL HIGH (ref 70–99)
Glucose-Capillary: 157 mg/dL — ABNORMAL HIGH (ref 70–99)
Glucose-Capillary: 196 mg/dL — ABNORMAL HIGH (ref 70–99)

## 2024-02-05 LAB — SURGICAL PCR SCREEN
MRSA, PCR: NEGATIVE
Staphylococcus aureus: NEGATIVE

## 2024-02-05 MED ORDER — FENTANYL CITRATE (PF) 100 MCG/2ML IJ SOLN
INTRAMUSCULAR | Status: AC
  Filled 2024-02-05: qty 2

## 2024-02-05 MED ORDER — ONDANSETRON HCL 4 MG/2ML IJ SOLN
INTRAMUSCULAR | Status: DC | PRN
  Administered 2024-02-05: 4 mg via INTRAVENOUS

## 2024-02-05 MED ORDER — TORSEMIDE 20 MG PO TABS: 20.0000 mg | ORAL_TABLET | Freq: Every day | ORAL | Status: DC | PRN

## 2024-02-05 MED ORDER — DEXAMETHASONE SODIUM PHOSPHATE 0.1 % OP SOLN: 1.0000 [drp] | Freq: Every day | OPHTHALMIC | Status: DC | PRN

## 2024-02-05 MED ORDER — PHENOL 1.4 % MT LIQD: 1.0000 | OROMUCOSAL | Status: DC | PRN

## 2024-02-05 MED ORDER — RENA-VITE PO TABS
1.0000 | ORAL_TABLET | Freq: Every day | ORAL | Status: DC
  Administered 2024-02-05: 1 via ORAL
  Filled 2024-02-05 (×2): qty 1

## 2024-02-05 MED ORDER — FENTANYL CITRATE (PF) 100 MCG/2ML IJ SOLN
25.0000 ug | INTRAMUSCULAR | Status: DC | PRN
  Administered 2024-02-05: 50 ug via INTRAVENOUS
  Administered 2024-02-05: 25 ug via INTRAVENOUS

## 2024-02-05 MED ORDER — DEXAMETHASONE SOD PHOSPHATE PF 10 MG/ML IJ SOLN
INTRAMUSCULAR | Status: DC | PRN
  Administered 2024-02-05: 5 mg via INTRAVENOUS

## 2024-02-05 MED ORDER — CHLORHEXIDINE GLUCONATE CLOTH 2 % EX PADS: 6.0000 | MEDICATED_PAD | Freq: Once | CUTANEOUS | Status: DC

## 2024-02-05 MED ORDER — LIDOCAINE 2% (20 MG/ML) 5 ML SYRINGE
INTRAMUSCULAR | Status: DC | PRN
  Administered 2024-02-05: 100 mg via INTRAVENOUS

## 2024-02-05 MED ORDER — THROMBIN (RECOMBINANT) 5000 UNITS EX SOLR
CUTANEOUS | Status: DC | PRN
  Administered 2024-02-05: 10 mL via TOPICAL

## 2024-02-05 MED ORDER — CLOBETASOL PROPIONATE 0.05 % EX SOLN: 1.0000 | Freq: Two times a day (BID) | CUTANEOUS | Status: DC

## 2024-02-05 MED ORDER — KETOROLAC TROMETHAMINE 0.5 % OP SOLN: 1.0000 [drp] | Freq: Two times a day (BID) | OPHTHALMIC | Status: DC

## 2024-02-05 MED ORDER — ORAL CARE MOUTH RINSE: 15.0000 mL | Freq: Once | OROMUCOSAL | Status: AC

## 2024-02-05 MED ORDER — SODIUM CHLORIDE 0.9% FLUSH: 3.0000 mL | INTRAVENOUS | Status: DC | PRN

## 2024-02-05 MED ORDER — OXYCODONE HCL 5 MG PO TABS: 5.0000 mg | ORAL_TABLET | Freq: Once | ORAL | Status: DC | PRN

## 2024-02-05 MED ORDER — PROPOFOL 10 MG/ML IV BOLUS
INTRAVENOUS | Status: DC | PRN
  Administered 2024-02-05: 100 mg via INTRAVENOUS

## 2024-02-05 MED ORDER — PHENYLEPHRINE HCL-NACL 20-0.9 MG/250ML-% IV SOLN
INTRAVENOUS | Status: DC | PRN
  Administered 2024-02-05: 20 ug/min via INTRAVENOUS

## 2024-02-05 MED ORDER — CYCLOBENZAPRINE HCL 10 MG PO TABS
10.0000 mg | ORAL_TABLET | Freq: Three times a day (TID) | ORAL | Status: DC | PRN
  Administered 2024-02-05: 10 mg via ORAL
  Filled 2024-02-05: qty 1

## 2024-02-05 MED ORDER — PREDNISOLONE ACETATE 1 % OP SUSP: 1.0000 [drp] | Freq: Two times a day (BID) | OPHTHALMIC | Status: DC

## 2024-02-05 MED ORDER — LACTATED RINGERS IV SOLN: INTRAVENOUS | Status: DC

## 2024-02-05 MED ORDER — ONDANSETRON HCL 4 MG PO TABS: 4.0000 mg | ORAL_TABLET | Freq: Four times a day (QID) | ORAL | Status: DC | PRN

## 2024-02-05 MED ORDER — HYDROMORPHONE HCL 1 MG/ML IJ SOLN: 1.0000 mg | INTRAMUSCULAR | Status: DC | PRN

## 2024-02-05 MED ORDER — CHLORHEXIDINE GLUCONATE CLOTH 2 % EX PADS
6.0000 | MEDICATED_PAD | Freq: Every day | CUTANEOUS | Status: DC
  Administered 2024-02-06: 6 via TOPICAL

## 2024-02-05 MED ORDER — INSULIN ASPART 100 UNIT/ML IJ SOLN: 0.0000 [IU] | INTRAMUSCULAR | Status: DC

## 2024-02-05 MED ORDER — MENTHOL 3 MG MT LOZG: 1.0000 | LOZENGE | OROMUCOSAL | Status: DC | PRN

## 2024-02-05 MED ORDER — INSULIN ASPART 100 UNIT/ML IJ SOLN: 0.0000 [IU] | INTRAMUSCULAR | Status: DC | PRN

## 2024-02-05 MED ORDER — DEXMEDETOMIDINE HCL IN NACL 80 MCG/20ML IV SOLN
INTRAVENOUS | Status: DC | PRN
  Administered 2024-02-05: 4 ug via INTRAVENOUS

## 2024-02-05 MED ORDER — HYDROXYZINE HCL 10 MG PO TABS: 10.0000 mg | ORAL_TABLET | Freq: Four times a day (QID) | ORAL | Status: DC | PRN

## 2024-02-05 MED ORDER — PANTOPRAZOLE SODIUM 40 MG PO TBEC
40.0000 mg | DELAYED_RELEASE_TABLET | Freq: Every day | ORAL | Status: DC
  Administered 2024-02-05 – 2024-02-06 (×2): 40 mg via ORAL
  Filled 2024-02-05 (×2): qty 1

## 2024-02-05 MED ORDER — PROPOFOL 10 MG/ML IV BOLUS
INTRAVENOUS | Status: AC
  Filled 2024-02-05: qty 20

## 2024-02-05 MED ORDER — THROMBIN (RECOMBINANT) 5000 UNITS EX SOLR
CUTANEOUS | Status: AC
  Filled 2024-02-05: qty 5000

## 2024-02-05 MED ORDER — NITROGLYCERIN 0.4 MG/SPRAY TL SOLN: 1.0000 | Status: DC | PRN

## 2024-02-05 MED ORDER — CARVEDILOL 12.5 MG PO TABS
12.5000 mg | ORAL_TABLET | Freq: Once | ORAL | Status: AC
  Administered 2024-02-05: 12.5 mg via ORAL
  Filled 2024-02-05: qty 1

## 2024-02-05 MED ORDER — SUGAMMADEX SODIUM 200 MG/2ML IV SOLN
INTRAVENOUS | Status: DC | PRN
  Administered 2024-02-05: 200 mg via INTRAVENOUS

## 2024-02-05 MED ORDER — THROMBIN 5000 UNITS EX KIT
PACK | CUTANEOUS | Status: AC
  Filled 2024-02-05: qty 2

## 2024-02-05 MED ORDER — 0.9 % SODIUM CHLORIDE (POUR BTL) OPTIME
TOPICAL | Status: DC | PRN
  Administered 2024-02-05: 1000 mL

## 2024-02-05 MED ORDER — PHENYLEPHRINE 80 MCG/ML (10ML) SYRINGE FOR IV PUSH (FOR BLOOD PRESSURE SUPPORT): PREFILLED_SYRINGE | INTRAVENOUS | Status: DC | PRN

## 2024-02-05 MED ORDER — SODIUM CHLORIDE 0.9 % IV SOLN: 250.0000 mL | INTRAVENOUS | Status: AC

## 2024-02-05 MED ORDER — ONDANSETRON HCL 4 MG/2ML IJ SOLN
4.0000 mg | Freq: Four times a day (QID) | INTRAMUSCULAR | Status: DC | PRN
  Administered 2024-02-06: 4 mg via INTRAVENOUS
  Filled 2024-02-05: qty 2

## 2024-02-05 MED ORDER — ONDANSETRON HCL 4 MG/2ML IJ SOLN: 4.0000 mg | Freq: Once | INTRAMUSCULAR | Status: DC | PRN

## 2024-02-05 MED ORDER — THYROID 60 MG PO TABS
60.0000 mg | ORAL_TABLET | ORAL | Status: DC
  Filled 2024-02-05 (×2): qty 1

## 2024-02-05 MED ORDER — CEFAZOLIN SODIUM-DEXTROSE 1-4 GM/50ML-% IV SOLN
1.0000 g | Freq: Once | INTRAVENOUS | Status: AC
  Administered 2024-02-06: 1 g via INTRAVENOUS
  Filled 2024-02-05: qty 50

## 2024-02-05 MED ORDER — LORATADINE 10 MG PO TABS: 10.0000 mg | ORAL_TABLET | Freq: Every day | ORAL | Status: DC

## 2024-02-05 MED ORDER — INSULIN PUMP
Freq: Three times a day (TID) | SUBCUTANEOUS | Status: DC
  Administered 2024-02-06: 0.75 via SUBCUTANEOUS
  Administered 2024-02-06: 0.4 via SUBCUTANEOUS
  Filled 2024-02-05: qty 1

## 2024-02-05 MED ORDER — DIPHENOXYLATE-ATROPINE 2.5-0.025 MG PO TABS: 1.0000 | ORAL_TABLET | Freq: Every day | ORAL | Status: DC | PRN

## 2024-02-05 MED ORDER — MEPERIDINE HCL 25 MG/ML IJ SOLN: 6.2500 mg | INTRAMUSCULAR | Status: DC | PRN

## 2024-02-05 MED ORDER — ACETAMINOPHEN 650 MG RE SUPP: 650.0000 mg | RECTAL | Status: DC | PRN

## 2024-02-05 MED ORDER — ROCURONIUM BROMIDE 10 MG/ML (PF) SYRINGE
PREFILLED_SYRINGE | INTRAVENOUS | Status: DC | PRN
  Administered 2024-02-05: 50 mg via INTRAVENOUS

## 2024-02-05 MED ORDER — SODIUM CHLORIDE 0.9 % IV SOLN: INTRAVENOUS | Status: DC | PRN

## 2024-02-05 MED ORDER — PROPOFOL 500 MG/50ML IV EMUL
INTRAVENOUS | Status: DC | PRN
  Administered 2024-02-05: 150 ug/kg/min via INTRAVENOUS

## 2024-02-05 MED ORDER — LABETALOL HCL 5 MG/ML IV SOLN
INTRAVENOUS | Status: DC | PRN
  Administered 2024-02-05: 2.5 mg via INTRAVENOUS

## 2024-02-05 MED ORDER — CARVEDILOL 12.5 MG PO TABS
12.5000 mg | ORAL_TABLET | Freq: Every day | ORAL | Status: DC
  Filled 2024-02-05: qty 1

## 2024-02-05 MED ORDER — ACETAMINOPHEN 325 MG PO TABS: 650.0000 mg | ORAL_TABLET | ORAL | Status: DC | PRN

## 2024-02-05 MED ORDER — OXYCODONE HCL 5 MG PO TABS
5.0000 mg | ORAL_TABLET | ORAL | Status: DC | PRN
  Administered 2024-02-05 – 2024-02-06 (×3): 10 mg via ORAL
  Administered 2024-02-06: 5 mg via ORAL
  Administered 2024-02-06: 10 mg via ORAL
  Filled 2024-02-05 (×4): qty 2
  Filled 2024-02-05: qty 1

## 2024-02-05 MED ORDER — OXYCODONE HCL 5 MG/5ML PO SOLN: 5.0000 mg | Freq: Once | ORAL | Status: DC | PRN

## 2024-02-05 MED ORDER — CHLORHEXIDINE GLUCONATE 0.12 % MT SOLN
15.0000 mL | Freq: Once | OROMUCOSAL | Status: AC
  Administered 2024-02-05: 15 mL via OROMUCOSAL
  Filled 2024-02-05: qty 15

## 2024-02-05 MED ORDER — CEFAZOLIN SODIUM-DEXTROSE 2-4 GM/100ML-% IV SOLN
2.0000 g | INTRAVENOUS | Status: AC
  Administered 2024-02-05: 2 g via INTRAVENOUS
  Filled 2024-02-05: qty 100

## 2024-02-05 MED ORDER — SODIUM CHLORIDE 0.9% FLUSH
3.0000 mL | Freq: Two times a day (BID) | INTRAVENOUS | Status: DC
  Administered 2024-02-05 – 2024-02-06 (×3): 3 mL via INTRAVENOUS

## 2024-02-05 MED ORDER — FENTANYL CITRATE (PF) 250 MCG/5ML IJ SOLN
INTRAMUSCULAR | Status: DC | PRN
  Administered 2024-02-05 (×2): 50 ug via INTRAVENOUS

## 2024-02-05 NOTE — Anesthesia Procedure Notes (Signed)
 Procedure Name: Intubation Date/Time: 02/05/2024 8:11 AM  Performed by: Scherrie Mast, CRNAPre-anesthesia Checklist: Patient identified, Emergency Drugs available, Suction available and Patient being monitored Patient Re-evaluated:Patient Re-evaluated prior to induction Oxygen Delivery Method: Circle System Utilized Preoxygenation: Pre-oxygenation with 100% oxygen Induction Type: IV induction Ventilation: Mask ventilation without difficulty Laryngoscope Size: Mac and 3 Grade View: Grade I Tube type: Oral Tube size: 7.0 mm Number of attempts: 1 Airway Equipment and Method: Stylet and Oral airway Placement Confirmation: ETT inserted through vocal cords under direct vision, positive ETCO2 and breath sounds checked- equal and bilateral Secured at: 21 cm Tube secured with: Tape Dental Injury: Teeth and Oropharynx as per pre-operative assessment

## 2024-02-05 NOTE — H&P (Signed)
 " Brittney Pace is an 69 y.o. female.   Chief Complaint: Hydrocephalus HPI: 69 year old female presents with increasing difficulty with unsteadiness and gait abnormalities with some headaches and worsening memory function.  Workup demonstrates evidence of moderate ventriculomegaly consistent with hydrocephalus.  The patient is regarding on diagnostic and therapeutic lumbar drainage which demonstrates increased opening pressure and marked improvement following lumbar drainage.  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   Fibromyalgia    Gastroparesis    GERD (gastroesophageal reflux disease)    Headache    migraines   Hypothyroidism    IBS (irritable bowel syndrome)    Idiopathic edema    Iron deficiency anemia 09/04/2019   Lyme disease    Mitral valve prolapse    Myocardial infarction (HCC)    1 major in 1999 and 2 minor  small vessel disease.   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  the second stroke was a lacunar     Past Surgical History:  Procedure Laterality Date   ABDOMINAL HYSTERECTOMY     APPENDECTOMY     BREAST SURGERY     B/L biopsy and  lumpectomy    CARDIAC CATHETERIZATION     CARPAL TUNNEL RELEASE     CATARACT EXTRACTION W/ INTRAOCULAR LENS IMPLANT     right eye   COLONOSCOPY W/ BIOPSIES AND POLYPECTOMY     CORONARY ARTERY BYPASS GRAFT     coronary artery stents     at [facility] and [place]   DIALYSIS/PERMA CATHETER INSERTION N/A 03/24/2023   Procedure: DIALYSIS/PERMA CATHETER INSERTION;  Surgeon: Tobie Gordy POUR, MD;  Location: [facility] INVASIVE CV LAB;  Service: Cardiovascular;  Laterality: N/A;   ECTOPIC PREGNANCY SURGERY     INSERTION OF ARTERIOVENOUS (AV) ARTEGRAFT ARM Left 10/11/2023   Procedure: LEFT UPPER EXTREMITY, ARTERIOVENOUS GORE-TEX 4-7MM STRETCH GRAFT INSERTION;  Surgeon: Magda [name], MD;  Location: [facility] OR;  Service: Vascular;  Laterality: Left;   IR FLUORO GUIDE CV LINE RIGHT  05/22/2023   IR REMOVAL TUN CV CATH W/O FL  01/10/2024   NASAL SEPTUM SURGERY     OPEN REDUCTION INTERNAL FIXATION (ORIF) DISTAL RADIAL FRACTURE Right 05/06/2015   Procedure: OPEN REDUCTION INTERNAL FIXATION (ORIF) RIGHT DISTAL RADIAL FRACTURE AND REPAIRS AS NEEDED;  Surgeon: Prentice Pagan, MD;  Location: [facility] OR;  Service: Orthopedics;  Laterality: Right;   ORIF HUMERUS FRACTURE Right 04/30/2020   Procedure: OPEN REDUCTION INTERNAL FIXATION (ORIF) PROXIMAL HUMERUS FRACTURE;  Surgeon: Melita,  Franky, MD;  Location: [facility] ORS;  Service: Orthopedics;  Laterality: Right;    ORIF WRIST FRACTURE Left 11/05/2014   ORIF WRIST FRACTURE Left 11/05/2014   Procedure: OPEN REDUCTION INTERNAL FIXATION (ORIF) LEFT WRIST FRACTURE AND REPAIR AS INDICATED;  Surgeon: Prentice Pagan, MD;  Location: [facility] OR;  Service: Orthopedics;  Laterality: Left;   TRIGGER FINGER RELEASE     TUNNELLED CATHETER EXCHANGE N/A 06/01/2023   Procedure: TUNNELLED CATHETER EXCHANGE;  Surgeon: Melia Lynwood ORN, MD;  Location: [facility] INVASIVE CV LAB;  Service: Cardiovascular;  Laterality: N/A;    Family History  Problem Relation Age of Onset   Breast cancer Mother    Migraines Mother    Heart disease  Father    Hypertension Father    Breast cancer Sister    Social History:  reports that she has quit smoking. Her smoking use included cigarettes. She started smoking about 51 years ago. She has a 51 pack-year smoking history. She has never used smokeless tobacco. She reports current alcohol use of about 7.0 standard drinks of alcohol per week. She reports that she does not use drugs.  Allergies: Allergies[1]  Medications Prior to Admission  Medication Sig Dispense Refill   ALPHA-LIPOIC ACID PO Take 1 capsule by mouth daily.     aspirin  EC 325 MG tablet Take 1 tablet (325 mg total) by mouth daily. 30 tablet 0   b complex-vitamin c -folic acid  (NEPHRO-VITE) 0.8 MG TABS tablet Take 1 tablet by mouth daily.     carvedilol  (COREG ) 12.5 MG tablet TAKE 1 TABLET BY MOUTH TWICE DAILY WITH A MEAL (Patient taking differently: Take 12.5 mg by mouth See admin instructions. Take 12.5 mg by mouth on Sunday, Monday, Wednesday, and Friday. Take 12.5 once daily after dialysis on Tuesday, Thursday, Saturday.) 180 tablet 3   cetirizine (ZYRTEC) 10 MG tablet Take 10 mg by mouth at bedtime.     clobetasol  (TEMOVATE ) 0.05 % external solution Apply 1 Application topically 2 (two) times daily.     clopidogrel  (PLAVIX ) 75 MG tablet Take 1 tablet (75 mg total) by mouth daily. 30 tablet 11   Darbepoetin Alfa -Albumin (ARANESP  IJ) Inject as directed.     dexamethasone  (DECADRON ) 0.1 % ophthalmic solution Place 1 drop into the left eye daily as needed (Left ear daily.).     diclofenac  Sodium (VOLTAREN ) 1 % GEL Apply 1 Application topically daily as needed (pain).     diphenoxylate -atropine  (LOMOTIL ) 2.5-0.025 MG tablet TAKE 1 TABLET BY MOUTH 4 TIMES DAILY AS NEEDED FOR DIARRHEA OR  LOOSE  STOOLS (Patient taking differently: Take 1 tablet by mouth daily as needed for diarrhea or loose stools.) 45 tablet 0   esomeprazole (NEXIUM) 20 MG capsule Take 20 mg by mouth daily.     fluocinonide  (LIDEX ) 0.05 % external solution Apply 1  Application topically daily. (Patient taking differently: Apply 1 Application topically every 14 (fourteen) days.) 60 mL 0   hydrOXYzine  (ATARAX ) 10 MG tablet Take 1 tablet (10 mg total) by mouth every 6 (six) hours as needed for itching. 30 tablet 1   ketoconazole  (NIZORAL ) 2 % shampoo Apply 1 Application topically 2 (two) times a week.     ketorolac  (ACULAR ) 0.5 % ophthalmic solution Place 1 drop into both eyes 2 (two) times daily.     lidocaine -prilocaine  (EMLA ) cream Apply 1 Application topically 3 (three) times a week.     Melatonin Gummies 2.5 MG CHEW Chew 2.5 mg by mouth at bedtime as needed (sleep).  NOVOLOG  100 UNIT/ML injection Inject 0-10 Units into the skin See admin instructions. VIA Insulin  pump     OVER THE COUNTER MEDICATION Take 1 capsule by mouth at bedtime. Conjugated linoleic acid     OVER THE COUNTER MEDICATION Take 3 tablets by mouth at bedtime. Magnesium  Threonate     oxyCODONE  (ROXICODONE ) 5 MG immediate release tablet Take 1 tablet (5 mg total) by mouth every 8 (eight) hours as needed. 20 tablet 0   prednisoLONE  acetate (PRED FORTE ) 1 % ophthalmic suspension Place 1 drop into both eyes 2 (two) times daily.     thyroid  (ARMOUR THYROID ) 60 MG tablet Take 60 mg by mouth See admin instructions. Take 60 mg by mouth once daily on Sunday, Monday, Wednesday, and Friday.     torsemide  ([name] ) 20 MG tablet Take 2 tablets (40 mg total) by mouth daily. (Patient taking differently: Take 20 mg by mouth daily as needed (swelling).)     valACYclovir  (VALTREX ) 500 MG tablet Take 500 mg by mouth daily as needed (Flair up).     nitroGLYCERIN  (NITROLINGUAL ) 0.4 MG/SPRAY spray Place 1 spray under the tongue every 5 (five) minutes x 3 doses as needed. (Patient taking differently: Place 1 spray under the tongue every 5 (five) minutes x 3 doses as needed for chest pain.) 12 g 3   PREMARIN  vaginal cream Place 1 applicator vaginally daily as needed (irritation). (Patient not taking: Reported on  02/02/2024)      Results for orders placed or performed during the hospital encounter of 02/05/24 (from the past 48 hours)  Glucose, capillary     Status: Abnormal   Collection Time: 02/05/24  6:43 AM  Result Value Ref Range   Glucose-Capillary 150 (H) 70 - 99 mg/dL    Comment: Glucose reference range applies only to samples taken after fasting for at least 8 hours.  I-STAT, chem 8     Status: Abnormal   Collection Time: 02/05/24  7:23 AM  Result Value Ref Range   Sodium 132 (L) 135 - 145 mmol/L   Potassium 5.4 (H) 3.5 - 5.1 mmol/L   Chloride 101 98 - 111 mmol/L   BUN 54 (H) 8 - 23 mg/dL   Creatinine, Ser 6.89 (H) 0.44 - 1.00 mg/dL   Glucose, Bld 839 (H) 70 - 99 mg/dL    Comment: Glucose reference range applies only to samples taken after fasting for at least 8 hours.   Calcium , Ion 0.89 (LL) 1.15 - 1.40 mmol/L   TCO2 25 22 - 32 mmol/L   Hemoglobin 12.2 12.0 - 15.0 g/dL   HCT 63.9 63.9 - 53.9 %   Comment NOTIFIED PHYSICIAN    No results found.  Pertinent items noted in HPI and remainder of comprehensive ROS otherwise negative.  Blood pressure (!) 186/71, pulse 71, temperature 98.9 F (37.2 C), temperature source Oral, resp. rate 17, height 4' 10 (1.473 m), weight 47.6 kg, SpO2 100%.  Patient is awake and alert.  She is oriented and appropriate.  Speech is fluent.  Judgment insight are intact.  Cranial nerve function normal bilaterally.  Motor examination reveals Motor strength bilaterally.  Sensory examination nonfocal.  Reflexes normal active.  Examination head ears nose and throat is unremarked.  Chest and abdomen are benign.  Extremities are free of major deformity. Assessment/Plan Communicating hydrocephalus.  Plan right occipital VP shunt.  Risks and benefits of been explained.  Patient wishes to proceed.  Victory A Delories Mauri 02/05/2024, 7:45 AM       [  1]  Allergies Allergen Reactions   Morphine  Anaphylaxis and Shortness Of Breath   Penicillins Hives    Tolerated ANCEF  on  04/30/20    Tramadol Anaphylaxis   Acetaminophen  Other (See Comments)    Alters insulin  pump readings    Amitriptyline Other (See Comments)    Severe headache/ out of body feeling   Codeine Other (See Comments)    Severe headaches/ out of body feeling    Ibuprofen Other (See Comments)    Messes up CGM reading on glucose monitor     Krill Oil Diarrhea, Itching and Nausea And Vomiting   Lactose Intolerance (Gi) Diarrhea   Losartan Cough   Propoxyphene Other (See Comments)    Severe headaches / out of body feeling    Shellfish Allergy Diarrhea and Nausea And Vomiting   Statins Other (See Comments)    Muscle pains Other reaction(s): Other   Sulfamethoxazole Other (See Comments)    Mouth ulcers    Sulfites Itching and Other (See Comments)    Mouth ulcers   Ace Inhibitors Other (See Comments)    Other Reaction(s): Unknown   Fluconazole     Other reaction(s): Contact Dermatitis (intolerance)   Norvasc  [Amlodipine  Besylate] Swelling   "

## 2024-02-05 NOTE — Anesthesia Postprocedure Evaluation (Signed)
"   Anesthesia Post Note  Patient: Brittney Pace  Procedure(s) Performed: SHUNT INSERTION VENTRICULAR-PERITONEAL (Right)     Patient location during evaluation: PACU Anesthesia Type: General Level of consciousness: awake and alert Pain management: pain level controlled Vital Signs Assessment: post-procedure vital signs reviewed and stable Respiratory status: spontaneous breathing, nonlabored ventilation, respiratory function stable and patient connected to nasal cannula oxygen Cardiovascular status: blood pressure returned to baseline and stable Postop Assessment: no apparent nausea or vomiting Anesthetic complications: no   There were no known notable events for this encounter.  Last Vitals:  Vitals:   02/05/24 1030 02/05/24 1100  BP: (!) 178/63 (!) 183/79  Pulse: 60 60  Resp: 14 19  Temp: 37.1 C 37.1 C  SpO2: 100% 100%    Last Pain:  Vitals:   02/05/24 1411  TempSrc:   PainSc: 7                  [name]      "

## 2024-02-05 NOTE — Inpatient Diabetes Management (Signed)
 Inpatient Diabetes Program Recommendations  AACE/ADA: New Consensus Statement on Inpatient Glycemic Control   Target Ranges:  Prepandial:   less than 140 mg/dL      Peak postprandial:   less than 180 mg/dL (1-2 hours)      Critically ill patients:  140 - 180 mg/dL    Latest Reference Range & Units 02/05/24 06:43  Glucose-Capillary 70 - 99 mg/dL 849 (H)  (H): Data is abnormally high Review of Glycemic Control  Diabetes history: DM1 Outpatient Diabetes medications: T-Slim Insulin  Pump with Novolog , Dexcom G7 CGM Current orders for Inpatient glycemic control: Novolog  0-14 units Q2H PRN  Inpatient Diabetes Program Recommendations:    Insulin : Please discontinue Novolog  0-14 units Q2H PRN since patient has insulin  pump on and she is very sensitive to insulin  (1 unit drops glucose 105-110 mg/dl).  If patient is admitted following surgery, please use Insulin  Pump order set to order CBGs and Insulin  Pump AC&HS and 2am.  NOTE: Patient currently in PO area for right occipital VP shunt today. Per chart review, noted patient has Type 1 DM and uses an insulin  pump for DM control. Noted patient sees [facility] and was seen by E. Lauri, PharmD on 11/10/23 and insulin  pump settings are noted to be:  Basal Rate  Total Basal Dose: 5.4 units/day  Time units/hr  12:00 AM 0.1  4:00 AM 0.1  9:00 AM 0.11  12:00 PM 0.36  7:00 PM 0.33   Blood Glucose Target  Time mg/dL  87:99 AM 849 - 849   Sensitivity Factor  Time mg/dL/unit  87:99 AM 889  5:99 AM 110  9:00 AM 105  12:00 PM 105  7:00 PM 110   Carb Ratio  Time g/unit  12:00 AM 18   If patient is admitted, will need to use Insulin  Pump order set to order CBGs and Insulin  Pump AC&HS and 2 am.   Thanks, Earnie Gainer, RN, MSN, CDCES Diabetes Coordinator Inpatient Diabetes Program [phone] (Team Pager from 8am to 5pm)

## 2024-02-05 NOTE — Transfer of Care (Signed)
 Immediate Anesthesia Transfer of Care Note  Patient: Brittney Pace  Procedure(s) Performed: SHUNT INSERTION VENTRICULAR-PERITONEAL (Right)  Patient Location: PACU  Anesthesia Type:General  Level of Consciousness: awake, alert , and oriented  Airway & Oxygen Therapy: Patient Spontanous Breathing and Patient connected to nasal cannula oxygen  Post-op Assessment: Report given to RN and Post -op Vital signs reviewed and stable  Post vital signs: Reviewed and stable  Last Vitals:  Vitals Value Taken Time  BP 169/58 02/05/24 09:28  Temp 37 0928  Pulse 61 02/05/24 09:31  Resp 16 02/05/24 09:31  SpO2 75 % 02/05/24 09:31  Vitals shown include unfiled device data.  Last Pain:  Vitals:   02/05/24 0714  TempSrc:   PainSc: 0-No pain         Complications: No notable events documented.

## 2024-02-05 NOTE — Brief Op Note (Signed)
 02/05/2024  8:00 AM  9:12 AM  PATIENT:  Brittney Pace  69 y.o. female  PRE-OPERATIVE DIAGNOSIS:  Chronic communicating hydrocephalus  POST-OPERATIVE DIAGNOSIS:  Chronic communicating hydrocephalus  PROCEDURE:  Procedures with comments: SHUNT INSERTION VENTRICULAR-PERITONEAL (Right) - Shunt Placment - right occipital  SURGEON:  Surgeons and Role:    DEWAINE Louis Shove, MD - Primary  PHYSICIAN ASSISTANT:   ASSISTANTSBETHA Jennetta PIETY   ANESTHESIA:   general  EBL:  minimal   BLOOD ADMINISTERED:none  DRAINS: none   LOCAL MEDICATIONS USED:  NONE  SPECIMEN:  No Specimen  DISPOSITION OF SPECIMEN:  N/A  COUNTS:  YES  TOURNIQUET:  * No tourniquets in log *  DICTATION: .Dragon Dictation  PLAN OF CARE: Admit to inpatient   PATIENT DISPOSITION:  PACU - hemodynamically stable.   Delay start of Pharmacological VTE agent (>24hrs) due to surgical blood loss or risk of bleeding: yes

## 2024-02-05 NOTE — Op Note (Signed)
 Date of procedure: 02/05/2024  Date of dictation: Same  Service: Neurosurgery  Preoperative diagnosis: Communicating hydrocephalus  Postoperative diagnosis: Same  Procedure Name: Right occipital ventriculoperitoneal shunt  Surgeon:Jahni Nazar A.Leshay Desaulniers, M.D.  Asst. Surgeon: Jennetta, NP  Anesthesia: General  Indication: 69 year old female with chronic and progressively worsening gait ataxia with increasing short-term memory dysfunction.  Workup demonstrates evidence of moderately severe ventriculomegaly with Abran ependymal edema.  Patient has undergone diagnostic and therapeutic lumbar puncture which demonstrated elevated opening pressure and good response to therapeutic drainage with improvement of her symptoms.  She presents now for right occipital VP shunt  Operative note: After induction of anesthesia, patient's right occipital neck chest and abdomen were prepped and draped sterilely.  A paramedian incision was made in her epigastric region.  This carried down sharply to the anterior rectus sheath.  The anterior rectus sheath was divided.  Of the rectus muscle was quite atrophic but this was separated and the posterior rectus sheath was elevated and incised.  The peritoneal sac was visualized.  The peritoneal sac was then bluntly entered and good visualization of the intraperitoneal compartment was achieved.  Attention placed to the right occipital region.  A incision was made approximately 3 fingerbreadths above the pinna and in her right occipital region.  This carried down sharply to the paracranium.  A subcutaneous pocket was made inferior to the incision for shunt valve placement.  A bur hole was then made using high-speed drill.  Underlying dura was intact and visualized.  Shunt passer was then passed from the cranial wound through the abdominal wound.  The strata 2 intercrural valve and distal catheter was then passed through the shunt passer in good position.  The valve had been set at level  1.0 preoperatively.  The dura was then coagulated and incised.  A 9 cm ventricular catheter was then placed into the right occipital horn with good return of CSF as it coursed through the lateral ventricle.  Snap connector was applied to the shunt system and ventricular catheter.  Good flow was obtained through the distal catheter.  The distal catheter was then passed under direct visualization into the peritoneal compartment without difficulty.  The posterior rectus sheath was reapproximated using 3-0 Vicryl suture.  The anterior rectus sheath was reapproximated using 3-0 Vicryl suture.  Skin was reapproximated using 3-0 Vicryl suture.  Steri-Strips and sterile dressing were applied to the abdominal wound.  Occipital wound was closed with 2-0 Vicryl suture at the galea and staples at the surface.  No apparent complications.  Patient tolerated the procedure well and she returns to the recovery room postop.

## 2024-02-05 NOTE — Plan of Care (Signed)

## 2024-02-06 ENCOUNTER — Encounter (HOSPITAL_COMMUNITY): Payer: Self-pay | Admitting: Neurosurgery

## 2024-02-06 LAB — RENAL FUNCTION PANEL
Albumin: 3.6 g/dL (ref 3.5–5.0)
Anion gap: 13 (ref 5–15)
BUN: 41 mg/dL — ABNORMAL HIGH (ref 8–23)
CO2: 23 mmol/L (ref 22–32)
Calcium: 8.8 mg/dL — ABNORMAL LOW (ref 8.9–10.3)
Chloride: 98 mmol/L (ref 98–111)
Creatinine, Ser: 3.18 mg/dL — ABNORMAL HIGH (ref 0.44–1.00)
GFR, Estimated: 15 mL/min — ABNORMAL LOW
Glucose, Bld: 185 mg/dL — ABNORMAL HIGH (ref 70–99)
Phosphorus: 4.9 mg/dL — ABNORMAL HIGH (ref 2.5–4.6)
Potassium: 3.9 mmol/L (ref 3.5–5.1)
Sodium: 133 mmol/L — ABNORMAL LOW (ref 135–145)

## 2024-02-06 LAB — GLUCOSE, CAPILLARY
Glucose-Capillary: 179 mg/dL — ABNORMAL HIGH (ref 70–99)
Glucose-Capillary: 156 mg/dL — ABNORMAL HIGH (ref 70–99)
Glucose-Capillary: 132 mg/dL — ABNORMAL HIGH (ref 70–99)
Glucose-Capillary: 154 mg/dL — ABNORMAL HIGH (ref 70–99)
Glucose-Capillary: 187 mg/dL — ABNORMAL HIGH (ref 70–99)

## 2024-02-06 MED ORDER — DOXERCALCIFEROL 4 MCG/2ML IV SOLN
INTRAVENOUS | Status: AC
  Filled 2024-02-06: qty 2

## 2024-02-06 MED ORDER — GLUCAGON HCL RDNA (DIAGNOSTIC) 1 MG IJ SOLR
1.0000 mg | Freq: Once | INTRAMUSCULAR | Status: AC | PRN
  Administered 2024-02-06: 1 mg via INTRAVENOUS

## 2024-02-06 MED ORDER — OXYCODONE HCL 5 MG PO TABS: 5.0000 mg | ORAL_TABLET | Freq: Three times a day (TID) | ORAL | 0 refills | Status: AC | PRN

## 2024-02-06 MED ORDER — DOXERCALCIFEROL 4 MCG/2ML IV SOLN
3.0000 ug | INTRAVENOUS | Status: DC
  Administered 2024-02-06: 3 ug via INTRAVENOUS

## 2024-02-06 NOTE — Evaluation (Signed)
 Occupational Therapy Evaluation Patient Details Name: Brittney Pace MRN: [id number] DOB: 05-13-1955 Today's Date: 02/06/2024   History of Present Illness   Pt is a 69 y/o F who presented to [facility] for scheduled VP shunt for NPH on 02/05/24. PMHx: PONV, DM, neuropathy, ESRD, CAD s/p 2vCABG, CHF, CVA, fibromyalgia, GERD, OSA, IBS, anemia.     Clinical Impressions Pt seen for OT evaluation this AM. PTA, pt lived with her husband and was indep with self-care and mobility. Used SPC for community distances, endorses prior balance issues. Today, she was supervision/setup for all self-care (aside from bra management) and supervision/CGA for mobility without AD in hallway. Pt seeking UE support on doors/walls for stability. Limited by abdominal>head pain; left upright in chair. All questions answered and reviewed safe home setup/discharge planning. Pt is functioning at a level that is adequate for d/c from an OT standpoint, no further acute nor follow-up OT needs at this time. Will sign-off.     If plan is discharge home, recommend the following:   Assistance with cooking/housework;Assist for transportation;Help with stairs or ramp for entrance     Functional Status Assessment         Equipment Recommendations   None recommended by OT (pt has adequate DME)     Recommendations for Other Services   PT consult     Precautions/Restrictions   Precautions Precautions: Fall Recall of Precautions/Restrictions: Intact Precaution/Restrictions Comments: abdominal precautions Restrictions Weight Bearing Restrictions Per Provider Order: No     Mobility Bed Mobility Overal bed mobility: Modified Independent             General bed mobility comments: exited to the L, HOB elevated; verbally reviewed log roll technique    Transfers Overall transfer level: Needs assistance   Transfers: Sit to/from Stand, Bed to chair/wheelchair/BSC Sit to Stand: Supervision     Step pivot  transfers: Supervision     General transfer comment: Stood from bed height, reliant on BUE support to push up from mattress.      Balance Overall balance assessment: Needs assistance Sitting-balance support: No upper extremity supported, Feet supported Sitting balance-Leahy Scale: Fair Sitting balance - Comments: seated EOB   Standing balance support: Single extremity supported, During functional activity Standing balance-Leahy Scale: Poor Standing balance comment: reliant on external support - seeking walls/furniture for stability during mobility                           ADL either performed or assessed with clinical judgement   ADL Overall ADL's : Needs assistance/impaired Eating/Feeding: Independent   Grooming: Supervision/safety;Standing           Upper Body Dressing : Minimal assistance Upper Body Dressing Details (indicate cue type and reason): assist to clasp bra behind back, limited by some abdominal discomfort/pain Lower Body Dressing: Set up;Sitting/lateral leans;Sit to/from stand;Cueing for compensatory techniques Lower Body Dressing Details (indicate cue type and reason): demo'd good carryover of compensatory technique for LB ADLs Toilet Transfer: Supervision/safety;Ambulation;Regular Toilet;Rolling walker (2 wheels);Grab bars           Functional mobility during ADLs: Supervision/safety;Rolling walker (2 wheels)       Vision Baseline Vision/History: 1 Wears glasses Ability to See in Adequate Light: 0 Adequate Patient Visual Report: No change from baseline       Perception         Praxis         Pertinent Vitals/Pain Pain Assessment Pain Assessment: 0-10 Pain Score: 5  Pain Location: HA Pain Descriptors / Indicators: Headache Pain Intervention(s): Limited activity within patient's tolerance, Monitored during session, Repositioned     Extremity/Trunk Assessment Upper Extremity Assessment Upper Extremity Assessment: Overall WFL  for tasks assessed   Lower Extremity Assessment Lower Extremity Assessment: Overall WFL for tasks assessed   Cervical / Trunk Assessment Cervical / Trunk Assessment: Normal   Communication Communication Communication: No apparent difficulties (verbose)   Cognition Arousal: Alert Behavior During Therapy: WFL for tasks assessed/performed Cognition: No apparent impairments             OT - Cognition Comments: tangential & talkative                 Following commands: Intact       Cueing  General Comments   Cueing Techniques: Verbal cues;Gestural cues  incision to R posterior head appeared C/D/I   Exercises     Shoulder Instructions      Home Living Family/patient expects to be discharged to:: Private residence Living Arrangements: Spouse/significant other Available Help at Discharge: Family;Available 24 hours/day Type of Home: House Home Access: Stairs to enter [employer] of Steps: 3-5 from garage; 6-7 from front entrance Entrance Stairs-Rails: Right;Left Home Layout: One level     Bathroom Shower/Tub: Producer, Television/film/video: Handicapped height     Home Equipment: Agricultural Consultant (2 wheels);Cane - single point;Shower seat;Grab bars - tub/shower          Prior Functioning/Environment Prior Level of Function : Independent/Modified Independent (husband drives; denies falls hx (although reports being cautious of falling 2/2 balance issues))             Mobility Comments: SPC for community distances, no AD in the house PTA ADLs Comments: indep with BADL, husband assists with light IADLs    OT Problem List: Impaired balance (sitting and/or standing);Decreased activity tolerance;Impaired sensation   OT Treatment/Interventions:        OT Goals(Current goals can be found in the care plan section)   Acute Rehab OT Goals Patient Stated Goal: go home and get better   OT Frequency:       Co-evaluation               AM-PAC OT 6 Clicks Daily Activity     Outcome Measure Help from another person eating meals?: None Help from another person taking care of personal grooming?: None Help from another person toileting, which includes using toliet, bedpan, or urinal?: None Help from another person bathing (including washing, rinsing, drying)?: A Little Help from another person to put on and taking off regular upper body clothing?: None Help from another person to put on and taking off regular lower body clothing?: None 6 Click Score: 23   End of Session Equipment Utilized During Treatment: Gait belt Nurse Communication: Mobility status  Activity Tolerance: Patient tolerated treatment well Patient left: in chair;with call bell/phone within reach  OT Visit Diagnosis: Unsteadiness on feet (R26.81);Other abnormalities of gait and mobility (R26.89)                Time: 9164-9090 OT Time Calculation (min): 34 min Charges:  OT General Charges $OT Visit: 1 Visit OT Evaluation $OT Eval Low Complexity: 1 Low OT Treatments $Self Care/Home Management : 8-22 mins  Zully Frane M. [place], OTR/L [facility] Acute Rehabilitation Services [phone] Secure Chat Preferred  Con Arganbright 02/06/2024, 9:46 AM

## 2024-02-06 NOTE — Discharge Instructions (Signed)

## 2024-02-06 NOTE — Consult Note (Signed)
 ESRD Consult Note  Reason for consult: ESRD, provision of dialysis  Assessment/Recommendations:  ESRD -outpatient HD orders: [facility] [place].  TTS.  F180.  4 hours.  EDW 46.9 kg.  AVG 16-gauge.  Flow rates: 350/800.  Heparin : None Meds: Hectorol  3 mcg every treatment -HD today per TTS schedule -discussed with renal navigator, will try to get her set up with her OP HD unit to see if we can avoid HD on day of discharge  Hydrocephalus -per primary service, s/p right occipital VP shunt on 02/05/24  Volume -UF as tolerated  Anemia of Chronic Kidney Disease -Hemoglobin 12.2, at goal -Transfuse PRN for Hgb <7  Secondary Hyperparathyroidism/Hyperphosphatemia - resuming hectorol , checking phos  DM1 -mgmt per primary service  Ephriam Stank, MD [facility]  History of Present Illness: Brittney Pace is a/an 69 y.o. female with a past medical history of ESRD, DM 1, hydrocephalus who presents VP shunt.  Has been having unsteadiness and gait abnormalities, found to have hydrocephalus.  She underwent right occipital VP shunt on 02/05/2024 with neurosurgery. Patient seen in room. She reports some post op pain but otherwise okay. No other complaints. Currently working with therapy.  Medications:  Current Facility-Administered Medications  Medication Dose Route Frequency Provider Last Rate Last Admin   0.9 %  sodium chloride  infusion  250 mL Intravenous Continuous Louis Shove, MD   Stopped at 02/05/24 1126   acetaminophen  (TYLENOL ) tablet 650 mg  650 mg Oral Q4H PRN Louis Shove, MD       Or   acetaminophen  (TYLENOL ) suppository 650 mg  650 mg Rectal Q4H PRN Louis Shove, MD       carvedilol  (COREG ) tablet 12.5 mg  12.5 mg Oral QHS Louis Shove, MD       Chlorhexidine  Gluconate Cloth 2 % PADS 6 each  6 each Topical Q0600 Macel Jayson PARAS, MD   6 each at 02/06/24 0555   cyclobenzaprine  (FLEXERIL ) tablet 10 mg  10 mg Oral TID PRN Louis Shove, MD   10 mg at 02/05/24 1411    diphenoxylate -atropine  (LOMOTIL ) 2.5-0.025 MG per tablet 1 tablet  1 tablet Oral Daily PRN Louis Shove, MD       HYDROmorphone  (DILAUDID ) injection 1 mg  1 mg Intravenous Q2H PRN Louis Shove, MD       hydrOXYzine  (ATARAX ) tablet 10 mg  10 mg Oral Q6H PRN Louis Shove, MD       insulin  pump   Subcutaneous TID WC, HS, 0200 Louis Shove, MD   0.4 each at 02/06/24 0205   menthol  (CEPACOL) lozenge 3 mg  1 lozenge Oral PRN Louis Shove, MD       Or   phenol (CHLORASEPTIC) mouth spray 1 spray  1 spray Mouth/Throat PRN Louis Shove, MD       multivitamin (RENA-VIT) tablet 1 tablet  1 tablet Oral QHS Pool, Henry, MD   1 tablet at 02/05/24 2128   ondansetron  (ZOFRAN ) tablet 4 mg  4 mg Oral Q6H PRN Louis Shove, MD       Or   ondansetron  (ZOFRAN ) injection 4 mg  4 mg Intravenous Q6H PRN Louis Shove, MD   4 mg at 02/06/24 0716   oxyCODONE  (Oxy IR/ROXICODONE ) immediate release tablet 5-10 mg  5-10 mg Oral Q4H PRN Louis Shove, MD   5 mg at 02/06/24 0541   pantoprazole  (PROTONIX ) EC tablet 40 mg  40 mg Oral Daily Louis Shove, MD   40 mg at 02/05/24 1405   sodium chloride  flush (  NS) 0.9 % injection 3 mL  3 mL Intravenous Q12H Louis Shove, MD   3 mL at 02/05/24 2223   sodium chloride  flush (NS) 0.9 % injection 3 mL  3 mL Intravenous PRN Louis Shove, MD       thyroid  [facility]) tablet 60 mg  60 mg Oral Once per day on Sunday Monday Wednesday Friday Louis Shove, MD       Facility-Administered Medications Ordered in Other Encounters  Medication Dose Route Frequency Provider Last Rate Last Admin   Darbepoetin Alfa  (ARANESP ) injection 300 mcg  300 mcg Subcutaneous Once Ennever, Peter R, MD         ALLERGIES Morphine , Penicillins, Tramadol, Acetaminophen , Amitriptyline, Codeine, Ibuprofen, Krill oil, Lactose intolerance (gi), Losartan, Propoxyphene, Shellfish allergy, Statins, Sulfamethoxazole, Sulfites, Ace inhibitors, Fluconazole, and Norvasc  [amlodipine  besylate]  MEDICAL HISTORY Past Medical History:  Diagnosis  Date   Anemia    Arthritis    Babesiasis    secondary due to lyme disease   CHF (congestive heart failure) (HCC)    Chronic kidney disease    stage 3   Complication of anesthesia    Coronary artery disease    Depression    Diabetes mellitus without complication (HCC)    Type 1   Diabetic retinopathy (HCC)    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   Fibromyalgia    Gastroparesis    GERD (gastroesophageal reflux disease)    Headache    migraines   Hypothyroidism    IBS (irritable bowel syndrome)    Idiopathic edema    Iron deficiency anemia 09/04/2019   Lyme disease    Mitral valve prolapse    Myocardial infarction (HCC)    1 major in 1999 and 2 minor  small vessel disease.   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  the second stroke was a lacunar      SOCIAL HISTORY Social History   Socioeconomic History   Marital status: Married    Spouse name: Not on file   Number of children: 0   Years of education: college   Highest education level: Not on file  Occupational History   Occupation: Retired  Tobacco Use   Smoking status: Former    Current packs/day: 1.00    Average packs/day: 1 pack/day for 51.0 years (51.0 ttl pk-yrs)    Types: Cigarettes    Start date: 1975   Smokeless tobacco: Never   Tobacco comments:      Quit smoking cigarettes in 20's   Vaping Use   Vaping status: Never Used  Substance and Sexual Activity   Alcohol use: Yes    Alcohol/week: 7.0 standard drinks of alcohol    Types: 7 Cans of beer per week     Comment: beer nightly   Drug use: No   Sexual activity: Not Currently  Other Topics Concern   Not on file  Social History Narrative   Drinks 1-2  cups caffeine drinks a day  Right handed   Lives at home with husband and dog   Social Drivers of Health   Tobacco Use: Medium Risk (02/05/2024)   Patient History    Smoking Tobacco Use: Former    Smokeless Tobacco Use: Never    Passive Exposure: Not on file  Financial Resource Strain: Not on file  Food Insecurity: Unknown (12/29/2023)   Received from [facility]   Epic    Within the past 12 months, you worried that your food would run out before you got money to buy more: Patient declined to answer    Within the past 12 months, the food you bought just didn't last and you didn't have money to get more. : Patient declined to answer  Transportation Needs: Not on file (12/29/2023)  Physical Activity: Not on file  Stress: Not on file  Social Connections: Socially Integrated (10/15/2023)   Social Connection and Isolation Panel    Frequency of Communication with Friends and Family: Three times a week    Frequency of Social Gatherings with Friends and Family: Once a week    Attends Religious Services: 1 to 4 times per year    Active Member of [employer] or Organizations: Yes    Attends Banker Meetings: Never    Marital Status: Married  Catering Manager Violence: Not At Risk (10/15/2023)   Epic    Fear of Current or Ex-Partner: No    Emotionally Abused: No    Physically Abused: No    Sexually Abused: No  Depression (PHQ2-9): Low Risk (11/27/2023)   Depression (PHQ2-9)    PHQ-2 Score: 1  Alcohol Screen: Not on file  Housing: Not on file (12/29/2023)  Utilities: Unknown (12/29/2023)   Received from [facility]   Utilities    In the past 12 months has the electric, gas, oil, or water company threatened to shut off services in your home? : Patient declined to answer  Health Literacy: Not on file     FAMILY HISTORY Family  History  Problem Relation Age of Onset   Breast cancer Mother    Migraines Mother    Heart disease Father    Hypertension Father    Breast cancer Sister      Review of Systems: 12 systems were reviewed and negative except per HPI  Physical Exam: Vitals:   02/06/24 0338 02/06/24 0732  BP: (!) 139/51 (!) 188/84  Pulse: 60 (!) 58  Resp: 18 16  Temp: 98.6 F (37 C) 98 F (36.7 C)  SpO2: 97% 99%   No intake/output data recorded.  Intake/Output Summary (Last 24 hours) at 02/06/2024 0842 Last data filed at 02/05/2024 1135 Gross per 24 hour  Intake 350 ml  Output --  Net 350 ml   General: well-appearing, no acute distress HEENT: anicteric sclera, MMM CV: normal rate, no murmurs, no edema Lungs: bilateral chest rise, normal wob Abd: soft, non-tender, non-distended Skin: no visible lesions or rashes Psych: alert, engaged, appropriate mood and affect Neuro: normal speech, no gross focal deficits  Dialysis access: LUE AVG +b/t  Test Results Reviewed Lab Results  Component Value Date   NA 132 (L) 02/05/2024   K 5.4 (H) 02/05/2024   CL 101 02/05/2024   CO2 28 02/02/2024   BUN 54 (H) 02/05/2024   CREATININE 3.10 (H) 02/05/2024   GFR 43.83 (L) 06/19/2020   CALCIUM  9.2 02/02/2024   ALBUMIN 4.1 01/22/2024   PHOS 4.8 (H) 10/17/2023    I have reviewed relevant outside healthcare records

## 2024-02-06 NOTE — Progress Notes (Addendum)
 Pt receives out-pt HD at [facility] on TTS 11:30 am chair time. Advised by staff of plan for pt to d/c today. Contacted nephrologist and renal NP to inquire if pt would be appropriate for out-pt HD later today or tomorrow. Nephrology staff felt pt is appropriate for either. Contacted pt's clinic and advised that pt could be treated today if arrives by 12:00 but no appt available for tomorrow. Spoke to pt via phone. Pt states that she feels that she cannot physically make it to clinic today. Pt advised that plan will be for pt to receive HD here then but exact time of treatment is unknown. Pt voices understanding. Update provided to nephrologist, renal NP, and inpt HD unit staff. Will contact pt's clinic to be advised that pt will d/c today and that pt will receive treatment here prior to d/c.    Randine Mungo Dialysis Navigator [phone]

## 2024-02-06 NOTE — Progress Notes (Signed)
 Received patient from hemodialysis. A&Ox4. VS stable. Patient very anxious to go home, husband at bedside.. Complaining of some pain on surgical site, PRN pain meds given. D/C instructions/education/AVS given to patient with husband at bedside and they both verbalize understanding. F/U check up already scheduled in two weeks. No drainge, no swelling, no redness on incision site. Patient ambulating with assist. D/C via wheelchair.

## 2024-02-06 NOTE — Discharge Summary (Signed)
 Physician Discharge Summary  Patient ID: Brittney Pace MRN: [id number] DOB/AGE: July 13, 1955 69 y.o.  Admit date: 02/05/2024 Discharge date: 02/06/2024  Admission Diagnoses:  Discharge Diagnoses:  Principal Problem:   Communicating hydrocephalus [facility]   Discharged Condition: good  Hospital Course: Patient admitted to the hospital where she underwent uncomplicated right occipital VP shunt for treatment of chronic communicating hydrocephalus.  Patient has done well postoperatively with some incisional pain but no other complaints.  Patient standing and ambulating without difficulty.  Plan for her to get dialysis before discharge home.  Consults:   Significant Diagnostic Studies:   Treatments:   Discharge Exam: Blood pressure (!) 188/84, pulse (!) 58, temperature 98 F (36.7 C), temperature source Oral, resp. rate 16, height 4' 10 (1.473 m), weight 47.6 kg, SpO2 99%. Awake and alert.  Oriented and appropriate.  Motor/function intact.  Wound clean and dry.  Chest and abdomen benign.  Disposition: Discharge disposition: 01-Home or Self Care        Allergies as of 02/06/2024       Reactions   Morphine  Anaphylaxis, Shortness Of Breath   Penicillins Hives   Tolerated ANCEF  on 04/30/20   Tramadol Anaphylaxis   Acetaminophen  Other (See Comments)   Alters insulin  pump readings   Amitriptyline Other (See Comments)   Severe headache/ out of body feeling   Codeine Other (See Comments)   Severe headaches/ out of body feeling   Ibuprofen Other (See Comments)   Messes up CGM reading on glucose monitor   Krill Oil Diarrhea, Itching, Nausea And Vomiting   Lactose Intolerance (gi) Diarrhea   Losartan Cough   Propoxyphene Other (See Comments)   Severe headaches / out of body feeling   Shellfish Allergy Diarrhea, Nausea And Vomiting   Statins Other (See Comments)   Muscle pains Other reaction(s): Other   Sulfamethoxazole Other (See Comments)   Mouth ulcers   Sulfites Itching,  Other (See Comments)   Mouth ulcers   Ace Inhibitors Other (See Comments)   Other Reaction(s): Unknown   Fluconazole    Other reaction(s): Contact Dermatitis (intolerance)   Norvasc  [amlodipine  Besylate] Swelling        Medication List     TAKE these medications    ALPHA-LIPOIC ACID PO Take 1 capsule by mouth daily.   ARANESP  IJ Inject as directed.   Armour Thyroid  60 MG tablet Generic drug: thyroid  Take 60 mg by mouth See admin instructions. Take 60 mg by mouth once daily on Sunday, Monday, Wednesday, and Friday.   aspirin  EC 325 MG tablet Take 1 tablet (325 mg total) by mouth daily.   b complex-vitamin c -folic acid  0.8 MG Tabs tablet Take 1 tablet by mouth daily.   carvedilol  12.5 MG tablet Commonly known as: COREG  TAKE 1 TABLET BY MOUTH TWICE DAILY WITH A MEAL What changed:  when to take this additional instructions   cetirizine 10 MG tablet Commonly known as: ZYRTEC Take 10 mg by mouth at bedtime.   clobetasol  0.05 % external solution Commonly known as: TEMOVATE  Apply 1 Application topically 2 (two) times daily.   clopidogrel  75 MG tablet Commonly known as: Plavix  Take 1 tablet (75 mg total) by mouth daily.   dexamethasone  0.1 % ophthalmic solution Commonly known as: DECADRON  Place 1 drop into the left eye daily as needed (Left ear daily.).   diclofenac  Sodium 1 % Gel Commonly known as: VOLTAREN  Apply 1 Application topically daily as needed (pain).   diphenoxylate -atropine  2.5-0.025 MG tablet Commonly known as: LOMOTIL  TAKE 1  TABLET BY MOUTH 4 TIMES DAILY AS NEEDED FOR DIARRHEA OR  LOOSE  STOOLS What changed: See the new instructions.   esomeprazole 20 MG capsule Commonly known as: NEXIUM Take 20 mg by mouth daily.   fluocinonide  0.05 % external solution Commonly known as: LIDEX  Apply 1 Application topically daily. What changed: when to take this   hydrOXYzine  10 MG tablet Commonly known as: ATARAX  Take 1 tablet (10 mg total) by mouth  every 6 (six) hours as needed for itching.   ketoconazole  2 % shampoo Commonly known as: NIZORAL  Apply 1 Application topically 2 (two) times a week.   ketorolac  0.5 % ophthalmic solution Commonly known as: ACULAR  Place 1 drop into both eyes 2 (two) times daily.   lidocaine -prilocaine  cream Commonly known as: EMLA  Apply 1 Application topically 3 (three) times a week.   Melatonin Gummies 2.5 MG Chew Chew 2.5 mg by mouth at bedtime as needed (sleep).   nitroGLYCERIN  0.4 MG/SPRAY spray Commonly known as: NITROLINGUAL  Place 1 spray under the tongue every 5 (five) minutes x 3 doses as needed. What changed: reasons to take this   NovoLOG  100 UNIT/ML injection Generic drug: insulin  aspart Inject 0-10 Units into the skin See admin instructions. VIA Insulin  pump   OVER THE COUNTER MEDICATION Take 1 capsule by mouth at bedtime. Conjugated linoleic acid   OVER THE COUNTER MEDICATION Take 3 tablets by mouth at bedtime. Magnesium  Threonate   oxyCODONE  5 MG immediate release tablet Commonly known as: Roxicodone  Take 1 tablet (5 mg total) by mouth every 8 (eight) hours as needed.   prednisoLONE  acetate 1 % ophthalmic suspension Commonly known as: PRED FORTE  Place 1 drop into both eyes 2 (two) times daily.   Premarin  vaginal cream Generic drug: conjugated estrogens  Place 1 applicator vaginally daily as needed (irritation).   torsemide  20 MG tablet Commonly known as: [name]  Take 2 tablets (40 mg total) by mouth daily. What changed:  how much to take when to take this reasons to take this   valACYclovir  500 MG tablet Commonly known as: VALTREX  Take 500 mg by mouth daily as needed (Flair up).         Signed: [facility] Brittney Pace 02/06/2024, 10:26 AM

## 2024-02-06 NOTE — Evaluation (Signed)
 Physical Therapy Brief Evaluation and Discharge Note Patient Details Name: Brittney Pace MRN: [id number] DOB: [birth year] Today's Date: 02/06/2024   History of Present Illness  Pt is a 69 y/o F who presented to [facility] for scheduled VP shunt for chronic communicating hydrocephalus on 02/05/24. PMHx: PONV, DM, neuropathy, ESRD, CAD s/p 2vCABG, CHF, CVA, fibromyalgia, GERD, OSA, IBS, anemia.   Clinical Impression  Patient is s/p above surgery resulting in functional limitations due to the deficits listed below (see PT Problem List). Mod I prior to admission, husband assists with IADLs. Pt with some prior balance deficits per notes, she does not feel this has changed post-op. Used SPC to ambulate and navigate stairs similar to home set-up. Required supervision for safety but is able to self correct instability with appropriate use of cane. Educated on safety, awareness, and OPPT follow-up recommendations. All questions answered. No further acute PT needs identified. All education has been completed and the patient has support at home. PT is signing off. Thank you for this referral.         PT Assessment All further PT needs can be met in the next venue of care  Assistance Needed at Discharge  Set up Supervision/Assistance    Equipment Recommendations None recommended by PT  Recommendations for Other Services       Precautions/Restrictions Precautions Precautions: Fall Recall of Precautions/Restrictions: Intact Precaution/Restrictions Comments: abdominal precautions Restrictions Weight Bearing Restrictions Per Provider Order: No        Mobility  Bed Mobility       General bed mobility comments: in recliner after seeing OT before my visit.  Transfers Overall transfer level: Modified independent Equipment used: Straight cane Transfers: Sit to/from Stand, Bed to chair/wheelchair/BSC Sit to Stand: Modified independent (Device/Increase time)           General transfer comment:  Mod I to stand and stabilize with ues of SPC for light support. no physical assist needed. Good control with descent into chair as well.    Ambulation/Gait Ambulation/Gait assistance: Supervision Gait Distance (Feet): 80 Feet Assistive device: Straight cane Gait Pattern/deviations: Step-through pattern, Decreased stride length, Drifts right/left Gait Speed: Below normal General Gait Details: Mild instability noted but adequately supported by [facility]. Pt able to self correct without overt LOB or buckling. Educated on awareness, findings, and safety with  mobility using this device.  Home Activity Instructions Home Activity Instructions: Avoid heavy lifting. Ambulate regulary with assistive device, supervised by family.  Stairs Stairs: Yes Stairs assistance: Supervision Stair Management: One rail Left, Step to pattern, Forwards, With cane Number of Stairs: 6 General stair comments: Educated on sequencing with single rail and SPC similar to home set-up. Step-to pattern for increased control and safety. No buckling. Feels confident navigating. No physical assist, supervision for safety.  Modified Rankin (Stroke Patients Only)        Balance Overall balance assessment: Mild deficits observed, not formally tested Sitting-balance support: No upper extremity supported, Feet supported Sitting balance-Leahy Scale: Good     Standing balance support: During functional activity, No upper extremity supported Standing balance-Leahy Scale: Fair Standing balance comment: More steady with SPC for support.          Pertinent Vitals/Pain   Pain Assessment Pain Assessment: 0-10 Pain Score: 5  Pain Location: HA Pain Descriptors / Indicators: Headache Pain Intervention(s): Limited activity within patient's tolerance, Monitored during session     Home Living Family/patient expects to be discharged to:: Private residence Living Arrangements: Spouse/significant other Available Help at  Discharge:  Family;Available 24 hours/day Home Environment: Stairs to enter;Rail - right;Rail - left  Stairs-Number of Steps: 5-7 Home Equipment: Rolling Walker (2 wheels);Cane - single point;Shower seat;Grab bars - tub/shower        Prior Function Level of Independence: Independent with assistive device(s) Comments: SPC for community distances, no AD in the house PTA. Denies falls. Husband assists with IADLs.    UE/LE Assessment        LE ROM/Strength/Tone/Coordination: Generalized weakness      Communication   Communication Communication: No apparent difficulties (verbose)     Cognition Overall Cognitive Status: No family/caregiver present to determine baseline cognitive functioning       General Comments General comments (skin integrity, edema, etc.): incision to R posterior head appeared C/D/I    Exercises     Assessment/Plan    PT Problem List Decreased balance;Decreased mobility;Decreased cognition;Decreased coordination       PT Visit Diagnosis Unsteadiness on feet (R26.81);Other abnormalities of gait and mobility (R26.89);Other symptoms and signs involving the nervous system (R29.898)    No Skilled PT     Co-evaluation                AMPAC 6 Clicks Help needed turning from your back to your side while in a flat bed without using bedrails?: None Help needed moving from lying on your back to sitting on the side of a flat bed without using bedrails?: None Help needed moving to and from a bed to a chair (including a wheelchair)?: None Help needed standing up from a chair using your arms (e.g., wheelchair or bedside chair)?: None Help needed to walk in hospital room?: A Little Help needed climbing 3-5 steps with a railing? : A Little 6 Click Score: 22      End of Session Equipment Utilized During Treatment: Gait belt Activity Tolerance: Patient tolerated treatment well Patient left: in chair;with call bell/phone within reach Nurse Communication:  Mobility status PT Visit Diagnosis: Unsteadiness on feet (R26.81);Other abnormalities of gait and mobility (R26.89);Other symptoms and signs involving the nervous system (R29.898)     Time: 9085-9077 PT Time Calculation (min) (ACUTE ONLY): 8 min  Charges:   PT Evaluation $PT Eval Low Complexity: 1 Low      Leontine Roads, PT, DPT [facility]  Rehabilitation Services Physical Therapist Office: [phone] Website: [email or web address]   Leontine GORMAN Roads  02/06/2024, 11:45 AM

## 2024-02-06 NOTE — Procedures (Signed)
 HD Note:  Some information was entered later than the data was gathered due to patient care needs. The stated time with the data is accurate.  Received patient in bed to unit.   Alert and oriented.   Informed consent signed and in chart.   Access used: Upper left arm graft Access issues: Arterial pressures raised, BFR reduced to 350  Patient notified by blood glucose monitor of hypoglycemia.  Patient reported that the monitor was reading 70.  Apple juice and patient's glucose tablets were consumed.  Patient's personal monitor continued to trend down.  Dr. LULLA Stank notified with new orders for glucagon  1mg  IV.  Medication given, see MAR. Patient's personal monitor continued to show a trend down.  CBG done with hospital equipment with different results.  These results were in a safe zone, see results information. Patient exhibited difficulty with processing when first alerted by the monitor.  After glucagon  administration, patient stated that she felt very tired.  Patient's glucose monitor measured 170 at the end of treatment.   TX duration: 3.5 hours  Total UF removed: 2000 ml  Hand-off given to patient's nurse.   Transported back to the room   Zakir Henner L. Lenon, RN [facility].

## 2024-02-07 NOTE — TOC Transition Note (Signed)
 Transition of Care - Initial Contact after Hospitalization  Date of discharge: 02/06/2024  Date of contact: 02/07/2024  Method: Phone Spoke to: Patient's Husband  Patient's husband contacted to discuss transition of care from recent inpatient hospitalization. Patient was admitted to [facility] from 02/05/24 to 02/06/24 discharge diagnosis of Hydrocephalus - S/p  uncomplicated right occipital VP shunt.  The discharge medication list was reviewed. Patient's husband understands the changes and has no concerns.   Patient's husband informed me patient cancelled HD tomorrow because she still felt weak. I advised to watch her fluids and foods. She plans to go to HD on Saturday.   Charmaine Piety, NP

## 2024-02-07 NOTE — Discharge Planning (Signed)
 [facility] Patient Discharge Orders- [facility] CLINIC: [place]  Patient's name: Kilee Hedding Admit/DC Dates: 02/05/2024 - 02/06/2024  Discharge Diagnoses: Hydrocephalus - S/p  uncomplicated right occipital VP shunt   Aranesp : Given: No    Last Hgb: 12.2 PRBC's Given: No ESA dose for discharge: N/A IV Iron dose at discharge: N/A  Heparin  change: N/A  EDW Change: No  Bath Change: No  Access intervention/Change: No  Hectorol  change: No  Discharge Labs:  Calcium  8.8  Phosphorus 4.9  Albumin 3.6  K+ 3.9  IV Antibiotics: No  On Coumadin?: No   OTHER/APPTS/LAB ORDERS:    D/C Meds to be reconciled by nurse after every discharge.  Completed By: Charmaine Piety, NP   Reviewed by: MD:______ RN_______

## 2024-02-09 ENCOUNTER — Other Ambulatory Visit: Payer: Self-pay | Admitting: *Deleted

## 2024-02-09 DIAGNOSIS — D631 Anemia in chronic kidney disease: Secondary | ICD-10-CM

## 2024-02-09 DIAGNOSIS — E1059 Type 1 diabetes mellitus with other circulatory complications: Secondary | ICD-10-CM

## 2024-02-09 DIAGNOSIS — D509 Iron deficiency anemia, unspecified: Secondary | ICD-10-CM

## 2024-02-09 DIAGNOSIS — N1832 Chronic kidney disease, stage 3b: Secondary | ICD-10-CM

## 2024-02-09 DIAGNOSIS — E639 Nutritional deficiency, unspecified: Secondary | ICD-10-CM

## 2024-02-09 DIAGNOSIS — D500 Iron deficiency anemia secondary to blood loss (chronic): Secondary | ICD-10-CM

## 2024-02-09 DIAGNOSIS — I2510 Atherosclerotic heart disease of native coronary artery without angina pectoris: Secondary | ICD-10-CM

## 2024-02-09 DIAGNOSIS — R9089 Other abnormal findings on diagnostic imaging of central nervous system: Secondary | ICD-10-CM

## 2024-02-09 DIAGNOSIS — R270 Ataxia, unspecified: Secondary | ICD-10-CM

## 2024-02-09 DIAGNOSIS — R519 Headache, unspecified: Secondary | ICD-10-CM

## 2024-02-09 DIAGNOSIS — H539 Unspecified visual disturbance: Secondary | ICD-10-CM

## 2024-02-09 DIAGNOSIS — R300 Dysuria: Secondary | ICD-10-CM

## 2024-02-09 DIAGNOSIS — E1022 Type 1 diabetes mellitus with diabetic chronic kidney disease: Secondary | ICD-10-CM

## 2024-02-09 DIAGNOSIS — D508 Other iron deficiency anemias: Secondary | ICD-10-CM

## 2024-02-09 DIAGNOSIS — D638 Anemia in other chronic diseases classified elsewhere: Secondary | ICD-10-CM

## 2024-02-09 DIAGNOSIS — R5383 Other fatigue: Secondary | ICD-10-CM

## 2024-02-09 DIAGNOSIS — E039 Hypothyroidism, unspecified: Secondary | ICD-10-CM

## 2024-02-12 ENCOUNTER — Inpatient Hospital Stay: Admitting: Hematology & Oncology

## 2024-02-12 ENCOUNTER — Inpatient Hospital Stay

## 2024-02-16 ENCOUNTER — Ambulatory Visit (HOSPITAL_BASED_OUTPATIENT_CLINIC_OR_DEPARTMENT_OTHER)
Admission: RE | Admit: 2024-02-16 | Discharge: 2024-02-16 | Disposition: A | Discharge status: Home / Self Care | Source: Ambulatory Visit | Attending: Hematology & Oncology | Admitting: Hematology & Oncology

## 2024-02-16 ENCOUNTER — Encounter: Payer: Self-pay | Admitting: Hematology & Oncology

## 2024-02-16 ENCOUNTER — Inpatient Hospital Stay: Attending: Hematology & Oncology

## 2024-02-16 ENCOUNTER — Ambulatory Visit: Payer: Self-pay | Admitting: Hematology & Oncology

## 2024-02-16 ENCOUNTER — Inpatient Hospital Stay: Admitting: Hematology & Oncology

## 2024-02-16 ENCOUNTER — Inpatient Hospital Stay

## 2024-02-16 VITALS — BP 149/57 | HR 65 | Temp 98.5°F | Resp 18 | Wt 107.1 lb

## 2024-02-16 DIAGNOSIS — Z8673 Personal history of transient ischemic attack (TIA), and cerebral infarction without residual deficits: Secondary | ICD-10-CM | POA: Insufficient documentation

## 2024-02-16 DIAGNOSIS — D638 Anemia in other chronic diseases classified elsewhere: Secondary | ICD-10-CM

## 2024-02-16 DIAGNOSIS — E039 Hypothyroidism, unspecified: Secondary | ICD-10-CM

## 2024-02-16 DIAGNOSIS — Z79899 Other long term (current) drug therapy: Secondary | ICD-10-CM | POA: Diagnosis not present

## 2024-02-16 DIAGNOSIS — D508 Other iron deficiency anemias: Secondary | ICD-10-CM

## 2024-02-16 DIAGNOSIS — D631 Anemia in chronic kidney disease: Secondary | ICD-10-CM

## 2024-02-16 DIAGNOSIS — E1022 Type 1 diabetes mellitus with diabetic chronic kidney disease: Secondary | ICD-10-CM | POA: Insufficient documentation

## 2024-02-16 DIAGNOSIS — R1033 Periumbilical pain: Secondary | ICD-10-CM | POA: Insufficient documentation

## 2024-02-16 DIAGNOSIS — N1831 Chronic kidney disease, stage 3a: Secondary | ICD-10-CM

## 2024-02-16 DIAGNOSIS — N183 Chronic kidney disease, stage 3 unspecified: Secondary | ICD-10-CM

## 2024-02-16 DIAGNOSIS — D509 Iron deficiency anemia, unspecified: Secondary | ICD-10-CM

## 2024-02-16 DIAGNOSIS — Z992 Dependence on renal dialysis: Secondary | ICD-10-CM | POA: Diagnosis not present

## 2024-02-16 DIAGNOSIS — R519 Headache, unspecified: Secondary | ICD-10-CM

## 2024-02-16 DIAGNOSIS — R5383 Other fatigue: Secondary | ICD-10-CM

## 2024-02-16 DIAGNOSIS — I2510 Atherosclerotic heart disease of native coronary artery without angina pectoris: Secondary | ICD-10-CM

## 2024-02-16 DIAGNOSIS — Z794 Long term (current) use of insulin: Secondary | ICD-10-CM | POA: Insufficient documentation

## 2024-02-16 DIAGNOSIS — D500 Iron deficiency anemia secondary to blood loss (chronic): Secondary | ICD-10-CM

## 2024-02-16 DIAGNOSIS — I120 Hypertensive chronic kidney disease with stage 5 chronic kidney disease or end stage renal disease: Secondary | ICD-10-CM | POA: Diagnosis present

## 2024-02-16 DIAGNOSIS — R270 Ataxia, unspecified: Secondary | ICD-10-CM

## 2024-02-16 DIAGNOSIS — N186 End stage renal disease: Secondary | ICD-10-CM | POA: Insufficient documentation

## 2024-02-16 DIAGNOSIS — E1059 Type 1 diabetes mellitus with other circulatory complications: Secondary | ICD-10-CM

## 2024-02-16 DIAGNOSIS — H539 Unspecified visual disturbance: Secondary | ICD-10-CM

## 2024-02-16 DIAGNOSIS — E639 Nutritional deficiency, unspecified: Secondary | ICD-10-CM

## 2024-02-16 DIAGNOSIS — R300 Dysuria: Secondary | ICD-10-CM

## 2024-02-16 DIAGNOSIS — R9089 Other abnormal findings on diagnostic imaging of central nervous system: Secondary | ICD-10-CM

## 2024-02-16 LAB — CMP (CANCER CENTER ONLY)
ALT: <5 U/L (ref 0–44)
AST: 20 U/L (ref 15–41)
Albumin: 3.7 g/dL (ref 3.5–5.0)
Alkaline Phosphatase: 88 U/L (ref 38–126)
Anion gap: 9 (ref 5–15)
BUN: 15 mg/dL (ref 8–23)
CO2: 33 mmol/L — ABNORMAL HIGH (ref 22–32)
Calcium: 9 mg/dL (ref 8.9–10.3)
Chloride: 91 mmol/L — ABNORMAL LOW (ref 98–111)
Creatinine: 2.21 mg/dL — ABNORMAL HIGH (ref 0.44–1.00)
GFR, Estimated: 23 mL/min — ABNORMAL LOW
Glucose, Bld: 161 mg/dL — ABNORMAL HIGH (ref 70–99)
Potassium: 4 mmol/L (ref 3.5–5.1)
Sodium: 133 mmol/L — ABNORMAL LOW (ref 135–145)
Total Bilirubin: 0.3 mg/dL (ref 0.0–1.2)
Total Protein: 6.1 g/dL — ABNORMAL LOW (ref 6.5–8.1)

## 2024-02-16 LAB — CBC WITH DIFFERENTIAL (CANCER CENTER ONLY)
Abs Immature Granulocytes: 0.02 K/uL (ref 0.00–0.07)
Basophils Absolute: 0.1 K/uL (ref 0.0–0.1)
Basophils Relative: 1 %
Eosinophils Absolute: 0.4 K/uL (ref 0.0–0.5)
Eosinophils Relative: 6 %
HCT: 32.3 % — ABNORMAL LOW (ref 36.0–46.0)
Hemoglobin: 10.3 g/dL — ABNORMAL LOW (ref 12.0–15.0)
Immature Granulocytes: 0 %
Lymphocytes Relative: 17 %
Lymphs Abs: 1.2 K/uL (ref 0.7–4.0)
MCH: 27.6 pg (ref 26.0–34.0)
MCHC: 31.9 g/dL (ref 30.0–36.0)
MCV: 86.6 fL (ref 80.0–100.0)
Monocytes Absolute: 1 K/uL (ref 0.1–1.0)
Monocytes Relative: 14 %
Neutro Abs: 4.5 K/uL (ref 1.7–7.7)
Neutrophils Relative %: 62 %
Platelet Count: 383 K/uL (ref 150–400)
RBC: 3.73 MIL/uL — ABNORMAL LOW (ref 3.87–5.11)
RDW: 14.4 % (ref 11.5–15.5)
WBC Count: 7.2 K/uL (ref 4.0–10.5)
nRBC: 0 % (ref 0.0–0.2)

## 2024-02-16 LAB — RETICULOCYTES
Immature Retic Fract: 7.1 % (ref 2.3–15.9)
RBC.: 3.7 MIL/uL — ABNORMAL LOW (ref 3.87–5.11)
Retic Count, Absolute: 26.3 K/uL (ref 19.0–186.0)
Retic Ct Pct: 0.7 % (ref 0.4–3.1)

## 2024-02-16 LAB — FERRITIN: Ferritin: 106 ng/mL (ref 11–307)

## 2024-02-16 LAB — IRON AND IRON BINDING CAPACITY (CC-WL,HP ONLY)
Iron: 46 ug/dL (ref 28–170)
Saturation Ratios: 16 % (ref 10.4–31.8)
TIBC: 283 ug/dL (ref 250–450)
UIBC: 237 ug/dL

## 2024-02-16 MED ORDER — DARBEPOETIN ALFA 300 MCG/0.6ML IJ SOSY
300.0000 ug | PREFILLED_SYRINGE | Freq: Once | INTRAMUSCULAR | Status: AC
  Administered 2024-02-16: 300 ug via SUBCUTANEOUS
  Filled 2024-02-16: qty 0.6

## 2024-02-16 NOTE — Telephone Encounter (Signed)
-----   Message from Maude Crease, MD sent at 02/16/2024  4:09 PM EST ----- Please call her and let her know that the abdominal x-ray does not show any evidence of bowel obstruction.  She has a lot of stool in the colon so she needs to work on getting that moved out of her.   Thanks.  Brittney Pace

## 2024-02-16 NOTE — Patient Instructions (Signed)

## 2024-02-16 NOTE — Progress Notes (Signed)
 " Hematology and Oncology Follow Up Visit  Brittney Pace [id number] 05/16/1955 69 y.o. 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  She still does not feel all that great.  She has surgery there couple weeks ago.  She had a VP shunt put in.  She has some headaches.  She has had abdominal pain.  She has some pain in her shoulders.  Again, a lot of this is postoperative.  I think she sees Dr. Louis next week.    She is still doing dialysis.  She is getting through dialysis.  She has had a little bit of nausea but no vomiting..  She has had no leg swelling.  She has had no fever.  There is been no rashes.  Again, we will get an abdominal film on her just make sure there is no evidence of bowel obstruction.  When we last saw her, her ferritin was 133 with iron saturation 17%.  She has had no cough.  She does have some shortness of breath on occasion.  Overall, I will say her performance status is probably ECOG 2.   Wt Readings from Last 3 Encounters:  02/16/24 107 lb 1.9 oz (48.6 kg)  02/05/24 105 lb (47.6 kg)  02/02/24 107 lb 6.4 oz (48.7 kg)    Medications:  Allergies as of 02/16/2024       Reactions   Morphine  Anaphylaxis, Shortness Of Breath   Penicillins Hives   Tolerated ANCEF  on 04/30/20   Tramadol Anaphylaxis   Acetaminophen  Other (See Comments)   Alters insulin  pump readings   Amitriptyline Other (See Comments)   Severe headache/ out of body feeling   Codeine Other (See Comments)   Severe headaches/ out of body feeling   Ibuprofen Other (See Comments)   Messes up CGM reading on glucose monitor   Krill Oil Diarrhea, Itching, Nausea And Vomiting   Lactose Intolerance (gi) Diarrhea   Losartan Cough   Propoxyphene Other (See Comments)   Severe  headaches / out of body feeling   Shellfish Allergy Diarrhea, Nausea And Vomiting   Statins Other (See Comments)   Muscle pains Other reaction(s): Other   Sulfamethoxazole Other (See Comments)   Mouth ulcers   Sulfites Itching, Other (See Comments)   Mouth ulcers   Ace Inhibitors Other (See Comments)   Other Reaction(s): Unknown   Fluconazole    Other reaction(s): Contact Dermatitis (intolerance)   Norvasc  [amlodipine  Besylate] Swelling        Medication List        Accurate as of February 16, 2024  1:08 PM. If you have any questions, ask your nurse or doctor.          ALPHA-LIPOIC ACID PO Take 1 capsule by mouth daily.   ARANESP  IJ Inject as directed.   Armour Thyroid  60 MG tablet Generic drug: thyroid  Take 60 mg by mouth See admin instructions. Take 60 mg by mouth once daily on Sunday, Monday, Wednesday, and Friday.   aspirin  EC 325 MG tablet Take 1 tablet (325 mg total) by mouth daily.   b complex-vitamin c -folic acid  0.8 MG Tabs tablet Take 1 tablet by mouth daily.   carvedilol  12.5 MG tablet Commonly known as: COREG  TAKE 1 TABLET BY MOUTH TWICE  DAILY WITH A MEAL What changed:  when to take this additional instructions   cetirizine 10 MG tablet Commonly known as: ZYRTEC Take 10 mg by mouth at bedtime.   clobetasol  0.05 % external solution Commonly known as: TEMOVATE  Apply 1 Application topically 2 (two) times daily.   clopidogrel  75 MG tablet Commonly known as: Plavix  Take 1 tablet (75 mg total) by mouth daily.   dexamethasone  0.1 % ophthalmic solution Commonly known as: DECADRON  Place 1 drop into the left eye daily as needed (Left ear daily.).   diclofenac  Sodium 1 % Gel Commonly known as: VOLTAREN  Apply 1 Application topically daily as needed (pain).   diphenoxylate -atropine  2.5-0.025 MG tablet Commonly known as: LOMOTIL  TAKE 1 TABLET BY MOUTH 4 TIMES DAILY AS NEEDED FOR DIARRHEA OR  LOOSE  STOOLS What changed: See the new instructions.    esomeprazole 20 MG capsule Commonly known as: NEXIUM Take 20 mg by mouth daily.   fluocinonide  0.05 % external solution Commonly known as: LIDEX  Apply 1 Application topically daily. What changed: when to take this   hydrOXYzine  10 MG tablet Commonly known as: ATARAX  Take 1 tablet (10 mg total) by mouth every 6 (six) hours as needed for itching.   ketoconazole  2 % shampoo Commonly known as: NIZORAL  Apply 1 Application topically 2 (two) times a week.   ketorolac  0.5 % ophthalmic solution Commonly known as: ACULAR  Place 1 drop into both eyes 2 (two) times daily.   lidocaine -prilocaine  cream Commonly known as: EMLA  Apply 1 Application topically 3 (three) times a week.   Melatonin Gummies 2.5 MG Chew Chew 2.5 mg by mouth at bedtime as needed (sleep).   nitroGLYCERIN  0.4 MG/SPRAY spray Commonly known as: NITROLINGUAL  Place 1 spray under the tongue every 5 (five) minutes x 3 doses as needed. What changed: reasons to take this   NovoLOG  100 UNIT/ML injection Generic drug: insulin  aspart Inject 0-10 Units into the skin See admin instructions. VIA Insulin  pump   OVER THE COUNTER MEDICATION Take 1 capsule by mouth at bedtime. Conjugated linoleic acid   OVER THE COUNTER MEDICATION Take 3 tablets by mouth at bedtime. Magnesium  Threonate   oxyCODONE  5 MG immediate release tablet Commonly known as: Roxicodone  Take 1 tablet (5 mg total) by mouth every 8 (eight) hours as needed.   prednisoLONE  acetate 1 % ophthalmic suspension Commonly known as: PRED FORTE  Place 1 drop into both eyes 2 (two) times daily.   Premarin  vaginal cream Generic drug: conjugated estrogens  Place 1 applicator vaginally daily as needed (irritation).   torsemide  20 MG tablet Commonly known as: [name]  Take 2 tablets (40 mg total) by mouth daily. What changed:  how much to take when to take this reasons to take this   valACYclovir  500 MG tablet Commonly known as: VALTREX  Take 500 mg by mouth daily  as needed (Flair up).        Allergies:  Allergies  Allergen Reactions   Morphine  Anaphylaxis and Shortness Of Breath   Penicillins Hives    Tolerated ANCEF  on 04/30/20    Tramadol Anaphylaxis   Acetaminophen  Other (See Comments)    Alters insulin  pump readings    Amitriptyline Other (See Comments)    Severe headache/ out of body feeling   Codeine Other (See Comments)    Severe headaches/ out of body feeling    Ibuprofen Other (See Comments)    Messes up CGM reading on glucose monitor     Krill Oil Diarrhea, Itching and Nausea And Vomiting  Lactose Intolerance (Gi) Diarrhea   Losartan Cough   Propoxyphene Other (See Comments)    Severe headaches / out of body feeling    Shellfish Allergy Diarrhea and Nausea And Vomiting   Statins Other (See Comments)    Muscle pains Other reaction(s): Other   Sulfamethoxazole Other (See Comments)    Mouth ulcers    Sulfites Itching and Other (See Comments)    Mouth ulcers   Ace Inhibitors Other (See Comments)    Other Reaction(s): Unknown   Fluconazole     Other reaction(s): Contact Dermatitis (intolerance)   Norvasc  [Amlodipine  Besylate] Swelling    Past Medical History, Surgical history, Social history, and Family History were reviewed and updated.  Review of Systems: Review of Systems  Constitutional:  Positive for malaise/fatigue. Negative for chills and fever.  HENT: Negative.    Eyes:  Positive for blurred vision.  Respiratory:  Positive for shortness of breath (mild).   Cardiovascular:  Negative for leg swelling.  Gastrointestinal:  Negative for abdominal pain and diarrhea.  Genitourinary:  Negative for dysuria, flank pain, frequency, hematuria and urgency.  Musculoskeletal:  Positive for joint pain and myalgias.  Skin:  Negative for rash.  Neurological:  Positive for headaches.  Endo/Heme/Allergies: Negative.   Psychiatric/Behavioral: Negative.     Wt Readings from Last 3 Encounters:  02/16/24 107 lb 1.9 oz  (48.6 kg)  02/05/24 105 lb (47.6 kg)  02/02/24 107 lb 6.4 oz (48.7 kg)   Vital signs show a temperature 98.5.  Pulse 65.  Blood pressure 149/57.  Weight is 107 pounds.   Physical Exam Vitals reviewed.  Constitutional:      Comments: Gentle use of cane for ambulation   HENT:     Head: Normocephalic and atraumatic.  Eyes:     Pupils: Pupils are equal, round, and reactive to light.  Cardiovascular:     Rate and Rhythm: Normal rate and regular rhythm.     Heart sounds: Normal heart sounds.  Pulmonary:     Effort: Pulmonary effort is normal.     Breath sounds: Normal breath sounds.  Musculoskeletal:     Cervical back: Normal range of motion.     Right lower leg: No edema.     Left lower leg: No edema.  Lymphadenopathy:     Cervical: No cervical adenopathy.  Skin:    General: Skin is warm and dry.     Findings: No erythema or rash.  Neurological:     Mental Status: She is alert and oriented to person, place, and time. Mental status is at baseline.     Comments: Cranial nerves are intact  Ambulating slowly with cane  Psychiatric:        Behavior: Behavior normal.        Thought Content: Thought content normal.        Judgment: Judgment normal.     Lab Results  Component Value Date   WBC 7.2 02/16/2024   HGB 10.3 (L) 02/16/2024   HCT 32.3 (L) 02/16/2024   MCV 86.6 02/16/2024   PLT 383 02/16/2024   Lab Results  Component Value Date   FERRITIN 133 01/22/2024   IRON 56 01/22/2024   TIBC 322 01/22/2024   UIBC 266 01/22/2024   IRONPCTSAT 17 01/22/2024   Lab Results  Component Value Date   RETICCTPCT 0.7 02/16/2024   RBC 3.73 (L) 02/16/2024   RBC 3.70 (L) 02/16/2024   No results found for: KPAFRELGTCHN, LAMBDASER, KAPLAMBRATIO No results found for: IGGSERUM, IGA,  IGMSERUM No results found for: STEPHANY CARLOTA BENSON MARKEL EARLA JOANNIE DOC, MSPIKE, SPEI   Chemistry      Component Value Date/Time   NA 133 (L) 02/16/2024  1213   NA 138 07/17/2017 1128   K 4.0 02/16/2024 1213   CL 91 (L) 02/16/2024 1213   CO2 33 (H) 02/16/2024 1213   BUN 15 02/16/2024 1213   BUN 23 07/17/2017 1128   CREATININE 2.21 (H) 02/16/2024 1213   CREATININE 2.45 (H) 03/03/2022 1129      Component Value Date/Time   CALCIUM  9.0 02/16/2024 1213   CALCIUM  8.7 02/06/2023 0000   ALKPHOS 88 02/16/2024 1213   AST 20 02/16/2024 1213   ALT <5 02/16/2024 1213   BILITOT 0.3 02/16/2024 1213      Impression and Plan: Ms. Mathisen is a very pleasant 69 yo female with multifactorial anemia. She has CKD, HTN, DM1.   I know she is having a difficult time after the surgery.  Again, I am should Dr. Louis will be able to help her out.  We will see what the abdominal film shows.  She gets her Aranesp  today.  Her hemoglobin is down a little bit down sure is probably from being in the hospital.  We will plan to get her back in another month.   Maude JONELLE Crease, MD 1/23/20261:08 PM  "

## 2024-02-16 NOTE — Telephone Encounter (Signed)
 Advised via MyChart.

## 2024-02-17 ENCOUNTER — Ambulatory Visit: Payer: Self-pay | Admitting: Hematology & Oncology

## 2024-02-20 ENCOUNTER — Telehealth: Payer: Self-pay | Admitting: Hematology & Oncology

## 2024-02-20 NOTE — Telephone Encounter (Signed)
 Called to schedule infusion per inbasket. LVM to return call for scheduling.

## 2024-02-27 ENCOUNTER — Telehealth: Payer: Self-pay | Admitting: Hematology & Oncology

## 2024-02-27 ENCOUNTER — Other Ambulatory Visit (HOSPITAL_COMMUNITY): Payer: Self-pay | Admitting: Neurosurgery

## 2024-02-27 DIAGNOSIS — G910 Communicating hydrocephalus: Secondary | ICD-10-CM

## 2024-02-28 ENCOUNTER — Ambulatory Visit (HOSPITAL_COMMUNITY): Admission: RE | Admit: 2024-02-28 | Discharge: 2024-02-28 | Attending: Neurosurgery | Admitting: Neurosurgery

## 2024-02-28 DIAGNOSIS — G910 Communicating hydrocephalus: Secondary | ICD-10-CM

## 2024-02-29 ENCOUNTER — Other Ambulatory Visit (HOSPITAL_COMMUNITY): Payer: Self-pay | Admitting: Neurosurgery

## 2024-02-29 ENCOUNTER — Inpatient Hospital Stay

## 2024-02-29 DIAGNOSIS — G910 Communicating hydrocephalus: Secondary | ICD-10-CM

## 2024-03-06 ENCOUNTER — Inpatient Hospital Stay

## 2024-03-07 ENCOUNTER — Inpatient Hospital Stay

## 2024-03-08 ENCOUNTER — Ambulatory Visit (HOSPITAL_COMMUNITY)

## 2024-03-13 ENCOUNTER — Ambulatory Visit (HOSPITAL_COMMUNITY)

## 2024-03-20 ENCOUNTER — Inpatient Hospital Stay: Admitting: Hematology & Oncology

## 2024-03-20 ENCOUNTER — Inpatient Hospital Stay

## 2024-03-20 ENCOUNTER — Inpatient Hospital Stay: Attending: Hematology & Oncology
